# Patient Record
Sex: Female | Born: 1950 | Race: White | Hispanic: No | Marital: Married | Smoking: Former smoker
Health system: Southern US, Community
[De-identification: ages and names within clinical notes are randomized; demographics above are authoritative.]

## PROBLEM LIST (undated history)

## (undated) DIAGNOSIS — K219 Gastro-esophageal reflux disease without esophagitis: Secondary | ICD-10-CM

## (undated) DIAGNOSIS — J069 Acute upper respiratory infection, unspecified: Secondary | ICD-10-CM

## (undated) DIAGNOSIS — I10 Essential (primary) hypertension: Secondary | ICD-10-CM

## (undated) DIAGNOSIS — F1020 Alcohol dependence, uncomplicated: Secondary | ICD-10-CM

## (undated) DIAGNOSIS — E538 Deficiency of other specified B group vitamins: Secondary | ICD-10-CM

## (undated) DIAGNOSIS — IMO0001 Reserved for inherently not codable concepts without codable children: Secondary | ICD-10-CM

## (undated) DIAGNOSIS — F329 Major depressive disorder, single episode, unspecified: Secondary | ICD-10-CM

## (undated) DIAGNOSIS — G4700 Insomnia, unspecified: Secondary | ICD-10-CM

## (undated) DIAGNOSIS — E871 Hypo-osmolality and hyponatremia: Secondary | ICD-10-CM

## (undated) DIAGNOSIS — K5792 Diverticulitis of intestine, part unspecified, without perforation or abscess without bleeding: Secondary | ICD-10-CM

## (undated) DIAGNOSIS — M1990 Unspecified osteoarthritis, unspecified site: Secondary | ICD-10-CM

## (undated) DIAGNOSIS — T39395A Adverse effect of other nonsteroidal anti-inflammatory drugs [NSAID], initial encounter: Secondary | ICD-10-CM

## (undated) DIAGNOSIS — R41841 Cognitive communication deficit: Secondary | ICD-10-CM

## (undated) DIAGNOSIS — K266 Chronic or unspecified duodenal ulcer with both hemorrhage and perforation: Secondary | ICD-10-CM

## (undated) DIAGNOSIS — K269 Duodenal ulcer, unspecified as acute or chronic, without hemorrhage or perforation: Secondary | ICD-10-CM

## (undated) DIAGNOSIS — F32A Depression, unspecified: Secondary | ICD-10-CM

## (undated) DIAGNOSIS — I7300 Raynaud's syndrome without gangrene: Secondary | ICD-10-CM

## (undated) DIAGNOSIS — D509 Iron deficiency anemia, unspecified: Secondary | ICD-10-CM

## (undated) DIAGNOSIS — F411 Generalized anxiety disorder: Secondary | ICD-10-CM

## (undated) DIAGNOSIS — B029 Zoster without complications: Secondary | ICD-10-CM

## (undated) DIAGNOSIS — K5730 Diverticulosis of large intestine without perforation or abscess without bleeding: Secondary | ICD-10-CM

## (undated) DIAGNOSIS — Z5189 Encounter for other specified aftercare: Secondary | ICD-10-CM

## (undated) DIAGNOSIS — M810 Age-related osteoporosis without current pathological fracture: Secondary | ICD-10-CM

## (undated) DIAGNOSIS — R51 Headache: Secondary | ICD-10-CM

## (undated) HISTORY — DX: Age-related osteoporosis without current pathological fracture: M81.0

## (undated) HISTORY — DX: Duodenal ulcer, unspecified as acute or chronic, without hemorrhage or perforation: K26.9

## (undated) HISTORY — PX: UPPER GASTROINTESTINAL ENDOSCOPY: SHX188

## (undated) HISTORY — DX: Adverse effect of other nonsteroidal anti-inflammatory drugs (NSAID), initial encounter: T39.395A

## (undated) HISTORY — DX: Insomnia, unspecified: G47.00

## (undated) HISTORY — DX: Deficiency of other specified B group vitamins: E53.8

## (undated) HISTORY — DX: Iron deficiency anemia, unspecified: D50.9

## (undated) HISTORY — DX: Diverticulitis of intestine, part unspecified, without perforation or abscess without bleeding: K57.92

## (undated) HISTORY — DX: Generalized anxiety disorder: F41.1

## (undated) HISTORY — DX: Gastro-esophageal reflux disease without esophagitis: K21.9

## (undated) HISTORY — DX: Hypo-osmolality and hyponatremia: E87.1

## (undated) HISTORY — DX: Alcohol dependence, uncomplicated: F10.20

## (undated) HISTORY — DX: Essential (primary) hypertension: I10

## (undated) HISTORY — DX: Chronic or unspecified duodenal ulcer with both hemorrhage and perforation: K26.6

## (undated) HISTORY — DX: Raynaud's syndrome without gangrene: I73.00

## (undated) HISTORY — DX: Diverticulosis of large intestine without perforation or abscess without bleeding: K57.30

## (undated) HISTORY — DX: Headache: R51

## (undated) HISTORY — DX: Zoster without complications: B02.9

---
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
## 1975-07-22 HISTORY — PX: TUBAL LIGATION: SHX77

## 1999-03-26 ENCOUNTER — Encounter: Payer: Self-pay | Admitting: Internal Medicine

## 1999-03-26 ENCOUNTER — Ambulatory Visit (HOSPITAL_COMMUNITY): Admission: RE | Admit: 1999-03-26 | Discharge: 1999-03-26 | Payer: Self-pay | Admitting: Internal Medicine

## 2000-12-23 ENCOUNTER — Inpatient Hospital Stay (HOSPITAL_COMMUNITY): Admission: EM | Admit: 2000-12-23 | Discharge: 2000-12-24 | Payer: Self-pay | Admitting: Emergency Medicine

## 2000-12-23 ENCOUNTER — Encounter: Payer: Self-pay | Admitting: Emergency Medicine

## 2001-02-25 ENCOUNTER — Emergency Department (HOSPITAL_COMMUNITY): Admission: EM | Admit: 2001-02-25 | Discharge: 2001-02-26 | Payer: Self-pay | Admitting: Emergency Medicine

## 2001-05-14 ENCOUNTER — Emergency Department (HOSPITAL_COMMUNITY): Admission: EM | Admit: 2001-05-14 | Discharge: 2001-05-15 | Payer: Self-pay | Admitting: Emergency Medicine

## 2004-05-28 ENCOUNTER — Ambulatory Visit: Payer: Self-pay | Admitting: Internal Medicine

## 2004-07-05 ENCOUNTER — Ambulatory Visit: Payer: Self-pay | Admitting: Internal Medicine

## 2004-07-21 DIAGNOSIS — K266 Chronic or unspecified duodenal ulcer with both hemorrhage and perforation: Secondary | ICD-10-CM

## 2004-07-21 HISTORY — DX: Chronic or unspecified duodenal ulcer with both hemorrhage and perforation: K26.6

## 2004-07-30 ENCOUNTER — Ambulatory Visit: Payer: Self-pay | Admitting: Internal Medicine

## 2004-07-30 ENCOUNTER — Inpatient Hospital Stay (HOSPITAL_COMMUNITY): Admission: EM | Admit: 2004-07-30 | Discharge: 2004-08-12 | Payer: Self-pay | Admitting: *Deleted

## 2004-08-03 ENCOUNTER — Encounter (INDEPENDENT_AMBULATORY_CARE_PROVIDER_SITE_OTHER): Payer: Self-pay | Admitting: *Deleted

## 2004-08-03 HISTORY — PX: EXPLORATORY LAPAROTOMY: SUR591

## 2004-08-26 ENCOUNTER — Ambulatory Visit: Payer: Self-pay | Admitting: Internal Medicine

## 2004-10-20 ENCOUNTER — Ambulatory Visit: Payer: Self-pay | Admitting: Internal Medicine

## 2004-10-20 ENCOUNTER — Encounter (INDEPENDENT_AMBULATORY_CARE_PROVIDER_SITE_OTHER): Payer: Self-pay | Admitting: Specialist

## 2004-10-20 ENCOUNTER — Inpatient Hospital Stay (HOSPITAL_COMMUNITY): Admission: EM | Admit: 2004-10-20 | Discharge: 2004-11-03 | Payer: Self-pay | Admitting: Emergency Medicine

## 2004-10-20 HISTORY — PX: EXPLORATORY LAPAROTOMY: SUR591

## 2004-11-23 ENCOUNTER — Emergency Department (HOSPITAL_COMMUNITY): Admission: EM | Admit: 2004-11-23 | Discharge: 2004-11-23 | Payer: Self-pay | Admitting: Emergency Medicine

## 2004-11-27 ENCOUNTER — Ambulatory Visit: Payer: Self-pay | Admitting: Internal Medicine

## 2004-12-18 ENCOUNTER — Ambulatory Visit: Payer: Self-pay | Admitting: Internal Medicine

## 2005-01-31 ENCOUNTER — Ambulatory Visit: Payer: Self-pay | Admitting: Internal Medicine

## 2005-02-28 ENCOUNTER — Ambulatory Visit: Payer: Self-pay | Admitting: Internal Medicine

## 2005-03-31 ENCOUNTER — Ambulatory Visit: Payer: Self-pay | Admitting: Internal Medicine

## 2005-05-12 ENCOUNTER — Ambulatory Visit: Payer: Self-pay | Admitting: Internal Medicine

## 2005-05-21 ENCOUNTER — Ambulatory Visit: Payer: Self-pay | Admitting: Internal Medicine

## 2005-06-30 ENCOUNTER — Ambulatory Visit: Payer: Self-pay | Admitting: Internal Medicine

## 2005-08-11 ENCOUNTER — Ambulatory Visit: Payer: Self-pay | Admitting: Internal Medicine

## 2005-09-25 ENCOUNTER — Ambulatory Visit: Payer: Self-pay | Admitting: Internal Medicine

## 2005-10-20 ENCOUNTER — Ambulatory Visit: Payer: Self-pay | Admitting: Internal Medicine

## 2005-11-10 ENCOUNTER — Ambulatory Visit: Payer: Self-pay | Admitting: Internal Medicine

## 2006-03-20 ENCOUNTER — Ambulatory Visit: Payer: Self-pay | Admitting: Internal Medicine

## 2006-05-11 ENCOUNTER — Ambulatory Visit: Payer: Self-pay | Admitting: Internal Medicine

## 2006-07-09 ENCOUNTER — Ambulatory Visit: Payer: Self-pay | Admitting: Internal Medicine

## 2006-07-27 ENCOUNTER — Ambulatory Visit: Payer: Self-pay | Admitting: Internal Medicine

## 2006-08-20 ENCOUNTER — Ambulatory Visit: Payer: Self-pay | Admitting: Internal Medicine

## 2006-08-20 LAB — CONVERTED CEMR LAB
BUN: 6 mg/dL (ref 6–23)
CO2: 30 meq/L (ref 19–32)
Calcium: 9.6 mg/dL (ref 8.4–10.5)
Chloride: 94 meq/L — ABNORMAL LOW (ref 96–112)
Creatinine, Ser: 0.5 mg/dL (ref 0.4–1.2)
GFR calc Af Amer: 165 mL/min
GFR calc non Af Amer: 137 mL/min
Glucose, Bld: 103 mg/dL — ABNORMAL HIGH (ref 70–99)
Potassium: 5.2 meq/L — ABNORMAL HIGH (ref 3.5–5.1)
Sodium: 132 meq/L — ABNORMAL LOW (ref 135–145)

## 2006-10-19 ENCOUNTER — Ambulatory Visit: Payer: Self-pay | Admitting: Internal Medicine

## 2006-11-23 ENCOUNTER — Ambulatory Visit: Payer: Self-pay | Admitting: Internal Medicine

## 2006-11-27 ENCOUNTER — Encounter: Admission: RE | Admit: 2006-11-27 | Discharge: 2006-11-27 | Payer: Self-pay | Admitting: Internal Medicine

## 2006-11-27 ENCOUNTER — Ambulatory Visit: Payer: Self-pay | Admitting: Internal Medicine

## 2006-11-27 ENCOUNTER — Ambulatory Visit: Payer: Self-pay | Admitting: Cardiology

## 2006-11-27 LAB — CONVERTED CEMR LAB
ALT: 19 units/L (ref 0–40)
AST: 29 units/L (ref 0–37)
Albumin: 4.1 g/dL (ref 3.5–5.2)
Alkaline Phosphatase: 83 units/L (ref 39–117)
BUN: 5 mg/dL — ABNORMAL LOW (ref 6–23)
Basophils Absolute: 0 10*3/uL (ref 0.0–0.1)
Basophils Relative: 0.1 % (ref 0.0–1.0)
Bilirubin, Direct: 0.2 mg/dL (ref 0.0–0.3)
CO2: 29 meq/L (ref 19–32)
Calcium: 8.9 mg/dL (ref 8.4–10.5)
Chloride: 85 meq/L — ABNORMAL LOW (ref 96–112)
Cholesterol: 192 mg/dL (ref 0–200)
Creatinine, Ser: 0.6 mg/dL (ref 0.4–1.2)
Eosinophils Absolute: 0 10*3/uL (ref 0.0–0.6)
Eosinophils Relative: 0 % (ref 0.0–5.0)
GFR calc Af Amer: 134 mL/min
GFR calc non Af Amer: 111 mL/min
Glucose, Bld: 106 mg/dL — ABNORMAL HIGH (ref 70–99)
HCT: 36 % (ref 36.0–46.0)
HDL: 88.8 mg/dL (ref >39.0)
Hemoglobin: 12.4 g/dL (ref 12.0–15.0)
LDL Cholesterol: 93 mg/dL (ref 0–99)
Lymphocytes Relative: 14 % (ref 12.0–46.0)
MCHC: 34.4 g/dL (ref 30.0–36.0)
MCV: 98.6 fL (ref 78.0–100.0)
Monocytes Absolute: 0.4 10*3/uL (ref 0.2–0.7)
Monocytes Relative: 7.1 % (ref 3.0–11.0)
Neutro Abs: 4.2 10*3/uL (ref 1.4–7.7)
Neutrophils Relative %: 78.8 % — ABNORMAL HIGH (ref 43.0–77.0)
Platelets: 346 10*3/uL (ref 150–400)
Potassium: 3.7 meq/L (ref 3.5–5.1)
RBC: 3.65 M/uL — ABNORMAL LOW (ref 3.87–5.11)
RDW: 11.6 % (ref 11.5–14.6)
Sodium: 121 meq/L — ABNORMAL LOW (ref 135–145)
Total Bilirubin: 0.9 mg/dL (ref 0.3–1.2)
Total CHOL/HDL Ratio: 2.2
Total Protein: 6.5 g/dL (ref 6.0–8.3)
Triglycerides: 49 mg/dL (ref 0–149)
VLDL: 10 mg/dL (ref 0–40)
WBC: 5.3 10*3/uL (ref 4.5–10.5)

## 2006-12-04 ENCOUNTER — Ambulatory Visit: Payer: Self-pay | Admitting: Internal Medicine

## 2007-01-18 ENCOUNTER — Ambulatory Visit: Payer: Self-pay

## 2007-01-18 ENCOUNTER — Encounter (INDEPENDENT_AMBULATORY_CARE_PROVIDER_SITE_OTHER): Payer: Self-pay | Admitting: Neurology

## 2007-01-21 DIAGNOSIS — M810 Age-related osteoporosis without current pathological fracture: Secondary | ICD-10-CM

## 2007-01-21 DIAGNOSIS — F411 Generalized anxiety disorder: Secondary | ICD-10-CM

## 2007-01-21 DIAGNOSIS — F1020 Alcohol dependence, uncomplicated: Secondary | ICD-10-CM

## 2007-01-21 HISTORY — DX: Generalized anxiety disorder: F41.1

## 2007-01-21 HISTORY — DX: Age-related osteoporosis without current pathological fracture: M81.0

## 2007-01-25 ENCOUNTER — Encounter: Admission: RE | Admit: 2007-01-25 | Discharge: 2007-01-25 | Payer: Self-pay | Admitting: Neurology

## 2007-04-19 ENCOUNTER — Encounter: Payer: Self-pay | Admitting: Internal Medicine

## 2007-05-07 ENCOUNTER — Encounter: Payer: Self-pay | Admitting: Internal Medicine

## 2007-05-20 ENCOUNTER — Ambulatory Visit: Payer: Self-pay | Admitting: Internal Medicine

## 2007-05-27 ENCOUNTER — Telehealth: Payer: Self-pay | Admitting: Internal Medicine

## 2007-05-31 ENCOUNTER — Ambulatory Visit: Payer: Self-pay | Admitting: Internal Medicine

## 2007-05-31 DIAGNOSIS — R51 Headache: Secondary | ICD-10-CM | POA: Insufficient documentation

## 2007-05-31 DIAGNOSIS — R519 Headache, unspecified: Secondary | ICD-10-CM | POA: Insufficient documentation

## 2007-05-31 DIAGNOSIS — G4700 Insomnia, unspecified: Secondary | ICD-10-CM

## 2007-05-31 HISTORY — DX: Headache: R51

## 2007-05-31 HISTORY — DX: Insomnia, unspecified: G47.00

## 2007-06-01 LAB — CONVERTED CEMR LAB
BUN: 10 mg/dL (ref 6–23)
Basophils Absolute: 0 10*3/uL (ref 0.0–0.1)
Basophils Relative: 0.5 % (ref 0.0–1.0)
CO2: 29 meq/L (ref 19–32)
Calcium: 9.4 mg/dL (ref 8.4–10.5)
Chloride: 90 meq/L — ABNORMAL LOW (ref 96–112)
Creatinine, Ser: 0.6 mg/dL (ref 0.4–1.2)
Eosinophils Absolute: 0.1 10*3/uL (ref 0.0–0.6)
Eosinophils Relative: 0.9 % (ref 0.0–5.0)
GFR calc Af Amer: 133 mL/min
GFR calc non Af Amer: 110 mL/min
Glucose, Bld: 94 mg/dL (ref 70–99)
HCT: 36.6 % (ref 36.0–46.0)
Hemoglobin: 12.8 g/dL (ref 12.0–15.0)
Lymphocytes Relative: 27.1 % (ref 12.0–46.0)
MCHC: 35 g/dL (ref 30.0–36.0)
MCV: 97.4 fL (ref 78.0–100.0)
Monocytes Absolute: 0.5 10*3/uL (ref 0.2–0.7)
Monocytes Relative: 7.8 % (ref 3.0–11.0)
Neutro Abs: 4.1 10*3/uL (ref 1.4–7.7)
Neutrophils Relative %: 63.7 % (ref 43.0–77.0)
Platelets: 330 10*3/uL (ref 150–400)
Potassium: 5 meq/L (ref 3.5–5.1)
RBC: 3.75 M/uL — ABNORMAL LOW (ref 3.87–5.11)
RDW: 12.6 % (ref 11.5–14.6)
Sodium: 127 meq/L — ABNORMAL LOW (ref 135–145)
TSH: 0.77 microintl units/mL (ref 0.35–5.50)
WBC: 6.5 10*3/uL (ref 4.5–10.5)

## 2007-06-02 ENCOUNTER — Telehealth: Payer: Self-pay | Admitting: Internal Medicine

## 2007-06-16 ENCOUNTER — Ambulatory Visit: Payer: Self-pay | Admitting: Endocrinology

## 2007-06-16 ENCOUNTER — Inpatient Hospital Stay (HOSPITAL_COMMUNITY): Admission: EM | Admit: 2007-06-16 | Discharge: 2007-06-19 | Payer: Self-pay | Admitting: Emergency Medicine

## 2007-06-18 ENCOUNTER — Encounter: Payer: Self-pay | Admitting: Internal Medicine

## 2007-06-18 HISTORY — PX: COLONOSCOPY: SHX174

## 2007-06-22 ENCOUNTER — Ambulatory Visit: Payer: Self-pay | Admitting: Internal Medicine

## 2007-06-23 ENCOUNTER — Ambulatory Visit: Payer: Self-pay | Admitting: Internal Medicine

## 2007-06-23 DIAGNOSIS — I10 Essential (primary) hypertension: Secondary | ICD-10-CM | POA: Insufficient documentation

## 2007-06-23 DIAGNOSIS — I7300 Raynaud's syndrome without gangrene: Secondary | ICD-10-CM

## 2007-06-23 HISTORY — DX: Essential (primary) hypertension: I10

## 2007-06-23 HISTORY — DX: Raynaud's syndrome without gangrene: I73.00

## 2007-07-07 ENCOUNTER — Ambulatory Visit: Payer: Self-pay | Admitting: Internal Medicine

## 2007-07-09 ENCOUNTER — Ambulatory Visit: Payer: Self-pay | Admitting: Internal Medicine

## 2007-07-09 ENCOUNTER — Encounter: Payer: Self-pay | Admitting: Internal Medicine

## 2007-07-09 DIAGNOSIS — K5730 Diverticulosis of large intestine without perforation or abscess without bleeding: Secondary | ICD-10-CM | POA: Insufficient documentation

## 2007-07-09 DIAGNOSIS — D509 Iron deficiency anemia, unspecified: Secondary | ICD-10-CM

## 2007-07-09 HISTORY — DX: Diverticulosis of large intestine without perforation or abscess without bleeding: K57.30

## 2007-07-09 LAB — CONVERTED CEMR LAB
ALT: 14 units/L (ref 0–35)
Albumin: 4 g/dL (ref 3.5–5.2)
Alkaline Phosphatase: 79 units/L (ref 39–117)
Basophils Absolute: 0.1 10*3/uL (ref 0.0–0.1)
Basophils Relative: 1 % (ref 0.0–1.0)
HCT: 34.2 % — ABNORMAL LOW (ref 36.0–46.0)
Hemoglobin: 11.7 g/dL — ABNORMAL LOW (ref 12.0–15.0)
MCHC: 34.1 g/dL (ref 30.0–36.0)
Monocytes Absolute: 0.4 10*3/uL (ref 0.2–0.7)
Monocytes Relative: 5.5 % (ref 3.0–11.0)
Neutrophils Relative %: 77.7 % — ABNORMAL HIGH (ref 43.0–77.0)
RBC: 3.51 M/uL — ABNORMAL LOW (ref 3.87–5.11)
RDW: 12.7 % (ref 11.5–14.6)
Saturation Ratios: 16.4 % — ABNORMAL LOW (ref 20.0–50.0)
Total Protein: 6.2 g/dL (ref 6.0–8.3)
Transferrin: 317.4 mg/dL (ref >=212.0)
Vitamin B-12: 341 pg/mL (ref 211–911)

## 2007-09-03 ENCOUNTER — Ambulatory Visit: Payer: Self-pay | Admitting: Internal Medicine

## 2007-09-03 LAB — CONVERTED CEMR LAB
Basophils Absolute: 0 10*3/uL (ref 0.0–0.1)
Basophils Relative: 0.7 % (ref 0.0–1.0)
CO2: 32 meq/L (ref 19–32)
Chloride: 98 meq/L (ref 96–112)
Creatinine, Ser: 0.5 mg/dL (ref 0.4–1.2)
Eosinophils Relative: 1.4 % (ref 0.0–5.0)
Ferritin: 5.2 ng/mL — ABNORMAL LOW (ref 10.0–291.0)
Glucose, Bld: 92 mg/dL (ref 70–99)
HCT: 40.4 % (ref 36.0–46.0)
Hemoglobin: 13.3 g/dL (ref 12.0–15.0)
Monocytes Absolute: 0.3 10*3/uL (ref 0.2–0.7)
Neutrophils Relative %: 61.8 % (ref 43.0–77.0)
Potassium: 4.7 meq/L (ref 3.5–5.1)
RBC: 4.09 M/uL (ref 3.87–5.11)
RDW: 13.2 % (ref 11.5–14.6)
Sodium: 136 meq/L (ref 135–145)
WBC: 7 10*3/uL (ref 4.5–10.5)

## 2007-09-10 ENCOUNTER — Ambulatory Visit: Payer: Self-pay | Admitting: Cardiology

## 2007-09-15 ENCOUNTER — Ambulatory Visit: Payer: Self-pay | Admitting: Internal Medicine

## 2007-09-16 ENCOUNTER — Ambulatory Visit: Payer: Self-pay | Admitting: Internal Medicine

## 2007-09-16 ENCOUNTER — Encounter: Payer: Self-pay | Admitting: Internal Medicine

## 2007-09-21 ENCOUNTER — Encounter (HOSPITAL_COMMUNITY): Admission: RE | Admit: 2007-09-21 | Discharge: 2007-12-06 | Payer: Self-pay | Admitting: Internal Medicine

## 2008-03-13 ENCOUNTER — Encounter: Admission: RE | Admit: 2008-03-13 | Discharge: 2008-03-13 | Payer: Self-pay | Admitting: Neurosurgery

## 2008-03-16 ENCOUNTER — Encounter: Payer: Self-pay | Admitting: Internal Medicine

## 2008-03-23 ENCOUNTER — Ambulatory Visit: Payer: Self-pay | Admitting: Internal Medicine

## 2008-04-20 ENCOUNTER — Ambulatory Visit: Payer: Self-pay | Admitting: Internal Medicine

## 2008-05-21 ENCOUNTER — Emergency Department (HOSPITAL_COMMUNITY): Admission: EM | Admit: 2008-05-21 | Discharge: 2008-05-21 | Payer: Self-pay | Admitting: Emergency Medicine

## 2008-08-21 ENCOUNTER — Ambulatory Visit: Payer: Self-pay | Admitting: Internal Medicine

## 2008-08-29 ENCOUNTER — Ambulatory Visit: Payer: Self-pay | Admitting: Internal Medicine

## 2008-08-30 LAB — CONVERTED CEMR LAB
ALT: 13 units/L (ref 0–35)
Amylase: 83 units/L (ref 27–131)
Eosinophils Absolute: 0 10*3/uL (ref 0.0–0.7)
HCT: 39.9 % (ref 36.0–46.0)
Hemoglobin: 14 g/dL (ref 12.0–15.0)
MCV: 99.1 fL (ref 78.0–100.0)
Monocytes Absolute: 0.5 10*3/uL (ref 0.1–1.0)
Monocytes Relative: 6 % (ref 3.0–12.0)
Neutro Abs: 7.2 10*3/uL (ref 1.4–7.7)
RDW: 13.5 % (ref 11.5–14.6)
Total Bilirubin: 1 mg/dL (ref 0.3–1.2)
Total Protein: 6.2 g/dL (ref 6.0–8.3)

## 2008-08-31 ENCOUNTER — Ambulatory Visit: Payer: Self-pay | Admitting: Internal Medicine

## 2008-09-11 ENCOUNTER — Encounter: Payer: Self-pay | Admitting: Internal Medicine

## 2008-09-11 ENCOUNTER — Telehealth: Payer: Self-pay | Admitting: Internal Medicine

## 2008-09-25 ENCOUNTER — Telehealth: Payer: Self-pay | Admitting: Internal Medicine

## 2008-09-26 ENCOUNTER — Ambulatory Visit: Payer: Self-pay | Admitting: Internal Medicine

## 2008-09-26 ENCOUNTER — Encounter (INDEPENDENT_AMBULATORY_CARE_PROVIDER_SITE_OTHER): Payer: Self-pay | Admitting: *Deleted

## 2008-10-02 ENCOUNTER — Ambulatory Visit: Payer: Self-pay | Admitting: Internal Medicine

## 2008-11-19 ENCOUNTER — Emergency Department (HOSPITAL_COMMUNITY): Admission: EM | Admit: 2008-11-19 | Discharge: 2008-11-19 | Payer: Self-pay | Admitting: Emergency Medicine

## 2008-11-20 ENCOUNTER — Telehealth: Payer: Self-pay | Admitting: Internal Medicine

## 2008-11-21 ENCOUNTER — Ambulatory Visit: Payer: Self-pay | Admitting: Internal Medicine

## 2008-11-21 DIAGNOSIS — R109 Unspecified abdominal pain: Secondary | ICD-10-CM | POA: Insufficient documentation

## 2008-12-06 ENCOUNTER — Telehealth: Payer: Self-pay | Admitting: Internal Medicine

## 2008-12-06 ENCOUNTER — Encounter: Payer: Self-pay | Admitting: Internal Medicine

## 2008-12-15 ENCOUNTER — Telehealth: Payer: Self-pay | Admitting: Internal Medicine

## 2008-12-15 ENCOUNTER — Emergency Department (HOSPITAL_COMMUNITY): Admission: EM | Admit: 2008-12-15 | Discharge: 2008-12-15 | Payer: Self-pay | Admitting: Emergency Medicine

## 2008-12-15 ENCOUNTER — Encounter: Payer: Self-pay | Admitting: Internal Medicine

## 2008-12-19 ENCOUNTER — Ambulatory Visit: Payer: Self-pay | Admitting: Gastroenterology

## 2008-12-19 DIAGNOSIS — R112 Nausea with vomiting, unspecified: Secondary | ICD-10-CM

## 2008-12-19 DIAGNOSIS — K269 Duodenal ulcer, unspecified as acute or chronic, without hemorrhage or perforation: Secondary | ICD-10-CM | POA: Insufficient documentation

## 2008-12-21 ENCOUNTER — Encounter (INDEPENDENT_AMBULATORY_CARE_PROVIDER_SITE_OTHER): Payer: Self-pay | Admitting: *Deleted

## 2008-12-21 ENCOUNTER — Ambulatory Visit: Payer: Self-pay | Admitting: Internal Medicine

## 2008-12-25 ENCOUNTER — Encounter: Payer: Self-pay | Admitting: Internal Medicine

## 2008-12-25 LAB — CONVERTED CEMR LAB
Eosinophils Relative: 1.1 % (ref 0.0–5.0)
Folate: >20 ng/mL
HCT: 29.2 % — ABNORMAL LOW (ref 36.0–46.0)
Iron: 16 ug/dL — ABNORMAL LOW (ref 42–145)
Monocytes Relative: 2 % — ABNORMAL LOW (ref 3.0–12.0)
Neutrophils Relative %: 78.1 % — ABNORMAL HIGH (ref 43.0–77.0)
Platelets: 408 10*3/uL — ABNORMAL HIGH (ref 150.0–400.0)
RBC: 3.37 M/uL — ABNORMAL LOW (ref 3.87–5.11)
Transferrin: 326.8 mg/dL (ref 212.0–360.0)
Vitamin B-12: 725 pg/mL (ref 211–911)
WBC: 6.9 10*3/uL (ref 4.5–10.5)

## 2009-01-03 ENCOUNTER — Encounter (HOSPITAL_COMMUNITY): Admission: RE | Admit: 2009-01-03 | Discharge: 2009-01-17 | Payer: Self-pay | Admitting: Internal Medicine

## 2009-01-03 ENCOUNTER — Telehealth: Payer: Self-pay | Admitting: Internal Medicine

## 2009-01-08 ENCOUNTER — Ambulatory Visit: Payer: Self-pay | Admitting: Internal Medicine

## 2009-01-08 ENCOUNTER — Telehealth: Payer: Self-pay | Admitting: Internal Medicine

## 2009-01-09 ENCOUNTER — Telehealth: Payer: Self-pay | Admitting: Internal Medicine

## 2009-01-10 ENCOUNTER — Encounter: Payer: Self-pay | Admitting: Internal Medicine

## 2009-01-17 ENCOUNTER — Telehealth: Payer: Self-pay | Admitting: Internal Medicine

## 2009-01-21 ENCOUNTER — Inpatient Hospital Stay (HOSPITAL_COMMUNITY): Admission: EM | Admit: 2009-01-21 | Discharge: 2009-01-26 | Payer: Self-pay | Admitting: Emergency Medicine

## 2009-01-30 ENCOUNTER — Telehealth: Payer: Self-pay | Admitting: Internal Medicine

## 2009-01-31 ENCOUNTER — Telehealth: Payer: Self-pay | Admitting: Internal Medicine

## 2009-02-02 ENCOUNTER — Ambulatory Visit: Payer: Self-pay | Admitting: Family Medicine

## 2009-02-02 DIAGNOSIS — F0781 Postconcussional syndrome: Secondary | ICD-10-CM | POA: Insufficient documentation

## 2009-02-02 DIAGNOSIS — E871 Hypo-osmolality and hyponatremia: Secondary | ICD-10-CM

## 2009-02-02 DIAGNOSIS — F1010 Alcohol abuse, uncomplicated: Secondary | ICD-10-CM | POA: Insufficient documentation

## 2009-02-02 DIAGNOSIS — F1020 Alcohol dependence, uncomplicated: Secondary | ICD-10-CM

## 2009-02-02 HISTORY — DX: Hypo-osmolality and hyponatremia: E87.1

## 2009-02-02 HISTORY — DX: Alcohol dependence, uncomplicated: F10.20

## 2009-02-04 ENCOUNTER — Ambulatory Visit: Payer: Self-pay | Admitting: Internal Medicine

## 2009-02-04 ENCOUNTER — Inpatient Hospital Stay (HOSPITAL_COMMUNITY): Admission: EM | Admit: 2009-02-04 | Discharge: 2009-02-08 | Payer: Self-pay | Admitting: Emergency Medicine

## 2009-02-05 ENCOUNTER — Telehealth: Payer: Self-pay | Admitting: Internal Medicine

## 2009-02-05 ENCOUNTER — Telehealth: Payer: Self-pay | Admitting: Family Medicine

## 2009-02-05 LAB — CONVERTED CEMR LAB
BUN: 7 mg/dL (ref 6–23)
Calcium: 8.7 mg/dL (ref 8.4–10.5)
Creatinine, Ser: 0.4 mg/dL (ref 0.4–1.2)
GFR calc non Af Amer: 174.91 mL/min (ref >60)

## 2009-02-12 ENCOUNTER — Telehealth: Payer: Self-pay | Admitting: Internal Medicine

## 2009-02-27 ENCOUNTER — Ambulatory Visit: Payer: Self-pay | Admitting: Internal Medicine

## 2009-02-28 ENCOUNTER — Ambulatory Visit: Payer: Self-pay | Admitting: Internal Medicine

## 2009-03-02 LAB — CONVERTED CEMR LAB
Calcium: 8.8 mg/dL (ref 8.4–10.5)
GFR calc non Af Amer: 174.87 mL/min (ref >60)
Sodium: 139 meq/L (ref 135–145)

## 2009-05-05 ENCOUNTER — Emergency Department (HOSPITAL_COMMUNITY): Admission: EM | Admit: 2009-05-05 | Discharge: 2009-05-05 | Payer: Self-pay | Admitting: Emergency Medicine

## 2009-06-22 ENCOUNTER — Ambulatory Visit: Payer: Self-pay | Admitting: Internal Medicine

## 2009-08-01 ENCOUNTER — Ambulatory Visit: Payer: Self-pay | Admitting: Internal Medicine

## 2009-08-01 DIAGNOSIS — J069 Acute upper respiratory infection, unspecified: Secondary | ICD-10-CM | POA: Insufficient documentation

## 2009-09-25 ENCOUNTER — Telehealth: Payer: Self-pay | Admitting: Internal Medicine

## 2009-09-26 ENCOUNTER — Ambulatory Visit: Payer: Self-pay | Admitting: Internal Medicine

## 2009-09-26 DIAGNOSIS — K912 Postsurgical malabsorption, not elsewhere classified: Secondary | ICD-10-CM

## 2009-09-28 LAB — CONVERTED CEMR LAB
Basophils Absolute: 0.1 10*3/uL (ref 0.0–0.1)
Hemoglobin: 10 g/dL — ABNORMAL LOW (ref 12.0–15.0)
Lymphocytes Relative: 24.6 % (ref 12.0–46.0)
Monocytes Relative: 11.7 % (ref 3.0–12.0)
Neutro Abs: 4.1 10*3/uL (ref 1.4–7.7)
RBC: 3.12 M/uL — ABNORMAL LOW (ref 3.87–5.11)
RDW: 13.9 % (ref 11.5–14.6)

## 2009-10-02 ENCOUNTER — Ambulatory Visit: Payer: Self-pay | Admitting: Internal Medicine

## 2009-10-02 DIAGNOSIS — E538 Deficiency of other specified B group vitamins: Secondary | ICD-10-CM | POA: Insufficient documentation

## 2009-10-02 HISTORY — DX: Deficiency of other specified B group vitamins: E53.8

## 2009-10-08 ENCOUNTER — Encounter (HOSPITAL_COMMUNITY): Admission: RE | Admit: 2009-10-08 | Discharge: 2010-01-06 | Payer: Self-pay | Admitting: Internal Medicine

## 2009-10-08 ENCOUNTER — Encounter: Payer: Self-pay | Admitting: Internal Medicine

## 2010-01-02 ENCOUNTER — Ambulatory Visit: Payer: Self-pay | Admitting: Internal Medicine

## 2010-03-15 ENCOUNTER — Ambulatory Visit: Payer: Self-pay | Admitting: Internal Medicine

## 2010-03-26 ENCOUNTER — Ambulatory Visit: Payer: Self-pay | Admitting: Internal Medicine

## 2010-05-20 ENCOUNTER — Telehealth: Payer: Self-pay | Admitting: Internal Medicine

## 2010-05-21 ENCOUNTER — Telehealth: Payer: Self-pay | Admitting: Internal Medicine

## 2010-05-21 ENCOUNTER — Ambulatory Visit: Payer: Self-pay | Admitting: Internal Medicine

## 2010-05-21 LAB — CONVERTED CEMR LAB
Basophils Relative: 0.8 % (ref 0.0–3.0)
Eosinophils Relative: 3.6 % (ref 0.0–5.0)
Ferritin: 2.6 ng/mL — ABNORMAL LOW (ref 10.0–291.0)
Lymphocytes Relative: 23 % (ref 12.0–46.0)
MCV: 95.9 fL (ref 78.0–100.0)
Monocytes Absolute: 0.5 10*3/uL (ref 0.1–1.0)
Neutrophils Relative %: 64.5 % (ref 43.0–77.0)
RBC: 3.62 M/uL — ABNORMAL LOW (ref 3.87–5.11)
TSH: 0.73 microintl units/mL (ref 0.35–5.50)
WBC: 6.5 10*3/uL (ref 4.5–10.5)

## 2010-05-28 ENCOUNTER — Telehealth (INDEPENDENT_AMBULATORY_CARE_PROVIDER_SITE_OTHER): Payer: Self-pay

## 2010-05-28 ENCOUNTER — Ambulatory Visit: Admission: RE | Admit: 2010-05-28 | Discharge: 2010-05-28 | Payer: Self-pay | Admitting: Internal Medicine

## 2010-06-04 ENCOUNTER — Ambulatory Visit (HOSPITAL_COMMUNITY)
Admission: RE | Admit: 2010-06-04 | Discharge: 2010-06-04 | Payer: Self-pay | Source: Home / Self Care | Admitting: Internal Medicine

## 2010-06-04 ENCOUNTER — Encounter: Payer: Self-pay | Admitting: Internal Medicine

## 2010-06-17 ENCOUNTER — Ambulatory Visit: Payer: Self-pay | Admitting: Internal Medicine

## 2010-07-02 ENCOUNTER — Ambulatory Visit: Payer: Self-pay | Admitting: Internal Medicine

## 2010-07-16 ENCOUNTER — Ambulatory Visit: Payer: Self-pay | Admitting: Internal Medicine

## 2010-07-21 DIAGNOSIS — B029 Zoster without complications: Secondary | ICD-10-CM

## 2010-07-21 HISTORY — DX: Zoster without complications: B02.9

## 2010-07-23 LAB — CONVERTED CEMR LAB
Basophils Relative: 1.5 % (ref 0.0–3.0)
Eosinophils Relative: 1.3 % (ref 0.0–5.0)
Hemoglobin: 13.7 g/dL (ref 12.0–15.0)
Lymphocytes Relative: 22.2 % (ref 12.0–46.0)
Monocytes Relative: 5.7 % (ref 3.0–12.0)
Neutro Abs: 3.9 10*3/uL (ref 1.4–7.7)
RBC: 3.95 M/uL (ref 3.87–5.11)
WBC: 5.7 10*3/uL (ref 4.5–10.5)

## 2010-07-25 ENCOUNTER — Ambulatory Visit
Admission: RE | Admit: 2010-07-25 | Discharge: 2010-07-25 | Payer: Self-pay | Source: Home / Self Care | Attending: Internal Medicine | Admitting: Internal Medicine

## 2010-08-19 ENCOUNTER — Ambulatory Visit
Admission: RE | Admit: 2010-08-19 | Discharge: 2010-08-19 | Payer: Self-pay | Source: Home / Self Care | Attending: Internal Medicine | Admitting: Internal Medicine

## 2010-08-19 DIAGNOSIS — B029 Zoster without complications: Secondary | ICD-10-CM | POA: Insufficient documentation

## 2010-08-20 NOTE — Progress Notes (Signed)
Summary: Triage   Phone Note Call from Patient Call back at [phone]   Caller: Patient Call For: Dr. [name] Reason for Call: Talk to Nurse Summary of Call: Pt feels like her Iron is low. Wants to know if she can get an appt. for bloodwork Initial call taken by: Karna Christmas,  September 25, 2009 8:23 AM  Follow-up for Phone Call        Patient  staets she missed her REV and she feels like she is iron def again.  patient will come in and see Dr [name] tomorrow at 3:30 Follow-up by: Darcey Nora RN, CGRN,  September 25, 2009 9:35 AM

## 2010-08-20 NOTE — Progress Notes (Signed)
Summary: Elevated BP OK for feraheme?   Phone Note From Other Clinic   Caller: Tammy - Short Stay at [facility] Call For: Dr [name] Summary of Call: Patient  is at short stay for her first injection on Feraheme.  Upon arrival patient BP 180/87 they had her sit and checked it again after 15 minutes  was 180/87.  Patient  has a hx of HTN, but has been off of her meds for years due to good BP control.  I checked with [facility] there are no warnings or contraindications to giving Feraheme to HTN patients.  Per Dr Jarold Motto ok for injection.  Tammy at short stay will advise the patient to see her primary care about elevated BP.   Initial call taken by: Darcey Nora RN, CGRN,  May 28, 2010 11:46 AM

## 2010-08-20 NOTE — Assessment & Plan Note (Signed)
Summary: INSOMNIA // RS   Vital Signs:  Patient profile:   60 year old female Weight:      100 pounds Temp:     98.2 degrees F oral BP sitting:   160 / 70  (right arm) Cuff size:   regular  Vitals Entered By: Duard Brady LPN (March 26, 2010 1:45 PM) CC: c/o insomnia Is Patient Diabetic? No  Flu Vaccine Consent Questions     Do you have a history of severe allergic reactions to this vaccine? no    Any prior history of allergic reactions to egg and/or gelatin? no    Do you have a sensitivity to the preservative Thimersol? no    Do you have a past history of Guillan-Barre Syndrome? no    Do you currently have an acute febrile illness? no    Have you ever had a severe reaction to latex? no    Vaccine information given and explained to patient? yes    Are you currently pregnant? no    Lot Number:[id number]   Exp Date:01/18/2011   Site Given  Left Deltoid IM  Primary Care Pearlina Friedly:  Gordy Savers  MD  CC:  c/o insomnia.  History of Present Illness: 60 year old patient who is seen today for follow-up.  she is followed by GI for on deficiency anemia and has had the upper endoscopy and colonoscopy.  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  She has had two iron transfusions.  She is on quarterly B12 injections for borderline low B12 deficiency.  She has a history of migraine headaches, which have been stable.  She has mild anxiety disorder.  She continues to have some insomnia issues, but she gets by with alprazolam, just twice weekly.  She has a history of hypertension, presently controlled without medications  Preventive Screening-Counseling & Management  Alcohol-Tobacco     Smoking Status: never  Allergies: 1)  ! Penicillin V Potassium (Penicillin V Potassium) 2)  ! * Dilaudid 3)  ! Sulfa  Past History:  Past Medical History: Anxiety Osteoporosis PUD 2006 requiring resection Hypertension Diverticulosis, severe in  sigmoid Headaches Prior EtOH abuse Anemia-iron deficiency  Past Surgical History: Reviewed history from 07/09/2007 and no changes required. Tubal ligation, bilateral 1977 gastric ulcer, EGD  2006, perforated, s/p gastrojejunostomy and oversew  Review of Systems  The patient denies anorexia, fever, weight loss, weight gain, vision loss, decreased hearing, hoarseness, chest pain, syncope, dyspnea on exertion, peripheral edema, prolonged cough, headaches, hemoptysis, abdominal pain, melena, hematochezia, severe indigestion/heartburn, hematuria, incontinence, genital sores, muscle weakness, suspicious skin lesions, transient blindness, difficulty walking, depression, unusual weight change, abnormal bleeding, enlarged lymph nodes, angioedema, and breast masses.    Physical Exam  General:  underweight appearing.  140/80underweight appearing.   Head:  Normocephalic and atraumatic without obvious abnormalities. No apparent alopecia or balding. Eyes:  No corneal or conjunctival inflammation noted. EOMI. Perrla. Funduscopic exam benign, without hemorrhages, exudates or papilledema. Vision grossly normal. Mouth:  Oral mucosa and oropharynx without lesions or exudates.  Teeth in good repair. Neck:  No deformities, masses, or tenderness noted. Lungs:  Normal respiratory effort, chest expands symmetrically. Lungs are clear to auscultation, no crackles or wheezes. Heart:  Normal rate and regular rhythm. S1 and S2 normal without gallop, murmur, click, rub or other extra sounds. Abdomen:  Bowel sounds positive,abdomen soft and non-tender without masses, organomegaly or hernias noted. Msk:  No deformity or scoliosis noted of thoracic or lumbar spine.  Impression & Recommendations:  Problem # 1:  ANEMIA-IRON DEFICIENCY (ICD-280.9)  Problem # 2:  VITAMIN B12 DEFICIENCY (ICD-266.2)  Problem # 3:  INSOMNIA (ICD-780.52)  Complete Medication List: 1)  Miralax Powd (Polyethylene glycol 3350) .Marland KitchenMarland Kitchen. 17 gm  in water every third day 2)  Hyoscyamine Sulfate Cr 0.375 Mg Tb12 (Hyoscyamine sulfate) .... Two times a day as needed 3)  Alprazolam 0.25 Mg Tabs (Alprazolam) .... One tablet at bedtime as needed for sleep  Other Orders: Admin 1st Vaccine (16109) Flu Vaccine 65yrs + (60454)  Patient Instructions: 1)  Please schedule a follow-up appointment in 4 months. 2)  Limit your Sodium (Salt) to less than 2 grams a day(slightly less than 1/2 a teaspoon) to prevent fluid retention, swelling, or worsening of symptoms. 3)  It is important that you exercise regularly at least 20 minutes 5 times a week. If you develop chest pain, have severe difficulty breathing, or feel very tired , stop exercising immediately and seek medical attention. 4)  Check your Blood Pressure regularly. If it is above: 150/90 you should make an appointment. Prescriptions: ALPRAZOLAM 0.25 MG TABS (ALPRAZOLAM) one tablet at bedtime as needed for sleep  #50 x 3   Entered and Authorized by:   Gordy Savers  MD   Signed by:   Gordy Savers  MD on 03/26/2010   Method used:   Print then Give to Patient   RxID:   [id number]

## 2010-08-20 NOTE — Progress Notes (Signed)
Summary: needs and iron ifusion and labs 6 weeks after   Phone Note Outgoing Call   Summary of Call: Let her know Hgb ok as is B12 but ferritin (iron) is low). May explain fatigue or part of it. Doubt it explains light-headed ness she needs an iron infusion due to low iron, (fe defic anemia and malabsorptive issues) and needs a CBC and ferritin 6 weeks after (she did not get cbc 1 month after last infuison) Iva Boop MD, [facility]  May 21, 2010 4:04 PM   Follow-up for Phone Call        Left message for patient to call back Darcey Nora RN, [facility]  May 21, 2010 4:14 PM  Discussed with Dr [name] patient to try Feraheme 510 mg IV push and repeat in 1 week.  Patient  is scheduled at [facility] for 05/28/10 10:30 for the first IV push. New labs entered for 07/16/10.   Patient  aware of the above.  Follow-up by: Darcey Nora RN, CGRN,  May 22, 2010 10:39 AM

## 2010-08-20 NOTE — Progress Notes (Signed)
Summary: labs?   Phone Note Call from Patient Call back at Work Phone [phone]   Caller: husband, Leonette Most Call For: Dr. [name] Reason for Call: Talk to Nurse Summary of Call: thinks pt may need labs or earlier B12 injection... feeling like her iron level is low Initial call taken by: Vallarie Mare,  May 20, 2010 8:54 AM  Follow-up for Phone Call        Patient states she feels, weak, "woozy" and nausea at times, and tired.  Feels it is due to  "low iron, I need my B12 injection early".  I explained to the patient that the B12 injections she gets are for a low B12 level not for her low iron levels, but she also did have a low ferritin level and that is why she had to get an iron Infusion.    She is not taking any oral iron and her last Infed infusion was in 09/2009.  Please advise if needs labs now.  She is aware that we will call her back tomorrow when Dr [name] returns to the office.  Follow-up by: Darcey Nora RN, CGRN,  May 20, 2010 9:26 AM  Additional Follow-up for Phone Call Additional follow up Details #1::        CBC, ferritin, B12 level, TSH dx: 280.9, B12 deficiency and weakness Additional Follow-up by: Iva Boop MD, Clementeen Graham,  May 21, 2010 6:11 AM    Additional Follow-up for Phone Call Additional follow up Details #2::    Patient  aware she will come for lab work today or tomorrow. Follow-up by: Darcey Nora RN, CGRN,  May 21, 2010 8:49 AM

## 2010-08-20 NOTE — Assessment & Plan Note (Signed)
Summary: b12 now then again in 3 months/sheri  Nurse Visit   Allergies: 1)  ! Penicillin V Potassium (Penicillin V Potassium) 2)  ! * Dilaudid 3)  ! Sulfa  Medication Administration  Injection # 1:    Medication: Vit B12 1000 mcg    Diagnosis: VITAMIN B12 DEFICIENCY (ICD-266.2)    Route: IM    Site: L deltoid    Exp Date: 05/22/2011    Lot #: [id number]    Mfr: [employer]    Patient tolerated injection without complications    Given by: Christie Nottingham CMA Duncan Dull) (October 02, 2009 3:09 PM)  Orders Added: 1)  Vit B12 1000 mcg [J3420]

## 2010-08-20 NOTE — Assessment & Plan Note (Signed)
Summary: last b-12 inj/all  Nurse Visit   Medication Administration  Injection # 1:    Medication: Vit B12 1000 mcg    Diagnosis: VITAMIN B12 DEFICIENCY (ICD-266.2)    Route: IM    Site: L deltoid    Exp Date: 08/2011    Lot #: [id number]    Mfr: [employer]    Comments: pt will return in 3 months for next injection.    Patient tolerated injection without complications    Given by: Francee Piccolo CMA Duncan Dull) (January 02, 2010 12:09 PM)  Orders Added: 1)  Vit B12 1000 mcg [J3420]

## 2010-08-20 NOTE — Assessment & Plan Note (Signed)
Summary: ROA//LH   Vital Signs:  Patient profile:   60 year old female Weight:      102 pounds Temp:     98 degrees F oral BP sitting:   130 / 66  (left arm) Cuff size:   regular  Vitals Entered By: Raechel Ache, RN (August 01, 2009 9:40 AM) CC: ROV, c/o cough and chest congestion.   Primary Care Provider:  Birdie Sons, MD  CC:  ROV and c/o cough and chest congestion.Marland Kitchen  History of Present Illness: 60 year old patient who is seen today for follow-up.  She has a history of hypertension, osteoporosis, chronic anxiety and insomnia.  She has headaches.  She presents today with a 3-day history of some chest congestion and cough productive of small amount of nonpurulent sputum.  His been no fever, chills, or chest pain.  She has had some occasional wheezing.  She has bronchodilator therapy in the past ( Symbicort) which he states has been quite helpful.  Allergies: 1)  ! Penicillin V Potassium (Penicillin V Potassium) 2)  ! * Dilaudid 3)  ! Sulfa  Past History:  Past Medical History: Reviewed history from 12/19/2008 and no changes required. Anxiety Osteoporosis PUD 2006 requiring resection Hypertension Diverticulosis, severe in sigmoid Headaches Prior EtOH abuse  Family History: Reviewed history from 01/08/2009 and no changes required. No FH of Colon Cancer: No family history of any cancers   Review of Systems       The patient complains of prolonged cough.  The patient denies anorexia, fever, weight loss, weight gain, vision loss, decreased hearing, hoarseness, chest pain, syncope, dyspnea on exertion, peripheral edema, headaches, hemoptysis, abdominal pain, melena, hematochezia, severe indigestion/heartburn, hematuria, incontinence, genital sores, muscle weakness, suspicious skin lesions, transient blindness, difficulty walking, depression, unusual weight change, abnormal bleeding, enlarged lymph nodes, angioedema, and breast masses.    Physical Exam  General:   underweight appearing.  normal blood pressure Head:  Normocephalic and atraumatic without obvious abnormalities. No apparent alopecia or balding. Eyes:  No corneal or conjunctival inflammation noted. EOMI. Perrla. Funduscopic exam benign, without hemorrhages, exudates or papilledema. Vision grossly normal. Ears:  External ear exam shows no significant lesions or deformities.  Otoscopic examination reveals clear canals, tympanic membranes are intact bilaterally without bulging, retraction, inflammation or discharge. Hearing is grossly normal bilaterally. Mouth:  Oral mucosa and oropharynx without lesions or exudates.  Teeth in good repair. Neck:  No deformities, masses, or tenderness noted. Lungs:  a few faint scattered wheezing Heart:  Normal rate and regular rhythm. S1 and S2 normal without gallop, murmur, click, rub or other extra sounds. Abdomen:  Bowel sounds positive,abdomen soft and non-tender without masses, organomegaly or hernias noted.   Impression & Recommendations:  Problem # 1:  URI (ICD-465.9)  Problem # 2:  DIVERTICULOSIS (ICD-562.10)  Complete Medication List: 1)  Miralax Powd (Polyethylene glycol 3350) .Marland KitchenMarland Kitchen. 17 gm in water every other day 2)  Hyoscyamine Sulfate Cr 0.375 Mg Tb12 (Hyoscyamine sulfate) .... Two times a day 3)  Alprazolam 0.25 Mg Tabs (Alprazolam) .... 1/2-1 by mouth at bedtime as needed insomnia. do not use more than two weekly 4)  Doxepin Hcl 10 Mg Caps (Doxepin hcl) .... One at bedtime as needed for sleep 5)  Amitriptyline Hcl 25 Mg Tabs (Amitriptyline hcl) .Marland Kitchen.. 1 at bedtime  Patient Instructions: 1)  Get plenty of rest, drink lots of clear liquids, and use Tylenol or Ibuprofen for fever and comfort. Return in 7-10 days if you're not better:sooner if you're  feeling worse. 2)  Mucinex DM one twice daily Prescriptions: AMITRIPTYLINE HCL 25 MG TABS (AMITRIPTYLINE HCL) 1 at bedtime  #90 x 3   Entered and Authorized by:   Gordy Savers  MD   Signed by:    Gordy Savers  MD on 08/01/2009   Method used:   Print then Give to Patient   RxID:   [id number] ALPRAZOLAM 0.25 MG TABS (ALPRAZOLAM) 1/2-1 by mouth at bedtime as needed insomnia. DO NOT USE MORE THAN TWO WEEKLY  #30 x 3   Entered and Authorized by:   Gordy Savers  MD   Signed by:   Gordy Savers  MD on 08/01/2009   Method used:   Print then Give to Patient   RxID:   [id number] HYOSCYAMINE SULFATE CR 0.375 MG  TB12 (HYOSCYAMINE SULFATE) two times a day  #60 Tablet x 3   Entered and Authorized by:   Gordy Savers  MD   Signed by:   Gordy Savers  MD on 08/01/2009   Method used:   Print then Give to Patient   RxID:   [id number] MIRALAX  POWD (POLYETHYLENE GLYCOL 3350) 17 GM in water every other day  #527 gm x 3   Entered and Authorized by:   Gordy Savers  MD   Signed by:   Gordy Savers  MD on 08/01/2009   Method used:   Print then Give to Patient   RxID:   [id number]

## 2010-08-20 NOTE — Assessment & Plan Note (Signed)
Summary: b12 injection  Nurse Visit   Allergies: 1)  ! Penicillin V Potassium (Penicillin V Potassium) 2)  ! * Dilaudid 3)  ! Sulfa  Medication Administration  Injection # 1:    Medication: Vit B12 1000 mcg    Diagnosis: VITAMIN B12 DEFICIENCY (ICD-266.2)    Route: IM    Site: L deltoid    Exp Date: 10/2011    Lot #: [id number]    Mfr: [employer]    Comments: pt to schedule the next 3 month b12 at fromt desk    Patient tolerated injection without complications    Given by: Chales Abrahams CMA Duncan Dull) (March 15, 2010 3:46 PM)  Orders Added: 1)  Vit B12 1000 mcg [J3420]

## 2010-08-20 NOTE — Letter (Signed)
Summary: [facility]  [facility]   Imported By: Lester [place] 10/16/2009 08:22:31  _____________________________________________________________________  External Attachment:    Type:   Image     Comment:   External Document

## 2010-08-20 NOTE — Op Note (Signed)
Summary: Feraheme/[place]  Feraheme/[facility]   Imported By: Sherian Rein 06/19/2010 09:44:22  _____________________________________________________________________  External Attachment:    Type:   Image     Comment:   External Document

## 2010-08-20 NOTE — Assessment & Plan Note (Signed)
Summary: REV follow up/? iron def/sheri    History of Present Illness Visit Type: Follow-up Visit Primary GI MD: Stan Head MD [facility] Primary Provider: Gordy Savers  MD Requesting Provider: n/a Chief Complaint: Patient here to follow up on her anemia. States that she is having no GI complaints. History of Present Illness:   She is light-headed and weak and ?'s if anemic again Not seen since 12/2008 No bleeding reported. Not on iron. She had an iron infusoion last year and subsequently had July 2010 admissions for a fall and trauma with EtOH level 249. She denies alcohol use other than some Nyquil and ? 1 glass of wine at that time and denise alcohol use at this time. Also had a headache admission in July, headaches controlled on amitriptylline and is not using salicylates/NSAIDS.   GI Review of Systems      Denies abdominal pain, acid reflux, belching, bloating, chest pain, dysphagia with liquids, dysphagia with solids, heartburn, loss of appetite, nausea, vomiting, vomiting blood, weight loss, and  weight gain.        Denies anal fissure, black tarry stools, change in bowel habit, constipation, diarrhea, diverticulosis, fecal incontinence, heme positive stool, hemorrhoids, irritable bowel syndrome, jaundice, light color stool, liver problems, rectal bleeding, and  rectal pain.    Current Medications (verified): 1)  Miralax  Powd (Polyethylene Glycol 3350) .Marland KitchenMarland KitchenMarland Kitchen 17 Gm in Water Every Third Day 2)  Hyoscyamine Sulfate Cr 0.375 Mg  Tb12 (Hyoscyamine Sulfate) .... Two Times A Day As Needed 3)  Alprazolam 0.25 Mg Tabs (Alprazolam) .... 1/2 By Mouth At Bedtime As Needed Insomnia. Do Not Use More Than Two Weekly 4)  Amitriptyline Hcl 25 Mg Tabs (Amitriptyline Hcl) .Marland Kitchen.. 1 At Bedtime  Allergies (verified): 1)  ! Penicillin V Potassium (Penicillin V Potassium) 2)  ! * Dilaudid 3)  ! Sulfa  Past History:  Past Medical History: Last updated: 12/19/2008 Anxiety Osteoporosis PUD  2006 requiring resection Hypertension Diverticulosis, severe in sigmoid Headaches Prior EtOH abuse  Past Surgical History: Last updated: 07/09/2007 Tubal ligation, bilateral 1977 gastric ulcer, EGD  2006, perforated, s/p gastrojejunostomy and oversew  Family History: Reviewed history from 01/08/2009 and no changes required. No FH of Colon Cancer: No family history of any cancers   Social History: Reviewed history from 12/19/2008 and no changes required. Married Alcohol use-no Regular exercise-yes Patient has never smoked.  Illicit Drug Use - no Occupation: unemployed  Review of Systems       per HPI  Vital Signs:  Patient profile:   60 year old female Height:      63 inches Weight:      103.4 pounds BMI:     18.38 Pulse rate:   96 / minute Pulse rhythm:   regular BP sitting:   122 / 66  (left arm) Cuff size:   regular  Vitals Entered By: Harlow Mares CMA Duncan Dull) (September 26, 2009 4:26 PM)  Physical Exam  General:  underweight appearing.  normal blood pressure Lungs:  Clear throughout to auscultation. Heart:  Regular rate and rhythm; no murmurs, rubs,  or bruits. Abdomen:  Bowel sounds positive,abdomen soft and non-tender without masses, organomegaly or hernias noted. Skin:  pale Psych:  Alert and cooperative. Normal mood and affect.   Impression & Recommendations:  Problem # 1:  ANEMIA, IRON DEFICIENCY, RECURRENT (ICD-280.9) Assessment Deteriorated this has been a recurrent problem, status post hemigastrectomy. She has been worked up, having had EGD, push enteroscopy and a colonoscopy since 2008.  The LAD the first 2 studies were performed in 2010. She's a well symptomatic with lightheadedness and she has not had her CBC followup since those hospitalizations in July so we need to do that she may need further iron therapy. Orders: TLB-CBC Platelet - w/Differential ([id number])  Problem # 2:  INTESTINAL MALABSORPTION, POSTSURGICAL (ICD-579.3) Assessment:  Unchanged  Orders: TLB-B12, Serum-Total ONLY ([id number])  Patient Instructions: 1)  Please go to the basement to have your lab tests drawn today.  2)  We will call you with results and further follow up. 3)  The medication list was reviewed and reconciled.  All changed / newly prescribed medications were explained.  A complete medication list was provided to the patient / caregiver.

## 2010-08-20 NOTE — Assessment & Plan Note (Signed)
Summary: 69-MONTH B12 SHOT...LSW.  Nurse Visit   Allergies: 1)  ! Penicillin V Potassium (Penicillin V Potassium) 2)  ! * Dilaudid 3)  ! Sulfa  Medication Administration  Injection # 1:    Medication: Vit B12 1000 mcg    Diagnosis: ANEMIA-IRON DEFICIENCY (ICD-280.9)    Route: IM    Site: L deltoid    Exp Date: 10/13    Lot #: [id number]    Mfr: [employer]    Comments: I have reminded patient that she is due to have labs completed per Dr Marvell Fuller recommendation around 07/16/10.    Patient tolerated injection without complications    Given by: Lamona Curl CMA Duncan Dull) (June 17, 2010 2:56 PM)  Orders Added: 1)  Vit B12 1000 mcg [J3420]

## 2010-08-22 NOTE — Assessment & Plan Note (Signed)
Summary: HTN CONCERNS // RS   Vital Signs:  Patient profile:   60 year old female Weight:      98 pounds Temp:     98.0 degrees F oral BP sitting:   160 / 80  (left arm) Cuff size:   regular CC: blood pressure concerns  Is Patient Diabetic? No   Primary Care Provider:  Gordy Savers  MD  CC:  blood pressure concerns .  History of Present Illness: 60 year old patient who is followed closely by GI.  Due to deficiency anemia.  She was seen recently for an iron infusion and noted to have a elevated systolic blood pressure reading.  She does have a prior history of hypertension, which has been treated a few years ago.  She has been tracking home blood pressure readings were consistently elevated systolic readings.  She has a history of Raynaud's phenomenon. She also complains of insomnia.  This has been a chronic problem.  She states that she has increased her alprazolam to 0.5 mg at bedtime.  This has not been very effective.  She has been on Rozerem  in the past without much benefit. She has a history of EtOH chronic headaches chronic anxiety disorder.  Allergies: 1)  ! Penicillin V Potassium (Penicillin V Potassium) 2)  ! * Dilaudid 3)  ! Sulfa  Past History:  Past Medical History: Anxiety Osteoporosis PUD 2006 requiring resection Hypertension Diverticulosis, severe in sigmoid Headaches Prior EtOH abuse Anemia-iron deficiency chronic insomnia  Past Surgical History: Reviewed history from 07/09/2007 and no changes required. Tubal ligation, bilateral 1977 gastric ulcer, EGD  2006, perforated, s/p gastrojejunostomy and oversew  Review of Systems       The patient complains of headaches.  The patient denies anorexia, fever, weight loss, weight gain, vision loss, decreased hearing, hoarseness, chest pain, syncope, dyspnea on exertion, peripheral edema, prolonged cough, hemoptysis, abdominal pain, melena, hematochezia, severe indigestion/heartburn, hematuria,  incontinence, genital sores, muscle weakness, suspicious skin lesions, transient blindness, difficulty walking, depression, unusual weight change, abnormal bleeding, enlarged lymph nodes, angioedema, and breast masses.    Physical Exam  General:  underweight appearing.  160/80underweight appearing.   Head:  Normocephalic and atraumatic without obvious abnormalities. No apparent alopecia or balding. Eyes:  No corneal or conjunctival inflammation noted. EOMI. Perrla. Funduscopic exam benign, without hemorrhages, exudates or papilledema. Vision grossly normal. Mouth:  Oral mucosa and oropharynx without lesions or exudates.  Teeth in good repair. Neck:  No deformities, masses, or tenderness noted. Lungs:  Normal respiratory effort, chest expands symmetrically. Lungs are clear to auscultation, no crackles or wheezes. Heart:  Normal rate and regular rhythm. S1 and S2 normal without gallop, murmur, click, rub or other extra sounds.   Impression & Recommendations:  Problem # 1:  ANEMIA-IRON DEFICIENCY (ICD-280.9)  Problem # 2:  HYPERTENSION (ICD-401.9)  Her updated medication list for this problem includes:    Amlodipine Besylate 2.5 Mg Tabs (Amlodipine besylate) ..... One daily  Her updated medication list for this problem includes:    Amlodipine Besylate 2.5 Mg Tabs (Amlodipine besylate) ..... One daily  Problem # 3:  INSOMNIA (ICD-780.52)  Complete Medication List: 1)  Miralax Powd (Polyethylene glycol 3350) .Marland KitchenMarland Kitchen. 17 gm in water every third day 2)  Hyoscyamine Sulfate Cr 0.375 Mg Tb12 (Hyoscyamine sulfate) .... Two times a day as needed 3)  Alprazolam 0.5 Mg Tabs (Alprazolam) .... One at bedtime 4)  Trazodone Hcl 50 Mg Tabs (Trazodone hcl) .... One at bedtime 5)  Amlodipine Besylate  2.5 Mg Tabs (Amlodipine besylate) .... One daily  Patient Instructions: 1)  Please schedule a follow-up appointment in 1 month. 2)  Limit your Sodium (Salt). 3)  It is important that you exercise regularly  at least 20 minutes 5 times a week. If you develop chest pain, have severe difficulty breathing, or feel very tired , stop exercising immediately and seek medical attention. Prescriptions: AMLODIPINE BESYLATE 2.5 MG TABS (AMLODIPINE BESYLATE) one daily  #90 x 3   Entered and Authorized by:   Gordy Savers  MD   Signed by:   Gordy Savers  MD on 07/02/2010   Method used:   Print then Give to Patient   RxID:   [id number] TRAZODONE HCL 50 MG TABS (TRAZODONE HCL) one at bedtime  #90 x 3   Entered and Authorized by:   Gordy Savers  MD   Signed by:   Gordy Savers  MD on 07/02/2010   Method used:   Print then Give to Patient   RxID:   [id number] ALPRAZOLAM 0.5 MG TABS (ALPRAZOLAM) one at bedtime  #90 x 3   Entered and Authorized by:   Gordy Savers  MD   Signed by:   Gordy Savers  MD on 07/02/2010   Method used:   Print then Give to Patient   RxID:   [id number]    Orders Added: 1)  Est. Patient Level IV [01027]

## 2010-08-22 NOTE — Assessment & Plan Note (Signed)
Summary: 1 month fup//ccm   Vital Signs:  Patient profile:   60 year old female Weight:      99 pounds Temp:     97.6 degrees F oral BP sitting:   130 / 70  (left arm) Cuff size:   regular  Vitals Entered By: Duard Brady LPN (July 25, 2010 7:57 AM) CC: 1 mos rov - doing well Is Patient Diabetic? No   Primary Care Provider:  Gordy Savers  MD  CC:  1 mos rov - doing well.  History of Present Illness: 76 -year-old female seen today  for follow-up of her hypertension.  She was placed on amlodipine 2.5 mg last month due to consistently high, primarily systolic readings.  She has tolerated this medication well.  There have been no  headaches, peripheral edema.  She has been monitoring blood pressure readings with nice results at the drug store.  She has a history of anemia and recent laboratory studies reveals this has normal.  She has been followed closely by GI.  She has received B12 injections, as well as iron infusions  Allergies: 1)  ! Penicillin V Potassium (Penicillin V Potassium) 2)  ! * Dilaudid 3)  ! Sulfa  Past History:  Past Medical History: Reviewed history from 07/02/2010 and no changes required. Anxiety Osteoporosis PUD 2006 requiring resection Hypertension Diverticulosis, severe in sigmoid Headaches Prior EtOH abuse Anemia-iron deficiency chronic insomnia  Physical Exam  General:  underweight appearing.  130/70, underweight appearing.   Head:  Normocephalic and atraumatic without obvious abnormalities. No apparent alopecia or balding. Mouth:  Oral mucosa and oropharynx without lesions or exudates.  Teeth in good repair. Neck:  No deformities, masses, or tenderness noted. Lungs:  Normal respiratory effort, chest expands symmetrically. Lungs are clear to auscultation, no crackles or wheezes. Heart:  Normal rate and regular rhythm. S1 and S2 normal without gallop, murmur, click, rub or other extra sounds. Abdomen:  Bowel sounds  positive,abdomen soft and non-tender without masses, organomegaly or hernias noted.   Impression & Recommendations:  Problem # 1:  ANEMIA-IRON DEFICIENCY (ICD-280.9)  Her updated medication list for this problem includes:    [facility] Iron 325 (65 Fe) Mg Tabs (Ferrous sulfate) .Marland Kitchen... 1 by mouth once daily  Her updated medication list for this problem includes:    Cvs Iron 325 (65 Fe) Mg Tabs (Ferrous sulfate) .Marland Kitchen... 1 by mouth once daily  Problem # 2:  HYPERTENSION (ICD-401.9)  Her updated medication list for this problem includes:    Amlodipine Besylate 2.5 Mg Tabs (Amlodipine besylate) ..... One daily  Her updated medication list for this problem includes:    Amlodipine Besylate 2.5 Mg Tabs (Amlodipine besylate) ..... One daily  Complete Medication List: 1)  Miralax Powd (Polyethylene glycol 3350) .Marland KitchenMarland Kitchen. 17 gm in water every third day 2)  Hyoscyamine Sulfate Cr 0.375 Mg Tb12 (Hyoscyamine sulfate) .... Two times a day as needed 3)  Alprazolam 0.5 Mg Tabs (Alprazolam) .... One at bedtime 4)  Trazodone Hcl 50 Mg Tabs (Trazodone hcl) .... One at bedtime 5)  Amlodipine Besylate 2.5 Mg Tabs (Amlodipine besylate) .... One daily 6)  Cvs Iron 325 (65 Fe) Mg Tabs (Ferrous sulfate) .Marland Kitchen.. 1 by mouth once daily  Patient Instructions: 1)  Please schedule a follow-up appointment in 4 months. 2)  Limit your Sodium (Salt). 3)  It is important that you exercise regularly at least 20 minutes 5 times a week. If you develop chest pain, have severe difficulty breathing, or  feel very tired , stop exercising immediately and seek medical attention.   Orders Added: 1)  Est. Patient Level III [16109]

## 2010-08-28 ENCOUNTER — Encounter: Payer: Self-pay | Admitting: Internal Medicine

## 2010-08-28 NOTE — Assessment & Plan Note (Signed)
Summary: SHINGLES? //RS   Vital Signs:  Patient profile:   60 year old female Weight:      96 pounds Temp:     98.1 degrees F oral BP sitting:   110 / 70  (right arm) Cuff size:   regular  Vitals Entered By: Duard Brady LPN (August 19, 2010 9:08 AM) CC: c/o shingles in grion and perirectal area - seen in urgentcare this wkend Is Patient Diabetic? No   Primary Care Provider:  Gordy Savers  MD  CC:  c/o shingles in grion and perirectal area - seen in urgentcare this wkend.  History of Present Illness: 60 year old patient who is seen today in follow-up.  She was evaluated and treated at the urgent care two days ago after presenting with a one-day history of a rash in the left perineal area.  She continues to have significant pain.  She is completing acyclovir.  No prior history of shingles.  She has had treated hypertension.  Allergies: 1)  ! Penicillin V Potassium (Penicillin V Potassium) 2)  ! * Dilaudid 3)  ! Sulfa  Past History:  Past Medical History: Reviewed history from 07/02/2010 and no changes required. Anxiety Osteoporosis PUD 2006 requiring resection Hypertension Diverticulosis, severe in sigmoid Headaches Prior EtOH abuse Anemia-iron deficiency chronic insomnia  Past Surgical History: Reviewed history from 07/09/2007 and no changes required. Tubal ligation, bilateral 1977 gastric ulcer, EGD  2006, perforated, s/p gastrojejunostomy and oversew  Review of Systems       The patient complains of suspicious skin lesions.  The patient denies anorexia, fever, weight loss, weight gain, vision loss, decreased hearing, hoarseness, chest pain, syncope, dyspnea on exertion, peripheral edema, prolonged cough, headaches, hemoptysis, abdominal pain, melena, hematochezia, severe indigestion/heartburn, hematuria, incontinence, genital sores, muscle weakness, transient blindness, difficulty walking, depression, unusual weight change, abnormal bleeding, enlarged  lymph nodes, angioedema, and breast masses.    Physical Exam  General:  underweight appearing.  no distress; pressure 140/72 Skin:  vesicular rash in the left perineum  and inner buttock region   Impression & Recommendations:  Problem # 1:  SHINGLES (ICD-053.9)  Problem # 2:  HYPERTENSION (ICD-401.9)  Her updated medication list for this problem includes:    Amlodipine Besylate 2.5 Mg Tabs (Amlodipine besylate) ..... One daily  Complete Medication List: 1)  Miralax Powd (Polyethylene glycol 3350) .Marland KitchenMarland Kitchen. 17 gm in water every third day 2)  Hyoscyamine Sulfate Cr 0.375 Mg Tb12 (Hyoscyamine sulfate) .... Two times a day as needed 3)  Alprazolam 0.5 Mg Tabs (Alprazolam) .... One at bedtime 4)  Trazodone Hcl 50 Mg Tabs (Trazodone hcl) .... One at bedtime 5)  Amlodipine Besylate 2.5 Mg Tabs (Amlodipine besylate) .... One daily 6)  Cvs Iron 325 (65 Fe) Mg Tabs (Ferrous sulfate) .Marland Kitchen.. 1 by mouth once daily 7)  Zovirax 400 Mg Tabs (Acyclovir) .Marland Kitchen.. 1 by mouth 5 times daily for 7 days 8)  Gabapentin 100 Mg Caps (Gabapentin) .... One capsule 3 times daily 9)  Hydrocodone-acetaminophen 5-500 Mg Tabs (Hydrocodone-acetaminophen) .... One every 4 to 6 hours as needed for pain  Patient Instructions: 1)  Limit your Sodium (Salt) to less than 2 grams a day(slightly less than 1/2 a teaspoon) to prevent fluid retention, swelling, or worsening of symptoms. 2)  Check your Blood Pressure regularly. If it is above:  150/90 consistently, you should make an appointment. Prescriptions: HYDROCODONE-ACETAMINOPHEN 5-500 MG TABS (HYDROCODONE-ACETAMINOPHEN) one every 4 to 6 hours as needed for pain  #90 x 0  Entered and Authorized by:   Gordy Savers  MD   Signed by:   Gordy Savers  MD on 08/19/2010   Method used:   Print then Give to Patient   RxID:   [id number] GABAPENTIN 100 MG CAPS (GABAPENTIN) one capsule 3 times daily  #90 x 0   Entered and Authorized by:   Gordy Savers  MD    Signed by:   Gordy Savers  MD on 08/19/2010   Method used:   Print then Give to Patient   RxID:   [id number]    Orders Added: 1)  Est. Patient Level III [32440]

## 2010-08-29 ENCOUNTER — Encounter: Payer: Self-pay | Admitting: Internal Medicine

## 2010-08-29 ENCOUNTER — Ambulatory Visit (INDEPENDENT_AMBULATORY_CARE_PROVIDER_SITE_OTHER): Payer: BC Managed Care – PPO | Admitting: Internal Medicine

## 2010-08-29 VITALS — BP 160/90 | Temp 98.0°F | Ht 63.0 in | Wt 96.0 lb

## 2010-08-29 DIAGNOSIS — I10 Essential (primary) hypertension: Secondary | ICD-10-CM

## 2010-08-29 MED ORDER — LISINOPRIL-HYDROCHLOROTHIAZIDE 20-12.5 MG PO TABS: 1.0000 | ORAL_TABLET | Freq: Every day | ORAL | Status: DC

## 2010-08-29 NOTE — Patient Instructions (Signed)
Limit your sodium (Salt) intake    It is important that you exercise regularly, at least 20 minutes 3 to 4 times per week.  If you develop chest pain or shortness of breath seek  medical attention.  Please check your blood pressure on a regular basis.  If it is consistently greater than 150/90, please make an office appointment.  Return in 3 months for follow-up   

## 2010-08-29 NOTE — Progress Notes (Signed)
  Subjective:    Patient ID: [name], female    DOB: Sep 05, 1950, 60 y.o.   MRN: [id number]  HPI  60 -year-old patient who is seen today for follow-up of hypertension.  She has been on low-dose amlodipine 2.5 mg daily.  More recently she does have elevated systolic readings in the 160 to 180 range.  She has a history of recent shingles, but is largely pain-free at present.  She is quite concerned about her elevated blood pressure and wishes to switch medications.  Review of Systems  Constitutional: Negative.   HENT: Negative for hearing loss, congestion, sore throat, rhinorrhea, dental problem, sinus pressure and tinnitus.   Eyes: Negative for pain, discharge and visual disturbance.  Respiratory: Negative for cough and shortness of breath.   Cardiovascular: Negative for chest pain, palpitations and leg swelling.  Gastrointestinal: Negative for nausea, vomiting, abdominal pain, diarrhea, constipation, blood in stool and abdominal distention.  Genitourinary: Negative for dysuria, urgency, frequency, hematuria, flank pain, vaginal bleeding, vaginal discharge, difficulty urinating, vaginal pain and pelvic pain.  Musculoskeletal: Negative for joint swelling, arthralgias and gait problem.  Skin: Negative for rash.  Neurological: Negative for dizziness, syncope, speech difficulty, weakness, numbness and headaches.  Hematological: Negative for adenopathy. Does not bruise/bleed easily.  Psychiatric/Behavioral: Negative for behavioral problems, dysphoric mood and agitation. The patient is not nervous/anxious.        Objective:   Physical Exam  Constitutional: She appears well-developed. No distress.       Blood pressure 170/80  HENT:  Head: Normocephalic.  Neck: Neck supple. No thyromegaly present.  Cardiovascular: Normal rate, regular rhythm, normal heart sounds and intact distal pulses.   No murmur heard.         Assessment & Plan:  Hypertension-  Will discontinue amlodipine and  placed on combination lisinopril diuretic therapy.  Lifestyle issues discussed.  Will continue home blood pressure readings And reassess in 3 months.

## 2010-09-18 ENCOUNTER — Encounter (INDEPENDENT_AMBULATORY_CARE_PROVIDER_SITE_OTHER): Payer: BC Managed Care – PPO

## 2010-09-18 ENCOUNTER — Encounter: Payer: Self-pay | Admitting: Internal Medicine

## 2010-09-18 DIAGNOSIS — E538 Deficiency of other specified B group vitamins: Secondary | ICD-10-CM

## 2010-09-26 NOTE — Assessment & Plan Note (Signed)
Summary: 3 MONTH B12 SHOTS//JMS  Nurse Visit   Allergies: 1)  ! Penicillin V Potassium (Penicillin V Potassium) 2)  ! * Dilaudid 3)  ! Sulfa  Medication Administration  Injection # 1:    Medication: Vit B12 1000 mcg    Diagnosis: VITAMIN B12 DEFICIENCY (ICD-266.2)    Route: IM    Site: L deltoid    Exp Date: 05/2012    Lot #: [id number]    Mfr: [employer]    Patient tolerated injection without complications    Given by: Merri Ray CMA (AAMA) (September 18, 2010 1:38 PM)  Orders Added: 1)  Vit B12 1000 mcg [J3420]

## 2010-10-22 ENCOUNTER — Other Ambulatory Visit (INDEPENDENT_AMBULATORY_CARE_PROVIDER_SITE_OTHER): Payer: BC Managed Care – PPO

## 2010-10-22 DIAGNOSIS — D509 Iron deficiency anemia, unspecified: Secondary | ICD-10-CM

## 2010-10-22 LAB — CBC WITH DIFFERENTIAL/PLATELET
Basophils Relative: 0.9 % (ref 0.0–3.0)
Eosinophils Absolute: 0 10*3/uL (ref 0.0–0.7)
Eosinophils Relative: 0.4 % (ref 0.0–5.0)
Hemoglobin: 13.4 g/dL (ref 12.0–15.0)
Lymphocytes Relative: 18.4 % (ref 12.0–46.0)
MCHC: 35 g/dL (ref 30.0–36.0)
Neutro Abs: 3.5 10*3/uL (ref 1.4–7.7)
RBC: 3.79 Mil/uL — ABNORMAL LOW (ref 3.87–5.11)
WBC: 4.8 10*3/uL (ref 4.5–10.5)

## 2010-10-23 ENCOUNTER — Telehealth: Payer: Self-pay

## 2010-10-23 DIAGNOSIS — D509 Iron deficiency anemia, unspecified: Secondary | ICD-10-CM

## 2010-10-23 NOTE — Progress Notes (Signed)
Quick Note:  Let her know Hgb ok but iron level down again  1) is she taking oral iron and orange juice? 2) MCV up - is she using alcohol again? ______

## 2010-10-23 NOTE — Telephone Encounter (Signed)
Spoke with pt and she states that she has not been taking the iron regularly, she has skipped several doses. Encouraged pt to take it regularly with orange juice to increase the absorption. Pt states that she has not been using alcohol again. She states that she did take a dose of Nyquil the night before her labs were drawn.

## 2010-10-23 NOTE — Telephone Encounter (Signed)
Have her do CBC and ferritin in 6 weeks for 280.9 iron deficiency anemia Thanks

## 2010-10-23 NOTE — Telephone Encounter (Signed)
Message copied by Chrystie Nose on Wed Oct 23, 2010  3:50 PM ------      Message from: Stan Head      Created: Wed Oct 23, 2010  1:17 PM       Let her know Hgb ok but iron level down again            1) is she taking oral iron and orange juice?      2) MCV up - is she using alcohol again?

## 2010-10-24 LAB — URINALYSIS, ROUTINE W REFLEX MICROSCOPIC
Glucose, UA: NEGATIVE mg/dL
Hgb urine dipstick: NEGATIVE
Protein, ur: NEGATIVE mg/dL
Specific Gravity, Urine: 1.027 (ref 1.005–1.030)
pH: 6 (ref 5.0–8.0)

## 2010-10-24 LAB — URINE CULTURE: Colony Count: NO GROWTH

## 2010-10-24 NOTE — Telephone Encounter (Signed)
Addended by: Selinda Michaels R. on: 10/24/2010 11:44 AM   Modules accepted: Orders

## 2010-10-24 NOTE — Telephone Encounter (Signed)
Pt aware of Dr. Marvell Fuller recommendations.

## 2010-10-27 LAB — CBC
HCT: 28 % — ABNORMAL LOW (ref 36.0–46.0)
HCT: 36.9 % (ref 36.0–46.0)
HCT: 28 % — ABNORMAL LOW (ref 36.0–46.0)
HCT: 28.4 % — ABNORMAL LOW (ref 36.0–46.0)
Hemoglobin: 9.3 g/dL — ABNORMAL LOW (ref 12.0–15.0)
MCHC: 32.1 g/dL (ref 30.0–36.0)
MCHC: 33.1 g/dL (ref 30.0–36.0)
MCHC: 33.1 g/dL (ref 30.0–36.0)
MCHC: 33.5 g/dL (ref 30.0–36.0)
MCHC: 33.6 g/dL (ref 30.0–36.0)
MCV: 89.5 fL (ref 78.0–100.0)
MCV: 89.6 fL (ref 78.0–100.0)
MCV: 90.5 fL (ref 78.0–100.0)
MCV: 88.3 fL (ref 78.0–100.0)
MCV: 89.7 fL (ref 78.0–100.0)
Platelets: 317 10*3/uL (ref 150–400)
Platelets: 357 10*3/uL (ref 150–400)
Platelets: 332 10*3/uL (ref 150–400)
Platelets: 334 10*3/uL (ref 150–400)
RBC: 3.13 MIL/uL — ABNORMAL LOW (ref 3.87–5.11)
RBC: 3.44 MIL/uL — ABNORMAL LOW (ref 3.87–5.11)
RBC: 4.07 MIL/uL (ref 3.87–5.11)
RBC: 3.16 MIL/uL — ABNORMAL LOW (ref 3.87–5.11)
RBC: 3.16 MIL/uL — ABNORMAL LOW (ref 3.87–5.11)
RDW: 24.6 % — ABNORMAL HIGH (ref 11.5–15.5)
WBC: 11.5 10*3/uL — ABNORMAL HIGH (ref 4.0–10.5)
WBC: 11 10*3/uL — ABNORMAL HIGH (ref 4.0–10.5)
WBC: 7.3 10*3/uL (ref 4.0–10.5)

## 2010-10-27 LAB — POCT I-STAT, CHEM 8
BUN: 12 mg/dL (ref 6–23)
Chloride: 89 mEq/L — ABNORMAL LOW (ref 96–112)
Chloride: 88 mEq/L — ABNORMAL LOW (ref 96–112)
Creatinine, Ser: 0.7 mg/dL (ref 0.4–1.2)
Glucose, Bld: 141 mg/dL — ABNORMAL HIGH (ref 70–99)
Glucose, Bld: 114 mg/dL — ABNORMAL HIGH (ref 70–99)
HCT: 29 % — ABNORMAL LOW (ref 36.0–46.0)
Hemoglobin: 9.9 g/dL — ABNORMAL LOW (ref 12.0–15.0)
Potassium: 3.8 mEq/L (ref 3.5–5.1)
Potassium: 3.8 mEq/L (ref 3.5–5.1)

## 2010-10-27 LAB — BASIC METABOLIC PANEL
BUN: 6 mg/dL (ref 6–23)
BUN: 8 mg/dL (ref 6–23)
BUN: 3 mg/dL — ABNORMAL LOW (ref 6–23)
BUN: 4 mg/dL — ABNORMAL LOW (ref 6–23)
CO2: 25 mEq/L (ref 19–32)
CO2: 27 mEq/L (ref 19–32)
CO2: 30 mEq/L (ref 19–32)
CO2: 31 mEq/L (ref 19–32)
Calcium: 8.2 mg/dL — ABNORMAL LOW (ref 8.4–10.5)
Calcium: 8.7 mg/dL (ref 8.4–10.5)
Chloride: 93 mEq/L — ABNORMAL LOW (ref 96–112)
Chloride: 92 mEq/L — ABNORMAL LOW (ref 96–112)
Chloride: 93 mEq/L — ABNORMAL LOW (ref 96–112)
Chloride: 95 mEq/L — ABNORMAL LOW (ref 96–112)
Creatinine, Ser: 0.66 mg/dL (ref 0.4–1.2)
Creatinine, Ser: 0.72 mg/dL (ref 0.4–1.2)
Creatinine, Ser: 0.39 mg/dL — ABNORMAL LOW (ref 0.4–1.2)
Creatinine, Ser: 0.45 mg/dL (ref 0.4–1.2)
GFR calc Af Amer: >60 mL/min (ref >60)
GFR calc Af Amer: >60 mL/min (ref >60)
GFR calc Af Amer: >60 mL/min (ref >60)
GFR calc non Af Amer: >60 mL/min (ref >60)
Glucose, Bld: 85 mg/dL (ref 70–99)
Potassium: 5.9 mEq/L — ABNORMAL HIGH (ref 3.5–5.1)
Potassium: 3.1 mEq/L — ABNORMAL LOW (ref 3.5–5.1)
Potassium: 3.7 mEq/L (ref 3.5–5.1)

## 2010-10-27 LAB — URINE MICROSCOPIC-ADD ON

## 2010-10-27 LAB — COMPREHENSIVE METABOLIC PANEL
AST: 24 U/L (ref 0–37)
BUN: 3 mg/dL — ABNORMAL LOW (ref 6–23)
CO2: 29 mEq/L (ref 19–32)
Calcium: 8 mg/dL — ABNORMAL LOW (ref 8.4–10.5)
Chloride: 98 mEq/L (ref 96–112)
Creatinine, Ser: 0.5 mg/dL (ref 0.4–1.2)
GFR calc non Af Amer: >60 mL/min (ref >60)
Glucose, Bld: 105 mg/dL — ABNORMAL HIGH (ref 70–99)
Total Bilirubin: 0.2 mg/dL — ABNORMAL LOW (ref 0.3–1.2)

## 2010-10-27 LAB — TSH: TSH: 0.826 u[IU]/mL (ref 0.350–4.500)

## 2010-10-27 LAB — URINALYSIS, ROUTINE W REFLEX MICROSCOPIC
Nitrite: NEGATIVE
Specific Gravity, Urine: 1.009 (ref 1.005–1.030)
Urobilinogen, UA: 1 mg/dL (ref 0.0–1.0)

## 2010-10-27 LAB — CORTISOL: Cortisol, Plasma: 39 ug/dL

## 2010-10-27 LAB — URINE CULTURE: Culture: NO GROWTH

## 2010-10-27 LAB — ETHANOL: Alcohol, Ethyl (B): 297 mg/dL — ABNORMAL HIGH (ref 0–10)

## 2010-10-27 LAB — OSMOLALITY: Osmolality: 260 mOsm/kg — ABNORMAL LOW (ref 275–300)

## 2010-10-29 LAB — URINALYSIS, ROUTINE W REFLEX MICROSCOPIC
Bilirubin Urine: NEGATIVE
Glucose, UA: NEGATIVE mg/dL
Hgb urine dipstick: NEGATIVE
Ketones, ur: 15 mg/dL — AB
Protein, ur: NEGATIVE mg/dL
Specific Gravity, Urine: 1.016 (ref 1.005–1.030)
Urobilinogen, UA: 0.2 mg/dL (ref 0.0–1.0)
Urobilinogen, UA: 0.2 mg/dL (ref 0.0–1.0)

## 2010-10-29 LAB — COMPREHENSIVE METABOLIC PANEL
CO2: 26 mEq/L (ref 19–32)
Calcium: 8.7 mg/dL (ref 8.4–10.5)
Creatinine, Ser: 0.48 mg/dL (ref 0.4–1.2)
GFR calc non Af Amer: >60 mL/min (ref >60)
Glucose, Bld: 87 mg/dL (ref 70–99)

## 2010-10-29 LAB — DIFFERENTIAL
Lymphocytes Relative: 10 % — ABNORMAL LOW (ref 12–46)
Lymphs Abs: 1.3 10*3/uL (ref 0.7–4.0)
Neutrophils Relative %: 83 % — ABNORMAL HIGH (ref 43–77)

## 2010-10-29 LAB — CBC
Hemoglobin: 9.9 g/dL — ABNORMAL LOW (ref 12.0–15.0)
MCHC: 32.8 g/dL (ref 30.0–36.0)
MCV: 94.4 fL (ref 78.0–100.0)
RBC: 3.21 MIL/uL — ABNORMAL LOW (ref 3.87–5.11)

## 2010-10-29 LAB — LIPASE, BLOOD: Lipase: 12 U/L (ref 11–59)

## 2010-11-21 ENCOUNTER — Encounter: Payer: Self-pay | Admitting: Internal Medicine

## 2010-11-21 ENCOUNTER — Ambulatory Visit (INDEPENDENT_AMBULATORY_CARE_PROVIDER_SITE_OTHER): Payer: BC Managed Care – PPO | Admitting: Internal Medicine

## 2010-11-21 DIAGNOSIS — I10 Essential (primary) hypertension: Secondary | ICD-10-CM

## 2010-11-21 DIAGNOSIS — G4700 Insomnia, unspecified: Secondary | ICD-10-CM

## 2010-11-21 DIAGNOSIS — F411 Generalized anxiety disorder: Secondary | ICD-10-CM

## 2010-11-21 MED ORDER — ALPRAZOLAM 0.5 MG PO TABS: 0.5000 mg | ORAL_TABLET | Freq: Every evening | ORAL | Status: DC | PRN

## 2010-11-21 MED ORDER — LISINOPRIL-HYDROCHLOROTHIAZIDE 20-12.5 MG PO TABS: 1.0000 | ORAL_TABLET | Freq: Every day | ORAL | Status: DC

## 2010-11-21 NOTE — Progress Notes (Signed)
  Subjective:    Patient ID: [name], female    DOB: [birth year], 60 y.o.   MRN: [id number]  HPI  60 year old patient who is in today for followup of her hypertension. Her chief complaint today is insomnia. She has been on trazodone with little benefit. She does have a prescription for alprazolam that she takes very infrequently. She does have a history of anxiety and prior alcohol abuse. No other concerns or complaints.    Review of Systems  Constitutional: Negative.   HENT: Negative for hearing loss, congestion, sore throat, rhinorrhea, dental problem, sinus pressure and tinnitus.   Eyes: Negative for pain, discharge and visual disturbance.  Respiratory: Negative for cough and shortness of breath.   Cardiovascular: Negative for chest pain, palpitations and leg swelling.  Gastrointestinal: Negative for nausea, vomiting, abdominal pain, diarrhea, constipation, blood in stool and abdominal distention.  Genitourinary: Negative for dysuria, urgency, frequency, hematuria, flank pain, vaginal bleeding, vaginal discharge, difficulty urinating, vaginal pain and pelvic pain.  Musculoskeletal: Negative for joint swelling, arthralgias and gait problem.  Skin: Negative for rash.  Neurological: Negative for dizziness, syncope, speech difficulty, weakness, numbness and headaches.  Hematological: Negative for adenopathy.  Psychiatric/Behavioral: Positive for sleep disturbance. Negative for behavioral problems, dysphoric mood and agitation. The patient is not nervous/anxious.        Objective:   Physical Exam  Constitutional: She is oriented to person, place, and time. She appears well-developed and well-nourished.  HENT:  Head: Normocephalic.  Right Ear: External ear normal.  Left Ear: External ear normal.  Mouth/Throat: Oropharynx is clear and moist.  Eyes: Conjunctivae and EOM are normal. Pupils are equal, round, and reactive to light.  Neck: Normal range of motion. Neck supple. No thyromegaly  present.  Cardiovascular: Normal rate, regular rhythm, normal heart sounds and intact distal pulses.   Pulmonary/Chest: Effort normal and breath sounds normal.  Abdominal: Soft. Bowel sounds are normal. She exhibits no mass. There is no tenderness.  Musculoskeletal: Normal range of motion.  Lymphadenopathy:    She has no cervical adenopathy.  Neurological: She is alert and oriented to person, place, and time.  Skin: Skin is warm and dry. No rash noted.  Psychiatric: She has a normal mood and affect. Her behavior is normal.          Assessment & Plan:   Hypertension. Well controlled Insomnia. Sleep hygiene issues addressed. She does take alprazolam infrequently we'll try this as a sleep aid not to exceed twice per week  We'll recheck in 4 months

## 2010-11-21 NOTE — Patient Instructions (Signed)
Limit your sodium (Salt) intake    It is important that you exercise regularly, at least 20 minutes 3 to 4 times per week.  If you develop chest pain or shortness of breath seek  medical attention.  Return in 4 months for follow-up   

## 2010-12-03 NOTE — Assessment & Plan Note (Signed)
[facility]                         GASTROENTEROLOGY OFFICE NOTE   [name], [name]                         MRN:          [id number]  DATE:09/15/2007                            DOB:          1950-09-05    CHIEF COMPLAINT:  For follow up of bloating, iron deficiency anemia, CT  scanning.   CT angiogram demonstrated no evidence of mesenteric ischemia.  This is  reviewed with the patient.  She does have some mild aortic  calcification.  She continues to have intense postprandial bloating and  distention.  She was prescribed Robinul Forte generic and that helps,  but if she does not take it she continues to have cramps.  She has used  Darvocet-N 100 occasionally for severe pain.  She is moving her bowels  twice a day on MiraLax.  She has been on iron, but her ferritin is only  5 still.  She has not really responded.  She eats all the time saying  she does tend to comply with her post gastrectomy diet.   Her weight is up from 93 pounds in December to 99 pounds.  There are no  fevers reported.  She has not had vomiting.  She distends quite a bit,  and says she feels and looks pregnant almost right after she eats.  She  is not really complaining of excessive gas.   MEDICATIONS:  Her medications are listed and reviewed in the chart.   ALLERGIES:  She is allergic to SULFA and PENICILLIN.   PAST MEDICAL HISTORY:  Is reviewed and unchanged from her December 19  visit as well as her February 13 visit.  Note that the bloating is not  really helped by the medications.   PHYSICAL EXAMINATION:  GENERAL:  Reveals a thin somewhat chronically ill  white woman in no acute distress.  VITAL SIGNS:  Weight 99.4 pounds, pulse 68, blood pressure 138/72.  ABDOMEN:  Soft.  There is a midline scar.  No organomegaly or masses.  Bowel sounds are present.  There are no bruits.  HEENT:  Eyes are anicteric.  EXTREMITIES:  Lower extremity is free of edema.  She is thin, but  there  is no muscle wasting necessarily.  NEUROLOGICAL:  She is alert and oriented x3, appropriate affect.   ASSESSMENT:  1. Abdominal pain mainly infraumbilical but periumbilical in bilateral      lower quadrants.  2. Abdominal distention and bloating.  3. Persistent iron deficiency.  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  I      explained to her that there is probably a significant vagotomy      effect here.  This lady has had perforated pyloric ulcer with      gastrojejunostomy and oversew.  That was after she had a vagotomy      and pyloroplasty.  She had surgery in January 2006 for the duodenal      ulcer surgery, and then April 2006 she had perforation of her      oversewn pyloric ulcer and had the gastrojejunostomy by Dr. Ezzard Standing.   PLAN:  1. Continue current medications.  2. I explained to her that the bloating may be a very difficult      problem.  Consider the use of metoclopramide.  3. Before we make any other changes given the immediate postprandial      problem I do want to perform an upper endoscopy to look at her      gastric remnant and gastrojejunostomy to make sure there are no      mechanical problems there, though I suspect this is likely a      functional problem.  4. Her iron deficiency will be treated with an iron infusion at the      hospital.  She is unable to absorb iron well.  We may need to put      her back on some p.o. iron, but she is going to need periodic CBC's      to monitor her hemoglobin and iron levels as well and may need      repeated iron infusions.     Iva Boop, MD,FACG  Electronically Signed    CEG/MedQ  DD: 09/15/2007  DT: 09/15/2007  Job #: [id number]   cc:   Valetta Mole. Swords, MD

## 2010-12-03 NOTE — Discharge Summary (Signed)
[name], [name]                  ACCOUNT NO.:  [id number]   MEDICAL RECORD NO.:  [id number]          PATIENT TYPE:  INP   LOCATION:  4738                         FACILITY:  [facility]   PHYSICIAN:  Sean A. Everardo All, MD    DATE OF BIRTH:  1950-10-26   DATE OF ADMISSION:  06/16/2007  DATE OF DISCHARGE:  06/19/2007                               DISCHARGE SUMMARY   REASON FOR ADMISSION:  Abdominal pain.   HISTORY OF PRESENT ILLNESS:  60 year old woman admitted by Dr. Rito Ehrlich  on June 16, 2007.  Please refer to his dictated history and physical  for details.   HOSPITAL COURSE:  The patient was admitted and GI was consulted.  Ultrasound showed a slightly dilated common bile duct.  She also had  some dilated large bowel.  She underwent bowel prep  and colonoscopy.  It was normal except for a redundant colon.  She also had some  diverticulosis.  By June 19, 2007, she was eating her usual diet and  was ambulatory, and was thus discharged home in good condition.  Regarding her hypertension, her lisinopril and hydrochlorothiazide was  held, and her blood pressure was normal throughout her hospitalization.  It is presumed that she will need resumption of this medication in the  outpatient setting.   At the time of her discharge, the patient also requested a prescription  for Ambien.  I am concerned about her using amitriptyline, p.r.n. Xanax,  as well as  Ambien, so this request is declined with explanation to the  patient and her husband as to why this is.   IMPRESSION:  1. 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  2. Slightly elevated common bile duct.  3. Other chronic medical problems as noted during this admission.   MEDICATIONS:  1. MiraLax 17 g daily.  2. Otherwise same as Dr. Chancy Milroy history and physical.   FOLLOWUP:  Dr. Cato Mulligan 1-2 weeks, also Dr. [name] December 19th, as  scheduled.   ACTIVITY:  Increase slowly.   DIET:  No restriction.      Sean A.  Everardo All, MD  Electronically Signed     SAE/MEDQ  D:  06/19/2007  T:  06/19/2007  Job:  [id number]

## 2010-12-03 NOTE — H&P (Signed)
[name], [name]                  ACCOUNT NO.:  [id number]   MEDICAL RECORD NO.:  [id number]          PATIENT TYPE:  EMS   LOCATION:  MAJO                         FACILITY:  [facility]   PHYSICIAN:  Hollice Espy, M.D.DATE OF BIRTH:  01-Feb-1951   DATE OF ADMISSION:  06/16/2007  DATE OF DISCHARGE:                              HISTORY & PHYSICAL   CONSULTANTS:  Dora M. Juanda Chance, M.D., [facility].   PRIMARY CARE PHYSICIAN:  Valetta Mole. Swords, M.D.   CHIEF COMPLAINT:  Abdominal pain.   HISTORY OF PRESENT ILLNESS:  The patient is a 60 year old white female  with a past medical history of hypertension and migraines, as well as a  history of perforated pyloric ulcer in 2006 who underwent a duodenal  exclusion and gastrojejunostomy at that time, who presents to the  emergency room complaining of abdominal pain.  The patient, since the  procedure back in 2006, has been doing relatively well.  She tells me  that she is advised as per her surgeons, in regards to her GI, she has  taken Imodium once a day on a daily basis, as she has problems with  diarrhea, which she usually does.  However, she says that her bowel  movements have been regular.  She eats on a regular basis with  essentially no real problems.  However, her last bowel movement was  noted to be on Sunday, today being Wednesday.  She has noticed that for  the last several days she has had problem with increased abdominal  distention, mostly more so in the lower quadrants, but has generalized  crampy abdominal pain which began to hurt all over.  These symptoms  persisted, and she came into the emergency room for further evaluation.  In the emergency room, she initially had a slightly elevated blood  pressure of 148/80, and the rest of her vitals are normal.  She had labs  drawn.  She was found to have a normal white count of 9.9; however, with  an 82% shift.  Her liver function tests and BMET were normal.  Her  lipase was found to be  slightly elevated at 66, but was otherwise found  to be fine.  She underwent a CT scan of the abdomen and pelvis.  She had  marked colonic distention with gas and stool in the abdomen, and the  pelvis noted rectosigmoid dilatation with significant stool per distal  rectum and dilated cecum up to 17.1 cm in diameter.  She was noted to  have some small bowel loops that were decompressed.  Also separately  noted was intrahepatic and extrahepatic biliary dilatation of uncertain  etiology, new since her previous films done 2 years ago when she had her  perforated ulcer.  Because of the significant dilatation of the cecum  and ascending colon, the patient was found to be at increased risk for  perforation.  The patient, herself, is currently feeling slightly  nauseated and complains of abdominal fullness and some mild abdominal  pain.  She denies any headaches, vision changes.  No dysphagia, no chest  pain,  palpitations, no shortness of breath, no wheeze or cough, no  hematuria or dysuria, no diarrhea in the last few days.  No focal  extremity numbness, weakness or pain.   REVIEW OF SYSTEMS:  Otherwise negative.   PAST MEDICAL HISTORY:  1. Distant history of alcohol abuse.  2. History of perforated pyloric ulcer status post gastrojejunostomy      and oversew.  3. History of migraine headaches.  4. Hypertension.  5. Also, according to her, she has a history of a spot on her brain      which is found to be incidental and being followed, but is      currently felt to be benign at this point and stable.  6. History of hyponatremia in the past.  7. Depression and anxiety.  8. Poor nutritional intake.   MEDICATIONS:  1. Lisinopril/HCTZ 20/25; however, she takes half a pill of this      daily.  2. Elavil up to 75 q.h.s.  3. Imodium p.r.n.; she takes no more than 1 a day.  4. Xanax p.r.n.   ALLERGIES:  PENICILLIN.   SOCIAL HISTORY:  She denies any current alcohol abuse.  No tobacco or   drug use.   FAMILY HISTORY:  Negative for any type of cancer or other medical  conditions.   PHYSICAL EXAMINATION:  VITAL SIGNS:  The patient's vitals on admission  reveal a temperature of 97.3, heart rate 66, blood pressure 148/89 (down  to 103/62), respirations 22, satting 100% on room air.  GENERAL:  She is alert and oriented x3, in no apparent distress.  HEENT:  Normocephalic and atraumatic.  Mucous membranes are slightly  dry.  She has no carotid bruits.  HEART:  Regular rate and rhythm.  S1 and S2.  A 2/6 systolic ejection  murmur.  LUNGS:  Clear to auscultation bilaterally.  ABDOMEN:  Soft, distended, worse in the lower quadrants with some mild  tenderness.  Absent for bowel sounds.  EXTREMITIES:  No clubbing, cyanosis, or edema.  She looks slightly  emaciated.   LABORATORY DATA:  White count 9.9, hemoglobin and hematocrit of 11.7 and  34.6, MCV of 98, platelet count of 222, 80% neutrophils.  Sodium 132,  potassium 3.7, chloride 96, bicarbonate 29, BUN 13, creatinine 0.5,  glucose 152.  LFT's are completely unremarkable including a normal  albumin, normal transaminases, normal bilirubin.  IP slightly elevated  at 66.  I have ordered a CEA level which is pending, UA that is only  just 250 of glucose.  The patient is noted to be Hemoccult negative.  CT  scan is as per HPI.   ASSESSMENT AND PLAN:  1. Significant colonic distention with stool impaction.  I have spoken      with Dr. Juanda Chance, who is currently present in the emergency room and      will plan to see the patient for the possibility of a colonoscopy.      Certainly, the concern is that with the colonic distention and the      ductal dilatation mentioned below, this could possibly be a colon      cancer causing some narrowing, as well as some liver involvement,      although there is no liver mass or masses seen.  Again, will check      a CEA level, limit use of narcotics, and hopefully be able to      disimpact and  de-enlarge this woman's colon.  2. Ductal dilatation; a  new finding, and if it ends up being negative      for cancer, the possibility that the patient may      end up needing a further liver work-up as follow up.  3. Hypertension, currently stable.  Making NPO.  4. History of migraines, stable.      Hollice Espy, M.D.  Electronically Signed     SKK/MEDQ  D:  06/16/2007  T:  06/16/2007  Job:  [id number]   cc:   Hedwig Morton. Juanda Chance, MD  Valetta Mole Swords, MD

## 2010-12-03 NOTE — Consult Note (Signed)
NAMESAHARA, FUJIMOTO NO.:  [id number]   MEDICAL RECORD NO.:  [id number]          PATIENT TYPE:  INP   LOCATION:  3104                         FACILITY:  [facility]   PHYSICIAN:  Payton Doughty, M.D.      DATE OF BIRTH:  [birth year]   DATE OF CONSULTATION:  01/21/2009  DATE OF DISCHARGE:                                 CONSULTATION   BODY OF TEXT:  I was called to see this 60 year old right-handed white  female who was intoxicated, fell down steps, had a positive loss of  consciousness, transferred by EMS, was extremely intoxicated in the  emergency room.  CT showed a small right frontal subdural hematoma and  bibasilar skull fractures, no shift, and small amount of subarachnoid  blood.   PHYSICAL EXAMINATION:  On exam, she awakens and slightly follows  commands.  Pupils equal, round, and reactive to light.  Extraocular  movements are intact.  She speaks some.  Her face is equal.  She moves  all fours purposefully.   CT scan is as above.   CLINICAL IMPRESSION:  Closed head injury in extremely intoxicated  female.  We expect her exam to improve as her level of intoxication  diminishes.  She can observe for now and re-scan in about in 6 hours.            ______________________________  Payton Doughty, M.D.     MWR/MEDQ  D:  01/21/2009  T:  01/21/2009  Job:  [id number]

## 2010-12-03 NOTE — Discharge Summary (Signed)
[name], [name]                  ACCOUNT NO.:  [id number]   MEDICAL RECORD NO.:  [id number]          PATIENT TYPE:  INP   LOCATION:  3034                         FACILITY:  [facility]   PHYSICIAN:  Cherylynn Ridges, M.D.    DATE OF BIRTH:  1951/04/24   DATE OF ADMISSION:  01/21/2009  DATE OF DISCHARGE:  01/26/2009                               DISCHARGE SUMMARY   DISCHARGE DIAGNOSES:  1. Status post fell down a flight of steps.  2. Traumatic brain injury with subarachnoid hemorrhage, subdural      hematoma, and occipital skull fracture.  3. EtOH abuse, likely chronic.  4. History of peptic ulcer disease.  5. History of scalp contusion.  6. History of depleted nutritional stores.  7. Hyponatremia.   HISTORY ON ADMISSION:  This is a 60 year old female who was reportedly  intoxicated and fell down a flight of steps.  She presented with mild  confusion, but was hemodynamically stable.  She did have a scalp  hematoma, but no other overt signs of trauma.  She underwent a head CT  scan, which showed some subarachnoid, right subdural, and occipital  basilar skull fractures.   The patient was admitted to the neuro intensive care unit for  observation and mobilization.  She was seen in consultation by Dr. Channing Mutters,  who again agreed with observation in the intensive care unit.  She was  rescanned 6 hours post injury, and this actually showed improvement in  her subarachnoid hemorrhage and subdural.  The patient initially had a  great deal of nausea associated with her head injury, but this has since  resolved following discontinuation of narcotic pain medication.  The  patient did undergo C-spine CT scan, which was negative as well.  Flexion/extension views were negative, although limited, but the patient  had no tenderness and her C-collar was able to be discontinued as well.   At this time, she was mobilizing with physical therapy with a rolling  walker at the supervised level and she was being  discharged home with  the assistance of her husband.   Medically, she remained stable, although she did have persistent nausea  and vomiting initially.  She did experience some mild to moderate  hyponatremia with a sodium as low as 128, but this had improved by the  time of discharge.  I suspect that she may have some chronic  hyponatremia and this needs to be followed up as an outpatient and this  has been discussed with the patient and she will follow up with her  primary care physician Dr. Cato Mulligan.  She is otherwise continued on her  usual regimen at home.   The patient did undergo evaluation for EtOH abuse with an alcohol level  of 297 on arrival.  The social worker met with the patient and the  patient did have a alcohol and substance screening and brief  intervention referral to treatment evaluation.  The patient reports that  at this time she plans to stop alcohol use and does not feel that she  needs any additional services or support beyond  that of her family.   At this time, the patient is prepared for discharge home.   MEDICATIONS AT TIME OF DISCHARGE:  1. Ultram 50 mg 1-2 p.o. q.4 h. p.r.n. pain, #60 no refill.  2. She will continue her usual home medications of Elavil 25 mg 1-3      tablets at bedtime.  3. MiraLax daily.  4. Levsinex 0.375 mg p.o. q.12 h.   DIET AT DISCHARGE:  Regular.   FOLLOWUP APPOINTMENTS:  She does need to see Dr. Channing Mutters in 3-4 weeks.  She  could follow up with trauma service as needed, but can certainly call  for questions or concerns.  She will follow up with Dr. Birdie Sons in  2-3 weeks concerning her low sodium level, i.e., EtOH abuse, and general  medical issues.      Shawn Rayburn, P.A.      Cherylynn Ridges, M.D.  Electronically Signed    SR/MEDQ  D:  01/26/2009  T:  01/26/2009  Job:  [id number]   cc:   Payton Doughty, M.D.  Bruce Rexene Edison Swords, MD  [facility]

## 2010-12-03 NOTE — Assessment & Plan Note (Signed)
[facility]                         GASTROENTEROLOGY OFFICE NOTE   [name], [name]                         MRN:          [id number]  DATE:09/03/2007                            DOB:          08/28/1950    CHIEF COMPLAINT:  Bloating.   Her constipation is improved.  However, she is now having significant  postprandial bloating.  At the end of the day, she says she cannot fit  into her clothes, similar to where she was before, during her  hospitalization.  She is having some lower quadrant abdominal pain, as  well.  She is not describing fever or chills.  Her hands are turning  somewhat red and she wonders if it is the MiraLax, though there is no  other rash reported.  Her weight really is overall stable.  It is up a  few pounds from where it was.  She has always been small.  Two years  ago, she was 94 pounds.  She is 97 today.  She was 93 pounds when she  was here on December 19.  She is taking her iron, but again moving her  bowels every day, doing well.   PAST MEDICAL HISTORY:  Reviewed and unchanged from previous note,  December 19.   REVIEW OF SYSTEMS:  As above.   PHYSICAL:  Thin, no acute distress.  Weight 97 pounds, respirations 18, blood pressure 112/60.  EYES:  Anicteric.  ABDOMEN:  Soft, mildly tender in the left lower quadrant and groin area,  minimally so in the right lower quadrant and groin area.  There is no  organomegaly or mass.  There is a well-healed midline incision.  Bowel  sounds are present.  She is perhaps a little distended.  Slight  increased tympany.  Bowel sounds present.  I hear no bruits.  EXTREMITIES:  Show some erythema, which blanches on the fingers.  There  is no change to the digits.  They are slightly cool.  She denies any  severe pain on exposure to cold or any violaceous changes.   ASSESSMENT:  1. Postprandial bloating.  2. 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  3. Known diverticular disease with significant stenosis of the      diverticulosis.  4. Iron deficiency anemia.  Question cause.  Malabsorption after      gastric ulcer surgery is certainly possible.  Chronic blood loss is      possible, though we have not had an obvious reason for that.  5. Erythema of the hands.  I do not think this is anything significant      at this point.  I do not think she has a drug reaction.   MEDICATIONS:  Listed and reviewed in the chart.   PLAN:  1. CT angio to evaluate the mesenteric vessels.  She is not losing      weight, but she could certainly have mesenteric ischemia problems.  2. Pending that, consider empiric antibiotics, versus probiotic, for      bloating syndrome.  3. Consider surgical  evaluation for diverticular disease.  Question if      she could have occult diverticulitis.  4. Recheck CBC and ferritin today.     Iva Boop, MD,FACG  Electronically Signed    CEG/MedQ  DD: 09/03/2007  DT: 09/03/2007  Job #: [id number]   cc:   Valetta Mole. Swords, MD

## 2010-12-03 NOTE — Discharge Summary (Signed)
NAMEETHAL, GOTAY                  ACCOUNT NO.:  [id number]   MEDICAL RECORD NO.:  [id number]          PATIENT TYPE:  INP   LOCATION:  6710                         FACILITY:  [facility]   PHYSICIAN:  Rosalyn Gess. Norins, MD  DATE OF BIRTH:  [birth year]   DATE OF ADMISSION:  02/04/2009  DATE OF DISCHARGE:  02/08/2009                               DISCHARGE SUMMARY   ADMITTING DIAGNOSES:  1. Severe headache.  2. Hyponatremia.   DISCHARGE DIAGNOSES:  1. Pyelonephritis.  2. Headache resolved.  3. Hyponatremia secondary to high-volume intake, resolved.  4. History of alcohol abuse with no signs of withdrawal.  5. History of depression, stable.  6. Weight loss secondary to above problems.   CONSULTS:  None.   PROCEDURES:  1. CT of the head without contrast on the day of admission which      showed expected evolution left frontal parietal hematoma and      subdural hemorrhage compared with prior studies of 2 weeks ago.      There is residual subcortical edema on the left frontoparietal      white matter.  2. MRI of the brain, February 05, 2009, which showed small left frontal      parietal occipital subdural hematoma without midline shift.  Two      areas of hemorrhagic contusion of left frontal lobe compatible with      counter 2 mechanisms.  Right frontal parietal white matter lesion      is stable since 2008.  3. CT of the chest without contrast with no acute process in the      chest.  Tiny right apical lung nodule is noted.  If the patient is      at high risk, she should have a followup CT in 1 year.  4. CT of the abdomen with right greater than left heterogeneous renal      enhancement consistent with pyelonephritis.  Gallbladder distention      with nonspecific small amount of fluid in the vicinity of the      gallbladder, cholecystitis cannot be excluded.  Mild biliary ductal      dilatation of indeterminate etiology.  Probable constipation.  5. CT of the pelvis with no active pelvic  process.  6. Ultrasound of the abdomen is complete with marked extrahepatic      biliary ductal dilatation without sonographic evidence of      intrahepatic biliary ductal dilatation.  No evidence of retained      common duct stone.  No sonographic Murphy sign.   HISTORY OF PRESENT ILLNESS:  Ms. Louth is a 60 year old woman who  suffered traumatic brain injury after a fall on January 21, 2009.  She has a  subdural hematoma, subarachnoid hemorrhage, as well as occipital skull  fracture.  She did spend 5 days in neurointensive care and was  discharged home pain free.  The patient presented to the emergency  department on the day of admission complaining of nausea, vomiting, and  severe headache.  Please see the H and P for past medical history,  family history, and social history as well as physical exam at  admission.   HOSPITAL COURSE:  1. Severe headache.  The patient had followup CT and MRIs as noted.      No new injuries were noted.  The patient's pain was controlled with      morphine and then with Vicodin.  At this time, she is doing well      with resolution of her headache.  2. ID.  The patient with leukocytosis at admission with a urinary      tract infection.  The patient did have pyelonephritis by      radiographic criteria.  The patient was treated with Cipro.  Urine      culture was negative with no growth at 5 days.  The patient will      continue on Cipro as an outpatient for an additional 10 days for      completion of treatment.  3. Hyponatremia.  The patient at time of admission had a sodium of      123.  The patient was gently hydrated with normal saline with      correction of her hyponatremia to a discharge level of 133.  In      talking with the history, she had had copious free water intake      probably multiple quarts daily.  She is advised to limit her free      water intake to 2.5 L daily unless she has outside exposure with      sweating and heat.  The patient  was evaluated for possible      malignancy with negative CT chest, abdomen, and pelvis.  4. GI evaluation.  The patient with common bile duct dilatation.  No      evidence of cholecystitis or cholelithiasis.  The patient with no      significant discomfort at time of discharge.  She will need to have      followup attention to this problem.   With the patient's headache resolved with her UTI being diagnosed and  under treatment and now being afebrile and stable with the patient's  sodium having been corrected, she is thought to be ready for discharge  to home.   DISCHARGE EXAMINATION:  VITAL SIGNS:  Temperature was 97.4, blood  pressure 154/74, pulse was 85, respirations were 18, O2 sat is 98% on  room air.  GENERAL APPEARANCE:  A very thin Caucasian female, well tanned in no  acute distress.  HEENT:  Grossly unremarkable.  NECK:  Supple.  CHEST:  The patient is moving air well.  No rales, wheezes, or rhonchi.  CARDIOVASCULAR:  Quiet precordium is noted.  She had a regular rate and  rhythm.  ABDOMEN:  Scaphoid.  She had positive bowel sounds.  There is no  guarding or rebound or tenderness.  No further examination is conducted.   DISPOSITION:  The patient is to be discharged home.  We will resume Home  Health PT and OT and Social Work as needed.  The patient will resume all  of her home medications including tramadol 50 mg q.4 p.r.n., Hyoscyamine  ER 0.375 mg b.i.d., Rozerem 8 mg at bedtime, MiraLax every other day.  She will continue new medications Cipro 500 mg p.o. b.i.d. for an  additional 10 days.   The patient has been instructed to limit her free water intake.   The patient will need to follow up with Dr. Birdie Sons in 7-10 days,  at which time, she will need to have followup laboratory including a  basic metabolic panel.  The patient's condition at time of this  discharge dictation is stable and improved.       Rosalyn Gess Norins, MD  Electronically Signed      MEN/MEDQ  D:  02/08/2009  T:  02/09/2009  Job:  [id number]   cc:   Valetta Mole. Swords, MD

## 2010-12-03 NOTE — H&P (Signed)
NAMEDOREATHER, HOXWORTH                  ACCOUNT NO.:  [id number]   MEDICAL RECORD NO.:  [id number]          PATIENT TYPE:  INP   LOCATION:  6710                         FACILITY:  [facility]   PHYSICIAN:  Lonia Blood, M.D.       DATE OF BIRTH:  20-Mar-1951   DATE OF ADMISSION:  02/04/2009  DATE OF DISCHARGE:                              HISTORY & PHYSICAL   PRIMARY CARE PHYSICIAN:  Valetta Mole. Swords, MD   CHIEF COMPLAINT:  Severe headache.   HISTORY OF PRESENT ILLNESS:  Ms. Carranza is a 60 year old woman who has  suffered a traumatic brain injury on January 21, 2009 falling down a flight  of stairs.  She had subdural hematoma, subarachnoid hemorrhage, as well  as occipital skull fracture.  She was observed for 5 days in  neurointensive care unit and apparently was discharged home pain-free  and ready to continue her life.  Apparently, Ms. Rocco was brought in  today by her husband because for the past 4 days she has been nauseated  and vomiting and just feeling a tremendous amount of pain which does not  seem to be helped by the tramadol she was prescribed before she left the  hospital.  Ms. Formoso has a history of migraine headaches for which she  was taking amitriptyline prior to the fall.  She strongly denies any  alcohol usage since returning home.  The patient denies any illicit drug  use as well.   PAST MEDICAL HISTORY:  1. Perforated duodenal ulcer in 2006 status post gastrojejunostomy      followed by a complicated hospital course with feeding tube      placement and residual abdominal wall hernia.  2. Migraine headaches.  3. Depression.  4. 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   HOME MEDICATIONS:  Tramadol and Rozerem.   ALLERGIES:  DILAUDID and PENICILLIN.   SOCIAL HISTORY:  The patient is former heavy alcohol drinker.  She says  she has not used any in a long time.  No cigarettes.  No drugs.  She is  married.   FAMILY HISTORY:  Fairly  unremarkable.   REVIEW OF SYSTEMS:  As per HPI.  Other systems reviewed and negative.   PHYSICAL EXAMINATION UPON ADMISSION:  VITAL SIGNS:  Temperature is 98.4,  blood pressure 103/66, pulse 86, respiratory 18, blood pressure 103/66,  saturation 99% on room air.  GENERAL APPEARANCE:  Thin, frail woman that weighs only 37 pounds, very  uncomfortable on the stretcher due to supposedly having back pain from  getting the CAT scan done.  She also says that she is having a headache  but she mainly mentions the back pain.  She also telling me that she is  very nauseated by asking for a Coke.  HEENT:  Head seems to be normocephalic, atraumatic.  Eyes:  Pupil equal,  round and reactive to light and accommodation.  Extraocular muscles  intact.  Throat clear.  NECK:  Supple.  No JVD.  CHEST:  Clear to auscultation.  No wheezes,  rhonchi or crackles.  HEART:  Regular rate and rhythm without murmurs, rubs or gallops.  ABDOMEN:  Soft, nontender, nondistended.  No rebound or guarding.  EXTREMITIES:  Lower extremity without edema.  NEUROLOGIC:  Cranial nerves II-XII intact, strength 5/5 in all four  extremities, sensation intact.   LABORATORY VALUES:  Upon admission sodium is 133, potassium 3.8,  chloride 89, BUN 12, creatinine 0.7, glucose 141, hemoglobin 11.2.  Head  CT shows subdural hemorrhage, has nearly completely resolved.  The  chronic area of encephalomalacia in the right frontoparietal lobe is  stable and the ventricle size is stable.  No evidence of new brain  edema.   ASSESSMENT AND PLAN:  1. This is a 60 year old woman presenting after a traumatic brain      injury with what seems to be an intractable migraine headache.  Due      to the patient's recent trauma, I will also pursue an MRI of the      brain to assure she does not have any residual injury or new injury      that has not been apparent on the CT scan.  Ms. Lopezmartinez will be      admitted to the telemetry unit.  She will have  symptomatic      treatment for headaches, nausea, vomiting and anxiety.  2. Hyponatremia, suspect most likely due to syndrome of inappropriate      antidiuretic hormone (secretion) from the brain trauma.  I will      check serum osmolality, urine osmolality, cortisol level, restrict      the amount of water the patient is allowed to drink and try some      saline overnight.  3. Deep vein thrombosis prophylaxis will be done using sequential      compression devices.      Lonia Blood, M.D.  Electronically Signed     SL/MEDQ  D:  02/04/2009  T:  02/05/2009  Job:  [id number]   cc:   Valetta Mole. Swords, MD

## 2010-12-03 NOTE — Assessment & Plan Note (Signed)
[facility]                         GASTROENTEROLOGY OFFICE NOTE   CHARISSE, WENDELL                         MRN:          [id number]  DATE:10/11/2007                            DOB:          1951/04/19    CHIEF COMPLAINT:  Follow up of various problems.   Her iron infusion seems to have helped her fatigue.  She was not  tolerating p.o. iron well, and I was concerned about malabsorption after  her partial gastrectomy.  She still has bloating, but less on the  metoclopramide.  She is trying to eat frequent small meals.  She has  been moving her bowels well, in fact had multiple loose stools on  Sunday, a day ago.  I told her the Reglan could potentially do some of  that.  Weight is 99 pounds, which is stable to increased.  Current  medications are listed and reviewed in the chart.  She is on the  Amitriptyline, which provides excellent migraine prophylaxis.  She is  using MiraLax 1-2 times a day, generally twice a day, and she is on  metoclopramide 10 mg 3 times a day before meals.  She is not taking  glycopyrrolate or Darvocet at this time.   PAST MEDICAL HISTORY:  Reviewed and unchanged from previous notes.  See  October 13, 2007 for more recent.   PHYSICAL EXAMINATION:  VITAL SIGNS:  Weight 99 pounds.  Pulse 100.  Blood pressure 102/56.   ASSESSMENT:  1. Post gastrectomy syndrome, improved with metoclopramide.  2. Irritable bowel syndrome, improved.  3. Iron-deficiency anemia treated with iron supplementation.  Her      hemoglobin was normal, though her iron was not yet normal and was      not responding to p.o. iron that well.   PLAN:  1. She will continue the metoclopramide.  I did discuss the      possibility of tardive dyskinesia and neuropathy, Parkinson's, and      dystonia with her.  These are real, possible side effect.  The plan      is for her to try to use it only as needed.  It is the only legal      medication in the [place] that may help with this.      Domperidone is a possible option.  Will hold off on that per our      conversation today.  They know to report any of the changes,      symptoms or signs of things I described above to me.  Will plan to      see her in 6 months for reassessment.  2. Continue to eat frequent small meals.  3. Continue other medications as prescribed and continue primary care      follow up with Dr. Cato Mulligan.     Iva Boop, MD,FACG  Electronically Signed    CEG/MedQ  DD: 10/11/2007  DT: 10/11/2007  Job #: [id number]

## 2010-12-05 ENCOUNTER — Ambulatory Visit (INDEPENDENT_AMBULATORY_CARE_PROVIDER_SITE_OTHER): Payer: BC Managed Care – PPO | Admitting: Internal Medicine

## 2010-12-05 ENCOUNTER — Other Ambulatory Visit (INDEPENDENT_AMBULATORY_CARE_PROVIDER_SITE_OTHER): Payer: BC Managed Care – PPO

## 2010-12-05 DIAGNOSIS — E538 Deficiency of other specified B group vitamins: Secondary | ICD-10-CM

## 2010-12-05 DIAGNOSIS — D509 Iron deficiency anemia, unspecified: Secondary | ICD-10-CM

## 2010-12-05 LAB — CBC WITH DIFFERENTIAL/PLATELET
Eosinophils Relative: 0.7 % (ref 0.0–5.0)
HCT: 38.5 % (ref 36.0–46.0)
Hemoglobin: 13.5 g/dL (ref 12.0–15.0)
Lymphs Abs: 0.9 10*3/uL (ref 0.7–4.0)
Monocytes Relative: 5.7 % (ref 3.0–12.0)
Neutro Abs: 4.3 10*3/uL (ref 1.4–7.7)
Platelets: 339 10*3/uL (ref 150.0–400.0)
WBC: 5.6 10*3/uL (ref 4.5–10.5)

## 2010-12-05 LAB — FERRITIN: Ferritin: 10 ng/mL (ref 10.0–291.0)

## 2010-12-05 MED ORDER — CYANOCOBALAMIN 1000 MCG/ML IJ SOLN
1000.0000 ug | Freq: Once | INTRAMUSCULAR | Status: AC
  Administered 2010-12-05: 1000 ug via INTRAMUSCULAR

## 2010-12-06 NOTE — Consult Note (Signed)
[name], [name]                  ACCOUNT NO.:  [id number]   MEDICAL RECORD NO.:  [id number]          PATIENT TYPE:  INP   LOCATION:  2115                         FACILITY:  [facility]   PHYSICIAN:  Adolph Pollack, M.D.DATE OF BIRTH:  1950/12/04   DATE OF CONSULTATION:  07/31/2004  DATE OF DISCHARGE:                                   CONSULTATION   REFERRING PHYSICIAN:  Dr. Wilhemina Bonito. Marina Goodell.   REASON FOR CONSULTATION:  Upper GI bleeding.   HISTORY OF PRESENT ILLNESS:  Ms. Hendriks is a 60 year old female who began  having some bloody stool.  She also had near-syncopal episodes.  She was  found to have an upper GI bleed and to be orthostatic.  She subsequently was  admitted to the hospital.  She had some bright red hematemesis in the  hospital with some hypotension.  She subsequently was brought to the  endoscopy suite, where Dr. Marina Goodell was performing endoscopy and he paged me  urgently to come observe the endoscopy, just in case he could not stop the  bleeding and she would need an emergency operation.  She has  been known to  take Goody's powders.   PAST MEDICAL HISTORY:  1.  Osteoporosis.  2.  Mood disorder.  3.  Alcoholism.   PREVIOUS OPERATIONS:  Tubal ligation.   ALLERGIES:  PENICILLIN.   SOCIAL HISTORY:  Nonsmoker; she actually quit smoking back in 2000.  She is  married.   REVIEW OF SYSTEMS:  Her review of systems is unobtainable, given that she is  sedated and currently undergoing endoscopy.   PHYSICAL EXAMINATION:  GENERAL:  Undergoing endoscopy.  VITAL SIGNS:  Initially upon my arrival, she was hypotensive with a systolic  blood pressure in the 70s and a pulse in the 120s.  ABDOMEN:  Her abdomen was soft.  No obvious masses.  SKIN:  Skin is not jaundiced.   COMMENT:  During the endoscopy, Dr. Marina Goodell evacuated a clot and there was a  visible vessel at the base of a large duodenal ulcer.  He cauterized the  vessel with adequate control of hemostasis.   IMPRESSION:   Bleeding duodenal ulcer -- this has been adequately treated  endoscopically.  She has had the total of 4 units of blood at this time.  She is now more hemodynamically stable.   PLAN:  We will follow her closely with you and if she does re-bleed, she  probably will need operative management of this.      Todd   TJR/MEDQ  D:  08/01/2004  T:  08/01/2004  Job:  [id number]   cc:   Valetta Mole. Swords, M.D. [facility]   John N. Marina Goodell, M.D. [facility]

## 2010-12-06 NOTE — Op Note (Signed)
[name], [name]                  ACCOUNT NO.:  [id number]   MEDICAL RECORD NO.:  [id number]          PATIENT TYPE:  INP   LOCATION:  2302                         FACILITY:  [facility]   PHYSICIAN:  Sandria Bales. Ezzard Standing, M.D.  DATE OF BIRTH:  September 12, 1950   DATE OF PROCEDURE:  10/20/2004  DATE OF DISCHARGE:                                 OPERATIVE REPORT   PREOPERATIVE DIAGNOSIS:  Pneumoperitoneum with contrast in peritoneal  cavity.   POSTOPERATIVE DIAGNOSIS:  Perforation of large pyloric ulcer with  approximately 2 L of gastric contents in peritoneal cavity.  Ulcer disease  has destroyed approximately half of the wall of the proximal duodenum.   PROCEDURE:  Oversew of duodenal ulcer with an application of Tisseel,  approximately 5 mL, duodenal exclusion with TA-60 stapler, side-to-side  gastrojejunostomy (antecolic), feeding jejunostomy with 18-French red rubber  catheter, and esophagogastroduodenoscopy.   SURGEON:  Sandria Bales. Ezzard Standing, M.D.   First Assistant  Danna Hefty, M.D.   Anesthesia  General Endotracheal   INDICATION FOR PROCEDURE:  [name] is a 60 year old white female, who, I  think, is a patient of Dr. Willow Ora.  I think she is seen by [facility], was hospitalized in January for a bleeding duodenal ulcer.  She  underwent an open vagotomy and pyloroplasty by Dr. Megan Mans and the ulcer  was oversewn.  She was discharged home on Protonix, which she says she has  taken.  She was taking Goody's Headache powders, which she adamantly denies  that she has had any since last admission.  According to her husband she  continues to drink alcohol.  She has seen Dr. Marina Goodell 1 time as an outpatient  since her operation.   Approximately 11 p.m. last night, she developed sudden and severe abdominal  pain and presented to the emergency room this morning where a CT has  revealed a pneumoperitoneum with oral contrast in her peritoneal cavity.  She now comes for exploration to  repair a probable perforation of a duodenal  ulcer.   Operative Note:  The patient was given Cipro and Flagyl as a preop antibiotic, had a Foley  catheter in place, NG tube in place and underwent general endotracheal  anesthetic supervised by Dr. Diamantina Monks.   Her abdomen was prepped with Betadine solution and sterilely draped and had  sharp dissection carried down to the abdominal cavity.  The patient had  about 2 L of bile-stained, almost blackish-stained peritoneal fluid, in her  peritoneal cavity.  The abdomen was explored.  Right and left lobes of the  liver were unremarkable.  Gallbladder actually made a part of the wall of  this ulcer system.  The small bowel was run and was unremarkable.  There was  some omentum trapped in the lower pelvis.  I clamped the small bowel and  then went up and did an upper endoscopy at the initiation of the procedure  to try to see the extent of this ulcer disease.   At endoscopy, I could identify a hole in the wal of the duodenuml where a  finger was protruding and I saw a posterior ulcer which was probably about 2  cm in size.   I saw no gastric tumor, no esophageal tumor, no other mass or lesion noted.   I then removed the endoscopy.  I then rescrubbed in to operate.  I fractured  the gallbladder off the duodenum and found that the whole duodenal  pyloroplasty [name] was performed by Dr. [name] in January 2006] was disrupted  with an ulcer disease.   This left a large hole in the first protion of the duodenum which was  probably 4-cm-plus in length and, through this hole, I could see this  posterior ulcer that I visualized on the endoscope.   I then closed the ulcer in 2 layers with an serosal layer of 3-0 PDS suture  and then I inverted this suture line with horizontal mattress sutures using  2-0 silk.  Knowing how big the ulcer was, I have to imagine that there is a  good chance the repair will end up leaking.   I then excluded the  duodenum by taking a TA-60 stapler and firing this  immediately before the pylorus without dividing stomach or duodenum.  I then  mobilized the transverse colon, found the duodenum and I went up  approximately 45 cm distal to the ligament of Treitz, brought up a loop of  jejunum and did a 2-layer gastrojejunostomy.  The posterior layer with a 2-0  Vicryl suture; I then used the GI stapler to do the anastomosis and closed  the enterotomy with a running 2-0 Vicryl suture and then the anterior layer  was a running 2-0 Vicryl suture.   The anastomosis was at least 2 or 3 fingerbreadths by palpation.  I then  went beyond the gastrojejunostomy about another 30 or 40 cm and I placed a  jejunostomy tube.  The jejunostomy was created using a 18-French red rubber  catheter.  A hole was made into the jejunum and this was threaded down  probably 30 cm.  I cut some extra holes and the catheter drained well.  I  sewed it in place with a Vicryl suture.  I then Witzeled the tubing for  several centimeters with a 2-0 Vicryl suture; I brought it out through a  stab wound in the left side of her abdomen and tacked the jejunum up to the  anterior abdominal wall with interrupted 2-0 Vicryl sutures.   I then brought out 2 Blake drains, the 19-French drains, 1 posterior, which  is the lower drain, 1 Blake drain, which is anterior, which is the higher of  the drains, sewed these in place with 2-0 nylon sutures.  I then used  approximately 10 mL of Tisseel, first on the duodenal wound and then I  covered the anterior surface of the gastrojejunostomy with Tisseel.   I irrigated the abdomen out with approximately 8 L of saline.  She had this  mud-like greenish fluid, particularly down in her pelvis, which I thought I  had evacuated pretty well as I was irrigating pretty clear fluid.   The abdomen was then closed with 2 running #1 PDS sutures.  The skin was closed loosely with staples.  Each drain was sewn in  place with a 2-0 nylon  suture.  I flushed the jejunostomy tube and this seemed to flush well.   The sponge and needle count were correct at the end of the case.  She was  given two units of blood during the  case.  Dr. Katrinka Blazing plans to put a central  line at the end of the case and she will be transferred to the ICU for care.      DHN/MEDQ  D:  10/20/2004  T:  10/21/2004  Job:  [id number]   cc:   Rene Paci, M.D. [facility]  [address] [place], [place] [address]   Wanda Plump, MD [facility]  [phone] [place], [place] [address]   Wilhemina Bonito. Marina Goodell, M.D. [facility]

## 2010-12-06 NOTE — Discharge Summary (Signed)
[name], [name]                  ACCOUNT NO.:  [id number]   MEDICAL RECORD NO.:  [id number]          PATIENT TYPE:  INP   LOCATION:  5706                         FACILITY:  [facility]   PHYSICIAN:  Vikki Ports, MDDATE OF BIRTH:  1950/11/30   DATE OF ADMISSION:  07/30/2004  DATE OF DISCHARGE:  08/12/2004                                 DISCHARGE SUMMARY   DISCHARGE DIAGNOSES:  1.  Latent duodenal ulcer, status post repair.  2.  Anemia, status post transfusion.  3.  __________ disorder.  4.  Former alcohol abuse.   SURGICAL PROCEDURES:  1.  EGD, July 31, 2004.  2.  Exploratory laparotomy, pyloroplasty, vagotomy, repair of duodenal      ulcer.   HOSPITAL COURSE:  Ms. [name] is a 60 year old female, who had noted having  some bloody stool as well as near syncopal episodes.  She was admitted to  [facility] on July 30, 2004, and subsequently underwent EGD.  She was  found to have an acute duodenal ulcer with hemorrhage.  Ultimately, she did  require approximately 4 units of packed red cells due to profound anemia  with a hemoglobin as low as 7.7.  Ultimately, she did require surgical  intervention, as listed above secondary to her bleeding duodenal ulcer.  Gradually over time, she increased her activity and began progressing her  diet.  She had an uneventful postoperative course with the exception of some  postvagotomy diarrhea.  Her C. difficile was negative.  By August 12, 2004,  the patient was ready for discharge to home on the following medications:  1.  Protonix 40 mg daily.  2.  Vicodin 1-2 tabs q.6h. p.r.n. pain.  3.  She is not to lift over 10 pounds.   She is not to lift over 10 pounds.  She may return to __________ on August 19, 2004 (school).  Remain on a low fat diet.  Clean over area with soap and  water.  No scrubbing.  Call for questions or concerns.  She is to follow-up  with Dr. [name] in 2 weeks, [phone].      LB/MEDQ  D:  08/12/2004  T:   08/12/2004  Job:  [id number]   cc:   Cherylynn Ridges III, M.D.  [address]  [place]  [place] [address]

## 2010-12-06 NOTE — Discharge Summary (Signed)
NAMEBREDA, BOND                  ACCOUNT NO.:  [id number]   MEDICAL RECORD NO.:  [id number]          PATIENT TYPE:  INP   LOCATION:  5727                         FACILITY:  [facility]   PHYSICIAN:  Currie Paris, M.D.DATE OF BIRTH:  June 24, 1951   DATE OF ADMISSION:  10/20/2004  DATE OF DISCHARGE:  11/03/2004                                 DISCHARGE SUMMARY   DISCHARGE DIAGNOSES:  1.  Perforated pyloric ulcer, status post oversew of the ulcer, duodenal      exclusion, gastrojejunostomy, as well as feeding jejunostomy by Dr.      [name] on October 20, 2004.  2.  Anemia.  3.  History of alcohol abuse.  4.  Hyponatremia.  5.  Allergy to PENICILLIN.  6.  Depression, anxiety.  7.  Poor nutritional intake.   HOSPITAL COURSE:  Ms. Pelle is a 60 year old female who presented to the  emergency room with near syncope and abdominal pain.  A CT scan of the  abdomen showed free air.  Of note, she was in the hospital in January 2006  with a bleeding ulcer, and Dr. [name] performed a vagotomy and pyloroplasty.   The patient was then taken to the operating room by Dr. Ezzard Standing, and she was  found to have a perforation of a large pyloric ulcer at the old pyloroplasty  site, with approximately 2 liters of gastric contents into the peritoneal  cavity.  He then performed an oversew of the ulcer, duodenal exclusion,  gastrojejunostomy, and feeding jejunostomy, as well as an EGD  intraoperatively.  A Blake drain was placed anteriorly and posteriorly to  the duodenum.  The patient had a long postoperative course, including tube  feedings.  Gradually, she began eating, and her tubes were pulled.  She did  have a slow recovery as far as her ambulation, but this soon began to  improve.   During the patient's hospitalization, we did consult [facility].  Her primary physician is Dr. Birdie Sons.  This team assisted [place]  with nutritional needs, as well as electrolyte abnormalities.  By  postoperative day 14, we felt that she was ready to be discharged to home.  We discharged her to home in stable condition.  No driving for  two weeks, no lifting for four weeks.  She may utilize Vicodin 1-2 tablets  q.4 h. as needed for pain; Ambien 5 mg one p.o. at bedtime as needed.  She  is to follow up with Dr. [name] in two weeks.  She is to call for this  appointment.  We have instructed her not to drink alcohol, and no smoking.       LB/MEDQ  D:  12/30/2004  T:  12/30/2004  Job:  [id number]   cc:   Currie Paris, M.D.  [address]  [place]  [place] [address]   Valetta Mole. Swords, M.D. [facility]   Sandria Bales. Ezzard Standing, M.D.  [address]  [place]  [place] [address]

## 2010-12-06 NOTE — H&P (Signed)
NAMEDORRENE, BENTLY                  ACCOUNT NO.:  [id number]   MEDICAL RECORD NO.:  [id number]          PATIENT TYPE:  INP   LOCATION:                               FACILITY:  [facility]   PHYSICIAN:  Bruce Rexene Edison. Swords, M.D. [facility] OF BIRTH:  29-Jun-1951   DATE OF ADMISSION:  07/30/2004  DATE OF DISCHARGE:                                HISTORY & PHYSICAL   CHIEF COMPLAINT:  GI bleed.   Ms. Sanks is a 60 year old female who was in her usual state of health until  early this morning.  At 1 a.m. this morning, she woke up with some abdominal  cramping followed with diarrhea.  Shortly thereafter she had recurrent  crampy pain, had a bowel movement which was dark blood she thought.  She has  had 4-5 more episodes of blood but this is bright red blood.  She estimates  1/2 cup of blood with each bowel movement.  The crampy discomfort has  resolved.  She does complain of feeling very lightheaded when she stands up.  She has had 2 near syncope spells this morning.   PAST MEDICAL HISTORY:  1.  Alcoholism.  2.  Mood disorder.  3.  Osteoporosis.   MEDICATIONS:  She is not taking any prescription medications at this time.   ALLERGIES:  PENICILLIN.   SOCIAL HISTORY:  She is a nonsmoker, she is married, she is a homemaker (ex-  smoker since 2000).   FAMILY HISTORY:  Mother is healthy, father is deceased with pneumonia, has 2  healthy brothers.   REVIEW OF SYSTEMS:  She denies any chest pain, shortness of breath, PND,  orthopnea or any other complaints on a 15-system review of systems other  than that listed above.   PHYSICAL EXAMINATION:  VITAL SIGNS:  Temperature 100,1, weight 95, blood  pressure lying down 138/70, pulse 70.  While standing blood pressure 100/60,  pulse 105.  GENERAL:  She appears as thin, chronically ill female in no acute distress.  HEENT:  Atraumatic, normocephalic.  Extraocular muscles are intact.  Conjunctivae are pink, oropharynx is pink and moist.  NECK:  Supple  without lymphadenopathy, thyromegaly, jugular venous  distention, or carotid bruits.  CHEST:  Clear to auscultation without any increased work of breathing or  fremitus.  CARDIAC:  S1 & S2 are normal without murmurs or gallops.  ABDOMEN:  Active bowel sounds, soft, nontender.  There is no  hepatosplenomegaly and no masses are palpated.  EXTREMITIES:  There is no clubbing, cyanosis, or edema.  RECTAL:  Heme-positive stool.   ASSESSMENT AND PLAN:  1.  Gastrointestinal bleed.  Hemoglobin in the office is 13.6 but she is      quite orthostatic and I am concerned that she may lose a significant      amount more blood in the near future.  I think she needs aggressive      monitoring.  We will put her in a step-down unit.  We will check routine      laboratories and ask GI to see the patient.  2.  Alcoholism.  She has been abstinent for at least 2 years.  3.  Anxiety disorder.  Well controlled on no medications.  4.  Osteoporosis.  She has self-discontinued medications.  We will re-      address as an outpatient.      BHS/MEDQ  D:  07/30/2004  T:  07/30/2004  Job:  [id number]

## 2010-12-06 NOTE — Op Note (Signed)
NAMEBELMIRA, DALEY                  ACCOUNT NO.:  [id number]   MEDICAL RECORD NO.:  [id number]          PATIENT TYPE:  INP   LOCATION:  2115                         FACILITY:  [facility]   PHYSICIAN:  Cherylynn Ridges III, M.D.DATE OF BIRTH:  04/24/1951   DATE OF PROCEDURE:  DATE OF DISCHARGE:                                 OPERATIVE REPORT   DATE OF OPERATION:  August 03, 2004.   PREOPERATIVE DIAGNOSIS:  Bleeding ulcer from the second or third portion of  the duodenum.   POSTOPERATIVE DIAGNOSES:  1.  Bleeding ulcer from the second or third portion of the duodenum.  2.  Nonbleeding ulcer of the first portion of the duodenum.   PROCEDURE:  1.  Exploratory laparotomy.  2.  Duodenotomy.  3.  Oversew of bleeding duodenal ulcer.  4.  Pyloroplasty, Heineke-Mikulicz type.  5.  Anterior, posterior, and truncal vagotomies.   SURGEON:  Marta Lamas. [name], MD.   ASSISTANT:  Gita Kudo, MD.   ANESTHESIA:  General endotracheal.   ESTIMATED BLOOD LOSS:  Less than 100 mL.   COMPLICATIONS:  None.   CONDITION:  Stable.   SPECIMENS:  Anterior and posterior vagus nerves.   INDICATION FOR OPERATION:  The patient is a 60 year old woman, who had been  watched after bleeding from a duodenal ulcer that had been injected and  cauterized.  She started to bleed again today about 300 mL and became  hypotensive.  Her hemoglobin dropped from 13 to 10, and she was taken to the  operating room for exploration.   FINDINGS:  The patient had a shallow ulcer in the first portion of the  duodenum on the posterior wall.  This was not bleeding and was associated  with a blood vessel.  Deeper near the distal second to third portion of the  duodenum was a much deeper ulcer cavity, three areas of which had potential  for bleeding, although no active bleeding was noted at this time.  Though  two areas where there appeared to be exposed vessels that were partially  cauterized, which were oversewn with 2-0 silk  figure-of-eight stitches.  The  base of a deeper ulcer was also closed with a figure-of-eight stitch of 2-0  silk, and then a third area was oversewn with 3-0 silk.  We closed the area  subsequently.   OPERATION:  The patient was taken to the operating room and placed on the  table in the supine position, and after an adequate endotracheal anesthetic  was administered was prepped and draped in the usual sterile manner exposing  the midline.  A midline incision was made from the xiphoid down to the  umbilicus.  It was taken down through the midline using electrocautery.  We  exposed the abdomen and used a Balfour retractor to attain adequate  exposure.  The stomach could be seen to be distended with old blood as was  the first and second portion of the duodenum.  We placed two stay sutures on  the superior and inferior aspect of the pylorus and then made a longitudinal  incision across the pylorus, and a pyloromyotomy and a duodenotomy made  using electrocautery.  A large amount of clot was removed from the proximal  stomach, and also from the duodenum.  We could see the deep ulceration in  the second or third portion of the duodenum, which is the one that was  oversewn with 2-0 silk oversewing sutures.  Proximally in the first portion  of the duodenum was a 2 cm superficial ulcer, which had no active bleeding  or associated vessel.  In the deeper third portion duodenal ulcer, we  oversewed this area and there continued to be bile drainage into that area  indicating nonobstruction on the ampulla.  We irrigated with saline and then  we closed in two layers.  We did a Heineke-Mikulicz transverse closure using  a running 3-0 Vicryl in the mucosal layer and 2-0 silk Lembert stitches for  overflow of the serosal area.  This was done.  After this, it was closed in  a Heineke-Mikulicz manner.  We did a vagotomy, anterior and posterior.   With the Balfour retractor in place, we mobilized the  esophagus around a  Penrose drain, and we were able, with tension on the esophagus distally, to  isolate the posterior vagus nerve and also the anterior vagus nerve.  These  were resected between clips and sent as specimens.   Once the vagotomy was done, we irrigated with saline.  Once the saline  irrigation was completed, we closed the abdomen using a running #1 PDS  suture.  The skin was closed using stainless steel sutures.  No drains were  left in place.  All counts were correct.      Jame   JOW/MEDQ  D:  08/03/2004  T:  08/03/2004  Job:  [id number]

## 2010-12-06 NOTE — H&P (Signed)
NAMEALMIRA, PHETTEPLACE                  ACCOUNT NO.:  [id number]   MEDICAL RECORD NO.:  [id number]          PATIENT TYPE:  EMS   LOCATION:  MAJO                         FACILITY:  [facility]   PHYSICIAN:  Sandria Bales. Ezzard Standing, M.D.  DATE OF BIRTH:  1950-07-26   DATE OF ADMISSION:  10/20/2004  DATE OF DISCHARGE:                                HISTORY & PHYSICAL   Ms. Walls is a 60 year old white female, a patient of Dr. Willow Ora (?),  was  sitting in her reclining bed last night at about 11 p..m. when she had  sudden onset of abdominal pain.   She presented to the [facility] Emergency Room at approximately 5 a.m. this  morning,  and has been evaluated.  There is a question that she had a near  syncopal episode at home and became diaphoretic though she has been alert  and oriented since she has been here.  She had a CT scan which shows massive  free air with a large amount of contrast stained abdominal fluid in the  peritoneal cavity.   It is felt that she probably has a perforated viscus.  Of interest, she  underwent a laparotomy for a bleeding ulcer by Dr. Megan Mans on August 03, 2004.  This was diagnosed on EGD by Dr. Kizzie Furnish.  At that time he did a  pyloroplasty with a truncal vagotomy and oversewn of the bleeding ulcer.  She has been on Protonix since she has gone home.  She has taken 1 tablet  daily, however, she has continued to drink alcohol on a regular basis.  At her last hospitalization, she was taking a fair number of BC  tablets/powder, she said she has not taken that since her discharge in  January.   PAST MEDICAL HISTORY:  She is allergic to PENICILLIN which makes her swell.   MEDICATIONS:  Her only medication she complains of is Protonix.   REVIEW OF SYSTEMS:  NEUROLOGIC:  She has had no history of seizures or loss  of consciousness, though actually she had a syncopal episode here in the  emergency room with GI bleeding in the bathroom, but was alert and oriented  after that  episode.  CARDIAC:  No evidence of heart disease or chest pain.  PULMONARY:  She quit smoking 7-8 years ago.  No evidence of pneumonia or  tuberculosis.  GASTROINTESTINAL:  No history of liver disease, pancreatic disease, colon  disease.  She has 1 child who is married and lives here in town.  UROLOGIC:  No history of kidney infections.   Of note, from a medical standpoint she is a fairly thin, almost cachectic  looking woman.   PHYSICAL EXAMINATION:  VITAL SIGNS:  On physical exam her pulse is 110,  respirations 16, blood pressure 104/70.  HEENT:  Unremarkable before my exam.  She now has a little ding, abrasion on  her left forehead.  LUNGS:  Her lungs are symmetric to auscultation and clear.  HEART:  Her heart is tachycardic.  ABDOMEN:  Her abdomen shows a well-healed upper midline incision,  but she  actually has some bowel sounds and she is diffusely tender with some  guarding.  EXTREMITIES:  She has good strength in her upper and lower extremities.  NEUROLOGIC:  Grossly intact.   LABS:  Her labs show a sodium of 122, potassium 4.1, chloride 86, BUN 25,  creatinine 0.7.  Her hemoglobin is 10, hematocrit 30, white blood count of  1200, her PT is 12.4 with an INR of 0.9.   I have reviewed the CT scan with Arbie Cookey and the area of the leakage of  the contrast appears to be right at the stomach-duodenal junction.   IMPRESSION:  1.  Perforated viscus probable ulcer disease.  Her alcohol may be      contributing to her continued ulcer disease in light of Protonix.  Plan      to take her to the operating room for abdominal exploration. Either      oversewing of ulcer or possible gastric resection. I have discussed this      with the patient and her husband.  Potential risks include but are not      limited to bleeding, infection or leaking from the bowel, continued      ulceration, and even death.  I think that she is a lot sicker than she      is aware of.  2.  Alcohol use which  seems uncorrected.  3.  She is anemic and probably more anemic than she appears.  I think that      she is dehydrated.  4.  She is hyponatremic.  5.  Underweight, possible malnutrition.  6.  Leukopenia, probably overwhelming infection.      DHN/MEDQ  D:  10/20/2004  T:  10/20/2004  Job:  [id number]   cc:   Wanda Plump, MD [facility]  [phone] [place], [place] [address]   Wilhemina Bonito. Marina Goodell, M.D. [facility]   Rene Paci, M.D. [facility]  [address] [place], [place] [address]

## 2010-12-10 ENCOUNTER — Telehealth: Payer: Self-pay

## 2010-12-10 ENCOUNTER — Encounter: Payer: Self-pay | Admitting: Internal Medicine

## 2010-12-10 DIAGNOSIS — K912 Postsurgical malabsorption, not elsewhere classified: Secondary | ICD-10-CM | POA: Insufficient documentation

## 2010-12-10 DIAGNOSIS — D7589 Other specified diseases of blood and blood-forming organs: Secondary | ICD-10-CM | POA: Insufficient documentation

## 2010-12-10 NOTE — Telephone Encounter (Signed)
Patient advised.  She will come for lab work one day this week or next.

## 2010-12-10 NOTE — Telephone Encounter (Signed)
Message copied by Darcey Nora on Tue Dec 10, 2010  1:51 PM ------      Message from: Stan Head      Created: Tue Dec 10, 2010  5:52 AM       Hgb normal but iron is low      Please ask her what type and how much iron she is taking      She also needs a CMET use intestinal malabsorption dx and B12 and TSH use macrocytosis dx

## 2010-12-27 ENCOUNTER — Other Ambulatory Visit (INDEPENDENT_AMBULATORY_CARE_PROVIDER_SITE_OTHER): Payer: BC Managed Care – PPO

## 2010-12-27 DIAGNOSIS — D7589 Other specified diseases of blood and blood-forming organs: Secondary | ICD-10-CM

## 2010-12-27 DIAGNOSIS — K909 Intestinal malabsorption, unspecified: Secondary | ICD-10-CM

## 2010-12-27 LAB — COMPREHENSIVE METABOLIC PANEL
ALT: 19 U/L (ref 0–35)
Albumin: 4 g/dL (ref 3.5–5.2)
CO2: 31 mEq/L (ref 19–32)
Calcium: 9.6 mg/dL (ref 8.4–10.5)
Chloride: 89 mEq/L — ABNORMAL LOW (ref 96–112)
Creatinine, Ser: 0.6 mg/dL (ref 0.4–1.2)
GFR: 113.16 mL/min (ref >60.00)
Potassium: 4 mEq/L (ref 3.5–5.1)

## 2010-12-30 ENCOUNTER — Telehealth: Payer: Self-pay | Admitting: Internal Medicine

## 2010-12-30 NOTE — Telephone Encounter (Signed)
1) Labs show low sodium and chloride ? Dehydration, I hoe she is not using alcohol 2) B12 is barely normal and she needs a 1000 ug injection this week 3) see PCP about shingles and electrolyte disturbances (this week best)  4) can schedule a non-urgent follow-up with me (few weeks)

## 2010-12-30 NOTE — Telephone Encounter (Signed)
LMTCB

## 2010-12-30 NOTE — Telephone Encounter (Signed)
Spoke with patient and she states she had a Vit B12 injection on 12/05/10. She wants to be sure Dr. [name] knows this. She is in school now and it is difficult for her to get here with her school schedule. Instructed patient to call her PCP re: shingles and electrolyte disturbances. She did not want to schedule a f/u ov at this time, she wants to schedule it in August.

## 2010-12-30 NOTE — Telephone Encounter (Signed)
Patient wants to let us know she has shingles for the second time. Also, wants to know lab results from Friday.

## 2010-12-31 NOTE — Progress Notes (Signed)
Quick Note:  See phone note re: results communication ______

## 2010-12-31 NOTE — Telephone Encounter (Signed)
She needs another 1000 ug B12 injection this month and needs a BMET before the end of this week to follow-up hyponatremia

## 2011-01-01 NOTE — Telephone Encounter (Signed)
We have tried - she knows to call and arrange these.

## 2011-01-01 NOTE — Telephone Encounter (Signed)
I spoke with the patient again this am. She refused to schedule with me at this time again. "I am in school and taking exams".  I advised her how important it was for [place] to check her labs again and give her another B12.  She says she will call me back later and again refused to schedule for lab work or a B12

## 2011-01-06 ENCOUNTER — Encounter: Payer: Self-pay | Admitting: Internal Medicine

## 2011-01-06 ENCOUNTER — Other Ambulatory Visit: Payer: Self-pay

## 2011-01-06 ENCOUNTER — Ambulatory Visit (INDEPENDENT_AMBULATORY_CARE_PROVIDER_SITE_OTHER): Payer: BC Managed Care – PPO | Admitting: Internal Medicine

## 2011-01-06 DIAGNOSIS — I10 Essential (primary) hypertension: Secondary | ICD-10-CM

## 2011-01-06 DIAGNOSIS — E871 Hypo-osmolality and hyponatremia: Secondary | ICD-10-CM

## 2011-01-06 DIAGNOSIS — D7589 Other specified diseases of blood and blood-forming organs: Secondary | ICD-10-CM

## 2011-01-06 DIAGNOSIS — B029 Zoster without complications: Secondary | ICD-10-CM

## 2011-01-06 MED ORDER — TRAMADOL HCL 50 MG PO TABS: 50.0000 mg | ORAL_TABLET | Freq: Four times a day (QID) | ORAL | Status: AC | PRN

## 2011-01-06 MED ORDER — LISINOPRIL 20 MG PO TABS: 20.0000 mg | ORAL_TABLET | Freq: Every day | ORAL | Status: DC

## 2011-01-06 MED ORDER — NYSTATIN-TRIAMCINOLONE 100000-0.1 UNIT/GM-% EX OINT: TOPICAL_OINTMENT | Freq: Two times a day (BID) | CUTANEOUS | Status: DC

## 2011-01-06 NOTE — Patient Instructions (Signed)
Limit your sodium (Salt) intake  Return in 3 months for follow-up  Please check your blood pressure on a regular basis.  If it is consistently greater than 150/90, please make an office appointment.   

## 2011-01-06 NOTE — Progress Notes (Signed)
  Subjective:    Patient ID: [name], female    DOB: [birth year], 60 y.o.   MRN: [id number]  HPI  60 year old patient who is seen today for followup. She has a history of hypertension. This has been controlled on combination treatment that includes diuretic therapy. One week ago she was noted to have a serum sodium of 129. She is scheduled for followup labs tomorrow. She was seen at the urgent care over the weekend due to a recurrent rash in the left groin area. She was treated for shingles in January of this year with apparently a rash in the same location. She is on a second round of acyclovir. She also complains of back pain and is requesting analgesic and she also is requesting a note for work/school to cover her absence today.    Review of Systems  Constitutional: Negative.   HENT: Negative for hearing loss, congestion, sore throat, rhinorrhea, dental problem, sinus pressure and tinnitus.   Eyes: Negative for pain, discharge and visual disturbance.  Respiratory: Negative for cough and shortness of breath.   Cardiovascular: Negative for chest pain, palpitations and leg swelling.  Gastrointestinal: Negative for nausea, vomiting, abdominal pain, diarrhea, constipation, blood in stool and abdominal distention.  Genitourinary: Negative for dysuria, urgency, frequency, hematuria, flank pain, vaginal bleeding, vaginal discharge, difficulty urinating, vaginal pain and pelvic pain.  Musculoskeletal: Positive for back pain. Negative for joint swelling, arthralgias and gait problem.  Skin: Positive for rash.  Neurological: Negative for dizziness, syncope, speech difficulty, weakness, numbness and headaches.  Hematological: Negative for adenopathy.  Psychiatric/Behavioral: Negative for behavioral problems, dysphoric mood and agitation. The patient is not nervous/anxious.        Objective:   Physical Exam  Constitutional: She is oriented to person, place, and time. She appears well-developed and  well-nourished.  HENT:  Head: Normocephalic.  Right Ear: External ear normal.  Left Ear: External ear normal.  Mouth/Throat: Oropharynx is clear and moist.  Eyes: Conjunctivae and EOM are normal. Pupils are equal, round, and reactive to light.  Neck: Normal range of motion. Neck supple. No thyromegaly present.  Cardiovascular: Normal rate, regular rhythm, normal heart sounds and intact distal pulses.   Pulmonary/Chest: Effort normal and breath sounds normal.  Abdominal: Soft. Bowel sounds are normal. She exhibits no mass. There is no tenderness.  Musculoskeletal: Normal range of motion.  Lymphadenopathy:    She has no cervical adenopathy.  Neurological: She is alert and oriented to person, place, and time.  Skin: Skin is warm and dry. Rash noted.       Patient had an erythematous rash in the left groin and labial area a few erythematous papules also noted  Psychiatric: She has a normal mood and affect. Her behavior is normal.          Assessment & Plan:   Hypertension well controlled. In view of her hyponatremia we'll discontinue hydrochlorothiazide and maintain on lisinopril 20 mg daily only Nonspecific dermatitis left groin. We'll treat with Gyne-Lotrimin  Recheck 3 months

## 2011-01-07 ENCOUNTER — Other Ambulatory Visit (INDEPENDENT_AMBULATORY_CARE_PROVIDER_SITE_OTHER): Payer: BC Managed Care – PPO

## 2011-01-07 DIAGNOSIS — K909 Intestinal malabsorption, unspecified: Secondary | ICD-10-CM

## 2011-01-07 DIAGNOSIS — D7589 Other specified diseases of blood and blood-forming organs: Secondary | ICD-10-CM

## 2011-01-07 LAB — COMPREHENSIVE METABOLIC PANEL
Alkaline Phosphatase: 65 U/L (ref 39–117)
BUN: 9 mg/dL (ref 6–23)
Creatinine, Ser: 0.4 mg/dL (ref 0.4–1.2)
GFR: 184.32 mL/min (ref >60.00)
Glucose, Bld: 87 mg/dL (ref 70–99)
Sodium: 132 mEq/L — ABNORMAL LOW (ref 135–145)
Total Bilirubin: 0.3 mg/dL (ref 0.3–1.2)
Total Protein: 5.3 g/dL — ABNORMAL LOW (ref 6.0–8.3)

## 2011-01-07 LAB — TSH: TSH: 0.91 u[IU]/mL (ref 0.35–5.50)

## 2011-01-07 LAB — VITAMIN B12: Vitamin B-12: 192 pg/mL — ABNORMAL LOW (ref 211–911)

## 2011-01-08 ENCOUNTER — Ambulatory Visit (INDEPENDENT_AMBULATORY_CARE_PROVIDER_SITE_OTHER): Payer: BC Managed Care – PPO | Admitting: Internal Medicine

## 2011-01-08 DIAGNOSIS — E538 Deficiency of other specified B group vitamins: Secondary | ICD-10-CM

## 2011-01-08 MED ORDER — CYANOCOBALAMIN 1000 MCG/ML IJ SOLN
1000.0000 ug | Freq: Once | INTRAMUSCULAR | Status: AC
  Administered 2011-01-08: 1000 ug via INTRAMUSCULAR

## 2011-01-08 NOTE — Progress Notes (Signed)
Quick Note:  Spoke with pt - informed of lab - will stop by today for B12 injection - per prefers to come here monthly for injection ______

## 2011-01-19 ENCOUNTER — Emergency Department (HOSPITAL_COMMUNITY): Payer: BC Managed Care – PPO

## 2011-01-19 ENCOUNTER — Encounter (HOSPITAL_COMMUNITY): Payer: Self-pay | Admitting: Radiology

## 2011-01-19 ENCOUNTER — Emergency Department (HOSPITAL_COMMUNITY)
Admission: EM | Admit: 2011-01-19 | Discharge: 2011-01-19 | Disposition: A | Discharge status: Home / Self Care | Payer: BC Managed Care – PPO | Attending: Emergency Medicine | Admitting: Emergency Medicine

## 2011-01-19 DIAGNOSIS — R141 Gas pain: Secondary | ICD-10-CM | POA: Insufficient documentation

## 2011-01-19 DIAGNOSIS — R197 Diarrhea, unspecified: Secondary | ICD-10-CM | POA: Insufficient documentation

## 2011-01-19 DIAGNOSIS — R142 Eructation: Secondary | ICD-10-CM | POA: Insufficient documentation

## 2011-01-19 DIAGNOSIS — Z79899 Other long term (current) drug therapy: Secondary | ICD-10-CM | POA: Insufficient documentation

## 2011-01-19 DIAGNOSIS — R1032 Left lower quadrant pain: Secondary | ICD-10-CM | POA: Insufficient documentation

## 2011-01-19 DIAGNOSIS — F329 Major depressive disorder, single episode, unspecified: Secondary | ICD-10-CM | POA: Insufficient documentation

## 2011-01-19 DIAGNOSIS — R143 Flatulence: Secondary | ICD-10-CM | POA: Insufficient documentation

## 2011-01-19 DIAGNOSIS — Z8711 Personal history of peptic ulcer disease: Secondary | ICD-10-CM | POA: Insufficient documentation

## 2011-01-19 DIAGNOSIS — F3289 Other specified depressive episodes: Secondary | ICD-10-CM | POA: Insufficient documentation

## 2011-01-19 LAB — POCT I-STAT, CHEM 8
Chloride: 97 mEq/L (ref 96–112)
HCT: 42 % (ref 36.0–46.0)
Hemoglobin: 14.3 g/dL (ref 12.0–15.0)
Potassium: 4.2 mEq/L (ref 3.5–5.1)
Sodium: 132 mEq/L — ABNORMAL LOW (ref 135–145)

## 2011-01-19 LAB — URINALYSIS, ROUTINE W REFLEX MICROSCOPIC
Leukocytes, UA: NEGATIVE
Nitrite: NEGATIVE
Specific Gravity, Urine: 1.018 (ref 1.005–1.030)
Urobilinogen, UA: 0.2 mg/dL (ref 0.0–1.0)
pH: 6.5 (ref 5.0–8.0)

## 2011-01-19 LAB — DIFFERENTIAL
Basophils Absolute: 0 10*3/uL (ref 0.0–0.1)
Basophils Relative: 0 % (ref 0–1)
Eosinophils Relative: 0 % (ref 0–5)
Lymphocytes Relative: 10 % — ABNORMAL LOW (ref 12–46)
Neutro Abs: 9.9 10*3/uL — ABNORMAL HIGH (ref 1.7–7.7)

## 2011-01-19 LAB — CBC
HCT: 37.7 % (ref 36.0–46.0)
Hemoglobin: 13.2 g/dL (ref 12.0–15.0)
MCHC: 35 g/dL (ref 30.0–36.0)
RDW: 13.6 % (ref 11.5–15.5)
WBC: 12.2 10*3/uL — ABNORMAL HIGH (ref 4.0–10.5)

## 2011-01-19 MED ORDER — IOHEXOL 300 MG/ML  SOLN
80.0000 mL | Freq: Once | INTRAMUSCULAR | Status: AC | PRN
  Administered 2011-01-19: 80 mL via INTRAVENOUS

## 2011-01-27 ENCOUNTER — Telehealth: Payer: Self-pay | Admitting: Internal Medicine

## 2011-01-27 NOTE — Telephone Encounter (Signed)
Left message for patient to call back  

## 2011-01-27 NOTE — Telephone Encounter (Signed)
Patient is c/o abdominal pain at an old feeding tube site.  She is

## 2011-01-27 NOTE — Telephone Encounter (Signed)
She is asking if the feeding tube site pain could be scar tissue.  I have scheduled her an office visit for 02/03/11 to discuss further.  She also wants to talk to [facility] to see what they think because they put the tube in. She will call back if she wants to cancel the appt next week.

## 2011-02-03 ENCOUNTER — Ambulatory Visit (INDEPENDENT_AMBULATORY_CARE_PROVIDER_SITE_OTHER): Payer: BC Managed Care – PPO | Admitting: Internal Medicine

## 2011-02-03 ENCOUNTER — Other Ambulatory Visit (INDEPENDENT_AMBULATORY_CARE_PROVIDER_SITE_OTHER): Payer: BC Managed Care – PPO

## 2011-02-03 VITALS — BP 120/64 | HR 100 | Ht 63.0 in | Wt 95.6 lb

## 2011-02-03 DIAGNOSIS — R1032 Left lower quadrant pain: Secondary | ICD-10-CM

## 2011-02-03 DIAGNOSIS — D509 Iron deficiency anemia, unspecified: Secondary | ICD-10-CM

## 2011-02-03 DIAGNOSIS — E538 Deficiency of other specified B group vitamins: Secondary | ICD-10-CM

## 2011-02-03 DIAGNOSIS — K269 Duodenal ulcer, unspecified as acute or chronic, without hemorrhage or perforation: Secondary | ICD-10-CM

## 2011-02-03 LAB — FERRITIN: Ferritin: 3.8 ng/mL — ABNORMAL LOW (ref 10.0–291.0)

## 2011-02-03 MED ORDER — CIPROFLOXACIN HCL 250 MG PO TABS: 250.0000 mg | ORAL_TABLET | Freq: Two times a day (BID) | ORAL | Status: AC

## 2011-02-03 MED ORDER — DICYCLOMINE HCL 10 MG PO CAPS: 10.0000 mg | ORAL_CAPSULE | Freq: Four times a day (QID) | ORAL | Status: DC | PRN

## 2011-02-03 MED ORDER — METRONIDAZOLE 250 MG PO TABS: 250.0000 mg | ORAL_TABLET | Freq: Three times a day (TID) | ORAL | Status: AC

## 2011-02-03 NOTE — Assessment & Plan Note (Signed)
Ferritin recheck on oral iron

## 2011-02-03 NOTE — Patient Instructions (Addendum)
Please go to the basement upon leaving today to have your labs done. Your prescription(s) has(have) been sent to your pharmacy for you to pick up. You have been treated for Diverticulitis, please call us in 2 weeks with an update on your condition. We will contact you with assessment and plans when we get the results of your labs.

## 2011-02-03 NOTE — Progress Notes (Signed)
  Subjective:    Patient ID: [name], female    DOB: 05/13/1951, 60 y.o.   MRN: [id number]  HPI This is a 60 year old white woman known to me from prior ulcer disease and hemigastrectomy for same. She has malabsorption problems of B12 and iron. Recently had hyponatremia which resolved with stopping her diuretic, coordinated by primary care physician. She now returns because of a left lower quadrant pain which has been coming on over the past several weeks. Intermittent, somewhat related to movement but not always. She has some chronic constipation on MiraLax. Her bowel habits really have not changed and she does not report any bleeding. They're very sharp and intermittent pain it sounds like it's worsening somewhat. She had a CT scan of the abdomen and pelvis on July 1 which did not show any cause of pain, this is reviewed.  Denies fever. No urinary symptoms reported. Note that she is taking her by mouth iron. She has had her B12 injections changed monthly. She has been busy obtaining her GED at this point.  Remains abstinent from alcohol but tells me she used some NyQuil when she had a test positive for alcohol lately.   Review of Systems As per history of present illness.    Objective:   Physical Exam Thin NAD Eyes anicteric Lungs clear No CVAT Heart S1S2 no gallops or murmurs abd w/LLQ tenderness to deep palpation in region of prior gastrostomy without rebound, guarding Skin no rashes       Assessment & Plan:

## 2011-02-04 ENCOUNTER — Encounter: Payer: Self-pay | Admitting: Internal Medicine

## 2011-02-04 DIAGNOSIS — R1032 Left lower quadrant pain: Secondary | ICD-10-CM | POA: Insufficient documentation

## 2011-02-04 NOTE — Assessment & Plan Note (Signed)
Etiology not clear - could be diverticulitis though CT did not show that on 7/1 I think possible occult disease. Try course of antibiotics (cipro/metronidazole) and ant-spasmodic.

## 2011-02-04 NOTE — Progress Notes (Signed)
Quick Note:  Ferritin still low Oral iron not working due to prior surgery unable to absorb She needs an iron infusion arranged and a ferritin/cbc 6 weeks after that done Please check what she had before - think she receeived ferraheme and should repeat due to iron-deficiency anemia ______

## 2011-02-07 ENCOUNTER — Telehealth: Payer: Self-pay

## 2011-02-07 ENCOUNTER — Ambulatory Visit (INDEPENDENT_AMBULATORY_CARE_PROVIDER_SITE_OTHER): Payer: BC Managed Care – PPO | Admitting: Internal Medicine

## 2011-02-07 DIAGNOSIS — E538 Deficiency of other specified B group vitamins: Secondary | ICD-10-CM

## 2011-02-07 DIAGNOSIS — D649 Anemia, unspecified: Secondary | ICD-10-CM

## 2011-02-07 MED ORDER — CYANOCOBALAMIN 1000 MCG/ML IJ SOLN
1000.0000 ug | Freq: Once | INTRAMUSCULAR | Status: AC
  Administered 2011-02-07: 1000 ug via INTRAMUSCULAR

## 2011-02-07 MED ORDER — FERUMOXYTOL INJECTION 510 MG/17 ML: INTRAVENOUS | Status: DC

## 2011-02-07 NOTE — Telephone Encounter (Signed)
Message copied by Annett Fabian on Fri Feb 07, 2011  3:23 PM ------      Message from: Iva Boop      Created: Tue Feb 04, 2011  6:53 PM       Ferritin still low      Oral iron not working due to prior surgery unable to absorb      She needs an iron infusion arranged and a ferritin/cbc 6 weeks after that done      Please check what she had before - think she receeived ferraheme and should repeat due to iron-deficiency anemia

## 2011-02-07 NOTE — Telephone Encounter (Signed)
Patient advised.  She is scheduled with [facility] short stay for the first injection 02/19/11 9:00, they will arrange the second infusion with her when she goes on 02/19/11.  New lab orders entered for 04/09/11, 6 weeks after the second infusion.

## 2011-02-18 ENCOUNTER — Other Ambulatory Visit: Payer: Self-pay | Admitting: Internal Medicine

## 2011-02-18 DIAGNOSIS — D649 Anemia, unspecified: Secondary | ICD-10-CM

## 2011-02-18 MED ORDER — FERUMOXYTOL INJECTION 510 MG/17 ML: INTRAVENOUS | Status: AC

## 2011-02-19 ENCOUNTER — Encounter (HOSPITAL_COMMUNITY): Payer: BC Managed Care – PPO | Attending: Internal Medicine

## 2011-02-19 DIAGNOSIS — D509 Iron deficiency anemia, unspecified: Secondary | ICD-10-CM | POA: Insufficient documentation

## 2011-02-19 DIAGNOSIS — I10 Essential (primary) hypertension: Secondary | ICD-10-CM | POA: Insufficient documentation

## 2011-02-19 DIAGNOSIS — Z79899 Other long term (current) drug therapy: Secondary | ICD-10-CM | POA: Insufficient documentation

## 2011-02-22 ENCOUNTER — Inpatient Hospital Stay (HOSPITAL_COMMUNITY)
Admission: EM | Admit: 2011-02-22 | Discharge: 2011-02-25 | DRG: 750 | Disposition: A | Discharge status: Home / Self Care | Payer: BC Managed Care – PPO | Attending: Internal Medicine | Admitting: Internal Medicine

## 2011-02-22 ENCOUNTER — Emergency Department (HOSPITAL_COMMUNITY): Payer: BC Managed Care – PPO

## 2011-02-22 DIAGNOSIS — G9349 Other encephalopathy: Secondary | ICD-10-CM | POA: Diagnosis present

## 2011-02-22 DIAGNOSIS — F10239 Alcohol dependence with withdrawal, unspecified: Principal | ICD-10-CM | POA: Diagnosis present

## 2011-02-22 DIAGNOSIS — F1020 Alcohol dependence, uncomplicated: Secondary | ICD-10-CM | POA: Diagnosis present

## 2011-02-22 DIAGNOSIS — I10 Essential (primary) hypertension: Secondary | ICD-10-CM | POA: Diagnosis present

## 2011-02-22 DIAGNOSIS — Z79899 Other long term (current) drug therapy: Secondary | ICD-10-CM

## 2011-02-22 DIAGNOSIS — F10939 Alcohol use, unspecified with withdrawal, unspecified: Principal | ICD-10-CM | POA: Diagnosis present

## 2011-02-22 DIAGNOSIS — E8809 Other disorders of plasma-protein metabolism, not elsewhere classified: Secondary | ICD-10-CM | POA: Diagnosis present

## 2011-02-22 DIAGNOSIS — E871 Hypo-osmolality and hyponatremia: Secondary | ICD-10-CM | POA: Diagnosis present

## 2011-02-22 DIAGNOSIS — E876 Hypokalemia: Secondary | ICD-10-CM | POA: Diagnosis present

## 2011-02-22 LAB — COMPREHENSIVE METABOLIC PANEL
ALT: 6 U/L (ref 0–35)
AST: 30 U/L (ref 0–37)
Alkaline Phosphatase: 84 U/L (ref 39–117)
CO2: 17 mEq/L — ABNORMAL LOW (ref 19–32)
Chloride: 97 mEq/L (ref 96–112)
GFR calc non Af Amer: >60 mL/min (ref >60)
Sodium: 131 mEq/L — ABNORMAL LOW (ref 135–145)
Total Bilirubin: <0.1 mg/dL — ABNORMAL LOW (ref 0.3–1.2)

## 2011-02-22 LAB — DIFFERENTIAL
Basophils Relative: 0 % (ref 0–1)
Eosinophils Absolute: 0 10*3/uL (ref 0.0–0.7)
Lymphs Abs: 1.7 10*3/uL (ref 0.7–4.0)
Neutro Abs: 8.1 10*3/uL — ABNORMAL HIGH (ref 1.7–7.7)
Neutrophils Relative %: 75 % (ref 43–77)

## 2011-02-22 LAB — URINALYSIS, ROUTINE W REFLEX MICROSCOPIC
Bilirubin Urine: NEGATIVE
Glucose, UA: NEGATIVE mg/dL
Ketones, ur: 15 mg/dL — AB
Leukocytes, UA: NEGATIVE
pH: 5 (ref 5.0–8.0)

## 2011-02-22 LAB — RAPID URINE DRUG SCREEN, HOSP PERFORMED
Amphetamines: NOT DETECTED
Benzodiazepines: POSITIVE — AB

## 2011-02-22 LAB — CBC
Platelets: 618 10*3/uL — ABNORMAL HIGH (ref 150–400)
RBC: 3.3 MIL/uL — ABNORMAL LOW (ref 3.87–5.11)
WBC: 10.9 10*3/uL — ABNORMAL HIGH (ref 4.0–10.5)

## 2011-02-22 LAB — MAGNESIUM: Magnesium: 2.5 mg/dL (ref 1.5–2.5)

## 2011-02-23 DIAGNOSIS — F4320 Adjustment disorder, unspecified: Secondary | ICD-10-CM

## 2011-02-23 LAB — BASIC METABOLIC PANEL
BUN: 12 mg/dL (ref 6–23)
Calcium: 7.2 mg/dL — ABNORMAL LOW (ref 8.4–10.5)
Creatinine, Ser: 0.54 mg/dL (ref 0.50–1.10)
GFR calc Af Amer: >60 mL/min (ref >60)
GFR calc non Af Amer: >60 mL/min (ref >60)
Potassium: 3.1 mEq/L — ABNORMAL LOW (ref 3.5–5.1)

## 2011-02-23 LAB — CBC
HCT: 28.5 % — ABNORMAL LOW (ref 36.0–46.0)
MCHC: 35.1 g/dL (ref 30.0–36.0)
RDW: 13.2 % (ref 11.5–15.5)

## 2011-02-24 ENCOUNTER — Telehealth: Payer: Self-pay | Admitting: Internal Medicine

## 2011-02-24 LAB — BASIC METABOLIC PANEL
Calcium: 7.7 mg/dL — ABNORMAL LOW (ref 8.4–10.5)
Chloride: 107 mEq/L (ref 96–112)
Creatinine, Ser: <0.47 mg/dL — ABNORMAL LOW (ref 0.50–1.10)

## 2011-02-24 NOTE — Telephone Encounter (Signed)
Cancelled pts appt for this Wed. For iron at short stay. Rescheduled the iron infusion for 03/05/11@8 :30am at [facility] short stay. Message left for pts husband.

## 2011-02-25 DIAGNOSIS — F4320 Adjustment disorder, unspecified: Secondary | ICD-10-CM

## 2011-02-26 ENCOUNTER — Other Ambulatory Visit: Payer: Self-pay | Admitting: Internal Medicine

## 2011-02-26 MED ORDER — ALPRAZOLAM 0.5 MG PO TABS: 0.5000 mg | ORAL_TABLET | Freq: Every evening | ORAL | Status: DC | PRN

## 2011-02-26 NOTE — Telephone Encounter (Signed)
Called into [facility]. kik

## 2011-02-26 NOTE — Telephone Encounter (Signed)
Pt requesting refill on ALPRAZolam (XANAX) 0.5 MG tablet  ° °

## 2011-02-27 NOTE — Consult Note (Signed)
  NAMETRAMEKA, DOROUGH                  ACCOUNT NO.:  [id number]  MEDICAL RECORD NO.:  [id number]  LOCATION:  4729                         FACILITY:  [facility]  PHYSICIAN:  Eulogio Ditch, MD DATE OF BIRTH:  May 31, 1951  DATE OF CONSULTATION:  02/23/2011 DATE OF DISCHARGE:                                CONSULTATION   REASON FOR CONSULTATION:  Alcohol abuse.  HISTORY OF PRESENT ILLNESS:  This is a 60 year old female with a long history of chronic alcohol abuse who was brought to the hospital by the husband because of worsening, confusion and combativeness over the past 2-3 days.  As per husband, the patient was not making sense and was becoming more hateful.  The husband reported that the patient generally drinks 6-7 beers every other day for a long period of time.  I asked the patient whether she wants to stop abusing alcohol and wants to go to rehab after detox, the patient said, "I don't know why the husband brought me here.  I don't want to stop drinking."  Then later on, she was saying that she came to the hospital because she wants to stop drinking.  The patient was not providing a logical history.  When I asked her questions, she was asking the husband to answer the questions and was saying, "I don't know what to say.  I don't why I'm in the hospital."  The patient's CT scan was done of the head and was negative.  The patient denies being admitted to Psychiatry in the past.  Denied abuse of any other illicit drugs.  The patient is currently not on any psych medications.  PAST MEDICAL HISTORY: 1. Hypertension. 2. Peptic ulcer disease. 3. Subdural hematoma 2 years ago.  ALLERGIES:  THE PATIENT IS ALLERGIC TO PENICILLIN AND DILAUDID.  SOCIAL HISTORY:  The patient is married, lives with her husband, is currently not working, but as per husband the patient was looking for a job.  MENTAL STATUS EXAMINATION:  The patient is still confused, agitated, and irritable on and off.   Denies visual hallucinations.  Still has shakiness in the hand.  The patient is alert, awake, and oriented x3. She was able to tell me the month and the year, and the name of the hospital.  Her memory is poor at this time.  Attention and concentration poor.  Abstraction ability poor.  Insight and judgment poor.  DIAGNOSES:  AXIS I:  Chronic alcohol dependence. AXIS II:  Deferred. AXIS III:  See medical notes. AXIS IV:  Chronic alcohol abuse. AXIS V:  40.  RECOMMENDATIONS: 1. I started the patient on Librium 50 mg 1 in the morning and 1 at     bedtime. 2. I will followup on this patient as needed.     Eulogio Ditch, MD     SA/MEDQ  D:  02/23/2011  T:  02/23/2011  Job:  [id number]  Electronically Signed by Eulogio Ditch  on 02/27/2011 08:55:48 AM

## 2011-02-28 ENCOUNTER — Telehealth: Payer: Self-pay | Admitting: Internal Medicine

## 2011-02-28 NOTE — Telephone Encounter (Signed)
Chart review shows she was just discharged from hospital this week for alcoholism and withdrawal issues. She can schedule a non-urgent visit with PCP or me about this but since she was just in hospital and records do not indicate any problem with this then do not expect any pain meds.  Avoid alcohol.

## 2011-02-28 NOTE — Telephone Encounter (Signed)
Patient calling to report starting last week, she is having cramping, spasm like pain in the area of her former feeding tube site. States the pain started as off and on but is now constant. She is using Tylenol and a heating pad without relief. She is using Miralax and bowel movements are regular. Denies nausea, vomiting or fever. Please, advise.

## 2011-02-28 NOTE — Telephone Encounter (Signed)
Left a message for patient on her cell per her request with Dr. Marvell Fuller recommendations.

## 2011-03-01 ENCOUNTER — Encounter: Payer: Self-pay | Admitting: Family Medicine

## 2011-03-01 ENCOUNTER — Ambulatory Visit (INDEPENDENT_AMBULATORY_CARE_PROVIDER_SITE_OTHER): Payer: BC Managed Care – PPO | Admitting: Family Medicine

## 2011-03-01 ENCOUNTER — Other Ambulatory Visit: Payer: Self-pay | Admitting: Family Medicine

## 2011-03-01 VITALS — BP 150/76 | HR 96 | Temp 97.0°F | Ht 61.0 in | Wt 96.0 lb

## 2011-03-01 DIAGNOSIS — R109 Unspecified abdominal pain: Secondary | ICD-10-CM

## 2011-03-01 LAB — CBC WITH DIFFERENTIAL/PLATELET
Eosinophils Absolute: 0 10*3/uL (ref 0.0–0.7)
Eosinophils Relative: 0 % (ref 0–5)
Lymphs Abs: 1 10*3/uL (ref 0.7–4.0)
MCH: 33.3 pg (ref 26.0–34.0)
MCHC: 33.6 g/dL (ref 30.0–36.0)
MCV: 99.1 fL (ref 78.0–100.0)
Monocytes Relative: 7 % (ref 3–12)
Platelets: 546 10*3/uL — ABNORMAL HIGH (ref 150–400)
RBC: 3.39 MIL/uL — ABNORMAL LOW (ref 3.87–5.11)

## 2011-03-01 MED ORDER — HYDROCODONE-ACETAMINOPHEN 5-325 MG PO TABS: 1.0000 | ORAL_TABLET | Freq: Three times a day (TID) | ORAL | Status: DC | PRN

## 2011-03-01 NOTE — H&P (Signed)
Kirsten, Rivera NO.:  [id number]  MEDICAL RECORD NO.:  [id number]  LOCATION:  4729                         FACILITY:  [facility]  PHYSICIAN:  Della Goo, M.D. DATE OF BIRTH:  [birth year]  DATE OF ADMISSION:  02/22/2011 DATE OF DISCHARGE:                             HISTORY & PHYSICAL   PRIMARY CARE PHYSICIAN:  Gordy Savers, MD, [place].  CHIEF COMPLAINT:  Agitation, confusion.  HISTORY OF PRESENT ILLNESS:  This is a 60 year old female with history of alcoholism, who was brought to the emergency department by her husband secondary to worsening confusion and combativeness over the past 48 hours.  The patient states that she drinks a six-pack of beer every other day and has done so for a while.  She stated that she decided to quit.  [name] states she has been talking out of her head and not making sense and has been very agitated and combative and out of control, which has been worsening.  The patient was seen in the emergency department and was administered IV Ativan x1 dose and given IV fluids and referred for medical admission.  A CT scan of her head was also performed and was negative for any acute findings.  PAST MEDICAL HISTORY:  Significant for: 1. Hypertension. 2. Alcoholism. 3. History of peptic ulcer disease. 4. Status post Roux-en-Y surgery. 5. History of subdural hematoma 2 years ago.  MEDICATIONS:  At this time include lisinopril and alprazolam.  ALLERGIES:  PENICILLIN which causes anaphylaxis and DILAUDID which causes itching.  SOCIAL HISTORY:  The patient is married.  She is a nonsmoker and she drinks 6 beers every other day and has done so for many years.  FAMILY HISTORY:  Noncontributory.  REVIEW OF SYSTEMS:  Pertinent as mentioned above.  PHYSICAL EXAMINATION FINDINGS:  GENERAL:  This is a thin older than stated age appearing 60 year old Caucasian female, who is in no acute distress. VITAL SIGNS:   Temperature 98.1, blood pressure 106/63, heart rate 87, respirations 20, O2 saturations 100%. HEENT:  Normocephalic and atraumatic.  Pupils equally round and reactive to light.  Extraocular movements are intact.  Funduscopic benign.  There is no scleral icterus.  Nares are patent bilaterally.  Oropharynx is clear. NECK:  Supple.  Full range of motion.  No thyromegaly, adenopathy.  No jugular venous distention. CARDIOVASCULAR:  Regular rate and rhythm.  Normal S1 and S2.  No murmurs, gallops, or rubs appreciated. LUNGS:  Clear to auscultation bilaterally.  Chest wall excursion is symmetric.  Breathing is unlabored and chest wall is nontender. ABDOMEN:  Positive bowel sounds, soft, nontender, nondistended.  No hepatosplenomegaly.  There is a large midline surgical scar present, which is old. EXTREMITIES:  Without cyanosis, clubbing, or edema. NEUROLOGIC:  The patient is alert and oriented x3.  She is mildly agitated.  Her speech is clear at this time.  Cranial nerves are intact. Motor and sensory function also intact.  LABORATORY STUDIES:  White blood cell count 10.9, hemoglobin 11.4, hematocrit 32.1, platelets 618, neutrophils 75%, lymphocytes 15%. Sodium 131, potassium 2.8, chloride 97, CO2 of 17, BUN 13, creatinine 0.76, glucose 118, albumin 2.5, calcium 7.5, the corrected  calcium is 9.0, alcohol level less than 11.  Urinalysis negative.  Ammonia level 24.  Urine drug screen positive for benzodiazepines.  CT scan of the head performed and negative for any findings of acute hemorrhage infarction, mass effect, mass lesion.  ASSESSMENT:  A 60 year old female being admitted with: 1. Alcohol withdrawal. 2. Confusion/encephalopathy secondary to alcohol withdrawal. 3. Hypokalemia. 4. Hyponatremia. 5. Alcohol abuse.  PLAN:  The patient will be admitted.  She will be placed on the IV Ativan protocol and CIWA precautions for alcohol withdrawal.  Her potassium will be repleted.  Her  corrected calcium is 9.0, which is within the normal limits for her hypoalbuminemia.  Her magnesium level will also be checked and this will be repleted if needed.  Alcohol counseling has been initiated with this patient and will be requested as well for this patient so that she can consider alcohol rehabilitation therapy.  Her regular medication will be further verified.  She is a full code and DVT prophylaxis has been ordered.     Della Goo, M.D.     HJ/MEDQ  D:  02/22/2011  T:  02/22/2011  Job:  [id number]  cc:   Gordy Savers, MD  Electronically Signed by Della Goo M.D. on 03/01/2011 09:25:08 PM

## 2011-03-01 NOTE — Patient Instructions (Signed)
Go to the ED if your pain suddenly gets worse Go to the lab for complete blood count to see if any evidence of infection.
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
## 2011-03-03 ENCOUNTER — Encounter: Payer: Self-pay | Admitting: Internal Medicine

## 2011-03-03 ENCOUNTER — Ambulatory Visit (INDEPENDENT_AMBULATORY_CARE_PROVIDER_SITE_OTHER): Payer: BC Managed Care – PPO | Admitting: Internal Medicine

## 2011-03-03 DIAGNOSIS — K912 Postsurgical malabsorption, not elsewhere classified: Secondary | ICD-10-CM

## 2011-03-03 DIAGNOSIS — K469 Unspecified abdominal hernia without obstruction or gangrene: Secondary | ICD-10-CM

## 2011-03-03 DIAGNOSIS — R1032 Left lower quadrant pain: Secondary | ICD-10-CM

## 2011-03-03 NOTE — Progress Notes (Signed)
  Subjective:    Patient ID: [name], female    DOB: 12/07/1950, 60 y.o.   MRN: [id number]  HPI  60 year old patient who is seen today with a chief complaint of pain in the left midabdominal area. She was seen in the ED a few weeks ago and an abdominal CT scan on July 1 was nonrevealing. She has had a partial gastrectomy a number of years ago and she required a temporary PEG tube. The pain is about the scarring from her prior PEG site. She is having daily bowel movements and has had no nausea vomiting or fever. She was prescribed an analgesic 2 days ago at the [facility] which has not been very helpful. She is followed closely by Dr. 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   Review of Systems  Gastrointestinal: Positive for abdominal pain. Negative for nausea, vomiting, diarrhea, blood in stool and abdominal distention.       Objective:   Physical Exam  Constitutional: She appears well-developed and well-nourished. No distress.       No distress. Normal blood pressure temperature 98 degrees  Abdominal: Soft. Bowel sounds are normal.       Midline scar; A. PEG site scar was present in the left midabdominal area. There was an approximate 5- 6 cm firm rounded density in this area. It was not easily  Reduced and was tender to palpation          Assessment & Plan:   Probable symptomatic hernia. The patient will be rescheduled  for evaluation by general surgery in the morning. She was instructed to go to the ED if she develops worsening pain nausea vomiting or fever

## 2011-03-03 NOTE — Patient Instructions (Signed)
Report to the emergency department if you develop worsening pain nausea vomiting or fever General surgery evaluation August 14

## 2011-03-04 ENCOUNTER — Ambulatory Visit (INDEPENDENT_AMBULATORY_CARE_PROVIDER_SITE_OTHER): Payer: BC Managed Care – PPO | Admitting: General Surgery

## 2011-03-04 ENCOUNTER — Encounter (INDEPENDENT_AMBULATORY_CARE_PROVIDER_SITE_OTHER): Payer: Self-pay | Admitting: General Surgery

## 2011-03-04 ENCOUNTER — Other Ambulatory Visit (INDEPENDENT_AMBULATORY_CARE_PROVIDER_SITE_OTHER): Payer: Self-pay

## 2011-03-04 VITALS — BP 142/78 | HR 54 | Temp 97.8°F

## 2011-03-04 DIAGNOSIS — R1012 Left upper quadrant pain: Secondary | ICD-10-CM | POA: Insufficient documentation

## 2011-03-04 MED ORDER — HYDROCODONE-ACETAMINOPHEN 5-325 MG PO TABS: 1.0000 | ORAL_TABLET | Freq: Four times a day (QID) | ORAL | Status: DC | PRN

## 2011-03-04 MED ORDER — LEVOFLOXACIN 750 MG PO TABS: 750.0000 mg | ORAL_TABLET | Freq: Every day | ORAL | Status: AC

## 2011-03-04 MED ORDER — METRONIDAZOLE 500 MG PO TABS: 500.0000 mg | ORAL_TABLET | Freq: Three times a day (TID) | ORAL | Status: DC

## 2011-03-04 NOTE — Progress Notes (Signed)
Chief Complaint  Patient presents with  . Other    eval of inguinal hernia    HISTORY: Pt is a 60 yo F with around 6-8 weeks of left sided abdominal pain.  She saw Dr. [name] who treated her with one week for diverticulitis.  She did not take both of her medications because she said that the drug (presumed cipro) that was not the "M drug" (presumably metronidazole) made her sick.  She has occasional nausea, does not have fevers/chills.  She does not note relieving factors.  She has some worsening pain with food.  She has had a CT scan which was normal July 1, but had different pain in location and severity at that time.    Past Medical History  Diagnosis Date  . ALCOHOL ABUSE 02/02/2009  . ANXIETY 01/21/2007  . DIVERTICULOSIS 07/09/2007  . Duodenal ulcer 12/19/2008  . Headache 05/31/2007  . HYPERTENSION 06/23/2007  . HYPONATREMIA 02/02/2009    better off diuretic  . INSOMNIA 05/31/2007  . OSTEOPOROSIS 01/21/2007  . Raynaud's syndrome 06/23/2007  . VITAMIN B12 DEFICIENCY 10/02/2009  . Iron deficiency anemia   . Zoster 2012    twice - left groin/vagina     Current Outpatient Prescriptions  Medication Sig Dispense Refill  . lisinopril (PRINIVIL,ZESTRIL) 20 MG tablet Take 1 tablet (20 mg total) by mouth daily.  90 tablet  6  . ferrous sulfate ([facility] IRON) 325 (65 FE) MG tablet Take 325 mg by mouth daily with breakfast.        . HYDROcodone-acetaminophen (NORCO) 5-325 MG per tablet Take 1 tablet by mouth every 8 (eight) hours as needed for pain.  30 tablet  0     Allergies  Allergen Reactions  . Hydromorphone Hcl   . Penicillins     REACTION: unspecified  . Sulfonamide Derivatives      Family History  Problem Relation Age of Onset  . Stroke Father      History   Social History  . Marital Status: Married    Spouse Name: N/A    Number of Children: 1  . Years of Education: N/A   Occupational History  . FT student    Social History Main Topics  . Smoking status: Never  Smoker   . Smokeless tobacco: Never Used  . Alcohol Use: No  . Drug Use: No  . Sexually Active: None   Other Topics Concern  . None   Social History Narrative  . None     REVIEW OF SYSTEMS - PERTINENT POSITIVES ONLY: None other positive other than the HPI and PMH.     EXAM: Filed Vitals:   03/04/11 1452  BP: 142/78  Pulse: 54  Temp: 97.8 F (36.6 C)    GENERAL:  Pt is very thin.   Head: Normocephalic and atraumatic.  Eyes: Conjunctivae are normal. Pupils are equal, round, and reactive to light. No scleral icterus.  Neck: Normal range of motion. Neck supple. No tracheal deviation present. No thyromegaly present.  Cardiovascular: Normal rate, regular rhythm, normal heart sounds and intact distal pulses.  Exam reveals no gallop and no friction rub.   No murmur heard. Respiratory: Effort normal and breath sounds normal. No respiratory distress. No wheezes, rales or rhonchi, no chest wall tenderness.  GI: Soft. Bowel sounds are normal. The abdomen is soft tender in the LUQ.  There is swelling along upper left abdomen consistent with the transverse colon.  There is no rebound and no guarding. A hernia defect is  not palpable Musculoskeletal: Normal range of motion. Extremities are nontender.  Lymphadenopathy: No cervical, preauricular, postauricular or axillary adenopathy is present Neurological: Alert and oriented to person, place, and time. Coordination normal.  Skin: Skin is warm and dry. No rash noted. No diaphoresis. No erythema. No pallor.  Psychiatric: Pt seems anxious.   Behavior is normal. Judgment and thought content normal.    LABORATORY RESULTS: Labs are reviewed    RADIOLOGY RESULTS: See E-Chart or I-Site for most recent results.  Images and reports are reviewed.    ASSESSMENT AND PLAN:   LUQ abdominal pain near former PEG site Pt does not appear to have a hernia.  She does have swelling in the LUQ, but this feels like the transverse colon going across the  abdomen.  This would be consistent with where the colon is located on the scan.   I am going to order another CT scan of the abdomen during this acute phase.  I suspect that the patient has severe constipation, but certainly could have colitis.        Maudry Diego MD Surgical Oncology, General and Endocrine Surgery [facility]      Visit Diagnoses: 1. LUQ abdominal pain near former PEG site     Primary Care Physician: Rogelia Boga, MD

## 2011-03-04 NOTE — Assessment & Plan Note (Addendum)
Pt does not appear to have a hernia.  She does have swelling in the LUQ, but this feels like the transverse colon going across the abdomen.  This would be consistent with where the colon is located on the scan.   I was going to order another CT scan of the abdomen during this acute phase.  She is unwilling to undergo another CT scan.  I am going to treat her for diverticulitis.

## 2011-03-04 NOTE — Progress Notes (Signed)
Addended by: Almond Lint on: 03/04/2011 04:08 PM   Modules accepted: Orders

## 2011-03-05 ENCOUNTER — Telehealth (INDEPENDENT_AMBULATORY_CARE_PROVIDER_SITE_OTHER): Payer: Self-pay

## 2011-03-05 ENCOUNTER — Encounter (HOSPITAL_COMMUNITY): Payer: BC Managed Care – PPO

## 2011-03-05 ENCOUNTER — Other Ambulatory Visit: Payer: Self-pay | Admitting: Internal Medicine

## 2011-03-05 ENCOUNTER — Other Ambulatory Visit (INDEPENDENT_AMBULATORY_CARE_PROVIDER_SITE_OTHER): Payer: Self-pay | Admitting: General Surgery

## 2011-03-05 LAB — CBC
MCHC: 33.6 g/dL (ref 30.0–36.0)
Platelets: 428 10*3/uL — ABNORMAL HIGH (ref 150–400)
RDW: 13.8 % (ref 11.5–15.5)
WBC: 6.2 10*3/uL (ref 4.0–10.5)

## 2011-03-05 LAB — COMPREHENSIVE METABOLIC PANEL
ALT: 9 U/L (ref 0–35)
AST: 18 U/L (ref 0–37)
Albumin: 2.8 g/dL — ABNORMAL LOW (ref 3.5–5.2)
Chloride: 97 mEq/L (ref 96–112)
Creatinine, Ser: <0.47 mg/dL — ABNORMAL LOW (ref 0.50–1.10)
Potassium: 4.2 mEq/L (ref 3.5–5.1)
Sodium: 131 mEq/L — ABNORMAL LOW (ref 135–145)
Total Bilirubin: 0.1 mg/dL — ABNORMAL LOW (ref 0.3–1.2)

## 2011-03-05 LAB — DIFFERENTIAL
Basophils Absolute: 0 10*3/uL (ref 0.0–0.1)
Basophils Relative: 0 % (ref 0–1)
Lymphocytes Relative: 20 % (ref 12–46)
Monocytes Absolute: 0.5 10*3/uL (ref 0.1–1.0)
Neutro Abs: 4.4 10*3/uL (ref 1.7–7.7)
Neutrophils Relative %: 71 % (ref 43–77)

## 2011-03-05 NOTE — Telephone Encounter (Signed)
Called in Norco 5/325 #30 1-2 po q 4-6 hrs prn pain w/ no refills.  Pt aware.

## 2011-03-07 NOTE — Progress Notes (Signed)
Quick Note:  No major abnormalities - ? If I ordered Na almost normal ______

## 2011-03-10 ENCOUNTER — Ambulatory Visit: Payer: BC Managed Care – PPO

## 2011-03-10 ENCOUNTER — Ambulatory Visit: Payer: BC Managed Care – PPO | Admitting: Internal Medicine

## 2011-03-10 ENCOUNTER — Encounter: Payer: Self-pay | Admitting: Family Medicine

## 2011-03-10 ENCOUNTER — Ambulatory Visit (INDEPENDENT_AMBULATORY_CARE_PROVIDER_SITE_OTHER): Payer: BC Managed Care – PPO | Admitting: Family Medicine

## 2011-03-10 VITALS — BP 110/70 | Temp 97.8°F | Wt 92.0 lb

## 2011-03-10 DIAGNOSIS — D649 Anemia, unspecified: Secondary | ICD-10-CM

## 2011-03-10 MED ORDER — CYANOCOBALAMIN 1000 MCG/ML IJ SOLN
1000.0000 ug | Freq: Once | INTRAMUSCULAR | Status: AC
  Administered 2011-03-10: 1000 ug via INTRAMUSCULAR

## 2011-03-10 NOTE — Patient Instructions (Signed)
Follow up immediately for any fever, vomiting, or worsening abdominal pain I encourage you to follow up with surgeon regarding your persistent abdominal pain.

## 2011-03-10 NOTE — Progress Notes (Signed)
  Subjective:    Patient ID: [name], female    DOB: 1950-11-28, 60 y.o.   MRN: [id number]  HPI Persistent left lower quadrant abdominal pain. Previous notes reviewed. Patient has remote history of peptic ulcer disease. Partial gastrectomy about 6 years ago with temporary PEG. Constant pain around previous PEG site. Recently saw gastroenterologist and treated with antibiotics for question of diverticulitis. Did not improve with antibiotics. Question of abdominal wall hernia. Surgeon did not feel evidence for hernia. CT scan repeat ordered but patient refused to go. She had CT scan early July which did not show any acute diverticulitis changes. Patient denies any nausea, vomiting, fever, chills, or change of bowel habits.  Pain is sharp and constant and rated 8-9/10. Recent CBC white blood count 6.2 thousand. Hemoglobin stable at 11.5. She has some chronic anemia related to poor iron absorption.  Past Medical History  Diagnosis Date  . ALCOHOL ABUSE 02/02/2009  . ANXIETY 01/21/2007  . DIVERTICULOSIS 07/09/2007  . Duodenal ulcer 12/19/2008  . Headache 05/31/2007  . HYPERTENSION 06/23/2007  . HYPONATREMIA 02/02/2009    better off diuretic  . INSOMNIA 05/31/2007  . OSTEOPOROSIS 01/21/2007  . Raynaud's syndrome 06/23/2007  . VITAMIN B12 DEFICIENCY 10/02/2009  . Iron deficiency anemia   . Zoster 2012    twice - left groin/vagina   Past Surgical History  Procedure Date  . Tubal ligation 1977  . Gastrojejunostomy 2006  . Small bowel endoscopy 12/21/2008    gastritis  . Upper gastrointestinal endoscopy 09/16/2007    Normal  . Colonoscopy 06/18/2007    angulated and stenotic sigmoid colon    reports that she has never smoked. She has never used smokeless tobacco. She reports that she does not drink alcohol or use illicit drugs. family history includes Stroke in her father. Allergies  Allergen Reactions  . Hydromorphone Hcl   . Penicillins     REACTION: unspecified  . Sulfonamide Derivatives         Review of Systems  Constitutional: Negative for fever, chills and fatigue.  Respiratory: Negative for shortness of breath.   Cardiovascular: Negative for chest pain.  Gastrointestinal: Positive for abdominal pain. Negative for nausea, vomiting, diarrhea, constipation, blood in stool, abdominal distention and rectal pain.  Genitourinary: Negative for dysuria.  Hematological: Negative for adenopathy. Does not bruise/bleed easily.       Objective:   Physical Exam  Constitutional: She appears well-developed and well-nourished.  HENT:  Mouth/Throat: Oropharynx is clear and moist.  Neck: Neck supple.  Cardiovascular: Normal rate and regular rhythm.   Pulmonary/Chest: Effort normal and breath sounds normal. No respiratory distress. She has no wheezes. She has no rales.  Abdominal: Soft. Bowel sounds are normal. She exhibits no distension and no mass. There is no rebound.       Patient has a scar left lower quadrant from previous PEG tube. She has some guarding to palpation of this region and tenderness localized to approximately 6-8 cm area surrounding the scar. No distinct masses palpated.          Assessment & Plan:  Persistent left lower quadrant abdominal pain. Question related to previous incision. Diverticulitis seems very unlikely with no fever, normal white count, and recent lack of response to antibiotics. She's had recent colonoscopy a couple years ago. We've recommended her following up with surgeon as pain seems to be peri-incisional.  No evidence for acute abdomen.

## 2011-03-11 ENCOUNTER — Other Ambulatory Visit (INDEPENDENT_AMBULATORY_CARE_PROVIDER_SITE_OTHER): Payer: Self-pay | Admitting: General Surgery

## 2011-03-12 ENCOUNTER — Telehealth (INDEPENDENT_AMBULATORY_CARE_PROVIDER_SITE_OTHER): Payer: Self-pay | Admitting: General Surgery

## 2011-03-12 NOTE — Telephone Encounter (Incomplete)
ACE inhibitor therapy was not prescribed due to {MEDS ACE REASONS NOT PRESCRIBED PQRI [id number]}.

## 2011-03-12 NOTE — Telephone Encounter (Signed)
MR. [name] CALLED ON 03-10-11 RE WIFE [name]. STATED PAIN ON LEFT SIDE IS NOT IMPROVED AFTER ANTIBIOTIC WAS CHANGED. I CONVEYED THIS MESSAGE TO DR. BYERLY LATE IN DAY AND SHE  REQ A CT SCAN BE ORDERED. DR. Charlestine Massed PT REFUSED CT WHEN SEEN RECENTLY IN OFFICE. I SPOKE WITH MR [name] ON Tuesday MORNING- 03-11-11 AND TOLD HIM A CT WOULD BE ORDERED FOR MS [name].

## 2011-03-12 NOTE — Telephone Encounter (Signed)
error 

## 2011-03-13 ENCOUNTER — Telehealth (INDEPENDENT_AMBULATORY_CARE_PROVIDER_SITE_OTHER): Payer: Self-pay

## 2011-03-13 NOTE — Telephone Encounter (Signed)
Received a call from Marisue Ivan at [facility] and she is requesting an order for the ct to be only with contrast and not without.  She had a ct on 01-19-11.  It will give her less radiation to do it without contrast and they will see the same information.  I called Dr Donell Beers and she recommends they can do it with contrast only.  I phoned the order to [place]

## 2011-03-14 ENCOUNTER — Encounter: Payer: Self-pay | Admitting: Internal Medicine

## 2011-03-14 ENCOUNTER — Ambulatory Visit (INDEPENDENT_AMBULATORY_CARE_PROVIDER_SITE_OTHER): Payer: BC Managed Care – PPO | Admitting: Internal Medicine

## 2011-03-14 ENCOUNTER — Ambulatory Visit
Admission: RE | Admit: 2011-03-14 | Discharge: 2011-03-14 | Disposition: A | Discharge status: Home / Self Care | Payer: BC Managed Care – PPO | Source: Ambulatory Visit | Attending: General Surgery | Admitting: General Surgery

## 2011-03-14 VITALS — BP 110/60 | HR 82 | Ht 63.0 in | Wt 89.0 lb

## 2011-03-14 DIAGNOSIS — F419 Anxiety disorder, unspecified: Secondary | ICD-10-CM

## 2011-03-14 DIAGNOSIS — F411 Generalized anxiety disorder: Secondary | ICD-10-CM

## 2011-03-14 DIAGNOSIS — G4700 Insomnia, unspecified: Secondary | ICD-10-CM

## 2011-03-14 DIAGNOSIS — R109 Unspecified abdominal pain: Secondary | ICD-10-CM

## 2011-03-14 DIAGNOSIS — R1012 Left upper quadrant pain: Secondary | ICD-10-CM

## 2011-03-14 DIAGNOSIS — F1020 Alcohol dependence, uncomplicated: Secondary | ICD-10-CM

## 2011-03-14 DIAGNOSIS — R933 Abnormal findings on diagnostic imaging of other parts of digestive tract: Secondary | ICD-10-CM

## 2011-03-14 DIAGNOSIS — R1032 Left lower quadrant pain: Secondary | ICD-10-CM

## 2011-03-14 DIAGNOSIS — B029 Zoster without complications: Secondary | ICD-10-CM

## 2011-03-14 MED ORDER — DICLOFENAC SODIUM 1 % TD GEL: 1.0000 "application " | Freq: Four times a day (QID) | TRANSDERMAL | Status: DC

## 2011-03-14 MED ORDER — IOHEXOL 300 MG/ML  SOLN: 100.0000 mL | Freq: Once | INTRAMUSCULAR | Status: AC | PRN

## 2011-03-14 MED ORDER — CLONAZEPAM 2 MG PO TABS: ORAL_TABLET | ORAL | Status: DC

## 2011-03-14 NOTE — Patient Instructions (Signed)
You have been scheduled for an Endoscopy with separate instructions given. Your prescription(s) has(have) been sent to your pharmacy for you to pick up (Volteran gel). You have been given a rx for Klonopin to take to the pharmacy.

## 2011-03-14 NOTE — Progress Notes (Signed)
  Subjective:    Patient ID: [name], female    DOB: 09-03-1950, 60 y.o.   MRN: [id number]  HPI She is complaining of persistent pain in left abdomen, especially at prior jejunostomy feeding tube site. Pain medication is not "touching it". Cannot sleep due to the pain. Has been treated for diverticulitis twice (empirically) by me and Dr. Donell Beers without success. She had unrevealing CT today except for ? Bezoar in intestine. Quite animated in her descriptions and very talkative. No constipation or diarrhea, no nausea or vomiting, no fever. She has lost 6# she says. Review of Systems Recent hospitalization early this month for confusion and dehydraition and cessation of alcohol use. She did not bring this up, found via chart review. " I just stopped, no problem". Says still not drinking. Unable to go to school (GED) due to the pain.    Objective:   Physical Exam Anxious and talkative middle-age white woman, thin and looking underweight. Eyes: are anicteric Abd: Exquisitely tender in left upper mid and lower abdomen and worse with muscle tension - at gastrostomy site  Ext: no edema       Assessment & Plan:

## 2011-03-15 ENCOUNTER — Encounter: Payer: Self-pay | Admitting: Internal Medicine

## 2011-03-15 NOTE — Assessment & Plan Note (Signed)
She had been in remission but in past year or longer has been drinking. She did not deny relapse and admission for cessation but did not bring it up. Husband did not, either. He wrote me a letter asking for help with her abdominal pain and did not bring it up. I think this is a major factor, combined with underlying anxiety issues. ? If she remains abstinent.

## 2011-03-15 NOTE — Assessment & Plan Note (Addendum)
Recurrent and persistent. Has not responded to empiric antiobitics, ? Diverticulitis. Hyperesthetic exam suggesting abdominal wall pain. ? If she could have occult diverticulitis given severe diverticulosis and angulaton, ? Adhesions. CT did not tell us cause. ? If neuropathic Start with EGD, may need flex sig/colonoscopy (propofol may be needed). Using some analgesic not on her list ? Percocet, did not help. Trying voltaren gel.

## 2011-03-15 NOTE — Assessment & Plan Note (Addendum)
Recurrent and persistent. Has not responded to empiric antiobitics, ? Diverticulitis. Hyperesthetic exam suggesting abdominal wall pain. ? If she could have occult diverticulitis given severe diverticulosis and angulaton, ? Adhesions. CT did not tell us cause. ? If neuropathic Start with EGD, may need flex sig/colonoscopy (propofol may be needed). Using some analgesic not on her list ? Percocet, did not help. Trying voltaren gel. 

## 2011-03-15 NOTE — Assessment & Plan Note (Signed)
Clonazepam started Symptom of underlying anxiety and ? Other mental illness

## 2011-03-15 NOTE — Assessment & Plan Note (Signed)
Clearly a problem and is not treated. Start clonazepam for sleep. Is going to need more help than that and will discuss again pending EGD.

## 2011-03-15 NOTE — Assessment & Plan Note (Signed)
She claims to have had in genital area - resolved and abdominal pain predates (goes back to 2010 at least). Do not think post-herpetic neuralgia

## 2011-03-17 ENCOUNTER — Ambulatory Visit (AMBULATORY_SURGERY_CENTER): Payer: BC Managed Care – PPO | Admitting: Internal Medicine

## 2011-03-17 ENCOUNTER — Encounter: Payer: Self-pay | Admitting: Internal Medicine

## 2011-03-17 VITALS — BP 121/69 | HR 62 | Temp 97.5°F | Resp 18 | Ht 62.0 in | Wt 89.0 lb

## 2011-03-17 DIAGNOSIS — K287 Chronic gastrojejunal ulcer without hemorrhage or perforation: Secondary | ICD-10-CM

## 2011-03-17 DIAGNOSIS — R109 Unspecified abdominal pain: Secondary | ICD-10-CM

## 2011-03-17 MED ORDER — SODIUM CHLORIDE 0.9 % IV SOLN: 500.0000 mL | INTRAVENOUS | Status: DC

## 2011-03-17 MED ORDER — OMEPRAZOLE 40 MG PO CPDR: 40.0000 mg | DELAYED_RELEASE_CAPSULE | Freq: Every day | ORAL | Status: DC

## 2011-03-17 NOTE — Patient Instructions (Addendum)
There is an ulcer where the stomach and intestine were sewed together at surgery. If you are taking Goody's or other similar medications it could be coming from them and if you are taking these again you must stop. I think the Voltaren gel is ok to use for the time being - to see if it will help after a week or 2. I have taken biopsies of the ulcer and will let you know the results an plans. I have also prescribed omeprazole to help the ulcer heal - you should pick that up from your pharmacy. MiraLax is an over the counter medication so you should be able to purchase that directly. You need to see your primary care MD or a psychiatrist about your anxiety issues that are part of the reason you do not feel well, in my opinion. I will also discuss your situation with Dr. Donell Beers. You may need an examination of the colon also. I will let you know. Iva Boop, MD, Clementeen Graham  No aspirin or NSAIDS until further notice per Dr [name] due to ulcers, ok to continue voltaren gel  short term Ulcer seen at the sight of your surgery, did biopsies. You should receive a letter from [place] in 1-2 weeks with the results and Dr Marvell Fuller recommendations for future visits/medicines, etc Start omeprazole 40mg  twice a day- sent to your pharmacy-you can pick this up today You will need to follow up with your psychiatrist or primary care doctor about your anxiety issues and alcohol use. Try ensure 3-4 times a day

## 2011-03-18 ENCOUNTER — Telehealth: Payer: Self-pay | Admitting: *Deleted

## 2011-03-18 NOTE — Telephone Encounter (Signed)
Phone line does not work. "Local telephone outage."

## 2011-03-20 ENCOUNTER — Telehealth: Payer: Self-pay

## 2011-03-20 NOTE — Telephone Encounter (Signed)
Message copied by Annett Fabian on Thu Mar 20, 2011 11:19 AM ------      Message from: Stan Head E      Created: Wed Mar 19, 2011  9:49 PM      Regarding: pathology results       She does have a gastric ulcer      Looks like it could be NSAID's - goody's BC, ibuprofen, ASA      Ask her if she is using any? If so must stop      We can do a blood test to find out if we need to - let her know            I am not convinced this is causing all of her pain and problems so set up colonoscopy with propofol 9/4 there is one slot left - dx is iron deficiency anemia 280.9 and LLQ pain            No recall or letter needed from [facility]

## 2011-03-20 NOTE — Telephone Encounter (Signed)
Left message for patient to call back  

## 2011-03-25 NOTE — Telephone Encounter (Signed)
Left message for patient to call back  

## 2011-03-26 NOTE — Telephone Encounter (Signed)
Left message for patient to call back  

## 2011-03-27 NOTE — Telephone Encounter (Signed)
I have left another message for the patient and mailed her a letter asking her to call back to schedule colon and Dr Marvell Fuller recommendations about NSAID use.

## 2011-04-01 ENCOUNTER — Ambulatory Visit: Payer: BC Managed Care – PPO | Admitting: Internal Medicine

## 2011-04-08 ENCOUNTER — Ambulatory Visit (INDEPENDENT_AMBULATORY_CARE_PROVIDER_SITE_OTHER): Payer: BC Managed Care – PPO | Admitting: Internal Medicine

## 2011-04-08 ENCOUNTER — Encounter: Payer: Self-pay | Admitting: Internal Medicine

## 2011-04-08 DIAGNOSIS — F411 Generalized anxiety disorder: Secondary | ICD-10-CM

## 2011-04-08 DIAGNOSIS — F1020 Alcohol dependence, uncomplicated: Secondary | ICD-10-CM

## 2011-04-08 DIAGNOSIS — Z0000 Encounter for general adult medical examination without abnormal findings: Secondary | ICD-10-CM

## 2011-04-08 DIAGNOSIS — Z23 Encounter for immunization: Secondary | ICD-10-CM

## 2011-04-08 DIAGNOSIS — E538 Deficiency of other specified B group vitamins: Secondary | ICD-10-CM

## 2011-04-08 DIAGNOSIS — M810 Age-related osteoporosis without current pathological fracture: Secondary | ICD-10-CM

## 2011-04-08 DIAGNOSIS — I10 Essential (primary) hypertension: Secondary | ICD-10-CM

## 2011-04-08 MED ORDER — CYANOCOBALAMIN 1000 MCG/ML IJ SOLN
1000.0000 ug | Freq: Once | INTRAMUSCULAR | Status: AC
  Administered 2011-04-08: 1000 ug via INTRAMUSCULAR

## 2011-04-08 NOTE — Progress Notes (Signed)
Addended by: Duard Brady I on: 04/08/2011 01:41 PM   Modules accepted: Orders

## 2011-04-08 NOTE — Progress Notes (Signed)
  Subjective:    Patient ID: [name], female    DOB: [birth year], 61 y.o.   MRN: [id number]  HPI  60 year old patient who is in today for followup. She has been followed closely by GI due to abdominal pain. This has not resolved. She states her weight is increased from 80 pounds to 94 pounds today she feels quite well. Her diarrhea has resolved. She is eating well gaining weight. No nausea vomiting. She has a history of alcoholism and chronic anxiety she has done well on Klonopin. She feels well today and really has no concerns or complaints. She has treated hypertension on lisinopril. She remains on omeprazole.  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 She has had a recent upper panendoscopy and GI has continued omeprazole   Review of Systems  Constitutional: Negative.   HENT: Negative for hearing loss, congestion, sore throat, rhinorrhea, dental problem, sinus pressure and tinnitus.   Eyes: Negative for pain, discharge and visual disturbance.  Respiratory: Negative for cough and shortness of breath.   Cardiovascular: Negative for chest pain, palpitations and leg swelling.  Gastrointestinal: Negative for nausea, vomiting, abdominal pain, diarrhea, constipation, blood in stool and abdominal distention.  Genitourinary: Negative for dysuria, urgency, frequency, hematuria, flank pain, vaginal bleeding, vaginal discharge, difficulty urinating, vaginal pain and pelvic pain.  Musculoskeletal: Negative for joint swelling, arthralgias and gait problem.  Skin: Negative for rash.  Neurological: Negative for dizziness, syncope, speech difficulty, weakness, numbness and headaches.  Hematological: Negative for adenopathy.  Psychiatric/Behavioral: Negative for behavioral problems, dysphoric mood and agitation. The patient is nervous/anxious.        Objective:   Physical Exam  Constitutional: She is oriented to person, place,  and time. She appears well-developed.       In no acute distress blood pressure low normal;thin   HENT:  Head: Normocephalic.  Right Ear: External ear normal.  Left Ear: External ear normal.  Mouth/Throat: Oropharynx is clear and moist.  Eyes: Conjunctivae and EOM are normal. Pupils are equal, round, and reactive to light.  Neck: Normal range of motion. Neck supple. No thyromegaly present.  Cardiovascular: Normal rate, regular rhythm, normal heart sounds and intact distal pulses.   Pulmonary/Chest: Effort normal and breath sounds normal.  Abdominal: Soft. Bowel sounds are normal. She exhibits no distension and no mass. There is no tenderness. There is no rebound and no guarding.  Musculoskeletal: Normal range of motion.  Lymphadenopathy:    She has no cervical adenopathy.  Neurological: She is alert and oriented to person, place, and time.  Skin: Skin is warm and dry. No rash noted.  Psychiatric: She has a normal mood and affect. Her behavior is normal.          Assessment & Plan:    Anxiety disorder. Appears fairly stable. We'll continue, as a pound. We'll consider referral to behavioral health if deterioration in the future B12 deficiency. We'll give her monthly beast 12 injection today  Hypertension well controlled will continue a restricted salt diet and lisinopril.  Recheck 3 months Follow GI as scheduled

## 2011-04-08 NOTE — Patient Instructions (Signed)
Limit your sodium (Salt) intake    It is important that you exercise regularly, at least 20 minutes 3 to 4 times per week.  If you develop chest pain or shortness of breath seek  medical attention.  Please check your blood pressure on a regular basis.  If it is consistently greater than 150/90, please make an office appointment.  Return in 3 months for follow-up   

## 2011-04-12 ENCOUNTER — Other Ambulatory Visit: Payer: Self-pay | Admitting: Internal Medicine

## 2011-04-14 NOTE — Telephone Encounter (Signed)
Rx faxed to [facility]. No further refills until seen!

## 2011-04-14 NOTE — Telephone Encounter (Signed)
[name] will need an REV for further refills

## 2011-04-14 NOTE — Telephone Encounter (Signed)
Do you want to refill this for the patient (clonazepam)?

## 2011-04-29 LAB — CBC
HCT: 30.6 — ABNORMAL LOW
Hemoglobin: 10.4 — ABNORMAL LOW
MCHC: 33.9
RBC: 3.14 — ABNORMAL LOW
RBC: 3.55 — ABNORMAL LOW
RDW: 13.3
WBC: 9.9

## 2011-04-29 LAB — URINALYSIS, ROUTINE W REFLEX MICROSCOPIC
Bilirubin Urine: NEGATIVE
Glucose, UA: 250 — AB
Hgb urine dipstick: NEGATIVE
Protein, ur: NEGATIVE
Urobilinogen, UA: 0.2

## 2011-04-29 LAB — COMPREHENSIVE METABOLIC PANEL
ALT: 26
ALT: 51 — ABNORMAL HIGH
AST: 32
Albumin: 4
Alkaline Phosphatase: 80
Alkaline Phosphatase: 89
BUN: 13
BUN: 3 — ABNORMAL LOW
CO2: 29
Calcium: 8.3 — ABNORMAL LOW
Chloride: 96
GFR calc non Af Amer: >60
Glucose, Bld: 122 — ABNORMAL HIGH
Potassium: 3.7
Potassium: 4.2
Sodium: 133 — ABNORMAL LOW
Sodium: 133 — ABNORMAL LOW
Total Bilirubin: 0.6
Total Protein: 5.1 — ABNORMAL LOW
Total Protein: 6.3

## 2011-04-29 LAB — DIFFERENTIAL
Basophils Absolute: 0
Basophils Relative: 0
Basophils Relative: 1
Eosinophils Absolute: 0 — ABNORMAL LOW
Eosinophils Absolute: 0 — ABNORMAL LOW
Eosinophils Relative: 0
Monocytes Absolute: 0.6
Monocytes Relative: 6
Neutro Abs: 4.4
Neutro Abs: 8.1 — ABNORMAL HIGH
Neutrophils Relative %: 77

## 2011-04-29 LAB — LIPASE, BLOOD: Lipase: 14

## 2011-04-29 LAB — OCCULT BLOOD X 1 CARD TO LAB, STOOL: Fecal Occult Bld: NEGATIVE

## 2011-05-08 NOTE — Discharge Summary (Signed)
  NAMEARAINA, BUTRICK NO.:  [id number]  MEDICAL RECORD NO.:  [id number]  LOCATION:                                 FACILITY:  PHYSICIAN:  Zannie Cove, MD     DATE OF BIRTH:  Aug 21, 1950  DATE OF ADMISSION: DATE OF DISCHARGE:                              DISCHARGE SUMMARY   PRIMARY CARE PHYSICIAN:  Gordy Savers, MD  DISCHARGE DIAGNOSES: 1. Mild alcohol withdrawal. 2. Confusion and altered mental status secondary to ethyl alcohol     withdrawal. 3. Hyperkalemia. 4. Hyponatremia, resolved. 5. Alcohol abuse. 6. History of subdural hematoma. 7. History of peptic ulcer disease. 8. History of Roux-en-Y surgery. 9. Hypertension.  DISCHARGE MEDICATIONS: 1. Omeprazole 20 mg b.i.d. 2. Thiamin 100 mg daily. 3. Lisinopril 20 mg daily.  DIAGNOSTIC/INVESTIGATIONS: 1. Chest x-ray showed no active cardiopulmonary disease. 2. CT of the head showed no evidence of acute intracranial     abnormality, left frontal encephalomalacic changes from prior     contusion, atrophy, and small vessel ischemic changes.  HOSPITAL COURSE:  [name] is a 60 year old female with prior history of alcohol abuse who was brought to the emergency room by her husband due to worsening confusion and combativeness over the past 48 hours. Apparently, she drinks heavily and has been drinking a 6-pack of beer every other day and had been doing so over years but more so in the last few weeks and then subsequently had decided to quit and after that was agitated, combative, talking out of her head, not making sense and hence brought to the emergency department for further evaluation and management.  She was found to be in alcohol withdrawal.  Her alcohol level was less than 11.  Rest of her workup including ammonia, CT of head, infectious workup, etc., was negative.  She was treated with CIWA protocol, thiamine, multivitamins, and IV fluids.  Subsequently, her mentation improved in 24-48  hours and discharged home in stable condition.  She was counseled regarding alcohol abuse and was recommended to have Psych see her in the hospital, however, declined this and hence is recommended to see a psychiatrist in the outpatient setting.  DISCHARGE CONDITION:  Stable.  DISCHARGE FOLLOWUP:  Gordy Savers, MD, in 1 week.     Zannie Cove, MD     PJ/MEDQ  D:  05/05/2011  T:  05/05/2011  Job:  [id number]  Electronically Signed by Zannie Cove  on 05/08/2011 09:27:10 AM

## 2011-05-26 ENCOUNTER — Ambulatory Visit (INDEPENDENT_AMBULATORY_CARE_PROVIDER_SITE_OTHER): Payer: BC Managed Care – PPO | Admitting: Family Medicine

## 2011-05-26 ENCOUNTER — Encounter: Payer: Self-pay | Admitting: Family Medicine

## 2011-05-26 VITALS — BP 122/66 | HR 75 | Temp 98.6°F | Wt 94.0 lb

## 2011-05-26 DIAGNOSIS — F411 Generalized anxiety disorder: Secondary | ICD-10-CM

## 2011-05-26 DIAGNOSIS — F419 Anxiety disorder, unspecified: Secondary | ICD-10-CM

## 2011-05-26 DIAGNOSIS — G4700 Insomnia, unspecified: Secondary | ICD-10-CM

## 2011-05-26 MED ORDER — ALPRAZOLAM 0.5 MG PO TABS: 0.5000 mg | ORAL_TABLET | Freq: Three times a day (TID) | ORAL | Status: DC | PRN

## 2011-05-26 NOTE — Progress Notes (Signed)
  Subjective:    Patient ID: [name], female    DOB: 05-21-1951, 60 y.o.   MRN: [id number]  HPI Here asking for refills on xanax to help with sleep. She has run out of Xanax, and she has a very stressful week this week. 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    Review of Systems  Constitutional: Negative.   Neurological: Negative.   Psychiatric/Behavioral: Positive for sleep disturbance. The patient is nervous/anxious.        Objective:   Physical Exam  Constitutional: She is oriented to person, place, and time. She appears well-developed and well-nourished.  Neurological: She is alert and oriented to person, place, and time.  Psychiatric: Her behavior is normal. Thought content normal.       Good eye contact, very anxious           Assessment & Plan:  Given a short term refill. She will follow up with Dr. Amador Cunas

## 2011-05-27 ENCOUNTER — Telehealth: Payer: Self-pay | Admitting: Internal Medicine

## 2011-05-27 NOTE — Telephone Encounter (Signed)
Pt called again checking on status of her note.

## 2011-05-27 NOTE — Telephone Encounter (Signed)
Pt called and is needing a doctors note that says that she was in for ov on 05/26/11 Monday. Pt needs this for college. Pt is req to pick this note up today. Pls call when ready for pick up.

## 2011-05-27 NOTE — Telephone Encounter (Signed)
Note is ready for pick up and I spoke with pt. 

## 2011-05-28 ENCOUNTER — Ambulatory Visit (INDEPENDENT_AMBULATORY_CARE_PROVIDER_SITE_OTHER): Payer: BC Managed Care – PPO | Admitting: Internal Medicine

## 2011-05-28 DIAGNOSIS — E538 Deficiency of other specified B group vitamins: Secondary | ICD-10-CM

## 2011-05-28 MED ORDER — CYANOCOBALAMIN 1000 MCG/ML IJ SOLN
1000.0000 ug | Freq: Once | INTRAMUSCULAR | Status: AC
  Administered 2011-05-28: 1000 ug via INTRAMUSCULAR

## 2011-06-30 ENCOUNTER — Telehealth: Payer: Self-pay | Admitting: Internal Medicine

## 2011-06-30 NOTE — Telephone Encounter (Signed)
Pt called and said that she is having pain in her colon and may have have a ?ulcer. Pt is in a lot of pain.

## 2011-06-30 NOTE — Telephone Encounter (Signed)
Pt states she had a colonoscopy in August 2012 and was told she had a small ulcer.  Pt states the ulcer did not bother her until November.  Pt states she is in school and she is having exams right now.  Pt states she has a gi doctor but would like to speak with Dr. Maggie Schwalbe first.  Pt has an appt on 07/01/11 at 9:15.

## 2011-07-01 ENCOUNTER — Ambulatory Visit (INDEPENDENT_AMBULATORY_CARE_PROVIDER_SITE_OTHER): Payer: BC Managed Care – PPO | Admitting: Internal Medicine

## 2011-07-01 ENCOUNTER — Encounter: Payer: Self-pay | Admitting: Internal Medicine

## 2011-07-01 DIAGNOSIS — R1032 Left lower quadrant pain: Secondary | ICD-10-CM

## 2011-07-01 DIAGNOSIS — I10 Essential (primary) hypertension: Secondary | ICD-10-CM

## 2011-07-01 DIAGNOSIS — G4700 Insomnia, unspecified: Secondary | ICD-10-CM

## 2011-07-01 DIAGNOSIS — F411 Generalized anxiety disorder: Secondary | ICD-10-CM

## 2011-07-01 DIAGNOSIS — R1012 Left upper quadrant pain: Secondary | ICD-10-CM

## 2011-07-01 MED ORDER — CLONAZEPAM 2 MG PO TABS: 2.0000 mg | ORAL_TABLET | Freq: Every evening | ORAL | Status: DC | PRN

## 2011-07-01 MED ORDER — TRAMADOL HCL 50 MG PO TABS: 50.0000 mg | ORAL_TABLET | Freq: Four times a day (QID) | ORAL | Status: DC | PRN

## 2011-07-01 MED ORDER — ALPRAZOLAM 0.5 MG PO TABS: 0.5000 mg | ORAL_TABLET | Freq: Three times a day (TID) | ORAL | Status: DC | PRN

## 2011-07-01 NOTE — Patient Instructions (Signed)
Return in 3 months for follow-up  Call or return to clinic prn if these symptoms worsen or fail to improve as anticipated.  

## 2011-07-01 NOTE — Progress Notes (Signed)
  Subjective:    Patient ID: [name], female    DOB: March 16, 1951, 60 y.o.   MRN: [id number]  HPI a 60 year old patient who has a history of chronic left lower quadrant pain chronic anxiety and insomnia who presents today for followup. Her chief complaint is left lower quadrant pain there's been no nausea vomiting or change in her bowel movements. She states she had a bowel movement last night and again this morning. She is requesting additional medication to assist with sleep. She takes Klonopin during the day for anxiety and has been out of her alprazolam. She did have a colonoscopy earlier this year. She has treated hypertension and a history of osteoporosis and diverticulosis.      Review of Systems  Constitutional: Negative.   HENT: Negative for hearing loss, congestion, sore throat, rhinorrhea, dental problem, sinus pressure and tinnitus.   Eyes: Negative for pain, discharge and visual disturbance.  Respiratory: Negative for cough and shortness of breath.   Cardiovascular: Negative for chest pain, palpitations and leg swelling.  Gastrointestinal: Positive for abdominal pain. Negative for nausea, vomiting, diarrhea, constipation, blood in stool and abdominal distention.  Genitourinary: Negative for dysuria, urgency, frequency, hematuria, flank pain, vaginal bleeding, vaginal discharge, difficulty urinating, vaginal pain and pelvic pain.  Musculoskeletal: Negative for joint swelling, arthralgias and gait problem.  Skin: Negative for rash.  Neurological: Negative for dizziness, syncope, speech difficulty, weakness, numbness and headaches.  Hematological: Negative for adenopathy.  Psychiatric/Behavioral: Positive for sleep disturbance. Negative for behavioral problems, dysphoric mood and agitation. The patient is nervous/anxious.        Objective:   Physical Exam  Constitutional: She is oriented to person, place, and time. She appears well-developed and well-nourished. No distress.    HENT:  Head: Normocephalic.  Right Ear: External ear normal.  Left Ear: External ear normal.  Mouth/Throat: Oropharynx is clear and moist.  Eyes: Conjunctivae and EOM are normal. Pupils are equal, round, and reactive to light.  Neck: Normal range of motion. Neck supple. No thyromegaly present.  Cardiovascular: Normal rate, regular rhythm, normal heart sounds and intact distal pulses.   Pulmonary/Chest: Effort normal and breath sounds normal.  Abdominal: Soft. Bowel sounds are normal. She exhibits no mass. There is no tenderness.       Midline surgical scar Approximate 5 cm mass effect about a jejunostomy scar in the left lower quadrant. This was nontender nor easily reducible  Musculoskeletal: Normal range of motion.  Lymphadenopathy:    She has no cervical adenopathy.  Neurological: She is alert and oriented to person, place, and time.  Skin: Skin is warm and dry. No rash noted.  Psychiatric: She has a normal mood and affect. Her behavior is normal.          Assessment & Plan:  Chronic abdominal pain. Left lower quadrant mass effect. Patient has had a recent colonoscopy. Unclear whether this represents stool or possibly an abdominal wall hernia Hypertension stable Chronic insomnia. Medications refilled  Patient stays quite comfortable and there's been no nausea vomiting or change in her bowel habits we'll simply observe at the present time If pain continues or worsens we'll reassess and consider radiologic evaluation

## 2011-07-04 ENCOUNTER — Telehealth: Payer: Self-pay | Admitting: Internal Medicine

## 2011-07-04 MED ORDER — TRAMADOL HCL 50 MG PO TABS: 50.0000 mg | ORAL_TABLET | Freq: Four times a day (QID) | ORAL | Status: DC | PRN

## 2011-07-04 NOTE — Telephone Encounter (Signed)
Spoke with pt- coming inthis afternoon to see me. KIK

## 2011-07-04 NOTE — Progress Notes (Signed)
Addended by: Eleonore Chiquito F on: 07/04/2011 01:38 PM   Modules accepted: Orders

## 2011-07-04 NOTE — Telephone Encounter (Signed)
Pt is having abdominal pain and is requesting something be call in. Please contact

## 2011-07-04 NOTE — Telephone Encounter (Signed)
Please ask patient to come by for a quick recheck; no office visit ; just place in an examining room for a quick exam

## 2011-07-04 NOTE — Progress Notes (Signed)
  Subjective:    Patient ID: [name], female    DOB: [birth year], 60 y.o.   MRN: [id number]  HPI  patient called earlier today complaining of persistent abdominal pain. Apparently she has not been using tramadol and was unaware this was available at her drugstore. When seen 3 days ago she did have a mass effect about her jejunostomy scar in the left lower quadrant area. The discomfort continues. She continues to have normal bowel movements without nausea or vomiting. She has had a recent colonoscopy as well as a CT abdominal scan. Review of Systems     Objective:   Physical Exam  Constitutional: She appears well-developed and well-nourished. No distress.  Abdominal: Soft. Bowel sounds are normal. She exhibits mass. She exhibits no distension. There is no tenderness. There is no rebound and no guarding.       Patient has fullness in the mass effect about the jejunostomy scar involving the left lower contract. It is unclear whether this represented a hernia or possibly just scarring          Assessment & Plan:   Chronic left-sided abdominal pain. Left-sided abdominal mass effect about the jejunostomy site. Unclear whether this represents a hernia or more likely scar tissue about the jejunostomy site. She certainly has no obstructive symptoms. We'll set up for general surgical followup

## 2011-07-09 ENCOUNTER — Other Ambulatory Visit: Payer: Self-pay | Admitting: Internal Medicine

## 2011-07-09 NOTE — Telephone Encounter (Signed)
Pt can not see Dr Ezzard Standing until jan 11,2013. Pt need refill on tramadol 50mg . Pt was given 20 pills only dec 11. Pharm [facility]

## 2011-07-09 NOTE — Telephone Encounter (Signed)
#  90  RF 3 

## 2011-07-10 MED ORDER — TRAMADOL HCL 50 MG PO TABS: 50.0000 mg | ORAL_TABLET | Freq: Four times a day (QID) | ORAL | Status: DC | PRN

## 2011-07-21 ENCOUNTER — Telehealth: Payer: Self-pay | Admitting: *Deleted

## 2011-07-21 MED ORDER — HYDROCODONE-ACETAMINOPHEN 5-500 MG PO TABS: 1.0000 | ORAL_TABLET | Freq: Three times a day (TID) | ORAL | Status: AC | PRN

## 2011-07-21 NOTE — Telephone Encounter (Signed)
Vicodin #50 one every 6 hours as needed for pain  5/500

## 2011-07-21 NOTE — Telephone Encounter (Signed)
Still having some abd pain and would like something stronger called in to [facility] pt sees Dr Ezzard Standing on 08/01/11

## 2011-07-21 NOTE — Telephone Encounter (Signed)
rx called in, pt aware 

## 2011-08-01 ENCOUNTER — Ambulatory Visit (INDEPENDENT_AMBULATORY_CARE_PROVIDER_SITE_OTHER): Payer: BC Managed Care – PPO | Admitting: Surgery

## 2011-08-01 ENCOUNTER — Encounter (INDEPENDENT_AMBULATORY_CARE_PROVIDER_SITE_OTHER): Payer: Self-pay | Admitting: Surgery

## 2011-08-01 VITALS — BP 114/66 | HR 76 | Temp 97.3°F | Resp 16 | Ht 63.0 in | Wt 90.2 lb

## 2011-08-01 DIAGNOSIS — R1012 Left upper quadrant pain: Secondary | ICD-10-CM

## 2011-08-01 NOTE — Progress Notes (Addendum)
[facility]  Kirsten Easterly, MD,  FACS [address]  [address] [place], [place]    [address] Phone:  [phone] FAX:  [phone]   Re:   [name] DOB:   04/22/1951 MRN:   [id number]  ASSESSMENT AND PLAN: 1.  Left abdominal pain.  Well localized pain, but without evidence of inflammation or hernia.  CT scans from August 2012 are not helpful, but according to her, her acute pain began in December 2012.  The patient is clearly hurting and it is effecting her lifestyle.  I think the next step is to get another CT scan.  Depending on what this shows - further options are to get Dr. Gessner's opinion, treat with antibiotics, or do some type of exploratory laparoscopy.  I gave her Vicodin #40, with 1 refill.  I will talk to her after the CT scan.  [I spoke with Dr. Gessner.  He was concerned about her follow up and possible drug seeking behavior.  But her CT scan on 08/05/2011 shows what looks like a chronic SBO.  I looks like further testing would not change the need for abdominal exploration.  I will try to start laparoscopically.  Discussed with patient by phone.  I went over the indications and portential complications.  One potential complication is the this will not relieve her pain. I spelled out the planned procedure. DN  08/07/2011.] [From Dr. Gessner: "Make sure she gets CBC, ferritin, CMP, coags before you operate - has been anemic and also ? Of recurrent EtOH use."   DN  08/17/2011]  2.  Dr Wyatt had done a pyloroplasty and vagotomy 08/03/2004 for a perforated ulcer.  She represented with a more severe perforated ulcer just a few months later.  This required an exploration with duodenal exclusion, gastrojejunostomy, and feeding jejunostomy 10/20/2004 - Dr. D. Alberto Schoch.  3.  History of anxiety. 4.  History of MRSA - right axilla - 2006. 5.  Weight loss of about 8 pounds.  She only weighs 90 pounds.  She attributes this to her pain. [6.  MRSA nasal swab -  positive. 08/12/2011  DN]  HISTORY OF PRESENT ILLNESS: Chief Complaint  Patient presents with  . Other    est pt- knot from where the feeding tube was and extreme pain    [name] is a 61 y.o. (DOB: 08/31/1950)  white female who is a patient of KWIATKOWSKI,PETER FRANK, MD and comes to me today for left sided abdominal pain.   She saw Dr. Byerly on 03/04/2011.  Dr. 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  [name] told me the severe pain first started July 04, 2011, but clearly she has seen physicians for some non-specific abdominal pain for several months.  She says she has lost weight, from 98 to 90 pounds.  But she has no vomiting or change in bowel habits.  I last saw [name] in 2006.  At that time, I took care of her for a pyloric ulcer perforation which required an exploration with duodenal exclusion, gastrojejunostomy, and feeding jejunostomy (10/20/2004).  She had a CT scan of her abdomen 03/11/2011.  There is nothing on the CT scan in August to suggest what is causing the pain.  She does have a calcified nodule in the duodenum (It is not clear what this is and it appears unrelated to her current symptoms.)  Accompanied with husband.    She is at [employer] trying to get her GED.  But this pain has limited what she can do.  PHYSICAL EXAM: BP 114/66  Pulse 76  Temp(Src) 97.3 F (36.3 C) (Temporal)  Resp 16  Ht 5' 3" (1.6 m)  Wt 90 lb 3.2 oz (40.914 kg)  BMI 15.98 kg/m2  General: Very [name] who is alert and generally healthy appearing.  HEENT: Normal. Pupils equal. Good dentition. Neck: Supple. No mass.  No thyroid mass.  Carotid pulse okay with no bruit. Lymph Nodes:  No supraclavicular or cervical nodes. Lungs: Clear to auscultation and symmetric breath sounds. Heart:  RRR. No murmur or rub.  Abdomen: Soft. Thin.  Midline incision.  Approximate 3 to 4 cm firm area in left mid abdomen at prior jejunostomy  site.  This is the site of her abdominal pain.  But no obvious infection or hernia.  The patient is so thin, it's hard to think of her hiding something intra-abdominally. Extremities:  Good strength and ROM  in upper and lower extremities. Neurologic:  Grossly intact to motor and sensory function. Psychiatric: Has normal mood and affect. Behavior is normal.  DATA REVIEWED: Old abdominal CT scan and Dr. K's notes.   Delaina Fetsch, MD, FACS Office:  [phone]  

## 2011-08-01 NOTE — Patient Instructions (Signed)
Call us 3 days after the CT scan if you have not heard the results of the CT scan.

## 2011-08-04 ENCOUNTER — Other Ambulatory Visit: Payer: Self-pay | Admitting: Internal Medicine

## 2011-08-04 ENCOUNTER — Other Ambulatory Visit (INDEPENDENT_AMBULATORY_CARE_PROVIDER_SITE_OTHER): Payer: Self-pay

## 2011-08-04 DIAGNOSIS — D649 Anemia, unspecified: Secondary | ICD-10-CM

## 2011-08-04 DIAGNOSIS — R1012 Left upper quadrant pain: Secondary | ICD-10-CM

## 2011-08-05 ENCOUNTER — Ambulatory Visit
Admission: RE | Admit: 2011-08-05 | Discharge: 2011-08-05 | Disposition: A | Discharge status: Home / Self Care | Payer: BC Managed Care – PPO | Source: Ambulatory Visit | Attending: Surgery | Admitting: Surgery

## 2011-08-05 ENCOUNTER — Other Ambulatory Visit: Payer: Self-pay | Admitting: Internal Medicine

## 2011-08-05 MED ORDER — IOHEXOL 300 MG/ML  SOLN
100.0000 mL | Freq: Once | INTRAMUSCULAR | Status: AC | PRN
  Administered 2011-08-05: 100 mL via INTRAVENOUS

## 2011-08-07 ENCOUNTER — Other Ambulatory Visit (INDEPENDENT_AMBULATORY_CARE_PROVIDER_SITE_OTHER): Payer: Self-pay | Admitting: Surgery

## 2011-08-08 ENCOUNTER — Other Ambulatory Visit (INDEPENDENT_AMBULATORY_CARE_PROVIDER_SITE_OTHER): Payer: Self-pay | Admitting: Surgery

## 2011-08-11 ENCOUNTER — Other Ambulatory Visit (INDEPENDENT_AMBULATORY_CARE_PROVIDER_SITE_OTHER): Payer: Self-pay | Admitting: Surgery

## 2011-08-11 ENCOUNTER — Ambulatory Visit (INDEPENDENT_AMBULATORY_CARE_PROVIDER_SITE_OTHER): Payer: BC Managed Care – PPO | Admitting: Internal Medicine

## 2011-08-11 ENCOUNTER — Encounter (HOSPITAL_COMMUNITY): Payer: Self-pay | Admitting: Pharmacy Technician

## 2011-08-11 DIAGNOSIS — D519 Vitamin B12 deficiency anemia, unspecified: Secondary | ICD-10-CM

## 2011-08-11 DIAGNOSIS — N63 Unspecified lump in unspecified breast: Secondary | ICD-10-CM

## 2011-08-11 DIAGNOSIS — D518 Other vitamin B12 deficiency anemias: Secondary | ICD-10-CM

## 2011-08-11 MED ORDER — CYANOCOBALAMIN 1000 MCG/ML IJ SOLN
1000.0000 ug | Freq: Once | INTRAMUSCULAR | Status: AC
  Administered 2011-08-11: 1000 ug via INTRAMUSCULAR

## 2011-08-12 ENCOUNTER — Encounter (HOSPITAL_COMMUNITY): Payer: Self-pay

## 2011-08-12 ENCOUNTER — Encounter (HOSPITAL_COMMUNITY)
Admission: RE | Admit: 2011-08-12 | Discharge: 2011-08-12 | Disposition: A | Discharge status: Home / Self Care | Payer: BC Managed Care – PPO | Source: Ambulatory Visit | Attending: Surgery | Admitting: Surgery

## 2011-08-12 HISTORY — DX: Encounter for other specified aftercare: Z51.89

## 2011-08-12 HISTORY — DX: Acute upper respiratory infection, unspecified: J06.9

## 2011-08-12 HISTORY — DX: Reserved for inherently not codable concepts without codable children: IMO0001

## 2011-08-12 LAB — DIFFERENTIAL
Basophils Absolute: 0 10*3/uL (ref 0.0–0.1)
Lymphocytes Relative: 13 % (ref 12–46)
Lymphs Abs: 0.9 10*3/uL (ref 0.7–4.0)
Neutro Abs: 5.8 10*3/uL (ref 1.7–7.7)
Neutrophils Relative %: 79 % — ABNORMAL HIGH (ref 43–77)

## 2011-08-12 LAB — COMPREHENSIVE METABOLIC PANEL
CO2: 28 mEq/L (ref 19–32)
Calcium: 9.4 mg/dL (ref 8.4–10.5)
Creatinine, Ser: 0.52 mg/dL (ref 0.50–1.10)
GFR calc Af Amer: >90 mL/min (ref >90)
GFR calc non Af Amer: >90 mL/min (ref >90)
Glucose, Bld: 106 mg/dL — ABNORMAL HIGH (ref 70–99)
Sodium: 132 mEq/L — ABNORMAL LOW (ref 135–145)
Total Protein: 6.7 g/dL (ref 6.0–8.3)

## 2011-08-12 LAB — CBC
Hemoglobin: 12.5 g/dL (ref 12.0–15.0)
MCH: 33.7 pg (ref 26.0–34.0)
MCHC: 34 g/dL (ref 30.0–36.0)
MCV: 99.2 fL (ref 78.0–100.0)
Platelets: 494 10*3/uL — ABNORMAL HIGH (ref 150–400)
RBC: 3.71 MIL/uL — ABNORMAL LOW (ref 3.87–5.11)

## 2011-08-12 LAB — SURGICAL PCR SCREEN: MRSA, PCR: POSITIVE — AB

## 2011-08-12 NOTE — Patient Instructions (Signed)
[address] LOUELLEN HALDEMAN  08/12/2011   Your procedure is scheduled on:  08/14/11 1214pm-244pm   Report to [facility] at 1000 AM.  Call this number if you have problems the morning of surgery: [phone]   Remember:   Do not eat food:After Midnight.  May have clear liquids:until Midnight .  Clear liquids include soda, tea, black coffee, apple or grape juice, broth.  Take these medicines the morning of surgery with A SIP OF WATER:    Do not wear jewelry, make-up or nail polish.  Do not wear lotions, powders, or perfumes.   Do not shave 48 hours prior to surgery.  Do not bring valuables to the hospital.  Contacts, dentures or bridgework may not be worn into surgery.  Leave suitcase in the car. After surgery it may be brought to your room.  For patients admitted to the hospital, checkout time is 11:00 AM the day of discharge.      Special Instructions: CHG Shower Use Special Wash: 1/2 bottle night before surgery and 1/2 bottle morning of surgery. shower chin to toes with CHG.  Wash face and private parts with regular soap.    Please read over the following fact sheets that you were given: MRSA Information, coughing and deep breathing exercises, leg exercises

## 2011-08-12 NOTE — Progress Notes (Signed)
Quick Note:  These labs are OK for surgery. ______ 

## 2011-08-12 NOTE — Progress Notes (Signed)
Quick Note:  These labs are OK for surgery.  I also ordered a PT/INR/PTT/ferritin. What happened to these labs? Onalee Hua ______

## 2011-08-12 NOTE — Progress Notes (Signed)
Quick Note:  These labs are OK for surgery.  Great! ______

## 2011-08-13 ENCOUNTER — Telehealth (INDEPENDENT_AMBULATORY_CARE_PROVIDER_SITE_OTHER): Payer: Self-pay

## 2011-08-13 ENCOUNTER — Other Ambulatory Visit (INDEPENDENT_AMBULATORY_CARE_PROVIDER_SITE_OTHER): Payer: Self-pay

## 2011-08-13 LAB — PREALBUMIN: Prealbumin: 22.7 mg/dL (ref 17.0–34.0)

## 2011-08-13 NOTE — Pre-Procedure Instructions (Signed)
08/12/11 Huntley Dec at Dr Ezzard Standing office called and stated Dr Ezzard Standing wanted pt,ptt and Ferritin results ordered on preop visit.  I told Huntley Dec under signed and held orders there was no pt,ptt or ferritin ordered that I saw.  I relooked again and no order for pt,ptt or ferritin ordered.  Huntley Dec asked nurse to order PT,PTT, and Ferritin day of surgery.  Orders placed in computer.  I also explained to Huntley Dec that pt had stated yesterday on preop appt that office was supposed to be sending her some instructions ? In the mail that office called and stated they had sent last Thursday 08/07/11.  Pt told nurse at preop visit that she had not received on 08/11/11.  Pt also stated this am that she had still not received.  I instructed pt to call office this am and tell them she had still not received since surgery 08/14/11 Pt stated she had called them on pm of 08/12/11 and told office she still had not received.  Nurse asked Huntley Dec at office if pt needed a bowel preop.  Huntley Dec stated she would check into.  I also told Huntley Dec that pt had positive MRSA and Staph on PCR screening.  Nurse also informed Huntley Dec that pt had allergy to Sulfa and Penicillin and Mefoxin had been ordered for preop antibiotic.  Huntley Dec stated she would talk with Dr Ezzard Standing and call me if needed.

## 2011-08-13 NOTE — Pre-Procedure Instructions (Signed)
08/13/11 Huntley Dec at Dr Raelyn Mora office called back and stated that  Pt did not need bowel prep.  Huntley Dec had talked with pt.  Also, per Dr Raelyn Mora he will decide about antibiotic  On 08/14/11 per Huntley Dec at office.

## 2011-08-13 NOTE — Telephone Encounter (Signed)
I spoke to Dr Ezzard Standing about the pt.  He states she does not need a bowel prep for surgery.  I told him she is allergic to Sulfa and Penicillin but he ordered Cefoxitin.  He will address this at time of surgery to see what her reactions are.  The hospital did not have his orders for PT/INR/PTT and Ferritin so I gave them verbally to [facility] at [facility] presurgical testing.  I informed Albin Felling about the antibiotic.  I notified the pt she does not need a prep.  She states she did not receive a packet from [place] in scheduling and I checked with Eunice Blase and the pt will not need it.

## 2011-08-14 ENCOUNTER — Encounter (HOSPITAL_COMMUNITY)
Admission: RE | Disposition: A | Discharge status: Home / Self Care | Payer: Self-pay | Source: Ambulatory Visit | Attending: Surgery

## 2011-08-14 ENCOUNTER — Encounter (HOSPITAL_COMMUNITY): Payer: Self-pay | Admitting: *Deleted

## 2011-08-14 ENCOUNTER — Inpatient Hospital Stay (HOSPITAL_COMMUNITY)
Admission: RE | Admit: 2011-08-14 | Discharge: 2011-08-24 | DRG: 154 | Disposition: A | Discharge status: Home / Self Care | Payer: BC Managed Care – PPO | Source: Ambulatory Visit | Attending: Surgery | Admitting: Surgery

## 2011-08-14 ENCOUNTER — Encounter (HOSPITAL_COMMUNITY): Payer: Self-pay | Admitting: Anesthesiology

## 2011-08-14 ENCOUNTER — Other Ambulatory Visit (INDEPENDENT_AMBULATORY_CARE_PROVIDER_SITE_OTHER): Payer: Self-pay | Admitting: Surgery

## 2011-08-14 ENCOUNTER — Inpatient Hospital Stay (HOSPITAL_COMMUNITY): Payer: BC Managed Care – PPO | Admitting: Anesthesiology

## 2011-08-14 DIAGNOSIS — K3184 Gastroparesis: Secondary | ICD-10-CM | POA: Diagnosis present

## 2011-08-14 DIAGNOSIS — Z8711 Personal history of peptic ulcer disease: Secondary | ICD-10-CM

## 2011-08-14 DIAGNOSIS — F10939 Alcohol use, unspecified with withdrawal, unspecified: Secondary | ICD-10-CM | POA: Diagnosis present

## 2011-08-14 DIAGNOSIS — R110 Nausea: Secondary | ICD-10-CM | POA: Diagnosis not present

## 2011-08-14 DIAGNOSIS — R636 Underweight: Secondary | ICD-10-CM | POA: Diagnosis present

## 2011-08-14 DIAGNOSIS — T424X5A Adverse effect of benzodiazepines, initial encounter: Secondary | ICD-10-CM | POA: Diagnosis present

## 2011-08-14 DIAGNOSIS — K289 Gastrojejunal ulcer, unspecified as acute or chronic, without hemorrhage or perforation: Secondary | ICD-10-CM

## 2011-08-14 DIAGNOSIS — F19939 Other psychoactive substance use, unspecified with withdrawal, unspecified: Secondary | ICD-10-CM | POA: Diagnosis present

## 2011-08-14 DIAGNOSIS — Z22322 Carrier or suspected carrier of Methicillin resistant Staphylococcus aureus: Secondary | ICD-10-CM

## 2011-08-14 DIAGNOSIS — Z5331 Laparoscopic surgical procedure converted to open procedure: Secondary | ICD-10-CM

## 2011-08-14 DIAGNOSIS — K56609 Unspecified intestinal obstruction, unspecified as to partial versus complete obstruction: Secondary | ICD-10-CM

## 2011-08-14 DIAGNOSIS — I10 Essential (primary) hypertension: Secondary | ICD-10-CM | POA: Diagnosis present

## 2011-08-14 DIAGNOSIS — Z781 Physical restraint status: Secondary | ICD-10-CM | POA: Diagnosis present

## 2011-08-14 DIAGNOSIS — E871 Hypo-osmolality and hyponatremia: Secondary | ICD-10-CM | POA: Diagnosis not present

## 2011-08-14 DIAGNOSIS — K259 Gastric ulcer, unspecified as acute or chronic, without hemorrhage or perforation: Principal | ICD-10-CM | POA: Diagnosis present

## 2011-08-14 DIAGNOSIS — K660 Peritoneal adhesions (postprocedural) (postinfection): Secondary | ICD-10-CM | POA: Diagnosis present

## 2011-08-14 DIAGNOSIS — Z681 Body mass index (BMI) 19 or less, adult: Secondary | ICD-10-CM

## 2011-08-14 DIAGNOSIS — F1920 Other psychoactive substance dependence, uncomplicated: Secondary | ICD-10-CM | POA: Diagnosis present

## 2011-08-14 DIAGNOSIS — F1020 Alcohol dependence, uncomplicated: Secondary | ICD-10-CM | POA: Diagnosis present

## 2011-08-14 DIAGNOSIS — F10239 Alcohol dependence with withdrawal, unspecified: Secondary | ICD-10-CM | POA: Diagnosis present

## 2011-08-14 DIAGNOSIS — F411 Generalized anxiety disorder: Secondary | ICD-10-CM | POA: Diagnosis present

## 2011-08-14 HISTORY — PX: LAPAROSCOPY: SHX197

## 2011-08-14 HISTORY — PX: LAPAROTOMY: SHX154

## 2011-08-14 LAB — PROTIME-INR
INR: 0.95 (ref 0.00–1.49)
Prothrombin Time: 12.9 seconds (ref 11.6–15.2)

## 2011-08-14 LAB — APTT: aPTT: 31 seconds (ref 24–37)

## 2011-08-14 LAB — FERRITIN: Ferritin: 9 ng/mL — ABNORMAL LOW (ref 10–291)

## 2011-08-14 SURGERY — LAPAROTOMY, EXPLORATORY
Anesthesia: General | Site: Abdomen

## 2011-08-14 MED ORDER — PROMETHAZINE HCL 25 MG/ML IJ SOLN
6.2500 mg | INTRAMUSCULAR | Status: DC | PRN
  Administered 2011-08-14: 6.25 mg via INTRAVENOUS

## 2011-08-14 MED ORDER — FENTANYL CITRATE 0.05 MG/ML IJ SOLN
INTRAMUSCULAR | Status: DC | PRN
  Administered 2011-08-14 (×7): 50 ug via INTRAVENOUS

## 2011-08-14 MED ORDER — ACETAMINOPHEN 10 MG/ML IV SOLN
INTRAVENOUS | Status: DC | PRN
  Administered 2011-08-14: 1000 mg via INTRAVENOUS

## 2011-08-14 MED ORDER — BUPIVACAINE LIPOSOME 1.3 % IJ SUSP
20.0000 mL | Freq: Once | INTRAMUSCULAR | Status: DC
  Filled 2011-08-14: qty 20

## 2011-08-14 MED ORDER — ONDANSETRON HCL 4 MG/2ML IJ SOLN
4.0000 mg | Freq: Four times a day (QID) | INTRAMUSCULAR | Status: DC | PRN
  Administered 2011-08-15 – 2011-08-19 (×3): 4 mg via INTRAVENOUS
  Filled 2011-08-14 (×2): qty 2

## 2011-08-14 MED ORDER — CHLORHEXIDINE GLUCONATE 4 % EX LIQD: 1.0000 "application " | Freq: Once | CUTANEOUS | Status: DC

## 2011-08-14 MED ORDER — CEFOXITIN SODIUM-DEXTROSE 1-4 GM-% IV SOLR (PREMIX)
INTRAVENOUS | Status: AC
  Filled 2011-08-14: qty 50

## 2011-08-14 MED ORDER — BIOTENE DRY MOUTH MT LIQD
15.0000 mL | Freq: Two times a day (BID) | OROMUCOSAL | Status: DC
  Administered 2011-08-14 – 2011-08-23 (×12): 15 mL via OROMUCOSAL

## 2011-08-14 MED ORDER — LORAZEPAM 2 MG/ML IJ SOLN
1.0000 mg | Freq: Four times a day (QID) | INTRAMUSCULAR | Status: DC | PRN
  Administered 2011-08-17: 1 mg via INTRAVENOUS
  Filled 2011-08-14 (×2): qty 1

## 2011-08-14 MED ORDER — MIDAZOLAM HCL 5 MG/5ML IJ SOLN
INTRAMUSCULAR | Status: DC | PRN
  Administered 2011-08-14: 1 mg via INTRAVENOUS

## 2011-08-14 MED ORDER — ROCURONIUM BROMIDE 100 MG/10ML IV SOLN
INTRAVENOUS | Status: DC | PRN
  Administered 2011-08-14 (×3): 10 mg via INTRAVENOUS
  Administered 2011-08-14: 40 mg via INTRAVENOUS
  Administered 2011-08-14 (×4): 10 mg via INTRAVENOUS

## 2011-08-14 MED ORDER — ONDANSETRON HCL 4 MG PO TABS: 4.0000 mg | ORAL_TABLET | Freq: Four times a day (QID) | ORAL | Status: DC | PRN

## 2011-08-14 MED ORDER — CHLORHEXIDINE GLUCONATE 0.12 % MT SOLN
15.0000 mL | Freq: Two times a day (BID) | OROMUCOSAL | Status: DC
  Administered 2011-08-14 – 2011-08-24 (×17): 15 mL via OROMUCOSAL
  Filled 2011-08-14 (×21): qty 15

## 2011-08-14 MED ORDER — BUPIVACAINE LIPOSOME 1.3 % IJ SUSP
INTRAMUSCULAR | Status: DC | PRN
  Administered 2011-08-14: 20 mL

## 2011-08-14 MED ORDER — KCL IN DEXTROSE-NACL 20-5-0.45 MEQ/L-%-% IV SOLN
INTRAVENOUS | Status: AC
  Administered 2011-08-14 – 2011-08-15 (×2): via INTRAVENOUS
  Administered 2011-08-15: 125 mL via INTRAVENOUS
  Administered 2011-08-15: 14:00:00 via INTRAVENOUS
  Administered 2011-08-16: 125 mL/h via INTRAVENOUS
  Administered 2011-08-16: 06:00:00 via INTRAVENOUS
  Filled 2011-08-14 (×10): qty 1000

## 2011-08-14 MED ORDER — MEPERIDINE HCL 25 MG/ML IJ SOLN
INTRAMUSCULAR | Status: DC | PRN
  Administered 2011-08-14: 25 mg via INTRAVENOUS

## 2011-08-14 MED ORDER — DEXTROSE 5 % IV SOLN
1.0000 g | INTRAVENOUS | Status: AC
  Administered 2011-08-14: 1 g via INTRAVENOUS

## 2011-08-14 MED ORDER — LACTATED RINGERS IV SOLN
INTRAVENOUS | Status: DC
  Administered 2011-08-14: 15:00:00 via INTRAVENOUS
  Administered 2011-08-14: 1000 mL via INTRAVENOUS

## 2011-08-14 MED ORDER — ONDANSETRON HCL 4 MG/2ML IJ SOLN
4.0000 mg | Freq: Four times a day (QID) | INTRAMUSCULAR | Status: DC | PRN
  Administered 2011-08-16 (×2): 4 mg via INTRAVENOUS
  Filled 2011-08-14 (×3): qty 2

## 2011-08-14 MED ORDER — DIPHENHYDRAMINE HCL 50 MG/ML IJ SOLN: 12.5000 mg | Freq: Four times a day (QID) | INTRAMUSCULAR | Status: DC | PRN

## 2011-08-14 MED ORDER — PROPOFOL 10 MG/ML IV EMUL
INTRAVENOUS | Status: DC | PRN
  Administered 2011-08-14: 130 mg via INTRAVENOUS

## 2011-08-14 MED ORDER — FENTANYL CITRATE 0.05 MG/ML IJ SOLN
25.0000 ug | INTRAMUSCULAR | Status: DC | PRN
  Administered 2011-08-14 (×4): 25 ug via INTRAVENOUS

## 2011-08-14 MED ORDER — PANTOPRAZOLE SODIUM 40 MG IV SOLR
40.0000 mg | Freq: Two times a day (BID) | INTRAVENOUS | Status: DC
Start: 2011-08-14 — End: 2011-08-22
  Administered 2011-08-14 – 2011-08-21 (×16): 40 mg via INTRAVENOUS
  Filled 2011-08-14 (×20): qty 40

## 2011-08-14 MED ORDER — MIDAZOLAM HCL 2 MG/2ML IJ SOLN
INTRAMUSCULAR | Status: AC
  Filled 2011-08-14: qty 2

## 2011-08-14 MED ORDER — MIDAZOLAM HCL 2 MG/2ML IJ SOLN: 0.5000 mg | Freq: Once | INTRAMUSCULAR | Status: AC | PRN

## 2011-08-14 MED ORDER — DIPHENHYDRAMINE HCL 12.5 MG/5ML PO ELIX: 12.5000 mg | ORAL_SOLUTION | Freq: Four times a day (QID) | ORAL | Status: DC | PRN

## 2011-08-14 MED ORDER — LACTATED RINGERS IV SOLN: INTRAVENOUS | Status: DC

## 2011-08-14 MED ORDER — ONDANSETRON HCL 4 MG/2ML IJ SOLN
INTRAMUSCULAR | Status: DC | PRN
  Administered 2011-08-14: 4 mg via INTRAVENOUS

## 2011-08-14 MED ORDER — NALOXONE HCL 0.4 MG/ML IJ SOLN: 0.4000 mg | INTRAMUSCULAR | Status: DC | PRN

## 2011-08-14 MED ORDER — MIDAZOLAM HCL 2 MG/2ML IJ SOLN
1.0000 mg | INTRAMUSCULAR | Status: DC | PRN
  Administered 2011-08-14 (×3): 1 mg via INTRAVENOUS

## 2011-08-14 MED ORDER — FENTANYL CITRATE 0.05 MG/ML IJ SOLN
50.0000 ug | INTRAMUSCULAR | Status: DC | PRN
  Administered 2011-08-14 (×2): 50 ug via INTRAVENOUS

## 2011-08-14 MED ORDER — MORPHINE SULFATE (PF) 1 MG/ML IV SOLN
INTRAVENOUS | Status: DC
  Administered 2011-08-14: 3 mg via INTRAVENOUS
  Administered 2011-08-14 – 2011-08-15 (×2): via INTRAVENOUS
  Administered 2011-08-15: 3 mg via INTRAVENOUS
  Administered 2011-08-15: 17:00:00 via INTRAVENOUS
  Administered 2011-08-15: 4 mg via INTRAVENOUS
  Administered 2011-08-16: 12.92 mg via INTRAVENOUS
  Administered 2011-08-16: 10.5 mg via INTRAVENOUS
  Administered 2011-08-16 (×2): via INTRAVENOUS
  Administered 2011-08-16: 9 mg via INTRAVENOUS
  Administered 2011-08-16: 12.73 mg via INTRAVENOUS
  Administered 2011-08-16: 06:00:00 via INTRAVENOUS
  Administered 2011-08-16: 14.39 mg via INTRAVENOUS
  Administered 2011-08-17: 1.5 mg via INTRAVENOUS
  Administered 2011-08-18: 3 mg via INTRAVENOUS
  Administered 2011-08-18: 10.5 mg via INTRAVENOUS
  Administered 2011-08-18: 6 mg via INTRAVENOUS
  Administered 2011-08-18: 21:00:00 via INTRAVENOUS
  Administered 2011-08-19: 7.5 mg via INTRAVENOUS
  Administered 2011-08-19: 10.4 mg via INTRAVENOUS
  Administered 2011-08-19: 4.5 mg via INTRAVENOUS
  Administered 2011-08-19: 12.68 mg via INTRAVENOUS
  Administered 2011-08-19: 6.89 mg via INTRAVENOUS
  Administered 2011-08-19 – 2011-08-20 (×3): via INTRAVENOUS
  Administered 2011-08-20: 0.3 mg via INTRAVENOUS
  Administered 2011-08-20: 7.5 mg via INTRAVENOUS
  Administered 2011-08-20: 7 mg via INTRAVENOUS
  Administered 2011-08-20: 7.5 mg via INTRAVENOUS
  Administered 2011-08-20: 6.92 mg via INTRAVENOUS
  Administered 2011-08-20: 4.5 mg via INTRAVENOUS
  Administered 2011-08-21: 7.5 mg via INTRAVENOUS
  Administered 2011-08-21: 3 mg via INTRAVENOUS
  Administered 2011-08-21: 7.5 mg via INTRAVENOUS
  Administered 2011-08-21: 6 mg via INTRAVENOUS
  Administered 2011-08-21: 9 mg via INTRAVENOUS
  Administered 2011-08-21: 3 mg via INTRAVENOUS
  Administered 2011-08-22: 11.32 mg via INTRAVENOUS
  Administered 2011-08-22 (×2): 3 mg via INTRAVENOUS
  Filled 2011-08-14 (×15): qty 25

## 2011-08-14 MED ORDER — SODIUM CHLORIDE 0.9 % IR SOLN
Status: DC | PRN
  Administered 2011-08-14: 2000 mL

## 2011-08-14 MED ORDER — DEXTROSE 5 % IV SOLN
1.0000 g | Freq: Four times a day (QID) | INTRAVENOUS | Status: AC
  Administered 2011-08-14 – 2011-08-15 (×2): 1 g via INTRAVENOUS
  Filled 2011-08-14 (×2): qty 1

## 2011-08-14 MED ORDER — ACETAMINOPHEN 10 MG/ML IV SOLN
INTRAVENOUS | Status: AC
  Filled 2011-08-14: qty 100

## 2011-08-14 MED ORDER — NEOSTIGMINE METHYLSULFATE 1 MG/ML IJ SOLN
INTRAMUSCULAR | Status: DC | PRN
  Administered 2011-08-14: 4 mg via INTRAVENOUS

## 2011-08-14 MED ORDER — LIDOCAINE HCL (CARDIAC) 20 MG/ML IV SOLN
INTRAVENOUS | Status: DC | PRN
  Administered 2011-08-14: 50 mg via INTRAVENOUS

## 2011-08-14 MED ORDER — GLYCOPYRROLATE 0.2 MG/ML IJ SOLN
INTRAMUSCULAR | Status: DC | PRN
  Administered 2011-08-14: .5 mg via INTRAVENOUS

## 2011-08-14 MED ORDER — HEPARIN SODIUM (PORCINE) 5000 UNIT/ML IJ SOLN
5000.0000 [IU] | Freq: Three times a day (TID) | INTRAMUSCULAR | Status: DC
  Administered 2011-08-14 – 2011-08-24 (×29): 5000 [IU] via SUBCUTANEOUS
  Filled 2011-08-14 (×33): qty 1

## 2011-08-14 MED ORDER — FENTANYL CITRATE 0.05 MG/ML IJ SOLN
INTRAMUSCULAR | Status: AC
  Filled 2011-08-14: qty 2

## 2011-08-14 MED ORDER — SODIUM CHLORIDE 0.9 % IJ SOLN: 9.0000 mL | INTRAMUSCULAR | Status: DC | PRN

## 2011-08-14 SURGICAL SUPPLY — 56 items
APPLICATOR COTTON TIP 6IN STRL (MISCELLANEOUS) IMPLANT
BENZOIN TINCTURE PRP APPL 2/3 (GAUZE/BANDAGES/DRESSINGS) IMPLANT
BLADE EXTENDED COATED 6.5IN (ELECTRODE) ×3 IMPLANT
BLADE HEX COATED 2.75 (ELECTRODE) ×3 IMPLANT
CANISTER SUCTION 2500CC (MISCELLANEOUS) ×3 IMPLANT
CANNULA ENDOPATH XCEL 11M (ENDOMECHANICALS) IMPLANT
CLOTH BEACON ORANGE TIMEOUT ST (SAFETY) ×3 IMPLANT
COVER MAYO STAND STRL (DRAPES) IMPLANT
DECANTER SPIKE VIAL GLASS SM (MISCELLANEOUS) IMPLANT
DERMABOND ADVANCED (GAUZE/BANDAGES/DRESSINGS)
DERMABOND ADVANCED .7 DNX12 (GAUZE/BANDAGES/DRESSINGS) IMPLANT
DRAIN PENROSE 18X1/2 LTX STRL (DRAIN) ×3 IMPLANT
DRAPE LAPAROSCOPIC ABDOMINAL (DRAPES) ×3 IMPLANT
DRAPE WARM FLUID 44X44 (DRAPE) ×3 IMPLANT
ELECT REM PT RETURN 9FT ADLT (ELECTROSURGICAL) ×3
ELECTRODE REM PT RTRN 9FT ADLT (ELECTROSURGICAL) ×2 IMPLANT
GLOVE BIOGEL PI IND STRL 7.0 (GLOVE) ×2 IMPLANT
GLOVE BIOGEL PI INDICATOR 7.0 (GLOVE) ×1
GLOVE SURG SIGNA 7.5 PF LTX (GLOVE) ×3 IMPLANT
GOWN STRL NON-REIN LRG LVL3 (GOWN DISPOSABLE) ×3 IMPLANT
GOWN STRL REIN XL XLG (GOWN DISPOSABLE) ×6 IMPLANT
HAND ACTIVATED (MISCELLANEOUS) IMPLANT
KIT BASIN OR (CUSTOM PROCEDURE TRAY) ×3 IMPLANT
NS IRRIG 1000ML POUR BTL (IV SOLUTION) ×6 IMPLANT
PACK GENERAL/GYN (CUSTOM PROCEDURE TRAY) ×3 IMPLANT
RELOAD BLUE (STAPLE) ×3 IMPLANT
RELOAD GREEN (STAPLE) ×3 IMPLANT
RELOAD PROXIMATE 75MM BLUE (ENDOMECHANICALS) ×3 IMPLANT
SET IRRIG TUBING LAPAROSCOPIC (IRRIGATION / IRRIGATOR) IMPLANT
SOLUTION ANTI FOG 6CC (MISCELLANEOUS) ×3 IMPLANT
SPONGE GAUZE 4X4 12PLY (GAUZE/BANDAGES/DRESSINGS) ×3 IMPLANT
SPONGE LAP 18X18 X RAY DECT (DISPOSABLE) ×3 IMPLANT
STAPLER ECHELON FLEX (STAPLE) ×3 IMPLANT
STAPLER PROXIMATE 75MM BLUE (STAPLE) ×3 IMPLANT
STAPLER VISISTAT 35W (STAPLE) ×3 IMPLANT
STRIP CLOSURE SKIN 1/2X4 (GAUZE/BANDAGES/DRESSINGS) IMPLANT
SUCTION POOLE TIP (SUCTIONS) ×3 IMPLANT
SUT PDS AB 1 CTX 36 (SUTURE) IMPLANT
SUT SILK 2 0 (SUTURE)
SUT SILK 2 0 SH CR/8 (SUTURE) ×3 IMPLANT
SUT SILK 2-0 18XBRD TIE 12 (SUTURE) IMPLANT
SUT SILK 3 0 (SUTURE)
SUT SILK 3 0 SH CR/8 (SUTURE) IMPLANT
SUT SILK 3-0 18XBRD TIE 12 (SUTURE) IMPLANT
SUT VIC AB 4-0 PS2 27 (SUTURE) IMPLANT
SUT VICRYL 2 0 18  UND BR (SUTURE) ×2
SUT VICRYL 2 0 18 UND BR (SUTURE) ×4 IMPLANT
TOWEL OR 17X26 10 PK STRL BLUE (TOWEL DISPOSABLE) ×6 IMPLANT
TRAY FOLEY CATH 14FRSI W/METER (CATHETERS) IMPLANT
TRAY LAP CHOLE (CUSTOM PROCEDURE TRAY) ×3 IMPLANT
TROCAR BLADELESS OPT 5 75 (ENDOMECHANICALS) ×3 IMPLANT
TROCAR XCEL BLUNT TIP 100MML (ENDOMECHANICALS) ×3 IMPLANT
TROCAR XCEL NON-BLD 11X100MML (ENDOMECHANICALS) IMPLANT
TUBING INSUFFLATION 10FT LAP (TUBING) ×3 IMPLANT
WATER STERILE IRR 1500ML POUR (IV SOLUTION) ×3 IMPLANT
YANKAUER SUCT BULB TIP NO VENT (SUCTIONS) ×3 IMPLANT

## 2011-08-14 NOTE — Brief Op Note (Signed)
08/14/2011  5:07 PM  PATIENT:  [name], 61 y.o., female, MRN: [id number]  PREOP DIAGNOSIS:  Left sided abdominal pain, dilated proximal small bowel on CT scan  POSTOP DIAGNOSIS:   Ulcer at gastrojejunostomy, walled off at anterior abdominal wall.  Retained food in stomach (questionable gastroparesis vs. Obstruction at outflow) Upper abdominal adhesions.  PROCEDURE:   Procedure(s): EXPLORATORY LAPAROTOMY, Enterolysis of adhesions (60 minutes), revision of gastrojejunostomy from an ante-colic location to retrocolic gastrojejunostomy (and converted this to a roux en y jejunojejenostomy), upper endoscopy.  SURGEON:   [name], M.D.  ASSISTANT:   Royston Sinner and Fredonia Highland  ANESTHESIA:   general  Einar Pheasant, MD - Anesthesiologist Elisabeth Cara, CRNA - CRNA Delphia Grates, CRNA - CRNA  General  EBL:  200  ml  BLOOD ADMINISTERED: none  DRAINS: none   LOCAL MEDICATIONS USED:   20 cc experel  SPECIMEN:   gastrojejunostomy  COUNTS CORRECT:  YES  INDICATIONS FOR PROCEDURE:  [name] is a 61 y.o. (DOB: June 15, 1951) white female whose primary care physician is Rogelia Boga, MD, MD and comes for abdominal exploration for left sided abdominal pain.  Pre operative CT scan suggest obstruction at the former feeding jejunostomy.   The indications and risks of the surgery were explained to the patient.  The risks include, but are not limited to, infection, bleeding, nerve injury, bowel resection, and the surgery not controlling her pain.  Note dictated to:   [id number]   DN  08/14/2011

## 2011-08-14 NOTE — Anesthesia Preprocedure Evaluation (Addendum)
Anesthesia Evaluation  Patient identified by MRN, date of birth, ID band Patient awake    Reviewed: Allergy & Precautions, H&P , NPO status , Patient's Chart, lab work & pertinent test results  History of Anesthesia Complications Negative for: history of anesthetic complications  Airway Mallampati: II TM Distance: >3 FB Neck ROM: Full    Dental No notable dental hx. (+) Edentulous Upper and Edentulous Lower   Pulmonary neg pulmonary ROS, Recent URI ,  clear to auscultation  Pulmonary exam normal       Cardiovascular hypertension, Pt. on medications Regular Normal    Neuro/Psych  Headaches, PSYCHIATRIC DISORDERS Anxiety    GI/Hepatic PUD, (+)     substance abuse (Denies ETOH past 6 years)  alcohol use,   Endo/Other  Negative Endocrine ROS  Renal/GU negative Renal ROS  Genitourinary negative   Musculoskeletal negative musculoskeletal ROS (+)   Abdominal   Peds  Hematology negative hematology ROS (+)   Anesthesia Other Findings   Reproductive/Obstetrics negative OB ROS                           Anesthesia Physical Anesthesia Plan  ASA: II  Anesthesia Plan: General   Post-op Pain Management:    Induction: Intravenous  Airway Management Planned: Oral ETT  Additional Equipment:   Intra-op Plan:   Post-operative Plan: Extubation in OR  Informed Consent: I have reviewed the patients History and Physical, chart, labs and discussed the procedure including the risks, benefits and alternatives for the proposed anesthesia with the patient or authorized representative who has indicated his/her understanding and acceptance.   Dental advisory given  Plan Discussed with: CRNA  Anesthesia Plan Comments:         Anesthesia Quick Evaluation

## 2011-08-14 NOTE — Interval H&P Note (Signed)
History and Physical Interval Note:  08/14/2011 12:23 PM  [name]  has presented today for surgery, with the diagnosis of bowel obstruction.  The various methods of treatment have been discussed with the patient and family. The patient is MRSA nasal swab positive.  Her symptoms are pain, not obstructive.  But I think the CT scan shows enough abnormality of the small bowel and the patient's symptoms persistent enough that exploration is warranted.  After consideration of risks, benefits and other options for treatment, the patient has consented to  Procedure(s): EXPLORATORY LAPAROTOMY, LAPAROSCOPY DIAGNOSTIC as a surgical intervention.  The patients' history has been reviewed, patient examined, no change in status, stable for surgery.  I have reviewed the patients' chart and labs.  Questions were answered to the patient's satisfaction.     [name]

## 2011-08-14 NOTE — H&P (View-Only) (Signed)
[facility]  [name], MD,  FACS [address] [place]  [address] [place], [place]    [address] Phone:  [phone] FAX:  [phone]   Re:   [name] DOB:   Nov 28, 1950 MRN:   [id number]  ASSESSMENT AND PLAN: 1.  Left abdominal pain.  Well localized pain, but without evidence of inflammation or hernia.  CT scans from August 2012 are not helpful, but according to her, her acute pain began in December 2012.  The patient is clearly hurting and it is effecting her lifestyle.  I think the next step is to get another CT scan.  Depending on what this shows - further options are to get Dr. Marvell Fuller opinion, treat with antibiotics, or do some type of exploratory laparoscopy.  I gave her Vicodin #40, with 1 refill.  I will talk to her after the CT scan.  [I spoke with Dr. [name].  He was concerned about her follow up and possible drug seeking behavior.  But her CT scan on 08/05/2011 shows what looks like a chronic SBO.  I looks like further testing would not change the need for abdominal exploration.  I will try to start laparoscopically.  Discussed with patient by phone.  I went over the indications and portential complications.  One potential complication is the this will not relieve her pain. I spelled out the planned procedure. DN  08/07/2011.] [From Dr. [name]: "Make sure she gets CBC, ferritin, CMP, coags before you operate - has been anemic and also ? Of recurrent EtOH use."   DN  08/17/2011]  2.  Dr [name] had done a pyloroplasty and vagotomy 08/03/2004 for a perforated ulcer.  She represented with a more severe perforated ulcer just a few months later.  This required an exploration with duodenal exclusion, gastrojejunostomy, and feeding jejunostomy 10/20/2004 - Dr. Algis Downs. [name].  3.  History of anxiety. 4.  History of MRSA - right axilla - 2006. 5.  Weight loss of about 8 pounds.  She only weighs 90 pounds.  She attributes this to her pain. [6.  MRSA nasal swab -  positive. 08/12/2011  DN]  HISTORY OF PRESENT ILLNESS: Chief Complaint  Patient presents with  . Other    est pt- knot from where the feeding tube was and extreme pain    [name] is a 61 y.o. (DOB: [birth year])  white female who is a patient of Rogelia Boga, MD and comes to me today for left sided abdominal pain.   She saw Dr. Donell Beers on 03/04/2011.  Dr. Donell Beers did not feel a hernia and wondered whether this was related her an anatomic variant of her transverse colon.  [name] told me the severe pain first started July 04, 2011, but clearly she has seen physicians for some non-specific abdominal pain for several months.  She says she has lost weight, from 98 to 90 pounds.  But she has no vomiting or change in bowel habits.  I last saw [name] in 2006.  At that time, I took care of her for a pyloric ulcer perforation which required an exploration with duodenal exclusion, gastrojejunostomy, and feeding jejunostomy (10/20/2004).  She had a CT scan of her abdomen 03/11/2011.  There is nothing on the CT scan in August to suggest what is causing the pain.  She does have a calcified nodule in the duodenum (It is not clear what this is and it appears unrelated to her current symptoms.)  Accompanied with husband.  She is at [facility] trying to get her GED.  But this pain has limited what she can do.  PHYSICAL EXAM: BP 114/66  Pulse 76  Temp(Src) 97.3 F (36.3 C) (Temporal)  Resp 16  Ht 5\' 3"  (1.6 m)  Wt 90 lb 3.2 oz (40.914 kg)  BMI 15.98 kg/m2  General: Very [name] who is alert and generally healthy appearing.  HEENT: Normal. Pupils equal. Good dentition. Neck: Supple. No mass.  No thyroid mass.  Carotid pulse okay with no bruit. Lymph Nodes:  No supraclavicular or cervical nodes. Lungs: Clear to auscultation and symmetric breath sounds. Heart:  RRR. No murmur or rub.  Abdomen: Soft. Thin.  Midline incision.  Approximate 3 to 4 cm firm area in left mid abdomen at prior jejunostomy  site.  This is the site of her abdominal pain.  But no obvious infection or hernia.  The patient is so thin, it's hard to think of her hiding something intra-abdominally. Extremities:  Good strength and ROM  in upper and lower extremities. Neurologic:  Grossly intact to motor and sensory function. Psychiatric: Has normal mood and affect. Behavior is normal.  DATA REVIEWED: Old abdominal CT scan and Dr. Charm Rings notes.   [name], MD, FACS Office:  [phone]

## 2011-08-14 NOTE — Anesthesia Postprocedure Evaluation (Signed)
Anesthesia Post Note  Patient: [name]  Procedure(s) Performed:  EXPLORATORY LAPAROTOMY - exploratory laparotomy, lysis of adhesions, revision gastrojejunostomy, upper endoscopy; LAPAROSCOPY DIAGNOSTIC  Anesthesia type: General  Patient location: PACU  Post pain: Pain level controlled  Post assessment: Post-op Vital signs reviewed  Last Vitals:  Filed Vitals:   08/14/11 1900  BP: 124/62  Pulse: 65  Temp:   Resp: 10    Post vital signs: Reviewed  Level of consciousness: sedated  Complications: No apparent anesthesia complications

## 2011-08-14 NOTE — Preoperative (Signed)
Beta Blockers   Reason not to administer Beta Blockers:Not Applicable 

## 2011-08-14 NOTE — Transfer of Care (Signed)
Immediate Anesthesia Transfer of Care Note  Patient: [name]  Procedure(s) Performed:  EXPLORATORY LAPAROTOMY - exploratory laparotomy, lysis of adhesions, revision gastrojejunostomy, upper endoscopy; LAPAROSCOPY DIAGNOSTIC  Patient Location: PACU  Anesthesia Type: General  Level of Consciousness: awake, alert , oriented and patient cooperative  Airway & Oxygen Therapy: Patient Spontanous Breathing and Patient connected to face mask oxygen  Post-op Assessment: Report given to PACU RN and Post -op Vital signs reviewed and stable  Post vital signs: Reviewed and stable  Complications: No apparent anesthesia complications

## 2011-08-15 ENCOUNTER — Inpatient Hospital Stay (HOSPITAL_COMMUNITY): Payer: BC Managed Care – PPO

## 2011-08-15 LAB — BASIC METABOLIC PANEL
BUN: 6 mg/dL (ref 6–23)
CO2: 24 mEq/L (ref 19–32)
Chloride: 97 mEq/L (ref 96–112)
Creatinine, Ser: 0.45 mg/dL — ABNORMAL LOW (ref 0.50–1.10)
GFR calc Af Amer: >90 mL/min (ref >90)
Glucose, Bld: 177 mg/dL — ABNORMAL HIGH (ref 70–99)
Potassium: 4 mEq/L (ref 3.5–5.1)

## 2011-08-15 LAB — CBC
HCT: 34.2 % — ABNORMAL LOW (ref 36.0–46.0)
Hemoglobin: 11.6 g/dL — ABNORMAL LOW (ref 12.0–15.0)
MCHC: 33.9 g/dL (ref 30.0–36.0)
MCV: 100.3 fL — ABNORMAL HIGH (ref 78.0–100.0)

## 2011-08-15 MED ORDER — SODIUM CHLORIDE 0.9 % IJ SOLN
10.0000 mL | INTRAMUSCULAR | Status: DC | PRN
  Administered 2011-08-15 – 2011-08-22 (×7): 10 mL

## 2011-08-15 MED ORDER — PROMETHAZINE HCL 25 MG/ML IJ SOLN
12.5000 mg | Freq: Four times a day (QID) | INTRAMUSCULAR | Status: DC | PRN
  Administered 2011-08-15 – 2011-08-19 (×4): 12.5 mg via INTRAVENOUS
  Filled 2011-08-15 (×3): qty 1

## 2011-08-15 NOTE — Progress Notes (Signed)
General Surgery Note  LOS: 1 day  POD# 1  Assessment/Plan:  1.  EXPLORATORY LAPAROTOMY, revision of gastrojejunostomy for marginal ulcer - 08/14/2011. Patient having trouble with nausea. Plan:  Ambulate, continue NGT, place PICC for anticipated TPN.  2.  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  3.  History of anxiety.  4.  Low body weight.  5.  MRSA nasal swab positive.  6.  Nutrition - plan PICC placement for TPN  7.  DVT prophylaxis - SQ heparin  Subjective:  Nauseated, vomited at least once last night. Objective:   Filed Vitals:   08/15/11 0647  BP: 118/71  Pulse: 71  Temp: 98.2 F (36.8 C)  Resp: 18     Intake/Output from previous day:  01/24 0701 - 01/25 0700 In: 3340 [I.V.:3300; NG/GT:30; IV Piggyback:10] Out: 750 [Urine:650; Blood:100]  Intake/Output this shift:      Physical Exam:   General: [name] who is alert and oriented.    HEENT: Normal. Pupils equal. Good dentition. .   Lungs: Clear   Abdomen: Quiet, but flat.   Wound: Dressing intact.  Stained.   Neurologic:  Grossly intact to motor and sensory function.   Psychiatric: Has normal mood and affect. Behavior is normal   Lab Results:    Basename 08/15/11 0310 08/12/11 1420  WBC 10.5 7.4  HGB 11.6* 12.5  HCT 34.2* 36.8  PLT 359 494*    BMET   Basename 08/15/11 0310 08/12/11 1420  NA 130* 132*  K 4.0 4.3  CL 97 95*  CO2 24 28  GLUCOSE 177* 106*  BUN 6 8  CREATININE 0.45* 0.52  CALCIUM 8.2* 9.4    PT/INR   Basename 08/14/11 1025  LABPROT 12.9  INR 0.95    ABG  No results found for this basename: PHART:2,PCO2:2,PO2:2,HCO3:2 in the last 72 hours   Studies/Results:  No results found.   Anti-infectives:   Anti-infectives     Start     Dose/Rate Route Frequency Ordered Stop   08/14/11 2030   cefOXitin (MEFOXIN) 1 g in dextrose 5 % 50 mL IVPB        1 g 100 mL/hr over 30 Minutes Intravenous Every 6 hours 08/14/11 1949 08/15/11 0305   08/14/11 1000   cefOXitin  (MEFOXIN) 1 g in dextrose 5 % 50 mL IVPB        1 g 100 mL/hr over 30 Minutes Intravenous 60 min pre-op 08/14/11 0954 08/14/11 1354          [name], MD, FACS Pager: [phone],   [facility] Office: [phone] 08/15/2011

## 2011-08-15 NOTE — Op Note (Signed)
NAMEYASLIN, KIRTLEY NO.:  [id number]  MEDICAL RECORD NO.:  [id number]  LOCATION:  1522                         FACILITY:  [facility]  PHYSICIAN:  Sandria Bales. Ezzard Standing, M.D.  DATE OF BIRTH:  Mar 06, 1951  DATE OF PROCEDURE:  08/14/2011                              OPERATIVE REPORT  PREOPERATIVE DIAGNOSES:  Left-sided abdominal pain, dilated proximal small bowel on CT scan.  POSTOPERATIVE DIAGNOSES:  Ulcer at gastrojejunostomy, walled-off anteriorly against the anterior abdominal wall, retained food in stomach with probable gastroparesis vs gastric outlet obstruction, and upper abdominal adhesions.  PROCEDURE:  Exploratory laparotomy with enterolysis of adhesions for 60 minutes, revision of gastrojejunostomy to a retrocolic gastrojejunostomy (Roux-en-Y), and upper endoscopy.  SURGEON:  Sandria Bales. Ezzard Standing, MD  ASSISTANTS:  Royston Sinner, MD, and Mary Sella. Andrey Campanile, MD  ANESTHESIA:  General endotracheal.  ESTIMATED BLOOD LOSS:  200 cc.  DRAINS:  Left in was an NG tube.  COMPLICATIONS:  None.  INDICATION FOR PROCEDURE:  Ms. Olenick is a 61 year old white female who sees Dr. Eleonore Chiquito from a medical standpoint.  She has had worsening left-sided abdominal pain over the last several months.  I did a CT scan evaluation that showed dilated small bowel consistent with a partial bowel obstruction, though the patient really did not have symptoms of bowel obstruction, short of after prior feeding jejunostomy.  The patient had a history in 2006 of having a perforated ulcer, requiring vagotomy and oversew by Dr. Megan Mans.  She then re-presented with another ulcer just a few months later.  I had to do a duodenal exclusion, gastrojejunostomy, and feeding jejunostomy, this was in April 2006.  Although current information is unclear whether she has some inflammatory changes due to her bowel versus a chronic partial bowel obstruction.  I have reviewed things with Dr. [name], we felt that  the best option probably would be to go ahead with surgery to do an exploration to evaluate this area.  As we tried to do laparoscopically, we talked with the patient extensively that because of her prior abdominal surgery, it may be very hard to do laparoscopically.  I discussed the indications and potential complications of surgery.  Potential complications of surgery including bleeding, infection, bowel resection, but this may not cure her pain.  OPERATIVE NOTE:  This patient was placed in a supine position, given general endotracheal anesthetic, supervised by Dr. Lucille Passy, in room #1 at [facility].  She was given cefoxitin to initiate the procedure.  Her abdomen was prepped with ChloraPrep, a Foley catheter was placed.  Abdomen was sterilely draped.  Time-out was held and surgical checklist run.  I tried to make a Hasson cutdown into the abdominal cavity to look around laparoscopically.  I was not able to see much in the peritoneal cavity because of adhesions, so I stopped this and then did an open laparotomy.    I made about 15 cm midline incision.  I could get to the lower half of the belly very well and see her pelvic organs and lower half of her abdomen, and her small bowel was free.  I could see her terminal ileum, but as  I walked the small bowel back, she had an adhesive area of the left anterior abdominal wall at the area of the gastro-jejunostomy.  I could see the colon, which looked okay.  Her upper abdomen was very stuck and was very densely scarred, but in taking this down, when I got into an ulcer at the Gastrojejunostomy. I think what had happened was the gastrojejunostomy had ulcerated and been walled-off by the anterior abdominal wall.  It thought the only reasonable thing to do was to revise her gastrojejunostomy.  I spent an hour lysing these adhesions just to evaluate the stomach, anastomosis, and the prior jejunostomy.  I divided the jejunum  in 2 places using a 75 mm GIA stapler.  I used 2 loads of the 60 mm Ethicon Echelon blue loads for the stomach, and 1 load of the 60 mm Ethicon Echelon green load to resect about a 2 cm rim of stomach and the jejunum, and this was sent to Pathology.  I oversewed the stomach resection with interrupted 2-0 Vicryl sutures.  Before, She had an antecolic anastomosis, but it looked like the new gastrojejunostomy would lay better doing this retrocolic, and I could find a window through the transverse colon mesentery.  So, I brought the small bowel through this window, did a posterior running 2-0 Vicryl suture.  I made an Enterotomy, and using a 45 Endo-GIA stapler posteriorly to create anastomosis Between the stomach and jejunum.   I then closed the enterotomy with interrupted 2-0 Vicryl sutures and closed the revised gastrojejunostomy anteriorly with a running 2-0 Vicryl suture.  I also created a jejunojejunostomy with a 45 Endo Ethicon GIA stapler in a side-to-side anastomosis.  I closed that with interrupted 2-0 Vicryl sutures.  The anastomosis laid well.  I then pulled the transverse colon mesentery to the stomach side of the anastomosis, and closed this with 2-0 Vicryl sutures.  I then broke scrub and endoscoped the patient while Dr. Andrey Campanile clamped off both the Roux limb and the jejunal limb.  On endoscopy, the patient had a very large stomach, bigger than I expected.  She had retained material that looked like a vegetable bezoar, covered in black and vegetative material.  I then examined where our anastomosis was and it looked widely patent.  Dr. Andrey Campanile put this under a water test.  There was no leak, and the stomach looked viable.  I then aspirated as best I could and withdrew the tube.  I did put an NG tube in after the endoscopy.  I then irrigated the abdomen with 2 L of saline.  I covered the gastrojejunostomy with Tisseel and infiltrated this abdominal wall with 20 cc of Exparel mixed  with 10 cc of saline for a total of 30 cc.  I closed the abdominal wall with 2 running #1 PDS with interrupted #1 Novafil sutures.  Irrigated the subcutaneous tissue and closed that with staples.  The patient tolerated the procedure well, was transported to the recovery room in good condition.  Sponge and needle counts were correct at the end of the case.  Estimated blood loss was about 200 cc.  Hopefully this will control her pain and I think one question is if she has some gastroparesis from her prior vagotomy and her large gastric remnant, and the retained food that was found; or was this just due to an ulcer in the anastomosis.   Sandria Bales. Ezzard Standing, M.D., FACS   DHN/MEDQ  D:  08/14/2011  T:  08/15/2011  Job:  [id number]  cc:   Gordy Savers, MD [address] [place] [address]  Iva Boop, MD,FACG [facility] [address] [place], [place] [address]

## 2011-08-16 ENCOUNTER — Inpatient Hospital Stay (HOSPITAL_COMMUNITY): Payer: BC Managed Care – PPO

## 2011-08-16 DIAGNOSIS — E46 Unspecified protein-calorie malnutrition: Secondary | ICD-10-CM

## 2011-08-16 LAB — DIFFERENTIAL
Eosinophils Absolute: 0 10*3/uL (ref 0.0–0.7)
Lymphs Abs: 0.6 10*3/uL — ABNORMAL LOW (ref 0.7–4.0)
Neutrophils Relative %: 84 % — ABNORMAL HIGH (ref 43–77)

## 2011-08-16 LAB — CBC
MCH: 34 pg (ref 26.0–34.0)
Platelets: 267 10*3/uL (ref 150–400)
RBC: 2.82 MIL/uL — ABNORMAL LOW (ref 3.87–5.11)
WBC: 9.5 10*3/uL (ref 4.0–10.5)

## 2011-08-16 LAB — COMPREHENSIVE METABOLIC PANEL
ALT: 9 U/L (ref 0–35)
Albumin: 2.1 g/dL — ABNORMAL LOW (ref 3.5–5.2)
Alkaline Phosphatase: 62 U/L (ref 39–117)
Glucose, Bld: 132 mg/dL — ABNORMAL HIGH (ref 70–99)
Potassium: 4.3 mEq/L (ref 3.5–5.1)
Sodium: 125 mEq/L — ABNORMAL LOW (ref 135–145)
Total Protein: 5 g/dL — ABNORMAL LOW (ref 6.0–8.3)

## 2011-08-16 MED ORDER — KCL IN DEXTROSE-NACL 20-5-0.45 MEQ/L-%-% IV SOLN
INTRAVENOUS | Status: DC
  Administered 2011-08-17: 85 mL/h via INTRAVENOUS
  Administered 2011-08-17 – 2011-08-18 (×2): via INTRAVENOUS
  Filled 2011-08-16 (×5): qty 1000

## 2011-08-16 MED ORDER — CHLORHEXIDINE GLUCONATE CLOTH 2 % EX PADS
6.0000 | MEDICATED_PAD | Freq: Every day | CUTANEOUS | Status: AC
  Administered 2011-08-16 – 2011-08-20 (×4): 6 via TOPICAL

## 2011-08-16 MED ORDER — MUPIROCIN 2 % EX OINT
1.0000 "application " | TOPICAL_OINTMENT | Freq: Two times a day (BID) | CUTANEOUS | Status: AC
  Administered 2011-08-16 – 2011-08-20 (×9): 1 via NASAL

## 2011-08-16 MED ORDER — CLINIMIX E/DEXTROSE (5/15) 5 % IV SOLN
INTRAVENOUS | Status: DC
  Administered 2011-08-16: 17:00:00 via INTRAVENOUS
  Filled 2011-08-16: qty 1000

## 2011-08-16 NOTE — Progress Notes (Signed)
General Surgery Note  LOS: 2 days  POD# 2 GI - Dr. Sharia Reeve, PCP - Dr. Jeannetta Nap.  Assessment/Plan:  1.  EXPLORATORY LAPAROTOMY, revision of gastrojejunostomy for marginal ulcer - 08/14/2011. Patient still having trouble with nausea, though it seems a little better. Plan:  Ambulate, continue NGT  2.  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  3.  History of anxiety.  4.  Low body weight.  5.  MRSA nasal swab positive.  6.  Nutrition - PICC in place, to start TPN this PM  7.  DVT prophylaxis - SQ heparin  Subjective:  Still nauseated.  Otherwise looks okay. Objective:   Filed Vitals:   08/16/11 0754  BP:   Pulse:   Temp:   Resp: 18     Intake/Output from previous day:  01/25 0701 - 01/26 0700 In: 4278.8 [I.V.:4278.8] Out: 2450 [Urine:2250; Emesis/NG output:200]  Intake/Output this shift:      Physical Exam:   General: [name] who is alert and oriented.    HEENT: Normal. Pupils equal. Good dentition. .   Lungs: Clear   Abdomen: Quiet, but flat.   Wound: Clean.   Neurologic:  Grossly intact to motor and sensory function.   Psychiatric: Has normal mood and affect. Behavior is normal   Lab Results:     Basename 08/16/11 0507 08/15/11 0310  WBC 9.5 10.5  HGB 9.9* 11.6*  HCT 28.0* 34.2*  PLT 267 359    BMET    Basename 08/16/11 0507 08/15/11 0310  NA 125* 130*  K 4.3 4.0  CL 92* 97  CO2 29 24  GLUCOSE 132* 177*  BUN 4* 6  CREATININE 0.43* 0.45*  CALCIUM 8.1* 8.2*    PT/INR    Basename 08/14/11 1025  LABPROT 12.9  INR 0.95    ABG  No results found for this basename: PHART:2,PCO2:2,PO2:2,HCO3:2 in the last 72 hours   Studies/Results:  Chest Portable 1 View Post Insertion To Confirm Placement As Interpreted By Radiologist  08/15/2011  *RADIOLOGY REPORT*  Clinical Data: Central line placement  PORTABLE CHEST - 1 VIEW  Comparison: 02/22/2011  Findings: Right PICC line tip projects at the mid SVC level. The NG tube passes into the  stomach although the distal tip position is not included on the film. Interstitial markings are diffusely coarsened with chronic features.  There is some trace atelectasis at the left base. Cardiopericardial silhouette is at upper limits of normal for size. Imaged bony structures of the thorax are intact.  IMPRESSION: Right PICC line tip projects at the mid SVC level.  Original Report Authenticated By: ERIC A. MANSELL, M.D.     Anti-infectives:   Anti-infectives     Start     Dose/Rate Route Frequency Ordered Stop   08/14/11 2030   cefOXitin (MEFOXIN) 1 g in dextrose 5 % 50 mL IVPB        1 g 100 mL/hr over 30 Minutes Intravenous Every 6 hours 08/14/11 1949 08/15/11 0305   08/14/11 1000   cefOXitin (MEFOXIN) 1 g in dextrose 5 % 50 mL IVPB        1 g 100 mL/hr over 30 Minutes Intravenous 60 min pre-op 08/14/11 0954 08/14/11 1354          [name], MD, FACS Pager: [phone],   [facility] Office: [phone] 08/16/2011

## 2011-08-16 NOTE — Progress Notes (Signed)
PARENTERAL NUTRITION CONSULT NOTE - INITIAL  Pharmacy Consult for TNA Indication: bowel obstruction, revision of gastrojejunostomy for marginal ulcer    Allergies  Allergen Reactions  . Sulfonamide Derivatives Anaphylaxis and Rash  . Penicillins     REACTION: unspecified  . Hydromorphone Hcl Rash    Patient Measurements: Height: 5\' 3"  (160 cm) Weight: 91 lb (41.277 kg) IBW/kg (Calculated) : 52.4   Vital Signs: Temp: 97.9 F (36.6 C) (01/26 0535) Temp src: Oral (01/26 0535) BP: 138/71 mmHg (01/26 0535) Pulse Rate: 77  (01/26 0535) Intake/Output from previous day: 01/25 0701 - 01/26 0700 In: 4278.8 [I.V.:4278.8] Out: 2450 [Urine:2250; Emesis/NG output:200] Intake/Output from this shift:    Labs:  [facility] 08/16/11 0507 08/15/11 0310 08/14/11 1025  WBC 9.5 10.5 --  HGB 9.9* 11.6* --  HCT 28.0* 34.2* --  PLT 267 359 --  APTT -- -- 31  INR -- -- 0.95     Basename 08/16/11 0507 08/15/11 0310  NA 125* 130*  K 4.3 4.0  CL 92* 97  CO2 29 24  GLUCOSE 132* 177*  BUN 4* 6  CREATININE 0.43* 0.45*  LABCREA -- --  CREAT24HRUR -- --  CALCIUM 8.1* 8.2*  MG 1.8 --  PHOS 3.0 --  PROT 5.0* --  ALBUMIN 2.1* --  AST 18 --  ALT 9 --  ALKPHOS 62 --  BILITOT 0.4 --  BILIDIR -- --  IBILI -- --  PREALBUMIN -- --  TRIG -- --  CHOLHDL -- --  CHOL -- --   Estimated Creatinine Clearance: 49.4 ml/min (by C-G formula based on Cr of 0.43).   No results found for this basename: GLUCAP:3 in the last 72 hours  Medical History: Past Medical History  Diagnosis Date  . Alcoholism 02/02/2009  . ANXIETY 01/21/2007  . DIVERTICULOSIS 07/09/2007  . Duodenal ulcer hemorrhage perforated 2006  . HYPERTENSION 06/23/2007  . HYPONATREMIA 02/02/2009    better off diuretic  . INSOMNIA 05/31/2007  . OSTEOPOROSIS 01/21/2007  . Raynaud's syndrome 06/23/2007  . VITAMIN B12 DEFICIENCY 10/02/2009  . Zoster 2012    twice - left groin/vagina  . NSAID-induced duodenal ulcer   . Blood transfusion      hx of 2005   . Headache 05/31/2007    pt denies at 08/12/11 preop visit   . Iron deficiency anemia     pt denies at 08/12/11 visit   . Recurrent upper respiratory infection (URI)     pt reports slight cold using Nyquil prn at bedtime     Medications:  Scheduled:    . antiseptic oral rinse  15 mL Mouth Rinse q12n4p  . chlorhexidine  15 mL Mouth Rinse BID  . Chlorhexidine Gluconate Cloth  6 each Topical Q0600  . heparin  5,000 Units Subcutaneous Q8H  . morphine   Intravenous Q4H  . mupirocin ointment  1 application Nasal BID  . pantoprazole (PROTONIX) IV  40 mg Intravenous Q12H   Infusions:    . dextrose 5 % and 0.45 % NaCl with KCl 20 mEq/L 125 mL/hr at 08/16/11 0547    Current Nutrition:  NPO, ice chips D5 1/2 NS with 20KCL 125 ml/hr  Assessment: 61 yo female s/p exploratory laparotomy with revision of gastrojejunostomy for marginal ulcer - 08/14/2011.   Nutritional Goals:  RD recommendations pending;  For now, E5/15 at goal rate 16ml/hr + 44ml/hr lipids MWF = 66g/d protein, 1417 Kcal MWF, 937 Kcal other days, avg/day/week 1143.  Plan:   Begin Clinimix E5/15 53ml/hr  today  Decrease MIVF 49ml/hr to maintain 160ml/hr total  Due to national backorder situation, lipids, multivitamins and trace elements MWF only  CBGs q8h, begin SSI only if >150  TNA labs in am, and qMon and qThurs  Continue Protonix 40mg  IV q12h for GI prophylaxis  Loralee Pacas, PharmD, BCPS Pager: [phone] 08/16/2011,10:10 AM

## 2011-08-17 LAB — CBC
HCT: 27.6 % — ABNORMAL LOW (ref 36.0–46.0)
Hemoglobin: 9.6 g/dL — ABNORMAL LOW (ref 12.0–15.0)
MCH: 34 pg (ref 26.0–34.0)
MCHC: 34.8 g/dL (ref 30.0–36.0)
MCV: 97.9 fL (ref 78.0–100.0)
Platelets: 252 10*3/uL (ref 150–400)
RBC: 2.82 MIL/uL — ABNORMAL LOW (ref 3.87–5.11)
RDW: 12 % (ref 11.5–15.5)
WBC: 9.1 10*3/uL (ref 4.0–10.5)

## 2011-08-17 LAB — PHOSPHORUS: Phosphorus: 2.3 mg/dL (ref 2.3–4.6)

## 2011-08-17 LAB — COMPREHENSIVE METABOLIC PANEL
BUN: 3 mg/dL — ABNORMAL LOW (ref 6–23)
CO2: 31 mEq/L (ref 19–32)
Chloride: 89 mEq/L — ABNORMAL LOW (ref 96–112)
Creatinine, Ser: 0.37 mg/dL — ABNORMAL LOW (ref 0.50–1.10)
GFR calc non Af Amer: >90 mL/min (ref >90)
Total Bilirubin: 0.4 mg/dL (ref 0.3–1.2)

## 2011-08-17 LAB — DIFFERENTIAL
Lymphocytes Relative: 6 % — ABNORMAL LOW (ref 12–46)
Lymphs Abs: 0.6 10*3/uL — ABNORMAL LOW (ref 0.7–4.0)
Monocytes Absolute: 0.9 10*3/uL (ref 0.1–1.0)
Monocytes Relative: 9 % (ref 3–12)
Neutro Abs: 7.7 10*3/uL (ref 1.7–7.7)

## 2011-08-17 LAB — GLUCOSE, CAPILLARY: Glucose-Capillary: 137 mg/dL — ABNORMAL HIGH (ref 70–99)

## 2011-08-17 LAB — PREALBUMIN: Prealbumin: 10.5 mg/dL — ABNORMAL LOW (ref 17.0–34.0)

## 2011-08-17 LAB — TRIGLYCERIDES: Triglycerides: 65 mg/dL (ref <150)

## 2011-08-17 LAB — CHOLESTEROL, TOTAL: Cholesterol: 158 mg/dL (ref 0–200)

## 2011-08-17 LAB — MAGNESIUM: Magnesium: 1.7 mg/dL (ref 1.5–2.5)

## 2011-08-17 MED ORDER — LORAZEPAM 2 MG/ML IJ SOLN
1.0000 mg | Freq: Once | INTRAMUSCULAR | Status: AC
  Administered 2011-08-17: 1 mg via INTRAVENOUS

## 2011-08-17 MED ORDER — LORAZEPAM 2 MG/ML IJ SOLN
1.0000 mg | INTRAMUSCULAR | Status: DC | PRN
  Administered 2011-08-17 – 2011-08-20 (×7): 1 mg via INTRAVENOUS
  Filled 2011-08-17 (×7): qty 1

## 2011-08-17 MED ORDER — HALOPERIDOL LACTATE 5 MG/ML IJ SOLN
4.0000 mg | INTRAMUSCULAR | Status: DC | PRN
Start: 2011-08-17 — End: 2011-08-24
  Administered 2011-08-17 (×3): 4 mg via INTRAMUSCULAR
  Filled 2011-08-17 (×2): qty 1

## 2011-08-17 MED ORDER — CLINIMIX E/DEXTROSE (5/15) 5 % IV SOLN
INTRAVENOUS | Status: AC
  Administered 2011-08-17: 18:00:00 via INTRAVENOUS
  Filled 2011-08-17: qty 1000

## 2011-08-17 MED ORDER — HALOPERIDOL LACTATE 5 MG/ML IJ SOLN
INTRAMUSCULAR | Status: AC
  Filled 2011-08-17: qty 1

## 2011-08-17 NOTE — Progress Notes (Signed)
General Surgery Note  LOS: 3 days  POD# 3 GI - Dr. Sharia Reeve, PCP - Dr. Jeannetta Nap.  Assessment/Plan:  1.  EXPLORATORY LAPAROTOMY, revision of gastrojejunostomy for marginal ulcer - 08/14/2011. Her problem has been withdrawal from xanax and EtOH. She is on ativan and haldol.  2.  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  3.  History of anxiety.  4.  Low body weight.  5.  MRSA nasal swab positive.  6.  Nutrition - PICC in place, on TPN.  Pt. Pulled on PICC, so this needs to be adjusted.  7.  DVT prophylaxis - SQ heparin  8.  Takes xanax and EtOH at home (Unknown amounts) - in withdrawal.  Subjective:  Confused and disoriented.  Husband at bed side. In restraints. Objective:   Filed Vitals:   08/17/11 0833  BP:   Pulse:   Temp:   Resp: 15     Intake/Output from previous day:  01/26 0701 - 01/27 0700 In: 2492.5 [I.V.:1921.8; NG/GT:30; TPN:540.7] Out: 3125 [Urine:2800; Emesis/NG output:325]  Intake/Output this shift:  Total I/O In: -  Out: 550 [Urine:550]   Physical Exam:   General: [name] who is alert and oriented.    HEENT: Normal. Pupils equal. Good dentition. .   Lungs: Clear   Abdomen: Quiet, but flat.   Wound: Clean.   Neurologic:  Grossly intact to motor and sensory function.   Psychiatric: Confused.  Recognizes me, but not location.  Lab Results:     [facility] 08/17/11 0620 08/16/11 0507  WBC 9.1 9.5  HGB 9.6* 9.9*  HCT 27.6* 28.0*  PLT 252 267    BMET    Basename 08/17/11 0620 08/16/11 0507  NA 124* 125*  K 3.7 4.3  CL 89* 92*  CO2 31 29  GLUCOSE 130* 132*  BUN 3* 4*  CREATININE 0.37* 0.43*  CALCIUM 8.3* 8.1*    PT/INR    Basename 08/14/11 1025  LABPROT 12.9  INR 0.95    ABG  No results found for this basename: PHART:2,PCO2:2,PO2:2,HCO3:2 in the last 72 hours   Studies/Results:  Dg Chest Port 1 View  08/16/2011  *RADIOLOGY REPORT*  Clinical Data: Evaluate PICC placement  PORTABLE CHEST - 1 VIEW  Comparison:  08/15/2011  Findings: Since the previous study, the right PICC catheter tip has withdrawn and is now located in the right upper mediastinum, likely at the junction of the subclavian vein and superior vena cava.  No pneumothorax.  Enteric tube tip is not visualized but is below the left hemidiaphragm.  Suggestion of atelectasis or infiltration in the left lung base behind the heart.  Blunting of the left costophrenic angle likely represent small effusion.  Vague nodular opacity over the left lung bases probably a prominent nipple shadow.  Emphysematous changes and scattered fibrosis in the lungs. Normal heart size and pulmonary vascularity.  Calcification of the aorta.  IMPRESSION: Right PICC catheter has withdrawn to the junction of the subclavian vein and SVC region.  Original Report Authenticated By: Marlon Pel, M.D.   Chest Portable 1 View Post Insertion To Confirm Placement As Interpreted By Radiologist  08/15/2011  *RADIOLOGY REPORT*  Clinical Data: Central line placement  PORTABLE CHEST - 1 VIEW  Comparison: 02/22/2011  Findings: Right PICC line tip projects at the mid SVC level. The NG tube passes into the stomach although the distal tip position is not included on the film. Interstitial markings are diffusely coarsened with chronic features.  There is  some trace atelectasis at the left base. Cardiopericardial silhouette is at upper limits of normal for size. Imaged bony structures of the thorax are intact.  IMPRESSION: Right PICC line tip projects at the mid SVC level.  Original Report Authenticated By: ERIC A. MANSELL, M.D.     Anti-infectives:   Anti-infectives     Start     Dose/Rate Route Frequency Ordered Stop   08/14/11 2030   cefOXitin (MEFOXIN) 1 g in dextrose 5 % 50 mL IVPB        1 g 100 mL/hr over 30 Minutes Intravenous Every 6 hours 08/14/11 1949 08/15/11 0305   08/14/11 1000   cefOXitin (MEFOXIN) 1 g in dextrose 5 % 50 mL IVPB        1 g 100 mL/hr over 30 Minutes  Intravenous 60 min pre-op 08/14/11 0954 08/14/11 1354          [name], MD, FACS Pager: [phone],   [facility] Office: [phone] 08/17/2011

## 2011-08-17 NOTE — Progress Notes (Signed)
Pt combative, yelling, hitting and kicking staff and husband.  Husband at bedside.  Patient pulling at tubes and trying to climb out of the bed.  Unable to assure.  Notified Dr Johna Sheriff.  Order given to give Ativan 1mg  x1 now.  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

## 2011-08-17 NOTE — Progress Notes (Signed)
PARENTERAL NUTRITION CONSULT NOTE - Follow Up  Pharmacy Consult for TNA Indication: bowel obstruction, revision of gastrojejunostomy for marginal ulcer    Allergies  Allergen Reactions  . Sulfonamide Derivatives Anaphylaxis and Rash  . Penicillins     REACTION: unspecified  . Hydromorphone Hcl Rash    Patient Measurements: Height: 5\' 3"  (160 cm) Weight: 91 lb (41.277 kg) IBW/kg (Calculated) : 52.4   Vital Signs: Temp: 98 F (36.7 C) (01/27 0530) Temp src: Oral (01/27 0530) BP: 164/94 mmHg (01/27 0530) Pulse Rate: 95  (01/27 0530) Intake/Output from previous day: 01/26 0701 - 01/27 0700 In: 2492.5 [I.V.:1921.8; NG/GT:30; TPN:540.7] Out: 3125 [Urine:2800; Emesis/NG output:325] Intake/Output from this shift: Total I/O In: -  Out: 550 [Urine:550]  Labs:  [facility] 08/17/11 0620 08/16/11 0507 08/15/11 0310 08/14/11 1025  WBC 9.1 9.5 10.5 --  HGB 9.6* 9.9* 11.6* --  HCT 27.6* 28.0* 34.2* --  PLT 252 267 359 --  APTT -- -- -- 31  INR -- -- -- 0.95     Basename 08/17/11 0620 08/16/11 0507 08/15/11 0310  NA 124* 125* 130*  K 3.7 4.3 4.0  CL 89* 92* 97  CO2 31 29 24   GLUCOSE 130* 132* 177*  BUN 3* 4* 6  CREATININE 0.37* 0.43* 0.45*  LABCREA -- -- --  CREAT24HRUR -- -- --  CALCIUM 8.3* 8.1* 8.2*  MG 1.7 1.8 --  PHOS 2.3 3.0 --  PROT 5.6* 5.0* --  ALBUMIN 2.4* 2.1* --  AST 21 18 --  ALT 11 9 --  ALKPHOS 67 62 --  BILITOT 0.4 0.4 --  BILIDIR -- -- --  IBILI -- -- --  PREALBUMIN -- -- --  TRIG -- -- --  CHOLHDL -- -- --  CHOL -- -- --   Estimated Creatinine Clearance: 49.4 ml/min (by C-G formula based on Cr of 0.37).   No results found for this basename: GLUCAP:3 in the last 72 hours  CBG: 177, 132, 130 (no SSI required)  Medications:  Scheduled:     . antiseptic oral rinse  15 mL Mouth Rinse q12n4p  . chlorhexidine  15 mL Mouth Rinse BID  . Chlorhexidine Gluconate Cloth  6 each Topical Q0600  . haloperidol lactate      . heparin  5,000 Units  Subcutaneous Q8H  . LORazepam  1 mg Intravenous Once  . morphine   Intravenous Q4H  . mupirocin ointment  1 application Nasal BID  . pantoprazole (PROTONIX) IV  40 mg Intravenous Q12H   Infusions:     . dextrose 5 % and 0.45 % NaCl with KCl 20 mEq/L 125 mL/hr (08/16/11 1346)  . dextrose 5 % and 0.45 % NaCl with KCl 20 mEq/L 85 mL/hr at 08/17/11 0034  . DISCONTD: TPN (CLINIMIX) +/- additives 40 mL/hr at 08/16/11 1729    Current Nutrition:  NPO, ice chips as of 1/24 D5 1/2 NS with 20KCL 85 ml/hr D10W 40ml/hr per protocol as TNA currently off  Assessment:  61 yo female s/p exploratory laparotomy with revision of gastrojejunostomy for marginal ulcer - 08/14/2011  TNA is currently off because patient has pulled PICC out of position.  IV team is to exchange PICC today and will resume TNA then   Nutritional Goals:  RD recommendations pending;  For now, E5/15 at goal rate 46ml/hr + 58ml/hr lipids MWF = 66g/d protein, 1417 Kcal MWF, 937 Kcal other days, avg/day/week 1143.  Plan:   If TNA can resume today - Clinimix E5/15 29ml/hr  Continue MIVF 17ml/hr to maintain 136ml/hr total  Due to national backorder situation, lipids, multivitamins and trace elements MWF only  CBGs q8h, begin SSI only if >150  TNA labs in am, and qMon and qThurs  Continue Protonix 40mg  IV q12h for GI prophylaxis  Suggest changing MIVF to D5NS 20KCl to assist with hyponatremia as amount in TNA cannot be adjusted due to available premixed formulations  Loralee Pacas, PharmD, BCPS Pager: [phone] 08/17/2011,8:42 AM

## 2011-08-17 NOTE — Progress Notes (Signed)
Patient very combative,yelling out, swinging at staff.  Patient with safety sitter at bedside.  Pt attempting to get out of bed and yelling, "i'm going home."  Paged Dr Johna Sheriff.  Order given for am labs and haldol. Called and spoke with husband concerning patients condition.  Husband verbalized that patient uses alcohol and xanax.  Husband verbalized concern that patient may be withdrawing from alcohol.  Husband will be over in the next few hours to stay with patient. Sitter at bedside. Haldol given without difficulty.  Pt resting in bed with call light within reach.

## 2011-08-17 NOTE — Progress Notes (Signed)
Pt found at 2230 with ng tube with taping undone and tubing halfway out of nose.   Patient assisted to an upright position and tubing advanced without difficulty.  Position checked via ausculation and placement verified.  Irrigated with 30cc normal saline.  Clear, brown drainage noted.  PICC line dressing lose and picc appears to be pulled out.  Bruising noted to upper arm posterior area and slight swelling noted.  Iv team called and dressing changed and PICC secured.  X-ray called to verify placement.  STAT portable x-ray ordered.  X-ray called.  Informed patient of the above.  2355- x-ray results noted and called IV team.  Iv team to call back with recommendation.  0002- Recommended to dc TPN.  Paged Dr Johna Sheriff.  Informed of the above situation.  Order given to stop TPN till tomorrow when exchange for PICC line can be given.  Informed IV team and patient.  Order given to maintain IV fluids and pain medicine.

## 2011-08-18 ENCOUNTER — Encounter (HOSPITAL_COMMUNITY): Payer: Self-pay | Admitting: Surgery

## 2011-08-18 LAB — COMPREHENSIVE METABOLIC PANEL
Alkaline Phosphatase: 70 U/L (ref 39–117)
BUN: 5 mg/dL — ABNORMAL LOW (ref 6–23)
CO2: 29 mEq/L (ref 19–32)
Chloride: 87 mEq/L — ABNORMAL LOW (ref 96–112)
GFR calc Af Amer: >90 mL/min (ref >90)
GFR calc non Af Amer: >90 mL/min (ref >90)
Glucose, Bld: 145 mg/dL — ABNORMAL HIGH (ref 70–99)
Potassium: 3.5 mEq/L (ref 3.5–5.1)
Total Bilirubin: 0.6 mg/dL (ref 0.3–1.2)
Total Protein: 5.6 g/dL — ABNORMAL LOW (ref 6.0–8.3)

## 2011-08-18 LAB — DIFFERENTIAL
Basophils Relative: 0 % (ref 0–1)
Eosinophils Absolute: 0.1 10*3/uL (ref 0.0–0.7)
Eosinophils Relative: 1 % (ref 0–5)
Neutrophils Relative %: 78 % — ABNORMAL HIGH (ref 43–77)

## 2011-08-18 LAB — CBC
MCH: 33.8 pg (ref 26.0–34.0)
MCHC: 35.5 g/dL (ref 30.0–36.0)
Platelets: 252 10*3/uL (ref 150–400)
RDW: 11.8 % (ref 11.5–15.5)

## 2011-08-18 LAB — TRIGLYCERIDES: Triglycerides: 43 mg/dL (ref <150)

## 2011-08-18 LAB — MAGNESIUM: Magnesium: 1.9 mg/dL (ref 1.5–2.5)

## 2011-08-18 LAB — CHOLESTEROL, TOTAL: Cholesterol: 162 mg/dL (ref 0–200)

## 2011-08-18 MED ORDER — TRACE MINERALS CR-CU-MN-SE-ZN 10-1000-500-60 MCG/ML IV SOLN
INTRAVENOUS | Status: AC
  Administered 2011-08-18: 18:00:00 via INTRAVENOUS
  Filled 2011-08-18: qty 1000

## 2011-08-18 MED ORDER — POTASSIUM CHLORIDE 2 MEQ/ML IV SOLN
INTRAVENOUS | Status: AC
  Administered 2011-08-18 (×2): via INTRAVENOUS
  Filled 2011-08-18 (×4): qty 1000

## 2011-08-18 MED ORDER — FAT EMULSION 20 % IV EMUL
240.0000 mL | INTRAVENOUS | Status: AC
  Administered 2011-08-18: 240 mL via INTRAVENOUS
  Filled 2011-08-18: qty 250

## 2011-08-18 MED FILL — Sodium Chloride Inj 0.9%: INTRAMUSCULAR | Qty: 10 | Status: AC

## 2011-08-18 MED FILL — Fibrin Sealant Component Kit 10 ML: CUTANEOUS | Qty: 1 | Status: AC

## 2011-08-18 NOTE — Progress Notes (Signed)
General Surgery Note  LOS: 4 days  POD# 4 GI - Dr. Sharia Reeve, PCP - Dr. Jeannetta Nap.  Assessment/Plan:  1.  EXPLORATORY LAPAROTOMY, revision of gastrojejunostomy for marginal ulcer - 08/14/2011. Her main post op problem has been withdrawal from xanax and EtOH.  She was agitated and confused last night. She is sleeping now. She is on ativan and haldol.  2.  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  3.  History of anxiety.  4.  Low body weight.  5.  MRSA nasal swab positive.  6.  Nutrition - on TPN.   To add Thiamine/Folic acid.  7.  DVT prophylaxis - SQ heparin  8.  Withdrawal symptoms -  takes xanax and EtOH at home (Unknown amounts)  9.  Na low.  Change IVF to normal Na.  Subjective:  Was confused and disoriented during the night.  Her  husband just left to go home. In restraints. Objective:   Filed Vitals:   08/18/11 0528  BP: 154/73  Pulse: 78  Temp: 97.3 F (36.3 C)  Resp: 20     Intake/Output from previous day:  01/27 0701 - 01/28 0700 In: 30 [NG/GT:30] Out: 4660 [Urine:4150; Emesis/NG output:510]  Intake/Output this shift:      Physical Exam:   General: [name] who is sleeping, but has been confused and disoriented.   HEENT: Normal. Has NGT in place .   Lungs: Clear   Abdomen: She does have BS.   Wound: Clean.   Neurologic:  Grossly intact to motor and sensory function.   Psychiatric: Confused.  Sleeping now.  Lab Results:    Basename 08/18/11 0640 08/17/11 0620  WBC 7.0 9.1  HGB 10.3* 9.6*  HCT 29.0* 27.6*  PLT 252 252    BMET    Basename 08/18/11 0640 08/17/11 0620  NA 123* 124*  K 3.5 3.7  CL 87* 89*  CO2 29 31  GLUCOSE 145* 130*  BUN 5* 3*  CREATININE 0.33* 0.37*  CALCIUM 8.6 8.3*    PT/INR   No results found for this basename: LABPROT:2,INR:2 in the last 72 hours  ABG  No results found for this basename: PHART:2,PCO2:2,PO2:2,HCO3:2 in the last 72 hours   Studies/Results:  Dg Chest Port 1 View  08/16/2011   *RADIOLOGY REPORT*  Clinical Data: Evaluate PICC placement  PORTABLE CHEST - 1 VIEW  Comparison: 08/15/2011  Findings: Since the previous study, the right PICC catheter tip has withdrawn and is now located in the right upper mediastinum, likely at the junction of the subclavian vein and superior vena cava.  No pneumothorax.  Enteric tube tip is not visualized but is below the left hemidiaphragm.  Suggestion of atelectasis or infiltration in the left lung base behind the heart.  Blunting of the left costophrenic angle likely represent small effusion.  Vague nodular opacity over the left lung bases probably a prominent nipple shadow.  Emphysematous changes and scattered fibrosis in the lungs. Normal heart size and pulmonary vascularity.  Calcification of the aorta.  IMPRESSION: Right PICC catheter has withdrawn to the junction of the subclavian vein and SVC region.  Original Report Authenticated By: Marlon Pel, M.D.     Anti-infectives:   Anti-infectives     Start     Dose/Rate Route Frequency Ordered Stop   08/14/11 2030   cefOXitin (MEFOXIN) 1 g in dextrose 5 % 50 mL IVPB        1 g 100 mL/hr over 30 Minutes Intravenous  Every 6 hours 08/14/11 1949 08/15/11 0305   08/14/11 1000   cefOXitin (MEFOXIN) 1 g in dextrose 5 % 50 mL IVPB        1 g 100 mL/hr over 30 Minutes Intravenous 60 min pre-op 08/14/11 0954 08/14/11 1354          [name], MD, FACS Pager: [phone],   [facility] [place] [facility] Office: [phone] 08/18/2011

## 2011-08-18 NOTE — Progress Notes (Signed)
INITIAL ADULT NUTRITION ASSESSMENT Date: 08/18/2011   Time: 3:40 PM Reason for Assessment: Consult, Nutrition risk   ASSESSMENT: Female 61 y.o.  Dx: Admitted for exploratory laparotomy with enterolysis of adhesions, revision of gastrojejunostomy to a retrocolic gastrojejunostomy (Roux-en-Y), and upper endoscopy.  Hx:  Past Medical History  Diagnosis Date  . Alcoholism 02/02/2009  . ANXIETY 01/21/2007  . DIVERTICULOSIS 07/09/2007  . Duodenal ulcer hemorrhage perforated 2006  . HYPERTENSION 06/23/2007  . HYPONATREMIA 02/02/2009    better off diuretic  . INSOMNIA 05/31/2007  . OSTEOPOROSIS 01/21/2007  . Raynaud's syndrome 06/23/2007  . VITAMIN B12 DEFICIENCY 10/02/2009  . Zoster 2012    twice - left groin/vagina  . NSAID-induced duodenal ulcer   . Blood transfusion     hx of 2005   . Headache 05/31/2007    pt denies at 08/12/11 preop visit   . Iron deficiency anemia     pt denies at 08/12/11 visit   . Recurrent upper respiratory infection (URI)     pt reports slight cold using Nyquil prn at bedtime    Related Meds: Scheduled Meds:   . antiseptic oral rinse  15 mL Mouth Rinse q12n4p  . chlorhexidine  15 mL Mouth Rinse BID  . Chlorhexidine Gluconate Cloth  6 each Topical Q0600  . haloperidol lactate      . heparin  5,000 Units Subcutaneous Q8H  . morphine   Intravenous Q4H  . mupirocin ointment  1 application Nasal BID  . pantoprazole (PROTONIX) IV  40 mg Intravenous Q12H   Continuous Infusions:   . dextrose 5 %-0.9% nacl with kcl 85 mL/hr at 08/18/11 0952  . TPN (CLINIMIX) +/- additives     And  . fat emulsion    . TPN (CLINIMIX) +/- additives 40 mL/hr at 08/17/11 1744  . DISCONTD: dextrose 5 % and 0.45 % NaCl with KCl 20 mEq/L 85 mL/hr at 08/18/11 0014   PRN Meds:.diphenhydrAMINE, diphenhydrAMINE, haloperidol lactate, LORazepam, naloxone, ondansetron (ZOFRAN) IV, ondansetron (ZOFRAN) IV, ondansetron, promethazine, sodium chloride, sodium chloride  Ht: 5\' 3"  (160 cm)  Wt:  91 lb (41.277 kg)  Ideal Wt: 52.3kg % Ideal Wt: 78  Usual Wt: 44.5kg % Usual Wt: 92  Body mass index is 16.12 kg/(m^2).  Food/Nutrition Related Hx: POD #4 exploratory laparotomy with lysis of adhesions, revision of gastrojejunostomy to a retrocolic gastrojejunostomy (Roux-en-Y), and upper endoscopy. RN requests, per husband, that no one disturb pt while asleep as pt did not have a good night, currently in restraints, in alcohol withdrawal. Per office notes, pt has lost 8 pound unintentionally r/t pain in the past month. Pt currently NPO on TNA of Clinimix 5/15 at 33ml/hr with 20% fat emulsion running at 73ml/hr on MWF which provides an average of 887 calories and 48g of protein which meets 59% calorie needs and 80% protein needs. Noted thiamine and folic acid added to TNA r/t history of alcohol abuse. Noted pt was having trouble with nausea/vomiting the day after surgery and an NGT and PICC were placed.   CT of abdomen and pelvis on 1/15 showed: Proximal small bowel obstruction. The transition point is  suspected in the left upper quadrant but there is no obstructing  mass.   Labs:  CMP     Component Value Date/Time   NA 123* 08/18/2011 0640   K 3.5 08/18/2011 0640   CL 87* 08/18/2011 0640   CO2 29 08/18/2011 0640   GLUCOSE 145* 08/18/2011 0640   BUN 5* 08/18/2011 1610  CREATININE 0.33* 08/18/2011 0640   CALCIUM 8.6 08/18/2011 0640   PROT 5.6* 08/18/2011 0640   ALBUMIN 2.4* 08/18/2011 0640   AST 26 08/18/2011 0640   ALT 13 08/18/2011 0640   ALKPHOS 70 08/18/2011 0640   BILITOT 0.6 08/18/2011 0640   GFRNONAA >90 08/18/2011 0640   GFRAA >90 08/18/2011 0640   - Noted pharmacist increased sodium and potassium in IVF, and ordered CBGs q8h with plans to add SSI only if glucose >150mg /dL.   CBG (last 3)   Basename 08/17/11 2127 08/17/11 1408  GLUCAP 137* 153*    Intake/Output Summary (Last 24 hours) at 08/18/11 1555 Last data filed at 08/18/11 1215  Gross per 24 hour  Intake     30 ml    Output   2760 ml  Net  -2730 ml   Last BM - 08/13/11 NGT - so far this shift   Diet Order: NPO  IVF:    dextrose 5 %-0.9% nacl with kcl Last Rate: 85 mL/hr at 08/18/11 9147  TPN (CLINIMIX) +/- additives   And   fat emulsion   TPN (CLINIMIX) +/- additives Last Rate: 40 mL/hr at 08/17/11 1744  DISCONTD: dextrose 5 % and 0.45 % NaCl with KCl 20 mEq/L Last Rate: 85 mL/hr at 08/18/11 0014    Estimated Nutritional Needs:   Kcal:1500-1650 Protein:60-75g Fluid:1.5-1.6L  NUTRITION DIAGNOSIS: -Inadequate oral intake (NI-2.1).  Status: Ongoing  RELATED TO: bowel obstruction, alcohol withdrawal  AS EVIDENCE BY: MD progress notes, CT of abdomen/pelvis on 08/05/11, NPO, pt on TNA to meet nutritional needs  MONITORING/EVALUATION(Goals): TNA to meet >90% of estimated nutritional needs.  EDUCATION NEEDS: -Education not appropriate at this time  INTERVENTION: TNA per pharmacy. Will monitor for NGT output and removal, in addition to diet and TNA advancement.   Dietitian #: [id number]  DOCUMENTATION CODES Per approved criteria  -Underweight    Marshall Cork 08/18/2011, 3:40 PM

## 2011-08-18 NOTE — Progress Notes (Signed)
PARENTERAL NUTRITION CONSULT NOTE - Follow Up  Pharmacy Consult for TNA Indication: bowel obstruction, revision of gastrojejunostomy for marginal ulcer    Allergies  Allergen Reactions  . Sulfonamide Derivatives Anaphylaxis and Rash  . Penicillins     REACTION: unspecified  . Hydromorphone Hcl Rash    Patient Measurements: Height: 5\' 3"  (160 cm) Weight: 91 lb (41.277 kg) IBW/kg (Calculated) : 52.4   Vital Signs: Temp: 97.3 F (36.3 C) (01/28 0528) Temp src: Oral (01/28 0528) BP: 154/73 mmHg (01/28 0528) Pulse Rate: 78  (01/28 0528) Intake/Output from previous day: 01/27 0701 - 01/28 0700 In: 30 [NG/GT:30] Out: 4660 [Urine:4150; Emesis/NG output:510] Intake/Output from this shift:    Labs:  [facility] 08/18/11 0640 08/17/11 0620 08/16/11 0507  WBC 7.0 9.1 9.5  HGB 10.3* 9.6* 9.9*  HCT 29.0* 27.6* 28.0*  PLT 252 252 267  APTT -- -- --  INR -- -- --     Basename 08/18/11 0640 08/17/11 0620 08/16/11 0507  NA 123* 124* 125*  K 3.5 3.7 4.3  CL 87* 89* 92*  CO2 29 31 29   GLUCOSE 145* 130* 132*  BUN 5* 3* 4*  CREATININE 0.33* 0.37* 0.43*  LABCREA -- -- --  CREAT24HRUR -- -- --  CALCIUM 8.6 8.3* 8.1*  MG 1.9 1.7 1.8  PHOS 3.2 2.3 3.0  PROT 5.6* 5.6* 5.0*  ALBUMIN 2.4* 2.4* 2.1*  AST 26 21 18   ALT 13 11 9   ALKPHOS 70 67 62  BILITOT 0.6 0.4 0.4  BILIDIR -- -- --  IBILI -- -- --  PREALBUMIN -- 10.5* --  TRIG -- 65 --  CHOLHDL -- -- --  CHOL -- 158 --   Estimated Creatinine Clearance: 49.4 ml/min (by C-G formula based on Cr of 0.33).    Basename 08/17/11 2127 08/17/11 1408  GLUCAP 137* 153*    CBG: 145(no SSI)  Medications:  Scheduled:     . antiseptic oral rinse  15 mL Mouth Rinse q12n4p  . chlorhexidine  15 mL Mouth Rinse BID  . Chlorhexidine Gluconate Cloth  6 each Topical Q0600  . haloperidol lactate      . heparin  5,000 Units Subcutaneous Q8H  . morphine   Intravenous Q4H  . mupirocin ointment  1 application Nasal BID  . pantoprazole  (PROTONIX) IV  40 mg Intravenous Q12H   Infusions:     . dextrose 5 % and 0.45 % NaCl with KCl 20 mEq/L 85 mL/hr at 08/18/11 0014  . TPN (CLINIMIX) +/- additives 40 mL/hr at 08/17/11 1744    Current Nutrition:  NPO, ice chips. D5 1/2 NS with 20KCL 85 ml/hr D10W 74ml/hr while TNA interrupted  Assessment:  61 yo female s/p exploratory laparotomy with revision of gastrojejunostomy for marginal ulcer - 08/14/2011.  Sodium low.  Potassium low-normal.  Nutritional Goals:  RD recommendations pending;  For now, E5/15 at goal rate 12ml/hr + 16ml/hr lipids MWF will provide 66g/d protein, 1417 Kcal MWF, 937 Kcal other days, avg/day/week 1143.  Plan:   Continue Clinimix E5/15 at 78ml/hr + 48ml/hr lipids.   MVI and trace elements MWF only due to ongoing shortage.  Add thiamine and folic acid to TNA due to hx EtOH abuse  CBGs q8h. Glucose controlled. Will add SSI only if >150.  Continue MIVF 72ml/hr to maintain 138ml/hr total.  Increase sodium and potassium content in IVF.  Bmet in am  Charolotte Eke, PharmD, pager [phone]. 08/18/2011,8:11 AM.

## 2011-08-18 NOTE — Progress Notes (Signed)
Sitter and husband at bedside.  Patient released from bil wrist restraints with sitter and husband at bedside.  Applied mittens to bil hands.  Pt not attempting to pull at lines and with Ativan and Haldol pt less agitated and restless.  Continue to monitor patient and sitter instructed to call if pt becomes agitated or violent.

## 2011-08-19 LAB — BASIC METABOLIC PANEL
CO2: 28 mEq/L (ref 19–32)
Calcium: 8.4 mg/dL (ref 8.4–10.5)
GFR calc Af Amer: >90 mL/min (ref >90)
GFR calc non Af Amer: >90 mL/min (ref >90)
Sodium: 127 mEq/L — ABNORMAL LOW (ref 135–145)

## 2011-08-19 MED ORDER — THIAMINE HCL 100 MG/ML IJ SOLN
INTRAVENOUS | Status: DC
  Filled 2011-08-19: qty 1000

## 2011-08-19 MED ORDER — POTASSIUM CHLORIDE 2 MEQ/ML IV SOLN
INTRAVENOUS | Status: DC
  Administered 2011-08-19 – 2011-08-22 (×3): via INTRAVENOUS
  Filled 2011-08-19 (×12): qty 1000

## 2011-08-19 MED ORDER — CLINIMIX E/DEXTROSE (5/15) 5 % IV SOLN
INTRAVENOUS | Status: AC
  Administered 2011-08-19: 18:00:00 via INTRAVENOUS
  Filled 2011-08-19: qty 2000

## 2011-08-19 NOTE — Progress Notes (Signed)
PARENTERAL NUTRITION CONSULT NOTE - Follow Up  Pharmacy Consult for TNA Indication: bowel obstruction, revision of gastrojejunostomy for marginal ulcer    Allergies  Allergen Reactions  . Sulfonamide Derivatives Anaphylaxis and Rash  . Penicillins     REACTION: unspecified  . Hydromorphone Hcl Rash    Patient Measurements: Height: 5\' 3"  (160 cm) Weight: 91 lb (41.277 kg) IBW/kg (Calculated) : 52.4  % Ideal Wt: 78  Usual Wt: 44.5kg  % Usual Wt: 92  Vital Signs: Temp: 97.6 F (36.4 C) (01/29 0636) Temp src: Oral (01/29 0636) BP: 121/72 mmHg (01/29 0636) Pulse Rate: 73  (01/29 0636) Intake/Output from previous day: 01/28 0701 - 01/29 0700 In: 1730.1 [I.V.:1120.1; IV Piggyback:10; TPN:600] Out: 2050 [Urine:2050] Intake/Output from this shift:    Labs:  [facility] 08/18/11 0640 08/17/11 0620  WBC 7.0 9.1  HGB 10.3* 9.6*  HCT 29.0* 27.6*  PLT 252 252  APTT -- --  INR -- --     Basename 08/19/11 0542 08/18/11 0640 08/17/11 0620  NA 127* 123* 124*  K 4.0 3.5 3.7  CL 94* 87* 89*  CO2 28 29 31   GLUCOSE 142* 145* 130*  BUN 6 5* 3*  CREATININE 0.34* 0.33* 0.37*  LABCREA -- -- --  CREAT24HRUR -- -- --  CALCIUM 8.4 8.6 8.3*  MG -- 1.9 1.7  PHOS -- 3.2 2.3  PROT -- 5.6* 5.6*  ALBUMIN -- 2.4* 2.4*  AST -- 26 21  ALT -- 13 11  ALKPHOS -- 70 67  BILITOT -- 0.6 0.4  BILIDIR -- -- --  IBILI -- -- --  PREALBUMIN -- 9.1* 10.5*  TRIG -- 43 65  CHOLHDL -- -- --  CHOL -- 162 578   Estimated Creatinine Clearance: 49.4 ml/min (by C-G formula based on Cr of 0.34).    Basename 08/17/11 2127 08/17/11 1408  GLUCAP 137* 153*   No SSI required over 24hrs  Medications:  Scheduled:     . antiseptic oral rinse  15 mL Mouth Rinse q12n4p  . chlorhexidine  15 mL Mouth Rinse BID  . Chlorhexidine Gluconate Cloth  6 each Topical Q0600  . heparin  5,000 Units Subcutaneous Q8H  . morphine   Intravenous Q4H  . mupirocin ointment  1 application Nasal BID  . pantoprazole  (PROTONIX) IV  40 mg Intravenous Q12H    Current Nutrition:  NPO, ice chips. Clinimix E5/15 26ml/hr 20% Lipids 28ml/hr D5 NS with 30KCL 85 ml/hr  Assessment:  61 yo female s/p exploratory laparotomy with revision of gastrojejunostomy for marginal ulcer - 08/14/2011.  Sodium low but improving with increase in IVF  Potassium low-normal, improving with increase in IVF  Nutritional Goals:   RD recommendations: Kcal:1500-1650, Protein:60-75g, Fluid:1.5-1.6L  E5/15 at goal rate 28ml/hr + 68ml/hr lipids MWF will provide 72g/d protein, 1502 Kcal MWF, 1022 Kcal other days, avg/day/week 1227.  Plan:   Advance Clinimix E5/15 to 60ml/hr   Lipids MWF only due to ongoing backorder   MVI and trace elements MWF only due to ongoing shortage.  Thiamine and folic acid to TNA due to hx EtOH abuse  CBGs q8h. Glucose controlled. Will add SSI only if >150.  Decrease MIVF 60ml/hr to maintain 17ml/hr total.  Bmet in am.  Loralee Pacas, PharmD, BCPS Pager: [phone] 08/19/2011, 8:09 AM

## 2011-08-19 NOTE — Progress Notes (Signed)
General Surgery Note  LOS: 5 days  POD# 5 GI - Dr. Sharia Reeve, PCP - Dr. Jeannetta Nap.  Assessment/Plan:  1.  EXPLORATORY LAPAROTOMY, revision of gastrojejunostomy for marginal ulcer - 08/14/2011. Her mental picture is much better.  She said she was having strange dreams. She has passed flatus, but no BM.  Will try NGT out.   2.  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  3.  History of anxiety.  4.  Low body weight.  5.  MRSA nasal swab positive.  6.  Nutrition - on TPN.   Thiamine/Folic acid added.  7.  DVT prophylaxis - SQ heparin  8.  Withdrawal symptoms -  takes xanax and EtOH at home (Unknown amounts) - these have resolved and she looks good today.  9.  Na low, but better  Subjective:  Looks much better.  Her husband is at the bedside.  She thinks the NGT is causing some of her nausea. Objective:   Filed Vitals:   08/19/11 0727  BP:   Pulse:   Temp:   Resp: 16     Intake/Output from previous day:  01/28 0701 - 01/29 0700 In: 1730.1 [I.V.:1120.1; IV Piggyback:10; TPN:600] Out: 2050 [Urine:2050]  Intake/Output this shift:      Physical Exam:   General: [name].  The NGT is her biggest problem now.   HEENT: Normal.   Lungs: Clear   Abdomen: She does have BS.   Wound: Looks good, will leave open.   Neurologic:  Grossly intact to motor and sensory function.   Psychiatric: Confusion has cleared.  Much better oriented.  Lab Results:    Basename 08/18/11 0640 08/17/11 0620  WBC 7.0 9.1  HGB 10.3* 9.6*  HCT 29.0* 27.6*  PLT 252 252    BMET    Basename 08/19/11 0542 08/18/11 0640  NA 127* 123*  K 4.0 3.5  CL 94* 87*  CO2 28 29  GLUCOSE 142* 145*  BUN 6 5*  CREATININE 0.34* 0.33*  CALCIUM 8.4 8.6    PT/INR   No results found for this basename: LABPROT:2,INR:2 in the last 72 hours  ABG  No results found for this basename: PHART:2,PCO2:2,PO2:2,HCO3:2 in the last 72 hours   Studies/Results:  No results  found.   Anti-infectives:   Anti-infectives     Start     Dose/Rate Route Frequency Ordered Stop   08/14/11 2030   cefOXitin (MEFOXIN) 1 g in dextrose 5 % 50 mL IVPB        1 g 100 mL/hr over 30 Minutes Intravenous Every 6 hours 08/14/11 1949 08/15/11 0305   08/14/11 1000   cefOXitin (MEFOXIN) 1 g in dextrose 5 % 50 mL IVPB        1 g 100 mL/hr over 30 Minutes Intravenous 60 min pre-op 08/14/11 0954 08/14/11 1354          [name], MD, FACS Pager: [phone],   [facility] Office: [phone] 08/19/2011

## 2011-08-20 LAB — GLUCOSE, CAPILLARY
Glucose-Capillary: 120 mg/dL — ABNORMAL HIGH (ref 70–99)
Glucose-Capillary: 109 mg/dL — ABNORMAL HIGH (ref 70–99)
Glucose-Capillary: 130 mg/dL — ABNORMAL HIGH (ref 70–99)

## 2011-08-20 LAB — BASIC METABOLIC PANEL
GFR calc Af Amer: >90 mL/min (ref >90)
GFR calc non Af Amer: >90 mL/min (ref >90)
Glucose, Bld: 127 mg/dL — ABNORMAL HIGH (ref 70–99)
Potassium: 4.3 mEq/L (ref 3.5–5.1)
Sodium: 129 mEq/L — ABNORMAL LOW (ref 135–145)

## 2011-08-20 MED ORDER — INSULIN ASPART 100 UNIT/ML ~~LOC~~ SOLN
0.0000 [IU] | Freq: Three times a day (TID) | SUBCUTANEOUS | Status: DC
  Filled 2011-08-20: qty 3

## 2011-08-20 MED ORDER — FAT EMULSION 20 % IV EMUL
250.0000 mL | INTRAVENOUS | Status: AC
  Administered 2011-08-20: 250 mL via INTRAVENOUS
  Filled 2011-08-20: qty 250

## 2011-08-20 MED ORDER — INSULIN ASPART 100 UNIT/ML ~~LOC~~ SOLN
0.0000 [IU] | Freq: Three times a day (TID) | SUBCUTANEOUS | Status: DC
  Administered 2011-08-20 – 2011-08-22 (×2): 1 [IU] via SUBCUTANEOUS
  Filled 2011-08-20: qty 3

## 2011-08-20 MED ORDER — TRACE MINERALS CR-CU-MN-SE-ZN 10-1000-500-60 MCG/ML IV SOLN
INTRAVENOUS | Status: AC
  Administered 2011-08-20: 17:00:00 via INTRAVENOUS
  Filled 2011-08-20: qty 2000

## 2011-08-20 NOTE — Progress Notes (Signed)
Nutrition Follow-up  Diet Order: NPO TPN: Patient is receiving TPN with Clinimix E 5/15 @ 50 ml/hr.  Lipids (20% IVFE @ 10 ml/hr), multivitamins, and trace elements are provided 3 times weekly (MWF) due to national backorder.  Provides 1058 kcal and 60 grams protein daily (based on weekly average).  Meets 70% minimum estimated kcal and 100% minimum estimated protein needs.  Additional IVF with D5NS with potassium chloride 30 mEq infusion @ 75 ml/hr.  - NGT d/c yesterday. Pt reports having nausea most of the day yesterday and some still today - notified RN. Pt reports PTA she ate poorly and lost 8 pounds in the past month r/t pain. Pt states she is eager to have some food.   Meds: Scheduled Meds:   . antiseptic oral rinse  15 mL Mouth Rinse q12n4p  . chlorhexidine  15 mL Mouth Rinse BID  . Chlorhexidine Gluconate Cloth  6 each Topical Q0600  . heparin  5,000 Units Subcutaneous Q8H  . insulin aspart  0-9 Units Subcutaneous Q8H  . morphine   Intravenous Q4H  . mupirocin ointment  1 application Nasal BID  . pantoprazole (PROTONIX) IV  40 mg Intravenous Q12H  . DISCONTD: insulin aspart  0-9 Units Subcutaneous TID WC   Continuous Infusions:   . dextrose 5 %-0.9% nacl with kcl 85 mL/hr at 08/18/11 2248  . dextrose 5 %-0.9% nacl with kcl 75 mL/hr at 08/19/11 1813  . TPN (CLINIMIX) +/- additives 40 mL/hr at 08/18/11 1742   And  . fat emulsion 240 mL (08/18/11 1742)  . fat emulsion    . TPN (CLINIMIX) +/- additives    . TPN (CLINIMIX) +/- additives 50 mL/hr at 08/19/11 1753   PRN Meds:.diphenhydrAMINE, diphenhydrAMINE, haloperidol lactate, LORazepam, naloxone, ondansetron (ZOFRAN) IV, ondansetron (ZOFRAN) IV, ondansetron, promethazine, sodium chloride, sodium chloride  Labs:  CMP     Component Value Date/Time   NA 129* 08/20/2011 0510   K 4.3 08/20/2011 0510   CL 93* 08/20/2011 0510   CO2 28 08/20/2011 0510   GLUCOSE 127* 08/20/2011 0510   BUN 7 08/20/2011 0510   CREATININE 0.36* 08/20/2011  0510   CALCIUM 8.8 08/20/2011 0510   PROT 5.6* 08/18/2011 0640   ALBUMIN 2.4* 08/18/2011 0640   AST 26 08/18/2011 0640   ALT 13 08/18/2011 0640   ALKPHOS 70 08/18/2011 0640   BILITOT 0.6 08/18/2011 0640   GFRNONAA >90 08/20/2011 0510   GFRAA >90 08/20/2011 0510   CBG (last 3)   Basename 08/17/11 2127 08/17/11 1408  GLUCAP 137* 153*   1/28 PALB 9.1, slightly down from 10.5 on 1/27  - Sodium remains low but improving with increase in IVF - Potassium repleted with increase in IVF - SSI added only sensitive scale r/t CBGs >150   Intake/Output Summary (Last 24 hours) at 08/20/11 1247 Last data filed at 08/20/11 1130  Gross per 24 hour  Intake 1519.92 ml  Output   1650 ml  Net -130.08 ml   Last BM - 08/13/11  Weight Status:  No new weights  Nutrition Dx: Inadequate oral intake - ongoing  Goal: TNA to meet >90% of estimated nutritional needs - not met yet but advancing to goal rate with next bag.  Intervention: TNA per pharmacy. Diet advancement per MD.   Monitor: Weights, labs, BM, diet and TNA advancement   Marshall Cork Pager #: [phone]

## 2011-08-20 NOTE — Plan of Care (Signed)
Problem: Inadequate Intake (NI-2.1) Goal: Food and/or nutrient delivery Individualized approach for food/nutrient provision.  Outcome: Not Progressing Pt remains NPO     

## 2011-08-20 NOTE — Progress Notes (Signed)
PARENTERAL NUTRITION CONSULT NOTE - Follow Up  Pharmacy Consult for TNA Indication: bowel obstruction, revision of gastrojejunostomy for marginal ulcer    Allergies  Allergen Reactions  . Sulfonamide Derivatives Anaphylaxis and Rash  . Penicillins     REACTION: unspecified  . Hydromorphone Hcl Rash    Patient Measurements: Height: 5\' 3"  (160 cm) Weight: 91 lb (41.277 kg) IBW/kg (Calculated) : 52.4  % Ideal Wt: 78  Usual Wt: 44.5kg  % Usual Wt: 92  Vital Signs: Temp: 98.1 F (36.7 C) (01/30 0600) Temp src: Oral (01/30 0600) BP: 148/72 mmHg (01/30 0600) Pulse Rate: 66  (01/30 0600) Intake/Output from previous day: 01/29 0701 - 01/30 0700 In: 1519.9 [I.V.:838.8; TPN:681.2] Out: 1300 [Urine:1300] Intake/Output from this shift:    Labs:  [facility] 08/18/11 0640  WBC 7.0  HGB 10.3*  HCT 29.0*  PLT 252  APTT --  INR --     Basename 08/20/11 0510 08/19/11 0542 08/18/11 0640  NA 129* 127* 123*  K 4.3 4.0 3.5  CL 93* 94* 87*  CO2 28 28 29   GLUCOSE 127* 142* 145*  BUN 7 6 5*  CREATININE 0.36* 0.34* 0.33*  LABCREA -- -- --  CREAT24HRUR -- -- --  CALCIUM 8.8 8.4 8.6  MG -- -- 1.9  PHOS -- -- 3.2  PROT -- -- 5.6*  ALBUMIN -- -- 2.4*  AST -- -- 26  ALT -- -- 13  ALKPHOS -- -- 70  BILITOT -- -- 0.6  BILIDIR -- -- --  IBILI -- -- --  PREALBUMIN -- -- 9.1*  TRIG -- -- 43  CHOLHDL -- -- --  CHOL -- -- 162   Estimated Creatinine Clearance: 49.4 ml/min (by C-G formula based on Cr of 0.36).    Basename 08/17/11 2127 08/17/11 1408  GLUCAP 137* 153*   No SSI required over 24hrs  Medications:  Scheduled:     . antiseptic oral rinse  15 mL Mouth Rinse q12n4p  . chlorhexidine  15 mL Mouth Rinse BID  . Chlorhexidine Gluconate Cloth  6 each Topical Q0600  . heparin  5,000 Units Subcutaneous Q8H  . morphine   Intravenous Q4H  . mupirocin ointment  1 application Nasal BID  . pantoprazole (PROTONIX) IV  40 mg Intravenous Q12H    Current Nutrition:  NPO,  ice chips. Clinimix E5/15 64ml/hr D5 NS with 30KCL 75 ml/hr  Assessment:  61 yo female s/p exploratory laparotomy with revision of gastrojejunostomy for marginal ulcer - 08/14/2011.  Sodium low but improving with increase in IVF  Potassium repleted with increase in IVF  NGT removed, +flatus, no BM recorded  Nutritional Goals:   RD recommendations: Kcal:1500-1650, Protein:60-75g, Fluid:1.5-1.6L  E5/15 at goal rate 28ml/hr + 51ml/hr lipids MWF will provide 72g/d protein, 1502 Kcal MWF, 1022 Kcal other days, avg/day/week 1227.  Plan:   Advance Clinimix E5/15 to goal 23ml/hr   Decrease MIVF 44ml/hr to maintain 170ml/hr total  Lipids MWF only due to ongoing backorder   MVI and trace elements MWF only due to ongoing shortage  Thiamine and folic acid to TNA due to hx EtOH abuse  CBGs q8h. Glucose now >150. Will add SSI only sensitive scale  TNA labs in am  Loralee Pacas, PharmD, BCPS Pager: [phone] 08/20/2011, 7:26 AM

## 2011-08-20 NOTE — Progress Notes (Signed)
General Surgery Note  LOS: 6 days  POD# 6 GI - Dr. Sharia Reeve, PCP - Dr. Jeannetta Nap.  Assessment/Plan:  1.  EXPLORATORY LAPAROTOMY, revision of gastrojejunostomy for marginal ulcer - 08/14/2011. Mentally continues to do well. She says she is hungry. Passed flatus.  I am a little hesitant to feed her yet.  Will wait 24 more hours, if doing well start diet.   2.  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  3.  History of anxiety.  4.  Low body weight.  5.  MRSA nasal swab positive.  On isolation.  6.  Nutrition - on TPN.   Thiamine/Folic acid added.  7.  DVT prophylaxis - SQ heparin  8.  Withdrawal symptoms -  takes xanax and EtOH at home (Unknown amounts) - resolved.  9.  Na low. Continues to improve.  Subjective:  Continues to improve.  Has passed flatus. Says she is very hungry. Objective:   Filed Vitals:   08/20/11 0600  BP: 148/72  Pulse: 66  Temp: 98.1 F (36.7 C)  Resp: 18     Intake/Output from previous day:  01/29 0701 - 01/30 0700 In: 1519.9 [I.V.:838.8; TPN:681.2] Out: 1300 [Urine:1300]  Intake/Output this shift:      Physical Exam:   General: [name].     HEENT: Normal.   Lungs: Clear   Abdomen: She does have BS.   Wound: Looks good, will leave open.   Neurologic:  Grossly intact to motor and sensory function.   Psychiatric: Confusion has cleared.   Lab Results:    [facility] 08/18/11 0640  WBC 7.0  HGB 10.3*  HCT 29.0*  PLT 252    BMET    Basename 08/20/11 0510 08/19/11 0542  NA 129* 127*  K 4.3 4.0  CL 93* 94*  CO2 28 28  GLUCOSE 127* 142*  BUN 7 6  CREATININE 0.36* 0.34*  CALCIUM 8.8 8.4    PT/INR   No results found for this basename: LABPROT:2,INR:2 in the last 72 hours  ABG  No results found for this basename: PHART:2,PCO2:2,PO2:2,HCO3:2 in the last 72 hours   Studies/Results:  No results found.   Anti-infectives:   Anti-infectives     Start     Dose/Rate Route Frequency Ordered Stop   08/14/11 2030    cefOXitin (MEFOXIN) 1 g in dextrose 5 % 50 mL IVPB        1 g 100 mL/hr over 30 Minutes Intravenous Every 6 hours 08/14/11 1949 08/15/11 0305   08/14/11 1000   cefOXitin (MEFOXIN) 1 g in dextrose 5 % 50 mL IVPB        1 g 100 mL/hr over 30 Minutes Intravenous 60 min pre-op 08/14/11 0954 08/14/11 1354          [name], MD, FACS Pager: [phone],   [facility] [place] [facility] Office: [phone] 08/20/2011

## 2011-08-21 LAB — COMPREHENSIVE METABOLIC PANEL
ALT: 13 U/L (ref 0–35)
AST: 18 U/L (ref 0–37)
Albumin: 2.3 g/dL — ABNORMAL LOW (ref 3.5–5.2)
Alkaline Phosphatase: 65 U/L (ref 39–117)
Potassium: 4.5 mEq/L (ref 3.5–5.1)
Sodium: 128 mEq/L — ABNORMAL LOW (ref 135–145)
Total Protein: 5.5 g/dL — ABNORMAL LOW (ref 6.0–8.3)

## 2011-08-21 LAB — GLUCOSE, CAPILLARY: Glucose-Capillary: 113 mg/dL — ABNORMAL HIGH (ref 70–99)

## 2011-08-21 MED ORDER — LORAZEPAM 2 MG/ML IJ SOLN: 1.0000 mg | Freq: Four times a day (QID) | INTRAMUSCULAR | Status: DC | PRN

## 2011-08-21 MED ORDER — HYDROCODONE-ACETAMINOPHEN 5-325 MG PO TABS
1.0000 | ORAL_TABLET | ORAL | Status: DC | PRN
  Administered 2011-08-21 – 2011-08-23 (×8): 2 via ORAL
  Administered 2011-08-23: 1 via ORAL
  Administered 2011-08-23 – 2011-08-24 (×5): 2 via ORAL
  Filled 2011-08-21: qty 1
  Filled 2011-08-21 (×10): qty 2
  Filled 2011-08-21: qty 1
  Filled 2011-08-21 (×3): qty 2

## 2011-08-21 MED ORDER — CLINIMIX E/DEXTROSE (5/15) 5 % IV SOLN
INTRAVENOUS | Status: DC
  Administered 2011-08-21: 18:00:00 via INTRAVENOUS
  Filled 2011-08-21: qty 2000

## 2011-08-21 NOTE — Progress Notes (Signed)
 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 She has begun to move her bowels.  She denies that she was using any salicylates and says no EtOH in 3 months.  Hopefully she will be significantly improved after this surgery. i would expect her to have some difficulties with abdominal pain and post-vagotomy issues. We will see.   If she was not taking salicylates or NSAIDS then ? Where ulcer came from.  Though there is not necessarily any good data to support, if she develops another ulcer would use misoprostol if tolerated.  Iva Boop, MD, [facility] [phone] (pager) 08/21/2011 12:45 PM

## 2011-08-21 NOTE — Progress Notes (Signed)
PARENTERAL NUTRITION CONSULT NOTE - Follow Up  Pharmacy Consult for TNA Indication: bowel obstruction, revision of gastrojejunostomy for marginal ulcer    Allergies  Allergen Reactions  . Sulfonamide Derivatives Anaphylaxis and Rash  . Penicillins     REACTION: unspecified  . Hydromorphone Hcl Rash    Patient Measurements: Height: 5\' 3"  (160 cm) Weight: 91 lb (41.277 kg) IBW/kg (Calculated) : 52.4  % Ideal Wt: 78  Usual Wt: 44.5kg  % Usual Wt: 92  Vital Signs: Temp: 98.3 F (36.8 C) (01/31 0600) Temp src: Oral (01/31 0600) BP: 130/65 mmHg (01/31 0600) Pulse Rate: 64  (01/31 0600) Intake/Output from previous day: 01/30 0701 - 01/31 0700 In: 4689.8 [I.V.:2447.8; ZOX:0960] Out: 2375 [Urine:2375] Intake/Output from this shift:    Labs: No results found for this basename: WBC:3,HGB:3,HCT:3,PLT:3,APTT:3,INR:3 in the last 72 hours   Basename 08/21/11 0530 08/20/11 0510 08/19/11 0542  NA 128* 129* 127*  K 4.5 4.3 4.0  CL 92* 93* 94*  CO2 29 28 28   GLUCOSE 115* 127* 142*  BUN 9 7 6   CREATININE 0.33* 0.36* 0.34*  LABCREA -- -- --  CREAT24HRUR -- -- --  CALCIUM 8.6 8.8 8.4  MG 2.0 -- --  PHOS 4.6 -- --  PROT 5.5* -- --  ALBUMIN 2.3* -- --  AST 18 -- --  ALT 13 -- --  ALKPHOS 65 -- --  BILITOT 0.2* -- --  BILIDIR -- -- --  IBILI -- -- --  PREALBUMIN -- -- --  TRIG -- -- --  CHOLHDL -- -- --  CHOL -- -- --   Estimated Creatinine Clearance: 49.4 ml/min (by C-G formula based on Cr of 0.33).    Basename 08/21/11 0539 08/20/11 2218 08/20/11 1544  GLUCAP 115* 130* 109*   1 unit SSI required over 24hrs  Medications:  Scheduled:     . antiseptic oral rinse  15 mL Mouth Rinse q12n4p  . chlorhexidine  15 mL Mouth Rinse BID  . Chlorhexidine Gluconate Cloth  6 each Topical Q0600  . heparin  5,000 Units Subcutaneous Q8H  . insulin aspart  0-9 Units Subcutaneous Q8H  . morphine   Intravenous Q4H  . mupirocin ointment  1 application Nasal BID  . pantoprazole  (PROTONIX) IV  40 mg Intravenous Q12H  . DISCONTD: insulin aspart  0-9 Units Subcutaneous TID WC    Current Nutrition:  NPO, ice chips. Clinimix E5/15 69ml/hr 20% Lipids 49ml/hr D5 NS with 30KCL 65 ml/hr  Assessment:  61 yo female s/p exploratory laparotomy with revision of gastrojejunostomy for marginal ulcer - 08/14/2011.  Sodium low on current IVF  Potassium repleted with increase in IVF  NGT removed, pt states she is hungry, +flatus, 1 BM recorded 1/30  MD notes indicate may start diet today  Nutritional Goals:   RD recommendations: Kcal:1500-1650, Protein:60-75g, Fluid:1.5-1.6L  E5/15 at goal rate 49ml/hr + 81ml/hr lipids MWF will provide 72g/d protein, 1502 Kcal MWF, 1022 Kcal other days, avg/day/week 1227.  Plan:   Continue Clinimix E5/15 to goal 71ml/hr   Continue MIVF 21ml/hr to maintain 177ml/hr total  Lipids MWF only due to ongoing backorder   MVI and trace elements MWF only due to ongoing shortage  Continue thiamine and folic acid due to hx EtOH abuse  Phos at high end, cannot adjust amount in TNA  Na remains low, cannot adjust amount in TNA  Glucose controlled on SSI sensitive scale  BMET in am  F/U plans for diet and ability to wean TNA  Loralee Pacas, PharmD, BCPS Pager: [phone] 08/21/2011, 7:35 AM

## 2011-08-21 NOTE — Progress Notes (Signed)
General Surgery Note  LOS: 7 days  POD# 7 GI - Dr. Sharia Reeve, PCP - Dr. Jeannetta Nap.  Assessment/Plan:  1.  EXPLORATORY LAPAROTOMY, revision of gastrojejunostomy for marginal ulcer - 08/14/2011. Had 2 BM's - to start clear liquids   2.  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  3.  History of anxiety.  4.  Low body weight.  5.  MRSA nasal swab positive.  On isolation.  6.  Nutrition - on TPN.  Tot Prot - 5.5 (08/21/2011).  Will continue at least one more day and see how she tolerates po's. Thiamine/Folic acid added.  7.  DVT prophylaxis - SQ heparin  8.  Withdrawal symptoms -  takes xanax and EtOH at home (Unknown amounts) - resolved.  9.  Na low.  Subjective:  Had 2 BM's.  Hungry.  No nausea.  Objective:   Filed Vitals:   08/21/11 0600  BP: 130/65  Pulse: 64  Temp: 98.3 F (36.8 C)  Resp: 18     Intake/Output from previous day:  01/30 0701 - 01/31 0700 In: 4689.8 [I.V.:2447.8; UJW:1191] Out: 2375 [Urine:2375]  Intake/Output this shift:      Physical Exam:   General: [name].     HEENT: Normal.   Lungs: Clear   Abdomen: She does have BS.   Wound: Looks good, will leave open.   Neurologic:  Grossly intact to motor and sensory function.   Psychiatric: Confusion has cleared.   Lab Results:   No results found for this basename: WBC:2,HGB:2,HCT:2,PLT:2 in the last 72 hours  BMET    Basename 08/21/11 0530 08/20/11 0510  NA 128* 129*  K 4.5 4.3  CL 92* 93*  CO2 29 28  GLUCOSE 115* 127*  BUN 9 7  CREATININE 0.33* 0.36*  CALCIUM 8.6 8.8    PT/INR   No results found for this basename: LABPROT:2,INR:2 in the last 72 hours  ABG  No results found for this basename: PHART:2,PCO2:2,PO2:2,HCO3:2 in the last 72 hours   Studies/Results:  No results found.   Anti-infectives:   Anti-infectives     Start     Dose/Rate Route Frequency Ordered Stop   08/14/11 2030   cefOXitin (MEFOXIN) 1 g in dextrose 5 % 50 mL IVPB        1 g 100 mL/hr over  30 Minutes Intravenous Every 6 hours 08/14/11 1949 08/15/11 0305   08/14/11 1000   cefOXitin (MEFOXIN) 1 g in dextrose 5 % 50 mL IVPB        1 g 100 mL/hr over 30 Minutes Intravenous 60 min pre-op 08/14/11 0954 08/14/11 1354          [name], MD, FACS Pager: [phone],   [facility] Office: [phone] 08/21/2011

## 2011-08-22 LAB — GLUCOSE, CAPILLARY
Glucose-Capillary: 91 mg/dL (ref 70–99)
Glucose-Capillary: 128 mg/dL — ABNORMAL HIGH (ref 70–99)
Glucose-Capillary: 108 mg/dL — ABNORMAL HIGH (ref 70–99)

## 2011-08-22 LAB — BASIC METABOLIC PANEL
BUN: 9 mg/dL (ref 6–23)
Chloride: 90 mEq/L — ABNORMAL LOW (ref 96–112)
Creatinine, Ser: 0.36 mg/dL — ABNORMAL LOW (ref 0.50–1.10)
GFR calc Af Amer: >90 mL/min (ref >90)
GFR calc non Af Amer: >90 mL/min (ref >90)
Glucose, Bld: 112 mg/dL — ABNORMAL HIGH (ref 70–99)

## 2011-08-22 MED ORDER — PANTOPRAZOLE SODIUM 40 MG PO TBEC
40.0000 mg | DELAYED_RELEASE_TABLET | Freq: Two times a day (BID) | ORAL | Status: DC
  Administered 2011-08-22 – 2011-08-24 (×5): 40 mg via ORAL
  Filled 2011-08-22 (×7): qty 1

## 2011-08-22 MED ORDER — VITAMIN B-1 100 MG PO TABS
100.0000 mg | ORAL_TABLET | Freq: Every day | ORAL | Status: DC
  Administered 2011-08-23 – 2011-08-24 (×2): 100 mg via ORAL
  Filled 2011-08-22 (×2): qty 1

## 2011-08-22 MED ORDER — FOLIC ACID 1 MG PO TABS
1.0000 mg | ORAL_TABLET | Freq: Every day | ORAL | Status: DC
  Administered 2011-08-23 – 2011-08-24 (×2): 1 mg via ORAL
  Filled 2011-08-22 (×2): qty 1

## 2011-08-22 MED ORDER — MUPIROCIN 2 % EX OINT
1.0000 "application " | TOPICAL_OINTMENT | Freq: Two times a day (BID) | CUTANEOUS | Status: DC
  Administered 2011-08-22 – 2011-08-24 (×4): 1 via NASAL
  Filled 2011-08-22: qty 22

## 2011-08-22 MED ORDER — CHLORHEXIDINE GLUCONATE CLOTH 2 % EX PADS
6.0000 | MEDICATED_PAD | Freq: Every day | CUTANEOUS | Status: DC
  Administered 2011-08-23 – 2011-08-24 (×2): 6 via TOPICAL

## 2011-08-22 MED ORDER — ENSURE CLINICAL ST REVIGOR PO LIQD
237.0000 mL | Freq: Three times a day (TID) | ORAL | Status: DC
  Administered 2011-08-22 – 2011-08-24 (×6): 237 mL via ORAL
  Filled 2011-08-22: qty 237

## 2011-08-22 NOTE — Progress Notes (Signed)
General Surgery Note  LOS: 8 days  POD# 8 GI - Dr. Sharia Reeve, PCP - Dr. Jeannetta Nap.  Assessment/Plan:  1.  EXPLORATORY LAPAROTOMY, revision of gastrojejunostomy for marginal ulcer - 08/14/2011. Tolerated clear liquids, though she said the greasy liquids made her nauseated.  Will start reg diet.  And D/C PCA.   2.  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  3.  History of anxiety.  4.  Low body weight.  5.  MRSA nasal swab positive.  On isolation.  6.  Nutrition - on TPN.  Tot Prot - 5.5 (08/21/2011).  To stop TPN now that I am advancing her diet.  7.  DVT prophylaxis - SQ heparin  8.  Withdrawal symptoms -  takes xanax and EtOH at home (Unknown amounts) - resolved.  9.  Na low.  Has drifted down after improving.  Subjective:  Tolerated liquids.  Had another BM.  Says she is confused about pain meds.  Objective:   Filed Vitals:   08/22/11 0600  BP: 133/62  Pulse: 64  Temp: 97.8 F (36.6 C)  Resp: 18     Intake/Output from previous day:  01/31 0701 - 02/01 0700 In: 3601.3 [P.O.:700; I.V.:1337; TPN:1564.3] Out: 3050 [Urine:3050]  Intake/Output this shift:  Total I/O In: -  Out: 300 [Urine:300]   Physical Exam:   General: [name].     HEENT: Normal.   Lungs: Clear   Abdomen: She does have BS.   Wound: Looks good.   Neurologic:  Grossly intact to motor and sensory function.   Psychiatric: Confusion has cleared.   Lab Results:   No results found for this basename: WBC:2,HGB:2,HCT:2,PLT:2 in the last 72 hours  BMET    [facility] 08/22/11 0443 08/21/11 0530  NA 125* 128*  K 4.2 4.5  CL 90* 92*  CO2 28 29  GLUCOSE 112* 115*  BUN 9 9  CREATININE 0.36* 0.33*  CALCIUM 8.7 8.6    PT/INR   No results found for this basename: LABPROT:2,INR:2 in the last 72 hours  ABG  No results found for this basename: PHART:2,PCO2:2,PO2:2,HCO3:2 in the last 72 hours   Studies/Results:  No results found.   Anti-infectives:   Anti-infectives     Start      Dose/Rate Route Frequency Ordered Stop   08/14/11 2030   cefOXitin (MEFOXIN) 1 g in dextrose 5 % 50 mL IVPB        1 g 100 mL/hr over 30 Minutes Intravenous Every 6 hours 08/14/11 1949 08/15/11 0305   08/14/11 1000   cefOXitin (MEFOXIN) 1 g in dextrose 5 % 50 mL IVPB        1 g 100 mL/hr over 30 Minutes Intravenous 60 min pre-op 08/14/11 0954 08/14/11 1354          [name], MD, FACS Pager: [phone],   [facility] Office: [phone] 08/22/2011

## 2011-08-22 NOTE — Plan of Care (Signed)
Problem: Inadequate Intake (NI-2.1) Goal: Food and/or nutrient delivery Individualized approach for food/nutrient provision.  Outcome: Progressing Diet advanced, pt eating some of meals

## 2011-08-22 NOTE — Progress Notes (Signed)
Nutrition Follow-up  Diet Order: Regular  - TNA stopped today. Pt reports eating the toast on her breakfast tray but not the eggs or sausage because they were cold. Pt denies any nausea. Pt interested in starting Ensure.   Meds: Scheduled Meds:   . antiseptic oral rinse  15 mL Mouth Rinse q12n4p  . chlorhexidine  15 mL Mouth Rinse BID  . folic acid  1 mg Oral Daily  . heparin  5,000 Units Subcutaneous Q8H  . mupirocin ointment  1 application Nasal BID  . pantoprazole  40 mg Oral BID AC  . thiamine  100 mg Oral Daily  . DISCONTD: insulin aspart  0-9 Units Subcutaneous Q8H  . DISCONTD: morphine   Intravenous Q4H  . DISCONTD: pantoprazole (PROTONIX) IV  40 mg Intravenous Q12H   Continuous Infusions:   . dextrose 5 %-0.9% nacl with kcl 50 mL/hr at 08/21/11 1800  . fat emulsion 250 mL (08/21/11 1404)  . TPN (CLINIMIX) +/- additives 60 mL/hr at 08/21/11 1404  . DISCONTD: TPN (CLINIMIX) +/- additives 60 mL/hr at 08/21/11 1755   PRN Meds:.haloperidol lactate, HYDROcodone-acetaminophen, LORazepam, ondansetron (ZOFRAN) IV, ondansetron, promethazine, sodium chloride, DISCONTD: diphenhydrAMINE, DISCONTD: diphenhydrAMINE, DISCONTD: naloxone, DISCONTD: ondansetron (ZOFRAN) IV, DISCONTD: sodium chloride  Labs:  CMP     Component Value Date/Time   NA 125* 08/22/2011 0443   K 4.2 08/22/2011 0443   CL 90* 08/22/2011 0443   CO2 28 08/22/2011 0443   GLUCOSE 112* 08/22/2011 0443   BUN 9 08/22/2011 0443   CREATININE 0.36* 08/22/2011 0443   CALCIUM 8.7 08/22/2011 0443   PROT 5.5* 08/21/2011 0530   ALBUMIN 2.3* 08/21/2011 0530   AST 18 08/21/2011 0530   ALT 13 08/21/2011 0530   ALKPHOS 65 08/21/2011 0530   BILITOT 0.2* 08/21/2011 0530   GFRNONAA >90 08/22/2011 0443   GFRAA >90 08/22/2011 0443   CBG (last 3)   Basename 08/22/11 0803 08/22/11 0600 08/21/11 2149  GLUCAP 116* 128* 91     Intake/Output Summary (Last 24 hours) at 08/22/11 0953 Last data filed at 08/22/11 0900  Gross per 24 hour  Intake 3301.33 ml    Output   3750 ml  Net -448.67 ml   Last BM - 08/21/11  Weight Status:  No new weights  Nutrition Dx: Inadequate oral intake - ongoing  Goal: TNA to meet >90% of estimated nutritional needs - not met, TNA stopped.  New goal: Pt to consume >90% of meals/supplements.   Intervention: Chocolate Ensure Clinical Strength TID. Encouraged gradual increased intake.   Monitor: Weights, labs, intake   Marshall Cork Pager #: [phone]

## 2011-08-23 LAB — BASIC METABOLIC PANEL
Calcium: 8.8 mg/dL (ref 8.4–10.5)
GFR calc Af Amer: >90 mL/min (ref >90)
GFR calc non Af Amer: >90 mL/min (ref >90)
Glucose, Bld: 118 mg/dL — ABNORMAL HIGH (ref 70–99)
Sodium: 121 mEq/L — ABNORMAL LOW (ref 135–145)

## 2011-08-23 LAB — GLUCOSE, CAPILLARY: Glucose-Capillary: 121 mg/dL — ABNORMAL HIGH (ref 70–99)

## 2011-08-23 MED ORDER — SODIUM CHLORIDE 0.9 % IV SOLN
INTRAVENOUS | Status: DC
  Administered 2011-08-23: 50 mL/h via INTRAVENOUS
  Administered 2011-08-24: 09:00:00 via INTRAVENOUS

## 2011-08-23 MED ORDER — HYDROCODONE-ACETAMINOPHEN 5-325 MG PO TABS: 1.0000 | ORAL_TABLET | ORAL | Status: AC | PRN

## 2011-08-23 MED ORDER — ADULT MULTIVITAMIN W/MINERALS CH
1.0000 | ORAL_TABLET | Freq: Every day | ORAL | Status: DC
  Administered 2011-08-23 – 2011-08-24 (×2): 1 via ORAL
  Filled 2011-08-23 (×2): qty 1

## 2011-08-23 MED ORDER — TAB-A-VITE/IRON PO TABS
1.0000 | ORAL_TABLET | Freq: Every day | ORAL | Status: DC
Start: 2011-08-23 — End: 2011-08-23
  Filled 2011-08-23: qty 1

## 2011-08-23 NOTE — Progress Notes (Signed)
9 Days Post-Op  Subjective: Tolerating diet.  Having loose BMs.  Wants to go home.  Objective: Vital signs in last 24 hours: Temp:  [97.9 F (36.6 C)-98.8 F (37.1 C)] 97.9 F (36.6 C) (02/02 0600) Pulse Rate:  [60-61] 60  (02/02 0600) Resp:  [18-20] 18  (02/02 0600) BP: (120-130)/(65-73) 125/65 mmHg (02/02 0600) SpO2:  [98 %-100 %] 98 % (02/02 0600) Last BM Date: 08/21/11  Intake/Output from previous day: 02/01 0701 - 02/02 0700 In: 2225.8 [P.O.:580; I.V.:1645.8] Out: 1704 [Urine:1700; Stool:4] Intake/Output this shift: Total I/O In: -  Out: 1 [Stool:1]  PE: Abd-soft, incision clean, dry and intact.  Lab Results:  No results found for this basename: WBC:2,HGB:2,HCT:2,PLT:2 in the last 72 hours BMET  Basename 08/22/11 0443 08/21/11 0530  NA 125* 128*  K 4.2 4.5  CL 90* 92*  CO2 28 29  GLUCOSE 112* 115*  BUN 9 9  CREATININE 0.36* 0.33*  CALCIUM 8.7 8.6   PT/INR No results found for this basename: LABPROT:2,INR:2 in the last 72 hours Comprehensive Metabolic Panel:    Component Value Date/Time   NA 125* 08/22/2011 0443   K 4.2 08/22/2011 0443   CL 90* 08/22/2011 0443   CO2 28 08/22/2011 0443   BUN 9 08/22/2011 0443   CREATININE 0.36* 08/22/2011 0443   GLUCOSE 112* 08/22/2011 0443   CALCIUM 8.7 08/22/2011 0443   AST 18 08/21/2011 0530   ALT 13 08/21/2011 0530   ALKPHOS 65 08/21/2011 0530   BILITOT 0.2* 08/21/2011 0530   PROT 5.5* 08/21/2011 0530   ALBUMIN 2.3* 08/21/2011 0530     Studies/Results: No results found.  Anti-infectives: Anti-infectives     Start     Dose/Rate Route Frequency Ordered Stop   08/14/11 2030   cefOXitin (MEFOXIN) 1 g in dextrose 5 % 50 mL IVPB        1 g 100 mL/hr over 30 Minutes Intravenous Every 6 hours 08/14/11 1949 08/15/11 0305   08/14/11 1000   cefOXitin (MEFOXIN) 1 g in dextrose 5 % 50 mL IVPB        1 g 100 mL/hr over 30 Minutes Intravenous 60 min pre-op 08/14/11 0954 08/14/11 1354          Assessment Active Problems:  Post  revision of gastrojejunostomy-doing well.  Hyponatremia    LOS: 9 days   Plan: D/C staples.  Recheck sodium and if improved will discharge.  Instructions given.   Adolph Pollack 08/23/2011

## 2011-08-24 LAB — BASIC METABOLIC PANEL
BUN: 6 mg/dL (ref 6–23)
GFR calc Af Amer: >90 mL/min (ref >90)
GFR calc non Af Amer: >90 mL/min (ref >90)
Potassium: 4.2 mEq/L (ref 3.5–5.1)
Sodium: 122 mEq/L — ABNORMAL LOW (ref 135–145)

## 2011-08-24 NOTE — Plan of Care (Signed)
Problem: Discharge Progression Outcomes Goal: Complications resolved/controlled Outcome: Progressing Na remains low -pt will follow up with primary physician this week

## 2011-08-24 NOTE — Progress Notes (Signed)
10 Days Post-Op  Subjective: Feels good.  No complaints.  Objective: Vital signs in last 24 hours: Temp:  [97.3 F (36.3 C)-98.5 F (36.9 C)] 97.9 F (36.6 C) (02/03 0547) Pulse Rate:  [61-69] 69  (02/03 0547) Resp:  [16-18] 18  (02/03 0547) BP: (139-152)/(70-74) 152/70 mmHg (02/03 0547) SpO2:  [97 %-100 %] 100 % (02/03 0547) Last BM Date: 08/23/11  Intake/Output from previous day: 02/02 0701 - 02/03 0700 In: 1125.8 [P.O.:480; I.V.:645.8] Out: 179 [Urine:177; Stool:2] Intake/Output this shift: Total I/O In: 480 [P.O.:480] Out: -   PE: Abd-soft, incision clean, dry and intact with steri strips.  Lab Results:  No results found for this basename: WBC:2,HGB:2,HCT:2,PLT:2 in the last 72 hours BMET  [facility] 08/24/11 0604 08/23/11 1118  NA 122* 121*  K 4.2 4.6  CL 89* 87*  CO2 27 26  GLUCOSE 81 118*  BUN 6 9  CREATININE 0.45* 0.44*  CALCIUM 8.8 8.8   PT/INR No results found for this basename: LABPROT:2,INR:2 in the last 72 hours Comprehensive Metabolic Panel:    Component Value Date/Time   NA 122* 08/24/2011 0604   K 4.2 08/24/2011 0604   CL 89* 08/24/2011 0604   CO2 27 08/24/2011 0604   BUN 6 08/24/2011 0604   CREATININE 0.45* 08/24/2011 0604   GLUCOSE 81 08/24/2011 0604   CALCIUM 8.8 08/24/2011 0604   AST 18 08/21/2011 0530   ALT 13 08/21/2011 0530   ALKPHOS 65 08/21/2011 0530   BILITOT 0.2* 08/21/2011 0530   PROT 5.5* 08/21/2011 0530   ALBUMIN 2.3* 08/21/2011 0530     Studies/Results: No results found.  Anti-infectives: Anti-infectives     Start     Dose/Rate Route Frequency Ordered Stop   08/14/11 2030   cefOXitin (MEFOXIN) 1 g in dextrose 5 % 50 mL IVPB        1 g 100 mL/hr over 30 Minutes Intravenous Every 6 hours 08/14/11 1949 08/15/11 0305   08/14/11 1000   cefOXitin (MEFOXIN) 1 g in dextrose 5 % 50 mL IVPB        1 g 100 mL/hr over 30 Minutes Intravenous 60 min pre-op 08/14/11 0954 08/14/11 1354          Assessment Active Problems:  Post g-j  revision-doing well  Hyponatremia-acute on chronic:  Slightly improved.    LOS: 10 days   Plan: Discharge.  Instructions given.  Have Na+ checked at PCP's office later this week.     [name] Shela Commons 08/24/2011

## 2011-08-26 ENCOUNTER — Ambulatory Visit (INDEPENDENT_AMBULATORY_CARE_PROVIDER_SITE_OTHER): Payer: BC Managed Care – PPO | Admitting: Internal Medicine

## 2011-08-26 ENCOUNTER — Encounter: Payer: Self-pay | Admitting: Internal Medicine

## 2011-08-26 DIAGNOSIS — R1032 Left lower quadrant pain: Secondary | ICD-10-CM

## 2011-08-26 DIAGNOSIS — D509 Iron deficiency anemia, unspecified: Secondary | ICD-10-CM

## 2011-08-26 DIAGNOSIS — E871 Hypo-osmolality and hyponatremia: Secondary | ICD-10-CM

## 2011-08-26 DIAGNOSIS — K269 Duodenal ulcer, unspecified as acute or chronic, without hemorrhage or perforation: Secondary | ICD-10-CM

## 2011-08-26 DIAGNOSIS — I10 Essential (primary) hypertension: Secondary | ICD-10-CM

## 2011-08-26 DIAGNOSIS — D649 Anemia, unspecified: Secondary | ICD-10-CM

## 2011-08-26 NOTE — Patient Instructions (Signed)
Discontinue lisinopril at this time  Return in 3 weeks for followup

## 2011-08-26 NOTE — Progress Notes (Signed)
  Subjective:    Patient ID: [name], female    DOB: 06-May-1951, 61 y.o.   MRN: [id number]  HPI  Wt Readings from Last 3 Encounters:  08/26/11 86 lb (39.009 kg)  08/15/11 91 lb (41.277 kg)  08/15/11 91 lb (41.18 kg)    61 year old patient who was seen today postoperatively. Hospital records reviewed; she underwent surgery for a partial small bowel obstruction. An ulcer was noted at the prior gastrojejunostomy site. She complains of persistent abdominal pain but in general seems to be doing fairly well. Denies any nausea and vomiting. Her incision is healing nicely She has a history of treated hypertension and remains on lisinopril.Marland Kitchen She was discharged on hydrocodone. Hospital records reviewed    Review of Systems  Constitutional: Positive for activity change and unexpected weight change.  HENT: Negative for hearing loss, congestion, sore throat, rhinorrhea, dental problem, sinus pressure and tinnitus.   Eyes: Negative for pain, discharge and visual disturbance.  Respiratory: Negative for cough and shortness of breath.   Cardiovascular: Negative for chest pain, palpitations and leg swelling.  Gastrointestinal: Positive for abdominal pain. Negative for nausea, vomiting, diarrhea, constipation, blood in stool and abdominal distention.  Genitourinary: Negative for dysuria, urgency, frequency, hematuria, flank pain, vaginal bleeding, vaginal discharge, difficulty urinating, vaginal pain and pelvic pain.  Musculoskeletal: Negative for joint swelling, arthralgias and gait problem.  Skin: Negative for rash.  Neurological: Positive for weakness. Negative for dizziness, syncope, speech difficulty, numbness and headaches.  Hematological: Negative for adenopathy.  Psychiatric/Behavioral: Negative for behavioral problems, dysphoric mood and agitation. The patient is not nervous/anxious.        Objective:   Physical Exam  Constitutional: She is oriented to person, place, and time. She  appears well-developed and well-nourished. No distress.       Blood pressure 90/60 Weight 86 pounds  Malnourished but in no acute distress  HENT:  Head: Normocephalic.  Right Ear: External ear normal.  Left Ear: External ear normal.  Mouth/Throat: Oropharynx is clear and moist.  Eyes: Conjunctivae and EOM are normal. Pupils are equal, round, and reactive to light.  Neck: Normal range of motion. Neck supple. No thyromegaly present.  Cardiovascular: Normal rate, regular rhythm, normal heart sounds and intact distal pulses.   Pulmonary/Chest: Effort normal and breath sounds normal.  Abdominal: Soft. Bowel sounds are normal. She exhibits no mass. There is no tenderness.       Abdominal wall incision healing nicely Steri-Strips were placed  Musculoskeletal: Normal range of motion.  Lymphadenopathy:    She has no cervical adenopathy.  Neurological: She is alert and oriented to person, place, and time.  Skin: Skin is warm and dry. No rash noted.  Psychiatric: She has a normal mood and affect. Her behavior is normal.          Assessment & Plan:   Status post recent surgery for a partial small bowel obstruction as well as gastric ulcer. History of hyponatremia. We'll recheck electrolytes Hypertension. Presently hypotensive we'll discontinue lisinopril and recheck in 3 weeks Abdominal pain. We'll continue hydrocodone

## 2011-08-27 LAB — CBC WITH DIFFERENTIAL/PLATELET
Basophils Absolute: 0.1 10*3/uL (ref 0.0–0.1)
HCT: 30.8 % — ABNORMAL LOW (ref 36.0–46.0)
Lymphs Abs: 1.4 10*3/uL (ref 0.7–4.0)
Monocytes Absolute: 0.6 10*3/uL (ref 0.1–1.0)
Monocytes Relative: 7.3 % (ref 3.0–12.0)
Platelets: 616 10*3/uL — ABNORMAL HIGH (ref 150.0–400.0)
RDW: 12.8 % (ref 11.5–14.6)

## 2011-08-27 LAB — BASIC METABOLIC PANEL
CO2: 24 mEq/L (ref 19–32)
Calcium: 8.6 mg/dL (ref 8.4–10.5)
GFR: 140.46 mL/min (ref >60.00)
Sodium: 129 mEq/L — ABNORMAL LOW (ref 135–145)

## 2011-08-29 NOTE — Discharge Summary (Signed)
NAMEKALIMA, SAYLOR NO.:  [id number]  MEDICAL RECORD NO.:  [id number]  LOCATION:  1522                         FACILITY:  [facility]  PHYSICIAN:  Sandria Bales. Ezzard Standing, M.D.  DATE OF BIRTH:  [birth year]  DATE OF ADMISSION:  08/14/2011 DATE OF DISCHARGE:  08/24/2011                              DISCHARGE SUMMARY  DISCHARGE DIAGNOSES: 1. Marginal ulcer at prior gastrojejunostomy. 2. History of perforated duodenal ulcer in January, 2006 and April,     2006. 3. History of anxiety. 4. Low body weight. 5. MRSA nasal swab positive. 6. Acute withdrawal syndrome secondary to Xanax and possibly alcohol     resolve by the date of discharge. 7. Hyponatremia, which is chronic.  OPERATION PERFORMED:  The patient underwent an exploratory laparotomy, enterolysis of adhesions, revision of gastrojejunostomy, and upper endoscopy by Dr. [name] on August 14, 2011.  HISTORY OF ILLNESS:  Ms. Grefe is a 61 year old white female who is a patient Dr. Eleonore Chiquito.  I took care of her in 2006.  Initially she presented with a perforated ulcer and underwent a pyloroplasty and vagotomy by Dr. Megan Mans on August 03, 2004.  She re-presented in short order in April, 2006 with a more severe perforation, which required a duodenal exclusion, gastrojejunostomy, and a feeding jejunostomy on October 20, 2004 by Dr. [name].  I had not seen her since that time until she presented recently to our office with worsening abdominal pain.  She had undergone an endoscopy by Dr. [name] who indicated she may have an ulcer at her gastrojejunal anastomosis.  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  I discussed her treatment with Dr. [name], but it appeared that really the only option was to re-explore her, to identify the source of abdominal pain, to see the cause for a partial bowel obstruction and to go from there.  We talked  also about some of her substance abuse issues preoperatively. I do not think improving this or correcting this would have changed her acute management.  Therefore, on the day of admission, she was taken to the operating room where she underwent an exploratory laparotomy.  She was found to have an ulcer, which had walled off onto the anterior abdominal wall.  This was the source of her pain.  This ulcer was at the gastrojejunal anastomosis, so I revised her gastrojejunal anastomosis and placed this retrocolic and converted to a Roux-en-Y anastomosis and did an upper endoscopy.  Postoperatively she did well early.  On the 1st postoperative day, her hemoglobin was 11.6, white blood count 10,500.  Sodium was 130.  Her creatinine was 0.45.  Because she came in very thin I went on and placed a PICC line and started her on TPN early after hospitalization.  She was covered with DVT prophylaxis with subcu heparin.  She had also tested MRSA positive preoperatively and she was treated with mupirocin while in the hospital.  By the 3rd postop day, she became very confused, disoriented, combative, and had to be placed in restraints.  It appeared, she was going through withdrawal.  She was  given Haldol, Xanax and Ativan as a Sedative as this controlled her delirium.  At the time, her labs looked good with a white blood count at 9100, hemoglobin 9.6.  Sodium was 124, creatinine 0.37.  For about 48 hours she had trouble with withdrawal symptoms, but these gradually got better.  I removed her NG tube on the 5th postoperative day.  By the 6th postoperative day, she had passed some flatus, but I waited until the 7th postop day to start her on liquids and then slowly advanced her diet.  Her only other issue is her sodium remained below 130 during the entire hospitalization.  It drifted down to 122 by the time of discharge.  Her discharge date was 3 February, 2013.  DISCHARGE INSTRUCTIONS:  To  continue her regular diet as tolerated.   She can shower.  Her incision looks good.   She has pain medicines at home. She will resume her other pain meds.  She is to make an appointment with Dr. Amador Cunas, her primary care doctor, within a week to 10 days for followup of her electrolyte abnormalities.  She is to see me back in the office in 2 weeks.   Her final pathology showed an anastomotic ulcer with chronic inflammation, but no evidence of malignancy.   Her discharge condition was improved.   Sandria Bales. Ezzard Standing, M.D., FACS  DHN/MEDQ  D:  08/29/2011  T:  08/29/2011  Job:  [id number]  cc:   Gordy Savers, MD [address] [place] [address]  Iva Boop, MD,FACG [facility] [address] [place], [place] [address]

## 2011-09-02 ENCOUNTER — Other Ambulatory Visit: Payer: Self-pay | Admitting: Internal Medicine

## 2011-09-03 NOTE — Telephone Encounter (Signed)
ok 

## 2011-09-03 NOTE — Telephone Encounter (Signed)
historical med list only as of 08/11/11 - I dont see where you have every rx'd  Last seen post hospital visit 08/26/11 Please advise  Ok and quantity

## 2011-09-04 NOTE — Telephone Encounter (Signed)
Called in.

## 2011-09-05 ENCOUNTER — Telehealth (INDEPENDENT_AMBULATORY_CARE_PROVIDER_SITE_OTHER): Payer: Self-pay

## 2011-09-05 NOTE — Telephone Encounter (Signed)
Patient called and requested a refill on Hydrocodone.  I called a refill #30 Hydrocodone 5/325 to [facility] [phone] per protocol.

## 2011-09-11 ENCOUNTER — Other Ambulatory Visit (INDEPENDENT_AMBULATORY_CARE_PROVIDER_SITE_OTHER): Payer: Self-pay

## 2011-09-11 DIAGNOSIS — G8918 Other acute postprocedural pain: Secondary | ICD-10-CM

## 2011-09-11 MED ORDER — HYDROCODONE-ACETAMINOPHEN 5-325 MG PO TABS: 1.0000 | ORAL_TABLET | Freq: Four times a day (QID) | ORAL | Status: AC | PRN

## 2011-09-11 NOTE — Telephone Encounter (Signed)
Patient called requesting pain med refill. norco 5/235 #30 no refill called into [facility].

## 2011-09-16 ENCOUNTER — Encounter: Payer: Self-pay | Admitting: Internal Medicine

## 2011-09-16 ENCOUNTER — Ambulatory Visit (INDEPENDENT_AMBULATORY_CARE_PROVIDER_SITE_OTHER): Payer: BC Managed Care – PPO | Admitting: Internal Medicine

## 2011-09-16 DIAGNOSIS — I10 Essential (primary) hypertension: Secondary | ICD-10-CM

## 2011-09-16 DIAGNOSIS — E871 Hypo-osmolality and hyponatremia: Secondary | ICD-10-CM

## 2011-09-16 NOTE — Patient Instructions (Addendum)
Limit your sodium (Salt) intake    It is important that you exercise regularly, at least 20 minutes 3 to 4 times per week.  If you develop chest pain or shortness of breath seek  medical attention.  Please check your blood pressure on a regular basis.  If it is consistently greater than 140/90, please make an office appointment.  Return in 3 months for follow-up

## 2011-09-16 NOTE — Progress Notes (Signed)
  Subjective:    Patient ID: [name], female    DOB: 05-Jun-1951, 61 y.o.   MRN: [id number]  HPI  61 year old patient who is in today for followup of her hypertension. She is now postop and medications were held one month ago due to hypotension. She feels well and is recovering nicely from surgery. There's been some significant weight gain since her last visit here she feels quite well today. She is still having some abdominal pain that seems related to the incision and is clearly improving. She has a history of hyponatremia and followup electrolytes 3 weeks ago were improved    Review of Systems  Constitutional: Negative.   HENT: Negative for hearing loss, congestion, sore throat, rhinorrhea, dental problem, sinus pressure and tinnitus.   Eyes: Negative for pain, discharge and visual disturbance.  Respiratory: Negative for cough and shortness of breath.   Cardiovascular: Negative for chest pain, palpitations and leg swelling.  Gastrointestinal: Positive for abdominal pain. Negative for nausea, vomiting, diarrhea, constipation, blood in stool and abdominal distention.  Genitourinary: Negative for dysuria, urgency, frequency, hematuria, flank pain, vaginal bleeding, vaginal discharge, difficulty urinating, vaginal pain and pelvic pain.  Musculoskeletal: Negative for joint swelling, arthralgias and gait problem.  Skin: Negative for rash.  Neurological: Negative for dizziness, syncope, speech difficulty, weakness, numbness and headaches.  Hematological: Negative for adenopathy.  Psychiatric/Behavioral: Negative for behavioral problems, dysphoric mood and agitation. The patient is not nervous/anxious.        Objective:   Physical Exam  Constitutional: She is oriented to person, place, and time. She appears well-developed and well-nourished.       Blood pressure 110/60  HENT:  Head: Normocephalic.  Right Ear: External ear normal.  Left Ear: External ear normal.  Mouth/Throat: Oropharynx  is clear and moist.  Eyes: Conjunctivae and EOM are normal. Pupils are equal, round, and reactive to light.  Neck: Normal range of motion. Neck supple. No thyromegaly present.  Cardiovascular: Normal rate, regular rhythm, normal heart sounds and intact distal pulses.   Pulmonary/Chest: Effort normal and breath sounds normal.  Abdominal: Soft. Bowel sounds are normal. She exhibits no mass. There is no tenderness.       Incision nicely healed  Musculoskeletal: Normal range of motion.  Lymphadenopathy:    She has no cervical adenopathy.  Neurological: She is alert and oriented to person, place, and time.  Skin: Skin is warm and dry. No rash noted.  Psychiatric: She has a normal mood and affect. Her behavior is normal.          Assessment & Plan:   History of hypertension. We'll continue to hold medication at this time we'll recheck in 3 months we'll continue home blood pressure monitoring Status post surgery doing quite well with modest weight gain Hyponatremia improved

## 2011-09-19 ENCOUNTER — Ambulatory Visit (INDEPENDENT_AMBULATORY_CARE_PROVIDER_SITE_OTHER): Payer: BC Managed Care – PPO | Admitting: Surgery

## 2011-09-19 ENCOUNTER — Encounter (INDEPENDENT_AMBULATORY_CARE_PROVIDER_SITE_OTHER): Payer: Self-pay | Admitting: Surgery

## 2011-09-19 VITALS — BP 124/78 | HR 68 | Temp 97.2°F | Resp 16 | Ht 62.5 in | Wt 90.4 lb

## 2011-09-19 DIAGNOSIS — K289 Gastrojejunal ulcer, unspecified as acute or chronic, without hemorrhage or perforation: Secondary | ICD-10-CM

## 2011-09-19 HISTORY — DX: Gastrojejunal ulcer, unspecified as acute or chronic, without hemorrhage or perforation: K28.9

## 2011-09-19 NOTE — Progress Notes (Signed)
[facility]  [name], MD,  FACS [address]  [address] [place], [place]    [address] Phone:  [phone] FAX:  [phone]   Re:   [name] DOB:   1951/04/09 MRN:   [id number]  ASSESSMENT AND PLAN: 1.  Post op revision of gastrojejunstomy - 08/14/2011.  She says she feels like someone is ripping her abdomen apart, but she looks much better than preop.  I have given her a prescription for Protonix 40 QD to take for 4 to 6 months.  I would consider having Dr. [name] re-endoscope her at that time to make sure the stomach and anastomosis are okay.  Also of note, she had a lot of retained food in her stomach at the time of surgery.  Follow up with me in 4 months.  2. Dr [name] had done a pyloroplasty and vagotomy 08/03/2004 for a perforated ulcer. She represented with a more severe perforated ulcer just a few months later. This required an exploration with duodenal exclusion, gastrojejunostomy, and feeding jejunostomy 10/20/2004 - Dr. Algis Downs. [name].  3. History of anxiety.  4. History of MRSA - right axilla - 2006. 5. Acute withdrawal syndrome secondary to Xanax and possibly alcohol resolve by the date of discharge.  6. Hyponatremia, which is chronic. 7.  Abdominal pain   I gave her Vicodin #40 with one refill.  This should be her last refill of narcotics from [place].  An alternate pain med, such as tramadol, may be the next step.  HISTORY OF PRESENT ILLNESS: Chief Complaint  Patient presents with  . Routine Post Op    Laparotomy 09/11/11    [name] is a 61 y.o. (DOB: Feb 06, 1951)  white female who is a patient of Rogelia Boga, MD, MD and comes to me today for follow up of revision of a gastrojejunostomy.  She is much better than before surgery.  She still has complaints of pain, but I think it her getting over her incisional pain.  She has gained about 5 to 6 pounds.   PHYSICAL EXAM: BP 124/78  Pulse 68  Temp(Src) 97.2 F (36.2 C)  (Temporal)  Resp 16  Ht 5' 2.5" (1.588 m)  Wt 90 lb 6.4 oz (41.005 kg)  BMI 16.27 kg/m2  General: [name] who is alert.  HEENT: Normal. Pupils equal. Neck: Supple. No mass.  No thyroid mass. Lungs: Clear to auscultation and symmetric breath sounds. Heart:  RRR. No murmur or rub.  Abdomen: Soft. No mass. No tenderness. Her incision looks good.  I do not feel a hernia. Rectal: Not done. Extremities:  Good strength and ROM  in upper and lower extremities. Neurologic:  Grossly intact to motor and sensory function. Psychiatric: Has normal mood and affect. Behavior is normal.   DATA REVIEWED: Path report to patient.  [name], MD, FACS Office:  [phone]

## 2011-09-30 ENCOUNTER — Other Ambulatory Visit: Payer: Self-pay | Admitting: Internal Medicine

## 2011-10-29 ENCOUNTER — Other Ambulatory Visit: Payer: Self-pay | Admitting: Internal Medicine

## 2011-11-25 ENCOUNTER — Encounter (INDEPENDENT_AMBULATORY_CARE_PROVIDER_SITE_OTHER): Payer: Self-pay | Admitting: Surgery

## 2011-12-17 ENCOUNTER — Encounter: Payer: Self-pay | Admitting: Internal Medicine

## 2011-12-17 ENCOUNTER — Ambulatory Visit (INDEPENDENT_AMBULATORY_CARE_PROVIDER_SITE_OTHER): Payer: BC Managed Care – PPO | Admitting: Internal Medicine

## 2011-12-17 VITALS — BP 118/68 | Temp 97.8°F | Wt 96.0 lb

## 2011-12-17 DIAGNOSIS — F411 Generalized anxiety disorder: Secondary | ICD-10-CM

## 2011-12-17 DIAGNOSIS — E871 Hypo-osmolality and hyponatremia: Secondary | ICD-10-CM

## 2011-12-17 DIAGNOSIS — R109 Unspecified abdominal pain: Secondary | ICD-10-CM

## 2011-12-17 MED ORDER — ALPRAZOLAM 0.5 MG PO TABS: 0.5000 mg | ORAL_TABLET | Freq: Three times a day (TID) | ORAL | Status: DC | PRN

## 2011-12-17 MED ORDER — LISINOPRIL 20 MG PO TABS: 20.0000 mg | ORAL_TABLET | Freq: Every day | ORAL | Status: DC

## 2011-12-17 NOTE — Patient Instructions (Signed)
Limit your sodium (Salt) intake  Please check your blood pressure on a regular basis.  If it is consistently greater than 150/90, please make an office appointment.  Return in 6 months for follow-up   

## 2011-12-17 NOTE — Progress Notes (Signed)
  Subjective:    Patient ID: [name], female    DOB: 1950-07-28, 61 y.o.   MRN: [id number]  HPI  61 year old patient who is seen today for followup of hypertension. She has a history of chronic abdominal pain which has improved since a gastrojejunostomy revision in late January of this year. Her hypertension has been well-controlled. She has chronic anxiety and does take alprazolam when necessary. She is requesting a refill on her medication. She has a history of a B12 deficiency    Review of Systems  Constitutional: Negative.   HENT: Negative for hearing loss, congestion, sore throat, rhinorrhea, dental problem, sinus pressure and tinnitus.   Eyes: Negative for pain, discharge and visual disturbance.  Respiratory: Negative for cough and shortness of breath.   Cardiovascular: Negative for chest pain, palpitations and leg swelling.  Gastrointestinal: Positive for abdominal pain. Negative for nausea, vomiting, diarrhea, constipation, blood in stool and abdominal distention.  Genitourinary: Negative for dysuria, urgency, frequency, hematuria, flank pain, vaginal bleeding, vaginal discharge, difficulty urinating, vaginal pain and pelvic pain.  Musculoskeletal: Negative for joint swelling, arthralgias and gait problem.  Skin: Negative for rash.  Neurological: Negative for dizziness, syncope, speech difficulty, weakness, numbness and headaches.  Hematological: Negative for adenopathy.  Psychiatric/Behavioral: Negative for behavioral problems, dysphoric mood and agitation. The patient is not nervous/anxious.        Objective:   Physical Exam  Constitutional: She is oriented to person, place, and time. She appears well-developed and well-nourished. No distress.       Thin; Blood pressure well controlled  HENT:  Head: Normocephalic.  Right Ear: External ear normal.  Left Ear: External ear normal.  Mouth/Throat: Oropharynx is clear and moist.  Eyes: Conjunctivae and EOM are normal. Pupils  are equal, round, and reactive to light.  Neck: Normal range of motion. Neck supple. No thyromegaly present.  Cardiovascular: Normal rate, regular rhythm, normal heart sounds and intact distal pulses.   Pulmonary/Chest: Effort normal and breath sounds normal.  Abdominal: Soft. Bowel sounds are normal. She exhibits no distension and no mass. There is no tenderness. There is no rebound and no guarding.       Nicely healing midline scar. No tenderness or distention  Musculoskeletal: Normal range of motion.  Lymphadenopathy:    She has no cervical adenopathy.  Neurological: She is alert and oriented to person, place, and time.  Skin: Skin is warm and dry. No rash noted.  Psychiatric: She has a normal mood and affect. Her behavior is normal.          Assessment & Plan:   Hypertension well controlled Anxiety disorder Status post revision gastrojejunostomy  Recheck 6 months

## 2011-12-17 NOTE — Progress Notes (Signed)
  Subjective:    Patient ID: [name], female    DOB: 1951-05-07, 61 y.o.   MRN: [id number]  HPI  Wt Readings from Last 3 Encounters:  12/17/11 96 lb (43.545 kg)  09/19/11 90 lb 6.4 oz (41.005 kg)  09/16/11 91 lb (41.277 kg)    Review of Systems     Objective:   Physical Exam        Assessment & Plan:

## 2012-04-15 ENCOUNTER — Encounter: Payer: Self-pay | Admitting: Internal Medicine

## 2012-04-15 ENCOUNTER — Ambulatory Visit (INDEPENDENT_AMBULATORY_CARE_PROVIDER_SITE_OTHER): Payer: BC Managed Care – PPO | Admitting: Internal Medicine

## 2012-04-15 VITALS — BP 130/72 | Temp 98.0°F | Wt 101.0 lb

## 2012-04-15 DIAGNOSIS — Z23 Encounter for immunization: Secondary | ICD-10-CM

## 2012-04-15 DIAGNOSIS — L290 Pruritus ani: Secondary | ICD-10-CM

## 2012-04-15 DIAGNOSIS — I10 Essential (primary) hypertension: Secondary | ICD-10-CM

## 2012-04-15 DIAGNOSIS — F411 Generalized anxiety disorder: Secondary | ICD-10-CM

## 2012-04-15 DIAGNOSIS — E538 Deficiency of other specified B group vitamins: Secondary | ICD-10-CM

## 2012-04-15 MED ORDER — CYANOCOBALAMIN 1000 MCG/ML IJ SOLN
1000.0000 ug | Freq: Once | INTRAMUSCULAR | Status: AC
  Administered 2012-04-15: 1000 ug via INTRAMUSCULAR

## 2012-04-15 MED ORDER — ALPRAZOLAM 0.5 MG PO TABS: 0.5000 mg | ORAL_TABLET | Freq: Three times a day (TID) | ORAL | Status: DC | PRN

## 2012-04-15 MED ORDER — HYDROCORTISONE ACETATE 25 MG RE SUPP: 25.0000 mg | Freq: Two times a day (BID) | RECTAL | Status: DC

## 2012-04-15 NOTE — Addendum Note (Signed)
Addended by: Duard Brady I on: 04/15/2012 05:12 PM   Modules accepted: Orders

## 2012-04-15 NOTE — Progress Notes (Signed)
Subjective:    Patient ID: [name], female    DOB: 02-Feb-1951, 61 y.o.   MRN: [id number]  HPI  61 year old patient who is seen today with a chief complaint of a perirectal dermatitis She has a history of hypertension which has been controlled on lisinopril. She has been compliant with her medications She has a history of alcoholism and anxiety disorder and requests a refill on alprazolam. Has not been seen here in about 60 months. She has a history of B12 deficiency and receives parenteral B12 in frequently. She does have oral B12 which he takes sporadically compliance issues were addressed.  Past Medical History  Diagnosis Date  . Alcoholism 02/02/2009  . ANXIETY 01/21/2007  . DIVERTICULOSIS 07/09/2007  . Duodenal ulcer hemorrhage perforated 2006  . HYPERTENSION 06/23/2007  . HYPONATREMIA 02/02/2009    better off diuretic  . INSOMNIA 05/31/2007  . OSTEOPOROSIS 01/21/2007  . Raynaud's syndrome 06/23/2007  . VITAMIN B12 DEFICIENCY 10/02/2009  . Zoster 2012    twice - left groin/vagina  . NSAID-induced duodenal ulcer   . Blood transfusion     hx of 2005   . Headache 05/31/2007    pt denies at 08/12/11 preop visit   . Iron deficiency anemia     pt denies at 08/12/11 visit   . Recurrent upper respiratory infection (URI)     pt reports slight cold using Nyquil prn at bedtime     History   Social History  . Marital Status: Married    Spouse Name: N/A    Number of Children: 1  . Years of Education: N/A   Occupational History  . FT student    Social History Main Topics  . Smoking status: Never Smoker   . Smokeless tobacco: Never Used  . Alcohol Use: No  . Drug Use: No  . Sexually Active: Not on file   Other Topics Concern  . Not on file   Social History Narrative  . No narrative on file    Past Surgical History  Procedure Date  . Tubal ligation 1977  . Upper gastrointestinal endoscopy 09/16/2007; 12/21/2008; 03/17/2011    2009: Normal 2010: Normal with enteroscopy2012:  Gastrojejunal  anastomotic ulcer, gastric retention  . Colonoscopy 06/18/2007    angulated and stenotic sigmoid colon with severe sigmoid diverticulosis  . Exploratory laparotomy 08/03/2004    1) duodenal ulcer oversew, pyloroplasty, anterior, posterior and truncal vagotomies  . Exploratory laparotomy 10/20/2004    1) duodenal ulcer oversew  with exclusion2) side-side gastrojejunostomy 3) feeding jejunostomy  . Laparotomy 08/14/2011    Procedure: EXPLORATORY LAPAROTOMY;  Surgeon: Kandis Cocking, MD;  Location: [facility];  Service: General;  Laterality: N/A;  exploratory laparotomy, lysis of adhesions, revision gastrojejunostomy, upper endoscopy  . Laparoscopy 08/14/2011    Procedure: LAPAROSCOPY DIAGNOSTIC;  Surgeon: Kandis Cocking, MD;  Location: [facility];  Service: General;  Laterality: N/A;    Family History  Problem Relation Age of Onset  . Stroke Father     Allergies  Allergen Reactions  . Sulfonamide Derivatives Anaphylaxis and Rash  . Penicillins     REACTION: unspecified  . Hydromorphone Hcl Rash    Current Outpatient Prescriptions on File Prior to Visit  Medication Sig Dispense Refill  . lisinopril (PRINIVIL,ZESTRIL) 20 MG tablet Take 1 tablet (20 mg total) by mouth daily with breakfast.  90 tablet  4    BP 130/72  Temp 98 F (36.7 C) (Oral)  Wt 101 lb (45.813 kg)  Review of Systems  Constitutional: Negative.   HENT: Negative for hearing loss, congestion, sore throat, rhinorrhea, dental problem, sinus pressure and tinnitus.   Eyes: Negative for pain, discharge and visual disturbance.  Respiratory: Negative for cough and shortness of breath.   Cardiovascular: Negative for chest pain, palpitations and leg swelling.  Gastrointestinal: Negative for nausea, vomiting, abdominal pain, diarrhea, constipation, blood in stool and abdominal distention.  Genitourinary: Negative for dysuria, urgency, frequency, hematuria, flank pain, vaginal bleeding, vaginal discharge,  difficulty urinating, vaginal pain and pelvic pain.  Musculoskeletal: Negative for joint swelling, arthralgias and gait problem.  Skin: Positive for rash.  Neurological: Negative for dizziness, syncope, speech difficulty, weakness, numbness and headaches.  Hematological: Negative for adenopathy.  Psychiatric/Behavioral: Negative for behavioral problems, dysphoric mood and agitation. The patient is nervous/anxious.        Objective:   Physical Exam  Constitutional: She is oriented to person, place, and time. She appears well-developed and well-nourished.  HENT:  Head: Normocephalic.  Right Ear: External ear normal.  Left Ear: External ear normal.  Mouth/Throat: Oropharynx is clear and moist.  Eyes: Conjunctivae normal and EOM are normal. Pupils are equal, round, and reactive to light.  Neck: Normal range of motion. Neck supple. No thyromegaly present.  Cardiovascular: Normal rate, regular rhythm, normal heart sounds and intact distal pulses.   Pulmonary/Chest: Effort normal and breath sounds normal.  Abdominal: Soft. Bowel sounds are normal. She exhibits no mass. There is no tenderness.  Musculoskeletal: Normal range of motion.  Lymphadenopathy:    She has no cervical adenopathy.  Neurological: She is alert and oriented to person, place, and time.  Skin: Skin is warm and dry. Rash noted.       Perianal erythema  Psychiatric: She has a normal mood and affect. Her behavior is normal.          Assessment & Plan:  Pruritus ani-  local skin care discussed information dispensed B12 deficiency. We'll give a B12 injection today appliance with daily oral B12 supplements encouraged Hypertension well controlled Anxiety disorder. Alprazolam refilled  Schedule CPX in 6 months

## 2012-04-15 NOTE — Patient Instructions (Addendum)
Sitz Bath A sitz bath is a warm water bath taken in the sitting position. The water covers only the hips and butt (buttocks). It may be used for either healing or cleaning purposes. Sitz baths are also used to relieve pain, itching, or muscle tightening (spasms). The water may contain medicine. Moist heat will help you heal and relax.   HOME CARE    Fill the bathtub half-full with warm water.   Sit in the water and open the drain a little.   Turn on the warm water to keep the tub half-full. Keep the water running constantly.   Soak in the water for 15 to 20 minutes.   After the sitz bath, pat the affected area dry.   Take 3 to 4 sitz baths a day.  GET HELP RIGHT AWAY IF: You get worse instead of better. Stop the sitz baths if you get worse. MAKE SURE YOU:  Understand these instructions.   Will watch your condition.   Will get help right away if you are not doing well or get worse.  Document Released: 08/14/2004 Document Revised: 06/26/2011 Document Reviewed: 11/04/2010 [facility] Patient Information 2012 [place], [place].Anal Pruritus Anal pruritus is an itching of the anus, which is often due to increased moisture of the skin around the anus. Moisture may be due to sweating or a small amount of remaining stool. The itching and scratching can cause further skin damage.   CAUSES    Poor hygiene.   Excessive moisture from sweating or residual stool in the anal area.   Perfumed soaps and sprays and colored toilet paper.   Chemicals in the foods you eat.   Dietary factors such as caffeine, beer, milk products, chocolate, nuts, citrus fruits, tomatoes, spicy seasonings, jalapeno peppers, and salsa.   Hemorrhoids, infections, and other anal diseases.   Excessive washing.   Overuse of laxatives.   Skin disorders (psoriasis, eczema, or seborrhea).  HOME CARE INSTRUCTIONS    Practice good hygiene.   Clean the anal area gently with wet toilet paper, baby wipes, or a wet washcloth  after every bowel movement and at bedtime. Avoid using soaps on the anal area. Dry the area thoroughly. Pat the area dry with toilet paper or a towel.   Do not scrub the anal area with anything, even toilet paper.   Try not to scratch the itchy area. Scratching produces more damage, which makes the itching worse.   Take sitz baths in warm water for 15 to 20 minutes, 2 to 3 times a day. Pat the area dry with a soft cloth after each bath.   Zinc oxide ointment or a moisture barrier cream can be applied several times daily to protect the skin.   Only take medicines as directed by your caregiver.   Talk to your caregiver about fiber supplements. These are helpful in normalizing the stool if you have frequent loose stools.   Wear cotton underwear and loose clothing.   Do not use irritants such as bubble baths, scented toilet paper, or genital deodorants.  SEEK MEDICAL CARE IF:    Itching does not improve in several days or gets worse.   You have a fever.   There are problems with increased pain, swelling, or redness.  MAKE SURE YOU:    Understand these instructions.   Will watch your condition.   Will get help right away if you are not doing well or get worse.  Document Released: 01/06/2011 Document Revised: 06/26/2011 Document Reviewed: 01/06/2011 [employer]  Patient Information 2012 [employer]. 

## 2012-04-20 ENCOUNTER — Telehealth: Payer: Self-pay | Admitting: Internal Medicine

## 2012-04-20 NOTE — Telephone Encounter (Signed)
Caller: Leonia/Patient; Phone: [phone]; Reason for Call: Patient request to speak with Dr.  Vernon Prey nurse regarding left side pain.  States it started after she had the analpruritis done.  Please call her back.  Thanks

## 2012-04-20 NOTE — Telephone Encounter (Signed)
Spoke with pt- pain she is discussing sound like a possible pulled muscle in (L) flank - no fall no injury that she can remember - no chest pain, no fever or nausea - ok to take aleve bid and use cold packs tid for 24-48hrs -if no improvement - need to see.

## 2012-05-13 ENCOUNTER — Telehealth: Payer: Self-pay | Admitting: Internal Medicine

## 2012-05-13 ENCOUNTER — Ambulatory Visit (INDEPENDENT_AMBULATORY_CARE_PROVIDER_SITE_OTHER): Payer: BC Managed Care – PPO

## 2012-05-13 DIAGNOSIS — E538 Deficiency of other specified B group vitamins: Secondary | ICD-10-CM

## 2012-05-13 MED ORDER — CYANOCOBALAMIN 1000 MCG/ML IJ SOLN
1000.0000 ug | Freq: Once | INTRAMUSCULAR | Status: AC
  Administered 2012-05-13: 1000 ug via INTRAMUSCULAR

## 2012-05-13 MED ORDER — OMEPRAZOLE 20 MG PO CPDR: 20.0000 mg | DELAYED_RELEASE_CAPSULE | Freq: Every day | ORAL | Status: DC

## 2012-05-13 NOTE — Telephone Encounter (Signed)
Caller: [name]/Patient; Patient Name: [name]; PCP: Eleonore Chiquito [facility]); Best Callback Phone Number: [phone].  Call regarding script for Omeprazole. Pharmacist informed pt she may get medication for less with a script vs buy OTC. All emergent symtpoms ruled out per Heartburn protocol with exception to 'Three or more episodes of indigestion per week for two weeks or more and symptoms not relieved with home care'.  See Provider in 72 hrs. Offered appt, pt declined and ended the call politely. Rn didn't have the opportunity to review home care advice.

## 2012-05-13 NOTE — Telephone Encounter (Signed)
Caller: Yuriana/Patient; Patient Name: [name]; PCP: Eleonore Chiquito [facility]); Best Callback Phone Number: [phone]; Reason for call:  Pt.  was given samples of omeprazole, and is asking if it would be cheaper to buy OTC, or to get a prescription.  Pt. was directed to the Pharmacy Help Desk # on the back of her [employer] card, and instructed to call them to see what her copay would be for omeprazole.  Pt. will call back if she needs a prescription called in.  CAN/db

## 2012-05-13 NOTE — Telephone Encounter (Signed)
noted 

## 2012-05-13 NOTE — Telephone Encounter (Signed)
rx sent into pharmacy

## 2012-05-20 ENCOUNTER — Encounter: Payer: Self-pay | Admitting: Internal Medicine

## 2012-06-21 ENCOUNTER — Ambulatory Visit: Payer: BC Managed Care – PPO | Admitting: Internal Medicine

## 2012-09-03 ENCOUNTER — Other Ambulatory Visit: Payer: Self-pay | Admitting: Internal Medicine

## 2012-09-09 ENCOUNTER — Ambulatory Visit (INDEPENDENT_AMBULATORY_CARE_PROVIDER_SITE_OTHER): Payer: BC Managed Care – PPO | Admitting: Internal Medicine

## 2012-09-09 ENCOUNTER — Encounter: Payer: Self-pay | Admitting: Internal Medicine

## 2012-09-09 VITALS — BP 110/60 | HR 83 | Temp 98.6°F | Resp 18 | Wt 104.0 lb

## 2012-09-09 DIAGNOSIS — I10 Essential (primary) hypertension: Secondary | ICD-10-CM

## 2012-09-09 DIAGNOSIS — F411 Generalized anxiety disorder: Secondary | ICD-10-CM

## 2012-09-09 MED ORDER — LISINOPRIL 20 MG PO TABS: 20.0000 mg | ORAL_TABLET | Freq: Every day | ORAL | Status: DC

## 2012-09-09 NOTE — Progress Notes (Signed)
Subjective:    Patient ID: [name], female    DOB: [birth year], 62 y.o.   MRN: [id number]  HPI  62 year old patient who is seen today for followup. She has treated hypertension. She has a history of prior gastrojejunostomy status post resection in January of last year. She has history of B12 deficiency. She is doing quite well and there has been some modest weight gain since her last visit here she generally feels quite well today.  Wt Readings from Last 3 Encounters:  09/09/12 104 lb (47.174 kg)  04/15/12 101 lb (45.813 kg)  12/17/11 96 lb (43.545 kg)     Past Medical History  Diagnosis Date  . Alcoholism 02/02/2009  . ANXIETY 01/21/2007  . DIVERTICULOSIS 07/09/2007  . Duodenal ulcer hemorrhage perforated 2006  . HYPERTENSION 06/23/2007  . HYPONATREMIA 02/02/2009    better off diuretic  . INSOMNIA 05/31/2007  . OSTEOPOROSIS 01/21/2007  . Raynaud's syndrome 06/23/2007  . VITAMIN B12 DEFICIENCY 10/02/2009  . Zoster 2012    twice - left groin/vagina  . NSAID-induced duodenal ulcer   . Blood transfusion     hx of 2005   . Headache 05/31/2007    pt denies at 08/12/11 preop visit   . Iron deficiency anemia     pt denies at 08/12/11 visit   . Recurrent upper respiratory infection (URI)     pt reports slight cold using Nyquil prn at bedtime     History   Social History  . Marital Status: Married    Spouse Name: N/A    Number of Children: 1  . Years of Education: N/A   Occupational History  . FT student    Social History Main Topics  . Smoking status: Never Smoker   . Smokeless tobacco: Never Used  . Alcohol Use: No  . Drug Use: No  . Sexually Active: Not on file   Other Topics Concern  . Not on file   Social History Narrative  . No narrative on file    Past Surgical History  Procedure Laterality Date  . Tubal ligation  1977  . Upper gastrointestinal endoscopy  09/16/2007; 12/21/2008; 03/17/2011    2009: Normal 2010: Normal with enteroscopy2012: Gastrojejunal   anastomotic ulcer, gastric retention  . Colonoscopy  06/18/2007    angulated and stenotic sigmoid colon with severe sigmoid diverticulosis  . Exploratory laparotomy  08/03/2004    1) duodenal ulcer oversew, pyloroplasty, anterior, posterior and truncal vagotomies  . Exploratory laparotomy  10/20/2004    1) duodenal ulcer oversew  with exclusion2) side-side gastrojejunostomy 3) feeding jejunostomy  . Laparotomy  08/14/2011    Procedure: EXPLORATORY LAPAROTOMY;  Surgeon: Kandis Cocking, MD;  Location: [facility];  Service: General;  Laterality: N/A;  exploratory laparotomy, lysis of adhesions, revision gastrojejunostomy, upper endoscopy  . Laparoscopy  08/14/2011    Procedure: LAPAROSCOPY DIAGNOSTIC;  Surgeon: Kandis Cocking, MD;  Location: [facility];  Service: General;  Laterality: N/A;    Family History  Problem Relation Age of Onset  . Stroke Father     Allergies  Allergen Reactions  . Sulfonamide Derivatives Anaphylaxis and Rash  . Penicillins     REACTION: unspecified  . Hydromorphone Hcl Rash    Current Outpatient Prescriptions on File Prior to Visit  Medication Sig Dispense Refill  . ALPRAZolam (XANAX) 0.5 MG tablet take 1 tablet by mouth three times a day if needed for sleep  60 tablet  1  . hydrocortisone (ANUSOL-HC) 25 MG suppository Place  1 suppository (25 mg total) rectally 2 (two) times daily.  12 suppository  0   No current facility-administered medications on file prior to visit.    BP 110/60  Pulse 83  Temp(Src) 98.6 F (37 C) (Oral)  Resp 18  Wt 104 lb (47.174 kg)  BMI 18.71 kg/m2  SpO2 98%    Review of Systems  Constitutional: Negative.   HENT: Negative for hearing loss, congestion, sore throat, rhinorrhea, dental problem, sinus pressure and tinnitus.   Eyes: Negative for pain, discharge and visual disturbance.  Respiratory: Negative for cough and shortness of breath.   Cardiovascular: Negative for chest pain, palpitations and leg swelling.  Gastrointestinal:  Negative for nausea, vomiting, abdominal pain, diarrhea, constipation, blood in stool and abdominal distention.  Genitourinary: Negative for dysuria, urgency, frequency, hematuria, flank pain, vaginal bleeding, vaginal discharge, difficulty urinating, vaginal pain and pelvic pain.  Musculoskeletal: Negative for joint swelling, arthralgias and gait problem.  Skin: Negative for rash.  Neurological: Negative for dizziness, syncope, speech difficulty, weakness, numbness and headaches.  Hematological: Negative for adenopathy.  Psychiatric/Behavioral: Negative for behavioral problems, dysphoric mood and agitation. The patient is not nervous/anxious.        Objective:   Physical Exam  Constitutional: She is oriented to person, place, and time. She appears well-developed and well-nourished.  HENT:  Head: Normocephalic.  Right Ear: External ear normal.  Left Ear: External ear normal.  Mouth/Throat: Oropharynx is clear and moist.  Eyes: Conjunctivae and EOM are normal. Pupils are equal, round, and reactive to light.  Neck: Normal range of motion. Neck supple. No thyromegaly present.  Cardiovascular: Normal rate, regular rhythm, normal heart sounds and intact distal pulses.   Pulmonary/Chest: Effort normal and breath sounds normal.  Abdominal: Soft. Bowel sounds are normal. She exhibits no mass. There is no tenderness.  Musculoskeletal: Normal range of motion.  Lymphadenopathy:    She has no cervical adenopathy.  Neurological: She is alert and oriented to person, place, and time.  Skin: Skin is warm and dry. No rash noted.  Psychiatric: She has a normal mood and affect. Her behavior is normal.          Assessment & Plan:   Hypertension well controlled Anxiety disorder stable  Medications refilled CPX in 6 months

## 2012-09-09 NOTE — Patient Instructions (Addendum)
Limit your sodium (Salt) intake    It is important that you exercise regularly, at least 20 minutes 3 to 4 times per week.  If you develop chest pain or shortness of breath seek  medical attention.  Please check your blood pressure on a regular basis.  If it is consistently greater than 150/90, please make an office appointment.  Return in 6 months for follow-up   

## 2012-10-30 ENCOUNTER — Other Ambulatory Visit: Payer: Self-pay | Admitting: Internal Medicine

## 2012-12-30 ENCOUNTER — Other Ambulatory Visit: Payer: Self-pay | Admitting: Internal Medicine

## 2013-02-28 ENCOUNTER — Encounter: Payer: Self-pay | Admitting: Internal Medicine

## 2013-02-28 ENCOUNTER — Ambulatory Visit (INDEPENDENT_AMBULATORY_CARE_PROVIDER_SITE_OTHER): Payer: BC Managed Care – PPO | Admitting: Internal Medicine

## 2013-02-28 VITALS — BP 140/80 | HR 81 | Temp 98.3°F | Ht 61.0 in | Wt 105.0 lb

## 2013-02-28 DIAGNOSIS — B029 Zoster without complications: Secondary | ICD-10-CM

## 2013-02-28 MED ORDER — ACYCLOVIR 5 % EX CREA: 1.0000 "application " | TOPICAL_CREAM | CUTANEOUS | Status: DC

## 2013-02-28 NOTE — Progress Notes (Signed)
Subjective:    Patient ID: [name], female    DOB: 05-27-1951, 62 y.o.   MRN: [id number]  HPI  Pt presents to the clinic today with c/o a rash on her left labia. This started 2 days ago. It does not burn or itch. She has had a rash like this in the past when she has had shingles. The rash is in the same place. She has not put anything on it. She has not had any new sexual partners. She denies vaginal/urinary complaints.  Review of Systems      Past Medical History  Diagnosis Date  . Alcoholism 02/02/2009  . ANXIETY 01/21/2007  . DIVERTICULOSIS 07/09/2007  . Duodenal ulcer hemorrhage perforated 2006  . HYPERTENSION 06/23/2007  . HYPONATREMIA 02/02/2009    better off diuretic  . INSOMNIA 05/31/2007  . OSTEOPOROSIS 01/21/2007  . Raynaud's syndrome 06/23/2007  . VITAMIN B12 DEFICIENCY 10/02/2009  . Zoster 2012    twice - left groin/vagina  . NSAID-induced duodenal ulcer   . Blood transfusion     hx of 2005   . Headache(784.0) 05/31/2007    pt denies at 08/12/11 preop visit   . Iron deficiency anemia     pt denies at 08/12/11 visit   . Recurrent upper respiratory infection (URI)     pt reports slight cold using Nyquil prn at bedtime     Current Outpatient Prescriptions  Medication Sig Dispense Refill  . ALPRAZolam (XANAX) 0.5 MG tablet take 1 tablet by mouth three times a day if needed  60 tablet  1  . lisinopril (PRINIVIL,ZESTRIL) 20 MG tablet Take 1 tablet (20 mg total) by mouth daily with breakfast.  90 tablet  4  . acyclovir cream (ZOVIRAX) 5 % Apply 1 application topically every 3 (three) hours.  15 g  0  . alum & mag hydroxide-simeth (MAALOX/MYLANTA) 200-200-20 MG/5ML suspension Take 5 mLs by mouth every 6 (six) hours as needed for indigestion.      . hydrocortisone (ANUSOL-HC) 25 MG suppository Place 1 suppository (25 mg total) rectally 2 (two) times daily.  12 suppository  0   No current facility-administered medications for this visit.    Allergies  Allergen Reactions   . Sulfonamide Derivatives Anaphylaxis and Rash  . Penicillins     REACTION: unspecified  . Hydromorphone Hcl Rash    Family History  Problem Relation Age of Onset  . Stroke Father     History   Social History  . Marital Status: Married    Spouse Name: N/A    Number of Children: 1  . Years of Education: N/A   Occupational History  . FT student    Social History Main Topics  . Smoking status: Never Smoker   . Smokeless tobacco: Never Used  . Alcohol Use: No  . Drug Use: No  . Sexually Active: Not on file   Other Topics Concern  . Not on file   Social History Narrative  . No narrative on file     Constitutional: Denies fever, malaise, fatigue, headache or abrupt weight changes.   GU: Denies urgency, frequency, pain with urination, burning sensation, blood in urine, odor or discharge.  Skin: Pt reports rash on left labia.    No other specific complaints in a complete review of systems (except as listed in HPI above).  Objective:   Physical Exam   BP 140/80  Pulse 81  Temp(Src) 98.3 F (36.8 C) (Oral)  Ht 5\' 1"  (1.549 m)  Wt 105 lb (47.628 kg)  BMI 19.85 kg/m2  SpO2 96% Wt Readings from Last 3 Encounters:  02/28/13 105 lb (47.628 kg)  09/09/12 104 lb (47.174 kg)  04/15/12 101 lb (45.813 kg)    General: Appears their stated age, well developed, well nourished in NAD. Skin: Warm, dry and intact. Multiple vesicular lesions on erythematous base noted. Cardiovascular: Normal rate and rhythm. S1,S2 noted.  No murmur, rubs or gallops noted. No JVD or BLE edema. No carotid bruits noted. Pulmonary/Chest: Normal effort and positive vesicular breath sounds. No respiratory distress. No wheezes, rales or ronchi noted.  Abdomen: Soft and nontender. Normal bowel sounds, no bruits noted. No distention or masses noted. Liver, spleen and kidneys non palpable.   BMET    Component Value Date/Time   NA 129* 08/26/2011 1700   K 4.7 08/26/2011 1700   CL 96 08/26/2011 1700    CO2 24 08/26/2011 1700   GLUCOSE 86 08/26/2011 1700   BUN 16 08/26/2011 1700   CREATININE 0.5 08/26/2011 1700   CALCIUM 8.6 08/26/2011 1700   GFRNONAA >90 08/24/2011 0604   GFRAA >90 08/24/2011 0604    Lipid Panel     Component Value Date/Time   CHOL 162 08/18/2011 0640   TRIG 43 08/18/2011 0640   HDL 88.8 11/27/2006 1040   CHOLHDL 2.2 CALC 11/27/2006 1040   VLDL 10 11/27/2006 1040   LDLCALC 93 11/27/2006 1040    CBC    Component Value Date/Time   WBC 8.8 08/26/2011 1700   RBC 3.02* 08/26/2011 1700   HGB 10.5* 08/26/2011 1700   HCT 30.8* 08/26/2011 1700   PLT 616.0* 08/26/2011 1700   MCV 102.1* 08/26/2011 1700   MCH 33.8 08/18/2011 0640   MCHC 34.1 08/26/2011 1700   RDW 12.8 08/26/2011 1700   LYMPHSABS 1.4 08/26/2011 1700   MONOABS 0.6 08/26/2011 1700   EOSABS 0.1 08/26/2011 1700   BASOSABS 0.1 08/26/2011 1700    Hgb A1C No results found for this basename: HGBA1C        Assessment & Plan:   Shingles of left labia, new onset:  Not a bad breakout Will try acyclovir cream five times daily for 10 days If worse, give me a call back by Wednesday  RTC as needed

## 2013-02-28 NOTE — Patient Instructions (Signed)
Shingles Shingles (herpes zoster) is an infection that is caused by the same virus that causes chickenpox (varicella). The infection causes a painful skin rash and fluid-filled blisters, which eventually break open, crust over, and heal. It may occur in any area of the body, but it usually affects only one side of the body or face. The pain of shingles usually lasts about 1 month. However, some people with shingles may develop long-term (chronic) pain in the affected area of the body. Shingles often occurs many years after the person had chickenpox. It is more common:  In people older than 50 years.  In people with weakened immune systems, such as those with HIV, AIDS, or cancer.  In people taking medicines that weaken the immune system, such as transplant medicines.  In people under great stress. CAUSES  Shingles is caused by the varicella zoster virus (VZV), which also causes chickenpox. After a person is infected with the virus, it can remain in the person's body for years in an inactive state (dormant). To cause shingles, the virus reactivates and breaks out as an infection in a nerve root. The virus can be spread from person to person (contagious) through contact with open blisters of the shingles rash. It will only spread to people who have not had chickenpox. When these people are exposed to the virus, they may develop chickenpox. They will not develop shingles. Once the blisters scab over, the person is no longer contagious and cannot spread the virus to others. SYMPTOMS  Shingles shows up in stages. The initial symptoms may be pain, itching, and tingling in an area of the skin. This pain is usually described as burning, stabbing, or throbbing.In a few days or weeks, a painful red rash will appear in the area where the pain, itching, and tingling were felt. The rash is usually on one side of the body in a band or belt-like pattern. Then, the rash usually turns into fluid-filled blisters. They  will scab over and dry up in approximately 2 3 weeks. Flu-like symptoms may also occur with the initial symptoms, the rash, or the blisters. These may include:  Fever.  Chills.  Headache.  Upset stomach. DIAGNOSIS  Your caregiver will perform a skin exam to diagnose shingles. Skin scrapings or fluid samples may also be taken from the blisters. This sample will be examined under a microscope or sent to a lab for further testing. TREATMENT  There is no specific cure for shingles. Your caregiver will likely prescribe medicines to help you manage the pain, recover faster, and avoid long-term problems. This may include antiviral drugs, anti-inflammatory drugs, and pain medicines. HOME CARE INSTRUCTIONS   Take a cool bath or apply cool compresses to the area of the rash or blisters as directed. This may help with the pain and itching.   Only take over-the-counter or prescription medicines as directed by your caregiver.   Rest as directed by your caregiver.  Keep your rash and blisters clean with mild soap and cool water or as directed by your caregiver.  Do not pick your blisters or scratch your rash. Apply an anti-itch cream or numbing creams to the affected area as directed by your caregiver.  Keep your shingles rash covered with a loose bandage (dressing).  Avoid skin contact with:  Babies.   Pregnant women.   Children with eczema.   Elderly people with transplants.   People with chronic illnesses, such as leukemia or AIDS.   Wear loose-fitting clothing to help ease   the pain of material rubbing against the rash.  Keep all follow-up appointments with your caregiver.If the area involved is on your face, you may receive a referral for follow-up to a specialist, such as an eye doctor (ophthalmologist) or an ear, nose, and throat (ENT) doctor. Keeping all follow-up appointments will help you avoid eye complications, chronic pain, or disability.  SEEK IMMEDIATE MEDICAL  CARE IF:   You have facial pain, pain around the eye area, or loss of feeling on one side of your face.  You have ear pain or ringing in your ear.  You have loss of taste.  Your pain is not relieved with prescribed medicines.   Your redness or swelling spreads.   You have more pain and swelling.  Your condition is worsening or has changed.   You have a feveror persistent symptoms for more than 2 3 days.  You have a fever and your symptoms suddenly get worse. MAKE SURE YOU:  Understand these instructions.  Will watch your condition.  Will get help right away if you are not doing well or get worse. Document Released: 07/07/2005 Document Revised: 03/31/2012 Document Reviewed: 02/19/2012 [employer] Patient Information 2014 [employer].  

## 2013-03-02 ENCOUNTER — Other Ambulatory Visit: Payer: Self-pay | Admitting: Internal Medicine

## 2013-03-10 ENCOUNTER — Encounter: Payer: Self-pay | Admitting: Internal Medicine

## 2013-03-10 ENCOUNTER — Ambulatory Visit (INDEPENDENT_AMBULATORY_CARE_PROVIDER_SITE_OTHER): Payer: BC Managed Care – PPO | Admitting: Internal Medicine

## 2013-03-10 VITALS — BP 130/80 | HR 101 | Temp 98.4°F | Wt 107.0 lb

## 2013-03-10 DIAGNOSIS — F411 Generalized anxiety disorder: Secondary | ICD-10-CM

## 2013-03-10 DIAGNOSIS — I10 Essential (primary) hypertension: Secondary | ICD-10-CM

## 2013-03-10 NOTE — Progress Notes (Signed)
  Subjective:    Patient ID: [name], female    DOB: 19-Aug-1950, 62 y.o.   MRN: [id number]  HPI  Wt Readings from Last 3 Encounters:  03/10/13 107 lb (48.535 kg)  02/28/13 105 lb (47.628 kg)  09/09/12 104 lb (47.174 kg)    Review of Systems     Objective:   Physical Exam        Assessment & Plan:

## 2013-03-10 NOTE — Progress Notes (Signed)
Subjective:    Patient ID: [name], female    DOB: [birth year], 62 y.o.   MRN: [id number]  HPI  62 year old patient who is seen today for her biannual followup. She has a history of hypertension which has been well-controlled on lisinopril. No new concerns or complaints. She was seen 10 days ago and treated apparently for what was felt to be recurrent shingles. Do well today without concerns or complaints. There has been some modest needed weight gain over the year  Past Medical History  Diagnosis Date  . Alcoholism 02/02/2009  . ANXIETY 01/21/2007  . DIVERTICULOSIS 07/09/2007  . Duodenal ulcer hemorrhage perforated 2006  . HYPERTENSION 06/23/2007  . HYPONATREMIA 02/02/2009    better off diuretic  . INSOMNIA 05/31/2007  . OSTEOPOROSIS 01/21/2007  . Raynaud's syndrome 06/23/2007  . VITAMIN B12 DEFICIENCY 10/02/2009  . Zoster 2012    twice - left groin/vagina  . NSAID-induced duodenal ulcer   . Blood transfusion     hx of 2005   . Headache(784.0) 05/31/2007    pt denies at 08/12/11 preop visit   . Iron deficiency anemia     pt denies at 08/12/11 visit   . Recurrent upper respiratory infection (URI)     pt reports slight cold using Nyquil prn at bedtime     History   Social History  . Marital Status: Married    Spouse Name: N/A    Number of Children: 1  . Years of Education: N/A   Occupational History  . FT student    Social History Main Topics  . Smoking status: Never Smoker   . Smokeless tobacco: Never Used  . Alcohol Use: No  . Drug Use: No  . Sexual Activity: Not on file   Other Topics Concern  . Not on file   Social History Narrative  . No narrative on file    Past Surgical History  Procedure Laterality Date  . Tubal ligation  1977  . Upper gastrointestinal endoscopy  09/16/2007; 12/21/2008; 03/17/2011    2009: Normal 2010: Normal with enteroscopy2012: Gastrojejunal  anastomotic ulcer, gastric retention  . Colonoscopy  06/18/2007    angulated and stenotic  sigmoid colon with severe sigmoid diverticulosis  . Exploratory laparotomy  08/03/2004    1) duodenal ulcer oversew, pyloroplasty, anterior, posterior and truncal vagotomies  . Exploratory laparotomy  10/20/2004    1) duodenal ulcer oversew  with exclusion2) side-side gastrojejunostomy 3) feeding jejunostomy  . Laparotomy  08/14/2011    Procedure: EXPLORATORY LAPAROTOMY;  Surgeon: Kandis Cocking, MD;  Location: [facility];  Service: General;  Laterality: N/A;  exploratory laparotomy, lysis of adhesions, revision gastrojejunostomy, upper endoscopy  . Laparoscopy  08/14/2011    Procedure: LAPAROSCOPY DIAGNOSTIC;  Surgeon: Kandis Cocking, MD;  Location: [facility];  Service: General;  Laterality: N/A;    Family History  Problem Relation Age of Onset  . Stroke Father     Allergies  Allergen Reactions  . Sulfonamide Derivatives Anaphylaxis and Rash  . Penicillins     REACTION: unspecified  . Hydromorphone Hcl Rash    Current Outpatient Prescriptions on File Prior to Visit  Medication Sig Dispense Refill  . acyclovir cream (ZOVIRAX) 5 % Apply 1 application topically every 3 (three) hours.  15 g  0  . ALPRAZolam (XANAX) 0.5 MG tablet TAKE 1 TABLET 3 TIMES DAILY AS NEEDED.  60 tablet  1  . alum & mag hydroxide-simeth (MAALOX/MYLANTA) 200-200-20 MG/5ML suspension Take 5 mLs by mouth every  6 (six) hours as needed for indigestion.      . hydrocortisone (ANUSOL-HC) 25 MG suppository Place 1 suppository (25 mg total) rectally 2 (two) times daily.  12 suppository  0  . lisinopril (PRINIVIL,ZESTRIL) 20 MG tablet Take 1 tablet (20 mg total) by mouth daily with breakfast.  90 tablet  4   No current facility-administered medications on file prior to visit.    BP 130/80  Pulse 101  Temp(Src) 98.4 F (36.9 C) (Oral)  Wt 107 lb (48.535 kg)  BMI 20.23 kg/m2  SpO2 98%     Review of Systems  Constitutional: Negative.   HENT: Negative for hearing loss, congestion, sore throat, rhinorrhea, dental problem,  sinus pressure and tinnitus.   Eyes: Negative for pain, discharge and visual disturbance.  Respiratory: Negative for cough and shortness of breath.   Cardiovascular: Negative for chest pain, palpitations and leg swelling.  Gastrointestinal: Negative for nausea, vomiting, abdominal pain, diarrhea, constipation, blood in stool and abdominal distention.  Genitourinary: Negative for dysuria, urgency, frequency, hematuria, flank pain, vaginal bleeding, vaginal discharge, difficulty urinating, vaginal pain and pelvic pain.  Musculoskeletal: Negative for joint swelling, arthralgias and gait problem.  Skin: Negative for rash.  Neurological: Negative for dizziness, syncope, speech difficulty, weakness, numbness and headaches.  Hematological: Negative for adenopathy.  Psychiatric/Behavioral: Negative for behavioral problems, dysphoric mood and agitation. The patient is not nervous/anxious.        Objective:   Physical Exam  Constitutional: She is oriented to person, place, and time. She appears well-developed and well-nourished.  HENT:  Head: Normocephalic.  Right Ear: External ear normal.  Left Ear: External ear normal.  Mouth/Throat: Oropharynx is clear and moist.  Eyes: Conjunctivae and EOM are normal. Pupils are equal, round, and reactive to light.  Neck: Normal range of motion. Neck supple. No thyromegaly present.  Cardiovascular: Normal rate, regular rhythm, normal heart sounds and intact distal pulses.   Pulmonary/Chest: Effort normal and breath sounds normal.  Abdominal: Soft. Bowel sounds are normal. She exhibits no mass. There is no tenderness.  Musculoskeletal: Normal range of motion.  Lymphadenopathy:    She has no cervical adenopathy.  Neurological: She is alert and oriented to person, place, and time.  Skin: Skin is warm and dry. No rash noted.  Deeply tanned  Psychiatric: She has a normal mood and affect. Her behavior is normal.          Assessment & Plan:    Hypertension well controlled. We'll continue a low-salt diet and her present blood pressure regimen Moderation of sun exposure encouraged  CPX in 6 months

## 2013-03-10 NOTE — Patient Instructions (Signed)
Limit your sodium (Salt) intake  Please check your blood pressure on a regular basis.  If it is consistently greater than 150/90, please make an office appointment.  Return in 6 months for follow-up   

## 2013-04-28 ENCOUNTER — Other Ambulatory Visit: Payer: Self-pay | Admitting: Internal Medicine

## 2013-04-28 NOTE — Telephone Encounter (Signed)
Rx last filled 03/05/2013 #60 with 1 rf.  Pt last seen 03/10/2013. Pls advise.

## 2013-04-28 NOTE — Telephone Encounter (Signed)
ok 

## 2013-04-29 NOTE — Telephone Encounter (Signed)
Rx called in to pharmacy. 

## 2013-05-23 ENCOUNTER — Other Ambulatory Visit: Payer: Self-pay | Admitting: Internal Medicine

## 2013-08-22 ENCOUNTER — Other Ambulatory Visit: Payer: Self-pay | Admitting: Internal Medicine

## 2013-09-12 ENCOUNTER — Ambulatory Visit (INDEPENDENT_AMBULATORY_CARE_PROVIDER_SITE_OTHER): Payer: BC Managed Care – PPO | Admitting: Internal Medicine

## 2013-09-12 ENCOUNTER — Encounter: Payer: Self-pay | Admitting: Internal Medicine

## 2013-09-12 ENCOUNTER — Ambulatory Visit: Payer: BC Managed Care – PPO | Admitting: Internal Medicine

## 2013-09-12 VITALS — BP 130/60 | HR 94 | Temp 98.4°F | Resp 20 | Ht 61.0 in | Wt 113.0 lb

## 2013-09-12 DIAGNOSIS — I10 Essential (primary) hypertension: Secondary | ICD-10-CM

## 2013-09-12 DIAGNOSIS — F411 Generalized anxiety disorder: Secondary | ICD-10-CM

## 2013-09-12 MED ORDER — LISINOPRIL 20 MG PO TABS: 20.0000 mg | ORAL_TABLET | Freq: Every day | ORAL | Status: DC

## 2013-09-12 NOTE — Progress Notes (Signed)
Subjective:    Patient ID: [name], female    DOB: 28-Aug-1950, 63 y.o.   MRN: [id number]  HPI 63 year old patient who is seen today for followup of hypertension.  This has been well-controlled on lisinopril.  She has done quite well over the past 6 months with some modest weight gain.  She has chronic anxiety and does use the alprazolam when necessary.  No new concerns or complaints.  Past Medical History  Diagnosis Date  . Alcoholism 02/02/2009  . ANXIETY 01/21/2007  . DIVERTICULOSIS 07/09/2007  . Duodenal ulcer hemorrhage perforated 2006  . HYPERTENSION 06/23/2007  . HYPONATREMIA 02/02/2009    better off diuretic  . INSOMNIA 05/31/2007  . OSTEOPOROSIS 01/21/2007  . Raynaud's syndrome 06/23/2007  . VITAMIN B12 DEFICIENCY 10/02/2009  . Zoster 2012    twice - left groin/vagina  . NSAID-induced duodenal ulcer   . Blood transfusion     hx of 2005   . Headache(784.0) 05/31/2007    pt denies at 08/12/11 preop visit   . Iron deficiency anemia     pt denies at 08/12/11 visit   . Recurrent upper respiratory infection (URI)     pt reports slight cold using Nyquil prn at bedtime     History   Social History  . Marital Status: Married    Spouse Name: N/A    Number of Children: 1  . Years of Education: N/A   Occupational History  . FT student    Social History Main Topics  . Smoking status: Never Smoker   . Smokeless tobacco: Never Used  . Alcohol Use: No  . Drug Use: No  . Sexual Activity: Not on file   Other Topics Concern  . Not on file   Social History Narrative  . No narrative on file    Past Surgical History  Procedure Laterality Date  . Tubal ligation  1977  . Upper gastrointestinal endoscopy  09/16/2007; 12/21/2008; 03/17/2011    2009: Normal 2010: Normal with enteroscopy2012: Gastrojejunal  anastomotic ulcer, gastric retention  . Colonoscopy  06/18/2007    angulated and stenotic sigmoid colon with severe sigmoid diverticulosis  . Exploratory laparotomy  08/03/2004      1) duodenal ulcer oversew, pyloroplasty, anterior, posterior and truncal vagotomies  . Exploratory laparotomy  10/20/2004    1) duodenal ulcer oversew  with exclusion2) side-side gastrojejunostomy 3) feeding jejunostomy  . Laparotomy  08/14/2011    Procedure: EXPLORATORY LAPAROTOMY;  Surgeon: Kandis Cocking, MD;  Location: [facility];  Service: General;  Laterality: N/A;  exploratory laparotomy, lysis of adhesions, revision gastrojejunostomy, upper endoscopy  . Laparoscopy  08/14/2011    Procedure: LAPAROSCOPY DIAGNOSTIC;  Surgeon: Kandis Cocking, MD;  Location: [facility];  Service: General;  Laterality: N/A;    Family History  Problem Relation Age of Onset  . Stroke Father     Allergies  Allergen Reactions  . Sulfonamide Derivatives Anaphylaxis and Rash  . Penicillins     REACTION: unspecified  . Hydromorphone Hcl Rash    Current Outpatient Prescriptions on File Prior to Visit  Medication Sig Dispense Refill  . ALPRAZolam (XANAX) 0.5 MG tablet take 1 tablet by mouth three times a day if needed  60 tablet  2  . alum & mag hydroxide-simeth (MAALOX/MYLANTA) 200-200-20 MG/5ML suspension Take 5 mLs by mouth every 6 (six) hours as needed for indigestion.       No current facility-administered medications on file prior to visit.    BP 130/60  Pulse 94  Temp(Src) 98.4 F (36.9 C) (Oral)  Resp 20  Ht 5\' 1"  (1.549 m)  Wt 113 lb (51.256 kg)  BMI 21.36 kg/m2  SpO2 98%         Review of Systems     Objective:   Physical Exam        Assessment & Plan:

## 2013-09-12 NOTE — Progress Notes (Signed)
Pre-visit discussion using our clinic review tool. No additional management support is needed unless otherwise documented below in the visit note.  

## 2013-09-12 NOTE — Patient Instructions (Signed)
Limit your sodium (Salt) intake  Take a calcium supplement, plus 800-1200 units of vitamin D    It is important that you exercise regularly, at least 20 minutes 3 to 4 times per week.  If you develop chest pain or shortness of breath seek  medical attention.  Return in 6 months for follow-up 

## 2013-10-07 ENCOUNTER — Ambulatory Visit: Payer: BC Managed Care – PPO | Admitting: Internal Medicine

## 2013-10-24 ENCOUNTER — Telehealth: Payer: Self-pay | Admitting: Internal Medicine

## 2013-10-24 NOTE — Telephone Encounter (Signed)
Pt is having lower back pain, wants to see dr. Kirtland Bouchardk tomorrow. Ok to use sda 11:00?

## 2013-10-24 NOTE — Telephone Encounter (Signed)
Yes, you can schedule. 

## 2013-10-25 ENCOUNTER — Ambulatory Visit (INDEPENDENT_AMBULATORY_CARE_PROVIDER_SITE_OTHER): Payer: BC Managed Care – PPO | Admitting: Internal Medicine

## 2013-10-25 ENCOUNTER — Encounter: Payer: Self-pay | Admitting: Internal Medicine

## 2013-10-25 VITALS — BP 120/64 | HR 73 | Temp 98.0°F | Resp 20 | Ht 61.0 in | Wt 116.0 lb

## 2013-10-25 DIAGNOSIS — I10 Essential (primary) hypertension: Secondary | ICD-10-CM

## 2013-10-25 DIAGNOSIS — M810 Age-related osteoporosis without current pathological fracture: Secondary | ICD-10-CM

## 2013-10-25 NOTE — Patient Instructions (Signed)
You  may move around, but avoid painful motions and activities.  Apply ice to the sore area for 15 to 20 minutes 3 or 4 times daily for the next two to 3 days.  Diclofenac one twice daily

## 2013-10-25 NOTE — Progress Notes (Signed)
Pre-visit discussion using our clinic review tool. No additional management support is needed unless otherwise documented below in the visit note.  

## 2013-10-25 NOTE — Progress Notes (Signed)
   Subjective:    Patient ID: [name], female    DOB: October 09, 1950, 63 y.o.   MRN: [id number]  HPI  Wt Readings from Last 3 Encounters:  10/25/13 116 lb (52.617 kg)  09/12/13 113 lb (51.256 kg)  03/10/13 107 lb (48.535 kg)     Review of Systems     Objective:   Physical Exam        Assessment & Plan:

## 2013-10-25 NOTE — Telephone Encounter (Signed)
appt scheduled for pt.  

## 2013-10-25 NOTE — Progress Notes (Signed)
Subjective:    Patient ID: [name], female    DOB: [birth year], 63 y.o.   MRN: [id number]  HPI  63 year old patient who has treated hypertension.  She presents with a several day history of left posterior back pain following trauma.  She states that she injured this area due to trauma with her kitchen counter.  She has had persistent discomfort and at times difficulty sleeping.  Past Medical History  Diagnosis Date  . Alcoholism 02/02/2009  . ANXIETY 01/21/2007  . DIVERTICULOSIS 07/09/2007  . Duodenal ulcer hemorrhage perforated 2006  . HYPERTENSION 06/23/2007  . HYPONATREMIA 02/02/2009    better off diuretic  . INSOMNIA 05/31/2007  . OSTEOPOROSIS 01/21/2007  . Raynaud's syndrome 06/23/2007  . VITAMIN B12 DEFICIENCY 10/02/2009  . Zoster 2012    twice - left groin/vagina  . NSAID-induced duodenal ulcer   . Blood transfusion     hx of 2005   . Headache(784.0) 05/31/2007    pt denies at 08/12/11 preop visit   . Iron deficiency anemia     pt denies at 08/12/11 visit   . Recurrent upper respiratory infection (URI)     pt reports slight cold using Nyquil prn at bedtime     History   Social History  . Marital Status: Married    Spouse Name: N/A    Number of Children: 1  . Years of Education: N/A   Occupational History  . FT student    Social History Main Topics  . Smoking status: Never Smoker   . Smokeless tobacco: Never Used  . Alcohol Use: No  . Drug Use: No  . Sexual Activity: Not on file   Other Topics Concern  . Not on file   Social History Narrative  . No narrative on file    Past Surgical History  Procedure Laterality Date  . Tubal ligation  1977  . Upper gastrointestinal endoscopy  09/16/2007; 12/21/2008; 03/17/2011    2009: Normal 2010: Normal with enteroscopy2012: Gastrojejunal  anastomotic ulcer, gastric retention  . Colonoscopy  06/18/2007    angulated and stenotic sigmoid colon with severe sigmoid diverticulosis  . Exploratory laparotomy  08/03/2004    1)  duodenal ulcer oversew, pyloroplasty, anterior, posterior and truncal vagotomies  . Exploratory laparotomy  10/20/2004    1) duodenal ulcer oversew  with exclusion2) side-side gastrojejunostomy 3) feeding jejunostomy  . Laparotomy  08/14/2011    Procedure: EXPLORATORY LAPAROTOMY;  Surgeon: Kandis Cockingavid H Newman, MD;  Location: [facility];  Service: General;  Laterality: N/A;  exploratory laparotomy, lysis of adhesions, revision gastrojejunostomy, upper endoscopy  . Laparoscopy  08/14/2011    Procedure: LAPAROSCOPY DIAGNOSTIC;  Surgeon: Kandis Cockingavid H Newman, MD;  Location: [facility];  Service: General;  Laterality: N/A;    Family History  Problem Relation Age of Onset  . Stroke Father     Allergies  Allergen Reactions  . Sulfonamide Derivatives Anaphylaxis and Rash  . Penicillins     REACTION: unspecified  . Hydromorphone Hcl Rash    Current Outpatient Prescriptions on File Prior to Visit  Medication Sig Dispense Refill  . ALPRAZolam (XANAX) 0.5 MG tablet take 1 tablet by mouth three times a day if needed  60 tablet  2  . alum & mag hydroxide-simeth (MAALOX/MYLANTA) 200-200-20 MG/5ML suspension Take 5 mLs by mouth every 6 (six) hours as needed for indigestion.      Marland Kitchen. lisinopril (PRINIVIL,ZESTRIL) 20 MG tablet Take 1 tablet (20 mg total) by mouth daily with breakfast.  90  tablet  3   No current facility-administered medications on file prior to visit.    BP 120/64  Pulse 73  Temp(Src) 98 F (36.7 C) (Oral)  Resp 20  Ht 5\' 1"  (1.549 m)  Wt 116 lb (52.617 kg)  BMI 21.93 kg/m2  SpO2 98%       Review of Systems  Constitutional: Negative.   HENT: Negative for congestion, dental problem, hearing loss, rhinorrhea, sinus pressure, sore throat and tinnitus.   Eyes: Negative for pain, discharge and visual disturbance.  Respiratory: Negative for cough and shortness of breath.   Cardiovascular: Negative for chest pain, palpitations and leg swelling.  Gastrointestinal: Negative for nausea, vomiting,  abdominal pain, diarrhea, constipation, blood in stool and abdominal distention.  Genitourinary: Negative for dysuria, urgency, frequency, hematuria, flank pain, vaginal bleeding, vaginal discharge, difficulty urinating, vaginal pain and pelvic pain.  Musculoskeletal: Positive for back pain. Negative for arthralgias, gait problem and joint swelling.  Skin: Negative for rash.  Neurological: Negative for dizziness, syncope, speech difficulty, weakness, numbness and headaches.  Hematological: Negative for adenopathy.  Psychiatric/Behavioral: Negative for behavioral problems, dysphoric mood and agitation. The patient is not nervous/anxious.        Objective:   Physical Exam  Constitutional: She is oriented to person, place, and time. She appears well-developed and well-nourished.  HENT:  Head: Normocephalic.  Right Ear: External ear normal.  Left Ear: External ear normal.  Mouth/Throat: Oropharynx is clear and moist.  Eyes: Conjunctivae and EOM are normal. Pupils are equal, round, and reactive to light.  Neck: Normal range of motion. Neck supple. No thyromegaly present.  Cardiovascular: Normal rate, regular rhythm, normal heart sounds and intact distal pulses.   Pulmonary/Chest: Effort normal and breath sounds normal.  Abdominal: Soft. Bowel sounds are normal. She exhibits no mass. There is no tenderness.  Musculoskeletal: Normal range of motion.  Mild tenderness in the left lower thoracic chest wall region.  No ecchymoses  Lymphadenopathy:    She has no cervical adenopathy.  Neurological: She is alert and oriented to person, place, and time.  Skin: Skin is warm and dry. No rash noted.  Psychiatric: She has a normal mood and affect. Her behavior is normal.          Assessment & Plan:   Left posterior back pain secondary to trauma.  We'll continue Tylenol.  We'll give samples of diclofenac to take twice daily.  Hypertension stable

## 2013-10-26 ENCOUNTER — Telehealth: Payer: Self-pay | Admitting: Internal Medicine

## 2013-10-26 NOTE — Telephone Encounter (Signed)
Relevant patient education mailed to patient.  

## 2013-11-18 ENCOUNTER — Other Ambulatory Visit: Payer: Self-pay | Admitting: Internal Medicine

## 2014-02-01 ENCOUNTER — Emergency Department (HOSPITAL_COMMUNITY): Payer: BC Managed Care – PPO

## 2014-02-01 ENCOUNTER — Encounter (HOSPITAL_COMMUNITY): Payer: Self-pay | Admitting: Emergency Medicine

## 2014-02-01 ENCOUNTER — Emergency Department (HOSPITAL_COMMUNITY)
Admission: EM | Admit: 2014-02-01 | Discharge: 2014-02-01 | Disposition: A | Discharge status: Home / Self Care | Payer: BC Managed Care – PPO | Attending: Emergency Medicine | Admitting: Emergency Medicine

## 2014-02-01 DIAGNOSIS — F411 Generalized anxiety disorder: Secondary | ICD-10-CM | POA: Insufficient documentation

## 2014-02-01 DIAGNOSIS — Z8709 Personal history of other diseases of the respiratory system: Secondary | ICD-10-CM | POA: Insufficient documentation

## 2014-02-01 DIAGNOSIS — I10 Essential (primary) hypertension: Secondary | ICD-10-CM | POA: Insufficient documentation

## 2014-02-01 DIAGNOSIS — F10920 Alcohol use, unspecified with intoxication, uncomplicated: Secondary | ICD-10-CM

## 2014-02-01 DIAGNOSIS — Z880 Allergy status to penicillin: Secondary | ICD-10-CM | POA: Insufficient documentation

## 2014-02-01 DIAGNOSIS — Z8619 Personal history of other infectious and parasitic diseases: Secondary | ICD-10-CM | POA: Insufficient documentation

## 2014-02-01 DIAGNOSIS — Z8639 Personal history of other endocrine, nutritional and metabolic disease: Secondary | ICD-10-CM | POA: Insufficient documentation

## 2014-02-01 DIAGNOSIS — S0990XA Unspecified injury of head, initial encounter: Secondary | ICD-10-CM

## 2014-02-01 DIAGNOSIS — Z23 Encounter for immunization: Secondary | ICD-10-CM | POA: Insufficient documentation

## 2014-02-01 DIAGNOSIS — W19XXXA Unspecified fall, initial encounter: Secondary | ICD-10-CM

## 2014-02-01 DIAGNOSIS — R197 Diarrhea, unspecified: Secondary | ICD-10-CM | POA: Insufficient documentation

## 2014-02-01 DIAGNOSIS — S0120XA Unspecified open wound of nose, initial encounter: Secondary | ICD-10-CM | POA: Insufficient documentation

## 2014-02-01 DIAGNOSIS — Z862 Personal history of diseases of the blood and blood-forming organs and certain disorders involving the immune mechanism: Secondary | ICD-10-CM | POA: Insufficient documentation

## 2014-02-01 DIAGNOSIS — Z8739 Personal history of other diseases of the musculoskeletal system and connective tissue: Secondary | ICD-10-CM | POA: Insufficient documentation

## 2014-02-01 DIAGNOSIS — Z79899 Other long term (current) drug therapy: Secondary | ICD-10-CM | POA: Insufficient documentation

## 2014-02-01 DIAGNOSIS — W010XXA Fall on same level from slipping, tripping and stumbling without subsequent striking against object, initial encounter: Secondary | ICD-10-CM | POA: Insufficient documentation

## 2014-02-01 DIAGNOSIS — W1811XA Fall from or off toilet without subsequent striking against object, initial encounter: Secondary | ICD-10-CM | POA: Insufficient documentation

## 2014-02-01 DIAGNOSIS — Y9389 Activity, other specified: Secondary | ICD-10-CM | POA: Insufficient documentation

## 2014-02-01 DIAGNOSIS — F1010 Alcohol abuse, uncomplicated: Secondary | ICD-10-CM | POA: Insufficient documentation

## 2014-02-01 DIAGNOSIS — Y9289 Other specified places as the place of occurrence of the external cause: Secondary | ICD-10-CM | POA: Insufficient documentation

## 2014-02-01 DIAGNOSIS — Z8719 Personal history of other diseases of the digestive system: Secondary | ICD-10-CM | POA: Insufficient documentation

## 2014-02-01 DIAGNOSIS — D649 Anemia, unspecified: Secondary | ICD-10-CM | POA: Insufficient documentation

## 2014-02-01 LAB — PROTIME-INR
INR: 0.98 (ref 0.00–1.49)
PROTHROMBIN TIME: 13 s (ref 11.6–15.2)

## 2014-02-01 LAB — COMPREHENSIVE METABOLIC PANEL
ALBUMIN: 3.9 g/dL (ref 3.5–5.2)
ALT: 23 U/L (ref 0–35)
ANION GAP: 19 — AB (ref 5–15)
AST: 38 U/L — AB (ref 0–37)
Alkaline Phosphatase: 108 U/L (ref 39–117)
BUN: 10 mg/dL (ref 6–23)
CALCIUM: 9.1 mg/dL (ref 8.4–10.5)
CO2: 25 mEq/L (ref 19–32)
CREATININE: 0.44 mg/dL — AB (ref 0.50–1.10)
Chloride: 87 mEq/L — ABNORMAL LOW (ref 96–112)
GFR calc Af Amer: >90 mL/min (ref >90)
GFR calc non Af Amer: >90 mL/min (ref >90)
Glucose, Bld: 91 mg/dL (ref 70–99)
Potassium: 3.7 mEq/L (ref 3.7–5.3)
Sodium: 131 mEq/L — ABNORMAL LOW (ref 137–147)
Total Bilirubin: 0.2 mg/dL — ABNORMAL LOW (ref 0.3–1.2)
Total Protein: 6.7 g/dL (ref 6.0–8.3)

## 2014-02-01 LAB — URINE MICROSCOPIC-ADD ON

## 2014-02-01 LAB — CBC WITH DIFFERENTIAL/PLATELET
BASOS ABS: 0.1 10*3/uL (ref 0.0–0.1)
Basophils Relative: 1 % (ref 0–1)
Eosinophils Absolute: 0.1 10*3/uL (ref 0.0–0.7)
Eosinophils Relative: 1 % (ref 0–5)
HCT: 25 % — ABNORMAL LOW (ref 36.0–46.0)
Hemoglobin: 7.3 g/dL — ABNORMAL LOW (ref 12.0–15.0)
LYMPHS ABS: 1.5 10*3/uL (ref 0.7–4.0)
Lymphocytes Relative: 24 % (ref 12–46)
MCH: 20.1 pg — AB (ref 26.0–34.0)
MCHC: 29.2 g/dL — AB (ref 30.0–36.0)
MCV: 68.9 fL — AB (ref 78.0–100.0)
MONO ABS: 0.6 10*3/uL (ref 0.1–1.0)
MONOS PCT: 10 % (ref 3–12)
Neutro Abs: 4 10*3/uL (ref 1.7–7.7)
Neutrophils Relative %: 64 % (ref 43–77)
Platelets: 321 10*3/uL (ref 150–400)
RBC: 3.63 MIL/uL — ABNORMAL LOW (ref 3.87–5.11)
RDW: 16.8 % — AB (ref 11.5–15.5)
WBC: 6.3 10*3/uL (ref 4.0–10.5)

## 2014-02-01 LAB — ETHANOL: ALCOHOL ETHYL (B): 233 mg/dL — AB (ref 0–11)

## 2014-02-01 LAB — URINALYSIS, ROUTINE W REFLEX MICROSCOPIC
Bilirubin Urine: NEGATIVE
Glucose, UA: NEGATIVE mg/dL
Hgb urine dipstick: NEGATIVE
Ketones, ur: NEGATIVE mg/dL
NITRITE: NEGATIVE
PROTEIN: NEGATIVE mg/dL
SPECIFIC GRAVITY, URINE: 1.005 (ref 1.005–1.030)
UROBILINOGEN UA: 0.2 mg/dL (ref 0.0–1.0)
pH: 7 (ref 5.0–8.0)

## 2014-02-01 LAB — I-STAT TROPONIN, ED: Troponin i, poc: 0.01 ng/mL (ref 0.00–0.08)

## 2014-02-01 LAB — APTT: APTT: 29 s (ref 24–37)

## 2014-02-01 LAB — POC OCCULT BLOOD, ED: Fecal Occult Bld: NEGATIVE

## 2014-02-01 MED ORDER — TETANUS-DIPHTH-ACELL PERTUSSIS 5-2.5-18.5 LF-MCG/0.5 IM SUSP
0.5000 mL | Freq: Once | INTRAMUSCULAR | Status: AC
  Administered 2014-02-01: 0.5 mL via INTRAMUSCULAR
  Filled 2014-02-01: qty 0.5

## 2014-02-01 NOTE — ED Provider Notes (Signed)
CSN: [id number]     Arrival date & time 02/01/14  0113 History   First MD Initiated Contact with Patient 02/01/14 0114     Chief Complaint  Patient presents with  . Diarrhea  . Facial Laceration  . Fall     (Consider location/radiation/quality/duration/timing/severity/associated sxs/prior Treatment) HPI Patient presents after a fall from the toilet in the bathroom. Family member states he heard her fall and went to check on her. She had no loss of consciousness or seizure activity. Patient is a vague historian. Family member states she's been drinking heavily this evening. Patient does not deny this. She states she had a loose stool and slipped and fell off the toilet. She had no lightheadedness. She denies any chest pain or shortness of breath. She sustained a small cut to the bridge of her nose. Unknown last tetanus. Patient denies any neck pain. She is placed in a cervical collar by EMS.  Past Medical History  Diagnosis Date  . Alcoholism 02/02/2009  . ANXIETY 01/21/2007  . DIVERTICULOSIS 07/09/2007  . Duodenal ulcer hemorrhage perforated 2006  . HYPERTENSION 06/23/2007  . HYPONATREMIA 02/02/2009    better off diuretic  . INSOMNIA 05/31/2007  . OSTEOPOROSIS 01/21/2007  . Raynaud's syndrome 06/23/2007  . VITAMIN B12 DEFICIENCY 10/02/2009  . Zoster 2012    twice - left groin/vagina  . NSAID-induced duodenal ulcer   . Blood transfusion     hx of 2005   . Headache(784.0) 05/31/2007    pt denies at 08/12/11 preop visit   . Iron deficiency anemia     pt denies at 08/12/11 visit   . Recurrent upper respiratory infection (URI)     pt reports slight cold using Nyquil prn at bedtime    Past Surgical History  Procedure Laterality Date  . Tubal ligation  1977  . Upper gastrointestinal endoscopy  09/16/2007; 12/21/2008; 03/17/2011    2009: Normal 2010: Normal with enteroscopy2012: Gastrojejunal  anastomotic ulcer, gastric retention  . Colonoscopy  06/18/2007    angulated and stenotic sigmoid  colon with severe sigmoid diverticulosis  . Exploratory laparotomy  08/03/2004    1) duodenal ulcer oversew, pyloroplasty, anterior, posterior and truncal vagotomies  . Exploratory laparotomy  10/20/2004    1) duodenal ulcer oversew  with exclusion2) side-side gastrojejunostomy 3) feeding jejunostomy  . Laparotomy  08/14/2011    Procedure: EXPLORATORY LAPAROTOMY;  Surgeon: Kandis Cocking, MD;  Location: [facility];  Service: General;  Laterality: N/A;  exploratory laparotomy, lysis of adhesions, revision gastrojejunostomy, upper endoscopy  . Laparoscopy  08/14/2011    Procedure: LAPAROSCOPY DIAGNOSTIC;  Surgeon: Kandis Cocking, MD;  Location: [facility];  Service: General;  Laterality: N/A;   Family History  Problem Relation Age of Onset  . Stroke Father    History  Substance Use Topics  . Smoking status: Never Smoker   . Smokeless tobacco: Never Used  . Alcohol Use: No   OB History   Grav Para Term Preterm Abortions TAB SAB Ect Mult Living                 Review of Systems  Constitutional: Negative for fever and chills.  Respiratory: Negative for shortness of breath.   Cardiovascular: Negative for chest pain.  Gastrointestinal: Positive for diarrhea. Negative for nausea, vomiting, abdominal pain and blood in stool.  Musculoskeletal: Negative for neck pain and neck stiffness.  Skin: Positive for wound. Negative for rash.  Neurological: Negative for dizziness, weakness, light-headedness, numbness and headaches.  All other  systems reviewed and are negative.     Allergies  Sulfonamide derivatives; Penicillins; and Hydromorphone hcl  Home Medications   Prior to Admission medications   Medication Sig Start Date End Date Taking? Authorizing Provider  acetaminophen (TYLENOL) 500 MG tablet Take 500 mg by mouth every 6 (six) hours as needed.    Historical Provider, MD  ALPRAZolam Prudy Feeler) 0.5 MG tablet take 1 tablet by mouth three times a day if needed    Gordy Savers, MD  alum & mag  hydroxide-simeth (MAALOX/MYLANTA) 200-200-20 MG/5ML suspension Take 5 mLs by mouth every 6 (six) hours as needed for indigestion.    Historical Provider, MD  lisinopril (PRINIVIL,ZESTRIL) 20 MG tablet Take 1 tablet (20 mg total) by mouth daily with breakfast. 09/12/13 09/12/14  Gordy Savers, MD   BP 152/83  Pulse 70  Temp(Src) 97.5 F (36.4 C) (Oral)  Resp 19  Ht 5\' 3"  (1.6 m)  Wt 115 lb (52.164 kg)  BMI 20.38 kg/m2  SpO2 97% Physical Exam  Nursing note and vitals reviewed. Constitutional: She is oriented to person, place, and time. She appears well-developed and well-nourished. No distress.  HENT:  Head: Normocephalic.  Mouth/Throat: Oropharynx is clear and moist. No oropharyngeal exudate.  Less than 1 cm laceration to the bridge of patient's nose. There is no active bleeding.  Eyes: EOM are normal. Pupils are equal, round, and reactive to light.  Neck:  Cervical collar in place  Cardiovascular: Normal rate and regular rhythm.   Pulmonary/Chest: Effort normal and breath sounds normal. No respiratory distress. She has no wheezes. She has no rales. She exhibits no tenderness.  Abdominal: Soft. Bowel sounds are normal. She exhibits no distension and no mass. There is no tenderness. There is no rebound and no guarding.  Musculoskeletal: Normal range of motion. She exhibits no edema and no tenderness.  No thoracic or lumbar tenderness to palpation. Pelvis is stable. She has full range of motion of all extremities without pain or deformity. Distal pulses intact.  Neurological: She is alert and oriented to person, place, and time.  5/5 motor in all extremities. Sensation is intact.  Skin: Skin is warm and dry. No rash noted. No erythema.  Psychiatric:  Distant and non-forthcoming with information.    ED Course  Procedures (including critical care time) Labs Review Labs Reviewed  CBC WITH DIFFERENTIAL  COMPREHENSIVE METABOLIC PANEL  PROTIME-INR  URINALYSIS, ROUTINE W REFLEX  MICROSCOPIC  APTT  ETHANOL  OCCULT BLOOD X 1 CARD TO LAB, STOOL  I-STAT TROPOININ, ED    Imaging Review No results found.   EKG Interpretation   Date/Time:  Wednesday February 01 2014 01:52:19 EDT Ventricular Rate:  73 PR Interval:  188 QRS Duration: 87 QT Interval:  415 QTC Calculation: 457 R Axis:   64 Text Interpretation:  Pacemaker spikes or artifacts Sinus rhythm Probable  LVH with secondary repol abnrm Abnormal inferior Q waves Confirmed by  Ranae Palms  MD, Norris Brumbach ([id number]) on 02/01/2014 5:31:45 AM      MDM   Final diagnoses:  None      Patient is now much more alert. C-spine cleared. Lacerations abrasions in those do not appear to need repair. Place bandage. Discussed laboratory findings of anemia. Husband states patient has a history of anemia and has required iron infusions in the past. Patient is currently asymptomatic. Stool guaiac negative. I have encouraged the patient to follow with her primary Dr. regarding her anemia and possible need for blood transfusion in the future.  Patient is ambulating without difficulty. No lightheadedness. We'll discharge home with spouse. Return precautions given.  Loren Raceravid Jahlon Baines, MD 02/01/14 [phone]

## 2014-02-01 NOTE — ED Notes (Signed)
Pt presents to ED via [facility], pt called 911 due to Nausea, Vomiting, Diarrhea, possible bloody stool, nose lac due to a fall, and alcohol intoxication. Pt fell tonight but unable to give a complete detail of the event, pt's stories about fall are inconsistent. Pt also had approx 8-10 beers tonight. Pt had a bowel resection 06/2012. Pt states she's not sure if the blood is coming from her nose ;ac or her stool. EMS reports a large amount of stool on the bathroom floor, did not appreciate any blood in the stool. Pt has a hematoma to forehead, and due to poor historian c-collar applied. Spouse also denies any blood in stool. VSS BP 146/86, HR 72, CBG 121, passed SCCA. 20 G SL Right Hand

## 2014-02-01 NOTE — ED Notes (Signed)
Patient transported to CT 

## 2014-02-01 NOTE — Discharge Instructions (Signed)
Alcohol Intoxication Alcohol intoxication occurs when the amount of alcohol that a person has consumed impairs his or her ability to mentally and physically function. Alcohol directly impairs the normal chemical activity of the brain. Drinking large amounts of alcohol can lead to changes in mental function and behavior, and it can cause many physical effects that can be harmful.  Alcohol intoxication can range in severity from mild to very severe. Various factors can affect the level of intoxication that occurs, such as the person's age, gender, weight, frequency of alcohol consumption, and the presence of other medical conditions (such as diabetes, seizures, or heart conditions). Dangerous levels of alcohol intoxication may occur when people drink large amounts of alcohol in a short period (binge drinking). Alcohol can also be especially dangerous when combined with certain prescription medicines or "recreational" drugs. SIGNS AND SYMPTOMS Some common signs and symptoms of mild alcohol intoxication include:  Loss of coordination.  Changes in mood and behavior.  Impaired judgment.  Slurred speech. As alcohol intoxication progresses to more severe levels, other signs and symptoms will appear. These may include:  Vomiting.  Confusion and impaired memory.  Slowed breathing.  Seizures.  Loss of consciousness. DIAGNOSIS  Your health care provider will take a medical history and perform a physical exam. You will be asked about the amount and type of alcohol you have consumed. Blood tests will be done to measure the concentration of alcohol in your blood. In many places, your blood alcohol level must be lower than 80 mg/dL (1.61%0.08%) to legally drive. However, many dangerous effects of alcohol can occur at much lower levels.  TREATMENT  People with alcohol intoxication often do not require treatment. Most of the effects of alcohol intoxication are temporary, and they go away as the alcohol naturally  leaves the body. Your health care provider will monitor your condition until you are stable enough to go home. Fluids are sometimes given through an IV access tube to help prevent dehydration.  HOME CARE INSTRUCTIONS  Do not drive after drinking alcohol.  Stay hydrated. Drink enough water and fluids to keep your urine clear or pale yellow. Avoid caffeine.   Only take over-the-counter or prescription medicines as directed by your health care provider.  SEEK MEDICAL CARE IF:   You have persistent vomiting.   You do not feel better after a few days.  You have frequent alcohol intoxication. Your health care provider can help determine if you should see a substance use treatment counselor. SEEK IMMEDIATE MEDICAL CARE IF:   You become shaky or tremble when you try to stop drinking.   You shake uncontrollably (seizure).   You throw up (vomit) blood. This may be bright red or may look like black coffee grounds.   You have blood in your stool. This may be bright red or may appear as a black, tarry, bad smelling stool.   You become lightheaded or faint.  MAKE SURE YOU:   Understand these instructions.  Will watch your condition.  Will get help right away if you are not doing well or get worse. Document Released: 04/16/2005 Document Revised: 03/09/2013 Document Reviewed: 12/10/2012 [facility] Patient Information 2015 [employer], [place]. This information is not intended to replace advice given to you by your health care provider. Make sure you discuss any questions you have with your health care provider.  Head Injury You have received a head injury. It does not appear serious at this time. Headaches and vomiting are common following head injury. It should  easy to awaken from sleeping. Sometimes it is necessary for you to stay in the emergency department for a while for observation. Sometimes admission to the hospital may be needed. After injuries such as yours, most problems occur  within the first 24 hours, but side effects may occur up to 7-10 days after the injury. It is important for you to carefully monitor your condition and contact your health care provider or seek immediate medical care if there is a change in your condition. °WHAT ARE THE TYPES OF HEAD INJURIES? °Head injuries can be as minor as a bump. Some head injuries can be more severe. More severe head injuries include: °· A jarring injury to the brain (concussion). °· A bruise of the brain (contusion). This mean there is bleeding in the brain that can cause swelling. °· A cracked skull (skull fracture). °· Bleeding in the brain that collects, clots, and forms a bump (hematoma). °WHAT CAUSES A HEAD INJURY? °A serious head injury is most likely to happen to someone who is in a car wreck and is not wearing a seat belt. Other causes of major head injuries include bicycle or motorcycle accidents, sports injuries, and falls. °HOW ARE HEAD INJURIES DIAGNOSED? °A complete history of the event leading to the injury and your current symptoms will be helpful in diagnosing head injuries. Many times, pictures of the brain, such as CT or MRI are needed to see the extent of the injury. Often, an overnight hospital stay is necessary for observation.  °WHEN SHOULD I SEEK IMMEDIATE MEDICAL CARE?  °You should get help right away if: °· You have confusion or drowsiness. °· You feel sick to your stomach (nauseous) or have continued, forceful vomiting. °· You have dizziness or unsteadiness that is getting worse. °· You have severe, continued headaches not relieved by medicine. Only take over-the-counter or prescription medicines for pain, fever, or discomfort as directed by your health care provider. °· You do not have normal function of the arms or legs or are unable to walk. °· You notice changes in the black spots in the center of the colored part of your eye (pupil). °· You have a clear or bloody fluid coming from your nose or ears. °· You have  a loss of vision. °During the next 24 hours after the injury, you must stay with someone who can watch you for the warning signs. This person should contact local emergency services (911 in the [place] if you have seizures, you become unconscious, or you are unable to wake up. °HOW CAN I PREVENT A HEAD INJURY IN THE FUTURE? °The most important factor for preventing major head injuries is avoiding motor vehicle accidents.  To minimize the potential for damage to your head, it is crucial to wear seat belts while riding in motor vehicles. Wearing helmets while bike riding and playing collision sports (like football) is also helpful. Also, avoiding dangerous activities around the house will further help reduce your risk of head injury.  °WHEN CAN I RETURN TO NORMAL ACTIVITIES AND ATHLETICS? °You should be reevaluated by your health care provider before returning to these activities. If you have any of the following symptoms, you should not return to activities or contact sports until 1 week after the symptoms have stopped: °· Persistent headache. °· Dizziness or vertigo. °· Poor attention and concentration. °· Confusion. °· Memory problems. °· Nausea or vomiting. °· Fatigue or tire easily. °· Irritability. °· Intolerant of bright lights or loud noises. °· Anxiety or depression. °·   Disturbed sleep. °MAKE SURE YOU:  °· Understand these instructions. °· Will watch your condition. °· Will get help right away if you are not doing well or get worse. °Document Released: 07/07/2005 Document Revised: 07/12/2013 Document Reviewed: 03/14/2013 °ExitCare® Patient Information ©2015 [employer]. This information is not intended to replace advice given to you by your health care provider. Make sure you discuss any questions you have with your health care provider. ° °

## 2014-02-23 ENCOUNTER — Other Ambulatory Visit: Payer: Self-pay | Admitting: Internal Medicine

## 2014-03-14 ENCOUNTER — Encounter: Payer: BC Managed Care – PPO | Admitting: Internal Medicine

## 2014-04-11 ENCOUNTER — Encounter: Payer: Self-pay | Admitting: Internal Medicine

## 2014-04-11 ENCOUNTER — Encounter: Payer: Self-pay | Admitting: *Deleted

## 2014-04-11 ENCOUNTER — Other Ambulatory Visit (HOSPITAL_COMMUNITY)
Admission: RE | Admit: 2014-04-11 | Discharge: 2014-04-11 | Disposition: A | Discharge status: Home / Self Care | Payer: BC Managed Care – PPO | Source: Ambulatory Visit | Attending: Internal Medicine | Admitting: Internal Medicine

## 2014-04-11 ENCOUNTER — Ambulatory Visit (INDEPENDENT_AMBULATORY_CARE_PROVIDER_SITE_OTHER): Payer: BC Managed Care – PPO | Admitting: Internal Medicine

## 2014-04-11 VITALS — BP 142/80 | HR 71 | Temp 98.0°F | Resp 18 | Ht 63.25 in | Wt 118.0 lb

## 2014-04-11 DIAGNOSIS — Z01419 Encounter for gynecological examination (general) (routine) without abnormal findings: Secondary | ICD-10-CM | POA: Insufficient documentation

## 2014-04-11 DIAGNOSIS — Z23 Encounter for immunization: Secondary | ICD-10-CM

## 2014-04-11 DIAGNOSIS — D509 Iron deficiency anemia, unspecified: Secondary | ICD-10-CM

## 2014-04-11 DIAGNOSIS — Z0000 Encounter for general adult medical examination without abnormal findings: Secondary | ICD-10-CM

## 2014-04-11 DIAGNOSIS — M810 Age-related osteoporosis without current pathological fracture: Secondary | ICD-10-CM

## 2014-04-11 DIAGNOSIS — F411 Generalized anxiety disorder: Secondary | ICD-10-CM

## 2014-04-11 DIAGNOSIS — E538 Deficiency of other specified B group vitamins: Secondary | ICD-10-CM

## 2014-04-11 DIAGNOSIS — D508 Other iron deficiency anemias: Secondary | ICD-10-CM

## 2014-04-11 DIAGNOSIS — I10 Essential (primary) hypertension: Secondary | ICD-10-CM

## 2014-04-11 LAB — COMPREHENSIVE METABOLIC PANEL
ALBUMIN: 4.3 g/dL (ref 3.5–5.2)
ALK PHOS: 83 U/L (ref 39–117)
ALT: 24 U/L (ref 0–35)
AST: 32 U/L (ref 0–37)
BUN: 15 mg/dL (ref 6–23)
CALCIUM: 9.4 mg/dL (ref 8.4–10.5)
CHLORIDE: 102 meq/L (ref 96–112)
CO2: 27 mEq/L (ref 19–32)
Creatinine, Ser: 0.5 mg/dL (ref 0.4–1.2)
GFR: 129.83 mL/min (ref >60.00)
Glucose, Bld: 90 mg/dL (ref 70–99)
Potassium: 4.5 mEq/L (ref 3.5–5.1)
SODIUM: 137 meq/L (ref 135–145)
Total Bilirubin: 0.2 mg/dL (ref 0.2–1.2)
Total Protein: 7.3 g/dL (ref 6.0–8.3)

## 2014-04-11 LAB — CBC WITH DIFFERENTIAL/PLATELET
HCT: 32.2 % — ABNORMAL LOW (ref 36.0–46.0)
Hemoglobin: 9.8 g/dL — ABNORMAL LOW (ref 12.0–15.0)
MCHC: 30.5 g/dL (ref 30.0–36.0)
MCV: 72.9 fl — AB (ref 78.0–100.0)
Platelets: 456 10*3/uL — ABNORMAL HIGH (ref 150.0–400.0)
RBC: 4.42 Mil/uL (ref 3.87–5.11)
RDW: 22.7 % — ABNORMAL HIGH (ref 11.5–15.5)
WBC: 5.7 10*3/uL (ref 4.0–10.5)

## 2014-04-11 LAB — LIPID PANEL
Cholesterol: 222 mg/dL — ABNORMAL HIGH (ref 0–200)
HDL: 80.2 mg/dL (ref >39.00)
LDL Cholesterol: 119 mg/dL — ABNORMAL HIGH (ref 0–99)
NonHDL: 141.8
TRIGLYCERIDES: 112 mg/dL (ref 0.0–149.0)
Total CHOL/HDL Ratio: 3
VLDL: 22.4 mg/dL (ref 0.0–40.0)

## 2014-04-11 LAB — FERRITIN: Ferritin: 3 ng/mL — ABNORMAL LOW (ref 10.0–291.0)

## 2014-04-11 LAB — VITAMIN D 25 HYDROXY (VIT D DEFICIENCY, FRACTURES): VITD: 34.91 ng/mL (ref 30.00–100.00)

## 2014-04-11 LAB — VITAMIN B12: Vitamin B-12: 413 pg/mL (ref 211–911)

## 2014-04-11 LAB — TSH: TSH: 1.29 u[IU]/mL (ref 0.35–4.50)

## 2014-04-11 MED ORDER — CYANOCOBALAMIN 1000 MCG/ML IJ SOLN
1000.0000 ug | Freq: Once | INTRAMUSCULAR | Status: AC
  Administered 2014-04-11: 1000 ug via INTRAMUSCULAR

## 2014-04-11 NOTE — Patient Instructions (Addendum)
Take an iron supplement twice daily  Take a calcium supplement, plus [phone] units of vitamin D  Monthly B12 injections    It is important that you exercise regularly, at least 20 minutes 3 to 4 times per week.  If you develop chest pain or shortness of breath seek  medical attention.  Discontinue all alcohol use;  [facility] Adopting a healthy lifestyle and getting preventive care can go a long way to promote health and wellness. Talk with your health care provider about what schedule of regular examinations is right for you. This is a good chance for you to check in with your provider about disease prevention and staying healthy. In between checkups, there are plenty of things you can do on your own. Experts have done a lot of research about which lifestyle changes and preventive measures are most likely to keep you healthy. Ask your health care provider for more information. WEIGHT AND DIET  Eat a healthy diet  Be sure to include plenty of vegetables, fruits, low-fat dairy products, and lean protein.  Do not eat a lot of foods high in solid fats, added sugars, or salt.  Get regular exercise. This is one of the most important things you can do for your health.  Most adults should exercise for at least 150 minutes each week. The exercise should increase your heart rate and make you sweat (moderate-intensity exercise).  Most adults should also do strengthening exercises at least twice a week. This is in addition to the moderate-intensity exercise.  Maintain a healthy weight  Body mass index (BMI) is a measurement that can be used to identify possible weight problems. It estimates body fat based on height and weight. Your health care provider can help determine your BMI and help you achieve or maintain a healthy weight.  For females 1 years of age and older:   A BMI below 18.5 is considered underweight.  A BMI of 18.5 to 24.9  is normal.  A BMI of 25 to 29.9 is considered overweight.  A BMI of 30 and above is considered obese.  Watch levels of cholesterol and blood lipids  You should start having your blood tested for lipids and cholesterol at 62 years of age, then have this test every 5 years.  You may need to have your cholesterol levels checked more often if:  Your lipid or cholesterol levels are high.  You are older than 62 years of age.  You are at high risk for heart disease.  CANCER SCREENING   Lung Cancer  Lung cancer screening is recommended for adults 44-2 years old who are at high risk for lung cancer because of a history of smoking.  A yearly low-dose CT scan of the lungs is recommended for people who:  Currently smoke.  Have quit within the past 15 years.  Have at least a 30-pack-year history of smoking. A pack year is smoking an average of one pack of cigarettes a day for 1 year.  Yearly screening should continue until it has been 15 years since you quit.  Yearly screening should stop if you develop a health problem that would prevent you from having lung cancer treatment.  Breast Cancer  Practice breast self-awareness. This means understanding how your breasts normally appear and feel.  It also means doing regular breast self-exams. Let your health care provider know about any changes, no matter how small.  If you are in your 20s or 30s, you  should have a clinical breast exam (CBE) by a health care provider every 1-3 years as part of a regular health exam.  If you are 84 or older, have a CBE every year. Also consider having a breast X-ray (mammogram) every year.  If you have a family history of breast cancer, talk to your health care provider about genetic screening.  If you are at high risk for breast cancer, talk to your health care provider about having an MRI and a mammogram every year.  Breast cancer gene (BRCA) assessment is recommended for women who have family  members with BRCA-related cancers. BRCA-related cancers include:  Breast.  Ovarian.  Tubal.  Peritoneal cancers.  Results of the assessment will determine the need for genetic counseling and BRCA1 and BRCA2 testing. Cervical Cancer Routine pelvic examinations to screen for cervical cancer are no longer recommended for nonpregnant women who are considered low risk for cancer of the pelvic organs (ovaries, uterus, and vagina) and who do not have symptoms. A pelvic examination may be necessary if you have symptoms including those associated with pelvic infections. Ask your health care provider if a screening pelvic exam is right for you.   The Pap test is the screening test for cervical cancer for women who are considered at risk.  If you had a hysterectomy for a problem that was not cancer or a condition that could lead to cancer, then you no longer need Pap tests.  If you are older than 65 years, and you have had normal Pap tests for the past 10 years, you no longer need to have Pap tests.  If you have had past treatment for cervical cancer or a condition that could lead to cancer, you need Pap tests and screening for cancer for at least 20 years after your treatment.  If you no longer get a Pap test, assess your risk factors if they change (such as having a new sexual partner). This can affect whether you should start being screened again.  Some women have medical problems that increase their chance of getting cervical cancer. If this is the case for you, your health care provider may recommend more frequent screening and Pap tests.  The human papillomavirus (HPV) test is another test that may be used for cervical cancer screening. The HPV test looks for the virus that can cause cell changes in the cervix. The cells collected during the Pap test can be tested for HPV.  The HPV test can be used to screen women 59 years of age and older. Getting tested for HPV can extend the interval  between normal Pap tests from three to five years.  An HPV test also should be used to screen women of any age who have unclear Pap test results.  After 62 years of age, women should have HPV testing as often as Pap tests.  Colorectal Cancer  This type of cancer can be detected and often prevented.  Routine colorectal cancer screening usually begins at 62 years of age and continues through 62 years of age.  Your health care provider may recommend screening at an earlier age if you have risk factors for colon cancer.  Your health care provider may also recommend using home test kits to check for hidden blood in the stool.  A small camera at the end of a tube can be used to examine your colon directly (sigmoidoscopy or colonoscopy). This is done to check for the earliest forms of colorectal cancer.  Routine screening  usually begins at age 35.  Direct examination of the colon should be repeated every 5-10 years through 62 years of age. However, you may need to be screened more often if early forms of precancerous polyps or small growths are found. Skin Cancer  Check your skin from head to toe regularly.  Tell your health care provider about any new moles or changes in moles, especially if there is a change in a mole's shape or color.  Also tell your health care provider if you have a mole that is larger than the size of a pencil eraser.  Always use sunscreen. Apply sunscreen liberally and repeatedly throughout the day.  Protect yourself by wearing long sleeves, pants, a wide-brimmed hat, and sunglasses whenever you are outside. HEART DISEASE, DIABETES, AND HIGH BLOOD PRESSURE   Have your blood pressure checked at least every 1-2 years. High blood pressure causes heart disease and increases the risk of stroke.  If you are between 54 years and 23 years old, ask your health care provider if you should take aspirin to prevent strokes.  Have regular diabetes screenings. This involves  taking a blood sample to check your fasting blood sugar level.  If you are at a normal weight and have a low risk for diabetes, have this test once every three years after 62 years of age.  If you are overweight and have a high risk for diabetes, consider being tested at a younger age or more often. PREVENTING INFECTION  Hepatitis B  If you have a higher risk for hepatitis B, you should be screened for this virus. You are considered at high risk for hepatitis B if:  You were born in a country where hepatitis B is common. Ask your health care provider which countries are considered high risk.  Your parents were born in a high-risk country, and you have not been immunized against hepatitis B (hepatitis B vaccine).  You have HIV or AIDS.  You use needles to inject street drugs.  You live with someone who has hepatitis B.  You have had sex with someone who has hepatitis B.  You get hemodialysis treatment.  You take certain medicines for conditions, including cancer, organ transplantation, and autoimmune conditions. Hepatitis C  Blood testing is recommended for:  Everyone born from [birth year] through 1964.  Anyone with known risk factors for hepatitis C. Sexually transmitted infections (STIs)  You should be screened for sexually transmitted infections (STIs) including gonorrhea and chlamydia if:  You are sexually active and are younger than 62 years of age.  You are older than 62 years of age and your health care provider tells you that you are at risk for this type of infection.  Your sexual activity has changed since you were last screened and you are at an increased risk for chlamydia or gonorrhea. Ask your health care provider if you are at risk.  If you do not have HIV, but are at risk, it may be recommended that you take a prescription medicine daily to prevent HIV infection. This is called pre-exposure prophylaxis (PrEP). You are considered at risk if:  You are sexually active  and do not regularly use condoms or know the HIV status of your partner(s).  You take drugs by injection.  You are sexually active with a partner who has HIV. Talk with your health care provider about whether you are at high risk of being infected with HIV. If you choose to begin PrEP, you should first be tested  for HIV. You should then be tested every 3 months for as long as you are taking PrEP.  PREGNANCY   If you are premenopausal and you may become pregnant, ask your health care provider about preconception counseling.  If you may become pregnant, take 400 to 800 micrograms (mcg) of folic acid every day.  If you want to prevent pregnancy, talk to your health care provider about birth control (contraception). OSTEOPOROSIS AND MENOPAUSE   Osteoporosis is a disease in which the bones lose minerals and strength with aging. This can result in serious bone fractures. Your risk for osteoporosis can be identified using a bone density scan.  If you are 23 years of age or older, or if you are at risk for osteoporosis and fractures, ask your health care provider if you should be screened.  Ask your health care provider whether you should take a calcium or vitamin D supplement to lower your risk for osteoporosis.  Menopause may have certain physical symptoms and risks.  Hormone replacement therapy may reduce some of these symptoms and risks. Talk to your health care provider about whether hormone replacement therapy is right for you.  HOME CARE INSTRUCTIONS   Schedule regular health, dental, and eye exams.  Stay current with your immunizations.   Do not use any tobacco products including cigarettes, chewing tobacco, or electronic cigarettes.  If you are pregnant, do not drink alcohol.  If you are breastfeeding, limit how much and how often you drink alcohol.  Limit alcohol intake to no more than 1 drink per day for nonpregnant women. One drink equals 12 ounces of beer, 5 ounces of wine,  or 1 ounces of hard liquor.  Do not use street drugs.  Do not share needles.  Ask your health care provider for help if you need support or information about quitting drugs.  Tell your health care provider if you often feel depressed.  Tell your health care provider if you have ever been abused or do not feel safe at home. Document Released: 01/20/2011 Document Revised: 11/21/2013 Document Reviewed: 06/08/2013 [facility] Patient Information 2015 [place], [place]. This information is not intended to replace advice given to you by your health care provider. Make sure you discuss any questions you have with your health care provider.

## 2014-04-11 NOTE — Progress Notes (Signed)
Pre visit review using our clinic review tool, if applicable. No additional management support is needed unless otherwise documented below in the visit note. 

## 2014-04-11 NOTE — Progress Notes (Signed)
Subjective:    Patient ID: [name], female    DOB: 23-Aug-1950, 63 y.o.   MRN: [id number]  HPI 63 year-old patient who is in today for a annual exam.  She was seen in the ED 2 months ago with acute alcoholic intoxication.  She was noted to have significant anemia at that time.  She also has a history of B12 deficiency, but has not had any B12 injections since 2012.  She denies an alcohol problem and states that she just had a small glass of wine prior to the ED visit.  Generally feels well today  Alcohol-Tobacco  Smoking Status: never   Allergies:  1) ! Penicillin V Potassium (Penicillin V Potassium)  2) ! * Dilaudid  3) ! 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  HENT: Negative for congestion, dental problem, ear pain, hearing loss, mouth sores, nosebleeds, sinus pressure, sore throat, tinnitus, trouble swallowing and voice change.   Eyes: Negative for photophobia, pain, redness and visual disturbance.  Respiratory: Negative for cough, chest tightness and shortness of breath.   Cardiovascular: Negative for chest pain, palpitations and leg swelling.  Gastrointestinal: Negative for nausea, vomiting, abdominal pain, diarrhea, constipation, blood in stool, abdominal distention and rectal pain.  Genitourinary: Negative for dysuria, urgency, frequency, hematuria, flank pain, vaginal bleeding, vaginal discharge, difficulty urinating, genital sores, vaginal pain, menstrual problem and pelvic pain.    Musculoskeletal: Negative for arthralgias, back pain and neck stiffness.  Skin: Negative for rash.  Neurological: Negative for dizziness, syncope, speech difficulty, weakness, light-headedness, numbness and headaches.  Hematological: Negative for adenopathy. Does not bruise/bleed easily.  Psychiatric/Behavioral: Negative for suicidal ideas, behavioral problems, self-injury, dysphoric mood and agitation. The patient is not nervous/anxious.        Objective:   Physical Exam  Constitutional: She is oriented to person, place, and time. She appears well-developed and well-nourished.  HENT:  Head: Normocephalic and atraumatic.  Right Ear: External ear normal.  Left Ear: External ear normal.  Mouth/Throat: Oropharynx is clear and moist.  Eyes: Conjunctivae and EOM are normal.  Neck: Normal range of motion. Neck supple. No JVD present. No thyromegaly present.  Right supraclavicular bruit  Cardiovascular: Normal rate, regular rhythm, normal heart sounds and intact distal pulses.   No murmur heard. Pulmonary/Chest: Effort normal and breath sounds normal. She has no wheezes. She has no rales.  Abdominal: Soft. Bowel sounds are normal. She exhibits no distension and no mass. There is no tenderness. There is no rebound and no guarding.  Midline surgical scar  Genitourinary: Vagina normal and uterus normal. Guaiac negative stool.  Musculoskeletal: Normal range of motion. She exhibits no edema and no tenderness.  Neurological: She is alert and oriented to person, place, and time. She has normal reflexes. No cranial nerve deficit. She exhibits normal muscle tone. Coordination normal.  Skin: Skin is warm and dry. No rash noted.  Psychiatric: She has a normal mood and affect. Her behavior is normal.          Assessment & Plan:    annual health examination  Alcoholism  B12 deficiency.  Will resume B12 injections  History of anemia.  We'll check a ferritin level iron supplements encouraged   History of osteoporosis.  Patient is unclear of her history of treatment we'll review.  Calcium and vitamin D supplements encouraged  Hypertension controlled   Recheck 3 months

## 2014-04-12 LAB — CYTOLOGY - PAP

## 2014-04-13 ENCOUNTER — Other Ambulatory Visit: Payer: Self-pay | Admitting: Internal Medicine

## 2014-04-13 DIAGNOSIS — D509 Iron deficiency anemia, unspecified: Secondary | ICD-10-CM

## 2014-04-13 DIAGNOSIS — E538 Deficiency of other specified B group vitamins: Secondary | ICD-10-CM

## 2014-05-10 ENCOUNTER — Ambulatory Visit (INDEPENDENT_AMBULATORY_CARE_PROVIDER_SITE_OTHER): Payer: BC Managed Care – PPO | Admitting: *Deleted

## 2014-05-10 ENCOUNTER — Other Ambulatory Visit (INDEPENDENT_AMBULATORY_CARE_PROVIDER_SITE_OTHER): Payer: BC Managed Care – PPO

## 2014-05-10 DIAGNOSIS — E539 Vitamin B deficiency, unspecified: Secondary | ICD-10-CM

## 2014-05-10 DIAGNOSIS — D509 Iron deficiency anemia, unspecified: Secondary | ICD-10-CM

## 2014-05-10 DIAGNOSIS — E538 Deficiency of other specified B group vitamins: Secondary | ICD-10-CM

## 2014-05-10 LAB — CBC WITH DIFFERENTIAL/PLATELET
HEMATOCRIT: 33.4 % — AB (ref 36.0–46.0)
Hemoglobin: 10.2 g/dL — ABNORMAL LOW (ref 12.0–15.0)
MCHC: 30.7 g/dL (ref 30.0–36.0)
MCV: 75.6 fl — ABNORMAL LOW (ref 78.0–100.0)
PLATELETS: 391 10*3/uL (ref 150.0–400.0)
RBC: 4.42 Mil/uL (ref 3.87–5.11)
RDW: 23.2 % — AB (ref 11.5–15.5)
WBC: 5.4 10*3/uL (ref 4.0–10.5)

## 2014-05-10 MED ORDER — CYANOCOBALAMIN 1000 MCG/ML IJ SOLN
1000.0000 ug | Freq: Once | INTRAMUSCULAR | Status: AC
  Administered 2014-05-10: 1000 ug via INTRAMUSCULAR

## 2014-05-11 ENCOUNTER — Ambulatory Visit: Payer: BC Managed Care – PPO | Admitting: *Deleted

## 2014-06-06 ENCOUNTER — Other Ambulatory Visit: Payer: Self-pay | Admitting: Internal Medicine

## 2014-07-11 ENCOUNTER — Ambulatory Visit (INDEPENDENT_AMBULATORY_CARE_PROVIDER_SITE_OTHER): Payer: BC Managed Care – PPO | Admitting: Internal Medicine

## 2014-07-11 ENCOUNTER — Encounter: Payer: Self-pay | Admitting: Internal Medicine

## 2014-07-11 VITALS — BP 140/68 | HR 78 | Temp 98.2°F | Resp 18 | Ht 63.25 in | Wt 106.0 lb

## 2014-07-11 DIAGNOSIS — F411 Generalized anxiety disorder: Secondary | ICD-10-CM

## 2014-07-11 DIAGNOSIS — F1020 Alcohol dependence, uncomplicated: Secondary | ICD-10-CM

## 2014-07-11 DIAGNOSIS — E538 Deficiency of other specified B group vitamins: Secondary | ICD-10-CM

## 2014-07-11 DIAGNOSIS — D509 Iron deficiency anemia, unspecified: Secondary | ICD-10-CM

## 2014-07-11 DIAGNOSIS — I10 Essential (primary) hypertension: Secondary | ICD-10-CM

## 2014-07-11 MED ORDER — CYANOCOBALAMIN 1000 MCG/ML IJ SOLN
1000.0000 ug | Freq: Once | INTRAMUSCULAR | Status: AC
  Administered 2014-07-11: 1000 ug via INTRAMUSCULAR

## 2014-07-11 NOTE — Progress Notes (Signed)
Pre visit review using our clinic review tool, if applicable. No additional management support is needed unless otherwise documented below in the visit note. 

## 2014-07-11 NOTE — Patient Instructions (Signed)
Take a calcium supplement, plus [phone] units of vitamin D  Iron 325 mg twice daily  Vitamin B12 1 mg daily

## 2014-07-11 NOTE — Progress Notes (Signed)
Subjective:    Patient ID: [name], female    DOB: [birth year], 63 y.o.   MRN: [id number]  HPI  Wt Readings from Last 3 Encounters:  07/11/14 106 lb (48.081 kg)  04/11/14 118 lb (53.524 kg)  02/01/14 115 lb (52.6164 kg)   63 year old patient who is seen today for general follow-up.  She does have history of alcoholism but states that she has been abstinent since her last visit here.  She has a history also of anxiety and hypertension.  No new concerns or complaints.  She feels quite well today.  There has been some weight loss but again generally feels quite well.  Past Medical History  Diagnosis Date  . Alcoholism 02/02/2009  . ANXIETY 01/21/2007  . DIVERTICULOSIS 07/09/2007  . Duodenal ulcer hemorrhage perforated 2006  . HYPERTENSION 06/23/2007  . HYPONATREMIA 02/02/2009    better off diuretic  . INSOMNIA 05/31/2007  . OSTEOPOROSIS 01/21/2007  . Raynaud's syndrome 06/23/2007  . VITAMIN B12 DEFICIENCY 10/02/2009  . Zoster 2012    twice - left groin/vagina  . NSAID-induced duodenal ulcer   . Blood transfusion     hx of 2005   . Headache(784.0) 05/31/2007    pt denies at 08/12/11 preop visit   . Iron deficiency anemia     pt denies at 08/12/11 visit   . Recurrent upper respiratory infection (URI)     pt reports slight cold using Nyquil prn at bedtime     Review of Systems  Constitutional: Positive for unexpected weight change.  HENT: Negative for congestion, dental problem, hearing loss, rhinorrhea, sinus pressure, sore throat and tinnitus.   Eyes: Negative for pain, discharge and visual disturbance.  Respiratory: Negative for cough and shortness of breath.   Cardiovascular: Negative for chest pain, palpitations and leg swelling.  Gastrointestinal: Negative for nausea, vomiting, abdominal pain, diarrhea, constipation, blood in stool and abdominal distention.  Genitourinary: Negative for dysuria, urgency, frequency, hematuria, flank pain, vaginal bleeding, vaginal discharge,  difficulty urinating, vaginal pain and pelvic pain.  Musculoskeletal: Negative for joint swelling, arthralgias and gait problem.  Skin: Negative for rash.  Neurological: Negative for dizziness, syncope, speech difficulty, weakness, numbness and headaches.  Hematological: Negative for adenopathy.  Psychiatric/Behavioral: Negative for behavioral problems, dysphoric mood and agitation. The patient is not nervous/anxious.        Objective:   Physical Exam  Constitutional: She is oriented to person, place, and time. She appears well-developed and well-nourished.  HENT:  Head: Normocephalic.  Right Ear: External ear normal.  Left Ear: External ear normal.  Mouth/Throat: Oropharynx is clear and moist.  Edentulous  Eyes: Conjunctivae and EOM are normal. Pupils are equal, round, and reactive to light.  Neck: Normal range of motion. Neck supple. No thyromegaly present.  Cardiovascular: Normal rate, regular rhythm, normal heart sounds and intact distal pulses.   Pulmonary/Chest: Effort normal and breath sounds normal.  Abdominal: Soft. Bowel sounds are normal. She exhibits no mass. There is no tenderness.  Musculoskeletal: Normal range of motion.  Lymphadenopathy:    She has no cervical adenopathy.  Neurological: She is alert and oriented to person, place, and time.  Skin: Skin is warm and dry. No rash noted.  Psychiatric: She has a normal mood and affect. Her behavior is normal.          Assessment & Plan:   Alcoholism.  Appears to be abstinent Hypertension, well-controlled History of iron deficiency anemia.  Will check a CBC History of B12 deficiency.  We'll give vitamin B12 1 mg IM.  Daily oral supplements encouraged  Recheck 6 months or as needed

## 2014-07-11 NOTE — Progress Notes (Signed)
   Subjective:    Patient ID: [name], female    DOB: 1950/09/15, 63 y.o.   MRN: [id number]  HPI     Review of Systems     Objective:   Physical Exam        Assessment & Plan:

## 2014-07-15 ENCOUNTER — Ambulatory Visit (INDEPENDENT_AMBULATORY_CARE_PROVIDER_SITE_OTHER): Payer: BC Managed Care – PPO | Admitting: Family Medicine

## 2014-07-15 ENCOUNTER — Ambulatory Visit (INDEPENDENT_AMBULATORY_CARE_PROVIDER_SITE_OTHER): Payer: BC Managed Care – PPO

## 2014-07-15 VITALS — BP 170/80 | HR 75 | Temp 98.0°F | Resp 16 | Ht 64.0 in | Wt 107.2 lb

## 2014-07-15 DIAGNOSIS — M546 Pain in thoracic spine: Secondary | ICD-10-CM

## 2014-07-15 DIAGNOSIS — I10 Essential (primary) hypertension: Secondary | ICD-10-CM

## 2014-07-15 DIAGNOSIS — N949 Unspecified condition associated with female genital organs and menstrual cycle: Secondary | ICD-10-CM

## 2014-07-15 LAB — POCT UA - MICROSCOPIC ONLY
BACTERIA, U MICROSCOPIC: NEGATIVE
Casts, Ur, LPF, POC: NEGATIVE
Crystals, Ur, HPF, POC: NEGATIVE
Mucus, UA: NEGATIVE
RBC, urine, microscopic: NEGATIVE
WBC, Ur, HPF, POC: NEGATIVE
Yeast, UA: NEGATIVE

## 2014-07-15 LAB — POCT URINALYSIS DIPSTICK
Bilirubin, UA: NEGATIVE
Blood, UA: NEGATIVE
Glucose, UA: NEGATIVE
Ketones, UA: NEGATIVE
LEUKOCYTES UA: NEGATIVE
Nitrite, UA: NEGATIVE
PROTEIN UA: 30
Spec Grav, UA: 1.015
UROBILINOGEN UA: 0.2
pH, UA: 7

## 2014-07-15 MED ORDER — METHOCARBAMOL 500 MG PO TABS: 500.0000 mg | ORAL_TABLET | Freq: Four times a day (QID) | ORAL | Status: DC | PRN

## 2014-07-15 MED ORDER — MELOXICAM 7.5 MG PO TABS: 7.5000 mg | ORAL_TABLET | Freq: Every day | ORAL | Status: DC

## 2014-07-15 NOTE — Progress Notes (Addendum)
This chart was scribed for Kirsten Chick, MD by Luisa Dago, ED Scribe. This patient was seen in room 10 and the patient's care was started at 3:18 PM.  Subjective:    Patient ID: [name], female    DOB: [birth year], 63 y.o.   MRN: [id number]  07/15/2014  possible shingles and Back Pain   HPI [name] is a 63 y.o. female with PMhx of shingles and back pain.  Pt comes into the office concerned for an area of redness to vaginal area. Pt states that she noticed the affected area a couple of days ago. [name] was diagnosed with shingles not too long ago, she was unable to specify the time. Her past diagnosis of shingles was also noted to be to her vaginal area. Today, she comes in concerned that this may be a recurrent episode of shingles. However, she notes that  the current area of erythema is not similar to the rash she experienced when she was diagnosed with shingles. With the shingles, the rash presented as several blisters, however, with this episode she only has an isolated area of redness. [name] also endorses associated back pain which she states was a symptom she also experienced with her previous diagnosis of shingles. She does express a slight difference with this current back discomfort. She states that the pain is higher up than her previous episode. Pt is happily married. She denies any fever, chills, nausea, emesis, abdominal pain, congestion, SOB, vaginal discharge, vaginal bleeding, swelling, urinary leakage, hematuria, urgency or dysuria. Denies radiation of back pain into arms or legs; denies n/t/w. In extremities. Denies saddle paresthesias or b/b dysfunction.  She did notice several bowel movements one day when she took Pseudoephedrine for sinus congestion; she had multiple bowel movements the day before onset of genital lesion.  Review of Systems  Constitutional: Negative for fever, chills, diaphoresis and fatigue.  HENT: Negative for congestion.     Respiratory: Negative for shortness of breath.   Gastrointestinal: Negative for nausea, vomiting, abdominal pain and diarrhea.  Genitourinary: Positive for genital sores and vaginal pain. Negative for dysuria, urgency, frequency, hematuria, flank pain, decreased urine volume, vaginal bleeding, vaginal discharge, enuresis, difficulty urinating, pelvic pain and dyspareunia.  Musculoskeletal: Positive for myalgias and back pain.  Skin: Positive for rash.  Neurological: Negative for weakness and numbness.    Past Medical History  Diagnosis Date  . Alcoholism 02/02/2009  . ANXIETY 01/21/2007  . DIVERTICULOSIS 07/09/2007  . Duodenal ulcer hemorrhage perforated 2006  . HYPERTENSION 06/23/2007  . HYPONATREMIA 02/02/2009    better off diuretic  . INSOMNIA 05/31/2007  . OSTEOPOROSIS 01/21/2007  . Raynaud's syndrome 06/23/2007  . VITAMIN B12 DEFICIENCY 10/02/2009  . Zoster 2012    twice - left groin/vagina  . NSAID-induced duodenal ulcer   . Blood transfusion     hx of 2005   . Headache(784.0) 05/31/2007    pt denies at 08/12/11 preop visit   . Iron deficiency anemia     pt denies at 08/12/11 visit   . Recurrent upper respiratory infection (URI)     pt reports slight cold using Nyquil prn at bedtime    Past Surgical History  Procedure Laterality Date  . Tubal ligation  1977  . Upper gastrointestinal endoscopy  09/16/2007; 12/21/2008; 03/17/2011    2009: Normal 2010: Normal with enteroscopy2012: Gastrojejunal  anastomotic ulcer, gastric retention  . Colonoscopy  06/18/2007    angulated and stenotic sigmoid colon with severe sigmoid  diverticulosis  . Exploratory laparotomy  08/03/2004    1) duodenal ulcer oversew, pyloroplasty, anterior, posterior and truncal vagotomies  . Exploratory laparotomy  10/20/2004    1) duodenal ulcer oversew  with exclusion2) side-side gastrojejunostomy 3) feeding jejunostomy  . Laparotomy  08/14/2011    Procedure: EXPLORATORY LAPAROTOMY;  Surgeon: Kandis Cocking, MD;   Location: [facility];  Service: General;  Laterality: N/A;  exploratory laparotomy, lysis of adhesions, revision gastrojejunostomy, upper endoscopy  . Laparoscopy  08/14/2011    Procedure: LAPAROSCOPY DIAGNOSTIC;  Surgeon: Kandis Cocking, MD;  Location: [facility];  Service: General;  Laterality: N/A;   Allergies  Allergen Reactions  . Sulfonamide Derivatives Anaphylaxis and Rash  . Penicillins     REACTION: unspecified  . Hydromorphone Hcl Rash   Current Outpatient Prescriptions  Medication Sig Dispense Refill  . acetaminophen (TYLENOL) 500 MG tablet Take 500 mg by mouth every 6 (six) hours as needed for mild pain.     Marland Kitchen ALPRAZolam (XANAX) 0.5 MG tablet take 1 tablet by mouth three times a day 60 tablet 2  . Ca Carbonate-Mag Hydroxide (ROLAIDS PO) Take 2 tablets by mouth daily as needed (indigestion).    Marland Kitchen lisinopril (PRINIVIL,ZESTRIL) 20 MG tablet Take 1 tablet (20 mg total) by mouth daily with breakfast. 90 tablet 3  . meloxicam (MOBIC) 7.5 MG tablet Take 1 tablet (7.5 mg total) by mouth daily. 30 tablet 0  . methocarbamol (ROBAXIN) 500 MG tablet Take 1 tablet (500 mg total) by mouth every 6 (six) hours as needed for muscle spasms. 40 tablet 0   No current facility-administered medications for this visit.       Objective:    Triage Vitals:BP 170/80 mmHg  Pulse 75  Temp(Src) 98 F (36.7 C) (Oral)  Resp 16  Ht 5\' 4"  (1.626 m)  Wt 107 lb 3.2 oz (48.626 kg)  BMI 18.39 kg/m2  SpO2 99%  Physical Exam  Constitutional: She is oriented to person, place, and time. She appears well-developed and well-nourished. No distress.  HENT:  Head: Normocephalic and atraumatic.  Eyes: Conjunctivae and EOM are normal. Pupils are equal, round, and reactive to light.  Neck: Neck supple. No thyromegaly present.  Cardiovascular: Normal rate, regular rhythm and normal heart sounds.   Pulmonary/Chest: Effort normal and breath sounds normal. No respiratory distress. She has no wheezes. She has no rales.    Abdominal: Soft. Bowel sounds are normal. She exhibits no distension and no mass. There is no tenderness. There is no rebound, no guarding and no CVA tenderness.  Genitourinary: Uterus normal.    There is no rash, tenderness or lesion on the right labia. There is no rash, tenderness or lesion on the left labia. Cervix exhibits no motion tenderness, no discharge and no friability. Right adnexum displays no mass, no tenderness and no fullness. Left adnexum displays no mass, no tenderness and no fullness. No vaginal discharge found.  2 mm x 3 mm superficial ulceration lesion versus abrasion present.  Musculoskeletal: Normal range of motion.       Cervical back: Normal. She exhibits normal range of motion, no tenderness, no bony tenderness, no swelling, no edema, no deformity, no laceration, no pain and no spasm.       Thoracic back: She exhibits pain. She exhibits normal range of motion, no tenderness, no bony tenderness, no spasm and normal pulse.       Lumbar back: Normal. She exhibits normal range of motion, no tenderness, no bony tenderness, no swelling,  no edema, no deformity, no laceration, no pain and no spasm.  Lymphadenopathy:    She has no cervical adenopathy.  Neurological: She is alert and oriented to person, place, and time.  Skin: Skin is warm and dry.  Psychiatric: She has a normal mood and affect. Her behavior is normal.  Nursing note and vitals reviewed.   Results for orders placed or performed in visit on 07/15/14  RPR  Result Value Ref Range   RPR NON REAC NON REAC  POCT urinalysis dipstick  Result Value Ref Range   Color, UA yellow    Clarity, UA clear    Glucose, UA neg    Bilirubin, UA neg    Ketones, UA neg    Spec Grav, UA 1.015    Blood, UA neg    pH, UA 7.0    Protein, UA 30    Urobilinogen, UA 0.2    Nitrite, UA neg    Leukocytes, UA Negative   POCT UA - Microscopic Only  Result Value Ref Range   WBC, Ur, HPF, POC neg    RBC, urine, microscopic neg     Bacteria, U Microscopic neg    Mucus, UA neg    Epithelial cells, urine per micros 0-1    Crystals, Ur, HPF, POC neg    Casts, Ur, LPF, POC neg    Yeast, UA neg      [facility] reading (PRIMARY) by  Dr. Katrinka Blazing.  THORACIC SPINE:  NAD      Assessment & Plan:   1. Genital lesion, female   2. Left-sided thoracic back pain   3. Essential hypertension, benign     1.  Genital lesion: New . Ddx includes ulcerative lesion single versus skin breakdown from frequent stools and wiping. HSV culture and RPR obtained.  Treat with topical barrier cream such as vaseline, desitin, or A&D ointment.   2.  Thoracic back pain: New.  Suggestive of musculoskeletal strain.  Treat with Meloxicam, Robaxin. Recommend stretching and frequent ambulation. Avoid heavy lifting for two weeks. 3. HTN: with elevated readings; recommend checking BP daily at home.   Meds ordered this encounter  Medications  . methocarbamol (ROBAXIN) 500 MG tablet    Sig: Take 1 tablet (500 mg total) by mouth every 6 (six) hours as needed for muscle spasms.    Dispense:  40 tablet    Refill:  0  . meloxicam (MOBIC) 7.5 MG tablet    Sig: Take 1 tablet (7.5 mg total) by mouth daily.    Dispense:  30 tablet    Refill:  0    No Follow-up on file.    I personally performed the services described in this documentation, which was scribed in my presence. The recorded information has been reviewed and considered.   Nilda Simmer, M.D. [facility] [address] [place], [place]  [address] [phone] phone [phone] fax

## 2014-07-16 LAB — RPR

## 2014-07-23 LAB — HERPES CULTURE, RAPID

## 2014-09-06 ENCOUNTER — Other Ambulatory Visit: Payer: Self-pay | Admitting: Internal Medicine

## 2014-10-03 ENCOUNTER — Other Ambulatory Visit: Payer: Self-pay | Admitting: Internal Medicine

## 2014-10-03 NOTE — Telephone Encounter (Signed)
Filled once.  Pt has upcoming appt on 10/10/14 to see Dr. Kirtland BouchardK.

## 2014-10-10 ENCOUNTER — Ambulatory Visit (INDEPENDENT_AMBULATORY_CARE_PROVIDER_SITE_OTHER): Payer: 59 | Admitting: Internal Medicine

## 2014-10-10 ENCOUNTER — Encounter: Payer: Self-pay | Admitting: Internal Medicine

## 2014-10-10 VITALS — BP 140/72 | HR 67 | Temp 98.0°F | Resp 18 | Ht 64.0 in | Wt 104.0 lb

## 2014-10-10 DIAGNOSIS — I10 Essential (primary) hypertension: Secondary | ICD-10-CM

## 2014-10-10 DIAGNOSIS — F411 Generalized anxiety disorder: Secondary | ICD-10-CM | POA: Diagnosis not present

## 2014-10-10 DIAGNOSIS — D509 Iron deficiency anemia, unspecified: Secondary | ICD-10-CM | POA: Diagnosis not present

## 2014-10-10 LAB — CBC WITH DIFFERENTIAL/PLATELET
Basophils Relative: 1 % (ref 0.0–3.0)
EOS PCT: 0 % (ref 0.0–5.0)
HCT: 28.3 % — ABNORMAL LOW (ref 36.0–46.0)
HEMOGLOBIN: 9.2 g/dL — AB (ref 12.0–15.0)
LYMPHS PCT: 17 % (ref 12.0–46.0)
MCHC: 32.4 g/dL (ref 30.0–36.0)
MCV: 77.6 fl — ABNORMAL LOW (ref 78.0–100.0)
Monocytes Relative: 10 % (ref 3.0–12.0)
Neutrophils Relative %: 72 % (ref 43.0–77.0)
PLATELETS: 414 10*3/uL — AB (ref 150.0–400.0)
RBC: 3.65 Mil/uL — ABNORMAL LOW (ref 3.87–5.11)
RDW: 16.9 % — ABNORMAL HIGH (ref 11.5–15.5)
WBC: 5.8 10*3/uL (ref 4.0–10.5)

## 2014-10-10 MED ORDER — METHOCARBAMOL 500 MG PO TABS: 500.0000 mg | ORAL_TABLET | Freq: Four times a day (QID) | ORAL | Status: DC | PRN

## 2014-10-10 NOTE — Progress Notes (Signed)
Subjective:    Patient ID: [name], female    DOB: [birth year], 64 y.o.   MRN: [id number]  HPI  Wt Readings from Last 3 Encounters:  10/10/14 104 lb (47.174 kg)  07/15/14 107 lb 3.2 oz (48.626 kg)  07/11/14 106 lb (48.24081 kg)    64 year old patient who has a history of iron deficiency anemia as well as B12 deficiency.  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  She has a history of alcoholism.  She was seen at the urgent care with left-sided back pain that responded well to Robaxin.  She is requesting a refill. She has not been taking her iron or B12 supplements on a regular basis.  She generally feels well No history of nausea or vomiting.  Her weight is down modestly but only complaint today is back pain and spasm.  She has treated hypertension which has been stable.  Past Medical History  Diagnosis Date  . Alcoholism 02/02/2009  . ANXIETY 01/21/2007  . DIVERTICULOSIS 07/09/2007  . Duodenal ulcer hemorrhage perforated 2006  . HYPERTENSION 06/23/2007  . HYPONATREMIA 02/02/2009    better off diuretic  . INSOMNIA 05/31/2007  . OSTEOPOROSIS 01/21/2007  . Raynaud's syndrome 06/23/2007  . VITAMIN B12 DEFICIENCY 10/02/2009  . Zoster 2012    twice - left groin/vagina  . NSAID-induced duodenal ulcer   . Blood transfusion     hx of 2005   . Headache(784.0) 05/31/2007    pt denies at 08/12/11 preop visit   . Iron deficiency anemia     pt denies at 08/12/11 visit   . Recurrent upper respiratory infection (URI)     pt reports slight cold using Nyquil prn at bedtime     History   Social History  . Marital Status: Married    Spouse Name: N/A  . Number of Children: 1  . Years of Education: N/A   Occupational History  . FT student    Social History Main Topics  . Smoking status: Never Smoker   . Smokeless tobacco: Never Used  . Alcohol Use: No  . Drug Use: No  . Sexual Activity: Not on file   Other Topics Concern  . Not on file   Social  History Narrative    Past Surgical History  Procedure Laterality Date  . Tubal ligation  1977  . Upper gastrointestinal endoscopy  09/16/2007; 12/21/2008; 03/17/2011    2009: Normal 2010: Normal with enteroscopy2012: Gastrojejunal  anastomotic ulcer, gastric retention  . Colonoscopy  06/18/2007    angulated and stenotic sigmoid colon with severe sigmoid diverticulosis  . Exploratory laparotomy  08/03/2004    1) duodenal ulcer oversew, pyloroplasty, anterior, posterior and truncal vagotomies  . Exploratory laparotomy  10/20/2004    1) duodenal ulcer oversew  with exclusion2) side-side gastrojejunostomy 3) feeding jejunostomy  . Laparotomy  08/14/2011    Procedure: EXPLORATORY LAPAROTOMY;  Surgeon: Kandis Cockingavid H Newman, MD;  Location: [facility];  Service: General;  Laterality: N/A;  exploratory laparotomy, lysis of adhesions, revision gastrojejunostomy, upper endoscopy  . Laparoscopy  08/14/2011    Procedure: LAPAROSCOPY DIAGNOSTIC;  Surgeon: Kandis Cockingavid H Newman, MD;  Location: [facility];  Service: General;  Laterality: N/A;    Family History  Problem Relation Age of Onset  . Stroke Father     Allergies  Allergen Reactions  . Sulfonamide Derivatives Anaphylaxis and Rash  . Penicillins     REACTION: unspecified  . Hydromorphone Hcl Rash    Current Outpatient  Prescriptions on File Prior to Visit  Medication Sig Dispense Refill  . acetaminophen (TYLENOL) 500 MG tablet Take 500 mg by mouth every 6 (six) hours as needed for mild pain.     Marland Kitchen ALPRAZolam (XANAX) 0.5 MG tablet take 1 tablet by mouth three times a day 60 tablet 2  . Ca Carbonate-Mag Hydroxide (ROLAIDS PO) Take 2 tablets by mouth daily as needed (indigestion).    Marland Kitchen lisinopril (PRINIVIL,ZESTRIL) 20 MG tablet take 1 tablet by mouth once daily WITH BREAKFAST 90 tablet 0  . meloxicam (MOBIC) 7.5 MG tablet Take 1 tablet (7.5 mg total) by mouth daily. (Patient not taking: Reported on 10/10/2014) 30 tablet 0  . methocarbamol (ROBAXIN) 500 MG tablet Take 1  tablet (500 mg total) by mouth every 6 (six) hours as needed for muscle spasms. (Patient not taking: Reported on 10/10/2014) 40 tablet 0   No current facility-administered medications on file prior to visit.    BP 140/72 mmHg  Pulse 67  Temp(Src) 98 F (36.7 C) (Oral)  Resp 18  Ht  (1.626 m)  Wt 104 lb (47.174 kg)  BMI 17.84 kg/m2  SpO2 98%     Review of Systems  Constitutional: Negative.   HENT: Negative for congestion, dental problem, hearing loss, rhinorrhea, sinus pressure, sore throat and tinnitus.   Eyes: Negative for pain, discharge and visual disturbance.  Respiratory: Negative for cough and shortness of breath.   Cardiovascular: Negative for chest pain, palpitations and leg swelling.  Gastrointestinal: Negative for nausea, vomiting, abdominal pain, diarrhea, constipation, blood in stool and abdominal distention.  Genitourinary: Negative for dysuria, urgency, frequency, hematuria, flank pain, vaginal bleeding, vaginal discharge, difficulty urinating, vaginal pain and pelvic pain.  Musculoskeletal: Positive for back pain. Negative for joint swelling, arthralgias and gait problem.  Skin: Negative for rash.  Neurological: Negative for dizziness, syncope, speech difficulty, weakness, numbness and headaches.  Hematological: Negative for adenopathy.  Psychiatric/Behavioral: Negative for behavioral problems, dysphoric mood and agitation. The patient is not nervous/anxious.        Objective:   Physical Exam  Constitutional: She is oriented to person, place, and time. She appears well-developed and well-nourished.  HENT:  Head: Normocephalic.  Right Ear: External ear normal.  Left Ear: External ear normal.  Mouth/Throat: Oropharynx is clear and moist.  Eyes: Conjunctivae and EOM are normal. Pupils are equal, round, and reactive to light.  Neck: Normal range of motion. Neck supple. No thyromegaly present.  Cardiovascular: Normal rate, regular rhythm, normal heart sounds  and intact distal pulses.   Pulmonary/Chest: Effort normal and breath sounds normal.  Abdominal: Soft. Bowel sounds are normal. She exhibits no distension and no mass. There is no tenderness. There is no rebound and no guarding.  Musculoskeletal: Normal range of motion.  Lymphadenopathy:    She has no cervical adenopathy.  Neurological: She is alert and oriented to person, place, and time.  Skin: Skin is warm and dry. No rash noted.  Psychiatric: She has a normal mood and affect. Her behavior is normal.          Assessment & Plan:   Essential hypertension, stable History of B12 deficiency History of iron deficiency anemia.  Will check a CBC.  Compliance with her vitamin B-12 supplements and iron supplements encouraged Back pain.  Robaxin refilled  Recheck 3 months

## 2014-10-10 NOTE — Progress Notes (Signed)
Pre visit review using our clinic review tool, if applicable. No additional management support is needed unless otherwise documented below in the visit note. 

## 2014-10-10 NOTE — Patient Instructions (Signed)
Take your iron supplements and vitamin B12 daily  Take a calcium supplement, plus [phone] units of vitamin D    It is important that you exercise regularly, at least 20 minutes 3 to 4 times per week.  If you develop chest pain or shortness of breath seek  medical attention.  Return in 3 months for follow-up

## 2014-12-11 ENCOUNTER — Telehealth: Payer: Self-pay | Admitting: Family Medicine

## 2014-12-11 NOTE — Telephone Encounter (Signed)
Left a message for the pt to return my call.  Need to see if she has had a recent mammogram. 

## 2014-12-22 ENCOUNTER — Other Ambulatory Visit: Payer: Self-pay | Admitting: Internal Medicine

## 2015-01-10 ENCOUNTER — Ambulatory Visit: Payer: 59 | Admitting: Internal Medicine

## 2015-01-17 ENCOUNTER — Ambulatory Visit (INDEPENDENT_AMBULATORY_CARE_PROVIDER_SITE_OTHER): Payer: 59 | Admitting: Internal Medicine

## 2015-01-17 ENCOUNTER — Encounter: Payer: Self-pay | Admitting: Internal Medicine

## 2015-01-17 ENCOUNTER — Other Ambulatory Visit: Payer: Self-pay | Admitting: Internal Medicine

## 2015-01-17 VITALS — BP 140/74 | HR 93 | Temp 98.4°F | Resp 20 | Ht 64.0 in | Wt 106.0 lb

## 2015-01-17 DIAGNOSIS — I10 Essential (primary) hypertension: Secondary | ICD-10-CM

## 2015-01-17 DIAGNOSIS — L740 Miliaria rubra: Secondary | ICD-10-CM | POA: Diagnosis not present

## 2015-01-17 MED ORDER — METHOCARBAMOL 500 MG PO TABS: 500.0000 mg | ORAL_TABLET | Freq: Four times a day (QID) | ORAL | Status: DC | PRN

## 2015-01-17 MED ORDER — KETOCONAZOLE 2 % EX CREA: 1.0000 "application " | TOPICAL_CREAM | Freq: Two times a day (BID) | CUTANEOUS | Status: DC

## 2015-01-17 NOTE — Progress Notes (Signed)
Subjective:    Patient ID: [name], female    DOB: [birth year], 64 y.o.   MRN: [id number]  HPI 64 year old patient who presents with a one-week history of a very uncomfortable.  Perineal dermatitis.  She has been using Neosporin and zinc oxide without much benefit. She has a history of essential hypertension. Otherwise, doing quite well.  Wt Readings from Last 3 Encounters:  01/17/15 106 lb (48.081 kg)  10/10/14 104 lb (47.174 kg)  07/15/14 107 lb 3.2 oz (48.626 kg)    Past Medical History  Diagnosis Date  . Alcoholism 02/02/2009  . ANXIETY 01/21/2007  . DIVERTICULOSIS 07/09/2007  . Duodenal ulcer hemorrhage perforated 2006  . HYPERTENSION 06/23/2007  . HYPONATREMIA 02/02/2009    better off diuretic  . INSOMNIA 05/31/2007  . OSTEOPOROSIS 01/21/2007  . Raynaud's syndrome 06/23/2007  . VITAMIN B12 DEFICIENCY 10/02/2009  . Zoster 2012    twice - left groin/vagina  . NSAID-induced duodenal ulcer   . Blood transfusion     hx of 2005   . Headache(784.0) 05/31/2007    pt denies at 08/12/11 preop visit   . Iron deficiency anemia     pt denies at 08/12/11 visit   . Recurrent upper respiratory infection (URI)     pt reports slight cold using Nyquil prn at bedtime     History   Social History  . Marital Status: Married    Spouse Name: N/A  . Number of Children: 1  . Years of Education: N/A   Occupational History  . FT student    Social History Main Topics  . Smoking status: Never Smoker   . Smokeless tobacco: Never Used  . Alcohol Use: No  . Drug Use: No  . Sexual Activity: Not on file   Other Topics Concern  . Not on file   Social History Narrative    Past Surgical History  Procedure Laterality Date  . Tubal ligation  1977  . Upper gastrointestinal endoscopy  09/16/2007; 12/21/2008; 03/17/2011    2009: Normal 2010: Normal with enteroscopy2012: Gastrojejunal  anastomotic ulcer, gastric retention  . Colonoscopy  06/18/2007    angulated and stenotic sigmoid colon with  severe sigmoid diverticulosis  . Exploratory laparotomy  08/03/2004    1) duodenal ulcer oversew, pyloroplasty, anterior, posterior and truncal vagotomies  . Exploratory laparotomy  10/20/2004    1) duodenal ulcer oversew  with exclusion2) side-side gastrojejunostomy 3) feeding jejunostomy  . Laparotomy  08/14/2011    Procedure: EXPLORATORY LAPAROTOMY;  Surgeon: Kandis Cocking, MD;  Location: [facility];  Service: General;  Laterality: N/A;  exploratory laparotomy, lysis of adhesions, revision gastrojejunostomy, upper endoscopy  . Laparoscopy  08/14/2011    Procedure: LAPAROSCOPY DIAGNOSTIC;  Surgeon: Kandis Cocking, MD;  Location: [facility];  Service: General;  Laterality: N/A;    Family History  Problem Relation Age of Onset  . Stroke Father     Allergies  Allergen Reactions  . Sulfonamide Derivatives Anaphylaxis and Rash  . Penicillins     REACTION: unspecified  . Hydromorphone Hcl Rash    Current Outpatient Prescriptions on File Prior to Visit  Medication Sig Dispense Refill  . acetaminophen (TYLENOL) 500 MG tablet Take 500 mg by mouth every 6 (six) hours as needed for mild pain.     Marland Kitchen ALPRAZolam (XANAX) 0.5 MG tablet TAKE 1 TABLET 3 TIMES DAILY AS NEEDED. 60 tablet 2  . Ca Carbonate-Mag Hydroxide (ROLAIDS PO) Take 2 tablets by mouth daily as needed (indigestion).    Marland Kitchen  lisinopril (PRINIVIL,ZESTRIL) 20 MG tablet take 1 tablet by mouth once daily WITH BREAKFAST 90 tablet 0  . meloxicam (MOBIC) 7.5 MG tablet Take 1 tablet (7.5 mg total) by mouth daily. 30 tablet 0  . methocarbamol (ROBAXIN) 500 MG tablet Take 1 tablet (500 mg total) by mouth every 6 (six) hours as needed for muscle spasms. 40 tablet 0   No current facility-administered medications on file prior to visit.    BP 140/74 mmHg  Pulse 93  Temp(Src) 98.4 F (36.9 C) (Oral)  Resp 20  Ht 5\' 4"  (1.626 m)  Wt 106 lb (48.081 kg)  BMI 18.19 kg/m2  SpO2 98%      Review of Systems  Constitutional: Negative.   HENT: Negative  for congestion, dental problem, hearing loss, rhinorrhea, sinus pressure, sore throat and tinnitus.   Eyes: Negative for pain, discharge and visual disturbance.  Respiratory: Negative for cough and shortness of breath.   Cardiovascular: Negative for chest pain, palpitations and leg swelling.  Gastrointestinal: Negative for nausea, vomiting, abdominal pain, diarrhea, constipation, blood in stool and abdominal distention.  Genitourinary: Negative for dysuria, urgency, frequency, hematuria, flank pain, vaginal bleeding, vaginal discharge, difficulty urinating, vaginal pain and pelvic pain.  Musculoskeletal: Negative for joint swelling, arthralgias and gait problem.  Skin: Positive for rash.  Neurological: Negative for dizziness, syncope, speech difficulty, weakness, numbness and headaches.  Hematological: Negative for adenopathy.  Psychiatric/Behavioral: Negative for behavioral problems, dysphoric mood and agitation. The patient is not nervous/anxious.        Objective:   Physical Exam  Constitutional: She is oriented to person, place, and time. She appears well-developed and well-nourished.  HENT:  Head: Normocephalic.  Right Ear: External ear normal.  Left Ear: External ear normal.  Mouth/Throat: Oropharynx is clear and moist.  Eyes: Conjunctivae and EOM are normal. Pupils are equal, round, and reactive to light.  Neck: Normal range of motion. Neck supple. No thyromegaly present.  Cardiovascular: Normal rate, regular rhythm, normal heart sounds and intact distal pulses.   Pulmonary/Chest: Effort normal and breath sounds normal.  Abdominal: Soft. Bowel sounds are normal. She exhibits no mass. There is no tenderness.  Genitourinary: No vaginal discharge found.  Erythematous rash in the perineal area, most marked in the perianal area  Musculoskeletal: Normal range of motion.  Lymphadenopathy:    She has no cervical adenopathy.  Neurological: She is alert and oriented to person, place, and  time.  Skin: Skin is warm and dry. No rash noted.  Psychiatric: She has a normal mood and affect. Her behavior is normal.          Assessment & Plan:    Heat rash/fungal dermatitis  Local measures discussed Patient information discussed and dispensed We'll treat with topical antifungal's

## 2015-01-17 NOTE — Progress Notes (Signed)
Pre visit review using our clinic review tool, if applicable. No additional management support is needed unless otherwise documented below in the visit note. 

## 2015-01-17 NOTE — Patient Instructions (Signed)
Heat Rash Heat rash (miliaria) is a skin irritation caused by heavy sweating during hot, humid weather. It results from blockage of the sweat glands on our body. It can occur at any age. It is most common in young children whose sweat ducts are still developing or are not fully developed. Tight clothing may make the condition worse. Heat rash can look like small blisters (vesicles) that break open easily with bathing or minimal pressure. These blisters are found most commonly on the face, upper trunk of children and the trunk of adults. It can also look like a red cluster of red bumps or pimples (pustules). These usually itch and can also sometimes burn. It is more likely to occur on the neck and upper chest, in the groin, under the breasts, and in elbow creases. HOME CARE INSTRUCTIONS   The best treatment for heat rash is to provide a cooler, less humid environment where sweating is much decreased.  Keep the affected area dry. Dusting powder (cornstarch powder, baby powder) may be used to increase comfort. Avoid using ointments or creams. They keep the skin warm and moist and may make the condition worse.  Treating heat rash is simple and usually does not require medical assistance. SEEK MEDICAL CARE IF:   There is any evidence of infection such as fever, redness, swelling.  There is discomfort such as pain.  The skin lesions do no resolve with cooler, dryer environment. MAKE SURE YOU:   Understand these instructions.  Will watch your condition.  Will get help right away if you are not doing well or get worse. Document Released: 06/25/2009 Document Revised: 09/29/2011 Document Reviewed: 06/25/2009 Exit[employer] Patient Information 2015 [employer]. This information is not intended to replace advice given to you by your health care provider. Make sure you discuss any questions you have with your health care provider.  

## 2015-01-24 ENCOUNTER — Telehealth: Payer: Self-pay | Admitting: Internal Medicine

## 2015-01-24 NOTE — Telephone Encounter (Signed)
Pt states the rash she has is not better.  The cream does not help,/ and actually has made it burn. The bumps are not healing, they are staying just the same as when she saw you last on 6/29.  [facility] [address]  Pt aware dr Kirtland Bouchardk is out but would like you to call.

## 2015-01-25 MED ORDER — HYDROCORTISONE 1 % EX CREA: 1.0000 "application " | TOPICAL_CREAM | Freq: Two times a day (BID) | CUTANEOUS | Status: DC

## 2015-01-25 NOTE — Telephone Encounter (Signed)
Please see message and advise 

## 2015-01-25 NOTE — Telephone Encounter (Signed)
Hydrocortisone cream 1% use twice daily  Ask  patient to reread recommendations concerning "heat rash"

## 2015-01-25 NOTE — Telephone Encounter (Signed)
Spoke to pt, told her Rx for Hydrocortisone cream 1% was sent to pharmacy apply twice daily to area. Also reread recommendations on Heat Rash that Dr.K. Gave you. Pt said I did today and that she is wearing a panty liner. Told pt no panty liner, wear loose clothing and no underwear when at home. Pt verbalized understanding.

## 2015-02-02 ENCOUNTER — Ambulatory Visit (INDEPENDENT_AMBULATORY_CARE_PROVIDER_SITE_OTHER): Payer: 59 | Admitting: Internal Medicine

## 2015-02-02 ENCOUNTER — Encounter: Payer: Self-pay | Admitting: Internal Medicine

## 2015-02-02 VITALS — BP 130/60 | HR 98 | Temp 98.5°F | Resp 20 | Ht 64.0 in | Wt 103.0 lb

## 2015-02-02 DIAGNOSIS — I10 Essential (primary) hypertension: Secondary | ICD-10-CM | POA: Diagnosis not present

## 2015-02-02 MED ORDER — METHOCARBAMOL 500 MG PO TABS: 500.0000 mg | ORAL_TABLET | Freq: Four times a day (QID) | ORAL | Status: DC | PRN

## 2015-02-02 MED ORDER — CLOTRIMAZOLE-BETAMETHASONE 1-0.05 % EX CREA: 1.0000 "application " | TOPICAL_CREAM | Freq: Two times a day (BID) | CUTANEOUS | Status: DC

## 2015-02-02 NOTE — Progress Notes (Signed)
Subjective:    Patient ID: [name], female    DOB: 1950/11/27, 64 y.o.   MRN: [id number]  HPI 64 year old patient who has essential hypertension.  She was seen approximately 2 weeks ago and treated for a heat rash involving the perineal area.  She was treated with local measures and Ketoconazole cream.  She took this only a brief period of time and discontinued due to a burning sensation.  She has been using 1% hydrocortisone.  She feels improved but still having some redness and local irritation.  Past Medical History  Diagnosis Date  . Alcoholism 02/02/2009  . ANXIETY 01/21/2007  . DIVERTICULOSIS 07/09/2007  . Duodenal ulcer hemorrhage perforated 2006  . HYPERTENSION 06/23/2007  . HYPONATREMIA 02/02/2009    better off diuretic  . INSOMNIA 05/31/2007  . OSTEOPOROSIS 01/21/2007  . Raynaud's syndrome 06/23/2007  . VITAMIN B12 DEFICIENCY 10/02/2009  . Zoster 2012    twice - left groin/vagina  . NSAID-induced duodenal ulcer   . Blood transfusion     hx of 2005   . Headache(784.0) 05/31/2007    pt denies at 08/12/11 preop visit   . Iron deficiency anemia     pt denies at 08/12/11 visit   . Recurrent upper respiratory infection (URI)     pt reports slight cold using Nyquil prn at bedtime     History   Social History  . Marital Status: Married    Spouse Name: N/A  . Number of Children: 1  . Years of Education: N/A   Occupational History  . FT student    Social History Main Topics  . Smoking status: Never Smoker   . Smokeless tobacco: Never Used  . Alcohol Use: No  . Drug Use: No  . Sexual Activity: Not on file   Other Topics Concern  . Not on file   Social History Narrative    Past Surgical History  Procedure Laterality Date  . Tubal ligation  1977  . Upper gastrointestinal endoscopy  09/16/2007; 12/21/2008; 03/17/2011    2009: Normal 2010: Normal with enteroscopy2012: Gastrojejunal  anastomotic ulcer, gastric retention  . Colonoscopy  06/18/2007    angulated and  stenotic sigmoid colon with severe sigmoid diverticulosis  . Exploratory laparotomy  08/03/2004    1) duodenal ulcer oversew, pyloroplasty, anterior, posterior and truncal vagotomies  . Exploratory laparotomy  10/20/2004    1) duodenal ulcer oversew  with exclusion2) side-side gastrojejunostomy 3) feeding jejunostomy  . Laparotomy  08/14/2011    Procedure: EXPLORATORY LAPAROTOMY;  Surgeon: Kandis Cocking, MD;  Location: [facility];  Service: General;  Laterality: N/A;  exploratory laparotomy, lysis of adhesions, revision gastrojejunostomy, upper endoscopy  . Laparoscopy  08/14/2011    Procedure: LAPAROSCOPY DIAGNOSTIC;  Surgeon: Kandis Cocking, MD;  Location: [facility];  Service: General;  Laterality: N/A;    Family History  Problem Relation Age of Onset  . Stroke Father     Allergies  Allergen Reactions  . Sulfonamide Derivatives Anaphylaxis and Rash  . Penicillins     REACTION: unspecified  . Hydromorphone Hcl Rash    Current Outpatient Prescriptions on File Prior to Visit  Medication Sig Dispense Refill  . acetaminophen (TYLENOL) 500 MG tablet Take 500 mg by mouth every 6 (six) hours as needed for mild pain.     Marland Kitchen ALPRAZolam (XANAX) 0.5 MG tablet TAKE 1 TABLET 3 TIMES DAILY AS NEEDED. 60 tablet 2  . Ca Carbonate-Mag Hydroxide (ROLAIDS PO) Take 2 tablets by mouth daily  as needed (indigestion).    Marland Kitchen. lisinopril (PRINIVIL,ZESTRIL) 20 MG tablet take 1 tablet by mouth once daily WITH BREAKFAST 90 tablet 0   No current facility-administered medications on file prior to visit.    BP 130/60 mmHg  Pulse 98  Temp(Src) 98.5 F (36.9 C) (Oral)  Resp 20  Ht 5\' 4"  (1.626 m)  Wt 103 lb (46.72 kg)  BMI 17.67 kg/m2  SpO2 96%      Review of Systems  Constitutional: Negative.   HENT: Negative for congestion, dental problem, hearing loss, rhinorrhea, sinus pressure, sore throat and tinnitus.   Eyes: Negative for pain, discharge and visual disturbance.  Respiratory: Negative for cough and  shortness of breath.   Cardiovascular: Negative for chest pain, palpitations and leg swelling.  Gastrointestinal: Negative for nausea, vomiting, abdominal pain, diarrhea, constipation, blood in stool and abdominal distention.  Genitourinary: Negative for dysuria, urgency, frequency, hematuria, flank pain, vaginal bleeding, vaginal discharge, difficulty urinating, vaginal pain and pelvic pain.  Musculoskeletal: Negative for joint swelling, arthralgias and gait problem.  Skin: Positive for rash.  Neurological: Negative for dizziness, syncope, speech difficulty, weakness, numbness and headaches.  Hematological: Negative for adenopathy.  Psychiatric/Behavioral: Negative for behavioral problems, dysphoric mood and agitation. The patient is not nervous/anxious.        Objective:   Physical Exam  Constitutional: She appears well-developed and well-nourished. No distress.  Genitourinary:  Mild erythema in the perianal and perineal regions No open lesions          Assessment & Plan:   Heat rash.  Local measures.  Again discussed.  Information provided.  Will try Lotrisone cream twice daily Essential hypertension, stable

## 2015-02-02 NOTE — Patient Instructions (Signed)
Heat Rash Heat rash (miliaria) is a skin irritation caused by heavy sweating during hot, humid weather. It results from blockage of the sweat glands on our body. It can occur at any age. It is most common in young children whose sweat ducts are still developing or are not fully developed. Tight clothing may make the condition worse. Heat rash can look like small blisters (vesicles) that break open easily with bathing or minimal pressure. These blisters are found most commonly on the face, upper trunk of children and the trunk of adults. It can also look like a red cluster of red bumps or pimples (pustules). These usually itch and can also sometimes burn. It is more likely to occur on the neck and upper chest, in the groin, under the breasts, and in elbow creases. HOME CARE INSTRUCTIONS   The best treatment for heat rash is to provide a cooler, less humid environment where sweating is much decreased.  Keep the affected area dry. Dusting powder (cornstarch powder, baby powder) may be used to increase comfort. Avoid using ointments or creams. They keep the skin warm and moist and may make the condition worse.  Treating heat rash is simple and usually does not require medical assistance. SEEK MEDICAL CARE IF:   There is any evidence of infection such as fever, redness, swelling.  There is discomfort such as pain.  The skin lesions do no resolve with cooler, dryer environment. MAKE SURE YOU:   Understand these instructions.  Will watch your condition.  Will get help right away if you are not doing well or get worse. Document Released: 06/25/2009 Document Revised: 09/29/2011 Document Reviewed: 06/25/2009 Exit[employer] Patient Information 2015 [employer]. This information is not intended to replace advice given to you by your health care provider. Make sure you discuss any questions you have with your health care provider.  

## 2015-02-02 NOTE — Progress Notes (Signed)
Pre visit review using our clinic review tool, if applicable. No additional management support is needed unless otherwise documented below in the visit note. 

## 2015-02-19 ENCOUNTER — Ambulatory Visit (INDEPENDENT_AMBULATORY_CARE_PROVIDER_SITE_OTHER): Payer: 59 | Admitting: Internal Medicine

## 2015-02-19 ENCOUNTER — Other Ambulatory Visit: Payer: Self-pay | Admitting: Internal Medicine

## 2015-02-19 ENCOUNTER — Encounter: Payer: Self-pay | Admitting: Internal Medicine

## 2015-02-19 ENCOUNTER — Telehealth: Payer: Self-pay | Admitting: Internal Medicine

## 2015-02-19 VITALS — BP 110/60 | HR 84 | Temp 98.3°F | Resp 20 | Ht 64.0 in | Wt 104.0 lb

## 2015-02-19 DIAGNOSIS — I10 Essential (primary) hypertension: Secondary | ICD-10-CM | POA: Diagnosis not present

## 2015-02-19 DIAGNOSIS — L740 Miliaria rubra: Secondary | ICD-10-CM | POA: Diagnosis not present

## 2015-02-19 MED ORDER — CLOTRIMAZOLE-BETAMETHASONE 1-0.05 % EX CREA: 1.0000 "application " | TOPICAL_CREAM | Freq: Two times a day (BID) | CUTANEOUS | Status: DC

## 2015-02-19 NOTE — Telephone Encounter (Signed)
Okay to refill Methocarbamol?

## 2015-02-19 NOTE — Telephone Encounter (Signed)
Pt request refill of the following: methocarbamol (ROBAXIN) 500 MG tablet   Phamacy:

## 2015-02-19 NOTE — Progress Notes (Signed)
Pre visit review using our clinic review tool, if applicable. No additional management support is needed unless otherwise documented below in the visit note. 

## 2015-02-19 NOTE — Telephone Encounter (Signed)
Pt has this rx filled 7/15 and states she is out. Pt did not realize this was a month rx

## 2015-02-19 NOTE — Patient Instructions (Signed)
Wash once daily only; avoid wipes, perfumes and harsh soaps  Use Desitin ointment  Wear loose fitting cotton underwear only.  No panty liners  Continue antifungal cream  Call for dermatology referral.  If unimproved

## 2015-02-19 NOTE — Progress Notes (Signed)
Subjective:    Patient ID: [name], female    DOB: [birth year], 64 y.o.   MRN: [id number]  HPI  64 year old patient who is seen today with persistent complaints of redness and some discomfort in the perineal area.  She has been treated with topical antifungal's and measures to prevent  heat rash.  She states the redness has improved but has not totally resolved.  She is using at times panny  Liners and is also using wipes that she states are unscented.  Past Medical History  Diagnosis Date  . Alcoholism 02/02/2009  . ANXIETY 01/21/2007  . DIVERTICULOSIS 07/09/2007  . Duodenal ulcer hemorrhage perforated 2006  . HYPERTENSION 06/23/2007  . HYPONATREMIA 02/02/2009    better off diuretic  . INSOMNIA 05/31/2007  . OSTEOPOROSIS 01/21/2007  . Raynaud's syndrome 06/23/2007  . VITAMIN B12 DEFICIENCY 10/02/2009  . Zoster 2012    twice - left groin/vagina  . NSAID-induced duodenal ulcer   . Blood transfusion     hx of 2005   . Headache(784.0) 05/31/2007    pt denies at 08/12/11 preop visit   . Iron deficiency anemia     pt denies at 08/12/11 visit   . Recurrent upper respiratory infection (URI)     pt reports slight cold using Nyquil prn at bedtime     History   Social History  . Marital Status: Married    Spouse Name: N/A  . Number of Children: 1  . Years of Education: N/A   Occupational History  . FT student    Social History Main Topics  . Smoking status: Never Smoker   . Smokeless tobacco: Never Used  . Alcohol Use: No  . Drug Use: No  . Sexual Activity: Not on file   Other Topics Concern  . Not on file   Social History Narrative    Past Surgical History  Procedure Laterality Date  . Tubal ligation  1977  . Upper gastrointestinal endoscopy  09/16/2007; 12/21/2008; 03/17/2011    2009: Normal 2010: Normal with enteroscopy2012: Gastrojejunal  anastomotic ulcer, gastric retention  . Colonoscopy  06/18/2007    angulated and stenotic sigmoid colon with severe sigmoid  diverticulosis  . Exploratory laparotomy  08/03/2004    1) duodenal ulcer oversew, pyloroplasty, anterior, posterior and truncal vagotomies  . Exploratory laparotomy  10/20/2004    1) duodenal ulcer oversew  with exclusion2) side-side gastrojejunostomy 3) feeding jejunostomy  . Laparotomy  08/14/2011    Procedure: EXPLORATORY LAPAROTOMY;  Surgeon: Kandis Cocking, MD;  Location: [facility];  Service: General;  Laterality: N/A;  exploratory laparotomy, lysis of adhesions, revision gastrojejunostomy, upper endoscopy  . Laparoscopy  08/14/2011    Procedure: LAPAROSCOPY DIAGNOSTIC;  Surgeon: Kandis Cocking, MD;  Location: [facility];  Service: General;  Laterality: N/A;    Family History  Problem Relation Age of Onset  . Stroke Father     Allergies  Allergen Reactions  . Sulfonamide Derivatives Anaphylaxis and Rash  . Penicillins     REACTION: unspecified  . Hydromorphone Hcl Rash    Current Outpatient Prescriptions on File Prior to Visit  Medication Sig Dispense Refill  . acetaminophen (TYLENOL) 500 MG tablet Take 500 mg by mouth every 6 (six) hours as needed for mild pain.     Marland Kitchen ALPRAZolam (XANAX) 0.5 MG tablet TAKE 1 TABLET 3 TIMES DAILY AS NEEDED. 60 tablet 2  . Ca Carbonate-Mag Hydroxide (ROLAIDS PO) Take 2 tablets by mouth daily as needed (indigestion).    Marland Kitchen  lisinopril (PRINIVIL,ZESTRIL) 20 MG tablet take 1 tablet by mouth once daily WITH BREAKFAST 90 tablet 0  . methocarbamol (ROBAXIN) 500 MG tablet Take 1 tablet (500 mg total) by mouth every 6 (six) hours as needed for muscle spasms. 40 tablet 0   No current facility-administered medications on file prior to visit.    BP 110/60 mmHg  Pulse 84  Temp(Src) 98.3 F (36.8 C) (Oral)  Resp 20  Ht 5\' 4"  (1.626 m)  Wt 104 lb (47.174 kg)  BMI 17.84 kg/m2  SpO2 95%     Review of Systems  Skin: Positive for rash.       Objective:   Physical Exam  Skin:  Mild erythema in the perianal and perineal area No blistering, ulceration or  discharge           Assessment & Plan:   Heat rash/fungal dermatitis.  Improved but still persistent erythema.  Local measures discussed.  She was told to stop using panty liners and to use loosefitting cotton undergarments only.  The wipes will be discontinued.  She was told to shower daily only with careful attention to avoid overvigorous cleansing in the perineal area

## 2015-02-19 NOTE — Telephone Encounter (Signed)
30

## 2015-02-20 MED ORDER — METHOCARBAMOL 500 MG PO TABS: 500.0000 mg | ORAL_TABLET | Freq: Four times a day (QID) | ORAL | Status: DC | PRN

## 2015-02-20 NOTE — Telephone Encounter (Signed)
Pt notified Rx sent to pharmacy

## 2015-03-01 ENCOUNTER — Telehealth: Payer: Self-pay | Admitting: Internal Medicine

## 2015-03-01 DIAGNOSIS — R21 Rash and other nonspecific skin eruption: Secondary | ICD-10-CM

## 2015-03-01 NOTE — Telephone Encounter (Signed)
Pt call to ask for a referral to see a dermatology. Said she is not getting any better

## 2015-03-01 NOTE — Telephone Encounter (Signed)
Ok to enter referral 

## 2015-03-05 ENCOUNTER — Telehealth: Payer: Self-pay | Admitting: Internal Medicine

## 2015-03-05 NOTE — Telephone Encounter (Signed)
Left detailed message order for referral to Dermatology was done and someone will contact you regarding an appointment. Any questions please call.

## 2015-03-05 NOTE — Telephone Encounter (Signed)
Pt husband called and said pt is now bleeding form the rash and need to have that referral for the dermatology asap

## 2015-03-05 NOTE — Telephone Encounter (Signed)
ok 

## 2015-03-05 NOTE — Telephone Encounter (Signed)
Referral done, Pt aware appointment scheduled for today.

## 2015-04-19 ENCOUNTER — Other Ambulatory Visit: Payer: Self-pay | Admitting: Internal Medicine

## 2015-05-12 ENCOUNTER — Other Ambulatory Visit: Payer: Self-pay | Admitting: Internal Medicine

## 2015-06-25 ENCOUNTER — Ambulatory Visit (INDEPENDENT_AMBULATORY_CARE_PROVIDER_SITE_OTHER): Payer: 59 | Admitting: Internal Medicine

## 2015-06-25 ENCOUNTER — Encounter: Payer: Self-pay | Admitting: Internal Medicine

## 2015-06-25 VITALS — BP 120/62 | HR 71 | Temp 99.1°F | Resp 18 | Ht 64.0 in | Wt 105.0 lb

## 2015-06-25 DIAGNOSIS — J069 Acute upper respiratory infection, unspecified: Secondary | ICD-10-CM

## 2015-06-25 DIAGNOSIS — E538 Deficiency of other specified B group vitamins: Secondary | ICD-10-CM | POA: Diagnosis not present

## 2015-06-25 DIAGNOSIS — B9789 Other viral agents as the cause of diseases classified elsewhere: Secondary | ICD-10-CM

## 2015-06-25 DIAGNOSIS — I10 Essential (primary) hypertension: Secondary | ICD-10-CM | POA: Diagnosis not present

## 2015-06-25 NOTE — Progress Notes (Signed)
Subjective:    Patient ID: [name], female    DOB: 1951-06-22, 64 y.o.   MRN: [id number]  HPI  64 year old patient who has a history of essential hypertension.  She presents with a two-week history of dry [place] nonproductive cough.  She has had chest congestion and refractory cough that has interfered with sleep.  No fever or sputum production, shortness of breath or wheezing. She has remote history tobacco use but not in greater than 10 years.  Past Medical History  Diagnosis Date  . Alcoholism ([facility]) 02/02/2009  . ANXIETY 01/21/2007  . DIVERTICULOSIS 07/09/2007  . Duodenal ulcer hemorrhage perforated 2006  . HYPERTENSION 06/23/2007  . HYPONATREMIA 02/02/2009    better off diuretic  . INSOMNIA 05/31/2007  . OSTEOPOROSIS 01/21/2007  . Raynaud's syndrome 06/23/2007  . VITAMIN B12 DEFICIENCY 10/02/2009  . Zoster 2012    twice - left groin/vagina  . NSAID-induced duodenal ulcer   . Blood transfusion     hx of 2005   . Headache(784.0) 05/31/2007    pt denies at 08/12/11 preop visit   . Iron deficiency anemia     pt denies at 08/12/11 visit   . Recurrent upper respiratory infection (URI)     pt reports slight cold using Nyquil prn at bedtime     Social History   Social History  . Marital Status: Married    Spouse Name: N/A  . Number of Children: 1  . Years of Education: N/A   Occupational History  . FT student    Social History Main Topics  . Smoking status: Never Smoker   . Smokeless tobacco: Never Used  . Alcohol Use: No  . Drug Use: No  . Sexual Activity: Not on file   Other Topics Concern  . Not on file   Social History Narrative    Past Surgical History  Procedure Laterality Date  . Tubal ligation  1977  . Upper gastrointestinal endoscopy  09/16/2007; 12/21/2008; 03/17/2011    2009: Normal 2010: Normal with enteroscopy2012: Gastrojejunal  anastomotic ulcer, gastric retention  . Colonoscopy  06/18/2007    angulated and stenotic sigmoid colon with severe sigmoid  diverticulosis  . Exploratory laparotomy  08/03/2004    1) duodenal ulcer oversew, pyloroplasty, anterior, posterior and truncal vagotomies  . Exploratory laparotomy  10/20/2004    1) duodenal ulcer oversew  with exclusion2) side-side gastrojejunostomy 3) feeding jejunostomy  . Laparotomy  08/14/2011    Procedure: EXPLORATORY LAPAROTOMY;  Surgeon: Kandis Cocking, MD;  Location: [facility];  Service: General;  Laterality: N/A;  exploratory laparotomy, lysis of adhesions, revision gastrojejunostomy, upper endoscopy  . Laparoscopy  08/14/2011    Procedure: LAPAROSCOPY DIAGNOSTIC;  Surgeon: Kandis Cocking, MD;  Location: [facility];  Service: General;  Laterality: N/A;    Family History  Problem Relation Age of Onset  . Stroke Father     Allergies  Allergen Reactions  . Sulfonamide Derivatives Anaphylaxis and Rash  . Penicillins     REACTION: unspecified  . Hydromorphone Hcl Rash    Current Outpatient Prescriptions on File Prior to Visit  Medication Sig Dispense Refill  . acetaminophen (TYLENOL) 500 MG tablet Take 500 mg by mouth every 6 (six) hours as needed for mild pain.     Marland Kitchen ALPRAZolam (XANAX) 0.5 MG tablet TAKE 1 TABLET 3 TIMES DAILY AS NEEDED. 60 tablet 2  . Ca Carbonate-Mag Hydroxide (ROLAIDS PO) Take 2 tablets by mouth daily as needed (indigestion).    . clotrimazole-betamethasone (  LOTRISONE) cream Apply 1 application topically 2 (two) times daily. 45 g 1  . lisinopril (PRINIVIL,ZESTRIL) 20 MG tablet take 1 tablet by mouth once daily WITH BREAKFAST 90 tablet 1  . methocarbamol (ROBAXIN) 500 MG tablet Take 1 tablet (500 mg total) by mouth every 6 (six) hours as needed for muscle spasms. 30 tablet 0   No current facility-administered medications on file prior to visit.    BP 120/62 mmHg  Pulse 71  Temp(Src) 99.1 F (37.3 C) (Oral)  Resp 18  Ht 5\' 4"  (1.626 m)  Wt 105 lb (47.628 kg)  BMI 18.01 kg/m2  SpO2 97%           Review of Systems  Constitutional: Positive for fatigue.    HENT: Negative for congestion, dental problem, hearing loss, rhinorrhea, sinus pressure, sore throat and tinnitus.   Eyes: Negative for pain, discharge and visual disturbance.  Respiratory: Positive for cough. Negative for shortness of breath.   Cardiovascular: Negative for chest pain, palpitations and leg swelling.  Gastrointestinal: Negative for nausea, vomiting, abdominal pain, diarrhea, constipation, blood in stool and abdominal distention.  Genitourinary: Negative for dysuria, urgency, frequency, hematuria, flank pain, vaginal bleeding, vaginal discharge, difficulty urinating, vaginal pain and pelvic pain.  Musculoskeletal: Negative for joint swelling, arthralgias and gait problem.  Skin: Negative for rash.  Neurological: Negative for dizziness, syncope, speech difficulty, weakness, numbness and headaches.  Hematological: Negative for adenopathy.  Psychiatric/Behavioral: Positive for sleep disturbance. Negative for behavioral problems, dysphoric mood and agitation. The patient is nervous/anxious.        Objective:   Physical Exam  Constitutional: She is oriented to person, place, and time. She appears well-developed and well-nourished. No distress.  Temperature 99.1   HENT:  Head: Normocephalic.  Right Ear: External ear normal.  Left Ear: External ear normal.  Mouth/Throat: Oropharynx is clear and moist.  Eyes: Conjunctivae and EOM are normal. Pupils are equal, round, and reactive to light.  Neck: Normal range of motion. Neck supple. No thyromegaly present.  Cardiovascular: Normal rate, regular rhythm, normal heart sounds and intact distal pulses.   Pulmonary/Chest: Effort normal and breath sounds normal. No respiratory distress. She has no wheezes. She has no rales.  Abdominal: Soft. Bowel sounds are normal. She exhibits no mass. There is no tenderness.  Musculoskeletal: Normal range of motion.  Lymphadenopathy:    She has no cervical adenopathy.  Neurological: She is alert  and oriented to person, place, and time.  Skin: Skin is warm and dry. No rash noted.  Psychiatric: She has a normal mood and affect. Her behavior is normal.          Assessment & Plan:   Viral URI with cough.  Will treat symptomatically Essential hypertension   Schedule annual exam

## 2015-06-25 NOTE — Patient Instructions (Signed)
Acute bronchitis symptoms  are generally not helped by antibiotics.  Take over-the-counter expectorants and cough medications such as  Mucinex DM.  Call if there is no improvement in 5 to 7 days or if  you develop worsening cough, fever, or new symptoms, such as shortness of breath or chest pain.  Return in 6 months for follow-up

## 2015-06-25 NOTE — Progress Notes (Signed)
Pre visit review using our clinic review tool, if applicable. No additional management support is needed unless otherwise documented below in the visit note. 

## 2015-07-05 ENCOUNTER — Telehealth: Payer: Self-pay | Admitting: Internal Medicine

## 2015-07-05 ENCOUNTER — Encounter: Payer: Self-pay | Admitting: Adult Health

## 2015-07-05 ENCOUNTER — Ambulatory Visit (INDEPENDENT_AMBULATORY_CARE_PROVIDER_SITE_OTHER): Payer: 59 | Admitting: Adult Health

## 2015-07-05 VITALS — BP 130/80 | Temp 98.5°F | Ht 64.0 in | Wt 106.0 lb

## 2015-07-05 DIAGNOSIS — J209 Acute bronchitis, unspecified: Secondary | ICD-10-CM | POA: Diagnosis not present

## 2015-07-05 MED ORDER — METHYLPREDNISOLONE 4 MG PO TBPK: ORAL_TABLET | ORAL | Status: DC

## 2015-07-05 NOTE — Telephone Encounter (Signed)
Error/njr °

## 2015-07-05 NOTE — Progress Notes (Signed)
Pre visit review using our clinic review tool, if applicable. No additional management support is needed unless otherwise documented below in the visit note. 

## 2015-07-05 NOTE — Progress Notes (Signed)
Subjective:    Patient ID: [name], female    DOB: 1951/01/03, 64 y.o.   MRN: [id number]  HPI 64 year old female who presents to the office today for continued cough. She last saw her PCP 10 days ago for this issue, at that time the cough had been ongoing for two weeks. It was suspected to be a viral URI and symptoms were treated. She denies any fever, sputum production, shortness of breath or wheezing. Her only complaint is that of left back pain, she endorses that this is from coughing so much.    Review of Systems  Constitutional: Negative.   HENT: Positive for congestion. Negative for postnasal drip, rhinorrhea, sinus pressure and sore throat.   Respiratory: Positive for cough. Negative for chest tightness, shortness of breath and wheezing.   Cardiovascular: Negative.   Musculoskeletal: Positive for back pain. Negative for myalgias, arthralgias, neck pain and neck stiffness.  Hematological: Negative.   Psychiatric/Behavioral: Positive for sleep disturbance.  All other systems reviewed and are negative.  Past Medical History  Diagnosis Date  . Alcoholism (HCC) 02/02/2009  . ANXIETY 01/21/2007  . DIVERTICULOSIS 07/09/2007  . Duodenal ulcer hemorrhage perforated 2006  . HYPERTENSION 06/23/2007  . HYPONATREMIA 02/02/2009    better off diuretic  . INSOMNIA 05/31/2007  . OSTEOPOROSIS 01/21/2007  . Raynaud's syndrome 06/23/2007  . VITAMIN B12 DEFICIENCY 10/02/2009  . Zoster 2012    twice - left groin/vagina  . NSAID-induced duodenal ulcer   . Blood transfusion     hx of 2005   . Headache(784.0) 05/31/2007    pt denies at 08/12/11 preop visit   . Iron deficiency anemia     pt denies at 08/12/11 visit   . Recurrent upper respiratory infection (URI)     pt reports slight cold using Nyquil prn at bedtime     Social History   Social History  . Marital Status: Married    Spouse Name: N/A  . Number of Children: 1  . Years of Education: N/A   Occupational History  . FT student      Social History Main Topics  . Smoking status: Never Smoker   . Smokeless tobacco: Never Used  . Alcohol Use: No  . Drug Use: No  . Sexual Activity: Not on file   Other Topics Concern  . Not on file   Social History Narrative    Past Surgical History  Procedure Laterality Date  . Tubal ligation  1977  . Upper gastrointestinal endoscopy  09/16/2007; 12/21/2008; 03/17/2011    2009: Normal 2010: Normal with enteroscopy2012: Gastrojejunal  anastomotic ulcer, gastric retention  . Colonoscopy  06/18/2007    angulated and stenotic sigmoid colon with severe sigmoid diverticulosis  . Exploratory laparotomy  08/03/2004    1) duodenal ulcer oversew, pyloroplasty, anterior, posterior and truncal vagotomies  . Exploratory laparotomy  10/20/2004    1) duodenal ulcer oversew  with exclusion2) side-side gastrojejunostomy 3) feeding jejunostomy  . Laparotomy  08/14/2011    Procedure: EXPLORATORY LAPAROTOMY;  Surgeon: Kandis Cockingavid H Newman, MD;  Location: [facility];  Service: General;  Laterality: N/A;  exploratory laparotomy, lysis of adhesions, revision gastrojejunostomy, upper endoscopy  . Laparoscopy  08/14/2011    Procedure: LAPAROSCOPY DIAGNOSTIC;  Surgeon: Kandis Cockingavid H Newman, MD;  Location: [facility];  Service: General;  Laterality: N/A;    Family History  Problem Relation Age of Onset  . Stroke Father     Allergies  Allergen Reactions  . Sulfonamide Derivatives Anaphylaxis  and Rash  . Penicillins     REACTION: unspecified  . Hydromorphone Hcl Rash    Current Outpatient Prescriptions on File Prior to Visit  Medication Sig Dispense Refill  . acetaminophen (TYLENOL) 500 MG tablet Take 500 mg by mouth every 6 (six) hours as needed for mild pain.     Marland Kitchen ALPRAZolam (XANAX) 0.5 MG tablet TAKE 1 TABLET 3 TIMES DAILY AS NEEDED. 60 tablet 2  . Ca Carbonate-Mag Hydroxide (ROLAIDS PO) Take 2 tablets by mouth daily as needed (indigestion).    . clotrimazole-betamethasone (LOTRISONE) cream Apply 1 application  topically 2 (two) times daily. 45 g 1  . lisinopril (PRINIVIL,ZESTRIL) 20 MG tablet take 1 tablet by mouth once daily WITH BREAKFAST 90 tablet 1   No current facility-administered medications on file prior to visit.    BP 130/80 mmHg  Temp(Src) 98.5 F (36.9 C) (Oral)  Ht  (1.626 m)  Wt 106 lb (48.081 kg)  BMI 18.19 kg/m2       Objective:   Physical Exam  Constitutional: She is oriented to person, place, and time. She appears well-developed and well-nourished. No distress.  HENT:  Head: Normocephalic and atraumatic.  Right Ear: External ear normal.  Left Ear: External ear normal.  Nose: Nose normal.  Mouth/Throat: Oropharynx is clear and moist. No oropharyngeal exudate.  Neck: Normal range of motion. Neck supple.  Cardiovascular: Normal rate, regular rhythm, normal heart sounds and intact distal pulses.  Exam reveals no gallop and no friction rub.   No murmur heard. Pulmonary/Chest: Effort normal and breath sounds normal. No respiratory distress. She has no wheezes. She has no rales. She exhibits no tenderness.  Dry cough  Musculoskeletal: Normal range of motion. She exhibits no edema or tenderness.  Lymphadenopathy:    She has no cervical adenopathy.  Neurological: She is alert and oriented to person, place, and time.  Skin: Skin is warm and dry. No rash noted. She is not diaphoretic. No erythema. No pallor.  Psychiatric: She has a normal mood and affect. Her behavior is normal. Judgment and thought content normal.  Nursing note and vitals reviewed.     Assessment & Plan:  1. Acute bronchitis, unspecified organism - methylPREDNISolone (MEDROL DOSEPAK) 4 MG TBPK tablet; Take as directed on package  Dispense: 21 tablet; Refill: 0 - Unlikely bacterial or pna due to no fevers, sputum production or feeling acutely ill.

## 2015-07-05 NOTE — Patient Instructions (Signed)
It was a pleasure meeting you today!  I have sent in a prescription to the pharmacy for prednisone. Take as directed on the package.   Follow up in 2-3 days if no improvement.

## 2015-08-03 ENCOUNTER — Encounter: Payer: Self-pay | Admitting: Internal Medicine

## 2015-08-03 ENCOUNTER — Ambulatory Visit (INDEPENDENT_AMBULATORY_CARE_PROVIDER_SITE_OTHER): Payer: BLUE CROSS/BLUE SHIELD | Admitting: Internal Medicine

## 2015-08-03 VITALS — BP 140/78 | HR 76 | Temp 98.1°F | Resp 20 | Ht 64.0 in | Wt 106.0 lb

## 2015-08-03 DIAGNOSIS — I10 Essential (primary) hypertension: Secondary | ICD-10-CM

## 2015-08-03 DIAGNOSIS — M545 Low back pain, unspecified: Secondary | ICD-10-CM

## 2015-08-03 MED ORDER — TRAMADOL HCL 50 MG PO TABS: 50.0000 mg | ORAL_TABLET | Freq: Four times a day (QID) | ORAL | Status: DC | PRN

## 2015-08-03 NOTE — Patient Instructions (Signed)
Most patients with low back pain will improve with time over the next two to 6 weeks.  Keep active but avoid any activities that cause pain.  Apply moist heat to the low back area several times daily.  Back Exercises If you have pain in your back, do these exercises 2-3 times each day or as told by your doctor. When the pain goes away, do the exercises once each day, but repeat the steps more times for each exercise (do more repetitions). If you do not have pain in your back, do these exercises once each day or as told by your doctor. EXERCISES Single Knee to Chest Do these steps 3-5 times in a row for each leg:  Lie on your back on a firm bed or the floor with your legs stretched out.  Bring one knee to your chest.  Hold your knee to your chest by grabbing your knee or thigh.  Pull on your knee until you feel a gentle stretch in your lower back.  Keep doing the stretch for 10-30 seconds.  Slowly let go of your leg and straighten it. Pelvic Tilt Do these steps 5-10 times in a row:  Lie on your back on a firm bed or the floor with your legs stretched out.  Bend your knees so they point up to the ceiling. Your feet should be flat on the floor.  Tighten your lower belly (abdomen) muscles to press your lower back against the floor. This will make your tailbone point up to the ceiling instead of pointing down to your feet or the floor.  Stay in this position for 5-10 seconds while you gently tighten your muscles and breathe evenly. Cat-Cow Do these steps until your lower back bends more easily:  Get on your hands and knees on a firm surface. Keep your hands under your shoulders, and keep your knees under your hips. You may put padding under your knees.  Let your head hang down, and make your tailbone point down to the floor so your lower back is round like the back of a cat.  Stay in this position for 5 seconds.  Slowly lift your head and make your tailbone point up to the ceiling so  your back hangs low (sags) like the back of a cow.  Stay in this position for 5 seconds. Press-Ups Do these steps 5-10 times in a row:  Lie on your belly (face-down) on the floor.  Place your hands near your head, about shoulder-width apart.  While you keep your back relaxed and keep your hips on the floor, slowly straighten your arms to raise the top half of your body and lift your shoulders. Do not use your back muscles. To make yourself more comfortable, you may change where you place your hands.  Stay in this position for 5 seconds.  Slowly return to lying flat on the floor. Bridges Do these steps 10 times in a row:  Lie on your back on a firm surface.  Bend your knees so they point up to the ceiling. Your feet should be flat on the floor.  Tighten your butt muscles and lift your butt off of the floor until your waist is almost as high as your knees. If you do not feel the muscles working in your butt and the back of your thighs, slide your feet 1-2 inches farther away from your butt.  Stay in this position for 3-5 seconds.  Slowly lower your butt to the floor, and  let your butt muscles relax. If this exercise is too easy, try doing it with your arms crossed over your chest. Belly Crunches Do these steps 5-10 times in a row:  Lie on your back on a firm bed or the floor with your legs stretched out.  Bend your knees so they point up to the ceiling. Your feet should be flat on the floor.  Cross your arms over your chest.  Tip your chin a little bit toward your chest but do not bend your neck.  Tighten your belly muscles and slowly raise your chest just enough to lift your shoulder blades a tiny bit off of the floor.  Slowly lower your chest and your head to the floor. Back Lifts Do these steps 5-10 times in a row: 1. Lie on your belly (face-down) with your arms at your sides, and rest your forehead on the floor. 2. Tighten the muscles in your legs and your  butt. 3. Slowly lift your chest off of the floor while you keep your hips on the floor. Keep the back of your head in line with the curve in your back. Look at the floor while you do this. 4. Stay in this position for 3-5 seconds. 5. Slowly lower your chest and your face to the floor. GET HELP IF:  Your back pain gets a lot worse when you do an exercise.  Your back pain does not lessen 2 hours after you exercise. If you have any of these problems, stop doing the exercises. Do not do them again unless your doctor says it is okay. GET HELP RIGHT AWAY IF:  You have sudden, very bad back pain. If this happens, stop doing the exercises. Do not do them again unless your doctor says it is okay.   This information is not intended to replace advice given to you by your health care provider. Make sure you discuss any questions you have with your health care provider.   Document Released: 08/09/2010 Document Revised: 03/28/2015 Document Reviewed: 08/31/2014 Elsevier Interactive Patient Education 2016 [place] Injury Prevention Back injuries can be very painful. They can also be difficult to heal. After having one back injury, you are more likely to injure your back again. It is important to learn how to avoid injuring or re-injuring your back. The following tips can help you to prevent a back injury. WHAT SHOULD I KNOW ABOUT PHYSICAL FITNESS?  Exercise for 30 minutes per day on most days of the week or as told by your doctor. Make sure to:  Do aerobic exercises, such as walking, jogging, biking, or swimming.  Do exercises that increase balance and strength, such as tai chi and yoga.  Do stretching exercises. This helps with flexibility.  Try to develop strong belly (abdominal) muscles. Your belly muscles help to support your back.  Stay at a healthy weight. This helps to decrease your risk of a back injury. WHAT SHOULD I KNOW ABOUT MY DIET?  Talk with your doctor about your overall  diet. Take supplements and vitamins only as told by your doctor.  Talk with your doctor about how much calcium and vitamin D you need each day. These nutrients help to prevent weakening of the bones (osteoporosis).  Include good sources of calcium in your diet, such as:  Dairy products.  Green leafy vegetables.  Products that have had calcium added to them (fortified).  Include good sources of vitamin D in your diet, such as:  Milk.  Foods  that have had vitamin D added to them. WHAT SHOULD I KNOW ABOUT MY POSTURE?  Sit up straight and stand up straight. Avoid leaning forward when you sit or hunching over when you stand.  Choose chairs that have good low-back (lumbar) support.  If you work at a desk, sit close to it so you do not need to lean over. Keep your chin tucked in. Keep your neck drawn back. Keep your elbows bent so your arms look like the letter "L" (right angle).  Sit high and close to the steering wheel when you drive. Add a low-back support to your car seat, if needed.  Avoid sitting or standing in one position for very long. Take breaks to get up, stretch, and walk around at least one time every hour. Take breaks every hour if you are driving for long periods of time.  Sleep on your side with your knees slightly bent, or sleep on your back with a pillow under your knees. Do not lie on the front of your body to sleep. WHAT SHOULD I KNOW ABOUT LIFTING, TWISTING, AND REACHING Lifting and Heavy Lifting  Avoid heavy lifting, especially lifting over and over again. If you must do heavy lifting:  Stretch before lifting.  Work slowly.  Rest between lifts.  Use a tool such as a cart or a dolly to move objects if one is available.  Make several small trips instead of carrying one heavy load.  Ask for help when you need it, especially when moving big objects.  Follow these steps when lifting:  Stand with your feet shoulder-width apart.  Get as close to the object  as you can. Do not pick up a heavy object that is far from your body.  Use handles or lifting straps if they are available.  Bend at your knees. Squat down, but keep your heels off the floor.  Keep your shoulders back. Keep your chin tucked in. Keep your back straight.  Lift the object slowly while you tighten the muscles in your legs, belly, and butt. Keep the object as close to the center of your body as possible.  Follow these steps when putting down a heavy load:  Stand with your feet shoulder-width apart.  Lower the object slowly while you tighten the muscles in your legs, belly, and butt. Keep the object as close to the center of your body as possible.  Keep your shoulders back. Keep your chin tucked in. Keep your back straight.  Bend at your knees. Squat down, but keep your heels off the floor.  Use handles or lifting straps if they are available. Twisting and Reaching  Avoid lifting heavy objects above your waist.  Do not twist at your waist while you are lifting or carrying a load. If you need to turn, move your feet.  Do not bend over without bending at your knees.  Avoid reaching over your head, across a table, or for an object on a high surface.  WHAT ARE SOME OTHER TIPS?  Avoid wet floors and icy ground. Keep sidewalks clear of ice to prevent falls.   Do not sleep on a mattress that is too soft or too hard.   Keep items that you use often within easy reach.   Put heavier objects on shelves at waist level, and put lighter objects on lower or higher shelves.  Find ways to lower your stress, such as:  Exercise.  Massage.  Relaxation techniques.  Talk with your doctor if you  feel anxious or depressed. These conditions can make back pain worse.  Wear flat heel shoes with cushioned soles.  Avoid making quick (sudden) movements.  Use both shoulder straps when carrying a backpack.  Do not use any tobacco products, including cigarettes, chewing  tobacco, or electronic cigarettes. If you need help quitting, ask your doctor.   This information is not intended to replace advice given to you by your health care provider. Make sure you discuss any questions you have with your health care provider.   Document Released: 12/24/2007 Document Revised: 11/21/2014 Document Reviewed: 07/11/2014 Elsevier Interactive Patient Education [employer].

## 2015-08-03 NOTE — Progress Notes (Signed)
Pre visit review using our clinic review tool, if applicable. No additional management support is needed unless otherwise documented below in the visit note. 

## 2015-08-03 NOTE — Progress Notes (Signed)
Subjective:    Patient ID: [name], female    DOB: 15-Jul-1951, 65 y.o.   MRN: [id number]  HPI  65 year old patient who has a history of essential hypertension.  She presents with a two-month history of intermittent pain involving the left lumbar and lower thoracic back region.  No history of falls or trauma.  States that she has some pain daily.  No real aggravating or alleviating factors.  No nocturnal pain.  She has been taking Tylenol only for the discomfort  Past Medical History  Diagnosis Date  . Alcoholism (HCC) 02/02/2009  . ANXIETY 01/21/2007  . DIVERTICULOSIS 07/09/2007  . Duodenal ulcer hemorrhage perforated 2006  . HYPERTENSION 06/23/2007  . HYPONATREMIA 02/02/2009    better off diuretic  . INSOMNIA 05/31/2007  . OSTEOPOROSIS 01/21/2007  . Raynaud's syndrome 06/23/2007  . VITAMIN B12 DEFICIENCY 10/02/2009  . Zoster 2012    twice - left groin/vagina  . NSAID-induced duodenal ulcer   . Blood transfusion     hx of 2005   . Headache(784.0) 05/31/2007    pt denies at 08/12/11 preop visit   . Iron deficiency anemia     pt denies at 08/12/11 visit   . Recurrent upper respiratory infection (URI)     pt reports slight cold using Nyquil prn at bedtime     Social History   Social History  . Marital Status: Married    Spouse Name: N/A  . Number of Children: 1  . Years of Education: N/A   Occupational History  . FT student    Social History Main Topics  . Smoking status: Never Smoker   . Smokeless tobacco: Never Used  . Alcohol Use: No  . Drug Use: No  . Sexual Activity: Not on file   Other Topics Concern  . Not on file   Social History Narrative    Past Surgical History  Procedure Laterality Date  . Tubal ligation  1977  . Upper gastrointestinal endoscopy  09/16/2007; 12/21/2008; 03/17/2011    2009: Normal 2010: Normal with enteroscopy2012: Gastrojejunal  anastomotic ulcer, gastric retention  . Colonoscopy  06/18/2007    angulated and stenotic sigmoid colon with  severe sigmoid diverticulosis  . Exploratory laparotomy  08/03/2004    1) duodenal ulcer oversew, pyloroplasty, anterior, posterior and truncal vagotomies  . Exploratory laparotomy  10/20/2004    1) duodenal ulcer oversew  with exclusion2) side-side gastrojejunostomy 3) feeding jejunostomy  . Laparotomy  08/14/2011    Procedure: EXPLORATORY LAPAROTOMY;  Surgeon: Kandis Cocking, MD;  Location: [facility];  Service: General;  Laterality: N/A;  exploratory laparotomy, lysis of adhesions, revision gastrojejunostomy, upper endoscopy  . Laparoscopy  08/14/2011    Procedure: LAPAROSCOPY DIAGNOSTIC;  Surgeon: Kandis Cocking, MD;  Location: [facility];  Service: General;  Laterality: N/A;    Family History  Problem Relation Age of Onset  . Stroke Father     Allergies  Allergen Reactions  . Sulfonamide Derivatives Anaphylaxis and Rash  . Penicillins     REACTION: unspecified  . Hydromorphone Hcl Rash    Current Outpatient Prescriptions on File Prior to Visit  Medication Sig Dispense Refill  . acetaminophen (TYLENOL) 500 MG tablet Take 500 mg by mouth every 6 (six) hours as needed for mild pain.     Marland Kitchen ALPRAZolam (XANAX) 0.5 MG tablet TAKE 1 TABLET 3 TIMES DAILY AS NEEDED. 60 tablet 2  . Ca Carbonate-Mag Hydroxide (ROLAIDS PO) Take 2 tablets by mouth daily as needed (indigestion).    Marland Kitchen  clotrimazole-betamethasone (LOTRISONE) cream Apply 1 application topically 2 (two) times daily. 45 g 1  . lisinopril (PRINIVIL,ZESTRIL) 20 MG tablet take 1 tablet by mouth once daily WITH BREAKFAST 90 tablet 1   No current facility-administered medications on file prior to visit.    BP 140/78 mmHg  Pulse 76  Temp(Src) 98.1 F (36.7 C) (Oral)  Resp 20  Ht 5\' 4"  (1.626 m)  Wt 106 lb (48.081 kg)  BMI 18.19 kg/m2  SpO2 98%      Review of Systems  Constitutional: Negative.   HENT: Negative for congestion, dental problem, hearing loss, rhinorrhea, sinus pressure, sore throat and tinnitus.   Eyes: Negative for  pain, discharge and visual disturbance.  Respiratory: Negative for cough and shortness of breath.   Cardiovascular: Negative for chest pain, palpitations and leg swelling.  Gastrointestinal: Negative for nausea, vomiting, abdominal pain, diarrhea, constipation, blood in stool and abdominal distention.  Genitourinary: Negative for dysuria, urgency, frequency, hematuria, flank pain, vaginal bleeding, vaginal discharge, difficulty urinating, vaginal pain and pelvic pain.  Musculoskeletal: Positive for back pain. Negative for joint swelling, arthralgias and gait problem.  Skin: Negative for rash.  Neurological: Negative for dizziness, syncope, speech difficulty, weakness, numbness and headaches.  Hematological: Negative for adenopathy.  Psychiatric/Behavioral: Negative for behavioral problems, dysphoric mood and agitation. The patient is not nervous/anxious.        Objective:   Physical Exam  Constitutional: She appears well-developed and well-nourished.  Cardiovascular: Normal rate and regular rhythm.   Pulmonary/Chest: Effort normal and breath sounds normal.  Musculoskeletal:  No significant tenderness over the left lumbar or thoracic regions No pain with twisting at the waist Negative straight leg test Full range of motion both hips          Assessment & Plan:   Left thoracic and lumbar pain.  Will treat symptomatically.  Will provide a regimen of stretching and range of motion and heat therapy.  Will call if unimproved

## 2015-08-31 ENCOUNTER — Ambulatory Visit (INDEPENDENT_AMBULATORY_CARE_PROVIDER_SITE_OTHER): Payer: BLUE CROSS/BLUE SHIELD

## 2015-08-31 DIAGNOSIS — Z23 Encounter for immunization: Secondary | ICD-10-CM | POA: Diagnosis not present

## 2015-10-01 ENCOUNTER — Other Ambulatory Visit: Payer: Self-pay | Admitting: Internal Medicine

## 2015-11-05 ENCOUNTER — Encounter (HOSPITAL_COMMUNITY): Payer: Self-pay | Admitting: Emergency Medicine

## 2015-11-05 ENCOUNTER — Telehealth: Payer: Self-pay | Admitting: Internal Medicine

## 2015-11-05 ENCOUNTER — Emergency Department (HOSPITAL_COMMUNITY)
Admission: EM | Admit: 2015-11-05 | Discharge: 2015-11-05 | Disposition: A | Discharge status: Home / Self Care | Payer: BLUE CROSS/BLUE SHIELD | Attending: Emergency Medicine | Admitting: Emergency Medicine

## 2015-11-05 DIAGNOSIS — Z8639 Personal history of other endocrine, nutritional and metabolic disease: Secondary | ICD-10-CM | POA: Insufficient documentation

## 2015-11-05 DIAGNOSIS — Z8669 Personal history of other diseases of the nervous system and sense organs: Secondary | ICD-10-CM | POA: Diagnosis not present

## 2015-11-05 DIAGNOSIS — R0789 Other chest pain: Secondary | ICD-10-CM | POA: Diagnosis not present

## 2015-11-05 DIAGNOSIS — Z862 Personal history of diseases of the blood and blood-forming organs and certain disorders involving the immune mechanism: Secondary | ICD-10-CM | POA: Insufficient documentation

## 2015-11-05 DIAGNOSIS — Z8719 Personal history of other diseases of the digestive system: Secondary | ICD-10-CM | POA: Insufficient documentation

## 2015-11-05 DIAGNOSIS — Z8619 Personal history of other infectious and parasitic diseases: Secondary | ICD-10-CM | POA: Diagnosis not present

## 2015-11-05 DIAGNOSIS — Z8709 Personal history of other diseases of the respiratory system: Secondary | ICD-10-CM | POA: Insufficient documentation

## 2015-11-05 DIAGNOSIS — R079 Chest pain, unspecified: Secondary | ICD-10-CM | POA: Diagnosis present

## 2015-11-05 DIAGNOSIS — I10 Essential (primary) hypertension: Secondary | ICD-10-CM | POA: Diagnosis not present

## 2015-11-05 DIAGNOSIS — F419 Anxiety disorder, unspecified: Secondary | ICD-10-CM | POA: Diagnosis not present

## 2015-11-05 DIAGNOSIS — Z880 Allergy status to penicillin: Secondary | ICD-10-CM | POA: Diagnosis not present

## 2015-11-05 DIAGNOSIS — Z8739 Personal history of other diseases of the musculoskeletal system and connective tissue: Secondary | ICD-10-CM | POA: Insufficient documentation

## 2015-11-05 DIAGNOSIS — Z79899 Other long term (current) drug therapy: Secondary | ICD-10-CM | POA: Insufficient documentation

## 2015-11-05 MED ORDER — TRAMADOL HCL 50 MG PO TABS: 50.0000 mg | ORAL_TABLET | Freq: Four times a day (QID) | ORAL | Status: DC | PRN

## 2015-11-05 MED ORDER — TRAMADOL HCL 50 MG PO TABS
50.0000 mg | ORAL_TABLET | Freq: Once | ORAL | Status: AC
  Administered 2015-11-05: 50 mg via ORAL
  Filled 2015-11-05: qty 1

## 2015-11-05 NOTE — Telephone Encounter (Signed)
Pt went [facility] and needs post er follow up tthis week. Can I create 30 min slot on Wednesday?. Pt husband cell  [phone]

## 2015-11-05 NOTE — Telephone Encounter (Signed)
Pt has been sch for wed at 1045 am. Pt husband is aware

## 2015-11-05 NOTE — Telephone Encounter (Signed)
FYI

## 2015-11-05 NOTE — Discharge Instructions (Signed)
Call Dr. Vernon PreyKwiatkowski's office today to schedule appointment for later this week. Take the pain medicine as prescribed. Ask Dr. Lesia HausenKwiatowski to help you with your alcohol problem. Is it is unsafe to drink alcohol with the medication prescribed and with Xanax. Get your blood pressure rechecked at your next office visit with Dr. Lesia HausenKwiatowski.today's was elevated at 162/62 .

## 2015-11-05 NOTE — ED Provider Notes (Signed)
CSN: [id number]     Arrival date & time 11/05/15  1033 History   First MD Initiated Contact with Patient 11/05/15 1340     Chief Complaint  Patient presents with  . left side pain      (Consider location/radiation/quality/duration/timing/severity/associated sxs/prior Treatment) HPI Complains of underneath left breast onset 4 days ago. Skin hurts to touch it hurts to wear a bra. Also pain is worsened with lying on her left side. She's treated self with Tylenol, without relief. No shortness of breath. Denies cough denies fever denies other associated symptoms. She called her primary care physician today who advised her to go to the emergency department. She has no pain when not touching the involved area Past Medical History  Diagnosis Date  . Alcoholism (HCC) 02/02/2009  . ANXIETY 01/21/2007  . DIVERTICULOSIS 07/09/2007  . Duodenal ulcer hemorrhage perforated 2006  . HYPERTENSION 06/23/2007  . HYPONATREMIA 02/02/2009    better off diuretic  . INSOMNIA 05/31/2007  . OSTEOPOROSIS 01/21/2007  . Raynaud's syndrome 06/23/2007  . VITAMIN B12 DEFICIENCY 10/02/2009  . Zoster 2012    twice - left groin/vagina  . NSAID-induced duodenal ulcer   . Blood transfusion     hx of 2005   . Headache(784.0) 05/31/2007    pt denies at 08/12/11 preop visit   . Iron deficiency anemia     pt denies at 08/12/11 visit   . Recurrent upper respiratory infection (URI)     pt reports slight cold using Nyquil prn at bedtime    Past Surgical History  Procedure Laterality Date  . Tubal ligation  1977  . Upper gastrointestinal endoscopy  09/16/2007; 12/21/2008; 03/17/2011    2009: Normal 2010: Normal with enteroscopy2012: Gastrojejunal  anastomotic ulcer, gastric retention  . Colonoscopy  06/18/2007    angulated and stenotic sigmoid colon with severe sigmoid diverticulosis  . Exploratory laparotomy  08/03/2004    1) duodenal ulcer oversew, pyloroplasty, anterior, posterior and truncal vagotomies  . Exploratory laparotomy   10/20/2004    1) duodenal ulcer oversew  with exclusion2) side-side gastrojejunostomy 3) feeding jejunostomy  . Laparotomy  08/14/2011    Procedure: EXPLORATORY LAPAROTOMY;  Surgeon: Kandis Cockingavid H Newman, MD;  Location: [facility];  Service: General;  Laterality: N/A;  exploratory laparotomy, lysis of adhesions, revision gastrojejunostomy, upper endoscopy  . Laparoscopy  08/14/2011    Procedure: LAPAROSCOPY DIAGNOSTIC;  Surgeon: Kandis Cockingavid H Newman, MD;  Location: [facility];  Service: General;  Laterality: N/A;   Family History  Problem Relation Age of Onset  . Stroke Father    Social History  Substance Use Topics  . Smoking status: Never Smoker   . Smokeless tobacco: Never Used  . Alcohol Use: 6.0 oz/week    10 Cans of beer per week     Comment: 12 oz   OB History    No data available     Review of Systems  Constitutional: Negative.   HENT: Negative.   Respiratory: Negative.   Cardiovascular: Positive for chest pain.  Gastrointestinal: Negative.   Musculoskeletal: Negative.   Skin: Negative.   Neurological: Negative.   Psychiatric/Behavioral: Negative.   All other systems reviewed and are negative.     Allergies  Sulfonamide derivatives; Penicillins; and Hydromorphone hcl  Home Medications   Prior to Admission medications   Medication Sig Start Date End Date Taking? Authorizing Provider  ALPRAZolam (XANAX) 0.5 MG tablet TAKE 1 TABLET 3 TIMES DAILY AS NEEDED. Patient taking differently: TAKE 1 TABLET 3 TIMES DAILY AS NEEDED  for anxiety 10/01/15  Yes Gordy Savers, MD  lisinopril (PRINIVIL,ZESTRIL) 20 MG tablet take 1 tablet by mouth once daily WITH BREAKFAST 05/14/15  Yes Gordy Savers, MD  acetaminophen (TYLENOL) 500 MG tablet Take 500 mg by mouth every 6 (six) hours as needed for mild pain.     Historical Provider, MD  Ca Carbonate-Mag Hydroxide (ROLAIDS PO) Take 2 tablets by mouth daily as needed (indigestion).    Historical Provider, MD  clotrimazole-betamethasone  (LOTRISONE) cream Apply 1 application topically 2 (two) times daily. Patient not taking: Reported on 11/05/2015 02/19/15   Gordy Savers, MD  traMADol (ULTRAM) 50 MG tablet Take 1 tablet (50 mg total) by mouth every 6 (six) hours as needed. Patient not taking: Reported on 11/05/2015 08/03/15   Gordy Savers, MD   BP 162/62 mmHg  Pulse 72  Temp(Src) 97.9 F (36.6 C) (Oral)  Resp 17  Ht  (1.6 m)  Wt 100 lb (45.36 kg)  BMI 17.72 kg/m2  SpO2 100% Physical Exam  Constitutional: She appears well-developed and well-nourished.  HENT:  Head: Normocephalic and atraumatic.  Eyes: Conjunctivae are normal. Pupils are equal, round, and reactive to light.  Neck: Neck supple. No tracheal deviation present. No thyromegaly present.  Cardiovascular: Normal rate and regular rhythm.   No murmur heard. Pulmonary/Chest: Effort normal and breath sounds normal.  Skin is exquisitely tender at area immediately inferior to left breast. There is no obvious mass. No rash.  Abdominal: Soft. Bowel sounds are normal. She exhibits no distension. There is no tenderness.  Musculoskeletal: Normal range of motion. She exhibits no edema or tenderness.  Neurological: She is alert. Coordination normal.  Skin: Skin is warm and dry. No rash noted.  Psychiatric: She has a normal mood and affect.  Nursing note and vitals reviewed.   ED Course  Procedures (including critical care time) Labs Review Labs Reviewed - No data to display  Imaging Review No results found. I have personally reviewed and evaluated these images and lab results as part of my medical decision-making.   EKG Interpretation None      MDM  I suspect this is maybe shingles however no rash yet. She'll be prescribed tramadol as. She's advised to follow-up with Dr. Amador Cunas at his office later this week. Blood pressure recheck. She's been advised to get help with her alcohol problem. Blood pressure recheck it office visit with Dr.  Shawnie Pons  Final diagnoses:  None  Dx #1 chest wall pain #2 elevated blood pressure      Doug Sou, MD 11/05/15 1436

## 2015-11-05 NOTE — ED Notes (Addendum)
Pt c/o severe pain from left back radiating around to under left breast-- tender to touch--- pain is on outside not inside-- pt has had shingles twice, no rash noted. States "even a bra hurts"

## 2015-11-05 NOTE — Telephone Encounter (Signed)
Yes, that is fine if she has to be seen on Wed. Can he see her Thurs?

## 2015-11-05 NOTE — Telephone Encounter (Signed)
Patient Name: Bjorn LoserDIANE Branscome  DOB: 1951-04-08    Initial Comment Caller states, wife has pain in Lt chest , wants appt    Nurse Assessment  Nurse: Scarlette ArStandifer, RN, Heather Date/Time (Eastern Time): 11/05/2015 9:40:19 AM  Confirm and document reason for call. If symptomatic, describe symptoms. You must click the next button to save text entered. ---Caller states that she started with chest tenderness about 3 days ago under her left breast, it hurts all the time and is tender to the touch.  Has the patient traveled out of the country within the last 30 days? ---Not Applicable  Does the patient have any new or worsening symptoms? ---Yes  Will a triage be completed? ---Yes  Related visit to physician within the last 2 weeks? ---No  Does the PT have any chronic conditions? (i.e. diabetes, asthma, etc.) ---Yes  List chronic conditions. ---See MR  Is this a behavioral health or substance abuse call? ---No     Guidelines    Guideline Title Affirmed Question Affirmed Notes  Chest Pain [1] Chest pain lasts > 5 minutes AND [2] age > 6750    Final Disposition User   Call EMS 911 Now Standifer, RN, Insurance underwriterHeather    Comments  Caller states that he is going to take his wife to the ED, she will not go by ambulance.   [facility]   Disagree/Comply: Comply

## 2015-11-07 ENCOUNTER — Ambulatory Visit (INDEPENDENT_AMBULATORY_CARE_PROVIDER_SITE_OTHER): Payer: BLUE CROSS/BLUE SHIELD | Admitting: Internal Medicine

## 2015-11-07 ENCOUNTER — Encounter: Payer: Self-pay | Admitting: Internal Medicine

## 2015-11-07 ENCOUNTER — Ambulatory Visit (INDEPENDENT_AMBULATORY_CARE_PROVIDER_SITE_OTHER)
Admission: RE | Admit: 2015-11-07 | Discharge: 2015-11-07 | Disposition: A | Discharge status: Home / Self Care | Payer: BLUE CROSS/BLUE SHIELD | Source: Ambulatory Visit | Attending: Internal Medicine | Admitting: Internal Medicine

## 2015-11-07 VITALS — BP 150/70 | HR 63 | Temp 98.5°F | Resp 20 | Ht 63.0 in | Wt 106.0 lb

## 2015-11-07 DIAGNOSIS — I10 Essential (primary) hypertension: Secondary | ICD-10-CM | POA: Diagnosis not present

## 2015-11-07 DIAGNOSIS — R0789 Other chest pain: Secondary | ICD-10-CM | POA: Diagnosis not present

## 2015-11-07 NOTE — Patient Instructions (Addendum)
X-rays of left ribs as discussed  Take pain medications as directed  Call or return to clinic prn if these symptoms worsen or fail to improve as anticipated.

## 2015-11-07 NOTE — Progress Notes (Signed)
Subjective:    Patient ID: [name], female    DOB: June 30, 1951, 65 y.o.   MRN: [id number]  HPI  65 year old who presents with a day history of left anterolateral chest wall pain. This began Saturday without a prior history of trauma.  She describes chest wall tenderness to palpation.  Pain is aggravated by laying on the left side.  Pain is aggravated by coughing. Denies any shortness of breath, fever, productive cough and generally feels well except for the discomfort She has treated hypertension. She is referred to the ED by [facility] 2 days ago, but no radiographs were obtained  Past Medical History  Diagnosis Date  . Alcoholism (HCC) 02/02/2009  . ANXIETY 01/21/2007  . DIVERTICULOSIS 07/09/2007  . Duodenal ulcer hemorrhage perforated 2006  . HYPERTENSION 06/23/2007  . HYPONATREMIA 02/02/2009    better off diuretic  . INSOMNIA 05/31/2007  . OSTEOPOROSIS 01/21/2007  . Raynaud's syndrome 06/23/2007  . VITAMIN B12 DEFICIENCY 10/02/2009  . Zoster 2012    twice - left groin/vagina  . NSAID-induced duodenal ulcer   . Blood transfusion     hx of 2005   . Headache(784.0) 05/31/2007    pt denies at 08/12/11 preop visit   . Iron deficiency anemia     pt denies at 08/12/11 visit   . Recurrent upper respiratory infection (URI)     pt reports slight cold using Nyquil prn at bedtime      Social History   Social History  . Marital Status: Married    Spouse Name: N/A  . Number of Children: 1  . Years of Education: N/A   Occupational History  . FT student    Social History Main Topics  . Smoking status: Never Smoker   . Smokeless tobacco: Never Used  . Alcohol Use: 6.0 oz/week    10 Cans of beer per week     Comment: 12 oz  . Drug Use: No  . Sexual Activity: Not on file   Other Topics Concern  . Not on file   Social History Narrative    Past Surgical History  Procedure Laterality Date  . Tubal ligation  1977  . Upper gastrointestinal endoscopy  09/16/2007; 12/21/2008;  03/17/2011    2009: Normal 2010: Normal with enteroscopy2012: Gastrojejunal  anastomotic ulcer, gastric retention  . Colonoscopy  06/18/2007    angulated and stenotic sigmoid colon with severe sigmoid diverticulosis  . Exploratory laparotomy  08/03/2004    1) duodenal ulcer oversew, pyloroplasty, anterior, posterior and truncal vagotomies  . Exploratory laparotomy  10/20/2004    1) duodenal ulcer oversew  with exclusion2) side-side gastrojejunostomy 3) feeding jejunostomy  . Laparotomy  08/14/2011    Procedure: EXPLORATORY LAPAROTOMY;  Surgeon: Kandis Cocking, MD;  Location: [facility];  Service: General;  Laterality: N/A;  exploratory laparotomy, lysis of adhesions, revision gastrojejunostomy, upper endoscopy  . Laparoscopy  08/14/2011    Procedure: LAPAROSCOPY DIAGNOSTIC;  Surgeon: Kandis Cocking, MD;  Location: [facility];  Service: General;  Laterality: N/A;    Family History  Problem Relation Age of Onset  . Stroke Father     Allergies  Allergen Reactions  . Sulfonamide Derivatives Anaphylaxis and Rash  . Penicillins Other (See Comments)    Has patient had a PCN reaction causing immediate rash, facial/tongue/throat swelling, SOB or lightheadedness with hypotension: No Has patient had a PCN reaction causing severe rash involving mucus membranes or skin necrosis: No Has patient had a PCN reaction that required hospitalization  No Has patient had a PCN reaction occurring within the last 10 years: No If all of the above answers are "NO", then may proceed with Cephalosporin use.  Marland Kitchen Hydromorphone Hcl Rash    Current Outpatient Prescriptions on File Prior to Visit  Medication Sig Dispense Refill  . acetaminophen (TYLENOL) 500 MG tablet Take 500 mg by mouth every 6 (six) hours as needed for mild pain.     Marland Kitchen ALPRAZolam (XANAX) 0.5 MG tablet TAKE 1 TABLET 3 TIMES DAILY AS NEEDED. (Patient taking differently: TAKE 1 TABLET 3 TIMES DAILY AS NEEDED for anxiety) 60 tablet 2  . Ca Carbonate-Mag Hydroxide  (ROLAIDS PO) Take 2 tablets by mouth daily as needed (indigestion).    . clotrimazole-betamethasone (LOTRISONE) cream Apply 1 application topically 2 (two) times daily. 45 g 1  . lisinopril (PRINIVIL,ZESTRIL) 20 MG tablet take 1 tablet by mouth once daily WITH BREAKFAST 90 tablet 1  . traMADol (ULTRAM) 50 MG tablet Take 1 tablet (50 mg total) by mouth every 6 (six) hours as needed. 20 tablet 0   No current facility-administered medications on file prior to visit.    BP 150/70 mmHg  Pulse 63  Temp(Src) 98.5 F (36.9 C) (Oral)  Resp 20  Ht  (1.6 m)  Wt 106 lb (48.081 kg)  BMI 18.78 kg/m2  SpO2 98%     Review of Systems  Constitutional: Negative.   HENT: Negative for congestion, dental problem, hearing loss, rhinorrhea, sinus pressure, sore throat and tinnitus.   Eyes: Negative for pain, discharge and visual disturbance.  Respiratory: Negative for cough and shortness of breath.   Cardiovascular: Positive for chest pain. Negative for palpitations and leg swelling.  Gastrointestinal: Negative for nausea, vomiting, abdominal pain, diarrhea, constipation, blood in stool and abdominal distention.  Genitourinary: Negative for dysuria, urgency, frequency, hematuria, flank pain, vaginal bleeding, vaginal discharge, difficulty urinating, vaginal pain and pelvic pain.  Musculoskeletal: Negative for joint swelling, arthralgias and gait problem.  Skin: Negative for rash.  Neurological: Negative for dizziness, syncope, speech difficulty, weakness, numbness and headaches.  Hematological: Negative for adenopathy.  Psychiatric/Behavioral: Negative for behavioral problems, dysphoric mood and agitation. The patient is not nervous/anxious.        Objective:   Physical Exam  Constitutional: She is oriented to person, place, and time. She appears well-developed and well-nourished.  Thin frail no acute distress Afebrile O2 saturation 98% Pulse 63  HENT:  Head: Normocephalic.  Right Ear:  External ear normal.  Left Ear: External ear normal.  Mouth/Throat: Oropharynx is clear and moist.  Eyes: Conjunctivae and EOM are normal. Pupils are equal, round, and reactive to light.  Neck: Normal range of motion. Neck supple. No thyromegaly present.  Cardiovascular: Normal rate, regular rhythm, normal heart sounds and intact distal pulses.   Pulmonary/Chest: Effort normal and breath sounds normal. She exhibits tenderness.  Left breast was normal and nontender Patient had point tenderness over the left fifth and sixth rib area in the anterolateral chest wall area;  overlying skin was normal without bruising or rash  Abdominal: Soft. Bowel sounds are normal. She exhibits no mass. There is no tenderness.  Musculoskeletal: Normal range of motion.  Lymphadenopathy:    She has no cervical adenopathy.  Neurological: She is alert and oriented to person, place, and time.  Skin: Skin is warm and dry. No rash noted.  Psychiatric: She has a normal mood and affect. Her behavior is normal.          Assessment &  Plan:   Left anterolateral chest wall pain.  Will check a rib series.  Continue symptomatic treatment Hypertension, stable

## 2015-11-07 NOTE — Progress Notes (Signed)
Pre visit review using our clinic review tool, if applicable. No additional management support is needed unless otherwise documented below in the visit note. 

## 2015-11-15 ENCOUNTER — Other Ambulatory Visit: Payer: Self-pay

## 2015-11-16 ENCOUNTER — Other Ambulatory Visit: Payer: Self-pay | Admitting: Internal Medicine

## 2015-11-16 MED ORDER — TRAMADOL HCL 50 MG PO TABS: 50.0000 mg | ORAL_TABLET | Freq: Four times a day (QID) | ORAL | Status: DC | PRN

## 2015-11-16 NOTE — Telephone Encounter (Signed)
Rx has been call in. Gave #30 and advise pt to get an office visit for further refills

## 2015-11-16 NOTE — Telephone Encounter (Signed)
Pt would like to know why we did not refill her traMADol (ULTRAM) 50 MG tablet  [facility] / [address]  Pt states Dr Kirtland BouchardK gives to her for muscle spasms and she takes every 6 hours

## 2015-11-19 ENCOUNTER — Telehealth: Payer: Self-pay | Admitting: *Deleted

## 2015-11-19 MED ORDER — TRAMADOL HCL 50 MG PO TABS: 50.0000 mg | ORAL_TABLET | Freq: Four times a day (QID) | ORAL | Status: DC | PRN

## 2015-11-19 NOTE — Telephone Encounter (Signed)
Okay to refill Tramadol for pt?

## 2015-11-19 NOTE — Telephone Encounter (Signed)
ok 

## 2015-11-19 NOTE — Telephone Encounter (Signed)
[facility]  [address], [place] [phone] Refill request traMADol (ULTRAM) 50 MG tablet

## 2015-11-19 NOTE — Telephone Encounter (Signed)
Rx called in to pharmacy. 

## 2016-02-08 ENCOUNTER — Telehealth: Payer: Self-pay | Admitting: Family Medicine

## 2016-02-08 NOTE — Telephone Encounter (Signed)
Pt requesting Alprazolam. Last OF 11/07/15 No upcoming appt. Last Rx 10/01/15 x 2. Please Advise.

## 2016-02-11 MED ORDER — ALPRAZOLAM 0.5 MG PO TABS: ORAL_TABLET | ORAL | 2 refills | Status: DC

## 2016-02-11 NOTE — Telephone Encounter (Signed)
The [facility] has closed down. Pt needs  ALPRAZolam (XANAX) 0.5 MG tablet  sent to [facility] [address]   Looks like it was phoned in today, but [facility] [facility] not have

## 2016-02-15 ENCOUNTER — Telehealth: Payer: Self-pay | Admitting: Internal Medicine

## 2016-02-15 MED ORDER — ALPRAZOLAM 0.5 MG PO TABS: ORAL_TABLET | ORAL | 3 refills | Status: DC

## 2016-02-15 NOTE — Telephone Encounter (Signed)
Rx called in. Pharmacy will notify when Rx is ready.

## 2016-02-15 NOTE — Telephone Encounter (Signed)
Pts husband to check the status of the Rx.

## 2016-02-15 NOTE — Telephone Encounter (Signed)
Pls resend Rx for alprazolam   Pharm:  [facility]

## 2016-02-15 NOTE — Telephone Encounter (Signed)
Okay to refill? 

## 2016-04-03 ENCOUNTER — Telehealth: Payer: Self-pay | Admitting: Internal Medicine

## 2016-04-03 MED ORDER — LISINOPRIL 20 MG PO TABS: ORAL_TABLET | ORAL | 1 refills | Status: DC

## 2016-04-03 NOTE — Telephone Encounter (Signed)
[facility] to [facility] as requested  

## 2016-04-03 NOTE — Telephone Encounter (Signed)
° °  Pt pharmacy [facility] closed down. Pt need a refill on the following med   This will be a new  rx there pharmacy [facility] closed down   lisinopril (PRINIVIL,ZESTRIL) 20 MG tablet   Pharmacy [facility] [address]

## 2016-04-19 ENCOUNTER — Emergency Department (HOSPITAL_COMMUNITY)
Admission: EM | Admit: 2016-04-19 | Discharge: 2016-04-19 | Disposition: A | Discharge status: Home / Self Care | Payer: BLUE CROSS/BLUE SHIELD | Attending: Emergency Medicine | Admitting: Emergency Medicine

## 2016-04-19 ENCOUNTER — Emergency Department (HOSPITAL_COMMUNITY): Payer: BLUE CROSS/BLUE SHIELD

## 2016-04-19 ENCOUNTER — Encounter (HOSPITAL_COMMUNITY): Payer: Self-pay | Admitting: Physical Medicine and Rehabilitation

## 2016-04-19 DIAGNOSIS — Y939 Activity, unspecified: Secondary | ICD-10-CM | POA: Insufficient documentation

## 2016-04-19 DIAGNOSIS — Y999 Unspecified external cause status: Secondary | ICD-10-CM | POA: Diagnosis not present

## 2016-04-19 DIAGNOSIS — S6991XA Unspecified injury of right wrist, hand and finger(s), initial encounter: Secondary | ICD-10-CM | POA: Diagnosis present

## 2016-04-19 DIAGNOSIS — W1830XA Fall on same level, unspecified, initial encounter: Secondary | ICD-10-CM | POA: Diagnosis not present

## 2016-04-19 DIAGNOSIS — I10 Essential (primary) hypertension: Secondary | ICD-10-CM | POA: Insufficient documentation

## 2016-04-19 DIAGNOSIS — Y929 Unspecified place or not applicable: Secondary | ICD-10-CM | POA: Diagnosis not present

## 2016-04-19 DIAGNOSIS — S52611A Displaced fracture of right ulna styloid process, initial encounter for closed fracture: Secondary | ICD-10-CM

## 2016-04-19 DIAGNOSIS — S52501A Unspecified fracture of the lower end of right radius, initial encounter for closed fracture: Secondary | ICD-10-CM | POA: Diagnosis not present

## 2016-04-19 MED ORDER — OXYCODONE-ACETAMINOPHEN 5-325 MG PO TABS
1.0000 | ORAL_TABLET | Freq: Once | ORAL | Status: AC
  Administered 2016-04-19: 1 via ORAL
  Filled 2016-04-19: qty 1

## 2016-04-19 MED ORDER — FENTANYL CITRATE (PF) 100 MCG/2ML IJ SOLN
50.0000 ug | Freq: Once | INTRAMUSCULAR | Status: AC
  Administered 2016-04-19: 50 ug via INTRAMUSCULAR
  Filled 2016-04-19: qty 2

## 2016-04-19 MED ORDER — OXYCODONE-ACETAMINOPHEN 5-325 MG PO TABS: 2.0000 | ORAL_TABLET | ORAL | 0 refills | Status: DC | PRN

## 2016-04-19 NOTE — ED Triage Notes (Signed)
Pt states she fell out of recliner this morning at home. Now reports R wrist/hand pain and swelling.

## 2016-04-19 NOTE — Discharge Instructions (Signed)
Keep arm in splint and elevate as much as possible. Take pain medication as needed. Follow up with hand orthopedic provider for re-evaluation. Return to the ED if you experience severe worsening if your symptoms, numbness or tingling in you hand, fevers.

## 2016-04-19 NOTE — Progress Notes (Signed)
Orthopedic Tech Progress Note Patient Details:  [name] Nov 13, 1950 [id number]  Ortho Devices Type of Ortho Device: Ace wrap, Arm sling, Sugartong splint Ortho Device/Splint Location: rue Ortho Device/Splint Interventions: Application   [name] 04/19/2016, 10:37 AM

## 2016-04-19 NOTE — ED Provider Notes (Signed)
[facility]-EMERGENCY DEPT Provider Note   CSN: [id number] Arrival date & time: 04/19/16  0759     History   Chief Complaint Chief Complaint  Patient presents with  . Fall  . Wrist Pain    HPI [name] is a 65 y.o. female with past mental history of HTN who presents to the ED today complaining of right wrist pain. Patient states that around 11:30 PM last night she was attempting to get out of her recliner when she got tingled up in a blanket and fell onto her right outstretched hand. No other trauma or injury. She had immediate pain to her right wrist but initially did not notice any swelling so she went to sleep. When she woke up this morning she noticed that her right wrist and hand were very swollen and had significant bruising. Patient also reports significant increase in her pain. Patient has not tried taking anything for pain. She last ate or drank around dinner last night. Patient is not on blood thinners.  HPI  Past Medical History:  Diagnosis Date  . Alcoholism (HCC) 02/02/2009  . ANXIETY 01/21/2007  . Blood transfusion    hx of 2005   . DIVERTICULOSIS 07/09/2007  . Duodenal ulcer hemorrhage perforated 2006  . Headache(784.0) 05/31/2007   pt denies at 08/12/11 preop visit   . HYPERTENSION 06/23/2007  . HYPONATREMIA 02/02/2009   better off diuretic  . INSOMNIA 05/31/2007  . Iron deficiency anemia    pt denies at 08/12/11 visit   . NSAID-induced duodenal ulcer   . OSTEOPOROSIS 01/21/2007  . Raynaud's syndrome 06/23/2007  . Recurrent upper respiratory infection (URI)    pt reports slight cold using Nyquil prn at bedtime   . VITAMIN B12 DEFICIENCY 10/02/2009  . Zoster 2012   twice - left groin/vagina    Patient Active Problem List   Diagnosis Date Noted  . Marginal ulcer, at gastrojejunostomy, resected 08/14/2011. 09/19/2011  . LUQ abdominal pain near former jejunostomy site 03/04/2011  . 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  SHINGLES 08/19/2010  . B12 deficiency 10/02/2009  . HYPONATREMIA 02/02/2009  . Iron deficiency anemia 07/09/2007  . DIVERTICULOSIS 07/09/2007  . Essential hypertension 06/23/2007  . 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 INSOMNIA 05/31/2007  . HEADACHE 05/31/2007  . Anxiety state 01/21/2007  . Alcoholism (HCC) 01/21/2007  . OSTEOPOROSIS 01/21/2007    Past Surgical History:  Procedure Laterality Date  . COLONOSCOPY  06/18/2007   angulated and stenotic sigmoid colon with severe sigmoid diverticulosis  . EXPLORATORY LAPAROTOMY  08/03/2004   1) duodenal ulcer oversew, pyloroplasty, anterior, posterior and truncal vagotomies  . EXPLORATORY LAPAROTOMY  10/20/2004   1) duodenal ulcer oversew  with exclusion2) side-side gastrojejunostomy 3) feeding jejunostomy  . LAPAROSCOPY  08/14/2011   Procedure: LAPAROSCOPY DIAGNOSTIC;  Surgeon: Kandis Cockingavid H Newman, MD;  Location: [facility];  Service: General;  Laterality: N/A;  . LAPAROTOMY  08/14/2011   Procedure: EXPLORATORY LAPAROTOMY;  Surgeon: Kandis Cockingavid H Newman, MD;  Location: [facility];  Service: General;  Laterality: N/A;  exploratory laparotomy, lysis of adhesions, revision gastrojejunostomy, upper endoscopy  . TUBAL LIGATION  1977  . UPPER GASTROINTESTINAL ENDOSCOPY  09/16/2007; 12/21/2008; 03/17/2011   2009: Normal 2010: Normal with enteroscopy2012: Gastrojejunal  anastomotic ulcer, gastric retention    OB History    No data available       Home Medications    Prior to Admission medications   Medication Sig Start Date End Date Taking? Authorizing  Provider  acetaminophen (TYLENOL) 500 MG tablet Take 500 mg by mouth every 6 (six) hours as needed for mild pain.     Historical Provider, MD  ALPRAZolam Prudy Feeler) 0.5 MG tablet TAKE 1 TABLET 3 TIMES DAILY AS NEEDED. 02/15/16   Gordy Savers, MD  Ca Carbonate-Mag Hydroxide (ROLAIDS PO) Take 2 tablets by mouth daily as needed (indigestion).    Historical Provider, MD  clotrimazole-betamethasone (LOTRISONE) cream  Apply 1 application topically 2 (two) times daily. 02/19/15   Gordy Savers, MD  lisinopril (PRINIVIL,ZESTRIL) 20 MG tablet take 1 tablet by mouth once daily WITH BREAKFAST 04/03/16   Gordy Savers, MD  traMADol (ULTRAM) 50 MG tablet Take 1 tablet (50 mg total) by mouth every 6 (six) hours as needed. 11/19/15   Gordy Savers, MD    Family History Family History  Problem Relation Age of Onset  . Stroke Father     Social History Social History  Substance Use Topics  . Smoking status: Never Smoker  . Smokeless tobacco: Never Used  . Alcohol use 6.0 oz/week    10 Cans of beer per week     Comment: 12 oz     Allergies   Sulfonamide derivatives; Penicillins; and Hydromorphone hcl   Review of Systems Review of Systems  All other systems reviewed and are negative.    Physical Exam Updated Vital Signs BP 173/83 (BP Location: Left Arm)   Pulse 74   Temp 98.8 F (37.1 C) (Oral)   Resp 18   Ht 5\' 3"  (1.6 m)   Wt 46.7 kg   SpO2 99%   BMI 18.25 kg/m   Physical Exam  Constitutional: She is oriented to person, place, and time. She appears well-developed and well-nourished. No distress.  HENT:  Head: Normocephalic and atraumatic.  Eyes: Conjunctivae are normal. Right eye exhibits no discharge. Left eye exhibits no discharge. No scleral icterus.  Cardiovascular: Normal rate.   Pulmonary/Chest: Effort normal.  Musculoskeletal: She exhibits tenderness and deformity.  Significant swelling and ecchymosis to dorsal aspect of right hand, right wrist and distal forearm. Decreased range of motion of wrist limited by pain. No TTP over elbow. Normal range of motion of elbow. Sensation intact.  Neurological: She is alert and oriented to person, place, and time. Coordination normal.  Skin: Skin is warm and dry. No rash noted. She is not diaphoretic. No erythema. No pallor.  Psychiatric: She has a normal mood and affect. Her behavior is normal.  Nursing note and vitals  reviewed.    ED Treatments / Results  Labs (all labs ordered are listed, but only abnormal results are displayed) Labs Reviewed - No data to display  EKG  EKG Interpretation None       Radiology Dg Elbow Complete Right  Result Date: 04/19/2016 CLINICAL DATA:  Fall onto right out streteched right hand, pain, bruising and swelling to medial side of right hand and wrist. Patient denies any pain in her right elbow. EXAM: RIGHT ELBOW - COMPLETE 3+ VIEW COMPARISON:  None. FINDINGS: No fracture.  No bone lesion. Elbow joint is normally spaced and aligned.  No joint effusion. Soft tissues are unremarkable. IMPRESSION: Negative. Electronically Signed   By: Amie Portland M.D.   On: 04/19/2016 09:24   Dg Wrist Complete Right  Result Date: 04/19/2016 CLINICAL DATA:  Fall onto right out streteched right hand, pain, bruising and swelling to medial side of right hand and wrist. Patient denies any pain in her right elbow. EXAM:  RIGHT WRIST - COMPLETE 3+ VIEW COMPARISON:  None. FINDINGS: There is a transverse fracture of the distal radial metaphysis, with mild dorsal displacement of approximately 4 mm, and dorsal angulation of the distal radial articular surface of approximately 10 degrees. A component of the fracture intersects the dorsal ulnar aspect a of the distal radial articular surface. There is an associated fracture of the ulnar styloid. Joints are normally aligned. Soft tissue swelling is seen diffusely. IMPRESSION: 1. Fractures of the distal radius and ulnar styloid as detailed. No dislocation. Electronically Signed   By: Amie Portland M.D.   On: 04/19/2016 09:21   Dg Hand Complete Right  Result Date: 04/19/2016 CLINICAL DATA:  Fall onto right out streteched right hand, pain, bruising and swelling to medial side of right hand and wrist. Patient denies any pain in her right elbow. EXAM: RIGHT HAND - COMPLETE 3+ VIEW COMPARISON:  None. FINDINGS: Fractures of the distal radius and ulnar styloid are  again noted described under the right wrist radiographs. There are no hand fractures. Hand joints are normally aligned. Bones are demineralized. Soft tissues are unremarkable. IMPRESSION: 1. No right hand fracture and no dislocation. Distal radius and ulnar styloid fractures. Electronically Signed   By: Amie Portland M.D.   On: 04/19/2016 09:23    Procedures Procedures (including critical care time)  Medications Ordered in ED Medications  fentaNYL (SUBLIMAZE) injection 50 mcg (not administered)     Initial Impression / Assessment and Plan / ED Course  I have reviewed the triage vital signs and the nursing notes.  Pertinent labs & imaging results that were available during my care of the patient were reviewed by me and considered in my medical decision making (see chart for details).  Clinical Course    Otherwise healthy 65 year old female presents to the ED today complaining of wrist injury that occurred last night around 11:30. Patient felt onto outstretched hand out of a recliner. No other trauma or injury noted. Presentation to ED there is obvious swelling, ecchymosis and deformity of right wrist. Patient is neurovascularly intact. X-ray reveals fractures of the distal radius and ulnar styloid process, mild dorsal displacement and dorsal angulation of the distal radial articular surface. Do not feel this needs to be emergently reduced. Patient placed in sugar tong splint and given arm sling. Pain adequately managed in the ED. Recommend follow-up with hand orthopedic surgery as soon as possible for reevaluation. Return precautions outlined in patient discharge instructions.  Patient was discussed with and seen by Dr. Particia Nearing who agrees with the treatment plan.    Final Clinical Impressions(s) / ED Diagnoses   Final diagnoses:  None    New Prescriptions New Prescriptions   No medications on file     Dub Mikes, PA-C 04/19/16 1126    Jacalyn Lefevre, MD 04/19/16  1234

## 2016-04-23 ENCOUNTER — Other Ambulatory Visit: Payer: Self-pay | Admitting: Orthopedic Surgery

## 2016-04-23 ENCOUNTER — Encounter (HOSPITAL_BASED_OUTPATIENT_CLINIC_OR_DEPARTMENT_OTHER): Payer: Self-pay | Admitting: *Deleted

## 2016-04-25 ENCOUNTER — Encounter (HOSPITAL_BASED_OUTPATIENT_CLINIC_OR_DEPARTMENT_OTHER)
Admission: RE | Admit: 2016-04-25 | Discharge: 2016-04-25 | Disposition: A | Discharge status: Home / Self Care | Payer: BLUE CROSS/BLUE SHIELD | Source: Ambulatory Visit | Attending: Orthopedic Surgery | Admitting: Orthopedic Surgery

## 2016-04-25 DIAGNOSIS — I10 Essential (primary) hypertension: Secondary | ICD-10-CM | POA: Diagnosis present

## 2016-04-25 LAB — BASIC METABOLIC PANEL
Anion gap: 11 (ref 5–15)
BUN: 8 mg/dL (ref 6–20)
CALCIUM: 9.2 mg/dL (ref 8.9–10.3)
CO2: 26 mmol/L (ref 22–32)
CREATININE: 0.52 mg/dL (ref 0.44–1.00)
Chloride: 96 mmol/L — ABNORMAL LOW (ref 101–111)
GFR calc Af Amer: >60 mL/min (ref >60)
Glucose, Bld: 87 mg/dL (ref 65–99)
POTASSIUM: 4.1 mmol/L (ref 3.5–5.1)
SODIUM: 133 mmol/L — AB (ref 135–145)

## 2016-04-25 NOTE — Progress Notes (Signed)
EKG read by Dr. Okey Dupreose, will proceed with surgery as planned.

## 2016-05-01 ENCOUNTER — Ambulatory Visit (HOSPITAL_BASED_OUTPATIENT_CLINIC_OR_DEPARTMENT_OTHER)
Admission: RE | Admit: 2016-05-01 | Discharge: 2016-05-01 | Disposition: A | Discharge status: Home / Self Care | Payer: BLUE CROSS/BLUE SHIELD | Source: Ambulatory Visit | Attending: Orthopedic Surgery | Admitting: Orthopedic Surgery

## 2016-05-01 ENCOUNTER — Encounter (HOSPITAL_BASED_OUTPATIENT_CLINIC_OR_DEPARTMENT_OTHER): Payer: Self-pay | Admitting: *Deleted

## 2016-05-01 ENCOUNTER — Encounter (HOSPITAL_BASED_OUTPATIENT_CLINIC_OR_DEPARTMENT_OTHER)
Admission: RE | Disposition: A | Discharge status: Home / Self Care | Payer: Self-pay | Source: Ambulatory Visit | Attending: Orthopedic Surgery

## 2016-05-01 ENCOUNTER — Ambulatory Visit (HOSPITAL_BASED_OUTPATIENT_CLINIC_OR_DEPARTMENT_OTHER): Payer: BLUE CROSS/BLUE SHIELD | Admitting: Certified Registered"

## 2016-05-01 DIAGNOSIS — Y998 Other external cause status: Secondary | ICD-10-CM | POA: Insufficient documentation

## 2016-05-01 DIAGNOSIS — E538 Deficiency of other specified B group vitamins: Secondary | ICD-10-CM | POA: Diagnosis not present

## 2016-05-01 DIAGNOSIS — Y92009 Unspecified place in unspecified non-institutional (private) residence as the place of occurrence of the external cause: Secondary | ICD-10-CM | POA: Insufficient documentation

## 2016-05-01 DIAGNOSIS — I10 Essential (primary) hypertension: Secondary | ICD-10-CM | POA: Insufficient documentation

## 2016-05-01 DIAGNOSIS — M810 Age-related osteoporosis without current pathological fracture: Secondary | ICD-10-CM | POA: Diagnosis not present

## 2016-05-01 DIAGNOSIS — Z882 Allergy status to sulfonamides status: Secondary | ICD-10-CM | POA: Insufficient documentation

## 2016-05-01 DIAGNOSIS — I7300 Raynaud's syndrome without gangrene: Secondary | ICD-10-CM | POA: Diagnosis not present

## 2016-05-01 DIAGNOSIS — Z823 Family history of stroke: Secondary | ICD-10-CM | POA: Insufficient documentation

## 2016-05-01 DIAGNOSIS — Z8719 Personal history of other diseases of the digestive system: Secondary | ICD-10-CM | POA: Insufficient documentation

## 2016-05-01 DIAGNOSIS — Z885 Allergy status to narcotic agent status: Secondary | ICD-10-CM | POA: Insufficient documentation

## 2016-05-01 DIAGNOSIS — Z880 Allergy status to penicillin: Secondary | ICD-10-CM | POA: Diagnosis not present

## 2016-05-01 DIAGNOSIS — Y9389 Activity, other specified: Secondary | ICD-10-CM | POA: Insufficient documentation

## 2016-05-01 DIAGNOSIS — S52571A Other intraarticular fracture of lower end of right radius, initial encounter for closed fracture: Secondary | ICD-10-CM | POA: Insufficient documentation

## 2016-05-01 DIAGNOSIS — W07XXXA Fall from chair, initial encounter: Secondary | ICD-10-CM | POA: Insufficient documentation

## 2016-05-01 HISTORY — PX: OPEN REDUCTION INTERNAL FIXATION (ORIF) DISTAL RADIAL FRACTURE: SHX5989

## 2016-05-01 SURGERY — OPEN REDUCTION INTERNAL FIXATION (ORIF) DISTAL RADIUS FRACTURE
Anesthesia: General | Site: Wrist | Laterality: Right

## 2016-05-01 MED ORDER — SCOPOLAMINE 1 MG/3DAYS TD PT72: 1.0000 | MEDICATED_PATCH | Freq: Once | TRANSDERMAL | Status: DC | PRN

## 2016-05-01 MED ORDER — LIDOCAINE 2% (20 MG/ML) 5 ML SYRINGE
INTRAMUSCULAR | Status: DC | PRN
  Administered 2016-05-01: 60 mg via INTRAVENOUS

## 2016-05-01 MED ORDER — BUPIVACAINE-EPINEPHRINE (PF) 0.5% -1:200000 IJ SOLN
INTRAMUSCULAR | Status: DC | PRN
  Administered 2016-05-01: 25 mL via PERINEURAL

## 2016-05-01 MED ORDER — FENTANYL CITRATE (PF) 100 MCG/2ML IJ SOLN
INTRAMUSCULAR | Status: AC
  Filled 2016-05-01: qty 2

## 2016-05-01 MED ORDER — FENTANYL CITRATE (PF) 100 MCG/2ML IJ SOLN
50.0000 ug | INTRAMUSCULAR | Status: DC | PRN
  Administered 2016-05-01: 25 ug via INTRAVENOUS
  Administered 2016-05-01: 50 ug via INTRAVENOUS

## 2016-05-01 MED ORDER — ONDANSETRON HCL 4 MG/2ML IJ SOLN
INTRAMUSCULAR | Status: AC
  Filled 2016-05-01: qty 2

## 2016-05-01 MED ORDER — ONDANSETRON 8 MG PO TBDP
8.0000 mg | ORAL_TABLET | Freq: Once | ORAL | Status: AC
  Administered 2016-05-01: 8 mg via ORAL

## 2016-05-01 MED ORDER — DEXAMETHASONE SODIUM PHOSPHATE 4 MG/ML IJ SOLN
INTRAMUSCULAR | Status: DC | PRN
  Administered 2016-05-01: 10 mg via INTRAVENOUS

## 2016-05-01 MED ORDER — GLYCOPYRROLATE 0.2 MG/ML IJ SOLN: 0.2000 mg | Freq: Once | INTRAMUSCULAR | Status: DC | PRN

## 2016-05-01 MED ORDER — VANCOMYCIN HCL IN DEXTROSE 1-5 GM/200ML-% IV SOLN
1000.0000 mg | INTRAVENOUS | Status: AC
  Administered 2016-05-01: 1000 mg via INTRAVENOUS

## 2016-05-01 MED ORDER — FENTANYL CITRATE (PF) 100 MCG/2ML IJ SOLN
25.0000 ug | INTRAMUSCULAR | Status: DC | PRN
  Administered 2016-05-01: 50 ug via INTRAVENOUS
  Administered 2016-05-01: 25 ug via INTRAVENOUS

## 2016-05-01 MED ORDER — MEPERIDINE HCL 25 MG/ML IJ SOLN: 6.2500 mg | INTRAMUSCULAR | Status: DC | PRN

## 2016-05-01 MED ORDER — PROPOFOL 10 MG/ML IV BOLUS
INTRAVENOUS | Status: AC
  Filled 2016-05-01: qty 20

## 2016-05-01 MED ORDER — OXYCODONE-ACETAMINOPHEN 5-325 MG PO TABS: ORAL_TABLET | ORAL | 0 refills | Status: DC

## 2016-05-01 MED ORDER — LIDOCAINE 2% (20 MG/ML) 5 ML SYRINGE
INTRAMUSCULAR | Status: AC
  Filled 2016-05-01: qty 5

## 2016-05-01 MED ORDER — ONDANSETRON 4 MG PO TBDP
ORAL_TABLET | ORAL | Status: AC
  Filled 2016-05-01: qty 2

## 2016-05-01 MED ORDER — MIDAZOLAM HCL 2 MG/2ML IJ SOLN
INTRAMUSCULAR | Status: AC
  Filled 2016-05-01: qty 2

## 2016-05-01 MED ORDER — PROPOFOL 10 MG/ML IV BOLUS
INTRAVENOUS | Status: DC | PRN
  Administered 2016-05-01: 170 mg via INTRAVENOUS

## 2016-05-01 MED ORDER — VANCOMYCIN HCL IN DEXTROSE 1-5 GM/200ML-% IV SOLN
INTRAVENOUS | Status: AC
  Filled 2016-05-01: qty 200

## 2016-05-01 MED ORDER — DEXAMETHASONE SODIUM PHOSPHATE 10 MG/ML IJ SOLN
INTRAMUSCULAR | Status: AC
  Filled 2016-05-01: qty 1

## 2016-05-01 MED ORDER — CHLORHEXIDINE GLUCONATE 4 % EX LIQD: 60.0000 mL | Freq: Once | CUTANEOUS | Status: DC

## 2016-05-01 MED ORDER — LACTATED RINGERS IV SOLN
INTRAVENOUS | Status: DC
  Administered 2016-05-01: 10 mL/h via INTRAVENOUS

## 2016-05-01 MED ORDER — MIDAZOLAM HCL 2 MG/2ML IJ SOLN
1.0000 mg | INTRAMUSCULAR | Status: DC | PRN
  Administered 2016-05-01: 2 mg via INTRAVENOUS

## 2016-05-01 SURGICAL SUPPLY — 74 items
BANDAGE ACE 3X5.8 VEL STRL LF (GAUZE/BANDAGES/DRESSINGS) ×3 IMPLANT
BIT DRILL 2.0 LNG QUCK RELEASE (BIT) ×1 IMPLANT
BIT DRILL 2.8 QUICK RELEASE (BIT) ×1 IMPLANT
BLADE SURG 15 STRL LF DISP TIS (BLADE) ×2 IMPLANT
BLADE SURG 15 STRL SS (BLADE) ×4
BNDG ESMARK 4X9 LF (GAUZE/BANDAGES/DRESSINGS) ×3 IMPLANT
BNDG GAUZE ELAST 4 BULKY (GAUZE/BANDAGES/DRESSINGS) ×3 IMPLANT
BNDG PLASTER X FAST 3X3 WHT LF (CAST SUPPLIES) ×30 IMPLANT
CHLORAPREP W/TINT 26ML (MISCELLANEOUS) ×3 IMPLANT
CORDS BIPOLAR (ELECTRODE) ×3 IMPLANT
COVER BACK TABLE 60X90IN (DRAPES) ×3 IMPLANT
COVER MAYO STAND STRL (DRAPES) ×3 IMPLANT
CUFF TOURNIQUET SINGLE 18IN (TOURNIQUET CUFF) ×3 IMPLANT
CUFF TOURNIQUET SINGLE 24IN (TOURNIQUET CUFF) IMPLANT
DRAPE EXTREMITY T 121X128X90 (DRAPE) ×3 IMPLANT
DRAPE OEC MINIVIEW 54X84 (DRAPES) ×3 IMPLANT
DRAPE SURG 17X23 STRL (DRAPES) ×3 IMPLANT
DRILL 2.0 LNG QUICK RELEASE (BIT) ×3
DRILL 2.8 QUICK RELEASE (BIT) ×3
GAUZE SPONGE 4X4 12PLY STRL (GAUZE/BANDAGES/DRESSINGS) ×3 IMPLANT
GAUZE XEROFORM 1X8 LF (GAUZE/BANDAGES/DRESSINGS) ×3 IMPLANT
GLOVE BIO SURGEON STRL SZ 6.5 (GLOVE) ×2 IMPLANT
GLOVE BIO SURGEON STRL SZ7.5 (GLOVE) ×3 IMPLANT
GLOVE BIO SURGEONS STRL SZ 6.5 (GLOVE) ×1
GLOVE BIOGEL PI IND STRL 7.0 (GLOVE) ×2 IMPLANT
GLOVE BIOGEL PI IND STRL 8 (GLOVE) ×2 IMPLANT
GLOVE BIOGEL PI IND STRL 8.5 (GLOVE) ×1 IMPLANT
GLOVE BIOGEL PI INDICATOR 7.0 (GLOVE) ×4
GLOVE BIOGEL PI INDICATOR 8 (GLOVE) ×4
GLOVE BIOGEL PI INDICATOR 8.5 (GLOVE) ×2
GLOVE SURG ORTHO 8.0 STRL STRW (GLOVE) ×3 IMPLANT
GLOVE SURG SS PI 8.0 STRL IVOR (GLOVE) ×3 IMPLANT
GOWN STRL REUS W/ TWL LRG LVL3 (GOWN DISPOSABLE) ×1 IMPLANT
GOWN STRL REUS W/ TWL XL LVL3 (GOWN DISPOSABLE) ×1 IMPLANT
GOWN STRL REUS W/TWL LRG LVL3 (GOWN DISPOSABLE) ×2
GOWN STRL REUS W/TWL XL LVL3 (GOWN DISPOSABLE) ×5 IMPLANT
GUIDEWIRE ORTHO 0.054X6 (WIRE) ×9 IMPLANT
NEEDLE HYPO 25X1 1.5 SAFETY (NEEDLE) IMPLANT
NS IRRIG 1000ML POUR BTL (IV SOLUTION) ×3 IMPLANT
PACK BASIN DAY SURGERY FS (CUSTOM PROCEDURE TRAY) ×3 IMPLANT
PAD CAST 3X4 CTTN HI CHSV (CAST SUPPLIES) ×1 IMPLANT
PADDING CAST ABS 4INX4YD NS (CAST SUPPLIES)
PADDING CAST ABS COTTON 4X4 ST (CAST SUPPLIES) IMPLANT
PADDING CAST COTTON 3X4 STRL (CAST SUPPLIES) ×2
PLATE ACULOCK 2 NARROW RT (Plate) ×3 IMPLANT
SCREW ACTK 2 NL HEX 3.5.11 (Screw) ×3 IMPLANT
SCREW CORT FT 18X2.3XLCK HEX (Screw) ×1 IMPLANT
SCREW CORT FT 20X2.3XLCK HEX (Screw) ×1 IMPLANT
SCREW CORTICAL LOCKING 2.3X18M (Screw) ×8 IMPLANT
SCREW CORTICAL LOCKING 2.3X20M (Screw) ×4 IMPLANT
SCREW FX18X2.3XSMTH LCK NS CRT (Screw) ×3 IMPLANT
SCREW FX20X2.3XSMTH LCK NS CRT (Screw) ×1 IMPLANT
SCREW NONLOCK HEX 3.5X12 (Screw) ×6 IMPLANT
SLEEVE SCD COMPRESS KNEE MED (MISCELLANEOUS) ×3 IMPLANT
SLING ARM FOAM STRAP MED (SOFTGOODS) ×3 IMPLANT
SPLINT PLASTER CAST XFAST 3X15 (CAST SUPPLIES) ×8 IMPLANT
SPLINT PLASTER XTRA FASTSET 3X (CAST SUPPLIES) ×16
STOCKINETTE 4X48 STRL (DRAPES) ×3 IMPLANT
SUCTION FRAZIER HANDLE 10FR (MISCELLANEOUS)
SUCTION TUBE FRAZIER 10FR DISP (MISCELLANEOUS) IMPLANT
SUT ETHILON 3 0 PS 1 (SUTURE) ×6 IMPLANT
SUT ETHILON 4 0 PS 2 18 (SUTURE) IMPLANT
SUT VIC AB 2-0 SH 27 (SUTURE)
SUT VIC AB 2-0 SH 27XBRD (SUTURE) IMPLANT
SUT VIC AB 3-0 PS1 18 (SUTURE)
SUT VIC AB 3-0 PS1 18XBRD (SUTURE) IMPLANT
SUT VICRYL 4-0 PS2 18IN ABS (SUTURE) ×3 IMPLANT
SYR BULB 3OZ (MISCELLANEOUS) ×3 IMPLANT
SYR CONTROL 10ML LL (SYRINGE) IMPLANT
TOWEL OR 17X24 6PK STRL BLUE (TOWEL DISPOSABLE) ×6 IMPLANT
TOWEL OR NON WOVEN STRL DISP B (DISPOSABLE) ×3 IMPLANT
TUBE CONNECTING 20'X1/4 (TUBING)
TUBE CONNECTING 20X1/4 (TUBING) IMPLANT
UNDERPAD 30X30 (UNDERPADS AND DIAPERS) IMPLANT

## 2016-05-01 NOTE — Anesthesia Preprocedure Evaluation (Signed)
Anesthesia Evaluation  Patient identified by MRN, date of birth, ID band Patient awake    Reviewed: Allergy & Precautions, NPO status , Patient's Chart, lab work & pertinent test results  Airway Mallampati: I  TM Distance: >3 FB Neck ROM: Full    Dental  (+) Teeth Intact, Dental Advisory Given   Pulmonary    breath sounds clear to auscultation       Cardiovascular hypertension, Pt. on medications  Rhythm:Regular Rate:Normal     Neuro/Psych    GI/Hepatic PUD,   Endo/Other    Renal/GU      Musculoskeletal   Abdominal   Peds  Hematology   Anesthesia Other Findings   Reproductive/Obstetrics                             Anesthesia Physical Anesthesia Plan  ASA: II  Anesthesia Plan: General   Post-op Pain Management: GA combined w/ Regional for post-op pain   Induction: Intravenous  Airway Management Planned: LMA  Additional Equipment:   Intra-op Plan:   Post-operative Plan: Extubation in OR  Informed Consent: I have reviewed the patients History and Physical, chart, labs and discussed the procedure including the risks, benefits and alternatives for the proposed anesthesia with the patient or authorized representative who has indicated his/her understanding and acceptance.   Dental advisory given  Plan Discussed with: CRNA, Anesthesiologist and Surgeon  Anesthesia Plan Comments:         Anesthesia Quick Evaluation

## 2016-05-01 NOTE — Anesthesia Postprocedure Evaluation (Signed)
Anesthesia Post Note  Patient: Annabelle HarmanDiane M Pennock  Procedure(s) Performed: Procedure(s) (LRB): OPEN REDUCTION INTERNAL FIXATION (ORIF) DISTAL RADIAL FRACTURE, right (Right)  Patient location during evaluation: PACU Anesthesia Type: General Level of consciousness: awake and alert Pain management: pain level controlled Vital Signs Assessment: post-procedure vital signs reviewed and stable Respiratory status: spontaneous breathing, nonlabored ventilation and respiratory function stable Cardiovascular status: blood pressure returned to baseline and stable Postop Assessment: no signs of nausea or vomiting Anesthetic complications: no    Last Vitals:  Vitals:   05/01/16 1445 05/01/16 1530  BP: (!) 162/85 (!) 149/62  Pulse: (!) 51 (!) 57  Resp: 17 16  Temp:  36.8 C    Last Pain:  Vitals:   05/01/16 1530  TempSrc:   PainSc: 3                  Rodell Marrs A

## 2016-05-01 NOTE — Anesthesia Procedure Notes (Addendum)
Anesthesia Regional Block:  Infraclavicular brachial plexus block  Pre-Anesthetic Checklist: ,, timeout performed, Correct Patient, Correct Site, Correct Laterality, Correct Procedure, Correct Position, site marked, Risks and benefits discussed,  Surgical consent,  Pre-op evaluation,  At surgeon's request and post-op pain management  Laterality: Right and Upper  Prep: chloraprep       Needles:  Injection technique: Single-shot  Needle Type: Echogenic Stimulator Needle     Needle Length: 5cm 5 cm Needle Gauge: 21 and 21 G    Additional Needles:  Procedures: ultrasound guided (picture in chart) Infraclavicular brachial plexus block Narrative:  Start time: 05/01/2016 12:11 PM End time: 05/01/2016 12:15 PM Injection made incrementally with aspirations every 5 mL.  Performed by: Personally  Anesthesiologist: Alejandrina Raimer      Right infraclavicular block image

## 2016-05-01 NOTE — H&P (Signed)
[name] is an 65 y.o. female.   Chief Complaint: right distal radius fracture HPI: 65 yo female states she fell out of recliner 04/18/16 injuring right wrist.  [facility]R revealed distal radius fracture.  Splinted and followed up in office.  She reports no previous injury to wrist and no other injury at this time.  Allergies:  Allergies  Allergen Reactions  . Sulfonamide Derivatives Anaphylaxis and Rash  . Penicillins Other (See Comments)    Has patient had a PCN reaction causing immediate rash, facial/tongue/throat swelling, SOB or lightheadedness with hypotension: No Has patient had a PCN reaction causing severe rash involving mucus membranes or skin necrosis: No Has patient had a PCN reaction that required hospitalization No Has patient had a PCN reaction occurring within the last 10 years: No If all of the above answers are "NO", then may proceed with Cephalosporin use.  [name]. Hydromorphone Hcl Rash    Past Medical History:  Diagnosis Date  . Alcoholism (HCC) 02/02/2009  . ANXIETY 01/21/2007  . Blood transfusion    hx of 2005   . DIVERTICULOSIS 07/09/2007  . Duodenal ulcer hemorrhage perforated 2006  . Headache(784.0) 05/31/2007   pt denies at 08/12/11 preop visit   . HYPERTENSION 06/23/2007  . HYPONATREMIA 02/02/2009   better off diuretic  . INSOMNIA 05/31/2007  . Iron deficiency anemia    pt denies at 08/12/11 visit   . NSAID-induced duodenal ulcer   . OSTEOPOROSIS 01/21/2007  . Raynaud's syndrome 06/23/2007  . Recurrent upper respiratory infection (URI)    pt reports slight cold using Nyquil prn at bedtime   . VITAMIN B12 DEFICIENCY 10/02/2009  . Zoster 2012   twice - left groin/vagina    Past Surgical History:  Procedure Laterality Date  . COLONOSCOPY  06/18/2007   angulated and stenotic sigmoid colon with severe sigmoid diverticulosis  . EXPLORATORY LAPAROTOMY  08/03/2004   1) duodenal ulcer oversew, pyloroplasty, anterior, posterior and  truncal vagotomies  . EXPLORATORY LAPAROTOMY  10/20/2004   1) duodenal ulcer oversew  with exclusion2) side-side gastrojejunostomy 3) feeding jejunostomy  . LAPAROSCOPY  08/14/2011   Procedure: LAPAROSCOPY DIAGNOSTIC;  Surgeon: Kandis Cockingavid H Newman, MD;  Location: [facility];  Service: General;  Laterality: N/A;  . LAPAROTOMY  08/14/2011   Procedure: EXPLORATORY LAPAROTOMY;  Surgeon: Kandis Cockingavid H Newman, MD;  Location: [facility];  Service: General;  Laterality: N/A;  exploratory laparotomy, lysis of adhesions, revision gastrojejunostomy, upper endoscopy  . TUBAL LIGATION  1977  . UPPER GASTROINTESTINAL ENDOSCOPY  09/16/2007; 12/21/2008; 03/17/2011   2009: Normal 2010: Normal with enteroscopy2012: Gastrojejunal  anastomotic ulcer, gastric retention    Family History: Family History  Problem Relation Age of Onset  . Stroke Father     Social History:   reports that she has never smoked. She has never used smokeless tobacco. She reports that she drinks about 6.0 oz of alcohol per week . She reports that she does not use drugs.  Medications: No prescriptions prior to admission.    No results found for this or any previous visit (from the past 48 hour(s)).  No results found.   A comprehensive review of systems was negative.  Height 5\' 3"  (1.6 m), weight 46.7 kg (103 lb).  General appearance: alert, cooperative and appears stated age Head: Normocephalic, without obvious abnormality, atraumatic Neck: supple, symmetrical, trachea midline Resp: clear to auscultation bilaterally Cardio: regular rate and rhythm GI: non-tender Extremities: Intact sensation and capillary refill all digits.  +  epl/fpl/io.  No wounds.  Pulses: 2+ and symmetric Skin: Skin color, texture, turgor normal. No rashes or lesions Neurologic: Grossly normal Incision/Wound:none  Assessment/Plan Right distal radius fracture.  Recommend ORIF.  Non operative and operative treatment options were discussed with the patient and patient wishes  to proceed with operative treatment. Risks, benefits, and alternatives of surgery were discussed and the patient agrees with the plan of care.   [name] R 05/01/2016, 10:41 AM

## 2016-05-01 NOTE — Progress Notes (Signed)
Assisted Dr. Crews with right, ultrasound guided, infraclavicular block. Side rails up, monitors on throughout procedure. See vital signs in flow sheet. Tolerated Procedure well. 

## 2016-05-01 NOTE — Transfer of Care (Signed)
Immediate Anesthesia Transfer of Care Note  Patient: [name]  Procedure(s) Performed: Procedure(s): OPEN REDUCTION INTERNAL FIXATION (ORIF) DISTAL RADIAL FRACTURE, right (Right)  Patient Location: PACU  Anesthesia Type:General  Level of Consciousness: awake and sedated  Airway & Oxygen Therapy: Patient Spontanous Breathing and Patient connected to face mask oxygen  Post-op Assessment: Report given to RN and Post -op Vital signs reviewed and stable  Post vital signs: Reviewed and stable  Last Vitals:  Vitals:   05/01/16 1220 05/01/16 1225  BP:    Pulse: (!) 59 68  Resp: 14 13  Temp:      Last Pain:  Vitals:   05/01/16 1110  TempSrc: Oral  PainSc: 9          Complications: No apparent anesthesia complications

## 2016-05-01 NOTE — Anesthesia Procedure Notes (Signed)
Procedure Name: LMA Insertion Performed by: York GricePEARSON, Cassiel Fernandez W Pre-anesthesia Checklist: Patient identified, Emergency Drugs available, Suction available and Patient being monitored Patient Re-evaluated:Patient Re-evaluated prior to inductionOxygen Delivery Method: Circle system utilized Preoxygenation: Pre-oxygenation with 100% oxygen Intubation Type: IV induction Ventilation: Mask ventilation without difficulty LMA: LMA inserted LMA Size: 4.0 and 3.0 Number of attempts: 1 Placement Confirmation: positive ETCO2 Tube secured with: Tape Dental Injury: Teeth and Oropharynx as per pre-operative assessment

## 2016-05-01 NOTE — Discharge Instructions (Addendum)

## 2016-05-01 NOTE — Op Note (Signed)
I assisted Surgeon(s) and Role:    * Betha LoaKevin Shayna Eblen, MD - Primary    * Cindee SaltGary Mohid Furuya, MD - Assisting on the Procedure(s): OPEN REDUCTION INTERNAL FIXATION (ORIF) DISTAL RADIAL FRACTURE, right on 05/01/2016.  I provided assistance on this case as follows: approach, debridement and reduction of the fracture, stabilization and fixation of the fracture with plate and screws, closure of the wound, application of the dressings and splint. I was present for the entire case.  Electronically signed by: Nicki ReaperKUZMA,Nancylee Gaines R, MD Date: 05/01/2016 Time: 1:53 PM

## 2016-05-01 NOTE — Brief Op Note (Signed)
05/01/2016  1:53 PM  PATIENT:  [name]  65 y.o. female  PRE-OPERATIVE DIAGNOSIS:  Right distal radius fracture  POST-OPERATIVE DIAGNOSIS:  Right distal radius fracture  PROCEDURE:  Procedure(s): OPEN REDUCTION INTERNAL FIXATION (ORIF) DISTAL RADIAL FRACTURE, right (Right)  SURGEON:  Surgeon(s) and Role:    * Betha LoaKevin Paz Fuentes, MD - Primary    * Cindee SaltGary Takila Kronberg, MD - Assisting  PHYSICIAN ASSISTANT:   ASSISTANTS: Cindee SaltGary Raeanna Soberanes, MD   ANESTHESIA:   regional and general  EBL:  Total I/O In: 1000 [I.V.:1000] Out: 5 [Blood:5]  BLOOD ADMINISTERED:none  DRAINS: none   LOCAL MEDICATIONS USED:  NONE  SPECIMEN:  No Specimen  DISPOSITION OF SPECIMEN:  N/A  COUNTS:  YES  TOURNIQUET:   Total Tourniquet Time Documented: Upper Arm (Right) - 56 minutes Total: Upper Arm (Right) - 56 minutes   DICTATION: .Note written in EPIC  PLAN OF CARE: Discharge to home after PACU  PATIENT DISPOSITION:  PACU - hemodynamically stable.

## 2016-05-01 NOTE — Op Note (Signed)
05/01/2016 [facility]  Operative Note  Pre Op Diagnosis: Right comminuted intraarticular distal radius fracture  Post Op Diagnosis: Right comminuted intraarticular distal radius fracture  Procedure: ORIF Right comminuted intraarticular distal radius fracture 2 intraarticular fragments  Surgeon: Betha Loa, MD  Assistant: Cindee Salt, MD  Anesthesia: General and Regional  Fluids: Per anesthesia flow sheet  EBL: minimal  Complications: None  Specimen: None  Tourniquet Time:  Total Tourniquet Time Documented: Upper Arm (Right) - 56 minutes Total: Upper Arm (Right) - 56 minutes   Disposition: Stable to PACU  INDICATIONS:  [name] is a 65 y.o. female who states she fell from her recliner 1 week ago injuring her right wrist.  She was seen in the ED where XR revealed a distal radius fracture.  She was splinted and followed up in the office.  We discussed nonoperative and operative treatment options.  She wished to proceed with operative fixation.  Risks, benefits, and alternatives of surgery were discussed including the risk of blood loss; infection; damage to nerves, vessels, tendons, ligaments, bone; failure of surgery; need for additional surgery; complications with wound healing; continued pain; nonunion; malunion; stiffness.  We also discussed the possible need for bone graft and the benefits and risks including the possibility of disease transmission.  She voiced understanding of these risks and elected to proceed.   OPERATIVE COURSE:  After being identified preoperatively by myself, the patient and I agreed upon the procedure and site of procedure.  Surgical site was marked.  The risks, benefits and alternatives of the surgery were reviewed and she wished to proceed.  Surgical consent had been signed.  She was given IV vancomycin as preoperative antibiotic prophylaxis.  She was transferred to the operating room and placed on the operating room table in supine  position with the Right upper extremity on an armboard. General and Regional anesthesia was induced by the anesthesiologist.  The Right upper extremity was prepped and draped in normal sterile orthopedic fashion.  A surgical pause was performed between the surgeons, anesthesia and operating room staff, and all were in agreement as to the patient, procedure and site of procedure.  Tourniquet at the proximal aspect of the extremity was inflated to 250 mmHg after exsanguination of the limb with an Esmarch bandage.  Standard volar Sherilyn Cooter approach was used.  The bipolar electrocautery was used to obtain hemostasis.  The superficial and deep portions of the FCR tendon sheath were incised, and the FCR and FPL were swept ulnarly to protect the palmar cutaneous branch of the median nerve.  The brachioradialis was released at the radial side of the radius.  The pronator quadratus was released and elevated with the periosteal elevator.  The fracture site was identified and cleared of soft tissue interposition and hematoma.  It was reduced under direct visualization.  The periosteum was thick and had to be released posteriorly to allow reduction.   An AcuMed volar distal radial locking plate was selected.  It was secured to the bone with the guidepins.  C-arm was used in AP and lateral projections to ensure appropriate reduction and position of the hardware and adjustments made as necessary.  Standard AO drilling and measuring technique was used.  A single screw was placed in the slotted hole in the shaft of the plate.  The distal holes were filled with locking pegs with the exception of the styloid holes, which were filled with locking screws.  The remaining holes in the shaft of the  plate were filled with nonlocking screws.  Good purchase was obtained.  C-arm was used in AP, lateral and oblique projections to ensure appropriate reduction and position of hardware, which was the case.  There was no intra-articular penetration.   The wound was copiously irrigated with sterile saline.  Pronator quadratus was repaired back over top of the plate using 4-0 Vicryl suture.  Vicryl suture was placed in the subcutaneous tissues in an inverted interrupted fashion and the skin was closed with 4-0 nylon in a horizontal mattress fashion.  There was good pronation and supination of the wrist without crepitance.  The wound was then dressed with sterile Xeroform, 4x4s, and wrapped with a Kerlix bandage.  A volar splint was placed and wrapped with Kerlix and Ace bandage.  Tourniquet was deflated at 56 minutes.  Fingertips were pink with brisk capillary refill after deflation of the tourniquet.  Operative drapes were broken down.  The patient was awoken from anesthesia safely.  He was transferred back to the stretcher and taken to the PACU in stable condition.  I will see him back in the office in one week for postoperative followup.  I will give her a prescription for Percocet 5/325 1-2 tabs PO q6 hours prn pain, dispense #30.    Tami RibasKUZMA,Anisten Tomassi R, MD Electronically signed, 05/01/16

## 2016-05-02 ENCOUNTER — Encounter (HOSPITAL_BASED_OUTPATIENT_CLINIC_OR_DEPARTMENT_OTHER): Payer: Self-pay | Admitting: Orthopedic Surgery

## 2016-05-16 ENCOUNTER — Ambulatory Visit (INDEPENDENT_AMBULATORY_CARE_PROVIDER_SITE_OTHER): Payer: BLUE CROSS/BLUE SHIELD

## 2016-05-16 DIAGNOSIS — Z23 Encounter for immunization: Secondary | ICD-10-CM | POA: Diagnosis not present

## 2016-08-09 ENCOUNTER — Other Ambulatory Visit: Payer: Self-pay | Admitting: Internal Medicine

## 2016-10-30 ENCOUNTER — Ambulatory Visit: Payer: Self-pay | Admitting: Nurse Practitioner

## 2016-11-02 ENCOUNTER — Other Ambulatory Visit: Payer: Self-pay | Admitting: Internal Medicine

## 2016-11-03 NOTE — Telephone Encounter (Signed)
Okay to refill? 

## 2016-11-04 NOTE — Telephone Encounter (Signed)
Rx phoned in.   

## 2017-01-21 ENCOUNTER — Other Ambulatory Visit: Payer: Self-pay | Admitting: Internal Medicine

## 2017-01-22 NOTE — Telephone Encounter (Signed)
Last refill - 4.17.18  Last OV - 4.19.17  Okay to refill?

## 2017-01-23 NOTE — Telephone Encounter (Signed)
Okay for refill?  

## 2017-03-09 ENCOUNTER — Other Ambulatory Visit: Payer: Self-pay | Admitting: Internal Medicine

## 2017-04-24 ENCOUNTER — Other Ambulatory Visit: Payer: Self-pay | Admitting: Internal Medicine

## 2017-05-04 ENCOUNTER — Encounter: Payer: Self-pay | Admitting: Internal Medicine

## 2017-05-04 ENCOUNTER — Ambulatory Visit (INDEPENDENT_AMBULATORY_CARE_PROVIDER_SITE_OTHER): Payer: PPO | Admitting: Internal Medicine

## 2017-05-04 VITALS — BP 148/70 | HR 66 | Temp 98.0°F | Ht 63.0 in | Wt 87.4 lb

## 2017-05-04 DIAGNOSIS — I10 Essential (primary) hypertension: Secondary | ICD-10-CM

## 2017-05-04 DIAGNOSIS — F411 Generalized anxiety disorder: Secondary | ICD-10-CM | POA: Diagnosis not present

## 2017-05-04 DIAGNOSIS — E538 Deficiency of other specified B group vitamins: Secondary | ICD-10-CM | POA: Diagnosis not present

## 2017-05-04 DIAGNOSIS — Z79899 Other long term (current) drug therapy: Secondary | ICD-10-CM

## 2017-05-04 MED ORDER — ALPRAZOLAM 0.5 MG PO TABS: 0.5000 mg | ORAL_TABLET | Freq: Three times a day (TID) | ORAL | 0 refills | Status: DC | PRN

## 2017-05-04 NOTE — Patient Instructions (Addendum)
Take 400-600 mg of ibuprofen ( Advil, Motrin) with food every  6 hours as needed for pain relief or control of fever or muscle spasm  You  may move around, but avoid painful motions and activities.  Apply heat to the sore area for 15 to 20 minutes 3 or 4 times daily for the next two to 3 days.  Return in 6 months for an annual exam  Please check your blood pressure on a regular basis.  If it is consistently greater than 150/90, please make an office appointment.

## 2017-05-04 NOTE — Progress Notes (Signed)
Subjective:    Patient ID: Annabelle Harman, female    DOB: 1950-10-30, 66 y.o.   MRN: [id number]  HPI  Wt Readings from Last 3 Encounters:  05/04/17 87 lb 6.4 oz (39.6 kg)  05/01/16 93 lb (42.2 kg)  04/19/16 103 lb (46.28 kg)    66 year old married female, who is seen today after a 1 year absence. She has been on alprazolam and needed a medication refilled. She has a history of anxiety disorder.  Primary hypertension and also history of anemia.  The patient has no focal complaints, but seems very forgetful, tangential and frequently repeats the same questions.  She is unaccompanied and continues to drive.  Past Medical History:  Diagnosis Date  . Alcoholism (HCC) 02/02/2009  . ANXIETY 01/21/2007  . Blood transfusion    hx of 2005   . DIVERTICULOSIS 07/09/2007  . Duodenal ulcer hemorrhage perforated 2006  . Headache(784.0) 05/31/2007   pt denies at 08/12/11 preop visit   . HYPERTENSION 06/23/2007  . HYPONATREMIA 02/02/2009   better off diuretic  . INSOMNIA 05/31/2007  . Iron deficiency anemia    pt denies at 08/12/11 visit   . NSAID-induced duodenal ulcer   . OSTEOPOROSIS 01/21/2007  . Raynaud's syndrome 06/23/2007  . Recurrent upper respiratory infection (URI)    pt reports slight cold using Nyquil prn at bedtime   . VITAMIN B12 DEFICIENCY 10/02/2009  . Zoster 2012   twice - left groin/vagina     Social History   Social History  . Marital status: Married    Spouse name: N/A  . Number of children: 1  . Years of education: N/A   Occupational History  . FT student Unemployed   Social History Main Topics  . Smoking status: Never Smoker  . Smokeless tobacco: Never Used  . Alcohol use 6.0 oz/week    10 Cans of beer per week     Comment: 12 oz  . Drug use: No  . Sexual activity: Not on file   Other Topics Concern  . Not on file   Social History Narrative  . No narrative on file    Past Surgical History:  Procedure Laterality Date  . COLONOSCOPY  06/18/2007   angulated and stenotic sigmoid colon with severe sigmoid diverticulosis  . EXPLORATORY LAPAROTOMY  08/03/2004   1) duodenal ulcer oversew, pyloroplasty, anterior, posterior and truncal vagotomies  . EXPLORATORY LAPAROTOMY  10/20/2004   1) duodenal ulcer oversew  with exclusion2) side-side gastrojejunostomy 3) feeding jejunostomy  . LAPAROSCOPY  08/14/2011   Procedure: LAPAROSCOPY DIAGNOSTIC;  Surgeon: Kandis Cocking, MD;  Location: [facility];  Service: General;  Laterality: N/A;  . LAPAROTOMY  08/14/2011   Procedure: EXPLORATORY LAPAROTOMY;  Surgeon: Kandis Cocking, MD;  Location: [facility];  Service: General;  Laterality: N/A;  exploratory laparotomy, lysis of adhesions, revision gastrojejunostomy, upper endoscopy  . OPEN REDUCTION INTERNAL FIXATION (ORIF) DISTAL RADIAL FRACTURE Right 05/01/2016   Procedure: OPEN REDUCTION INTERNAL FIXATION (ORIF) DISTAL RADIAL FRACTURE, right;  Surgeon: Betha Loa, MD;  Location: [facility];  Service: Orthopedics;  Laterality: Right;  . TUBAL LIGATION  1977  . UPPER GASTROINTESTINAL ENDOSCOPY  09/16/2007; 12/21/2008; 03/17/2011   2009: Normal 2010: Normal with enteroscopy2012: Gastrojejunal  anastomotic ulcer, gastric retention    Family History  Problem Relation Age of Onset  . Stroke Father     Allergies  Allergen Reactions  . Sulfonamide Derivatives Anaphylaxis and Rash  . Penicillins Other (See Comments)  Has patient had a PCN reaction causing immediate rash, facial/tongue/throat swelling, SOB or lightheadedness with hypotension: No Has patient had a PCN reaction causing severe rash involving mucus membranes or skin necrosis: No Has patient had a PCN reaction that required hospitalization No Has patient had a PCN reaction occurring within the last 10 years: No If all of the above answers are "NO", then may proceed with Cephalosporin use.  Marland Kitchen Hydromorphone Hcl Rash    Current Outpatient Prescriptions on File Prior to Visit  Medication Sig  Dispense Refill  . acetaminophen (TYLENOL) 500 MG tablet Take 500 mg by mouth every 6 (six) hours as needed for mild pain.     . Ca Carbonate-Mag Hydroxide (ROLAIDS PO) Take 2 tablets by mouth daily as needed (indigestion).    Marland Kitchen lisinopril (PRINIVIL,ZESTRIL) 20 MG tablet take 1 tablet by mouth once daily WITH BREAKFAST 90 tablet 1   No current facility-administered medications on file prior to visit.     BP (!) 148/70 (BP Location: Left Arm, Patient Position: Sitting, Cuff Size: Normal)   Pulse 66   Temp 98 F (36.7 C) (Oral)   Ht  (1.6 m)   Wt 87 lb 6.4 oz (39.6 kg)   SpO2 97%   BMI 15.48 kg/m     Review of Systems  Constitutional: Positive for unexpected weight change.  HENT: Negative for congestion, dental problem, hearing loss, rhinorrhea, sinus pressure, sore throat and tinnitus.   Eyes: Negative for pain, discharge and visual disturbance.  Respiratory: Negative for cough and shortness of breath.   Cardiovascular: Negative for chest pain, palpitations and leg swelling.  Gastrointestinal: Negative for abdominal distention, abdominal pain, blood in stool, constipation, diarrhea, nausea and vomiting.  Genitourinary: Negative for difficulty urinating, dysuria, flank pain, frequency, hematuria, pelvic pain, urgency, vaginal bleeding, vaginal discharge and vaginal pain.  Musculoskeletal: Negative for arthralgias, gait problem and joint swelling.  Skin: Negative for rash.  Neurological: Negative for dizziness, syncope, speech difficulty, weakness, numbness and headaches.  Hematological: Negative for adenopathy.  Psychiatric/Behavioral: Positive for behavioral problems. Negative for agitation and dysphoric mood. The patient is nervous/anxious.        Objective:   Physical Exam  Constitutional: She is oriented to person, place, and time. She appears well-developed and well-nourished. No distress.  Blood pressure 140/70 Alert but very forgetful, frequently repeats the same  questions; tangential  undernourished  HENT:  Head: Normocephalic.  Right Ear: External ear normal.  Left Ear: External ear normal.  Mouth/Throat: Oropharynx is clear and moist.  Eyes: Pupils are equal, round, and reactive to light. Conjunctivae and EOM are normal.  Neck: Normal range of motion. Neck supple. No thyromegaly present.  Cardiovascular: Normal rate, regular rhythm, normal heart sounds and intact distal pulses.   Pulmonary/Chest: Effort normal and breath sounds normal.  Abdominal: Soft. Bowel sounds are normal. She exhibits no mass. There is no tenderness.  Musculoskeletal: Normal range of motion.  Lymphadenopathy:    She has no cervical adenopathy.  Neurological: She is alert and oriented to person, place, and time.  Skin: Skin is warm and dry. No rash noted.  Psychiatric: She has a normal mood and affect.  Appears to have significant cognitive impairment          Assessment & Plan:   Essential hypertension Anxiety disorder Malnourished Rule out cognitive impairment  The patient is scheduled for a complete examination to include MMSE Medications updated Flu vaccine administered  Rogelia Boga

## 2017-05-05 ENCOUNTER — Ambulatory Visit: Payer: BLUE CROSS/BLUE SHIELD

## 2017-05-05 ENCOUNTER — Telehealth: Payer: Self-pay | Admitting: Internal Medicine

## 2017-05-07 NOTE — Telephone Encounter (Signed)
error 

## 2017-05-08 ENCOUNTER — Telehealth: Payer: Self-pay | Admitting: Internal Medicine

## 2017-05-08 NOTE — Telephone Encounter (Signed)
[facility] [phone] [facility] [address]

## 2017-05-08 NOTE — Telephone Encounter (Signed)
° ° ° ° ° °  Pt call to ask if the below med was sent to the pharmacy. She saw the doctor on  Monday 05/04/17    ALPRAZolam (XANAX) 0.5 MG tablet

## 2017-05-12 MED ORDER — ALPRAZOLAM 0.5 MG PO TABS: 0.5000 mg | ORAL_TABLET | Freq: Three times a day (TID) | ORAL | 0 refills | Status: DC | PRN

## 2017-05-12 NOTE — Telephone Encounter (Signed)
Medication was called in to correct pharmacy.

## 2017-06-06 ENCOUNTER — Other Ambulatory Visit: Payer: Self-pay | Admitting: Internal Medicine

## 2017-06-23 ENCOUNTER — Other Ambulatory Visit: Payer: Self-pay | Admitting: Internal Medicine

## 2017-06-24 ENCOUNTER — Encounter: Payer: Self-pay | Admitting: Internal Medicine

## 2017-07-01 ENCOUNTER — Other Ambulatory Visit: Payer: Self-pay | Admitting: Internal Medicine

## 2017-08-12 ENCOUNTER — Other Ambulatory Visit: Payer: Self-pay | Admitting: Internal Medicine

## 2017-08-19 ENCOUNTER — Encounter: Payer: Self-pay | Admitting: Adult Health

## 2017-08-19 ENCOUNTER — Ambulatory Visit (INDEPENDENT_AMBULATORY_CARE_PROVIDER_SITE_OTHER): Payer: PPO | Admitting: Adult Health

## 2017-08-19 VITALS — BP 90/50 | Temp 97.6°F | Ht 63.0 in | Wt 93.1 lb

## 2017-08-19 DIAGNOSIS — R319 Hematuria, unspecified: Secondary | ICD-10-CM

## 2017-08-19 DIAGNOSIS — R4189 Other symptoms and signs involving cognitive functions and awareness: Secondary | ICD-10-CM | POA: Diagnosis not present

## 2017-08-19 LAB — POCT URINALYSIS DIPSTICK
BILIRUBIN UA: NEGATIVE
GLUCOSE UA: NEGATIVE
NITRITE UA: NEGATIVE
PROTEIN UA: NEGATIVE
Spec Grav, UA: 1.02 (ref 1.010–1.025)
Urobilinogen, UA: 0.2 E.U./dL
pH, UA: 6 (ref 5.0–8.0)

## 2017-08-19 NOTE — Progress Notes (Signed)
Subjective:    Patient ID: [name], female    DOB: [birth year], 67 y.o.   MRN: [id number]  HPI  67 year old female  Who  has a past medical history of Alcoholism (HCC) (02/02/2009), ANXIETY (01/21/2007), Blood transfusion, DIVERTICULOSIS (07/09/2007), Duodenal ulcer hemorrhage perforated (2006), Headache(784.0) (05/31/2007), HYPERTENSION (06/23/2007), HYPONATREMIA (02/02/2009), INSOMNIA (05/31/2007), Iron deficiency anemia, NSAID-induced duodenal ulcer, OSTEOPOROSIS (01/21/2007), Raynaud's syndrome (06/23/2007), Recurrent upper respiratory infection (URI), VITAMIN B12 DEFICIENCY (10/02/2009), and Zoster (2012).   She presents to the office today with her son who helps provide history  She is a patient of Dr. Kirtland Bouchard who I am seeing today for the complaint of confusion. Her son reports that over the last few months that her short term memory has been in a slow decline( has noticing worsening symptoms over the last 2-3 weeks). He reports that she will repeat herself often and ask the same questions over and over.  He also reports that she often misplaces items such as her glasses.  He denies misplacing keys, wallet or purse.  When asking the patient if she feels as though it is a confusion she first denies this but then states that she does feel confused on occasion.   There is no family history of dementia  Denies any UTI-like symptoms, fevers, chest pain, shortness of breath, abdominal pain, or feeling acutely ill.  She has not started any new medication  Per's PCP note from May 04, 2017 "patient has no focal complaints, but seems very forgetful, tangential and frequently repeats the same questions"   Review of Systems See HPI   Past Medical History:  Diagnosis Date  . Alcoholism (HCC) 02/02/2009  . ANXIETY 01/21/2007  . Blood transfusion    hx of 2005   . DIVERTICULOSIS 07/09/2007  . Duodenal ulcer hemorrhage perforated 2006  . Headache(784.0) 05/31/2007   pt denies at 08/12/11 preop visit    . HYPERTENSION 06/23/2007  . HYPONATREMIA 02/02/2009   better off diuretic  . INSOMNIA 05/31/2007  . Iron deficiency anemia    pt denies at 08/12/11 visit   . NSAID-induced duodenal ulcer   . OSTEOPOROSIS 01/21/2007  . Raynaud's syndrome 06/23/2007  . Recurrent upper respiratory infection (URI)    pt reports slight cold using Nyquil prn at bedtime   . VITAMIN B12 DEFICIENCY 10/02/2009  . Zoster 2012   twice - left groin/vagina    Social History   Socioeconomic History  . Marital status: Married    Spouse name: Not on file  . Number of children: 1  . Years of education: Not on file  . Highest education level: Not on file  Social Needs  . Financial resource strain: Not on file  . Food insecurity - worry: Not on file  . Food insecurity - inability: Not on file  . Transportation needs - medical: Not on file  . Transportation needs - non-medical: Not on file  Occupational History  . Occupation: FT Dentist: UNEMPLOYED  Tobacco Use  . Smoking status: Never Smoker  . Smokeless tobacco: Never Used  Substance and Sexual Activity  . Alcohol use: Yes    Alcohol/week: 6.0 oz    Types: 10 Cans of beer per week    Comment: 12 oz  . Drug use: No  . Sexual activity: Not on file  Other Topics Concern  . Not on file  Social History Narrative  . Not on file    Past Surgical History:  Procedure Laterality  Date  . COLONOSCOPY  06/18/2007   angulated and stenotic sigmoid colon with severe sigmoid diverticulosis  . EXPLORATORY LAPAROTOMY  08/03/2004   1) duodenal ulcer oversew, pyloroplasty, anterior, posterior and truncal vagotomies  . EXPLORATORY LAPAROTOMY  10/20/2004   1) duodenal ulcer oversew  with exclusion2) side-side gastrojejunostomy 3) feeding jejunostomy  . LAPAROSCOPY  08/14/2011   Procedure: LAPAROSCOPY DIAGNOSTIC;  Surgeon: Kandis Cocking, MD;  Location: [facility];  Service: General;  Laterality: N/A;  . LAPAROTOMY  08/14/2011   Procedure: EXPLORATORY LAPAROTOMY;   Surgeon: Kandis Cocking, MD;  Location: [facility];  Service: General;  Laterality: N/A;  exploratory laparotomy, lysis of adhesions, revision gastrojejunostomy, upper endoscopy  . OPEN REDUCTION INTERNAL FIXATION (ORIF) DISTAL RADIAL FRACTURE Right 05/01/2016   Procedure: OPEN REDUCTION INTERNAL FIXATION (ORIF) DISTAL RADIAL FRACTURE, right;  Surgeon: Betha Loa, MD;  [facility];  Service: Orthopedics;  Laterality: Right;  . TUBAL LIGATION  1977  . UPPER GASTROINTESTINAL ENDOSCOPY  09/16/2007; 12/21/2008; 03/17/2011   2009: Normal 2010: Normal with enteroscopy2012: Gastrojejunal  anastomotic ulcer, gastric retention    Family History  Problem Relation Age of Onset  . Stroke Father     Allergies  Allergen Reactions  . Sulfa Antibiotics Anaphylaxis and Rash  . Sulfonamide Derivatives Anaphylaxis and Rash  . Penicillins Other (See Comments)    Has patient had a PCN reaction causing immediate rash, facial/tongue/throat swelling, SOB or lightheadedness with hypotension: No Has patient had a PCN reaction causing severe rash involving mucus membranes or skin necrosis: No Has patient had a PCN reaction that required hospitalization No Has patient had a PCN reaction occurring within the last 10 years: No If all of the above answers are "NO", then may proceed with Cephalosporin use.  Marland Kitchen Hydromorphone Hcl Rash    Current Outpatient Medications on File Prior to Visit  Medication Sig Dispense Refill  . acetaminophen (TYLENOL) 500 MG tablet Take 500 mg by mouth every 6 (six) hours as needed for mild pain.     Marland Kitchen ALPRAZolam (XANAX) 0.5 MG tablet TAKE 1 TABLET BY MOUTH 3 TIMES A DAY AS NEEDED FOR ANXIETY 60 tablet 0  . Ca Carbonate-Mag Hydroxide (ROLAIDS PO) Take 2 tablets by mouth daily as needed (indigestion).    Marland Kitchen lisinopril (PRINIVIL,ZESTRIL) 20 MG tablet TAKE 1 TABLET BY MOUTH ONCE DAILY WITH BREAKFAST 90 tablet 1   No current facility-administered medications on file prior to  visit.     BP (!) 90/50 (BP Location: Left Arm, Patient Position: Sitting, Cuff Size: Normal)   Temp 97.6 F (36.4 C) (Oral)   Ht 5\' 3"  (1.6 m)   Wt 93 lb 1.6 oz (42.2 kg)   BMI 16.49 kg/m       Objective:   Physical Exam  Constitutional: She is oriented to person, place, and time. She appears well-developed and well-nourished. No distress.  Cardiovascular: Normal rate, regular rhythm, normal heart sounds and intact distal pulses. Exam reveals no gallop and no friction rub.  No murmur heard. Pulmonary/Chest: Effort normal and breath sounds normal. No respiratory distress. She has no wheezes. She has no rales. She exhibits no tenderness.  Abdominal: Soft. Normal appearance and bowel sounds are normal. She exhibits no distension and no mass. There is no hepatosplenomegaly, splenomegaly or hepatomegaly. There is no tenderness. There is no rebound, no guarding and no CVA tenderness.  Neurological: She is alert and oriented to person, place, and time.  Skin: Skin is warm and dry. No  rash noted. She is not diaphoretic. No erythema. No pallor.  Psychiatric: She has a normal mood and affect. Her speech is normal and behavior is normal. Judgment and thought content normal. Cognition and memory are impaired.  Nursing note and vitals reviewed.     Assessment & Plan:  1. Cognitive impairment  - Mini Mental Status Exam - 19/30.  He had issues with orientation to time and place, delayed verbal recall, writing, and copy.  Exam has been scanned into chart.  Will refer to neuropsych for more thorough testing.  And have her follow-up with PCP as needed - CBC with Differential/Platelet - Basic Metabolic Panel - POC Urinalysis Dipstick - Ferritin - Ambulatory referral to Neurology - TSH  2. Hematuria, unspecified type  - Urine Culture  Shirline Frees, NP

## 2017-08-19 NOTE — Patient Instructions (Signed)
It was great meeting you today   I am going to get some blood work today and then send you for further testing with Neuropsychiatry they will call you to schedule the appointment

## 2017-08-20 ENCOUNTER — Telehealth: Payer: Self-pay | Admitting: Internal Medicine

## 2017-08-20 ENCOUNTER — Other Ambulatory Visit: Payer: Self-pay

## 2017-08-20 ENCOUNTER — Inpatient Hospital Stay (HOSPITAL_COMMUNITY)
Admission: EM | Admit: 2017-08-20 | Discharge: 2017-08-24 | DRG: 640 | Disposition: A | Discharge status: Home Health | Payer: PPO | Attending: Nephrology | Admitting: Nephrology

## 2017-08-20 ENCOUNTER — Telehealth: Payer: Self-pay | Admitting: Family Medicine

## 2017-08-20 ENCOUNTER — Encounter (HOSPITAL_COMMUNITY): Payer: Self-pay | Admitting: Emergency Medicine

## 2017-08-20 DIAGNOSIS — Z880 Allergy status to penicillin: Secondary | ICD-10-CM

## 2017-08-20 DIAGNOSIS — Z881 Allergy status to other antibiotic agents status: Secondary | ICD-10-CM | POA: Diagnosis not present

## 2017-08-20 DIAGNOSIS — Z882 Allergy status to sulfonamides status: Secondary | ICD-10-CM

## 2017-08-20 DIAGNOSIS — F1099 Alcohol use, unspecified with unspecified alcohol-induced disorder: Secondary | ICD-10-CM | POA: Diagnosis not present

## 2017-08-20 DIAGNOSIS — Z903 Acquired absence of stomach [part of]: Secondary | ICD-10-CM

## 2017-08-20 DIAGNOSIS — Z560 Unemployment, unspecified: Secondary | ICD-10-CM | POA: Diagnosis not present

## 2017-08-20 DIAGNOSIS — D509 Iron deficiency anemia, unspecified: Secondary | ICD-10-CM | POA: Diagnosis not present

## 2017-08-20 DIAGNOSIS — F17200 Nicotine dependence, unspecified, uncomplicated: Secondary | ICD-10-CM

## 2017-08-20 DIAGNOSIS — E43 Unspecified severe protein-calorie malnutrition: Secondary | ICD-10-CM | POA: Diagnosis not present

## 2017-08-20 DIAGNOSIS — G4700 Insomnia, unspecified: Secondary | ICD-10-CM | POA: Diagnosis not present

## 2017-08-20 DIAGNOSIS — E440 Moderate protein-calorie malnutrition: Secondary | ICD-10-CM | POA: Diagnosis not present

## 2017-08-20 DIAGNOSIS — W1830XA Fall on same level, unspecified, initial encounter: Secondary | ICD-10-CM | POA: Diagnosis present

## 2017-08-20 DIAGNOSIS — R2681 Unsteadiness on feet: Secondary | ICD-10-CM | POA: Diagnosis present

## 2017-08-20 DIAGNOSIS — G9341 Metabolic encephalopathy: Secondary | ICD-10-CM | POA: Diagnosis present

## 2017-08-20 DIAGNOSIS — R64 Cachexia: Secondary | ICD-10-CM | POA: Diagnosis not present

## 2017-08-20 DIAGNOSIS — Z681 Body mass index (BMI) 19 or less, adult: Secondary | ICD-10-CM | POA: Diagnosis not present

## 2017-08-20 DIAGNOSIS — F411 Generalized anxiety disorder: Secondary | ICD-10-CM | POA: Diagnosis not present

## 2017-08-20 DIAGNOSIS — Z79899 Other long term (current) drug therapy: Secondary | ICD-10-CM

## 2017-08-20 DIAGNOSIS — I7300 Raynaud's syndrome without gangrene: Secondary | ICD-10-CM | POA: Diagnosis not present

## 2017-08-20 DIAGNOSIS — R451 Restlessness and agitation: Secondary | ICD-10-CM | POA: Diagnosis not present

## 2017-08-20 DIAGNOSIS — D649 Anemia, unspecified: Secondary | ICD-10-CM | POA: Diagnosis present

## 2017-08-20 DIAGNOSIS — E876 Hypokalemia: Secondary | ICD-10-CM | POA: Diagnosis not present

## 2017-08-20 DIAGNOSIS — E871 Hypo-osmolality and hyponatremia: Secondary | ICD-10-CM | POA: Diagnosis not present

## 2017-08-20 DIAGNOSIS — R296 Repeated falls: Secondary | ICD-10-CM | POA: Diagnosis present

## 2017-08-20 DIAGNOSIS — Z8711 Personal history of peptic ulcer disease: Secondary | ICD-10-CM | POA: Diagnosis not present

## 2017-08-20 DIAGNOSIS — F1020 Alcohol dependence, uncomplicated: Secondary | ICD-10-CM | POA: Diagnosis not present

## 2017-08-20 DIAGNOSIS — Z885 Allergy status to narcotic agent status: Secondary | ICD-10-CM | POA: Diagnosis not present

## 2017-08-20 DIAGNOSIS — Z008 Encounter for other general examination: Secondary | ICD-10-CM | POA: Diagnosis not present

## 2017-08-20 DIAGNOSIS — I491 Atrial premature depolarization: Secondary | ICD-10-CM | POA: Diagnosis not present

## 2017-08-20 DIAGNOSIS — I10 Essential (primary) hypertension: Secondary | ICD-10-CM | POA: Diagnosis not present

## 2017-08-20 DIAGNOSIS — M810 Age-related osteoporosis without current pathological fracture: Secondary | ICD-10-CM | POA: Diagnosis present

## 2017-08-20 LAB — URINE CULTURE
MICRO NUMBER: 90128035
SPECIMEN QUALITY:: ADEQUATE

## 2017-08-20 LAB — CBC WITH DIFFERENTIAL/PLATELET
BASOS ABS: 0 10*3/uL (ref 0.0–0.1)
Basophils Absolute: 0.1 10*3/uL (ref 0.0–0.1)
Basophils Relative: 0.7 % (ref 0.0–3.0)
Basophils Relative: 0 %
EOS PCT: 0.5 % (ref 0.0–5.0)
Eosinophils Absolute: 0 10*3/uL (ref 0.0–0.7)
Eosinophils Absolute: 0 10*3/uL (ref 0.0–0.7)
Eosinophils Relative: 0 %
HCT: 26.2 % — ABNORMAL LOW (ref 36.0–46.0)
HEMATOCRIT: 24.5 % — AB (ref 36.0–46.0)
HEMOGLOBIN: 7.9 g/dL — AB (ref 12.0–15.0)
LYMPHS ABS: 0.8 10*3/uL (ref 0.7–4.0)
LYMPHS PCT: 8 %
Lymphocytes Relative: 3.3 % — ABNORMAL LOW (ref 12.0–46.0)
Lymphs Abs: 0.3 10*3/uL — ABNORMAL LOW (ref 0.7–4.0)
MCH: 23 pg — AB (ref 26.0–34.0)
MCHC: 32.2 g/dL (ref 30.0–36.0)
MCHC: 32.2 g/dL (ref 30.0–36.0)
MCV: 73.3 fl — AB (ref 78.0–100.0)
MCV: 71.2 fL — AB (ref 78.0–100.0)
Monocytes Absolute: 0.7 10*3/uL (ref 0.1–1.0)
Monocytes Absolute: 1 10*3/uL (ref 0.1–1.0)
Monocytes Relative: 7.5 % (ref 3.0–12.0)
Monocytes Relative: 11 %
NEUTROS ABS: 7.4 10*3/uL (ref 1.7–7.7)
NEUTROS PCT: 81 %
Neutro Abs: 8 10*3/uL — ABNORMAL HIGH (ref 1.4–7.7)
Neutrophils Relative %: 88 % — ABNORMAL HIGH (ref 43.0–77.0)
Platelets: 490 10*3/uL — ABNORMAL HIGH (ref 150.0–400.0)
Platelets: 459 10*3/uL — ABNORMAL HIGH (ref 150–400)
RBC: 3.58 Mil/uL — AB (ref 3.87–5.11)
RBC: 3.44 MIL/uL — AB (ref 3.87–5.11)
RDW: 17 % — ABNORMAL HIGH (ref 11.5–15.5)
RDW: 15.6 % — ABNORMAL HIGH (ref 11.5–15.5)
WBC: 9.1 10*3/uL (ref 4.0–10.5)
WBC: 9.2 10*3/uL (ref 4.0–10.5)

## 2017-08-20 LAB — BASIC METABOLIC PANEL
ANION GAP: 12 (ref 5–15)
BUN: 11 mg/dL (ref 6–23)
BUN: 9 mg/dL (ref 6–20)
CALCIUM: 8.7 mg/dL (ref 8.4–10.5)
CALCIUM: 8.6 mg/dL — AB (ref 8.9–10.3)
CO2: 26 meq/L (ref 19–32)
CO2: 24 mmol/L (ref 22–32)
CREATININE: 0.54 mg/dL (ref 0.44–1.00)
Chloride: 82 mEq/L — ABNORMAL LOW (ref 96–112)
Chloride: 86 mmol/L — ABNORMAL LOW (ref 101–111)
Creatinine, Ser: 0.5 mg/dL (ref 0.40–1.20)
GFR calc Af Amer: >60 mL/min (ref >60)
GFR: 131.42 mL/min (ref >60.00)
GLUCOSE: 91 mg/dL (ref 70–99)
Glucose, Bld: 156 mg/dL — ABNORMAL HIGH (ref 65–99)
Potassium: 4.4 mEq/L (ref 3.5–5.1)
Potassium: 3.5 mmol/L (ref 3.5–5.1)
SODIUM: 118 meq/L — AB (ref 135–145)
Sodium: 122 mmol/L — ABNORMAL LOW (ref 135–145)

## 2017-08-20 LAB — COMPREHENSIVE METABOLIC PANEL
ALT: 22 U/L (ref 14–54)
AST: 35 U/L (ref 15–41)
Albumin: 3.7 g/dL (ref 3.5–5.0)
Alkaline Phosphatase: 104 U/L (ref 38–126)
Anion gap: 12 (ref 5–15)
BUN: 12 mg/dL (ref 6–20)
CHLORIDE: 87 mmol/L — AB (ref 101–111)
CO2: 23 mmol/L (ref 22–32)
Calcium: 8.7 mg/dL — ABNORMAL LOW (ref 8.9–10.3)
Creatinine, Ser: 0.49 mg/dL (ref 0.44–1.00)
Glucose, Bld: 107 mg/dL — ABNORMAL HIGH (ref 65–99)
Potassium: 4 mmol/L (ref 3.5–5.1)
Sodium: 122 mmol/L — ABNORMAL LOW (ref 135–145)
TOTAL PROTEIN: 6.2 g/dL — AB (ref 6.5–8.1)
Total Bilirubin: 0.5 mg/dL (ref 0.3–1.2)

## 2017-08-20 LAB — URINALYSIS, ROUTINE W REFLEX MICROSCOPIC
Bilirubin Urine: NEGATIVE
Glucose, UA: NEGATIVE mg/dL
HGB URINE DIPSTICK: NEGATIVE
Ketones, ur: 5 mg/dL — AB
Leukocytes, UA: NEGATIVE
NITRITE: NEGATIVE
PROTEIN: NEGATIVE mg/dL
SPECIFIC GRAVITY, URINE: 1.015 (ref 1.005–1.030)
pH: 6 (ref 5.0–8.0)

## 2017-08-20 LAB — TSH: TSH: 1.03 u[IU]/mL (ref 0.35–4.50)

## 2017-08-20 LAB — ABO/RH: ABO/RH(D): O POS

## 2017-08-20 LAB — POC OCCULT BLOOD, ED: FECAL OCCULT BLD: NEGATIVE

## 2017-08-20 LAB — TYPE AND SCREEN
ABO/RH(D): O POS
Antibody Screen: NEGATIVE

## 2017-08-20 LAB — NA AND K (SODIUM & POTASSIUM), RAND UR
Potassium Urine: 20 mmol/L
Sodium, Ur: <10 mmol/L

## 2017-08-20 LAB — OSMOLALITY, URINE: Osmolality, Ur: 480 mOsm/kg (ref 300–900)

## 2017-08-20 LAB — FERRITIN
Ferritin: 3.5 ng/mL — ABNORMAL LOW (ref 10.0–291.0)
Ferritin: 3 ng/mL — ABNORMAL LOW (ref 11–307)

## 2017-08-20 LAB — OSMOLALITY: OSMOLALITY: 261 mosm/kg — AB (ref 275–295)

## 2017-08-20 MED ORDER — ONDANSETRON HCL 4 MG/2ML IJ SOLN: 4.0000 mg | Freq: Four times a day (QID) | INTRAMUSCULAR | Status: DC | PRN

## 2017-08-20 MED ORDER — THIAMINE HCL 100 MG/ML IJ SOLN
100.0000 mg | Freq: Every day | INTRAMUSCULAR | Status: DC
  Administered 2017-08-22: 100 mg via INTRAVENOUS
  Filled 2017-08-20: qty 2

## 2017-08-20 MED ORDER — ENOXAPARIN SODIUM 40 MG/0.4ML ~~LOC~~ SOLN
40.0000 mg | SUBCUTANEOUS | Status: DC
  Administered 2017-08-20: 40 mg via SUBCUTANEOUS
  Filled 2017-08-20: qty 0.4

## 2017-08-20 MED ORDER — ADULT MULTIVITAMIN W/MINERALS CH
1.0000 | ORAL_TABLET | Freq: Every day | ORAL | Status: DC
  Administered 2017-08-20 – 2017-08-24 (×5): 1 via ORAL
  Filled 2017-08-20 (×5): qty 1

## 2017-08-20 MED ORDER — LORAZEPAM 2 MG/ML IJ SOLN: 1.0000 mg | Freq: Four times a day (QID) | INTRAMUSCULAR | Status: AC | PRN

## 2017-08-20 MED ORDER — LORAZEPAM 1 MG PO TABS
1.0000 mg | ORAL_TABLET | Freq: Four times a day (QID) | ORAL | Status: AC | PRN
  Administered 2017-08-20 – 2017-08-23 (×9): 1 mg via ORAL
  Filled 2017-08-20 (×9): qty 1

## 2017-08-20 MED ORDER — HYDRALAZINE HCL 20 MG/ML IJ SOLN: 5.0000 mg | INTRAMUSCULAR | Status: DC | PRN

## 2017-08-20 MED ORDER — ONDANSETRON HCL 4 MG PO TABS: 4.0000 mg | ORAL_TABLET | Freq: Four times a day (QID) | ORAL | Status: DC | PRN

## 2017-08-20 MED ORDER — ACETAMINOPHEN 325 MG PO TABS
650.0000 mg | ORAL_TABLET | Freq: Four times a day (QID) | ORAL | Status: DC | PRN
  Administered 2017-08-21 – 2017-08-24 (×4): 650 mg via ORAL
  Filled 2017-08-20 (×4): qty 2

## 2017-08-20 MED ORDER — ACETAMINOPHEN 500 MG PO TABS
500.0000 mg | ORAL_TABLET | Freq: Once | ORAL | Status: AC
Start: 2017-08-20 — End: 2017-08-20
  Administered 2017-08-20: 500 mg via ORAL
  Filled 2017-08-20: qty 1

## 2017-08-20 MED ORDER — FOLIC ACID 1 MG PO TABS
1.0000 mg | ORAL_TABLET | Freq: Every day | ORAL | Status: DC
  Administered 2017-08-20 – 2017-08-24 (×5): 1 mg via ORAL
  Filled 2017-08-20 (×5): qty 1

## 2017-08-20 MED ORDER — VITAMIN B-1 100 MG PO TABS
100.0000 mg | ORAL_TABLET | Freq: Every day | ORAL | Status: DC
  Administered 2017-08-20 – 2017-08-24 (×4): 100 mg via ORAL
  Filled 2017-08-20 (×5): qty 1

## 2017-08-20 MED ORDER — ACETAMINOPHEN 650 MG RE SUPP: 650.0000 mg | Freq: Four times a day (QID) | RECTAL | Status: DC | PRN

## 2017-08-20 MED ORDER — DOCUSATE SODIUM 100 MG PO CAPS
100.0000 mg | ORAL_CAPSULE | Freq: Two times a day (BID) | ORAL | Status: DC
  Administered 2017-08-20 – 2017-08-24 (×8): 100 mg via ORAL
  Filled 2017-08-20 (×8): qty 1

## 2017-08-20 NOTE — H&P (Signed)
History and Physical    [name] ZOX:[id number] DOB: June 23, 1951 DOA: 08/20/2017  PCP: Gordy Savers, MD Consultants:  None Patient coming from:  Home - lives with husband; NOK: Husband  Chief Complaint: sent in due to hyponatremia  HPI: [name] is a 67 y.o. female with medical history significant of hyponatremia, improved after cessation of diuretic; ETOH dependence, currently weaned down to 1 beer/day; and anxiety presenting after she was sent by her doctor due to low sodium.  She fell in the bathroom yesterday and hit her left forehead.  No other recent falls.  She denies confusion.  No one else is present to provide history.  HPI from PCP visit yesterday: Her son reports that over the last few months that her short term memory has been in a slow decline( has noticing worsening symptoms over the last 2-3 weeks). He reports that she will repeat herself often and ask the same questions over and over. He also reports that she often misplaces items such as her glasses. He denies misplacing keys, wallet or purse.  When asking the patient if she feels as though it is a confusion she first denies this but then states that she does feel confused on occasion.  There is no family history of dementia  Denies any UTI-like symptoms, fevers, chest pain, shortness of breath, abdominal pain, or feeling acutely ill.  She has not started any new medication  Per's PCP note from May 04, 2017 "patient has no focal complaints, but seems very forgetful, tangential and frequently repeats the same questions"    ED Course:  Patient with chronic hyponatremia.  3-4 weeks of confusion, unstable gait.  Na++ yesterday 118, now 122.  Hgb slightly lower than baseline (7.9), heme negative.  Appears euvolemic, chronically malnourished.  ETOH dependence but currently 1 beer/day.  Review of Systems: As per HPI; otherwise review of systems reviewed and negative. This is suspect.    Ambulatory Status:   Ambulates without assistance  Past Medical History:  Diagnosis Date  . Alcoholism (HCC) 02/02/2009  . ANXIETY 01/21/2007  . Blood transfusion    hx of 2005   . DIVERTICULOSIS 07/09/2007  . Duodenal ulcer hemorrhage perforated 2006  . Headache(784.0) 05/31/2007   pt denies at 08/12/11 preop visit   . HYPERTENSION 06/23/2007  . HYPONATREMIA 02/02/2009   better off diuretic  . INSOMNIA 05/31/2007  . Iron deficiency anemia    pt denies at 08/12/11 visit   . NSAID-induced duodenal ulcer   . OSTEOPOROSIS 01/21/2007  . Raynaud's syndrome 06/23/2007  . Recurrent upper respiratory infection (URI)    pt reports slight cold using Nyquil prn at bedtime   . VITAMIN B12 DEFICIENCY 10/02/2009  . Zoster 2012   twice - left groin/vagina    Past Surgical History:  Procedure Laterality Date  . COLONOSCOPY  06/18/2007   angulated and stenotic sigmoid colon with severe sigmoid diverticulosis  . EXPLORATORY LAPAROTOMY  08/03/2004   1) duodenal ulcer oversew, pyloroplasty, anterior, posterior and truncal vagotomies  . EXPLORATORY LAPAROTOMY  10/20/2004   1) duodenal ulcer oversew  with exclusion2) side-side gastrojejunostomy 3) feeding jejunostomy  . LAPAROSCOPY  08/14/2011   Procedure: LAPAROSCOPY DIAGNOSTIC;  Surgeon: Kandis Cocking, MD;  Location: [facility];  Service: General;  Laterality: N/A;  . LAPAROTOMY  08/14/2011   Procedure: EXPLORATORY LAPAROTOMY;  Surgeon: Kandis Cocking, MD;  Location: [facility];  Service: General;  Laterality: N/A;  exploratory laparotomy, lysis of adhesions, revision gastrojejunostomy, upper endoscopy  .  OPEN REDUCTION INTERNAL FIXATION (ORIF) DISTAL RADIAL FRACTURE Right 05/01/2016   Procedure: OPEN REDUCTION INTERNAL FIXATION (ORIF) DISTAL RADIAL FRACTURE, right;  Surgeon: Betha LoaKevin Kuzma, MD;  Location: [facility];  Service: Orthopedics;  Laterality: Right;  . TUBAL LIGATION  1977  . UPPER GASTROINTESTINAL ENDOSCOPY  09/16/2007; 12/21/2008; 03/17/2011   2009: Normal 2010:  Normal with enteroscopy2012: Gastrojejunal  anastomotic ulcer, gastric retention    Social History   Socioeconomic History  . Marital status: Married    Spouse name: Not on file  . Number of children: 1  . Years of education: Not on file  . Highest education level: Not on file  Social Needs  . Financial resource strain: Not on file  . Food insecurity - worry: Not on file  . Food insecurity - inability: Not on file  . Transportation needs - medical: Not on file  . Transportation needs - non-medical: Not on file  Occupational History  . Occupation: retired    Associate Professormployer: UNEMPLOYED  Tobacco Use  . Smoking status: Never Smoker  . Smokeless tobacco: Never Used  Substance and Sexual Activity  . Alcohol use: Yes    Alcohol/week: 4.2 oz    Types: 7 Cans of beer per week    Comment: 12 oz  . Drug use: No  . Sexual activity: Not on file  Other Topics Concern  . Not on file  Social History Narrative  . Not on file    Allergies  Allergen Reactions  . Sulfa Antibiotics Anaphylaxis and Rash  . Sulfonamide Derivatives Anaphylaxis and Rash  . Penicillins Other (See Comments)    Has patient had a PCN reaction causing immediate rash, facial/tongue/throat swelling, SOB or lightheadedness with hypotension: No Has patient had a PCN reaction causing severe rash involving mucus membranes or skin necrosis: No Has patient had a PCN reaction that required hospitalization No Has patient had a PCN reaction occurring within the last 10 years: No If all of the above answers are "NO", then may proceed with Cephalosporin use.  Marland Kitchen. Hydromorphone Hcl Rash    Family History  Problem Relation Age of Onset  . Stroke Father     Prior to Admission medications   Medication Sig Start Date End Date Taking? Authorizing Provider  acetaminophen (TYLENOL) 500 MG tablet Take 500 mg by mouth every 6 (six) hours as needed for mild pain.     [provider]  ALPRAZolam Prudy Feeler(XANAX) 0.5 MG tablet TAKE 1  TABLET BY MOUTH 3 TIMES A DAY AS NEEDED FOR ANXIETY 08/13/17   Gordy SaversKwiatkowski, Peter F, MD  Ca Carbonate-Mag Hydroxide (ROLAIDS PO) Take 2 tablets by mouth daily as needed (indigestion).    [provider]  lisinopril (PRINIVIL,ZESTRIL) 20 MG tablet TAKE 1 TABLET BY MOUTH ONCE DAILY WITH BREAKFAST 06/08/17   Gordy SaversKwiatkowski, Peter F, MD    Physical Exam: Vitals:   08/20/17 1415 08/20/17 1430 08/20/17 1445 08/20/17 1500  BP: 128/68 136/68 134/65 140/71  Pulse: 63 63 62 64  Resp: 15 13 12 19   Temp:      TempSrc:      SpO2: 100% 100% 100% 100%  Weight:      Height:         General:  Appears calm and comfortable and is NAD; she appears chronically ill Eyes:  PERRL, EOMI, normal lids, iris ENT:  grossly normal hearing, lips & tongue, mmm; poor dentition Neck:  no LAD, masses or thyromegaly Cardiovascular:  RRR, no m/r/g. No LE edema.  Respiratory:   CTA bilaterally with no wheezes/rales/rhonchi.  Normal respiratory effort. Abdomen:  soft, NT, ND, NABS Skin:  no rash or induration seen on limited exam Musculoskeletal:  grossly normal tone BUE/BLE, good ROM, no bony abnormality Psychiatric:  grossly normal mood and affect, speech perseverative and tangential Neurologic:  CN 2-12 grossly intact, moves all extremities in coordinated fashion, sensation intact    Radiological Exams on Admission: No results found.  EKG: Independently reviewed.  NSR with rate 65; nonspecific ST changes with no evidence of acute ischemia, NSCSLT   Labs on Admission: I have personally reviewed the available labs and imaging studies at the time of the admission.  Pertinent labs:   Na++ 122; 118 yesterday, 133 in 10/17 Chl 87 Glucose 107 Hgb 7.9; 8.4 yesterday; 9.2 in 3/16 WBC 9.2 Plt 459 UA yesterday: trace LE Heme negative TSH 1.03   Assessment/Plan Principal Problem:   Hyponatremia Active Problems:   Anxiety state   Alcoholism (HCC)   Essential hypertension   Anemia     Hyponatremia -Etiology appears to be euvolemic hyponatremia -This likely evolved as a result of excessive oral fluid intake and/or beer potomania (although patient is reportedly being slowly tapered off) -SIADH is also a consideration, as this can arise from ACEI and she previously had hyponatremia associated with diuretic use -Will check urinary and serum Osm as well as urinary sodium and potassium levels -Because the patient is only mildly symptomatic, it is unlikely that this extent of hyponatremia developed overnight; instead this is likely chronic in nature -Will treat with fluid restriction of 1.2L day -This should be adjusted based on UOP with goal fluid intake of 500 cc less per day than his total urinary output -Will stop lisinopril -Will monitor sodium q8h to attempt to avoid overcorrection (>47mEq/L/day) so as to avoid central pontine myelinolysis -If fluid restriction is unsuccessful, would need to consider vaptan therapy  ETOH dependence -Patient with chronic ETOH dependence -Has been undergoing a closely monitored taper, reportedly, and is down to 1 beer/day -She is at high risk for complications of withdrawal including seizures, DTs -Will observe -CIWA protocol  HTN -Hold ACE and consider discontinuation -Diuretics are also a poor option -Consider addition of beta blocker or CCB if BP is uptrending -Will order prn IV hydralazine  Anemia -MCV is <80 and so this is microcytic anemia -MCV is just over 70 indicating probable iron deficiency, elevated RDW -With ongoing question, will check ferritin; if <15, diagnostic of iron deficiency; if >100, very unlikely iron deficiency -If ferritin is non-diagnostic, will check transferrin saturation - the lower it is, the more likely this is iron deficiency anemia -Heme negative  Anxiety -Hold Xanax due to BZD for ETOH dependence  DVT prophylaxis: Lovenox Code Status:  Full Family Communication: None present  Disposition  Plan:  Home once clinically improved Consults called: None  Admission status: Admit - It is my clinical opinion that admission to INPATIENT is reasonable and necessary because of the expectation that this patient will require hospital care that crosses at least 2 midnights to treat this condition based on the medical complexity of the problems presented.  Given the aforementioned information, the predictability of an adverse outcome is felt to be significant.    Jonah Blue MD [facility]  If note is complete, please contact covering daytime or nighttime physician. [email or web address] Password [facility]  08/20/2017, 3:05 PM

## 2017-08-20 NOTE — ED Notes (Signed)
Attempted report 

## 2017-08-20 NOTE — ED Provider Notes (Signed)
[facility] EMERGENCY DEPARTMENT Provider Note   CSN: [id number] Arrival date & time: 08/20/17  1225     History   Chief Complaint Chief Complaint  Patient presents with  . low sodium  . sent from dr's office    HPI [name] is a 67 y.o. female with a past medical history of alcoholism, hypertension, NSAID induced duodenal ulcer in the past, who presents to ED for evaluation of progressively worsening confusion over the past 3-4 weeks, unsteadiness on feet.  She was sent from her doctor's office after lab work was done yesterday and was told that she had a critically low sodium of 118.  Her husband states that she has been having confusion in the sense that she will ask and answer multiple questions repeatedly at a time and suffering from multiple falls.  They have been weaning her off of alcohol for the past several months and states that for the past 3-4 months she has been down to 1-2 beers a day.  Her husband states that she has suffered from withdrawal symptoms about 10 years ago when she tried detoxing from alcohol but denies any tremors at this time.  She denies chest pain, abdominal pain, diarrhea, vomiting, hematemesis, hematochezia or melena. Her last ETOH intake was yesterday.  HPI  Past Medical History:  Diagnosis Date  . Alcoholism (HCC) 02/02/2009  . ANXIETY 01/21/2007  . Blood transfusion    hx of 2005   . DIVERTICULOSIS 07/09/2007  . Duodenal ulcer hemorrhage perforated 2006  . Headache(784.0) 05/31/2007   pt denies at 08/12/11 preop visit   . HYPERTENSION 06/23/2007  . HYPONATREMIA 02/02/2009   better off diuretic  . INSOMNIA 05/31/2007  . Iron deficiency anemia    pt denies at 08/12/11 visit   . NSAID-induced duodenal ulcer   . OSTEOPOROSIS 01/21/2007  . Raynaud's syndrome 06/23/2007  . Recurrent upper respiratory infection (URI)    pt reports slight cold using Nyquil prn at bedtime   . VITAMIN B12 DEFICIENCY 10/02/2009  . Zoster 2012   twice  - left groin/vagina    Patient Active Problem List   Diagnosis Date Noted  . Marginal ulcer, at gastrojejunostomy, resected 08/14/2011. 09/19/2011  . LUQ abdominal pain near former jejunostomy site 03/04/2011  . 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 SHINGLES 08/19/2010  . B12 deficiency 10/02/2009  . HYPONATREMIA 02/02/2009  . Iron deficiency anemia 07/09/2007  . DIVERTICULOSIS 07/09/2007  . Essential hypertension 06/23/2007  . 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 INSOMNIA 05/31/2007  . HEADACHE 05/31/2007  . Anxiety state 01/21/2007  . Alcoholism (HCC) 01/21/2007  . OSTEOPOROSIS 01/21/2007    Past Surgical History:  Procedure Laterality Date  . COLONOSCOPY  06/18/2007   angulated and stenotic sigmoid colon with severe sigmoid diverticulosis  . EXPLORATORY LAPAROTOMY  08/03/2004   1) duodenal ulcer oversew, pyloroplasty, anterior, posterior and truncal vagotomies  . EXPLORATORY LAPAROTOMY  10/20/2004   1) duodenal ulcer oversew  with exclusion2) side-side gastrojejunostomy 3) feeding jejunostomy  . LAPAROSCOPY  08/14/2011   Procedure: LAPAROSCOPY DIAGNOSTIC;  Surgeon: Kandis Cocking, MD;  Location: [facility];  Service: General;  Laterality: N/A;  . LAPAROTOMY  08/14/2011   Procedure: EXPLORATORY LAPAROTOMY;  Surgeon: Kandis Cocking, MD;  Location: [facility];  Service: General;  Laterality: N/A;  exploratory laparotomy, lysis of adhesions, revision gastrojejunostomy, upper endoscopy  . OPEN REDUCTION INTERNAL FIXATION (ORIF) DISTAL RADIAL FRACTURE Right 05/01/2016   Procedure: OPEN REDUCTION  INTERNAL FIXATION (ORIF) DISTAL RADIAL FRACTURE, right;  Surgeon: Betha LoaKevin Kuzma, MD;  Location:  S[facility];  Service: Orthopedics;  Laterality: Right;  . TUBAL LIGATION  1977  . UPPER GASTROINTESTINAL ENDOSCOPY  09/16/2007; 12/21/2008; 03/17/2011   2009: Normal 2010: Normal with enteroscopy2012: Gastrojejunal  anastomotic ulcer, gastric retention     OB History    No data available       Home Medications    Prior to Admission medications   Medication Sig Start Date End Date Taking? Authorizing Provider  acetaminophen (TYLENOL) 500 MG tablet Take 500 mg by mouth every 6 (six) hours as needed for mild pain.     [provider]  ALPRAZolam Prudy Feeler(XANAX) 0.5 MG tablet TAKE 1 TABLET BY MOUTH 3 TIMES A DAY AS NEEDED FOR ANXIETY 08/13/17   Gordy SaversKwiatkowski, Peter F, MD  Ca Carbonate-Mag Hydroxide (ROLAIDS PO) Take 2 tablets by mouth daily as needed (indigestion).    [provider]  lisinopril (PRINIVIL,ZESTRIL) 20 MG tablet TAKE 1 TABLET BY MOUTH ONCE DAILY WITH BREAKFAST 06/08/17   Gordy SaversKwiatkowski, Peter F, MD    Family History Family History  Problem Relation Age of Onset  . Stroke Father     Social History Social History   Tobacco Use  . Smoking status: Never Smoker  . Smokeless tobacco: Never Used  Substance Use Topics  . Alcohol use: Yes    Alcohol/week: 6.0 oz    Types: 10 Cans of beer per week    Comment: 12 oz  . Drug use: No     Allergies   Sulfa antibiotics; Sulfonamide derivatives; Penicillins; and Hydromorphone hcl   Review of Systems Review of Systems  Constitutional: Negative for appetite change, chills and fever.  HENT: Negative for ear pain, rhinorrhea, sneezing and sore throat.   Eyes: Negative for photophobia and visual disturbance.  Respiratory: Negative for cough, chest tightness, shortness of breath and wheezing.   Cardiovascular: Negative for chest pain and palpitations.  Gastrointestinal: Negative for abdominal pain, blood in stool, constipation, diarrhea, nausea and vomiting.  Genitourinary: Negative for dysuria, hematuria and urgency.  Musculoskeletal: Negative for myalgias.  Skin: Negative for rash.  Neurological: Positive for light-headedness. Negative for dizziness and weakness.  Psychiatric/Behavioral: Positive for confusion.     Physical Exam Updated Vital Signs BP 137/62    Pulse 61   Temp (!) 97.2 F (36.2 C) (Oral)   Resp 12   Ht 5\' 3"  (1.6 m)   Wt 42.2 kg (93 lb 1.6 oz)   SpO2 100%   BMI 16.49 kg/m   Physical Exam  Constitutional: She is oriented to person, place, and time. She appears well-developed and well-nourished. No distress.  Nontoxic appearing and in no acute distress. Moist mucus membranes, does not appear fluid overloaded or dehydrated. Appears chronically malnourished.  HENT:  Head: Normocephalic and atraumatic.  Nose: Nose normal.  Eyes: Conjunctivae and EOM are normal. Left eye exhibits no discharge. No scleral icterus.  Neck: Normal range of motion. Neck supple.  Cardiovascular: Normal rate, regular rhythm, normal heart sounds and intact distal pulses. Exam reveals no gallop and no friction rub.  No murmur heard. Pulmonary/Chest: Effort normal and breath sounds normal. No respiratory distress.  Abdominal: Soft. Bowel sounds are normal. She exhibits no distension. There is no tenderness. There is no guarding.  Genitourinary: Rectum normal. Rectal exam shows no external hemorrhoid, no internal hemorrhoid, no fissure and anal tone normal.  Genitourinary Comments: No anal fissues, hemorrhoids noted. Stool is not grossly melanotic.  Musculoskeletal: Normal range of motion. She exhibits no edema.  Neurological: She is alert and oriented to person, place, and time. No cranial nerve deficit or sensory deficit. She exhibits normal muscle tone. Coordination normal.  Alert and oriented to self, place, time and current events.  Tells me that it is currently January 19 (year), able to identify family members in room. Pupils reactive. No facial asymmetry noted. Cranial nerves appear grossly intact. Sensation intact to light touch on face, BUE and BLE. Strength 5/5 in BUE and BLE.  Skin: Skin is warm and dry. No rash noted.  Psychiatric: She has a normal mood and affect.  Nursing note and vitals reviewed.    ED Treatments / Results  Labs (all  labs ordered are listed, but only abnormal results are displayed) Labs Reviewed  COMPREHENSIVE METABOLIC PANEL - Abnormal; Notable for the following components:      Result Value   Sodium 122 (*)    Chloride 87 (*)    Glucose, Bld 107 (*)    Calcium 8.7 (*)    Total Protein 6.2 (*)    All other components within normal limits  CBC WITH DIFFERENTIAL/PLATELET - Abnormal; Notable for the following components:   RBC 3.44 (*)    Hemoglobin 7.9 (*)    HCT 24.5 (*)    MCV 71.2 (*)    MCH 23.0 (*)    RDW 15.6 (*)    Platelets 459 (*)    All other components within normal limits  URINALYSIS, ROUTINE W REFLEX MICROSCOPIC  SODIUM, URINE, RANDOM  POC OCCULT BLOOD, ED  TYPE AND SCREEN  ABO/RH    EKG  EKG Interpretation  Date/Time:  Thursday August 20 2017 12:39:06 EST Ventricular Rate:  65 PR Interval:  176 QRS Duration: 92 QT Interval:  414 QTC Calculation: 430 R Axis:   84 Text Interpretation:  Sinus rhythm with Premature atrial complexes Minimal voltage criteria for LVH, may be normal variant Nonspecific ST abnormality Abnormal ECG no significant change since Oct 2017 Confirmed by Pricilla Loveless [phone]) on 08/20/2017 1:23:11 PM       Radiology No results found.  Procedures Procedures (including critical care time)  CRITICAL CARE Performed by: Dietrich Pates   Total critical care time: 35 minutes  Critical care time was exclusive of separately billable procedures and treating other patients.  Critical care was necessary to treat or prevent imminent or life-threatening deterioration.  Critical care was time spent personally by me on the following activities: development of treatment plan with patient and/or surrogate as well as nursing, discussions with consultants, evaluation of patient's response to treatment, examination of patient, obtaining history from patient or surrogate, ordering and performing treatments and interventions, ordering and review of laboratory  studies, ordering and review of radiographic studies, pulse oximetry and re-evaluation of patient's condition.   Medications Ordered in ED Medications - No data to display   Initial Impression / Assessment and Plan / ED Course  I have reviewed the triage vital signs and the nursing notes.  Pertinent labs & imaging results that were available during my care of the patient were reviewed by me and considered in my medical decision making (see chart for details).     Patient presents to ED for evaluation of progressive confusion over the past 3-4 weeks.  She was seen by PCP yesterday who did lab work and was told to come to the ED when critical result of hyponatremia at 118 worse done.  Today sodium level is 122.  Her hemoglobin is also low at 7.9 which is slightly lower than her baseline which appears to be at 8 through 9.  Hemoccult is negative.  She does not appear overtly dehydrated or fluid overloaded but she does appear chronically malnourished.  No alcohol withdrawal symptoms noted.  Will admit to hospitalist for further workup of her hyponatremia.  Portions of this note were generated with Scientist, clinical (histocompatibility and immunogenetics). Dictation errors may occur despite best attempts at proofreading.   Final Clinical Impressions(s) / ED Diagnoses   Final diagnoses:  Hyponatremia    ED Discharge Orders    None       Dietrich Pates, PA-C 08/20/17 1439    Pricilla Loveless, MD 08/21/17 [phone]

## 2017-08-20 NOTE — Telephone Encounter (Unsigned)
Copied from CRM [phone]#[id number]. Topic: Referral - Question >> Aug 20, 2017  9:15 AM Raquel SarnaHayes, Teresa G wrote: [facility] able to [facility] her until June 2019.  Pt's husband said that is too long of a wait and he didn't make the appt.  Please call husband back to discuss asap.

## 2017-08-20 NOTE — Telephone Encounter (Signed)
Lab called with critical results sodium is 118.3, [facility] for [facility].

## 2017-08-20 NOTE — ED Triage Notes (Addendum)
Sent from dr's office for low sodium level-- pt unsure of why she is here, husband with pt.  Pt is alert/oriented x 4,  Pt admits to drinking ETOH yesterday, none today. Hx of ETOH withdrawal in past-- husband states that she has "cut way back-- only drinks one a day"  Pt being seen at [facility] for cognitive impairment, unsteadiness on her feet-- increasing in past 2 weeks.

## 2017-08-20 NOTE — Telephone Encounter (Signed)
LM for patient husband to return call Calling to make aware that Dr Kirtland BouchardK is not in the office for the next 2 weeks.

## 2017-08-20 NOTE — Progress Notes (Signed)
Pt new admit from ED Dx Hyponatremia, alert but confused, room air, saline lock, need 1x assistance, on bed alarm for safety.

## 2017-08-20 NOTE — Telephone Encounter (Signed)
Pts husband was calling to speak with Shirline Freesory Nafziger regarding this referral. Kandee KeenCory was the one that put the referral in for pt to see neurology. But [facility] does not have any appts until June. Please advise.

## 2017-08-20 NOTE — Telephone Encounter (Signed)
Patient needs to go to the ER

## 2017-08-20 NOTE — ED Notes (Signed)
Report given to accepting nurse on 6N07.

## 2017-08-20 NOTE — Telephone Encounter (Signed)
[facility]17 [facility] --[facility] cannot be seen until June 2019.  Pt husband is wanting to know if Kandee KeenCory has any recommendations or idea of another office she can be referred to > [facility]

## 2017-08-20 NOTE — ED Notes (Signed)
Pt complaining of back pain and headache, requesting pain medication. Provider notified.

## 2017-08-20 NOTE — Telephone Encounter (Signed)
Patient husband was notified to take Patient to the ER.

## 2017-08-21 ENCOUNTER — Inpatient Hospital Stay (HOSPITAL_COMMUNITY): Payer: PPO

## 2017-08-21 DIAGNOSIS — D509 Iron deficiency anemia, unspecified: Secondary | ICD-10-CM

## 2017-08-21 DIAGNOSIS — I10 Essential (primary) hypertension: Secondary | ICD-10-CM

## 2017-08-21 DIAGNOSIS — F1020 Alcohol dependence, uncomplicated: Secondary | ICD-10-CM

## 2017-08-21 LAB — BASIC METABOLIC PANEL
ANION GAP: 9 (ref 5–15)
Anion gap: 10 (ref 5–15)
Anion gap: 13 (ref 5–15)
BUN: 6 mg/dL (ref 6–20)
BUN: 6 mg/dL (ref 6–20)
BUN: 11 mg/dL (ref 6–20)
CHLORIDE: 87 mmol/L — AB (ref 101–111)
CHLORIDE: 92 mmol/L — AB (ref 101–111)
CO2: 24 mmol/L (ref 22–32)
CO2: 24 mmol/L (ref 22–32)
CO2: 21 mmol/L — AB (ref 22–32)
Calcium: 8.7 mg/dL — ABNORMAL LOW (ref 8.9–10.3)
Calcium: 8.2 mg/dL — ABNORMAL LOW (ref 8.9–10.3)
Calcium: 8.1 mg/dL — ABNORMAL LOW (ref 8.9–10.3)
Chloride: 86 mmol/L — ABNORMAL LOW (ref 101–111)
Creatinine, Ser: 0.42 mg/dL — ABNORMAL LOW (ref 0.44–1.00)
Creatinine, Ser: 0.39 mg/dL — ABNORMAL LOW (ref 0.44–1.00)
Creatinine, Ser: 0.53 mg/dL (ref 0.44–1.00)
GFR calc Af Amer: >60 mL/min (ref >60)
GFR calc non Af Amer: >60 mL/min (ref >60)
GFR calc non Af Amer: >60 mL/min (ref >60)
GFR calc non Af Amer: >60 mL/min (ref >60)
GLUCOSE: 122 mg/dL — AB (ref 65–99)
Glucose, Bld: 98 mg/dL (ref 65–99)
Glucose, Bld: 54 mg/dL — ABNORMAL LOW (ref 65–99)
POTASSIUM: 3.5 mmol/L (ref 3.5–5.1)
POTASSIUM: 3.9 mmol/L (ref 3.5–5.1)
Potassium: 4 mmol/L (ref 3.5–5.1)
Sodium: 119 mmol/L — CL (ref 135–145)
Sodium: 121 mmol/L — ABNORMAL LOW (ref 135–145)
Sodium: 126 mmol/L — ABNORMAL LOW (ref 135–145)

## 2017-08-21 LAB — CBC
HEMATOCRIT: 24.9 % — AB (ref 36.0–46.0)
HEMOGLOBIN: 8 g/dL — AB (ref 12.0–15.0)
MCH: 22.9 pg — AB (ref 26.0–34.0)
MCHC: 32.1 g/dL (ref 30.0–36.0)
MCV: 71.3 fL — ABNORMAL LOW (ref 78.0–100.0)
Platelets: 431 10*3/uL — ABNORMAL HIGH (ref 150–400)
RBC: 3.49 MIL/uL — ABNORMAL LOW (ref 3.87–5.11)
RDW: 15.6 % — ABNORMAL HIGH (ref 11.5–15.5)
WBC: 7.5 10*3/uL (ref 4.0–10.5)

## 2017-08-21 LAB — GLUCOSE, CAPILLARY: Glucose-Capillary: 108 mg/dL — ABNORMAL HIGH (ref 65–99)

## 2017-08-21 LAB — HIV ANTIBODY (ROUTINE TESTING W REFLEX): HIV Screen 4th Generation wRfx: NONREACTIVE

## 2017-08-21 MED ORDER — ENOXAPARIN SODIUM 30 MG/0.3ML ~~LOC~~ SOLN
30.0000 mg | SUBCUTANEOUS | Status: DC
  Administered 2017-08-21 – 2017-08-23 (×3): 30 mg via SUBCUTANEOUS
  Filled 2017-08-21 (×3): qty 0.3

## 2017-08-21 MED ORDER — HYDRALAZINE HCL 20 MG/ML IJ SOLN: 5.0000 mg | INTRAMUSCULAR | Status: DC | PRN

## 2017-08-21 MED ORDER — FERROUS SULFATE 325 (65 FE) MG PO TABS
325.0000 mg | ORAL_TABLET | Freq: Two times a day (BID) | ORAL | Status: DC
  Administered 2017-08-21 – 2017-08-24 (×6): 325 mg via ORAL
  Filled 2017-08-21 (×6): qty 1

## 2017-08-21 MED ORDER — SODIUM CHLORIDE 1 G PO TABS
2.0000 g | ORAL_TABLET | Freq: Three times a day (TID) | ORAL | Status: DC
  Administered 2017-08-21 – 2017-08-24 (×10): 2 g via ORAL
  Filled 2017-08-21 (×12): qty 2

## 2017-08-21 NOTE — Progress Notes (Signed)
PROGRESS NOTE   Kirsten HarmanDiane M Rivera  ZOX:[id number]    DOB: Sep 18, 1950    DOA: 08/20/2017  PCP: Gordy SaversKwiatkowski, Peter F, MD   I have briefly reviewed patients [facility].  Brief Narrative:  67 year old female with PMH of hyponatremia, alcohol dependence, currently weaned down to 1 beer per day, anxiety, HTN, NSAID induced duodenal ulcer, anemia was sent by her PCP due to fall at home and hitting her forehead on day prior to admission and low sodium.  Admitted for hyponatremia.  Nephrology consulted 2/1.   Assessment & Plan:   Principal Problem:   Hyponatremia Active Problems:   Anxiety state   Alcoholism (HCC)   Essential hypertension   Anemia   1. Hyponatremia: Likely subacute on chronic.  Mental status likely not due to this.  No recent serum sodium since October 2017 when 133.  Since 1/30 sodium has ranged: 118 > 122 (admission) >122 > 119 > 121.  Clinically euvolemic.  Serum osmolarity 261, urine osmolarity 480, urine sodium <10.  TSH normal.  DD: Beer potomania (although reportedly has cut back on beers), SIADH (multiple possibilities).  Lisinopril discontinued.  Fluid restriction 1.2 L/day.  Nephrology consulted >have started salt tablets, every 12 BMPs, recheck urine sodium and osmolarity 2/2.   2. Alcohol dependence: As per report, beer intake being tapered as outpatient to 1 beer per day.  Remains at risk for withdrawal complications. CIWA protocol. 3. Essential hypertension: Controlled.  Lisinopril discontinued.  Continue as needed IV hydralazine for now. 4. Iron deficiency anemia: Ferritin 3.  FOBT negative.  Patient has had multiple EGDs up to 2012.  Prior history of partial gastrectomy and [name] may be contributing to iron malabsorption.  Hemoglobin stable.  Not on iron supplements as outpatient, started ferrous sulfate.  Consider outpatient GI follow-up for screening colonoscopy and/or repeat EGD.  Follow CBCs and transfuse if hemoglobin less than  7 g per DL. 5. Anxiety: Stable. 6. Confusion: Apparently has been going on for couple of months with decline in the last 2-3 weeks.  No focal deficits.?  Dementia.  TSH normal. 7. Chest x-ray finding: Chest x-ray shows fullness in the right hilum, CT chest recommended-can possibly be pursued non urgently as outpatient.    DVT prophylaxis: Lovenox Code Status: Full Family Communication: None at bedside Disposition: DC home pending medical improvement.   Consultants:  Nephrology  Procedures:  None  Antimicrobials:  None   Subjective: Alert and oriented x3 this morning.  Wants to know why she is in the hospital and if she can go home.  Denies complaints.  No pain.  As per RN, keeps repeating the same things intermittently.  ROS: As above.  Objective:  Vitals:   08/20/17 1824 08/20/17 1909 08/21/17 0515 08/21/17 1346  BP: 136/72 (!) 142/63 (!) 138/53 129/64  Pulse: (!) 50 66 (!) 59 78  Resp: 13 15 16    Temp:  97.8 F (36.6 C) 98.5 F (36.9 C) (!) 97.5 F (36.4 C)  TempSrc:  Oral Oral Oral  SpO2: 100% 100% 99% 100%  Weight:  42.2 kg (93 lb 1.6 oz)    Height:  5\' 3"  (1.6 m)      Examination:  General exam: Pleasant middle-aged female, moderately built, frail and chronically ill looking, sitting up comfortably in chair this morning. Respiratory system: Clear to auscultation. Respiratory effort normal. Cardiovascular system: S1 & S2 heard, RRR. No JVD, murmurs, rubs, gallops or clicks. No pedal edema. Gastrointestinal system: Abdomen  is nondistended, soft and nontender. No organomegaly or masses felt. Normal bowel sounds heard. Central nervous system: Alert and oriented x3. No focal neurological deficits. Extremities: Symmetric 5 x 5 power. Skin: No rashes, lesions or ulcers Psychiatry: Judgement and insight appear normal. Mood & affect appropriate.     Data Reviewed: I have personally reviewed following labs and imaging studies  CBC: Recent Labs  Lab  08/19/17 1552 08/20/17 1243 08/21/17 0318  WBC 9.1 9.2 7.5  NEUTROABS 8.0* 7.4  --   HGB 8.4 Repeated and verified X2.* 7.9* 8.0*  HCT 26.2* 24.5* 24.9*  MCV 73.3* 71.2* 71.3*  PLT 490.0* 459* 431*   Basic Metabolic Panel: Recent Labs  Lab 08/19/17 1552 08/20/17 1243 08/20/17 1933 08/21/17 0318 08/21/17 1153  NA 118* 122* 122* 119* 121*  K 4.4 4.0 3.5 3.5 4.0  CL 82* 87* 86* 86* 87*  CO2 26 23 24 24 24   GLUCOSE 91 107* 156* 122* 98  BUN 11 12 9 6 6   CREATININE 0.50 0.49 0.54 0.42* 0.39*  CALCIUM 8.7 8.7* 8.6* 8.7* 8.2*   Liver Function Tests: Recent Labs  Lab 08/20/17 1243  AST 35  ALT 22  ALKPHOS 104  BILITOT 0.5  PROT 6.2*  ALBUMIN 3.7     Recent Results (from the past 240 hour(s))  Urine Culture     Status: None   Collection Time: 08/19/17  4:12 PM  Result Value Ref Range Status   MICRO NUMBER: [id number]  Final   SPECIMEN QUALITY: ADEQUATE  Final   Sample Source URINE  Final   STATUS: FINAL  Final   ISOLATE 1:   Final    Single organism less than 10,000 CFU/mL isolated. These organisms, commonly found on external and internal genitalia, are considered colonizers. No further testing performed.         Radiology Studies: Dg Chest 2 View  Result Date: 08/21/2017 CLINICAL DATA:  Hyponatremia and history of tobacco use EXAM: CHEST  2 VIEW COMPARISON:  11/07/2015 FINDINGS: Cardiac shadow is mildly accentuated by the frontal technique. The lungs are well aerated bilaterally. Very mild interstitial changes are seen. Some fullness is noted in the right hilum. This is not as well appreciated on the lateral projection. No focal infiltrate or effusion is noted. No bony abnormality is seen. IMPRESSION: Fullness in the right hilum is noted. CT of the chest with contrast is recommended for further evaluation. Electronically Signed   By: Alcide Clever M.D.   On: 08/21/2017 13:14        Scheduled Meds: . docusate sodium  100 mg Oral BID  . enoxaparin (LOVENOX)  injection  30 mg Subcutaneous Q24H  . folic acid  1 mg Oral Daily  . multivitamin with minerals  1 tablet Oral Daily  . sodium chloride  2 g Oral TID WC  . thiamine  100 mg Oral Daily   Or  . thiamine  100 mg Intravenous Daily   Continuous Infusions:   LOS: 1 day     Marcellus Scott, MD, FACP, [facility]. [facility] Pager [phone]  If 7PM-7AM, please contact night-coverage [email or web address] Password [email or web address] 08/21/2017, 4:21 PM

## 2017-08-21 NOTE — Telephone Encounter (Signed)
Husband was called and made aware.

## 2017-08-21 NOTE — Telephone Encounter (Signed)
Patient is currently admitted

## 2017-08-21 NOTE — Progress Notes (Signed)
CRITICAL VALUE ALERT  Critical Value:  Sodium=119  Date & Time Notied:  08/21/2017 0518 & 0620  Provider Notified: B[place]heimer  Orders Received/Actions taken: No response

## 2017-08-21 NOTE — Consult Note (Signed)
I was asked by Dr. Algis Liming to see this 67 year old female for hyponatremia. She has a history of hyponatremia in the past to 120mq/l in 08/2011.  She has known hyponatremia while taking a thiazide in the past with improvement off.  But, also has an extensive history of beer consumption now improved. Husband is concerned about OCD behavior in eating.  He drinks 2 Sodas daily and 2 bottles of Crystal Drinks w/ MVI, a 12 ounce beer, eats meals daily currently on a chicken tender kick and will eat them all day long.  She has been followed by her PCP for worsening hyponatremia.  Sodium 08/19/17 was 118 and she was admitted.  Husband reports she has had some increased irritability and confusion.     Past Medical History:  Diagnosis Date  . Alcoholism ([place] 02/02/2009  . ANXIETY 01/21/2007  . Blood transfusion    hx of 2005   . DIVERTICULOSIS 07/09/2007  . Duodenal ulcer hemorrhage perforated 2006  . Headache(784.0) 05/31/2007   pt denies at 08/12/11 preop visit   . HYPERTENSION 06/23/2007  . HYPONATREMIA 02/02/2009   better off diuretic  . INSOMNIA 05/31/2007  . Iron deficiency anemia    pt denies at 08/12/11 visit   . NSAID-induced duodenal ulcer   . OSTEOPOROSIS 01/21/2007  . Raynaud's syndrome 06/23/2007  . Recurrent upper respiratory infection (URI)    pt reports slight cold using Nyquil prn at bedtime   . VITAMIN B12 DEFICIENCY 10/02/2009  . Zoster 2012   twice - left groin/vagina   Past Surgical History:  Procedure Laterality Date  . COLONOSCOPY  06/18/2007   angulated and stenotic sigmoid colon with severe sigmoid diverticulosis  . EXPLORATORY LAPAROTOMY  08/03/2004   1) duodenal ulcer oversew, pyloroplasty, anterior, posterior and truncal vagotomies  . EXPLORATORY LAPAROTOMY  10/20/2004   1) duodenal ulcer oversew  with exclusion2) side-side gastrojejunostomy 3) feeding jejunostomy  . LAPAROSCOPY  08/14/2011   Procedure: LAPAROSCOPY DIAGNOSTIC;  Surgeon: DShann Medal MD;  Location: [facility]  ORS;  Service: General;  Laterality: N/A;  . LAPAROTOMY  08/14/2011   Procedure: EXPLORATORY LAPAROTOMY;  Surgeon: DShann Medal MD;  Location: [facility] ORS;  Service: General;  Laterality: N/A;  exploratory laparotomy, lysis of adhesions, revision gastrojejunostomy, upper endoscopy  . OPEN REDUCTION INTERNAL FIXATION (ORIF) DISTAL RADIAL FRACTURE Right 05/01/2016   Procedure: OPEN REDUCTION INTERNAL FIXATION (ORIF) DISTAL RADIAL FRACTURE, right;  Surgeon: KLeanora Cover MD;  [facility]  Service: Orthopedics;  Laterality: Right;  . TUBAL LIGATION  1977  . UPPER GASTROINTESTINAL ENDOSCOPY  09/16/2007; 12/21/2008; 03/17/2011   2009: Normal 2010: Normal with enteroscopy2012: Gastrojejunal  anastomotic ulcer, gastric retention   Social History:  reports that  has never smoked. she has never used smokeless tobacco. She reports that she drinks about 4.2 oz of alcohol per week. She reports that she does not use drugs. Allergies:  Allergies  Allergen Reactions  . Sulfa Antibiotics Anaphylaxis and Rash  . Sulfonamide Derivatives Anaphylaxis and Rash  . Penicillins Other (See Comments)    Has patient had a PCN reaction causing immediate rash, facial/tongue/throat swelling, SOB or lightheadedness with hypotension: No Has patient had a PCN reaction causing severe rash involving mucus membranes or skin necrosis: No Has patient had a PCN reaction that required hospitalization No Has patient had a PCN reaction occurring within the last 10 years: No If all of the above answers are "NO", then may proceed with Cephalosporin use.  .Marland KitchenHydromorphone Hcl  Rash   Family History  Problem Relation Age of Onset  . Stroke Father     Medications:  Scheduled: . docusate sodium  100 mg Oral BID  . enoxaparin (LOVENOX) injection  30 mg Subcutaneous Q24H  . ferrous sulfate  325 mg Oral BID WC  . folic acid  1 mg Oral Daily  . multivitamin with minerals  1 tablet Oral Daily  . sodium chloride  2 g  Oral TID WC  . thiamine  100 mg Oral Daily   Or  . thiamine  100 mg Intravenous Daily   ROS:as per HPI otherwise non contrib to renal isues Blood pressure (!) 138/53, pulse (!) 59, temperature 98.5 F (36.9 C), temperature source Oral, resp. rate 16, height _0  (1.6 m), weight 42.2 kg (93 lb 1.6 oz), SpO2 99 %.  General appearance: cooperative Head: Normocephalic, without obvious abnormality, lac 1cm horiz above OS. Resp: clear to auscultation bilaterally Chest wall: no tenderness Cardio: regular rate and rhythm, S1, S2 normal, no murmur, click, rub or gallop GI: soft, non-tender; bowel sounds normal; no masses,  no organomegaly Extremities: extremities normal, atraumatic, no cyanosis or edema Skin: Skin color, texture, turgor normal. No rashes or lesions Neurologic: Grossly normal some bizarre humor with husband Results for orders placed or performed during the hospital encounter of 08/20/17 (from the past 48 hour(s))  Comprehensive metabolic panel     Status: Abnormal   Collection Time: 08/20/17 12:43 PM  Result Value Ref Range   Sodium 122 (L) 135 - 145 mmol/L   Potassium 4.0 3.5 - 5.1 mmol/L   Chloride 87 (L) 101 - 111 mmol/L   CO2 23 22 - 32 mmol/L   Glucose, Bld 107 (H) 65 - 99 mg/dL   BUN 12 6 - 20 mg/dL   Creatinine, Ser 0.49 0.44 - 1.00 mg/dL   Calcium 8.7 (L) 8.9 - 10.3 mg/dL   Total Protein 6.2 (L) 6.5 - 8.1 g/dL   Albumin 3.7 3.5 - 5.0 g/dL   AST 35 15 - 41 U/L   ALT 22 14 - 54 U/L   Alkaline Phosphatase 104 38 - 126 U/L   Total Bilirubin 0.5 0.3 - 1.2 mg/dL   GFR calc non Af Amer >60 >60 mL/min   GFR calc Af Amer >60 >60 mL/min    Comment: (NOTE) The eGFR has been calculated using the CKD EPI equation. This calculation has not been validated in all clinical situations. eGFR's persistently <60 mL/min signify possible Chronic Kidney Disease.    Anion gap 12 5 - 15  CBC with Differential     Status: Abnormal   Collection Time: 08/20/17 12:43 PM  Result Value  Ref Range   WBC 9.2 4.0 - 10.5 K/uL   RBC 3.44 (L) 3.87 - 5.11 MIL/uL   Hemoglobin 7.9 (L) 12.0 - 15.0 g/dL   HCT 24.5 (L) 36.0 - 46.0 %   MCV 71.2 (L) 78.0 - 100.0 fL   MCH 23.0 (L) 26.0 - 34.0 pg   MCHC 32.2 30.0 - 36.0 g/dL   RDW 15.6 (H) 11.5 - 15.5 %   Platelets 459 (H) 150 - 400 K/uL   Neutrophils Relative % 81 %   Neutro Abs 7.4 1.7 - 7.7 K/uL   Lymphocytes Relative 8 %   Lymphs Abs 0.8 0.7 - 4.0 K/uL   Monocytes Relative 11 %   Monocytes Absolute 1.0 0.1 - 1.0 K/uL   Eosinophils Relative 0 %   Eosinophils Absolute 0.0 0.0 -  0.7 K/uL   Basophils Relative 0 %   Basophils Absolute 0.0 0.0 - 0.1 K/uL  Type and screen [place]     Status: None   Collection Time: 08/20/17 12:50 PM  Result Value Ref Range   ABO/RH(D) O POS    Antibody Screen NEG    Sample Expiration 08/23/2017   ABO/Rh     Status: None   Collection Time: 08/20/17 12:50 PM  Result Value Ref Range   ABO/RH(D) O POS   POC occult blood, ED Provider will collect     Status: None   Collection Time: 08/20/17  1:54 PM  Result Value Ref Range   Fecal Occult Bld NEGATIVE NEGATIVE  Osmolality     Status: Abnormal   Collection Time: 08/20/17  3:03 PM  Result Value Ref Range   Osmolality 261 (L) 275 - 295 mOsm/kg  Ferritin     Status: Abnormal   Collection Time: 08/20/17  3:03 PM  Result Value Ref Range   Ferritin 3 (L) 11 - 307 ng/mL  Urinalysis, Routine w reflex microscopic     Status: Abnormal   Collection Time: 08/20/17  3:24 PM  Result Value Ref Range   Color, Urine YELLOW YELLOW   APPearance CLEAR CLEAR   Specific Gravity, Urine 1.015 1.005 - 1.030   pH 6.0 5.0 - 8.0   Glucose, UA NEGATIVE NEGATIVE mg/dL   Hgb urine dipstick NEGATIVE NEGATIVE   Bilirubin Urine NEGATIVE NEGATIVE   Ketones, ur 5 (A) NEGATIVE mg/dL   Protein, ur NEGATIVE NEGATIVE mg/dL   Nitrite NEGATIVE NEGATIVE   Leukocytes, UA NEGATIVE NEGATIVE  Na and K (sodium & potassium), rand urine     Status: None    Collection Time: 08/20/17  3:27 PM  Result Value Ref Range   Sodium, Ur <10 mmol/L   Potassium Urine 20 mmol/L  Osmolality, urine     Status: None   Collection Time: 08/20/17  3:28 PM  Result Value Ref Range   Osmolality, Ur 480 300 - 900 mOsm/kg    Comment: CORRECTED RESULTS CALLED TO: K. Lequire [phone] [place] ON 01/31 AT 1613: PREVIOUSLY REPORTED AS 323   Basic metabolic panel     Status: Abnormal   Collection Time: 08/20/17  7:33 PM  Result Value Ref Range   Sodium 122 (L) 135 - 145 mmol/L   Potassium 3.5 3.5 - 5.1 mmol/L   Chloride 86 (L) 101 - 111 mmol/L   CO2 24 22 - 32 mmol/L   Glucose, Bld 156 (H) 65 - 99 mg/dL   BUN 9 6 - 20 mg/dL   Creatinine, Ser 0.54 0.44 - 1.00 mg/dL   Calcium 8.6 (L) 8.9 - 10.3 mg/dL   GFR calc non Af Amer >60 >60 mL/min   GFR calc Af Amer >60 >60 mL/min    Comment: (NOTE) The eGFR has been calculated using the CKD EPI equation. This calculation has not been validated in all clinical situations. eGFR's persistently <60 mL/min signify possible Chronic Kidney Disease.    Anion gap 12 5 - 15  Basic metabolic panel     Status: Abnormal   Collection Time: 08/21/17  3:18 AM  Result Value Ref Range   Sodium 119 (LL) 135 - 145 mmol/L    Comment: CRITICAL RESULT CALLED TO, READ BACK BY AND VERIFIED WITH: CARSON J,RN 08/21/17 0516 WAYK    Potassium 3.5 3.5 - 5.1 mmol/L   Chloride 86 (L) 101 - 111 mmol/L  CO2 24 22 - 32 mmol/L   Glucose, Bld 122 (H) 65 - 99 mg/dL   BUN 6 6 - 20 mg/dL   Creatinine, Ser 0.42 (L) 0.44 - 1.00 mg/dL   Calcium 8.7 (L) 8.9 - 10.3 mg/dL   GFR calc non Af Amer >60 >60 mL/min   GFR calc Af Amer >60 >60 mL/min    Comment: (NOTE) The eGFR has been calculated using the CKD EPI equation. This calculation has not been validated in all clinical situations. eGFR's persistently <60 mL/min signify possible Chronic Kidney Disease.    Anion gap 9 5 - 15  CBC     Status: Abnormal   Collection Time: 08/21/17  3:18  AM  Result Value Ref Range   WBC 7.5 4.0 - 10.5 K/uL   RBC 3.49 (L) 3.87 - 5.11 MIL/uL   Hemoglobin 8.0 (L) 12.0 - 15.0 g/dL   HCT 24.9 (L) 36.0 - 46.0 %   MCV 71.3 (L) 78.0 - 100.0 fL   MCH 22.9 (L) 26.0 - 34.0 pg   MCHC 32.1 30.0 - 36.0 g/dL   RDW 15.6 (H) 11.5 - 15.5 %   Platelets 431 (H) 150 - 400 K/uL   No results found.  Assessment:  1 Euvolemic Hyponatremia, prob due to hypotonic fluids and low solute diet   Plan: 1 Fluid restrict and education 2 Begin salt tablets 3 Dietician to see  [name] 08/21/2017, 10:37 AM

## 2017-08-21 NOTE — Progress Notes (Signed)
Initial Nutrition Assessment  DOCUMENTATION CODES:   Underweight, Non-severe (moderate) malnutrition in context of chronic illness  INTERVENTION:   -Ensure Enlive po BID, each supplement provides 350 kcal and 20 grams of protein -MVI daily  NUTRITION DIAGNOSIS:   Moderate Malnutrition related to chronic illness(ETOH dependence) as evidenced by mild fat depletion, moderate fat depletion, mild muscle depletion, moderate muscle depletion.  GOAL:   Patient will meet greater than or equal to 90% of their needs  MONITOR:   PO intake, Supplement acceptance, Labs, Weight trends, Skin, I & O's  REASON FOR ASSESSMENT:   Consult Diet education  ASSESSMENT:   67 year old female with PMH of hyponatremia, alcohol dependence, currently weaned down to 1 beer per day, anxiety, HTN, NSAID induced duodenal ulcer, anemia was sent by her PCP due to fall at home and hitting her forehead on day prior to admission and low sodium.  Admitted for hyponatremia.   Pt admitted with hyponatremia. Pt with hx of alcohol dependence.   Spoke with pt, who was very tangential at time of visit. She was difficult to obtain hx from. No family at bedside to confirm hx. Pt reports she typically consumes 2 meals per day- meal consist of foods such as graham crackers, tuna/chicken salad with crackers, or chicken tenders. Pt consumed about 75% of lunch time meal of chicken salad, strawberry shortcake, and graham crackers. She reports being very hungry at time of visit, despite recently finishing lunch, requesting graham crackers or chicken tenders.   Pt endorses wt loss, however, unable to provide much insight into this. She reports UBW is around 105#. Noted over the past year, pt has gained wt, which is favorable given malnutrition.   Although RD was consulted for education, RD did not provide education at this time, as pt was not appropriate and no family members present at time of visit.   Labs reviewed: Na: 121.    NUTRITION - FOCUSED PHYSICAL EXAM:    Most Recent Value  Orbital Region  Moderate depletion  Upper Arm Region  Moderate depletion  Thoracic and Lumbar Region  Moderate depletion  Buccal Region  Mild depletion  Temple Region  Mild depletion  Clavicle Bone Region  Moderate depletion  Clavicle and Acromion Bone Region  Moderate depletion  Scapular Bone Region  Moderate depletion  Dorsal Hand  Mild depletion  Patellar Region  Moderate depletion  Anterior Thigh Region  Moderate depletion  Posterior Calf Region  Moderate depletion  Edema (RD Assessment)  Mild  Hair  Reviewed  Eyes  Reviewed  Mouth  Reviewed  Skin  Reviewed  Nails  Reviewed       Diet Order:  Fall precautions Diet regular Room service appropriate? Yes; Fluid consistency: Thin; Fluid restriction: 1200 mL Fluid  EDUCATION NEEDS:   Education needs have been addressed  Skin:  Skin Assessment: Reviewed RN Assessment  Last BM:  08/20/17  Height:   Ht Readings from Last 1 Encounters:  08/20/17 5\' 3"  (1.6 m)    Weight:   Wt Readings from Last 1 Encounters:  08/20/17 93 lb 1.6 oz (42.2 kg)    Ideal Body Weight:  52.3 kg  BMI:  Body mass index is 16.49 kg/m.  Estimated Nutritional Needs:   Kcal:  1450-1650  Protein:  70-85 grams  Fluid:  1.4-1.6 L    [name] A. [name], RD, LDN, CDE Pager: [phone] After hours Pager: [phone]

## 2017-08-21 NOTE — Consult Note (Addendum)
See separate note by Dr Britt BottomAlvin C. Lowell GuitarPowell on the same day   Nephrology Consult Note: [name] is a [phone] WF with PMH significant for hyponatremia, alcohol abuse, anxiety presenting from PCP's office c/w hyponatremia after fall. In ED, Na 118 at PCP then 122 in ED. Clinically euvolemic and malnourished. Reports 1 beer/day. TRH treated with fluid restriction to 1.2L and d/c ACEi.   Objective: Vital signs in last 24 hours: Temp:  [97.5 F (36.4 C)-98.5 F (36.9 C)] 97.5 F (36.4 C) (02/01 1346) Pulse Rate:  [50-78] 78 (02/01 1346) Resp:  [13-27] 16 (02/01 0515) BP: (129-154)/(53-79) 129/64 (02/01 1346) SpO2:  [99 %-100 %] 100 % (02/01 1346) Weight:  [93 lb 1.6 oz (42.2 kg)] 93 lb 1.6 oz (42.2 kg) (01/31 1909) Weight change:   Intake/Output from previous day: 01/31 0701 - 02/01 0700 In: 480 [P.O.:480] Out: -  Intake/Output this shift: Total I/O In: 580 [P.O.:580] Out: -   General appearance: alert, cooperative, appears older than stated age and cachectic  CV: RRR no murmur Resp: CTAB, easy WOB Abd: SNTND, no pain Ext: no edema  Lab Results: Recent Labs    08/20/17 1243 08/21/17 0318  WBC 9.2 7.5  HGB 7.9* 8.0*  HCT 24.5* 24.9*  PLT 459* 431*   BMET:  Recent Labs    08/21/17 0318 08/21/17 1153  NA 119* 121*  K 3.5 4.0  CL 86* 87*  CO2 24 24  GLUCOSE 122* 98  BUN 6 6  CREATININE 0.42* 0.39*  CALCIUM 8.7* 8.2*   No results for input(s): PTH in the last 72 hours. Iron Studies:  Recent Labs    08/20/17 1503  FERRITIN 3*    Studies/Results: Dg Chest 2 View  Result Date: 08/21/2017 CLINICAL DATA:  Hyponatremia and history of tobacco use EXAM: CHEST  2 VIEW COMPARISON:  11/07/2015 FINDINGS: Cardiac shadow is mildly accentuated by the frontal technique. The lungs are well aerated bilaterally. Very mild interstitial changes are seen. Some fullness is noted in the right hilum. This is not as well appreciated on the lateral projection. No focal infiltrate or  effusion is noted. No bony abnormality is seen. IMPRESSION: Fullness in the right hilum is noted. CT of the chest with contrast is recommended for further evaluation. Electronically Signed   By: Alcide CleverMark  Lukens M.D.   On: 08/21/2017 13:14    Assessment/Plan: Kirsten HarmanDiane M Vonbargen is a 67yo female presenting with hyponatremia.   Hyponatremia: admit Na 122>119>121. Euvolemic on exam. Consider beer potomania/"tea and toast diet" vs glucocorticoid deficiency vs SIADH (TSH already checked and normal) if Na >40 and urine osm >100. Broad differential for SIADH would include HIV infection, pulmonary disease, malignancy (of note patient cachetic and never had colonoscopy).  -free water restriction -begin salt tabs  -q12H BMPs -recheck urine osm and urine sodium 2/2  Anemia: stable. labs c/w iron deficiency anemia, may be worsened by malnutrition. Would recommend PCP ensure cancer screenings are up to date.  -per primary  Alcohol abuse: CIWA protocol per primary.   Anxiety: hx of benzo use, management per primary.    LOS: 1 day   Loni MuseKate Timberlake 08/21/2017,3:03 PM   Renal Attending: See my note dated 2/1 at 10:35AM Lauris PoagAlvin C Quanell Loughney, MD

## 2017-08-21 NOTE — Telephone Encounter (Signed)
I do not at this time

## 2017-08-22 LAB — NA AND K (SODIUM & POTASSIUM), RAND UR
Potassium Urine: 29 mmol/L
Sodium, Ur: <10 mmol/L

## 2017-08-22 LAB — BASIC METABOLIC PANEL
ANION GAP: 10 (ref 5–15)
BUN: 10 mg/dL (ref 6–20)
CHLORIDE: 91 mmol/L — AB (ref 101–111)
CO2: 25 mmol/L (ref 22–32)
Calcium: 8.4 mg/dL — ABNORMAL LOW (ref 8.9–10.3)
Creatinine, Ser: 0.58 mg/dL (ref 0.44–1.00)
GFR calc non Af Amer: >60 mL/min (ref >60)
Glucose, Bld: 112 mg/dL — ABNORMAL HIGH (ref 65–99)
Potassium: 3.7 mmol/L (ref 3.5–5.1)
Sodium: 126 mmol/L — ABNORMAL LOW (ref 135–145)

## 2017-08-22 LAB — CBC
HEMATOCRIT: 23.4 % — AB (ref 36.0–46.0)
HEMOGLOBIN: 7.7 g/dL — AB (ref 12.0–15.0)
MCH: 23.9 pg — AB (ref 26.0–34.0)
MCHC: 32.9 g/dL (ref 30.0–36.0)
MCV: 72.7 fL — AB (ref 78.0–100.0)
Platelets: 429 10*3/uL — ABNORMAL HIGH (ref 150–400)
RBC: 3.22 MIL/uL — AB (ref 3.87–5.11)
RDW: 16.2 % — ABNORMAL HIGH (ref 11.5–15.5)
WBC: 7.5 10*3/uL (ref 4.0–10.5)

## 2017-08-22 LAB — OSMOLALITY, URINE: Osmolality, Ur: 580 mOsm/kg (ref 300–900)

## 2017-08-22 LAB — FERRITIN: FERRITIN: 5 ng/mL — AB (ref 11–307)

## 2017-08-22 LAB — IRON AND TIBC
IRON: 62 ug/dL (ref 28–170)
SATURATION RATIOS: 13 % (ref 10.4–31.8)
TIBC: 480 ug/dL — AB (ref 250–450)
UIBC: 418 ug/dL

## 2017-08-22 MED ORDER — SODIUM CHLORIDE 0.9 % IV SOLN
510.0000 mg | Freq: Once | INTRAVENOUS | Status: AC
  Administered 2017-08-22: 510 mg via INTRAVENOUS
  Filled 2017-08-22: qty 17

## 2017-08-22 NOTE — Progress Notes (Addendum)
Subjective: Interval History: patient standing at doorway without clothes. Redirected back to bed. States she is leaving today. Endorses eating breakfast, didn't like the toast.   Objective: Vital signs in last 24 hours: Temp:  [97.5 F (36.4 C)-98.2 F (36.8 C)] 98.2 F (36.8 C) (02/02 0730) Pulse Rate:  [64-79] 79 (02/02 0730) Resp:  [19-20] 19 (02/02 0730) BP: (127-139)/(55-65) 139/65 (02/02 0730) SpO2:  [100 %] 100 % (02/02 0730) Weight change:   Intake/Output from previous day: 02/01 0701 - 02/02 0700 In: 1110 [P.O.:1110] Out: -  Intake/Output this shift: No intake/output data recorded.  General appearance: alert, cooperative, appears older than stated age and cachectic  CV: RRR no murmur Resp: CTAB, easy WOB Abd: SNTND, no pain Ext: no edema    Lab Results: Recent Labs    08/21/17 0318 08/22/17 0319  WBC 7.5 7.5  HGB 8.0* 7.7*  HCT 24.9* 23.4*  PLT 431* 429*   BMET:  Recent Labs    08/21/17 1941 08/22/17 0319  NA 126* 126*  K 3.9 3.7  CL 92* 91*  CO2 21* 25  GLUCOSE 54* 112*  BUN 11 10  CREATININE 0.53 0.58  CALCIUM 8.1* 8.4*   No results for input(s): PTH in the last 72 hours. Iron Studies:  Recent Labs    08/20/17 1503  FERRITIN 3*    Studies/Results: Dg Chest 2 View  Result Date: 08/21/2017 CLINICAL DATA:  Hyponatremia and history of tobacco use EXAM: CHEST  2 VIEW COMPARISON:  11/07/2015 FINDINGS: Cardiac shadow is mildly accentuated by the frontal technique. The lungs are well aerated bilaterally. Very mild interstitial changes are seen. Some fullness is noted in the right hilum. This is not as well appreciated on the lateral projection. No focal infiltrate or effusion is noted. No bony abnormality is seen. IMPRESSION: Fullness in the right hilum is noted. CT of the chest with contrast is recommended for further evaluation. Electronically Signed   By: Alcide CleverMark  Lukens M.D.   On: 08/21/2017 13:14    Assessment/Plan: [name] is a 67yo  female presenting with hyponatremia.   Hyponatremia: admit Na 122>119>121>126>126. Euvolemic on exam. Consider beer potomania/"tea and toast diet" vs glucocorticoid deficiency vs SIADH (TSH already checked and normal) if Na >40 and urine osm >100. Broad differential for SIADH would include HIV infection, pulmonary disease, malignancy (of note patient cachetic and never had colonoscopy).  -free water restriction -continue salt tabs  -recheck urine osm and urine sodium 2/2, awaiting collection  Anemia: stable. labs c/w iron deficiency anemia, may be worsened by malnutrition. Would recommend PCP ensure cancer screenings are up to date.  -per primary  Alcohol abuse: CIWA protocol per primary.   Anxiety: hx of benzo use, management per primary.      LOS: 2 days   Loni MuseKate Timberlake 08/22/2017,10:52 AM   Renal Attending: Sodium better.  This is not SIADH. She is bizarre in affect.  Husband concerned.  Maybe Psychiatry should evaluate. Iron deficiency consistent with her history of post op intestinal surgical malabsorption. She needs IV iron, so I will order. Lauris PoagAlvin C Vanilla [place]ington

## 2017-08-22 NOTE — Progress Notes (Addendum)
PROGRESS NOTE    [name]  UYQ:[id number] DOB: 10/19/1950 DOA: 08/20/2017 PCP: Gordy Savers, MD   Brief Narrative: 67 year old female with history of hyponatremia, alcohol dependence mostly beer, hypertension, chronic anemia who was sent by PCP due to fall at home and hitting her forehead on the day of admission associated with hyponatremia.  Assessment & Plan:  #Hyponatremia likely subacute/chronic in the setting of beer/low solid intake: Patient is euvolemic -Serum sodium level 126.  Currently on salt tablet.  Monitor BMP.  Repeat urine sodium, osmolality.  TSH level acceptable.  Discussed with nephrology team.  Encourage oral intake.  Minimize free water intake.  #Severe protein calorie malnutrition: Dietary referral, encourage oral intake.  #Alcohol abuse with no sign of withdrawal.  Continue vitamins and supportive care.  Education provided.  #Essential hypertension: Currently off lisinopril.  Monitor blood pressure.  #Probably chronic iron deficiency anemia: Check iron studies.  No sign of active bleed.  Hemoglobin 7.7.  Continue oral iron.  Recommended to follow-up with GI outpatient for endoscopy evaluation.  PT OT evaluation and provide current supportive care.  DVT prophylaxis: Lovenox subcutaneous Code Status: Full code Family Communication: No family at bedside Disposition Plan: Discharge home in 1-2 days    Consultants:   Nephrology  Procedures: None Antimicrobials: None  Subjective: Seen and examined at bedside.  Wanted to go home today.  Denies headache, dizziness, nausea vomiting chest pain shortness of breath.  Objective: Vitals:   08/21/17 1346 08/21/17 2101 08/22/17 0443 08/22/17 0730  BP: 129/64 130/64 (!) 127/55 139/65  Pulse: 78 64 72 79  Resp:  19 20 19   Temp: (!) 97.5 F (36.4 C) 98 F (36.7 C) (!) 97.5 F (36.4 C) 98.2 F (36.8 C)  TempSrc: Oral  Oral Oral  SpO2: 100% 100% 100% 100%  Weight:      Height:         Intake/Output Summary (Last 24 hours) at 08/22/2017 1132 Last data filed at 08/21/2017 2300 Gross per 24 hour  Intake 820 ml  Output -  Net 820 ml   Filed Weights   08/20/17 1236 08/20/17 1909  Weight: 42.2 kg (93 lb 1.6 oz) 42.2 kg (93 lb 1.6 oz)    Examination:  General exam: Thin elderly looking female not in distress Respiratory system: Clear to auscultation. Respiratory effort normal. No wheezing or crackle Cardiovascular system: S1 & S2 heard, RRR.  No pedal edema. Gastrointestinal system: Abdomen is nondistended, soft and nontender. Normal bowel sounds heard. Central nervous system: Alert awake and following commands Skin: No rashes, lesions or ulcers   Data Reviewed: I have personally reviewed following labs and imaging studies  CBC: Recent Labs  Lab 08/19/17 1552 08/20/17 1243 08/21/17 0318 08/22/17 0319  WBC 9.1 9.2 7.5 7.5  NEUTROABS 8.0* 7.4  --   --   HGB 8.4 Repeated and verified X2.* 7.9* 8.0* 7.7*  HCT 26.2* 24.5* 24.9* 23.4*  MCV 73.3* 71.2* 71.3* 72.7*  PLT 490.0* 459* 431* 429*   Basic Metabolic Panel: Recent Labs  Lab 08/20/17 1933 08/21/17 0318 08/21/17 1153 08/21/17 1941 08/22/17 0319  NA 122* 119* 121* 126* 126*  K 3.5 3.5 4.0 3.9 3.7  CL 86* 86* 87* 92* 91*  CO2 24 24 24  21* 25  GLUCOSE 156* 122* 98 54* 112*  BUN 9 6 6 11 10   CREATININE 0.54 0.42* 0.39* 0.53 0.58  CALCIUM 8.6* 8.7* 8.2* 8.1* 8.4*   GFR: Estimated Creatinine Clearance: 46.7 mL/min (by C-G formula  based on SCr of 0.58 mg/dL). Liver Function Tests: Recent Labs  Lab 08/20/17 1243  AST 35  ALT 22  ALKPHOS 104  BILITOT 0.5  PROT 6.2*  ALBUMIN 3.7   No results for input(s): LIPASE, AMYLASE in the last 168 hours. No results for input(s): AMMONIA in the last 168 hours. Coagulation Profile: No results for input(s): INR, PROTIME in the last 168 hours. Cardiac Enzymes: No results for input(s): CKTOTAL, CKMB, CKMBINDEX, TROPONINI in the last 168 hours. BNP (last 3  results) No results for input(s): PROBNP in the last 8760 hours. HbA1C: No results for input(s): HGBA1C in the last 72 hours. CBG: Recent Labs  Lab 08/21/17 2253  GLUCAP 108*   Lipid Profile: No results for input(s): CHOL, HDL, LDLCALC, TRIG, CHOLHDL, LDLDIRECT in the last 72 hours. Thyroid Function Tests: Recent Labs    08/19/17 1552  TSH 1.03   Anemia Panel: Recent Labs    08/19/17 1552 08/20/17 1503  FERRITIN 3.5* 3*   Sepsis Labs: No results for input(s): PROCALCITON, LATICACIDVEN in the last 168 hours.  Recent Results (from the past 240 hour(s))  Urine Culture     Status: None   Collection Time: 08/19/17  4:12 PM  Result Value Ref Range Status   MICRO NUMBER: [id number]  Final   SPECIMEN QUALITY: ADEQUATE  Final   Sample Source URINE  Final   STATUS: FINAL  Final   ISOLATE 1:   Final    Single organism less than 10,000 CFU/mL isolated. These organisms, commonly found on external and internal genitalia, are considered colonizers. No further testing performed.         Radiology Studies: Dg Chest 2 View  Result Date: 08/21/2017 CLINICAL DATA:  Hyponatremia and history of tobacco use EXAM: CHEST  2 VIEW COMPARISON:  11/07/2015 FINDINGS: Cardiac shadow is mildly accentuated by the frontal technique. The lungs are well aerated bilaterally. Very mild interstitial changes are seen. Some fullness is noted in the right hilum. This is not as well appreciated on the lateral projection. No focal infiltrate or effusion is noted. No bony abnormality is seen. IMPRESSION: Fullness in the right hilum is noted. CT of the chest with contrast is recommended for further evaluation. Electronically Signed   By: Alcide Clever M.D.   On: 08/21/2017 13:14        Scheduled Meds: . docusate sodium  100 mg Oral BID  . enoxaparin (LOVENOX) injection  30 mg Subcutaneous Q24H  . ferrous sulfate  325 mg Oral BID WC  . folic acid  1 mg Oral Daily  . multivitamin with minerals  1 tablet Oral  Daily  . sodium chloride  2 g Oral TID WC  . thiamine  100 mg Oral Daily   Or  . thiamine  100 mg Intravenous Daily   Continuous Infusions:   LOS: 2 days    Ajanee Buren Jaynie Collins, MD [facility] Pager [phone]  If 7PM-7AM, please contact night-coverage [email or web address] Password [facility] 08/22/2017, 11:32 AM

## 2017-08-22 NOTE — Progress Notes (Signed)
Iron infusion completed,no signs of reaction noted. Pt will be monitored

## 2017-08-22 NOTE — Progress Notes (Signed)
Feraheme administering as ordered,no signs of allergic reaction noted

## 2017-08-23 DIAGNOSIS — F1099 Alcohol use, unspecified with unspecified alcohol-induced disorder: Secondary | ICD-10-CM

## 2017-08-23 DIAGNOSIS — E440 Moderate protein-calorie malnutrition: Secondary | ICD-10-CM

## 2017-08-23 DIAGNOSIS — R451 Restlessness and agitation: Secondary | ICD-10-CM

## 2017-08-23 DIAGNOSIS — F411 Generalized anxiety disorder: Secondary | ICD-10-CM

## 2017-08-23 DIAGNOSIS — Z560 Unemployment, unspecified: Secondary | ICD-10-CM

## 2017-08-23 DIAGNOSIS — G4700 Insomnia, unspecified: Secondary | ICD-10-CM

## 2017-08-23 DIAGNOSIS — Z008 Encounter for other general examination: Secondary | ICD-10-CM

## 2017-08-23 LAB — BASIC METABOLIC PANEL
Anion gap: 9 (ref 5–15)
BUN: 7 mg/dL (ref 6–20)
CALCIUM: 8.4 mg/dL — AB (ref 8.9–10.3)
CO2: 25 mmol/L (ref 22–32)
CREATININE: 0.35 mg/dL — AB (ref 0.44–1.00)
Chloride: 94 mmol/L — ABNORMAL LOW (ref 101–111)
GFR calc non Af Amer: >60 mL/min (ref >60)
Glucose, Bld: 100 mg/dL — ABNORMAL HIGH (ref 65–99)
Potassium: 3.3 mmol/L — ABNORMAL LOW (ref 3.5–5.1)
Sodium: 128 mmol/L — ABNORMAL LOW (ref 135–145)

## 2017-08-23 LAB — CBC
HCT: 22.2 % — ABNORMAL LOW (ref 36.0–46.0)
Hemoglobin: 7.1 g/dL — ABNORMAL LOW (ref 12.0–15.0)
MCH: 23.4 pg — ABNORMAL LOW (ref 26.0–34.0)
MCHC: 32 g/dL (ref 30.0–36.0)
MCV: 73 fL — ABNORMAL LOW (ref 78.0–100.0)
PLATELETS: 420 10*3/uL — AB (ref 150–400)
RBC: 3.04 MIL/uL — AB (ref 3.87–5.11)
RDW: 16.7 % — AB (ref 11.5–15.5)
WBC: 6.1 10*3/uL (ref 4.0–10.5)

## 2017-08-23 LAB — VITAMIN B12: VITAMIN B 12: 356 pg/mL (ref 180–914)

## 2017-08-23 LAB — AMMONIA: Ammonia: 55 umol/L — ABNORMAL HIGH (ref 9–35)

## 2017-08-23 MED ORDER — POTASSIUM CHLORIDE CRYS ER 20 MEQ PO TBCR
40.0000 meq | EXTENDED_RELEASE_TABLET | Freq: Once | ORAL | Status: AC
  Administered 2017-08-23: 40 meq via ORAL
  Filled 2017-08-23: qty 2

## 2017-08-23 MED ORDER — GABAPENTIN 100 MG PO CAPS
200.0000 mg | ORAL_CAPSULE | Freq: Two times a day (BID) | ORAL | Status: DC
  Administered 2017-08-23 – 2017-08-24 (×3): 200 mg via ORAL
  Filled 2017-08-23 (×3): qty 2

## 2017-08-23 NOTE — Evaluation (Signed)
Physical Therapy Evaluation Patient Details Name: Kirsten HarmanDiane M Rivera MRN: [id number] DOB: 10-Apr-1951 Today's Date: 08/23/2017   History of Present Illness  67 year old female with PMH of hyponatremia, alcohol dependence, currently weaned down to 1 beer per day, anxiety, HTN, NSAID induced duodenal ulcer, anemia was sent by her PCP due to fall at home and hitting her forehead on day prior to admission and low sodium.  Admitted for hyponatremia.    Clinical Impression  Pt admitted with above diagnosis. Pt currently with functional limitations due to the deficits listed below (see PT Problem List). PTA pt independent with all functional mobility. She lives at home with her husband. On eval, pt required min assist transfers and ambulation 150 feet without AD. Pt demonstrates balance deficits and muscular weakness. Pt very sleepy at time of eval due to not sleeping last night.  Pt will benefit from skilled PT to increase their independence and safety with mobility to allow discharge to the venue listed below.       Follow Up Recommendations Home health PT;Supervision/Assistance - 24 hour(I suspect pt will decline HHPT.)    Equipment Recommendations  None recommended by PT    Recommendations for Other Services       Precautions / Restrictions Precautions Precautions: Fall Restrictions Weight Bearing Restrictions: No      Mobility  Bed Mobility Overal bed mobility: Independent                Transfers Overall transfer level: Needs assistance Equipment used: None Transfers: Sit to/from Stand Sit to Stand: Min assist         General transfer comment: min assist to power up and stabilize initial standing balance  Ambulation/Gait Ambulation/Gait assistance: Min assist Ambulation Distance (Feet): 150 Feet Assistive device: None Gait Pattern/deviations: Step-through pattern;Shuffle;Decreased stride length;Narrow base of support Gait velocity: decreased Gait velocity  interpretation: Below normal speed for age/gender General Gait Details: unsteady gait. Pt declining use of cane or RW. Pt insisted on walking in crocs which added to LOB due to shuffle gait.  Stairs            Wheelchair Mobility    Modified Rankin (Stroke Patients Only)       Balance Overall balance assessment: Needs assistance Sitting-balance support: No upper extremity supported;Feet supported Sitting balance-Leahy Scale: Normal     Standing balance support: No upper extremity supported;During functional activity Standing balance-Leahy Scale: Poor Standing balance comment: frequent LOB during ambulation. No DME.                             Pertinent Vitals/Pain Pain Assessment: No/denies pain    Home Living Family/patient expects to be discharged to:: Private residence Living Arrangements: Spouse/significant other Available Help at Discharge: Family;Available 24 hours/day Type of Home: Other(Comment)(townhouse) Home Access: Stairs to enter Entrance Stairs-Rails: None Entrance Stairs-Number of Steps: 1 step, 1 threshold Home Layout: One level Home Equipment: None      Prior Function Level of Independence: Independent         Comments: Pt prefers to sponge bathe     Hand Dominance   Dominant Hand: Right    Extremity/Trunk Assessment   Upper Extremity Assessment Upper Extremity Assessment: Defer to OT evaluation    Lower Extremity Assessment Lower Extremity Assessment: Generalized weakness    Cervical / Trunk Assessment Cervical / Trunk Assessment: Other exceptions Cervical / Trunk Exceptions: forward head and rounded shoulders  Communication   Communication: No difficulties  Cognition Arousal/Alertness: Awake/alert Behavior During Therapy: Flat affect;Agitated Overall Cognitive Status: Impaired/Different from baseline Area of Impairment: Safety/judgement;Following commands                       Following Commands:  Follows one step commands with increased time Safety/Judgement: Decreased awareness of safety;Decreased awareness of deficits     General Comments: Pt unaware of LOB during transfers and during mobility      General Comments General comments (skin integrity, edema, etc.): Pt's husband reported pt up all night last night. She called him at 4am this morning.    Exercises     Assessment/Plan    PT Assessment Patient needs continued PT services  PT Problem List Decreased strength;Decreased mobility;Decreased safety awareness;Decreased knowledge of precautions;Decreased activity tolerance;Decreased balance       PT Treatment Interventions Gait training;Therapeutic activities;Therapeutic exercise;Patient/family education;Stair training;Balance training;Functional mobility training    PT Goals (Current goals can be found in the Care Plan section)  Acute Rehab PT Goals Patient Stated Goal: get back to bed PT Goal Formulation: With patient Time For Goal Achievement: 09/06/17 Potential to Achieve Goals: Fair    Frequency Min 3X/week   Barriers to discharge        Co-evaluation               AM-PAC PT "6 Clicks" Daily Activity  Outcome Measure Difficulty turning over in bed (including adjusting bedclothes, sheets and blankets)?: None Difficulty moving from lying on back to sitting on the side of the bed? : None Difficulty sitting down on and standing up from a chair with arms (e.g., wheelchair, bedside commode, etc,.)?: A Little Help needed moving to and from a bed to chair (including a wheelchair)?: A Little Help needed walking in hospital room?: A Little Help needed climbing 3-5 steps with a railing? : A Little 6 Click Score: 20    End of Session Equipment Utilized During Treatment: Gait belt Activity Tolerance: Patient limited by lethargy Patient left: in bed;with call bell/phone within reach;with family/visitor present;with bed alarm set Nurse Communication: Mobility  status PT Visit Diagnosis: Unsteadiness on feet (R26.81);Muscle weakness (generalized) (M62.81)    Time: 0912-0922 PT Time Calculation (min) (ACUTE ONLY): 10 min   Charges:   PT Evaluation $PT Eval Low Complexity: 1 Low     PT G Codes:        Aida Raider, PT  Office # [phone] Pager [phone]   Ilda Foil 08/23/2017, 10:26 AM

## 2017-08-23 NOTE — Progress Notes (Signed)
PROGRESS NOTE    [name]  NWG:[id number] DOB: April 21, 1951 DOA: 08/20/2017 PCP: Gordy Savers, MD   Brief Narrative: 67 year old female with history of hyponatremia, alcohol dependence mostly beer, hypertension, chronic anemia who was sent by PCP due to fall at home and hitting her forehead on the day of admission associated with hyponatremia.  Assessment & Plan:  #Hyponatremia likely subacute/chronic in the setting of beer/low solid intake: Patient is euvolemic -Serum sodium level 128.  Currently on salt tablet.  Monitor BMP.  Repeat urine sodium, osmolality.  TSH level acceptable.   -Repeat lab in the morning.  #Hypokalemia: Repleted potassium chloride.  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  Patient with history of alcohol abuse.  She was agitated overnight which is likely acute delirium.  TSH level acceptable.  I am checking ammonia level and vitamin B12 level.  Psychiatric consult requested to evaluate her mood change.  Patient is alert awake and oriented x2 this am  She has swelling in her mood  #Severe protein calorie malnutrition: Dietary referral, encourage oral intake.  #Alcohol abuse with no sign of withdrawal.  Continue vitamins and supportive care.  Education provided.  #Essential hypertension: Currently off lisinopril.  Monitor blood pressure.  #chronic iron deficiency anemia: Iron studies consistent with iron deficiency.  He received a dose of IV iron.  Continue oral iron.  Hemoglobin dropped to 7.1.  No sign of active bleed.  Repeat CBC in the morning.  Hold off on transfusion today.  Continue oral iron.  Recommended to follow-up with GI outpatient for endoscopy evaluation.  PT OT evaluation and provide current supportive care.  Social worker consulted for possible discharge to facility, as patient has been worried about taking patient  home because of her confusion.  DVT prophylaxis: Lovenox subcutaneous Code Status: Full code Family Communication: Discussed with the patient's husband at bedside Disposition Plan: Discharge home in 1-2 days    Consultants:   Nephrology  Procedures: None Antimicrobials: None  Subjective: Seen and examined at bedside.  Patient was sleeping.  She was alert awake and oriented to [facility] February 2019 in name.  She was having swelling in her mood.  Her husband at bedside. Objective: Vitals:   08/22/17 2015 08/23/17 0509 08/23/17 0705 08/23/17 1137  BP: (!) 144/61 140/70 129/64 (!) 112/55  Pulse: 78 66 65 68  Resp: 18 18 18    Temp: 98.3 F (36.8 C) 98.7 F (37.1 C) 99.1 F (37.3 C)   TempSrc: Oral Oral Oral   SpO2: 100% 98% 99%   Weight:      Height:        Intake/Output Summary (Last 24 hours) at 08/23/2017 1228 Last data filed at 08/23/2017 0950 Gross per 24 hour  Intake 730 ml  Output 450 ml  Net 280 ml   Filed Weights   08/20/17 1236 08/20/17 1909  Weight: 42.2 kg (93 lb 1.6 oz) 42.2 kg (93 lb 1.6 oz)    Examination:  General exam: Thin elderly looking female lying in bed. Respiratory system: Clear bilateral, respiratory for normal. Cardiovascular system: Regular rate rhythm S1-S2 normal.  No pedal edema. Gastrointestinal system: Abdomen is nondistended, soft and nontender. Normal bowel sounds heard. Central nervous system: Alert awake and following commands Skin: No rashes, lesions or ulcers   Data Reviewed: I have personally reviewed following labs and imaging studies  CBC: Recent Labs  Lab 08/19/17 1552 08/20/17 1243  08/21/17 0318 08/22/17 0319 08/23/17 0812  WBC 9.1 9.2 7.5 7.5 6.1  NEUTROABS 8.0* 7.4  --   --   --   HGB 8.4 Repeated and verified X2.* 7.9* 8.0* 7.7* 7.1*  HCT 26.2* 24.5* 24.9* 23.4* 22.2*  MCV 73.3* 71.2* 71.3* 72.7* 73.0*  PLT 490.0* 459* 431* 429* 420*   Basic Metabolic Panel: Recent Labs  Lab 08/21/17 0318  08/21/17 1153 08/21/17 1941 08/22/17 0319 08/23/17 0812  NA 119* 121* 126* 126* 128*  K 3.5 4.0 3.9 3.7 3.3*  CL 86* 87* 92* 91* 94*  CO2 24 24 21* 25 25  GLUCOSE 122* 98 54* 112* 100*  BUN 6 6 11 10 7   CREATININE 0.42* 0.39* 0.53 0.58 0.35*  CALCIUM 8.7* 8.2* 8.1* 8.4* 8.4*   GFR: Estimated Creatinine Clearance: 46.7 mL/min (A) (by C-G formula based on SCr of 0.35 mg/dL (L)). Liver Function Tests: Recent Labs  Lab 08/20/17 1243  AST 35  ALT 22  ALKPHOS 104  BILITOT 0.5  PROT 6.2*  ALBUMIN 3.7   No results for input(s): LIPASE, AMYLASE in the last 168 hours. No results for input(s): AMMONIA in the last 168 hours. Coagulation Profile: No results for input(s): INR, PROTIME in the last 168 hours. Cardiac Enzymes: No results for input(s): CKTOTAL, CKMB, CKMBINDEX, TROPONINI in the last 168 hours. BNP (last 3 results) No results for input(s): PROBNP in the last 8760 hours. HbA1C: No results for input(s): HGBA1C in the last 72 hours. CBG: Recent Labs  Lab 08/21/17 2253  GLUCAP 108*   Lipid Profile: No results for input(s): CHOL, HDL, LDLCALC, TRIG, CHOLHDL, LDLDIRECT in the last 72 hours. Thyroid Function Tests: No results for input(s): TSH, T4TOTAL, FREET4, T3FREE, THYROIDAB in the last 72 hours. Anemia Panel: Recent Labs    08/20/17 1503 08/22/17 1247  FERRITIN 3* 5*  TIBC  --  480*  IRON  --  62   Sepsis Labs: No results for input(s): PROCALCITON, LATICACIDVEN in the last 168 hours.  Recent Results (from the past 240 hour(s))  Urine Culture     Status: None   Collection Time: 08/19/17  4:12 PM  Result Value Ref Range Status   MICRO NUMBER: [id number]  Final   SPECIMEN QUALITY: ADEQUATE  Final   Sample Source URINE  Final   STATUS: FINAL  Final   ISOLATE 1:   Final    Single organism less than 10,000 CFU/mL isolated. These organisms, commonly found on external and internal genitalia, are considered colonizers. No further testing performed.          Radiology Studies: Dg Chest 2 View  Result Date: 08/21/2017 CLINICAL DATA:  Hyponatremia and history of tobacco use EXAM: CHEST  2 VIEW COMPARISON:  11/07/2015 FINDINGS: Cardiac shadow is mildly accentuated by the frontal technique. The lungs are well aerated bilaterally. Very mild interstitial changes are seen. Some fullness is noted in the right hilum. This is not as well appreciated on the lateral projection. No focal infiltrate or effusion is noted. No bony abnormality is seen. IMPRESSION: Fullness in the right hilum is noted. CT of the chest with contrast is recommended for further evaluation. Electronically Signed   By: Alcide Clever M.D.   On: 08/21/2017 13:14        Scheduled Meds: . docusate sodium  100 mg Oral BID  . enoxaparin (LOVENOX) injection  30 mg Subcutaneous Q24H  . ferrous sulfate  325 mg Oral BID WC  . folic acid  1 mg  Oral Daily  . multivitamin with minerals  1 tablet Oral Daily  . sodium chloride  2 g Oral TID WC  . thiamine  100 mg Oral Daily   Or  . thiamine  100 mg Intravenous Daily   Continuous Infusions:   LOS: 3 days    Paislee Szatkowski Jaynie Collins, MD [facility] Pager [phone]  If 7PM-7AM, please contact night-coverage [email or web address] Password [facility] 08/23/2017, 12:28 PM

## 2017-08-23 NOTE — Progress Notes (Signed)
Prn ativan administered per CIWA scale for agitation. Most of the agitation directed towards her husband

## 2017-08-23 NOTE — Progress Notes (Signed)
Subjective: Interval History: patient standing at doorway without clothes. Redirected back to bed. States she is leaving today. Endorses eating breakfast, didn't like the toast.   Objective: Vital signs in last 24 hours: Temp:  [97.5 F (36.4 C)-99.1 F (37.3 C)] 99.1 F (37.3 C) (02/03 0705) Pulse Rate:  [65-78] 65 (02/03 0705) Resp:  [16-18] 18 (02/03 0705) BP: (129-159)/(61-83) 129/64 (02/03 0705) SpO2:  [98 %-100 %] 99 % (02/03 0705) Weight change:   Intake/Output from previous day: 02/02 0701 - 02/03 0700 In: 690 [P.O.:690] Out: 450 [Urine:450] Intake/Output this shift: No intake/output data recorded.  General appearance: alert, cooperative, appears older than stated age and cachectic  CV: RRR no murmur Resp: CTAB, easy WOB Abd: SNTND, no pain Ext: no edema    Lab Results: Recent Labs    08/21/17 0318 08/22/17 0319  WBC 7.5 7.5  HGB 8.0* 7.7*  HCT 24.9* 23.4*  PLT 431* 429*   BMET:  Recent Labs    08/21/17 1941 08/22/17 0319  NA 126* 126*  K 3.9 3.7  CL 92* 91*  CO2 21* 25  GLUCOSE 54* 112*  BUN 11 10  CREATININE 0.53 0.58  CALCIUM 8.1* 8.4*   No results for input(s): PTH in the last 72 hours. Iron Studies:  Recent Labs    08/22/17 1247  IRON 62  TIBC 480*  FERRITIN 5*    Studies/Results: Dg Chest 2 View  Result Date: 08/21/2017 CLINICAL DATA:  Hyponatremia and history of tobacco use EXAM: CHEST  2 VIEW COMPARISON:  11/07/2015 FINDINGS: Cardiac shadow is mildly accentuated by the frontal technique. The lungs are well aerated bilaterally. Very mild interstitial changes are seen. Some fullness is noted in the right hilum. This is not as well appreciated on the lateral projection. No focal infiltrate or effusion is noted. No bony abnormality is seen. IMPRESSION: Fullness in the right hilum is noted. CT of the chest with contrast is recommended for further evaluation. Electronically Signed   By: Alcide CleverMark  Lukens M.D.   On: 08/21/2017 13:14     Assessment/Plan: Annabelle HarmanDiane M Ticer is a 67yo female presenting with hyponatremia.   Hyponatremia: admit Na 122>119>121>126>126. Euvolemic on exam. Consider beer potomania/"tea and toast diet" and malnutrition. Urine osm 580, urine Na still low.  -free water restriction -continue salt tabs  -await labs  Anemia: stable. S/p iron infusion 2/2.  -per primary  Alcohol abuse: CIWA protocol per primary.   Anxiety: hx of benzo use, management per primary.      LOS: 3 days   Loni MuseKate Aneisha Skyles 08/23/2017,7:33 AM

## 2017-08-23 NOTE — Care Management Note (Signed)
Case Management Note  Patient Details  Name: Annabelle HarmanDiane M Sahm MRN: [id number] Date of Birth: [birth year]  Subjective/Objective:      Pt presented for hyponatremia identified by PCP and increasing AMS during last 2-3 weeks. Pt from home with husband. Pt states she is independent with activities and husband usually drives her to appointments and pharmacy.  Pt oriented but repeatedly diverts conversation to snacks she likes .        Action/Plan: [facility].  Pt states she doesn't believe she needs HH and declines at this time.  CM will continue to follow.  Expected Discharge Date:  08/24/17               Expected Discharge Plan:  Home/Self Care  In-House Referral:  NA  Discharge planning Services  CM Consult  Post Acute Care Choice:    Choice offered to:     DME Arranged:    DME Agency:     HH Arranged:    HH Agency:     Status of Service:  In process, will continue to follow  If discussed at Long Length of Stay Meetings, dates discussed:    Additional Comments:  Verdene LennertGoldean, Smith Mcnicholas K, RN 08/23/2017, 4:23 PM

## 2017-08-23 NOTE — Consult Note (Signed)
[facility] Consult   Reason for Consult:  Confusion and cognitive decline Referring Physician:  Dr. Carolin Sicks Patient Identification: [name] MRN:  [id number] Principal Diagnosis: Hyponatremia Diagnosis:   Patient Active Problem List   Diagnosis Date Noted  . Malnutrition of moderate degree [E44.0] 08/23/2017  . Hyponatremia [E87.1] 08/20/2017  . Anemia [D64.9] 08/20/2017  . Marginal ulcer, at gastrojejunostomy, resected 08/14/2011. [K28.9] 09/19/2011  . LUQ abdominal pain near former jejunostomy site [R10.12] 03/04/2011  . Chronic LLQ pain [R10.32] 02/04/2011  . 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 SHINGLES [B02.9] 08/19/2010  . B12 deficiency [E53.8] 10/02/2009  . HYPONATREMIA [E87.1] 02/02/2009  . Iron deficiency anemia [D50.9] 07/09/2007  . DIVERTICULOSIS [K57.30] 07/09/2007  . Essential hypertension [I10] 06/23/2007  . RAYNAUD'S SYNDROME [I73.00] 06/23/2007  . INSOMNIA [G47.00] 05/31/2007  . HEADACHE [R51] 05/31/2007  . Anxiety state [F41.1] 01/21/2007  . Alcoholism ([place]) [F10.20] 01/21/2007  . OSTEOPOROSIS [M81.0] 01/21/2007    Total Time spent with patient: 45 minutes  Subjective:   [name] is a 67 y.o. female patient admitted due to fall at home and hyponatremia  HPI:  Patient was interviewed in the presence of her husband of 2 year. She is a poor historian, most of the information obtained from her husband but she agreed with the information provided by her husband. Patient and her husband reports that she has history of Anxiety, Alcohol use disorder, possibly OCD, Vit B12 deficiency, Anemia, HTN,  NSAID induced ulcer. Husband reports that patient was sent to the hospital for evaluation after her PCP found her to have very low level of Sodium and husband reported a fall at home prior to that. She presents with confusion, combativeness and agitation. Patient and her husband reports that she used to consume a lot of alcohol  but now  weaned herself  down to  one beer bottle daily. Drank a bottle of beer about 2 weeks ago and does not believe that patient is  in alcohol withdrawal. Today, patient denies psychosis, delusions, but still gets agitated mainly towards her husband.  Past Psychiatric History: as above  Risk to Self: Is patient at risk for suicide?: No Risk to Others:   Prior Inpatient Therapy:   Prior Outpatient Therapy:    Past Medical History:  Past Medical History:  Diagnosis Date  . Alcoholism ([place]) 02/02/2009  . ANXIETY 01/21/2007  . Blood transfusion    hx of 2005   . DIVERTICULOSIS 07/09/2007  . Duodenal ulcer hemorrhage perforated 2006  . Headache(784.0) 05/31/2007   pt denies at 08/12/11 preop visit   . HYPERTENSION 06/23/2007  . HYPONATREMIA 02/02/2009   better off diuretic  . INSOMNIA 05/31/2007  . Iron deficiency anemia    pt denies at 08/12/11 visit   . NSAID-induced duodenal ulcer   . OSTEOPOROSIS 01/21/2007  . Raynaud's syndrome 06/23/2007  . Recurrent upper respiratory infection (URI)    pt reports slight cold using Nyquil prn at bedtime   . VITAMIN B12 DEFICIENCY 10/02/2009  . Zoster 2012   twice - left groin/vagina    Past Surgical History:  Procedure Laterality Date  . COLONOSCOPY  06/18/2007   angulated and stenotic sigmoid colon with severe sigmoid diverticulosis  . EXPLORATORY LAPAROTOMY  08/03/2004   1) duodenal ulcer oversew, pyloroplasty, anterior, posterior and truncal vagotomies  . EXPLORATORY LAPAROTOMY  10/20/2004   1) duodenal ulcer oversew  with exclusion2) side-side gastrojejunostomy 3) feeding jejunostomy  . LAPAROSCOPY  08/14/2011   Procedure: LAPAROSCOPY DIAGNOSTIC;  Surgeon: Shann Medal, MD;  Location: [facility];  Service: General;  Laterality: N/A;  . LAPAROTOMY  08/14/2011   Procedure: EXPLORATORY LAPAROTOMY;  Surgeon: Shann Medal, MD;  Location: [facility];  Service: General;  Laterality: N/A;  exploratory laparotomy, lysis of adhesions, revision  gastrojejunostomy, upper endoscopy  . OPEN REDUCTION INTERNAL FIXATION (ORIF) DISTAL RADIAL FRACTURE Right 05/01/2016   Procedure: OPEN REDUCTION INTERNAL FIXATION (ORIF) DISTAL RADIAL FRACTURE, right;  Surgeon: Leanora Cover, MD;  Location: [facility];  Service: Orthopedics;  Laterality: Right;  . TUBAL LIGATION  1977  . UPPER GASTROINTESTINAL ENDOSCOPY  09/16/2007; 12/21/2008; 03/17/2011   2009: Normal 2010: Normal with enteroscopy2012: Gastrojejunal  anastomotic ulcer, gastric retention   Family History:  Family History  Problem Relation Age of Onset  . Stroke Father    Family Psychiatric  History:  Social History:  Social History   Substance and Sexual Activity  Alcohol Use Yes  . Alcohol/week: 4.2 oz  . Types: 7 Cans of beer per week   Comment: 12 oz     Social History   Substance and Sexual Activity  Drug Use No    Social History   Socioeconomic History  . Marital status: Married    Spouse name: None  . Number of children: 1  . Years of education: None  . Highest education level: None  Social Needs  . Financial resource strain: None  . Food insecurity - worry: None  . Food insecurity - inability: None  . Transportation needs - medical: None  . Transportation needs - non-medical: None  Occupational History  . Occupation: retired    Fish farm manager: UNEMPLOYED  Tobacco Use  . Smoking status: Never Smoker  . Smokeless tobacco: Never Used  Substance and Sexual Activity  . Alcohol use: Yes    Alcohol/week: 4.2 oz    Types: 7 Cans of beer per week    Comment: 12 oz  . Drug use: No  . Sexual activity: None  Other Topics Concern  . None  Social History Narrative  . None   Additional Social History:    Allergies:   Allergies  Allergen Reactions  . Sulfa Antibiotics Anaphylaxis and Rash  . Sulfonamide Derivatives Anaphylaxis and Rash  . Penicillins Other (See Comments)    Has patient had a PCN reaction causing immediate rash, facial/tongue/throat  swelling, SOB or lightheadedness with hypotension: No Has patient had a PCN reaction causing severe rash involving mucus membranes or skin necrosis: No Has patient had a PCN reaction that required hospitalization No Has patient had a PCN reaction occurring within the last 10 years: No If all of the above answers are "NO", then may proceed with Cephalosporin use.  Marland Kitchen Hydromorphone Hcl Rash    Labs:  Results for orders placed or performed during the hospital encounter of 08/20/17 (from the past 48 hour(s))  Basic metabolic panel     Status: Abnormal   Collection Time: 08/21/17  7:41 PM  Result Value Ref Range   Sodium 126 (L) 135 - 145 mmol/L   Potassium 3.9 3.5 - 5.1 mmol/L   Chloride 92 (L) 101 - 111 mmol/L   CO2 21 (L) 22 - 32 mmol/L   Glucose, Bld 54 (L) 65 - 99 mg/dL   BUN 11 6 - 20 mg/dL   Creatinine, Ser 0.53 0.44 - 1.00 mg/dL   Calcium 8.1 (L) 8.9 - 10.3 mg/dL   GFR calc non Af Amer >60 >60 mL/min   GFR calc Af  Amer >60 >60 mL/min    Comment: (NOTE) The eGFR has been calculated using the CKD EPI equation. This calculation has not been validated in all clinical situations. eGFR's persistently <60 mL/min signify possible Chronic Kidney Disease.    Anion gap 13 5 - 15    Comment: Performed at [facility] [address] [place], [place] [address]  Glucose, capillary     Status: Abnormal   Collection Time: 08/21/17 10:53 PM  Result Value Ref Range   Glucose-Capillary 108 (H) 65 - 99 mg/dL  Basic metabolic panel     Status: Abnormal   Collection Time: 08/22/17  3:19 AM  Result Value Ref Range   Sodium 126 (L) 135 - 145 mmol/L   Potassium 3.7 3.5 - 5.1 mmol/L   Chloride 91 (L) 101 - 111 mmol/L   CO2 25 22 - 32 mmol/L   Glucose, Bld 112 (H) 65 - 99 mg/dL   BUN 10 6 - 20 mg/dL   Creatinine, Ser 0.58 0.44 - 1.00 mg/dL   Calcium 8.4 (L) 8.9 - 10.3 mg/dL   GFR calc non Af Amer >60 >60 mL/min   GFR calc Af Amer >60 >60 mL/min    Comment: (NOTE) The eGFR has been  calculated using the CKD EPI equation. This calculation has not been validated in all clinical situations. eGFR's persistently <60 mL/min signify possible Chronic Kidney Disease.    Anion gap 10 5 - 15    Comment: Performed at [facility] [address] [place], [place] [address]  CBC     Status: Abnormal   Collection Time: 08/22/17  3:19 AM  Result Value Ref Range   WBC 7.5 4.0 - 10.5 K/uL   RBC 3.22 (L) 3.87 - 5.11 MIL/uL   Hemoglobin 7.7 (L) 12.0 - 15.0 g/dL   HCT 23.4 (L) 36.0 - 46.0 %   MCV 72.7 (L) 78.0 - 100.0 fL   MCH 23.9 (L) 26.0 - 34.0 pg   MCHC 32.9 30.0 - 36.0 g/dL   RDW 16.2 (H) 11.5 - 15.5 %   Platelets 429 (H) 150 - 400 K/uL    Comment: Performed at [facility], [place] [address] [place], [place] [address]  Osmolality, urine     Status: None   Collection Time: 08/22/17 12:37 PM  Result Value Ref Range   Osmolality, Ur 580 300 - 900 mOsm/kg    Comment: Performed at [facility] [address], [place], [place] [address]  Na and K (sodium & potassium), rand urine     Status: None   Collection Time: 08/22/17 12:37 PM  Result Value Ref Range   Sodium, Ur <10 mmol/L    Comment: REPEATED TO VERIFY   Potassium Urine 29 mmol/L    Comment: Performed at [facility] [address] [place], [place] [address]  Iron and TIBC     Status: Abnormal   Collection Time: 08/22/17 12:47 PM  Result Value Ref Range   Iron 62 28 - 170 ug/dL   TIBC 480 (H) 250 - 450 ug/dL   Saturation Ratios 13 10.4 - 31.8 %   UIBC 418 ug/dL    Comment: Performed at [facility], [place] [address] [place], [place] [address]  Ferritin     Status: Abnormal   Collection Time: 08/22/17 12:47 PM  Result Value Ref Range   Ferritin 5 (L) 11 - 307 ng/mL    Comment: Performed at [facility], [place] [address], [place], [place] [address]  Basic metabolic panel     Status: Abnormal   Collection Time: 08/23/17  8:12 AM  Result Value Ref Range   Sodium 128 (L) 135 - 145  mmol/L   Potassium 3.3 (L) 3.5 - 5.1 mmol/L   Chloride 94 (L) 101 - 111 mmol/L   CO2 25 22 - 32 mmol/L   Glucose, Bld 100 (H) 65 - 99 mg/dL   BUN 7 6 - 20 mg/dL   Creatinine, Ser 0.35 (L) 0.44 - 1.00 mg/dL   Calcium 8.4 (L) 8.9 - 10.3 mg/dL   GFR calc non Af Amer >60 >60 mL/min   GFR calc Af Amer >60 >60 mL/min    Comment: (NOTE) The eGFR has been calculated using the CKD EPI equation. This calculation has not been validated in all clinical situations. eGFR's persistently <60 mL/min signify possible Chronic Kidney Disease.    Anion gap 9 5 - 15    Comment: Performed at [facility] [address] [place], [place] [address]  CBC     Status: Abnormal   Collection Time: 08/23/17  8:12 AM  Result Value Ref Range   WBC 6.1 4.0 - 10.5 K/uL   RBC 3.04 (L) 3.87 - 5.11 MIL/uL   Hemoglobin 7.1 (L) 12.0 - 15.0 g/dL   HCT 22.2 (L) 36.0 - 46.0 %   MCV 73.0 (L) 78.0 - 100.0 fL   MCH 23.4 (L) 26.0 - 34.0 pg   MCHC 32.0 30.0 - 36.0 g/dL   RDW 16.7 (H) 11.5 - 15.5 %   Platelets 420 (H) 150 - 400 K/uL    Comment: Performed at [facility], [place] [address], [place], [place] [address]  Vitamin B12     Status: None   Collection Time: 08/23/17  1:00 PM  Result Value Ref Range   Vitamin B-12 356 180 - 914 pg/mL    Comment: (NOTE) This assay is not validated for testing neonatal or myeloproliferative syndrome specimens for Vitamin B12 levels. Performed at [facility], [place] [address] [place], [place] [address]   Ammonia     Status: Abnormal   Collection Time: 08/23/17  1:00 PM  Result Value Ref Range   Ammonia 55 (H) 9 - 35 umol/L    Comment: Performed at [facility] [address], [place], [place] [address]    Current Facility-Administered Medications  Medication Dose Route Frequency Provider Last Rate Last Dose  . acetaminophen (TYLENOL) tablet 650 mg  650 mg Oral Q6H PRN Karmen Bongo, MD   650 mg at 08/22/17 2250   Or  . acetaminophen (TYLENOL)  suppository 650 mg  650 mg Rectal Q6H PRN Karmen Bongo, MD      . docusate sodium (COLACE) capsule 100 mg  100 mg Oral BID Karmen Bongo, MD   100 mg at 08/23/17 9509  . enoxaparin (LOVENOX) injection 30 mg  30 mg Subcutaneous Q24H Reginia Naas, RPH   30 mg at 08/22/17 2017  . ferrous sulfate tablet 325 mg  325 mg Oral BID WC Modena Jansky, MD   325 mg at 08/23/17 0733  . folic acid (FOLVITE) tablet 1 mg  1 mg Oral Daily Karmen Bongo, MD   1 mg at 08/23/17 3267  . gabapentin (NEURONTIN) capsule 200 mg  200 mg Oral BID , , MD      . hydrALAZINE (APRESOLINE) injection 5 mg  5 mg  Intravenous Q4H PRN Modena Jansky, MD      . LORazepam (ATIVAN) tablet 1 mg  1 mg Oral Q6H PRN Karmen Bongo, MD   1 mg at 08/23/17 1242   Or  . LORazepam (ATIVAN) injection 1 mg  1 mg Intravenous Q6H PRN Karmen Bongo, MD      . multivitamin with minerals tablet 1 tablet  1 tablet Oral Daily Karmen Bongo, MD   1 tablet at 08/23/17 0086  . ondansetron (ZOFRAN) tablet 4 mg  4 mg Oral Q6H PRN Karmen Bongo, MD       Or  . ondansetron [facility]) injection 4 mg  4 mg Intravenous Q6H PRN Karmen Bongo, MD      . sodium chloride tablet 2 g  2 g Oral TID WC Estanislado Emms, MD   2 g at 08/23/17 1132  . thiamine (VITAMIN B-1) tablet 100 mg  100 mg Oral Daily Karmen Bongo, MD   100 mg at 08/23/17 7619   Or  . thiamine (B-1) injection 100 mg  100 mg Intravenous Daily Karmen Bongo, MD   100 mg at 08/22/17 5093    Musculoskeletal: Strength & Muscle Tone: within normal limits Gait & Station: not tested Patient leans: N/A  Psychiatric Specialty Exam: Physical Exam  Psychiatric: Her speech is normal. Judgment and thought content normal. Her affect is angry. She is agitated. Cognition and memory are normal.    Review of Systems  Constitutional: Negative.   HENT: Negative.   Eyes: Negative.   Respiratory: Negative.   Cardiovascular: Negative.   Gastrointestinal: Negative.    Genitourinary: Negative.   Musculoskeletal: Negative.   Skin: Negative.   Neurological: Negative.   Endo/Heme/Allergies: Negative.   Psychiatric/Behavioral: The patient has insomnia.     Blood pressure (!) 166/76, pulse 77, temperature 98.1 F (36.7 C), temperature source Axillary, resp. rate 18, height 5' 3" (1.6 m), weight 42.2 kg (93 lb 1.6 oz), SpO2 100 %.Body mass index is 16.49 kg/m.  General Appearance: Casual  Eye Contact:  Good  Speech:  Clear and Coherent  Volume:  Increased  Mood:  Irritable  Affect:  Labile  Thought Process:  Coherent  Orientation:  Full (Time, Place, and Person)  Thought Content:  Logical  Suicidal Thoughts:  No  Homicidal Thoughts:  No  Memory:  Immediate;   Fair Recent;   Fair Remote;   Fair  Judgement:  Other:  marginal  Insight:  marginal  Psychomotor Activity:  Increased  Concentration:  Concentration: Fair and Attention Span: Fair  Recall:  [employer] of Knowledge:  Fair  Language:  Good  Akathisia:  No  Handed:  Right  AIMS (if indicated):     Assets:  Communication Skills Desire for Improvement Social Support  ADL's:  Intact  Cognition:  WNL  Sleep:   fair     Treatment Plan Summary: Patient who was admitted for evaluation due to Hyponatremia. Confusion, agitation and irritability may be due to Hyponatremia. I was not able to elicit any cognitive decline.  Plan/Recomendations: -Cognitive decline: Patient may benefit from Neurology referral for full work up -Confusion: Correct sodium level -CT scan head to rule out subdural hematoma secondary to fall -Liver function test/ammonia level in a patient with long history of Alcoholism, elevated ammonia level can cause confusion and behavior changes. -Gabapentin 200 mg bid for agitation/alcohol withdrawal symptoms. -I do not see any acute psychiatric problem as patient's current symptoms could be due to Hyponatremia and Neurological problems. -Re-consult  psych service if necessary  in future.  Disposition: No evidence of imminent risk to self or others at present.   Patient does not meet criteria for psychiatric inpatient admission. Supportive therapy provided about ongoing stressors.  Corena Pilgrim, MD 08/23/2017 3:08 PM

## 2017-08-23 NOTE — Evaluation (Signed)
Occupational Therapy Evaluation Patient Details Name: [name] MRN: [id number] DOB: 04/04/1951 Today's Date: 08/23/2017    History of Present Illness 67 year old female with PMH of hyponatremia, alcohol dependence, currently weaned down to 1 beer per day, anxiety, HTN, NSAID induced duodenal ulcer, anemia was sent by her PCP due to fall at home and hitting her forehead on day prior to admission and low sodium.  Admitted for hyponatremia.   Clinical Impression   PTA Pt independent in ADL and mobility (with increased unsteadiness recently). Pt does prefer to sponge bath and does not use their walk in shower. Pt is currently supervision for seated ADL and min guard assist for any ADL in standing. Pt was very sleepy during session and required max encouragement for participation this session. Husband arrived at the end of session and reported that she was up all day and night. OT will continue to follow in the acute setting prior to dc and will focus on balance during ADL and safety awareness.     Follow Up Recommendations  No OT follow up;Supervision/Assistance - 24 hour    Equipment Recommendations  None recommended by OT    Recommendations for Other Services       Precautions / Restrictions Precautions Precautions: Fall Restrictions Weight Bearing Restrictions: No      Mobility Bed Mobility Overal bed mobility: Independent                Transfers Overall transfer level: Needs assistance Equipment used: None Transfers: Sit to/from Stand Sit to Stand: Min assist         General transfer comment: min A for balance and boost    Balance Overall balance assessment: Needs assistance Sitting-balance support: No upper extremity supported;Feet supported Sitting balance-Leahy Scale: Normal     Standing balance support: No upper extremity supported;Single extremity supported Standing balance-Leahy Scale: Poor Standing balance comment: LOB frequent during mobility - no  DME                           ADL either performed or assessed with clinical judgement   ADL Overall ADL's : Needs assistance/impaired Eating/Feeding: Modified independent   Grooming: Wash/dry hands;Min guard;Minimal assistance;Standing Grooming Details (indicate cue type and reason): sink level, close min guard A for balance Upper Body Bathing: Sitting;Supervision/ safety   Lower Body Bathing: Supervison/ safety;Sitting/lateral leans   Upper Body Dressing : Supervision/safety   Lower Body Dressing: Supervision/safety;Sitting/lateral leans Lower Body Dressing Details (indicate cue type and reason): able to don socks without assist Toilet Transfer: Min guard;Minimal assistance;Ambulation;Comfort height toilet Toilet Transfer Details (indicate cue type and reason): min guard assist for balance during ambulation to toilet Toileting- Clothing Manipulation and Hygiene: Min guard;Sit to/from stand Toileting - Clothing Manipulation Details (indicate cue type and reason): Pt able to perform peri care without problem, but required vc to remember to pull up underwear prior to walking to sink for grooming     Functional mobility during ADLs: Min guard;Minimal assistance(for balance) General ADL Comments: Pt supervision level for sitting ADL, min guard assist for balance during any standing activity     Vision Baseline Vision/History: Wears glasses Wears Glasses: At all times Patient Visual Report: No change from baseline       Perception     Praxis      Pertinent Vitals/Pain Pain Assessment: No/denies pain     Hand Dominance Right   Extremity/Trunk Assessment Upper Extremity Assessment Upper Extremity Assessment: Generalized weakness  Lower Extremity Assessment Lower Extremity Assessment: Defer to PT evaluation   Cervical / Trunk Assessment Cervical / Trunk Assessment: Other exceptions Cervical / Trunk Exceptions: forward head and rounded shoulders    Communication Communication Communication: No difficulties   Cognition Arousal/Alertness: Awake/alert Behavior During Therapy: Flat affect Overall Cognitive Status: Impaired/Different from baseline Area of Impairment: Safety/judgement;Following commands                       Following Commands: Follows one step commands with increased time Safety/Judgement: Decreased awareness of safety;Decreased awareness of deficits     General Comments: Pt unaware of LOB during transfers and during mobility   General Comments  Pt's husband came in at the end of session and stated that Pt had been up all day the day before and all night (she even called him at 4am)    Exercises     Shoulder Instructions      Home Living Family/patient expects to be discharged to:: Private residence Living Arrangements: Spouse/significant other Available Help at Discharge: Family Type of Home: Other(Comment)(Townhome) Home Access: Stairs to enter [employer] of Steps: 1 step, 1 threshold Entrance Stairs-Rails: None       Bathroom Shower/Tub: Producer, television/film/video: Standard     Home Equipment: None          Prior Functioning/Environment Level of Independence: Independent        Comments: Pt prefers to sponge bathe        OT Problem List: Decreased activity tolerance;Impaired balance (sitting and/or standing);Decreased safety awareness      OT Treatment/Interventions: Self-care/ADL training;DME and/or AE instruction;Therapeutic activities;Patient/family education;Balance training    OT Goals(Current goals can be found in the care plan section) Acute Rehab OT Goals Patient Stated Goal: get back to bed OT Goal Formulation: With patient Time For Goal Achievement: 09/06/17 Potential to Achieve Goals: Good  OT Frequency: Min 2X/week   Barriers to D/C:            Co-evaluation              AM-PAC PT "6 Clicks" Daily Activity     Outcome Measure  Help from another person eating meals?: None Help from another person taking care of personal grooming?: A Little Help from another person toileting, which includes using toliet, bedpan, or urinal?: A Little Help from another person bathing (including washing, rinsing, drying)?: A Little Help from another person to put on and taking off regular upper body clothing?: None Help from another person to put on and taking off regular lower body clothing?: None 6 Click Score: 21   End of Session Equipment Utilized During Treatment: Gait belt Nurse Communication: Mobility status  Activity Tolerance: Patient tolerated treatment well(Pt agitated at being awoken ) Patient left: in bed;with call bell/phone within reach;with bed alarm set;with nursing/sitter in room  OT Visit Diagnosis: Unsteadiness on feet (R26.81);Other abnormalities of gait and mobility (R26.89);Adult, failure to thrive (R62.7)                Time: 1478-2956 OT Time Calculation (min): 10 min Charges:  OT General Charges $OT Visit: 1 Visit OT Evaluation $OT Eval Low Complexity: 1 Low G-Codes:     Sherryl Manges OTR/L [phone]  Evern Bio Geraldine Tesar 08/23/2017, 10:06 AM

## 2017-08-24 ENCOUNTER — Encounter (HOSPITAL_COMMUNITY): Payer: Self-pay | Admitting: General Practice

## 2017-08-24 ENCOUNTER — Encounter: Payer: PPO | Admitting: Psychology

## 2017-08-24 LAB — CBC
HCT: 22.3 % — ABNORMAL LOW (ref 36.0–46.0)
Hemoglobin: 7.2 g/dL — ABNORMAL LOW (ref 12.0–15.0)
MCH: 23.8 pg — ABNORMAL LOW (ref 26.0–34.0)
MCHC: 32.3 g/dL (ref 30.0–36.0)
MCV: 73.6 fL — AB (ref 78.0–100.0)
Platelets: 416 10*3/uL — ABNORMAL HIGH (ref 150–400)
RBC: 3.03 MIL/uL — AB (ref 3.87–5.11)
RDW: 16.8 % — ABNORMAL HIGH (ref 11.5–15.5)
WBC: 9.1 10*3/uL (ref 4.0–10.5)

## 2017-08-24 LAB — BASIC METABOLIC PANEL
ANION GAP: 9 (ref 5–15)
BUN: 6 mg/dL (ref 6–20)
CO2: 24 mmol/L (ref 22–32)
Calcium: 8.4 mg/dL — ABNORMAL LOW (ref 8.9–10.3)
Chloride: 94 mmol/L — ABNORMAL LOW (ref 101–111)
Creatinine, Ser: 0.41 mg/dL — ABNORMAL LOW (ref 0.44–1.00)
Glucose, Bld: 103 mg/dL — ABNORMAL HIGH (ref 65–99)
POTASSIUM: 4.3 mmol/L (ref 3.5–5.1)
SODIUM: 127 mmol/L — AB (ref 135–145)

## 2017-08-24 LAB — HEPATIC FUNCTION PANEL
ALBUMIN: 3.2 g/dL — AB (ref 3.5–5.0)
ALT: 17 U/L (ref 14–54)
AST: 22 U/L (ref 15–41)
Alkaline Phosphatase: 109 U/L (ref 38–126)
Bilirubin, Direct: <0.1 mg/dL — ABNORMAL LOW (ref 0.1–0.5)
TOTAL PROTEIN: 5.7 g/dL — AB (ref 6.5–8.1)
Total Bilirubin: 0.4 mg/dL (ref 0.3–1.2)

## 2017-08-24 LAB — AMMONIA: Ammonia: 53 umol/L — ABNORMAL HIGH (ref 9–35)

## 2017-08-24 MED ORDER — GABAPENTIN 100 MG PO CAPS: 200.0000 mg | ORAL_CAPSULE | Freq: Two times a day (BID) | ORAL | 0 refills | Status: DC

## 2017-08-24 MED ORDER — FERROUS SULFATE 325 (65 FE) MG PO TABS: 325.0000 mg | ORAL_TABLET | Freq: Two times a day (BID) | ORAL | 0 refills | Status: DC

## 2017-08-24 MED ORDER — FOLIC ACID 1 MG PO TABS: 1.0000 mg | ORAL_TABLET | Freq: Every day | ORAL | 0 refills | Status: DC

## 2017-08-24 MED ORDER — SODIUM CHLORIDE 1 G PO TABS: 2.0000 g | ORAL_TABLET | Freq: Two times a day (BID) | ORAL | 0 refills | Status: DC

## 2017-08-24 MED ORDER — LACTULOSE 10 GM/15ML PO SOLN
20.0000 g | Freq: Two times a day (BID) | ORAL | Status: DC
  Administered 2017-08-24: 20 g via ORAL
  Filled 2017-08-24: qty 30

## 2017-08-24 MED ORDER — LACTULOSE 10 GM/15ML PO SOLN: 20.0000 g | Freq: Two times a day (BID) | ORAL | 0 refills | Status: DC | PRN

## 2017-08-24 MED ORDER — ADULT MULTIVITAMIN W/MINERALS CH: 1.0000 | ORAL_TABLET | Freq: Every day | ORAL | 0 refills | Status: DC

## 2017-08-24 NOTE — Care Management Note (Signed)
Case Management Note  Patient Details  Name: Annabelle HarmanDiane M Brenner MRN: [id number] Date of Birth: [birth year]  Subjective/Objective:                    Action/Plan:  Discussed home health with patient and husband at bedside both agreeable  [facility]  Expected Discharge Date:  08/24/17               Expected Discharge Plan:  Home w Home Health Services  In-House Referral:  NA  Discharge planning Services  CM Consult  Post Acute Care Choice:  Home Health Choice offered to:  Patient, Spouse  DME Arranged:  N/A DME Agency:  NA  HH Arranged:  RN, PT, OT, Nurse's Aide HH Agency:  [facility]  Status of Service:  Completed, signed off  If discussed at Long Length of Stay Meetings, dates discussed:    Additional Comments:  Kingsley PlanWile, Williom Cedar Marie, RN 08/24/2017, 12:06 PM

## 2017-08-24 NOTE — Progress Notes (Signed)
Pt discharged home with husband in stable condition. Home health set up by care management. AVS and discharge scripts given before leaving unit

## 2017-08-24 NOTE — Discharge Summary (Signed)
Physician Discharge Summary  Kirsten HarmanDiane M Rivera ZOX:[id number] DOB: Apr 08, 1951 DOA: 08/20/2017  PCP: Gordy SaversKwiatkowski, Peter F, MD  Admit date: 08/20/2017 Discharge date: 08/24/2017  Admitted From:home Disposition:home  Recommendations for Outpatient Follow-up:  1. Follow up with PCP in 1-2 weeks 2. Please obtain BMP/CBC in one week  Home Health: Ordered Equipment/Devices:none Discharge Condition:stable CODE STATUS:full code Diet recommendation:regular  Brief/Interim Summary: 67 year old female with history of hyponatremia, alcohol dependence mostly beer, hypertension, chronic anemia who was sent by PCP due to fall at home and hitting her forehead on the day of admission associated with hyponatremia.  #Hyponatremia likely subacute/chronic in the setting of beer/low solute intake: Patient is euvolemic -Serum sodium level 127-128.  Clinically improved.  Plan to discharge with oral salt tablet with close monitoring of the labs.  I discussed with the patient and her husband in detail.  Consider lowering or discontinuation of salt tablet once serum sodium level 130 or above.  I have relayed this information to patient aspirin as well.  #Hypokalemia: Improved.  Encourage oral intake.  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  Patient with history of alcohol abuse.  TSH level acceptable.  Mild elevation in ammonia which is probably not contributing her symptoms.  I ordered lactulose as needed.  She was evaluated by psychiatrist, is started on Neurontin twice a day.  I discussed patient and husband to avoid benzos if possible.  Patient was alert awake and oriented [facility] and name.  She seems improved.  I recommended patient to follow-up with PCP and possibly neurologist outpatient. -home care services ordered  #Severe protein calorie malnutrition: Dietary  referral, encourage oral intake.  #Alcohol abuse with no sign of withdrawal.  Continue vitamins and supportive care.  Education provided.  #Essential hypertension: Currently off lisinopril.  Monitor blood pressure.  #chronic iron deficiency anemia: Iron studies consistent with iron deficiency.  She received a dose of IV iron.    Started on oral iron on discharge.  No sign of bleeding.  Recommended to repeat lab in a week and may benefit from outpatient endoscopy evaluation as age appropriate.  Patient is medically stable on discharge.  Discharge Diagnoses:  Principal Problem:   Hyponatremia Active Problems:   Anxiety state   Alcoholism (HCC)   Essential hypertension   Anemia   Malnutrition of moderate degree    Discharge Instructions  Discharge Instructions    Call MD for:  difficulty breathing, headache or visual disturbances   Complete by:  As directed    Call MD for:  extreme fatigue   Complete by:  As directed    Call MD for:  hives   Complete by:  As directed    Call MD for:  persistant dizziness or light-headedness   Complete by:  As directed    Call MD for:  persistant nausea and vomiting   Complete by:  As directed    Call MD for:  severe uncontrolled pain   Complete by:  As directed    Call MD for:  temperature >100.4   Complete by:  As directed    Diet general   Complete by:  As directed    Discharge instructions   Complete by:  As directed    Recheck lab, BMP in a week to monitor serum sodium level.  Consider lowering or discontinuation of salt tablet when serum low sodium level around 130 or above.  Face-to-face encounter (required for Medicare/Medicaid patients)   Complete by:  As directed    I Dron Jaynie Collins certify that this patient is under my care and that I, or a nurse practitioner or physician's assistant working with me, had a face-to-face encounter that meets the physician face-to-face encounter requirements with this patient on 08/24/2017.  The encounter with the patient was in whole, or in part for the following medical condition(s) which is the primary reason for home health care (List medical condition): confusion, weakness   The encounter with the patient was in whole, or in part, for the following medical condition, which is the primary reason for home health care:  confusion, weakness   I certify that, based on my findings, the following services are medically necessary home health services:   Nursing Physical therapy     Reason for Medically Necessary Home Health Services:  Skilled Nursing- Skilled Assessment/Observation   My clinical findings support the need for the above services:  Cognitive impairments, dementia, or mental confusion  that make it unsafe to leave home   Further, I certify that my clinical findings support that this patient is homebound due to:  Unable to leave home safely without assistance   Home Health   Complete by:  As directed    To provide the following care/treatments:   PT OT Home Health Aide RN     Increase activity slowly   Complete by:  As directed      Allergies as of 08/24/2017      Reactions   Sulfa Antibiotics Anaphylaxis, Rash   Sulfonamide Derivatives Anaphylaxis, Rash   Penicillins Other (See Comments)   Has patient had a PCN reaction causing immediate rash, facial/tongue/throat swelling, SOB or lightheadedness with hypotension: No Has patient had a PCN reaction causing severe rash involving mucus membranes or skin necrosis: No Has patient had a PCN reaction that required hospitalization No Has patient had a PCN reaction occurring within the last 10 years: No If all of the above answers are "NO", then may proceed with Cephalosporin use.   Hydromorphone Hcl Rash      Medication List    STOP taking these medications   lisinopril 20 MG tablet Commonly known as:  PRINIVIL,ZESTRIL     TAKE these medications   acetaminophen 500 MG tablet Commonly known as:  TYLENOL Take 500  mg by mouth every 6 (six) hours as needed for mild pain.   ALPRAZolam 0.5 MG tablet Commonly known as:  XANAX TAKE 1 TABLET BY MOUTH 3 TIMES A DAY AS NEEDED FOR ANXIETY   ferrous sulfate 325 (65 FE) MG tablet Take 1 tablet (325 mg total) by mouth 2 (two) times daily with a meal.   folic acid 1 MG tablet Commonly known as:  FOLVITE Take 1 tablet (1 mg total) by mouth daily. Start taking on:  08/25/2017   gabapentin 100 MG capsule Commonly known as:  NEURONTIN Take 2 capsules (200 mg total) by mouth 2 (two) times daily.   lactulose 10 GM/15ML solution Commonly known as:  CHRONULAC Take 30 mLs (20 g total) by mouth 2 (two) times daily as needed for mild constipation.   multivitamin with minerals Tabs tablet Take 1 tablet by mouth daily. Start taking on:  08/25/2017   ROLAIDS PO Take 2 tablets by mouth daily as needed (indigestion).   sodium chloride 1 g tablet Take 2 tablets (2 g total) by mouth 2 (two) times daily with a meal.  Follow-up Information    Gordy Savers, MD. Schedule an appointment as soon as possible for a visit in 1 week(s).   Specialty:  Internal Medicine Contact information: [address] [place] [address] [phone]          Allergies  Allergen Reactions  . Sulfa Antibiotics Anaphylaxis and Rash  . Sulfonamide Derivatives Anaphylaxis and Rash  . Penicillins Other (See Comments)    Has patient had a PCN reaction causing immediate rash, facial/tongue/throat swelling, SOB or lightheadedness with hypotension: No Has patient had a PCN reaction causing severe rash involving mucus membranes or skin necrosis: No Has patient had a PCN reaction that required hospitalization No Has patient had a PCN reaction occurring within the last 10 years: No If all of the above answers are "NO", then may proceed with Cephalosporin use.  Marland Kitchen Hydromorphone Hcl Rash     Consultations: Psychiatrist Nephrologist  Procedures/Studies: None  Subjective: Seen and examined at bedside.  She was alert awake and oriented x3.  Clinically improved.  Denies headache, dizziness, nausea vomiting chest pain or shortness of breath.  Discharge Exam: Vitals:   08/23/17 2008 08/24/17 0501  BP: (!) 145/60 (!) 116/51  Pulse: 67 75  Resp: 17 18  Temp: 99.1 F (37.3 C) 98.6 F (37 C)  SpO2: 100% 99%   Vitals:   08/23/17 1137 08/23/17 1442 08/23/17 2008 08/24/17 0501  BP: (!) 112/55 (!) 166/76 (!) 145/60 (!) 116/51  Pulse: 68 77 67 75  Resp:   17 18  Temp:  98.1 F (36.7 C) 99.1 F (37.3 C) 98.6 F (37 C)  TempSrc:  Axillary Oral Oral  SpO2:  100% 100% 99%  Weight:      Height:        General: Pt is alert, awake, not in acute distress Cardiovascular: RRR, S1/S2 +, no rubs, no gallops Respiratory: CTA bilaterally, no wheezing, no rhonchi Abdominal: Soft, NT, ND, bowel sounds + Extremities: no edema, no cyanosis    The results of significant diagnostics from this hospitalization (including imaging, microbiology, ancillary and laboratory) are listed below for reference.     Microbiology: Recent Results (from the past 240 hour(s))  Urine Culture     Status: None   Collection Time: 08/19/17  4:12 PM  Result Value Ref Range Status   MICRO NUMBER: [id number]  Final   SPECIMEN QUALITY: ADEQUATE  Final   Sample Source URINE  Final   STATUS: FINAL  Final   ISOLATE 1:   Final    Single organism less than 10,000 CFU/mL isolated. These organisms, commonly found on external and internal genitalia, are considered colonizers. No further testing performed.     Labs: BNP (last 3 results) No results for input(s): BNP in the last 8760 hours. Basic Metabolic Panel: Recent Labs  Lab 08/21/17 1153 08/21/17 1941 08/22/17 0319 08/23/17 0812 08/24/17 0523  NA 121* 126* 126* 128* 127*  K 4.0 3.9 3.7 3.3* 4.3  CL 87* 92* 91* 94* 94*  CO2 24 21* 25 25 24    GLUCOSE 98 54* 112* 100* 103*  BUN 6 11 10 7 6   CREATININE 0.39* 0.53 0.58 0.35* 0.41*  CALCIUM 8.2* 8.1* 8.4* 8.4* 8.4*   Liver Function Tests: Recent Labs  Lab 08/20/17 1243 08/24/17 0523  AST 35 22  ALT 22 17  ALKPHOS 104 109  BILITOT 0.5 0.4  PROT 6.2* 5.7*  ALBUMIN 3.7 3.2*   No results for input(s): LIPASE, AMYLASE in the last 168 hours.  Recent Labs  Lab 08/23/17 1300 08/24/17 0523  AMMONIA 55* 53*   CBC: Recent Labs  Lab 08/19/17 1552 08/20/17 1243 08/21/17 0318 08/22/17 0319 08/23/17 0812 08/24/17 0523  WBC 9.1 9.2 7.5 7.5 6.1 9.1  NEUTROABS 8.0* 7.4  --   --   --   --   HGB 8.4 Repeated and verified X2.* 7.9* 8.0* 7.7* 7.1* 7.2*  HCT 26.2* 24.5* 24.9* 23.4* 22.2* 22.3*  MCV 73.3* 71.2* 71.3* 72.7* 73.0* 73.6*  PLT 490.0* 459* 431* 429* 420* 416*   Cardiac Enzymes: No results for input(s): CKTOTAL, CKMB, CKMBINDEX, TROPONINI in the last 168 hours. BNP: Invalid input(s): POCBNP CBG: Recent Labs  Lab 08/21/17 2253  GLUCAP 108*   D-Dimer No results for input(s): DDIMER in the last 72 hours. Hgb A1c No results for input(s): HGBA1C in the last 72 hours. Lipid Profile No results for input(s): CHOL, HDL, LDLCALC, TRIG, CHOLHDL, LDLDIRECT in the last 72 hours. Thyroid function studies No results for input(s): TSH, T4TOTAL, T3FREE, THYROIDAB in the last 72 hours.  Invalid input(s): FREET3 Anemia work up Recent Labs    08/22/17 1247 08/23/17 1300  VITAMINB12  --  356  FERRITIN 5*  --   TIBC 480*  --   IRON 62  --    Urinalysis    Component Value Date/Time   COLORURINE YELLOW 08/20/2017 1524   APPEARANCEUR CLEAR 08/20/2017 1524   LABSPEC 1.015 08/20/2017 1524   PHURINE 6.0 08/20/2017 1524   GLUCOSEU NEGATIVE 08/20/2017 1524   HGBUR NEGATIVE 08/20/2017 1524   BILIRUBINUR NEGATIVE 08/20/2017 1524   BILIRUBINUR N 08/19/2017 1604   KETONESUR 5 (A) 08/20/2017 1524   PROTEINUR NEGATIVE 08/20/2017 1524   UROBILINOGEN 0.2 08/19/2017 1604    UROBILINOGEN 0.2 02/01/2014 0350   NITRITE NEGATIVE 08/20/2017 1524   LEUKOCYTESUR NEGATIVE 08/20/2017 1524   Sepsis Labs Invalid input(s): PROCALCITONIN,  WBC,  LACTICIDVEN Microbiology Recent Results (from the past 240 hour(s))  Urine Culture     Status: None   Collection Time: 08/19/17  4:12 PM  Result Value Ref Range Status   MICRO NUMBER: [id number]  Final   SPECIMEN QUALITY: ADEQUATE  Final   Sample Source URINE  Final   STATUS: FINAL  Final   ISOLATE 1:   Final    Single organism less than 10,000 CFU/mL isolated. These organisms, commonly found on external and internal genitalia, are considered colonizers. No further testing performed.     Time coordinating discharge:  30 minutes  SIGNED:   Maxie Barb, MD  [facility] 08/24/2017, 12:02 PM  If 7PM-7AM, please contact night-coverage [email or web address] Password [email or web address]

## 2017-08-24 NOTE — Care Management Important Message (Signed)
Important Message  Patient Details  Name: [name] MRN: [id number] Date of Birth: 1950-08-18   Medicare Important Message Given:  Yes    Sandhya Denherder P [name] 08/24/2017, 3:56 PM

## 2017-08-24 NOTE — Progress Notes (Signed)
Subjective: Interval History: patient lying in bed, states she needs her xanax.   Objective: Vital signs in last 24 hours: Temp:  [98.1 F (36.7 C)-99.1 F (37.3 C)] 98.6 F (37 C) (02/04 0501) Pulse Rate:  [67-77] 75 (02/04 0501) Resp:  [17-18] 18 (02/04 0501) BP: (112-166)/(51-76) 116/51 (02/04 0501) SpO2:  [99 %-100 %] 99 % (02/04 0501) Weight change:   Intake/Output from previous day: 02/03 0701 - 02/04 0700 In: 1080 [P.O.:1080] Out: 1400 [Urine:1400] Intake/Output this shift: Total I/O In: 240 [P.O.:240] Out: -   General appearance: alert, cooperative, appears older than stated age and cachectic  CV: RRR no murmur Resp: CTAB, easy WOB Abd: SNTND, no pain Ext: no edema  Lab Results: Recent Labs    08/23/17 0812 08/24/17 0523  WBC 6.1 9.1  HGB 7.1* 7.2*  HCT 22.2* 22.3*  PLT 420* 416*   BMET:  Recent Labs    08/23/17 0812 08/24/17 0523  NA 128* 127*  K 3.3* 4.3  CL 94* 94*  CO2 25 24  GLUCOSE 100* 103*  BUN 7 6  CREATININE 0.35* 0.41*  CALCIUM 8.4* 8.4*   No results for input(s): PTH in the last 72 hours. Iron Studies:  Recent Labs    08/22/17 1247  IRON 62  TIBC 480*  FERRITIN 5*    Studies/Results: No results found.  Assessment/Plan: [name] is a 67yo female presenting with hyponatremia.   Hyponatremia: admit Na 122>119>121>126>126>128. Euvolemic on exam. Consider beer potomania/"tea and toast diet" and malnutrition. Urine osm 580, urine Na still low.  -continue free water restriction -continue salt tabs  -PCP to d/c salt tabs when Na >130  Anemia: stable. S/p iron infusion 2/2.  -per primary  Alcohol abuse: CIWA protocol per primary.   Anxiety: hx of benzo use, management per primary.      LOS: 4 days   [name] 08/24/2017,11:25 AM

## 2017-08-25 ENCOUNTER — Telehealth: Payer: Self-pay | Admitting: *Deleted

## 2017-08-25 NOTE — Telephone Encounter (Signed)
Transition Care Management Follow-up Telephone Call  Per Discharge Summary: Discharge date: 08/24/2017  Admitted From:home Disposition:home  Recommendations for Outpatient Follow-up:  1. Follow up with PCP in 1-2 weeks 2. Please obtain BMP/CBC in one week  Home Health: Ordered Equipment/Devices:none Discharge Condition:stable CODE STATUS:full code Diet recommendation:regular  --  Call completed w/ patient's husband, Leon[place]Charles ([place]   How have you been since you were released from the hospital? "Actually, fairly good."   Do you understand why you were in the hospital? yes   Do you understand the discharge instructions? yes   Where were you discharged to? Home   Items Reviewed:  Medications reviewed: yes  Allergies reviewed: yes  Dietary changes reviewed: yes  Referrals reviewed: yes, none made per patient   Functional Questionnaire:   Activities of Daily Living (ADLs):   She states they are independent in the following: ambulation, bathing and hygiene, feeding, continence, grooming, toileting and dressing States they require assistance with the following: none   Any transportation issues/concerns?: no   Any patient concerns? yes, patient's husband would like to discuss if they should continue with scheduled cognitive testing in June.   Confirmed importance and date/time of follow-up visits scheduled yes  Provider Appointment booked with Dr. Eleonore ChiquitoPeter Kwiatkowski 09/03/17 @ 10:15am  Confirmed with patient if condition begins to worsen call PCP or go to the ER.  Patient was given the office number and encouraged to call back with question or concerns.  : yes

## 2017-08-26 ENCOUNTER — Telehealth: Payer: Self-pay | Admitting: Family Medicine

## 2017-08-26 DIAGNOSIS — F1010 Alcohol abuse, uncomplicated: Secondary | ICD-10-CM | POA: Diagnosis not present

## 2017-08-26 DIAGNOSIS — E871 Hypo-osmolality and hyponatremia: Secondary | ICD-10-CM | POA: Diagnosis not present

## 2017-08-26 DIAGNOSIS — Z8719 Personal history of other diseases of the digestive system: Secondary | ICD-10-CM | POA: Diagnosis not present

## 2017-08-26 DIAGNOSIS — M810 Age-related osteoporosis without current pathological fracture: Secondary | ICD-10-CM | POA: Diagnosis not present

## 2017-08-26 DIAGNOSIS — E43 Unspecified severe protein-calorie malnutrition: Secondary | ICD-10-CM | POA: Diagnosis not present

## 2017-08-26 DIAGNOSIS — I10 Essential (primary) hypertension: Secondary | ICD-10-CM | POA: Diagnosis not present

## 2017-08-26 DIAGNOSIS — I7300 Raynaud's syndrome without gangrene: Secondary | ICD-10-CM | POA: Diagnosis not present

## 2017-08-26 DIAGNOSIS — F419 Anxiety disorder, unspecified: Secondary | ICD-10-CM | POA: Diagnosis not present

## 2017-08-26 DIAGNOSIS — D509 Iron deficiency anemia, unspecified: Secondary | ICD-10-CM | POA: Diagnosis not present

## 2017-08-26 NOTE — Telephone Encounter (Signed)
Ok for verbal orders ?

## 2017-08-26 NOTE — Telephone Encounter (Signed)
Copied from CRM [id number]. Topic: Inquiry >> Aug 26, 2017  3:25 PM Jonette EvaBarksdale, Harvey B wrote: [facility] called and asked for orders of Weekly nurse visits of once a week for 3 weeks and as needed, contact [phone]

## 2017-08-28 ENCOUNTER — Telehealth: Payer: Self-pay | Admitting: Internal Medicine

## 2017-08-28 NOTE — Telephone Encounter (Signed)
Copied from CRM [id number]. Topic: General - Other >> Aug 28, 2017  3:42 PM Cecelia ByarsGreen, Tanieka Pownall L, RMA wrote: Reason for CRM: [facility] is requesting verbal orders for nursing visits for home care and med teaching once a week x3-4 week, please return call to WadleyPatty at [phone]

## 2017-08-30 NOTE — Telephone Encounter (Signed)
Will discuss at office follow-up visit post hospital

## 2017-08-31 NOTE — Telephone Encounter (Signed)
Called [facility] verbal orders for nursing visits for home care and med teaching once a week x3-4 week were given via confidential voice message per Dr Kirtland BouchardK.

## 2017-09-01 ENCOUNTER — Ambulatory Visit: Payer: Self-pay

## 2017-09-01 NOTE — Telephone Encounter (Signed)
Rec'd phone call from pt's. HH RN. Reported BP of 178/80 at 12:00 PM, and 174/78 @ 12:15 PM.  Reported the pt. was discharged from hosp. on 08/24/17, after hospitalization for confusion, weakness, and hyponatremia; reported that her Lisinopril had been d/c'd.  Stated she is currently not on any BP medication.  Reported pt. C/o headache on left side of head; rated at 2/10.  Denied pt. c/o blurred vision, chest pain, generalized weakness,  unilateral weakness of extremities, confusion, or difficulty with speech.  HH RN reported BP readings taken last week of 162/68, sitting, and 140/68, standing on 08/26/17.  Pt. has hosp. F/u appt. 09/03/17.  Phone call to flow coordinator at [facility].  Advised of BP, c/o headache, and recent d/c from hospital. Advised for pt. to keep hosp. f/u appt. on 09/03/17, but to call back if BP continues to increase, or increased / worsening symptoms.  Advised HH RN and pt. Of the recommendation per the Flow Coordinator. Verb. understanding, and agrees with plan.           Reason for Disposition . Ran out of BP medications  Answer Assessment - Initial Assessment Questions 1. BLOOD PRESSURE: "What is the blood pressure?" "Did you take at least two measurements 5 minutes apart?"     178/80 @ approx. 12:00 PM and 174/78 at 12:15 PM  2. ONSET: "When did you take your blood pressure?"     Now at pt's home 3. HOW: "How did you obtain the blood pressure?" (e.g., visiting nurse, automatic home BP monitor)     Manual 4. HISTORY: "Do you have a history of high blood pressure?"     No  5. MEDICATIONS: "Are you taking any medications for blood pressure?" "Have you missed any doses recently?"     Not taking any BP medications at this time.  6. OTHER SYMPTOMS: "Do you have any symptoms?" (e.g., headache, chest pain, blurred vision, difficulty breathing, weakness)     Headache left side @ 2/10 ; HH RN denied pt. C/o chest pain, blurred vision, gen. weakness, unilateral weakness of  extremities, confusion, or difficulty with speech.  7. PREGNANCY: "Is there any chance you are pregnant?" "When was your last menstrual period?"    No  Protocols used: HIGH BLOOD PRESSURE-A-AH

## 2017-09-03 ENCOUNTER — Other Ambulatory Visit: Payer: PPO

## 2017-09-03 ENCOUNTER — Ambulatory Visit (INDEPENDENT_AMBULATORY_CARE_PROVIDER_SITE_OTHER): Payer: PPO | Admitting: Internal Medicine

## 2017-09-03 ENCOUNTER — Other Ambulatory Visit: Payer: Self-pay | Admitting: Internal Medicine

## 2017-09-03 ENCOUNTER — Encounter: Payer: Self-pay | Admitting: Internal Medicine

## 2017-09-03 VITALS — BP 140/80 | HR 60 | Temp 97.7°F | Ht 63.0 in | Wt 92.4 lb

## 2017-09-03 DIAGNOSIS — D508 Other iron deficiency anemias: Secondary | ICD-10-CM | POA: Diagnosis not present

## 2017-09-03 DIAGNOSIS — E440 Moderate protein-calorie malnutrition: Secondary | ICD-10-CM | POA: Diagnosis not present

## 2017-09-03 DIAGNOSIS — F1020 Alcohol dependence, uncomplicated: Secondary | ICD-10-CM | POA: Diagnosis not present

## 2017-09-03 DIAGNOSIS — I10 Essential (primary) hypertension: Secondary | ICD-10-CM

## 2017-09-03 DIAGNOSIS — K912 Postsurgical malabsorption, not elsewhere classified: Secondary | ICD-10-CM

## 2017-09-03 DIAGNOSIS — D729 Disorder of white blood cells, unspecified: Secondary | ICD-10-CM

## 2017-09-03 LAB — CBC WITH DIFFERENTIAL/PLATELET
Basophils Absolute: 0 10*3/uL (ref 0.0–0.1)
Basophils Relative: 1.1 % (ref 0.0–3.0)
EOS PCT: 0.7 % (ref 0.0–5.0)
Eosinophils Absolute: 0 10*3/uL (ref 0.0–0.7)
HEMATOCRIT: 31.9 % — AB (ref 36.0–46.0)
Hemoglobin: 9.9 g/dL — ABNORMAL LOW (ref 12.0–15.0)
LYMPHS ABS: 1.5 10*3/uL (ref 0.7–4.0)
Lymphocytes Relative: 35.2 % (ref 12.0–46.0)
MCHC: 31.2 g/dL (ref 30.0–36.0)
MCV: 82.5 fl (ref 78.0–100.0)
MONOS PCT: 8.6 % (ref 3.0–12.0)
Monocytes Absolute: 0.4 10*3/uL (ref 0.1–1.0)
NEUTROS ABS: 2.3 10*3/uL (ref 1.4–7.7)
Neutrophils Relative %: 54.4 % (ref 43.0–77.0)
PLATELETS: 566 10*3/uL — AB (ref 150.0–400.0)
RBC: 3.86 Mil/uL — ABNORMAL LOW (ref 3.87–5.11)
RDW: 29.1 % — ABNORMAL HIGH (ref 11.5–15.5)
WBC: 4.2 10*3/uL (ref 4.0–10.5)

## 2017-09-03 LAB — COMPREHENSIVE METABOLIC PANEL
ALBUMIN: 4.1 g/dL (ref 3.5–5.2)
ALT: 14 U/L (ref 0–35)
AST: 19 U/L (ref 0–37)
Alkaline Phosphatase: 104 U/L (ref 39–117)
BUN: 17 mg/dL (ref 6–23)
CALCIUM: 9 mg/dL (ref 8.4–10.5)
CO2: 29 meq/L (ref 19–32)
Chloride: 97 mEq/L (ref 96–112)
Creatinine, Ser: 0.49 mg/dL (ref 0.40–1.20)
GFR: 134.5 mL/min (ref >60.00)
GLUCOSE: 95 mg/dL (ref 70–99)
Potassium: 4.4 mEq/L (ref 3.5–5.1)
SODIUM: 133 meq/L — AB (ref 135–145)
Total Bilirubin: 0.3 mg/dL (ref 0.2–1.2)
Total Protein: 6.4 g/dL (ref 6.0–8.3)

## 2017-09-03 LAB — FERRITIN: Ferritin: 92.9 ng/mL (ref 10.0–291.0)

## 2017-09-03 LAB — AMMONIA: Ammonia: 46 umol/L — ABNORMAL HIGH (ref 11–35)

## 2017-09-03 MED ORDER — ALPRAZOLAM 0.5 MG PO TABS: 0.5000 mg | ORAL_TABLET | Freq: Every evening | ORAL | 0 refills | Status: DC | PRN

## 2017-09-03 MED ORDER — GABAPENTIN 100 MG PO CAPS: 100.0000 mg | ORAL_CAPSULE | Freq: Two times a day (BID) | ORAL | 0 refills | Status: DC

## 2017-09-03 MED ORDER — VITAMIN B-1 50 MG PO TABS: 50.0000 mg | ORAL_TABLET | Freq: Every day | ORAL | 6 refills | Status: DC

## 2017-09-03 NOTE — Progress Notes (Signed)
Subjective:    Patient ID: [name], female    DOB: [birth year], 67 y.o.   MRN: [id number]  HPI  67 year old patient who is seen today following a hospital discharge 10 days ago She was admitted after multiple falls in the setting of chronic alcohol use chronic anemia and hyponatremia  Sodium level was 127.  She was discharged on oral salt substitute.  Hypokalemia was also treated.  Due to mental status changes she was seen in consultation by psychiatry.  She was placed on Neurontin CT scan showed stable brain atrophy and chronic small vessel disease.  Left frontal lobe encephalomalacia also noted  Due to a minimally elevated ammonia levels she was discharged on lactulose.  She has had some diarrhea issues  She continues to use alprazolam but generally once daily at night.  This is administered by her husband  She has a history of essential hypertension and lisinopril has been on hold  During the hospital.  She received IV iron due to chronic iron deficiency anemia.  She remains on p.o. Supplements  Past Medical History:  Diagnosis Date  . Alcoholism (HCC) 02/02/2009  . ANXIETY 01/21/2007  . Blood transfusion    hx of 2005   . DIVERTICULOSIS 07/09/2007  . Duodenal ulcer hemorrhage perforated 2006  . Headache(784.0) 05/31/2007   pt denies at 08/12/11 preop visit   . HYPERTENSION 06/23/2007  . HYPONATREMIA 02/02/2009   better off diuretic  . INSOMNIA 05/31/2007  . Iron deficiency anemia    pt denies at 08/12/11 visit   . NSAID-induced duodenal ulcer   . OSTEOPOROSIS 01/21/2007  . Raynaud's syndrome 06/23/2007  . Recurrent upper respiratory infection (URI)    pt reports slight cold using Nyquil prn at bedtime   . VITAMIN B12 DEFICIENCY 10/02/2009  . Zoster 2012   twice - left groin/vagina     Social History   Socioeconomic History  . Marital status: Married    Spouse name: Not on file  . Number of children: 1  . Years of education: Not on file  . Highest education level:  Not on file  Social Needs  . Financial resource strain: Not on file  . Food insecurity - worry: Not on file  . Food insecurity - inability: Not on file  . Transportation needs - medical: Not on file  . Transportation needs - non-medical: Not on file  Occupational History  . Occupation: retired    Associate Professor: UNEMPLOYED  Tobacco Use  . Smoking status: Never Smoker  . Smokeless tobacco: Never Used  Substance and Sexual Activity  . Alcohol use: Yes    Alcohol/week: 4.2 oz    Types: 7 Cans of beer per week    Comment: 12 oz  . Drug use: No  . Sexual activity: Not on file  Other Topics Concern  . Not on file  Social History Narrative  . Not on file    Past Surgical History:  Procedure Laterality Date  . COLONOSCOPY  06/18/2007   angulated and stenotic sigmoid colon with severe sigmoid diverticulosis  . EXPLORATORY LAPAROTOMY  08/03/2004   1) duodenal ulcer oversew, pyloroplasty, anterior, posterior and truncal vagotomies  . EXPLORATORY LAPAROTOMY  10/20/2004   1) duodenal ulcer oversew  with exclusion2) side-side gastrojejunostomy 3) feeding jejunostomy  . LAPAROSCOPY  08/14/2011   Procedure: LAPAROSCOPY DIAGNOSTIC;  Surgeon: Kandis Cocking, MD;  Location: [facility];  Service: General;  Laterality: N/A;  . LAPAROTOMY  08/14/2011   Procedure: EXPLORATORY LAPAROTOMY;  Surgeon: Kandis Cocking, MD;  Location: [facility];  Service: General;  Laterality: N/A;  exploratory laparotomy, lysis of adhesions, revision gastrojejunostomy, upper endoscopy  . OPEN REDUCTION INTERNAL FIXATION (ORIF) DISTAL RADIAL FRACTURE Right 05/01/2016   Procedure: OPEN REDUCTION INTERNAL FIXATION (ORIF) DISTAL RADIAL FRACTURE, right;  Surgeon: Betha Loa, MD;  Location: [facility];  Service: Orthopedics;  Laterality: Right;  . TUBAL LIGATION  1977  . UPPER GASTROINTESTINAL ENDOSCOPY  09/16/2007; 12/21/2008; 03/17/2011   2009: Normal 2010: Normal with enteroscopy2012: Gastrojejunal  anastomotic ulcer, gastric  retention    Family History  Problem Relation Age of Onset  . Stroke Father     Allergies  Allergen Reactions  . Sulfa Antibiotics Anaphylaxis and Rash  . Sulfonamide Derivatives Anaphylaxis and Rash  . Penicillins Other (See Comments)    Has patient had a PCN reaction causing immediate rash, facial/tongue/throat swelling, SOB or lightheadedness with hypotension: No Has patient had a PCN reaction causing severe rash involving mucus membranes or skin necrosis: No Has patient had a PCN reaction that required hospitalization No Has patient had a PCN reaction occurring within the last 10 years: No If all of the above answers are "NO", then may proceed with Cephalosporin use.  Marland Kitchen Hydromorphone Hcl Rash    Current Outpatient Medications on File Prior to Visit  Medication Sig Dispense Refill  . acetaminophen (TYLENOL) 500 MG tablet Take 500 mg by mouth every 6 (six) hours as needed for mild pain.     Marland Kitchen ALPRAZolam (XANAX) 0.5 MG tablet TAKE 1 TABLET BY MOUTH 3 TIMES A DAY AS NEEDED FOR ANXIETY 60 tablet 0  . Ca Carbonate-Mag Hydroxide (ROLAIDS PO) Take 2 tablets by mouth daily as needed (indigestion).    . ferrous sulfate 325 (65 FE) MG tablet Take 1 tablet (325 mg total) by mouth 2 (two) times daily with a meal. 60 tablet 0  . folic acid (FOLVITE) 1 MG tablet Take 1 tablet (1 mg total) by mouth daily. 30 tablet 0  . gabapentin (NEURONTIN) 100 MG capsule Take 2 capsules (200 mg total) by mouth 2 (two) times daily. 60 capsule 0  . lactulose (CHRONULAC) 10 GM/15ML solution Take 30 mLs (20 g total) by mouth 2 (two) times daily as needed for mild constipation. 240 mL 0  . Multiple Vitamin (MULTIVITAMIN WITH MINERALS) TABS tablet Take 1 tablet by mouth daily. 30 tablet 0  . sodium chloride 1 g tablet Take 2 tablets (2 g total) by mouth 2 (two) times daily with a meal. 30 tablet 0  . lisinopril (PRINIVIL,ZESTRIL) 20 MG tablet      No current facility-administered medications on file prior to  visit.     BP 140/80 (BP Location: Left Arm, Patient Position: Sitting, Cuff Size: Normal)   Pulse 60   Temp 97.7 F (36.5 C) (Oral)   Ht 5\' 3"  (1.6 m)   Wt 92 lb 6.4 oz (41.9 kg)   BMI 16.37 kg/m     Review of Systems  Constitutional: Positive for fatigue.  HENT: Negative for congestion, dental problem, hearing loss, rhinorrhea, sinus pressure, sore throat and tinnitus.   Eyes: Negative for pain, discharge and visual disturbance.  Respiratory: Negative for cough and shortness of breath.   Cardiovascular: Negative for chest pain, palpitations and leg swelling.  Gastrointestinal: Negative for abdominal distention, abdominal pain, blood in stool, constipation, diarrhea, nausea and vomiting.  Genitourinary: Negative for difficulty urinating, dysuria, flank pain, frequency, hematuria, pelvic pain, urgency, vaginal bleeding, vaginal discharge  and vaginal pain.  Musculoskeletal: Negative for arthralgias, gait problem and joint swelling.  Skin: Negative for rash.  Neurological: Positive for weakness. Negative for dizziness, syncope, speech difficulty, numbness and headaches.  Hematological: Negative for adenopathy.  Psychiatric/Behavioral: Positive for confusion. Negative for agitation, behavioral problems and dysphoric mood. The patient is not nervous/anxious.        Objective:   Physical Exam  Constitutional: She is oriented to person, place, and time. She appears well-developed and well-nourished.  Thin and appears to be chronically malnourished.  Blood pressure 140/64 Pulse 60  Weight 92.4  Wt Readings from Last 3 Encounters: 09/03/17 : 92 lb 6.4 oz (41.9 kg) 08/20/17 : 93 lb 1.6 oz (42.2 kg) 08/19/17 : 93 lb 1.6 oz (42.2 kg)   HENT:  Head: Normocephalic.  Right Ear: External ear normal.  Left Ear: External ear normal.  Mouth/Throat: Oropharynx is clear and moist.  Eyes: Conjunctivae and EOM are normal. Pupils are equal, round, and reactive to light.  Anicteric  Neck:  Normal range of motion. Neck supple. No thyromegaly present.  Cardiovascular: Normal rate, regular rhythm, normal heart sounds and intact distal pulses.  Pulmonary/Chest: Effort normal and breath sounds normal.  Abdominal: Soft. Bowel sounds are normal. She exhibits no mass. There is no tenderness.  Musculoskeletal: Normal range of motion. She exhibits no edema.  Lymphadenopathy:    She has no cervical adenopathy.  Neurological: She is alert and oriented to person, place, and time.  Skin: Skin is warm and dry. No rash noted.  Mild confusion  Psychiatric: She has a normal mood and affect. Her behavior is normal.          Assessment & Plan:  History of hypertension.  Blood pressure normal today we will continue to observe blood pressure and hold antihypertensive therapy at this time. Avoid diuretics in view of history of hyponatremia History of hyponatremia.  Will check electrolytes Alcohol abuse.  Total abstinence discussed Malnutrition.  Continue multivitamins.  Add extra thiamine 50 mg daily History of iron deficiency anemia continue iron supplement  Follow-up 6 weeks Discontinue lactulose Discontinue p.o. sodium supplement Attempt to minimize alprazolam  Rogelia BogaKWIATKOWSKI,PETER FRANK

## 2017-09-03 NOTE — Patient Instructions (Signed)
Discontinue sodium chloride supplements Discontinue lactulose  Attempt to minimize use of alprazolam  Thiamine 50 mg daily  Continue iron and multivitamins  Return in 6 weeks for follow-up

## 2017-09-04 LAB — PATHOLOGIST SMEAR REVIEW

## 2017-09-08 ENCOUNTER — Ambulatory Visit: Payer: PPO | Admitting: Internal Medicine

## 2017-09-08 ENCOUNTER — Telehealth: Payer: Self-pay | Admitting: Internal Medicine

## 2017-09-08 NOTE — Telephone Encounter (Signed)
Copied from CRM [id number]. Topic: General - Other >> Sep 08, 2017 11:54 AM Gerrianne ScalePayne, Raye Wiens L wrote: Reason for CRM: patient husband Leonette MostCharles calling for lab results

## 2017-09-08 NOTE — Telephone Encounter (Signed)
Copied from CRM [phone]. Topic: General - Other >> Sep 08, 2017 12:31 PM Lelon FrohlichGolden, Amoree Newlon, [place] wrote: Reason for CRM: 1 additional nursing visit requested in 1 week please call [facility] from [facility] [phone]

## 2017-09-09 NOTE — Telephone Encounter (Signed)
Spoke with Pattie and verbalized the 1 additional nursing visit.

## 2017-09-09 NOTE — Telephone Encounter (Signed)
Okay for additional nursing

## 2017-09-09 NOTE — Telephone Encounter (Signed)
Left message for Pattie to return phone call.

## 2017-09-09 NOTE — Telephone Encounter (Signed)
Please advise on lab results.

## 2017-09-10 ENCOUNTER — Telehealth: Payer: Self-pay | Admitting: Internal Medicine

## 2017-09-10 NOTE — Telephone Encounter (Signed)
Called patient's spouse and left message to return call.

## 2017-09-10 NOTE — Telephone Encounter (Signed)
Notify patient that laboratory studies are all stable.  Anemia much improved

## 2017-09-10 NOTE — Telephone Encounter (Signed)
Pt's husband Leonette MostCharles given results per notes of Dr. Lesia HausenKwiatowski on 09/08/17, he verbalized understanding. Unable to document in result note due to result [facility] not being [facility]. He says he has 2 questions to ask Dr. Lesia HausenKwiatowski: 1) the hospital doctor prescribed Ferrous Sulfate 325 mg bid for her hemoglobin. The refills are about to run out. Does he want her to continue taking it and if so, she will need a prescriptions sent to [facility];  2) the thiamin 50 mg that he prescribed, the pharmacy said it is not available. The manufacturer quit making it, so they can't get it. I've looked in some stores and don't see it on the shelf. Should I call around to see if any other pharmacy has it or is there an alternative medicine she can take? I advised to call around and this would be sent to the provider. He asks for someone to call him back to let him know about these 2 things.

## 2017-09-11 NOTE — Telephone Encounter (Signed)
See 09/10/2017 phone encounter. Pt notified of results/instructions and verbalized understanding by [facility].

## 2017-09-12 NOTE — Telephone Encounter (Signed)
Okay to take a multivitamin and discontinue thiamine order Continue iron sulfate 1 tablet twice daily.  This medicine is available OTC

## 2017-09-15 NOTE — Telephone Encounter (Signed)
Has patient received earlier message??

## 2017-09-16 NOTE — Telephone Encounter (Signed)
[place] notified of results/instructions and verbalized understanding.

## 2017-10-04 ENCOUNTER — Other Ambulatory Visit: Payer: Self-pay | Admitting: Internal Medicine

## 2017-10-08 ENCOUNTER — Other Ambulatory Visit: Payer: Self-pay

## 2017-10-08 MED ORDER — GABAPENTIN 100 MG PO CAPS: 100.0000 mg | ORAL_CAPSULE | Freq: Two times a day (BID) | ORAL | 0 refills | Status: DC

## 2017-10-08 NOTE — Telephone Encounter (Signed)
Gabapentin accidentally printed. Discarding prescription now.

## 2017-10-12 ENCOUNTER — Telehealth: Payer: Self-pay

## 2017-10-12 NOTE — Telephone Encounter (Signed)
Left message for patient husband Leonette MostCharles regarding medication. Pharmacy stated that they received the fax however it was denied due to the patient receiving the medication from another source.

## 2017-10-13 ENCOUNTER — Telehealth: Payer: Self-pay | Admitting: Family Medicine

## 2017-10-13 NOTE — Telephone Encounter (Signed)
Spoke to [facility] and he stated that the medication was not filled by another source neither did anyone else prescribed it. Leonette MostCharles is calling the pharmacy to see what is going on. Will call us back with information.

## 2017-10-13 NOTE — Telephone Encounter (Signed)
Copied from CRM [phone]. Topic: Quick Communication - Office Called Patient >> Oct 12, 2017  4:46 PM Sherrin DaisyJohnson, Kendra R, CMA wrote: Reason for CRM: Called Patient husband Leonette Mostcharles to inform him that the medication was denied due to the prescription being printed and filled by another source recently. Medication cannot be refilled until 11/03/2017.  >> Oct 13, 2017 12:46 PM Gerrianne ScalePayne, Angela L wrote: Pt husband Leonette MostCharles calling back wanting to know which medicine that want be filled because they didn't leave the name of the medicine and pt husband states that its been over a month on the Gabapentin and the Xanax since they have been filled

## 2017-10-20 ENCOUNTER — Encounter: Payer: Self-pay | Admitting: Internal Medicine

## 2017-10-20 ENCOUNTER — Ambulatory Visit (INDEPENDENT_AMBULATORY_CARE_PROVIDER_SITE_OTHER): Payer: PPO | Admitting: Internal Medicine

## 2017-10-20 VITALS — BP 130/60 | Wt 91.0 lb

## 2017-10-20 DIAGNOSIS — K912 Postsurgical malabsorption, not elsewhere classified: Secondary | ICD-10-CM | POA: Diagnosis not present

## 2017-10-20 DIAGNOSIS — I10 Essential (primary) hypertension: Secondary | ICD-10-CM | POA: Diagnosis not present

## 2017-10-20 DIAGNOSIS — E871 Hypo-osmolality and hyponatremia: Secondary | ICD-10-CM | POA: Diagnosis not present

## 2017-10-20 DIAGNOSIS — K90829 Short bowel syndrome, unspecified: Secondary | ICD-10-CM

## 2017-10-20 DIAGNOSIS — D509 Iron deficiency anemia, unspecified: Secondary | ICD-10-CM | POA: Diagnosis not present

## 2017-10-20 DIAGNOSIS — E538 Deficiency of other specified B group vitamins: Secondary | ICD-10-CM | POA: Diagnosis not present

## 2017-10-20 DIAGNOSIS — F1020 Alcohol dependence, uncomplicated: Secondary | ICD-10-CM | POA: Diagnosis not present

## 2017-10-20 DIAGNOSIS — E440 Moderate protein-calorie malnutrition: Secondary | ICD-10-CM

## 2017-10-20 LAB — CBC WITH DIFFERENTIAL/PLATELET
BASOS PCT: 0.8 % (ref 0.0–3.0)
EOS PCT: 0.2 % (ref 0.0–5.0)
HEMATOCRIT: 36.8 % (ref 36.0–46.0)
HEMOGLOBIN: 12.1 g/dL (ref 12.0–15.0)
Lymphocytes Relative: 29.6 % (ref 12.0–46.0)
MCHC: 33 g/dL (ref 30.0–36.0)
MCV: 90.9 fl (ref 78.0–100.0)
Monocytes Relative: 8.3 % (ref 3.0–12.0)
Neutrophils Relative %: 61.1 % (ref 43.0–77.0)
PLATELETS: 356 10*3/uL (ref 150.0–400.0)
RBC: 4.05 Mil/uL (ref 3.87–5.11)
RDW: 29.7 % — ABNORMAL HIGH (ref 11.5–15.5)
WBC: 4.6 10*3/uL (ref 4.0–10.5)

## 2017-10-20 LAB — BASIC METABOLIC PANEL
BUN: 12 mg/dL (ref 6–23)
CHLORIDE: 91 meq/L — AB (ref 96–112)
CO2: 32 mEq/L (ref 19–32)
Calcium: 9.2 mg/dL (ref 8.4–10.5)
Creatinine, Ser: 0.36 mg/dL — ABNORMAL LOW (ref 0.40–1.20)
GFR: 191.89 mL/min (ref >60.00)
Glucose, Bld: 91 mg/dL (ref 70–99)
POTASSIUM: 4.3 meq/L (ref 3.5–5.1)
Sodium: 130 mEq/L — ABNORMAL LOW (ref 135–145)

## 2017-10-20 LAB — VITAMIN B12: VITAMIN B 12: 536 pg/mL (ref 211–911)

## 2017-10-20 MED ORDER — VITAMIN B-12 1000 MCG PO TABS: 1000.0000 ug | ORAL_TABLET | Freq: Every day | ORAL | Status: DC

## 2017-10-20 NOTE — Progress Notes (Signed)
Subjective:    Patient ID: [name], female    DOB: [birth year], 67 y.o.   MRN: [id number]  HPI  Wt Readings from Last 3 Encounters:  10/20/17 91 lb (41.3 kg)  09/03/17 92 lb 6.4 oz (41.9 kg)  08/20/17 93 lb 1.6 oz (34.26 kg)   67 year old patient who is seen today in follow-up.  She was hospitalized in February with hyponatremia malnutrition and iron deficiency anemia secondary to malabsorption.  She has had a prior gastrojejunostomy.  Her weight has been stable. Doing reasonably well.  She has been on Neurontin prescribed by psychiatry during her inpatient stay.  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 Ferritin level was quite low and ammonia level was elevated.  She presented with hyponatremia progressive weakness and a fall  Past Medical History:  Diagnosis Date  . Alcoholism (HCC) 02/02/2009  . ANXIETY 01/21/2007  . Blood transfusion    hx of 2005   . DIVERTICULOSIS 07/09/2007  . Duodenal ulcer hemorrhage perforated 2006  . Headache(784.0) 05/31/2007   pt denies at 08/12/11 preop visit   . HYPERTENSION 06/23/2007  . HYPONATREMIA 02/02/2009   better off diuretic  . INSOMNIA 05/31/2007  . Iron deficiency anemia    pt denies at 08/12/11 visit   . NSAID-induced duodenal ulcer   . OSTEOPOROSIS 01/21/2007  . Raynaud's syndrome 06/23/2007  . Recurrent upper respiratory infection (URI)    pt reports slight cold using Nyquil prn at bedtime   . VITAMIN B12 DEFICIENCY 10/02/2009  . Zoster 2012   twice - left groin/vagina     Social History   Socioeconomic History  . Marital status: Married    Spouse name: Not on file  . Number of children: 1  . Years of education: Not on file  . Highest education level: Not on file  Occupational History  . Occupation: retired    Associate Professor: UNEMPLOYED  Social Needs  . Financial resource strain: Not on file  . Food insecurity:    Worry: Not on file    Inability: Not on file  . Transportation needs:     Medical: Not on file    Non-medical: Not on file  Tobacco Use  . Smoking status: Never Smoker  . Smokeless tobacco: Never Used  Substance and Sexual Activity  . Alcohol use: Yes    Alcohol/week: 4.2 oz    Types: 7 Cans of beer per week    Comment: 12 oz  . Drug use: No  . Sexual activity: Not on file  Lifestyle  . Physical activity:    Days per week: Not on file    Minutes per session: Not on file  . Stress: Not on file  Relationships  . Social connections:    Talks on phone: Not on file    Gets together: Not on file    Attends religious service: Not on file    Active member of club or organization: Not on file    Attends meetings of clubs or organizations: Not on file    Relationship status: Not on file  . Intimate partner violence:    Fear of current or ex partner: Not on file    Emotionally abused: Not on file    Physically abused: Not on file    Forced sexual activity: Not on file  Other Topics Concern  . Not on file  Social History Narrative  . Not on file    Past Surgical History:  Procedure Laterality Date  . COLONOSCOPY  06/18/2007   angulated and stenotic sigmoid colon with severe sigmoid diverticulosis  . EXPLORATORY LAPAROTOMY  08/03/2004   1) duodenal ulcer oversew, pyloroplasty, anterior, posterior and truncal vagotomies  . EXPLORATORY LAPAROTOMY  10/20/2004   1) duodenal ulcer oversew  with exclusion2) side-side gastrojejunostomy 3) feeding jejunostomy  . LAPAROSCOPY  08/14/2011   Procedure: LAPAROSCOPY DIAGNOSTIC;  Surgeon: Kandis Cocking, MD;  Location: [facility];  Service: General;  Laterality: N/A;  . LAPAROTOMY  08/14/2011   Procedure: EXPLORATORY LAPAROTOMY;  Surgeon: Kandis Cocking, MD;  Location: [facility];  Service: General;  Laterality: N/A;  exploratory laparotomy, lysis of adhesions, revision gastrojejunostomy, upper endoscopy  . OPEN REDUCTION INTERNAL FIXATION (ORIF) DISTAL RADIAL FRACTURE Right 05/01/2016   Procedure: OPEN REDUCTION INTERNAL  FIXATION (ORIF) DISTAL RADIAL FRACTURE, right;  Surgeon: Betha Loa, MD;  Location: [facility];  Service: Orthopedics;  Laterality: Right;  . TUBAL LIGATION  1977  . UPPER GASTROINTESTINAL ENDOSCOPY  09/16/2007; 12/21/2008; 03/17/2011   2009: Normal 2010: Normal with enteroscopy2012: Gastrojejunal  anastomotic ulcer, gastric retention    Family History  Problem Relation Age of Onset  . Stroke Father     Allergies  Allergen Reactions  . Sulfa Antibiotics Anaphylaxis and Rash  . Sulfonamide Derivatives Anaphylaxis and Rash  . Penicillins Other (See Comments)    Has patient had a PCN reaction causing immediate rash, facial/tongue/throat swelling, SOB or lightheadedness with hypotension: No Has patient had a PCN reaction causing severe rash involving mucus membranes or skin necrosis: No Has patient had a PCN reaction that required hospitalization No Has patient had a PCN reaction occurring within the last 10 years: No If all of the above answers are "NO", then may proceed with Cephalosporin use.  Marland Kitchen Hydromorphone Hcl Rash    Current Outpatient Medications on File Prior to Visit  Medication Sig Dispense Refill  . acetaminophen (TYLENOL) 500 MG tablet Take 500 mg by mouth every 6 (six) hours as needed for mild pain.     Marland Kitchen ALPRAZolam (XANAX) 0.5 MG tablet TAKE 1 TABLET BY MOUTH 3 TIMES A DAY AS NEEDED FOR ANXIETY 60 tablet 0  . Ca Carbonate-Mag Hydroxide (ROLAIDS PO) Take 2 tablets by mouth daily as needed (indigestion).    . ferrous sulfate 325 (65 FE) MG tablet Take 1 tablet (325 mg total) by mouth 2 (two) times daily with a meal. 60 tablet 0  . gabapentin (NEURONTIN) 100 MG capsule Take 1 capsule (100 mg total) by mouth 2 (two) times daily. 60 capsule 0  . Multiple Vitamin (MULTIVITAMIN WITH MINERALS) TABS tablet Take 1 tablet by mouth daily. 30 tablet 0  . folic acid (FOLVITE) 1 MG tablet Take 1 tablet (1 mg total) by mouth daily. (Patient not taking: Reported on 10/20/2017) 30  tablet 0  . lisinopril (PRINIVIL,ZESTRIL) 20 MG tablet     . thiamine (VITAMIN B-1) 50 MG tablet Take 1 tablet (50 mg total) by mouth daily. (Patient not taking: Reported on 10/20/2017) 90 tablet 6   No current facility-administered medications on file prior to visit.     BP 130/60 (BP Location: Right Arm, Patient Position: Sitting)   Wt 91 lb (41.3 kg)   BMI 16.12 kg/m     Review of Systems  Constitutional: Positive for activity change and fatigue.  Neurological: Positive for weakness.  Psychiatric/Behavioral: Positive for behavioral problems and confusion. The patient is nervous/anxious.        Objective:  Physical Exam  Constitutional: She is oriented to person, place, and time. She appears well-developed and well-nourished. No distress.  Weight 91 pounds Blood pressure stable  Seems alert and appropriate except for repeatedly asking the same question  Malnourished  HENT:  Head: Normocephalic.  Right Ear: External ear normal.  Left Ear: External ear normal.  Mouth/Throat: Oropharynx is clear and moist.  Eyes: Pupils are equal, round, and reactive to light. Conjunctivae and EOM are normal.  Neck: Normal range of motion. Neck supple. No thyromegaly present.  Cardiovascular: Normal rate, regular rhythm, normal heart sounds and intact distal pulses.  Pulmonary/Chest: Effort normal and breath sounds normal.  Abdominal: Soft. Bowel sounds are normal. She exhibits no mass. There is no tenderness.  Musculoskeletal: Normal range of motion.  Lymphadenopathy:    She has no cervical adenopathy.  Neurological: She is alert and oriented to person, place, and time.  Skin: Skin is warm and dry. No rash noted.  Psychiatric: She has a normal mood and affect. Her behavior is normal.          Assessment & Plan:   Malabsorption syndrome status post gastrojejunostomy.  Will continue iron and vitamin supplements.  Oral B12 added to her regimen History of hyponatremia.  Will check  electrolytes History of iron deficiency anemia improved.  Will check CBC History of alcoholism.  Total abstinence encouraged Anxiety disorder.  Continue Neurontin  Follow-up 3 months  , Homero FellersFRANK

## 2017-10-20 NOTE — Patient Instructions (Signed)
Please check your blood pressure on a regular basis.  If it is consistently greater than 150/90, please make an office appointment.  Return in 3 months for follow-up  Abstain from all alcohol products

## 2017-11-09 ENCOUNTER — Other Ambulatory Visit: Payer: Self-pay | Admitting: Internal Medicine

## 2017-12-09 ENCOUNTER — Other Ambulatory Visit: Payer: Self-pay | Admitting: Internal Medicine

## 2018-01-05 ENCOUNTER — Encounter: Payer: PPO | Admitting: Psychology

## 2018-01-09 ENCOUNTER — Other Ambulatory Visit: Payer: Self-pay | Admitting: Internal Medicine

## 2018-01-11 ENCOUNTER — Encounter

## 2018-01-11 ENCOUNTER — Encounter: Payer: PPO | Admitting: Psychology

## 2018-01-27 ENCOUNTER — Ambulatory Visit (INDEPENDENT_AMBULATORY_CARE_PROVIDER_SITE_OTHER): Payer: PPO | Admitting: Internal Medicine

## 2018-01-27 ENCOUNTER — Encounter: Payer: Self-pay | Admitting: Internal Medicine

## 2018-01-27 VITALS — BP 120/70 | HR 60 | Temp 98.1°F | Wt 94.2 lb

## 2018-01-27 DIAGNOSIS — F411 Generalized anxiety disorder: Secondary | ICD-10-CM

## 2018-01-27 DIAGNOSIS — I10 Essential (primary) hypertension: Secondary | ICD-10-CM

## 2018-01-27 DIAGNOSIS — E871 Hypo-osmolality and hyponatremia: Secondary | ICD-10-CM

## 2018-01-27 DIAGNOSIS — K912 Postsurgical malabsorption, not elsewhere classified: Secondary | ICD-10-CM

## 2018-01-27 DIAGNOSIS — D508 Other iron deficiency anemias: Secondary | ICD-10-CM | POA: Diagnosis not present

## 2018-01-27 MED ORDER — ALPRAZOLAM 0.25 MG PO TABS: 0.2500 mg | ORAL_TABLET | Freq: Two times a day (BID) | ORAL | 0 refills | Status: DC | PRN

## 2018-01-27 NOTE — Patient Instructions (Signed)
It is important that you exercise regularly, at least 20 minutes 3 to 4 times per week.  If you develop chest pain or shortness of breath seek  medical attention.  Take a calcium supplement, plus [id number] units of vitamin D  Return in 4 months for follow-up  GOOD LUCK!!!

## 2018-01-27 NOTE — Progress Notes (Signed)
Subjective:    Patient ID: [name], female    DOB: 1951/02/09, 67 y.o.   MRN: [id number]  HPI  67 year old patient with a history of hypertension and post resectional malabsorption syndrome.  4 months ago, she had iron deficiency anemia.  She has been compliant with iron supplements and 3 months ago CBC normalized.  She has a history of B12 deficiency treated with oral supplements.  B12 level 3 months ago also improved She is doing generally well.  She is accompanied by her husband today.  She is requesting an occasional anxiolytic due to insomnia.   She has a history of malnutrition.  She has enjoyed some modest weight gain.  She generally feels quite well today.  Past Medical History:  Diagnosis Date  . Alcoholism (HCC) 02/02/2009  . ANXIETY 01/21/2007  . Blood transfusion    hx of 2005   . DIVERTICULOSIS 07/09/2007  . Duodenal ulcer hemorrhage perforated 2006  . Headache(784.0) 05/31/2007   pt denies at 08/12/11 preop visit   . HYPERTENSION 06/23/2007  . HYPONATREMIA 02/02/2009   better off diuretic  . INSOMNIA 05/31/2007  . Iron deficiency anemia    pt denies at 08/12/11 visit   . NSAID-induced duodenal ulcer   . OSTEOPOROSIS 01/21/2007  . Raynaud's syndrome 06/23/2007  . Recurrent upper respiratory infection (URI)    pt reports slight cold using Nyquil prn at bedtime   . VITAMIN B12 DEFICIENCY 10/02/2009  . Zoster 2012   twice - left groin/vagina     Social History   Socioeconomic History  . Marital status: Married    Spouse name: Not on file  . Number of children: 1  . Years of education: Not on file  . Highest education level: Not on file  Occupational History  . Occupation: retired    Associate Professormployer: UNEMPLOYED  Social Needs  . Financial resource strain: Not on file  . Food insecurity:    Worry: Not on file    Inability: Not on file  . Transportation needs:    Medical: Not on file    Non-medical: Not on file  Tobacco Use  . Smoking status: Never Smoker  .  Smokeless tobacco: Never Used  Substance and Sexual Activity  . Alcohol use: Yes    Alcohol/week: 4.2 oz    Types: 7 Cans of beer per week    Comment: 12 oz  . Drug use: No  . Sexual activity: Not on file  Lifestyle  . Physical activity:    Days per week: Not on file    Minutes per session: Not on file  . Stress: Not on file  Relationships  . Social connections:    Talks on phone: Not on file    Gets together: Not on file    Attends religious service: Not on file    Active member of club or organization: Not on file    Attends meetings of clubs or organizations: Not on file    Relationship status: Not on file  . Intimate partner violence:    Fear of current or ex partner: Not on file    Emotionally abused: Not on file    Physically abused: Not on file    Forced sexual activity: Not on file  Other Topics Concern  . Not on file  Social History Narrative  . Not on file    Past Surgical History:  Procedure Laterality Date  . COLONOSCOPY  06/18/2007   angulated and stenotic sigmoid colon with  severe sigmoid diverticulosis  . EXPLORATORY LAPAROTOMY  08/03/2004   1) duodenal ulcer oversew, pyloroplasty, anterior, posterior and truncal vagotomies  . EXPLORATORY LAPAROTOMY  10/20/2004   1) duodenal ulcer oversew  with exclusion2) side-side gastrojejunostomy 3) feeding jejunostomy  . LAPAROSCOPY  08/14/2011   Procedure: LAPAROSCOPY DIAGNOSTIC;  Surgeon: Kandis Cocking, MD;  Location: [facility];  Service: General;  Laterality: N/A;  . LAPAROTOMY  08/14/2011   Procedure: EXPLORATORY LAPAROTOMY;  Surgeon: Kandis Cocking, MD;  Location: [facility];  Service: General;  Laterality: N/A;  exploratory laparotomy, lysis of adhesions, revision gastrojejunostomy, upper endoscopy  . OPEN REDUCTION INTERNAL FIXATION (ORIF) DISTAL RADIAL FRACTURE Right 05/01/2016   Procedure: OPEN REDUCTION INTERNAL FIXATION (ORIF) DISTAL RADIAL FRACTURE, right;  Surgeon: Betha Loa, MD;  Location: [facility];  Service: Orthopedics;  Laterality: Right;  . TUBAL LIGATION  1977  . UPPER GASTROINTESTINAL ENDOSCOPY  09/16/2007; 12/21/2008; 03/17/2011   2009: Normal 2010: Normal with enteroscopy2012: Gastrojejunal  anastomotic ulcer, gastric retention    Family History  Problem Relation Age of Onset  . Stroke Father     Allergies  Allergen Reactions  . Sulfa Antibiotics Anaphylaxis and Rash  . Sulfonamide Derivatives Anaphylaxis and Rash  . Penicillins Other (See Comments)    Has patient had a PCN reaction causing immediate rash, facial/tongue/throat swelling, SOB or lightheadedness with hypotension: No Has patient had a PCN reaction causing severe rash involving mucus membranes or skin necrosis: No Has patient had a PCN reaction that required hospitalization No Has patient had a PCN reaction occurring within the last 10 years: No If all of the above answers are "NO", then may proceed with Cephalosporin use.  Marland Kitchen Hydromorphone Hcl Rash    Current Outpatient Medications on File Prior to Visit  Medication Sig Dispense Refill  . acetaminophen (TYLENOL) 500 MG tablet Take 500 mg by mouth every 6 (six) hours as needed for mild pain.     . Ca Carbonate-Mag Hydroxide (ROLAIDS PO) Take 2 tablets by mouth daily as needed (indigestion).    . ferrous sulfate 325 (65 FE) MG tablet Take 1 tablet (325 mg total) by mouth 2 (two) times daily with a meal. 60 tablet 0  . folic acid (FOLVITE) 1 MG tablet Take 1 tablet (1 mg total) by mouth daily. (Patient not taking: Reported on 10/20/2017) 30 tablet 0  . gabapentin (NEURONTIN) 100 MG capsule TAKE ONE CAPSULE BY MOUTH TWICE A DAY 60 capsule 0  . lisinopril (PRINIVIL,ZESTRIL) 20 MG tablet     . Multiple Vitamin (MULTIVITAMIN WITH MINERALS) TABS tablet Take 1 tablet by mouth daily. 30 tablet 0  . thiamine (VITAMIN B-1) 50 MG tablet Take 1 tablet (50 mg total) by mouth daily. (Patient not taking: Reported on 10/20/2017) 90 tablet 6  . vitamin B-12 (CYANOCOBALAMIN)  1000 MCG tablet Take 1 tablet (1,000 mcg total) by mouth daily.     No current facility-administered medications on file prior to visit.     BP 120/70 (BP Location: Right Arm, Patient Position: Sitting, Cuff Size: Small)   Pulse 60   Temp 98.1 F (36.7 C) (Oral)   Wt 94 lb 3.2 oz (42.7 kg)   SpO2 98%   BMI 16.69 kg/m     Review of Systems  Constitutional: Negative.   HENT: Negative for congestion, dental problem, hearing loss, rhinorrhea, sinus pressure, sore throat and tinnitus.   Eyes: Negative for pain, discharge and visual disturbance.  Respiratory: Negative for cough and shortness of  breath.   Cardiovascular: Negative for chest pain, palpitations and leg swelling.  Gastrointestinal: Negative for abdominal distention, abdominal pain, blood in stool, constipation, diarrhea, nausea and vomiting.  Genitourinary: Negative for difficulty urinating, dysuria, flank pain, frequency, hematuria, pelvic pain, urgency, vaginal bleeding, vaginal discharge and vaginal pain.  Musculoskeletal: Negative for arthralgias, gait problem and joint swelling.  Skin: Negative for rash.  Neurological: Negative for dizziness, syncope, speech difficulty, weakness, numbness and headaches.  Hematological: Negative for adenopathy.  Psychiatric/Behavioral: Negative for agitation, behavioral problems and dysphoric mood. The patient is not nervous/anxious.        Objective:   Physical Exam  Constitutional: She is oriented to person, place, and time. She appears well-developed and well-nourished.  Thin;  Appears older than stated age Blood pressure 120/70 Weight 94.2  HENT:  Head: Normocephalic.  Right Ear: External ear normal.  Left Ear: External ear normal.  Mouth/Throat: Oropharynx is clear and moist.  Eyes: Pupils are equal, round, and reactive to light. Conjunctivae and EOM are normal.  Neck: Normal range of motion. Neck supple. No thyromegaly present.  Cardiovascular: Normal rate, regular rhythm,  normal heart sounds and intact distal pulses.  Pulmonary/Chest: Effort normal and breath sounds normal.  Abdominal: Soft. Bowel sounds are normal. She exhibits no mass. There is no tenderness.  Musculoskeletal: Normal range of motion. She exhibits no edema.  Lymphadenopathy:    She has no cervical adenopathy.  Neurological: She is alert and oriented to person, place, and time.  Skin: Skin is warm and dry. No rash noted.  Psychiatric: She has a normal mood and affect. Her behavior is normal.          Assessment & Plan:   Essential hypertension well-controlled Post resectional malabsorption syndrome.  Continue iron and vitamin B12 supplements.  Vitamin D supplements encouraged.  Exercise regimen encouraged Anxiety disorder/  episodic insomnia/history of alcoholism.  Patient and husband request alprazolam.  Risks and benefits of medication discussed.  Have elected to prescribe 0.25 mg and limit a dose to twice weekly  Follow-up 4 months  [employer]

## 2018-02-11 ENCOUNTER — Other Ambulatory Visit: Payer: Self-pay | Admitting: Internal Medicine

## 2018-03-13 ENCOUNTER — Other Ambulatory Visit: Payer: Self-pay | Admitting: Internal Medicine

## 2018-04-10 ENCOUNTER — Other Ambulatory Visit: Payer: Self-pay | Admitting: Internal Medicine

## 2018-05-11 ENCOUNTER — Other Ambulatory Visit: Payer: Self-pay | Admitting: Family Medicine

## 2018-10-20 ENCOUNTER — Telehealth: Payer: Self-pay

## 2018-10-20 NOTE — Telephone Encounter (Signed)
Author phoned pt. to assess interest in scheduling virtual awv and/or virtual TOC visit for now, or TOC visit in office later in the year, as pt. Does not appear to have established with new provider. No answer, author left detailed VM asking for return call if interested.

## 2019-04-04 ENCOUNTER — Encounter (HOSPITAL_COMMUNITY): Payer: Self-pay | Admitting: Emergency Medicine

## 2019-04-04 ENCOUNTER — Inpatient Hospital Stay (HOSPITAL_COMMUNITY)
Admission: EM | Admit: 2019-04-04 | Discharge: 2019-04-14 | DRG: 391 | Disposition: A | Discharge status: Home / Self Care | Payer: PPO | Attending: Internal Medicine | Admitting: Internal Medicine

## 2019-04-04 ENCOUNTER — Other Ambulatory Visit: Payer: Self-pay

## 2019-04-04 DIAGNOSIS — R109 Unspecified abdominal pain: Secondary | ICD-10-CM | POA: Diagnosis not present

## 2019-04-04 DIAGNOSIS — K5720 Diverticulitis of large intestine with perforation and abscess without bleeding: Secondary | ICD-10-CM | POA: Diagnosis present

## 2019-04-04 DIAGNOSIS — E46 Unspecified protein-calorie malnutrition: Secondary | ICD-10-CM | POA: Diagnosis present

## 2019-04-04 DIAGNOSIS — E222 Syndrome of inappropriate secretion of antidiuretic hormone: Secondary | ICD-10-CM | POA: Diagnosis not present

## 2019-04-04 DIAGNOSIS — E876 Hypokalemia: Secondary | ICD-10-CM | POA: Diagnosis present

## 2019-04-04 DIAGNOSIS — R64 Cachexia: Secondary | ICD-10-CM | POA: Diagnosis present

## 2019-04-04 DIAGNOSIS — E039 Hypothyroidism, unspecified: Secondary | ICD-10-CM | POA: Diagnosis present

## 2019-04-04 DIAGNOSIS — E860 Dehydration: Secondary | ICD-10-CM | POA: Diagnosis present

## 2019-04-04 DIAGNOSIS — K5732 Diverticulitis of large intestine without perforation or abscess without bleeding: Secondary | ICD-10-CM

## 2019-04-04 DIAGNOSIS — I700 Atherosclerosis of aorta: Secondary | ICD-10-CM | POA: Diagnosis present

## 2019-04-04 DIAGNOSIS — I6381 Other cerebral infarction due to occlusion or stenosis of small artery: Secondary | ICD-10-CM | POA: Diagnosis present

## 2019-04-04 DIAGNOSIS — D509 Iron deficiency anemia, unspecified: Secondary | ICD-10-CM | POA: Diagnosis present

## 2019-04-04 DIAGNOSIS — Z823 Family history of stroke: Secondary | ICD-10-CM

## 2019-04-04 DIAGNOSIS — Z882 Allergy status to sulfonamides status: Secondary | ICD-10-CM

## 2019-04-04 DIAGNOSIS — Z880 Allergy status to penicillin: Secondary | ICD-10-CM

## 2019-04-04 DIAGNOSIS — D649 Anemia, unspecified: Secondary | ICD-10-CM | POA: Diagnosis present

## 2019-04-04 DIAGNOSIS — Z681 Body mass index (BMI) 19 or less, adult: Secondary | ICD-10-CM

## 2019-04-04 DIAGNOSIS — Z8711 Personal history of peptic ulcer disease: Secondary | ICD-10-CM

## 2019-04-04 DIAGNOSIS — L02211 Cutaneous abscess of abdominal wall: Secondary | ICD-10-CM | POA: Diagnosis not present

## 2019-04-04 DIAGNOSIS — I7300 Raynaud's syndrome without gangrene: Secondary | ICD-10-CM | POA: Diagnosis present

## 2019-04-04 DIAGNOSIS — Z79899 Other long term (current) drug therapy: Secondary | ICD-10-CM

## 2019-04-04 DIAGNOSIS — Z20828 Contact with and (suspected) exposure to other viral communicable diseases: Secondary | ICD-10-CM | POA: Diagnosis present

## 2019-04-04 DIAGNOSIS — K651 Peritoneal abscess: Secondary | ICD-10-CM

## 2019-04-04 DIAGNOSIS — M810 Age-related osteoporosis without current pathological fracture: Secondary | ICD-10-CM | POA: Diagnosis present

## 2019-04-04 DIAGNOSIS — F419 Anxiety disorder, unspecified: Secondary | ICD-10-CM | POA: Diagnosis present

## 2019-04-04 DIAGNOSIS — G9341 Metabolic encephalopathy: Secondary | ICD-10-CM | POA: Diagnosis present

## 2019-04-04 DIAGNOSIS — F411 Generalized anxiety disorder: Secondary | ICD-10-CM | POA: Diagnosis present

## 2019-04-04 DIAGNOSIS — E43 Unspecified severe protein-calorie malnutrition: Secondary | ICD-10-CM | POA: Diagnosis present

## 2019-04-04 DIAGNOSIS — I10 Essential (primary) hypertension: Secondary | ICD-10-CM | POA: Diagnosis present

## 2019-04-04 DIAGNOSIS — R4182 Altered mental status, unspecified: Secondary | ICD-10-CM | POA: Diagnosis not present

## 2019-04-04 DIAGNOSIS — E871 Hypo-osmolality and hyponatremia: Secondary | ICD-10-CM | POA: Diagnosis not present

## 2019-04-04 DIAGNOSIS — Z03818 Encounter for observation for suspected exposure to other biological agents ruled out: Secondary | ICD-10-CM | POA: Diagnosis not present

## 2019-04-04 DIAGNOSIS — K630 Abscess of intestine: Secondary | ICD-10-CM

## 2019-04-04 DIAGNOSIS — F0390 Unspecified dementia without behavioral disturbance: Secondary | ICD-10-CM | POA: Diagnosis present

## 2019-04-04 DIAGNOSIS — K5792 Diverticulitis of intestine, part unspecified, without perforation or abscess without bleeding: Secondary | ICD-10-CM

## 2019-04-04 DIAGNOSIS — R000 Tachycardia, unspecified: Secondary | ICD-10-CM | POA: Diagnosis present

## 2019-04-04 DIAGNOSIS — Z885 Allergy status to narcotic agent status: Secondary | ICD-10-CM

## 2019-04-04 DIAGNOSIS — G9389 Other specified disorders of brain: Secondary | ICD-10-CM | POA: Diagnosis present

## 2019-04-04 DIAGNOSIS — Z8673 Personal history of transient ischemic attack (TIA), and cerebral infarction without residual deficits: Secondary | ICD-10-CM

## 2019-04-04 DIAGNOSIS — Z23 Encounter for immunization: Secondary | ICD-10-CM

## 2019-04-04 DIAGNOSIS — K5730 Diverticulosis of large intestine without perforation or abscess without bleeding: Secondary | ICD-10-CM | POA: Diagnosis not present

## 2019-04-04 DIAGNOSIS — G4700 Insomnia, unspecified: Secondary | ICD-10-CM | POA: Diagnosis present

## 2019-04-04 DIAGNOSIS — K632 Fistula of intestine: Secondary | ICD-10-CM | POA: Diagnosis present

## 2019-04-04 DIAGNOSIS — R4020 Unspecified coma: Secondary | ICD-10-CM | POA: Diagnosis not present

## 2019-04-04 DIAGNOSIS — F1020 Alcohol dependence, uncomplicated: Secondary | ICD-10-CM | POA: Diagnosis present

## 2019-04-04 DIAGNOSIS — G9340 Encephalopathy, unspecified: Secondary | ICD-10-CM | POA: Diagnosis not present

## 2019-04-04 DIAGNOSIS — Z9049 Acquired absence of other specified parts of digestive tract: Secondary | ICD-10-CM

## 2019-04-04 DIAGNOSIS — Z9884 Bariatric surgery status: Secondary | ICD-10-CM

## 2019-04-04 LAB — CBC
HCT: 42.1 % (ref 36.0–46.0)
Hemoglobin: 15.3 g/dL — ABNORMAL HIGH (ref 12.0–15.0)
MCH: 33.9 pg (ref 26.0–34.0)
MCHC: 36.3 g/dL — ABNORMAL HIGH (ref 30.0–36.0)
MCV: 93.3 fL (ref 80.0–100.0)
Platelets: 396 10*3/uL (ref 150–400)
RBC: 4.51 MIL/uL (ref 3.87–5.11)
RDW: 12.2 % (ref 11.5–15.5)
WBC: 17.3 10*3/uL — ABNORMAL HIGH (ref 4.0–10.5)
nRBC: 0 % (ref 0.0–0.2)

## 2019-04-04 LAB — COMPREHENSIVE METABOLIC PANEL
ALT: 16 U/L (ref 0–44)
AST: 19 U/L (ref 15–41)
Albumin: 3.3 g/dL — ABNORMAL LOW (ref 3.5–5.0)
Alkaline Phosphatase: 95 U/L (ref 38–126)
Anion gap: 13 (ref 5–15)
BUN: <5 mg/dL — ABNORMAL LOW (ref 8–23)
CO2: 26 mmol/L (ref 22–32)
Calcium: 9.1 mg/dL (ref 8.9–10.3)
Chloride: 88 mmol/L — ABNORMAL LOW (ref 98–111)
Creatinine, Ser: 0.63 mg/dL (ref 0.44–1.00)
GFR calc Af Amer: >60 mL/min (ref >60)
GFR calc non Af Amer: >60 mL/min (ref >60)
Glucose, Bld: 118 mg/dL — ABNORMAL HIGH (ref 70–99)
Potassium: 3.4 mmol/L — ABNORMAL LOW (ref 3.5–5.1)
Sodium: 127 mmol/L — ABNORMAL LOW (ref 135–145)
Total Bilirubin: 0.5 mg/dL (ref 0.3–1.2)
Total Protein: 6.5 g/dL (ref 6.5–8.1)

## 2019-04-04 LAB — LIPASE, BLOOD: Lipase: 17 U/L (ref 11–51)

## 2019-04-04 MED ORDER — SODIUM CHLORIDE 0.9% FLUSH: 3.0000 mL | Freq: Once | INTRAVENOUS | Status: DC

## 2019-04-04 NOTE — ED Notes (Signed)
Patient's husband Juanda Crumble [phone]) requesting to be contacted once patient is in a room.

## 2019-04-04 NOTE — ED Triage Notes (Signed)
Pt here  From home with c/o abd pain  And loss of appetite for the last couple of days ,

## 2019-04-04 NOTE — ED Notes (Signed)
Patient assisted to contact her husband per her request.

## 2019-04-05 ENCOUNTER — Other Ambulatory Visit: Payer: Self-pay

## 2019-04-05 ENCOUNTER — Emergency Department (HOSPITAL_COMMUNITY): Payer: PPO

## 2019-04-05 ENCOUNTER — Inpatient Hospital Stay (HOSPITAL_COMMUNITY): Payer: PPO

## 2019-04-05 DIAGNOSIS — E222 Syndrome of inappropriate secretion of antidiuretic hormone: Secondary | ICD-10-CM | POA: Diagnosis not present

## 2019-04-05 DIAGNOSIS — F0390 Unspecified dementia without behavioral disturbance: Secondary | ICD-10-CM | POA: Diagnosis present

## 2019-04-05 DIAGNOSIS — I6381 Other cerebral infarction due to occlusion or stenosis of small artery: Secondary | ICD-10-CM | POA: Diagnosis present

## 2019-04-05 DIAGNOSIS — G9341 Metabolic encephalopathy: Secondary | ICD-10-CM | POA: Diagnosis present

## 2019-04-05 DIAGNOSIS — E46 Unspecified protein-calorie malnutrition: Secondary | ICD-10-CM | POA: Diagnosis present

## 2019-04-05 DIAGNOSIS — R4182 Altered mental status, unspecified: Secondary | ICD-10-CM | POA: Diagnosis not present

## 2019-04-05 DIAGNOSIS — E039 Hypothyroidism, unspecified: Secondary | ICD-10-CM | POA: Diagnosis present

## 2019-04-05 DIAGNOSIS — K5720 Diverticulitis of large intestine with perforation and abscess without bleeding: Secondary | ICD-10-CM | POA: Diagnosis present

## 2019-04-05 DIAGNOSIS — K632 Fistula of intestine: Secondary | ICD-10-CM | POA: Diagnosis present

## 2019-04-05 DIAGNOSIS — E860 Dehydration: Secondary | ICD-10-CM | POA: Diagnosis present

## 2019-04-05 DIAGNOSIS — E871 Hypo-osmolality and hyponatremia: Secondary | ICD-10-CM | POA: Diagnosis not present

## 2019-04-05 DIAGNOSIS — K651 Peritoneal abscess: Secondary | ICD-10-CM | POA: Diagnosis not present

## 2019-04-05 DIAGNOSIS — I10 Essential (primary) hypertension: Secondary | ICD-10-CM | POA: Diagnosis present

## 2019-04-05 DIAGNOSIS — I700 Atherosclerosis of aorta: Secondary | ICD-10-CM | POA: Diagnosis present

## 2019-04-05 DIAGNOSIS — E43 Unspecified severe protein-calorie malnutrition: Secondary | ICD-10-CM | POA: Diagnosis present

## 2019-04-05 DIAGNOSIS — E876 Hypokalemia: Secondary | ICD-10-CM | POA: Diagnosis present

## 2019-04-05 DIAGNOSIS — K5792 Diverticulitis of intestine, part unspecified, without perforation or abscess without bleeding: Secondary | ICD-10-CM | POA: Diagnosis present

## 2019-04-05 DIAGNOSIS — F1020 Alcohol dependence, uncomplicated: Secondary | ICD-10-CM | POA: Diagnosis present

## 2019-04-05 DIAGNOSIS — I7300 Raynaud's syndrome without gangrene: Secondary | ICD-10-CM | POA: Diagnosis present

## 2019-04-05 DIAGNOSIS — M810 Age-related osteoporosis without current pathological fracture: Secondary | ICD-10-CM | POA: Diagnosis present

## 2019-04-05 DIAGNOSIS — F419 Anxiety disorder, unspecified: Secondary | ICD-10-CM | POA: Diagnosis present

## 2019-04-05 DIAGNOSIS — G4700 Insomnia, unspecified: Secondary | ICD-10-CM | POA: Diagnosis present

## 2019-04-05 DIAGNOSIS — R000 Tachycardia, unspecified: Secondary | ICD-10-CM | POA: Diagnosis present

## 2019-04-05 DIAGNOSIS — F411 Generalized anxiety disorder: Secondary | ICD-10-CM

## 2019-04-05 DIAGNOSIS — Z23 Encounter for immunization: Secondary | ICD-10-CM | POA: Diagnosis present

## 2019-04-05 DIAGNOSIS — Z20828 Contact with and (suspected) exposure to other viral communicable diseases: Secondary | ICD-10-CM | POA: Diagnosis present

## 2019-04-05 DIAGNOSIS — Z681 Body mass index (BMI) 19 or less, adult: Secondary | ICD-10-CM | POA: Diagnosis not present

## 2019-04-05 DIAGNOSIS — G9389 Other specified disorders of brain: Secondary | ICD-10-CM | POA: Diagnosis present

## 2019-04-05 DIAGNOSIS — R64 Cachexia: Secondary | ICD-10-CM | POA: Diagnosis present

## 2019-04-05 HISTORY — DX: Diverticulitis of large intestine with perforation and abscess without bleeding: K57.20

## 2019-04-05 LAB — URINALYSIS, ROUTINE W REFLEX MICROSCOPIC
Bilirubin Urine: NEGATIVE
Glucose, UA: NEGATIVE mg/dL
Ketones, ur: 5 mg/dL — AB
Nitrite: NEGATIVE
Protein, ur: 30 mg/dL — AB
Specific Gravity, Urine: 1.021 (ref 1.005–1.030)
pH: 5 (ref 5.0–8.0)

## 2019-04-05 LAB — MAGNESIUM: Magnesium: 1.8 mg/dL (ref 1.7–2.4)

## 2019-04-05 LAB — SARS CORONAVIRUS 2 BY RT PCR (HOSPITAL ORDER, PERFORMED IN ~~LOC~~ HOSPITAL LAB): SARS Coronavirus 2: NEGATIVE

## 2019-04-05 MED ORDER — MIDAZOLAM HCL 2 MG/2ML IJ SOLN
INTRAMUSCULAR | Status: AC
  Filled 2019-04-05: qty 2

## 2019-04-05 MED ORDER — HYDRALAZINE HCL 20 MG/ML IJ SOLN: 10.0000 mg | INTRAMUSCULAR | Status: DC | PRN

## 2019-04-05 MED ORDER — ACETAMINOPHEN 325 MG PO TABS
650.0000 mg | ORAL_TABLET | Freq: Once | ORAL | Status: AC
  Administered 2019-04-05: 650 mg via ORAL
  Filled 2019-04-05: qty 2

## 2019-04-05 MED ORDER — ONDANSETRON HCL 4 MG/2ML IJ SOLN
4.0000 mg | Freq: Four times a day (QID) | INTRAMUSCULAR | Status: DC | PRN
  Filled 2019-04-05: qty 2

## 2019-04-05 MED ORDER — ONDANSETRON HCL 4 MG PO TABS: 4.0000 mg | ORAL_TABLET | Freq: Four times a day (QID) | ORAL | Status: DC | PRN

## 2019-04-05 MED ORDER — FENTANYL CITRATE (PF) 100 MCG/2ML IJ SOLN
INTRAMUSCULAR | Status: AC
  Filled 2019-04-05: qty 2

## 2019-04-05 MED ORDER — METRONIDAZOLE IN NACL 5-0.79 MG/ML-% IV SOLN
500.0000 mg | Freq: Three times a day (TID) | INTRAVENOUS | Status: DC
  Administered 2019-04-06 – 2019-04-14 (×26): 500 mg via INTRAVENOUS
  Filled 2019-04-05 (×27): qty 100

## 2019-04-05 MED ORDER — ACETAMINOPHEN 325 MG PO TABS
650.0000 mg | ORAL_TABLET | Freq: Four times a day (QID) | ORAL | Status: DC | PRN
  Administered 2019-04-05 – 2019-04-14 (×26): 650 mg via ORAL
  Filled 2019-04-05 (×29): qty 2

## 2019-04-05 MED ORDER — FENTANYL CITRATE (PF) 100 MCG/2ML IJ SOLN
INTRAMUSCULAR | Status: AC | PRN
  Administered 2019-04-05 (×2): 50 ug via INTRAVENOUS

## 2019-04-05 MED ORDER — SODIUM CHLORIDE 0.9 % IV SOLN
2.0000 g | Freq: Once | INTRAVENOUS | Status: AC
  Administered 2019-04-05: 15:00:00 2 g via INTRAVENOUS
  Filled 2019-04-05: qty 20

## 2019-04-05 MED ORDER — SODIUM CHLORIDE 0.9% FLUSH: 3.0000 mL | Freq: Two times a day (BID) | INTRAVENOUS | Status: DC

## 2019-04-05 MED ORDER — SODIUM CHLORIDE 0.9 % IV SOLN
INTRAVENOUS | Status: DC
  Administered 2019-04-06: 02:00:00 via INTRAVENOUS

## 2019-04-05 MED ORDER — METRONIDAZOLE IN NACL 5-0.79 MG/ML-% IV SOLN
500.0000 mg | Freq: Once | INTRAVENOUS | Status: AC
  Administered 2019-04-05: 16:00:00 500 mg via INTRAVENOUS
  Filled 2019-04-05: qty 100

## 2019-04-05 MED ORDER — SODIUM CHLORIDE 0.9 % IV BOLUS (SEPSIS)
1000.0000 mL | Freq: Once | INTRAVENOUS | Status: AC
  Administered 2019-04-05: 22:00:00 1000 mL via INTRAVENOUS

## 2019-04-05 MED ORDER — ENOXAPARIN SODIUM 40 MG/0.4ML ~~LOC~~ SOLN
40.0000 mg | SUBCUTANEOUS | Status: DC
  Administered 2019-04-05 – 2019-04-14 (×9): 40 mg via SUBCUTANEOUS
  Filled 2019-04-05 (×9): qty 0.4

## 2019-04-05 MED ORDER — IOHEXOL 300 MG/ML  SOLN
100.0000 mL | Freq: Once | INTRAMUSCULAR | Status: AC | PRN
  Administered 2019-04-05: 14:00:00 100 mL via INTRAVENOUS

## 2019-04-05 MED ORDER — ACETAMINOPHEN 325 MG PO TABS
650.0000 mg | ORAL_TABLET | Freq: Four times a day (QID) | ORAL | Status: DC | PRN
  Administered 2019-04-05: 650 mg via ORAL
  Filled 2019-04-05: qty 2

## 2019-04-05 MED ORDER — SODIUM CHLORIDE 0.9 % IV SOLN
2.0000 g | Freq: Two times a day (BID) | INTRAVENOUS | Status: DC
  Filled 2019-04-05: qty 2

## 2019-04-05 MED ORDER — ACETAMINOPHEN 650 MG RE SUPP
650.0000 mg | Freq: Four times a day (QID) | RECTAL | Status: DC | PRN
  Administered 2019-04-11: 650 mg via RECTAL

## 2019-04-05 MED ORDER — CEPHALEXIN 250 MG PO CAPS
500.0000 mg | ORAL_CAPSULE | Freq: Once | ORAL | Status: AC
  Administered 2019-04-05: 10:00:00 500 mg via ORAL
  Filled 2019-04-05: qty 2

## 2019-04-05 MED ORDER — SODIUM CHLORIDE 0.9 % IV SOLN
2.0000 g | INTRAVENOUS | Status: DC
  Administered 2019-04-06 – 2019-04-13 (×8): 2 g via INTRAVENOUS
  Filled 2019-04-05: qty 2
  Filled 2019-04-05: qty 20
  Filled 2019-04-05 (×6): qty 2
  Filled 2019-04-05: qty 20

## 2019-04-05 MED ORDER — MIDAZOLAM HCL 2 MG/2ML IJ SOLN
INTRAMUSCULAR | Status: AC | PRN
  Administered 2019-04-05 (×2): 1 mg via INTRAVENOUS

## 2019-04-05 NOTE — ED Notes (Signed)
Pt transported to IR 

## 2019-04-05 NOTE — H&P (Addendum)
History and Physical    DENIM START PJK:[id number] DOB: [birth year] DOA: 04/04/2019  Referring MD/NP/PA:Kelly Aundra Dubin, PA-C PCP: Marletta Lor, MD  Patient coming from: Home  Chief Complaint: Abdominal pain  I have personally briefly reviewed patient's old medical records in [place]   HPI: [name] is a 68 y.o. female with medical history significant of hypertension, hypothyroidism, alcoholism, anxiety, and duodenal ulcer perforation s/p surgical repair; presents with complaints of abdominal pain over the last 3-4 days.  History is obtained from the patient's husband over the phone as the patient is confused.  He reports at baseline she has been confused for quite some time and he needs to have her formally evaluated.  Pain is located in the left lower quadrant of her abdomen.  She had reportedly gotten sick 2 weeks ago and had thrown up once. Since that time he had put her on a soft diet.  She has been doing okay, but over the last last week had stopped eating altogether.  Associated symptoms include complaints of headache.  She denies having any other complaints at this time.  Husband denies any knowledge of any blood in stool, fever, or chest pain symptoms.  He is not on blood thinners.  She has not drank alcohol in quite some time.  ED Course: Upon admission to the emergency department patient was noted to be Abrol, pulse 75-1 07, blood pressure elevated up to 180/75, O2 saturations 88 -100% on room air.  Labs revealed WBC 17.3, hemoglobin 15.3, sodium 127, potassium 3.4, chloride 88, Lipase 17, and liver enzymes within normal limits.  Urinalysis was positive for moderate leukocytes, rare bacteria, and 11-20 WBCs.  CT scan of the abdomen and pelvis reveals sigmoid diverticulitis with large diverticular abscess.  Patient was initially given 1 L normal saline IV fluids and po    Keflex, but later started on Rocephin and metronidazole IV.  General surgery was consulted and  recommended IR for possible need of drainage.   Review of Systems  Unable to perform ROS: Dementia  Otherwise a 10 point review of systems was performed and negative except for as noted above in HPI.  Past Medical History:  Diagnosis Date   Alcoholism ([place]) 02/02/2009   ANXIETY 01/21/2007   Blood transfusion    hx of 2005    DIVERTICULOSIS 07/09/2007   Duodenal ulcer hemorrhage perforated 2006   Headache(784.0) 05/31/2007   pt denies at 08/12/11 preop visit    HYPERTENSION 06/23/2007   HYPONATREMIA 02/02/2009   better off diuretic   INSOMNIA 05/31/2007   Iron deficiency anemia    pt denies at 08/12/11 visit    NSAID-induced duodenal ulcer    OSTEOPOROSIS 01/21/2007   Raynaud's syndrome 06/23/2007   Recurrent upper respiratory infection (URI)    pt reports slight cold using Nyquil prn at bedtime    VITAMIN B12 DEFICIENCY 10/02/2009   Zoster 2012   twice - left groin/vagina    Past Surgical History:  Procedure Laterality Date   COLONOSCOPY  06/18/2007   angulated and stenotic sigmoid colon with severe sigmoid diverticulosis   EXPLORATORY LAPAROTOMY  08/03/2004   1) duodenal ulcer oversew, pyloroplasty, anterior, posterior and truncal vagotomies   EXPLORATORY LAPAROTOMY  10/20/2004   1) duodenal ulcer oversew  with exclusion2) side-side gastrojejunostomy 3) feeding jejunostomy   LAPAROSCOPY  08/14/2011   Procedure: LAPAROSCOPY DIAGNOSTIC;  Surgeon: Shann Medal, MD;  Location: [facility];  Service: General;  Laterality: N/A;   LAPAROTOMY  08/14/2011   Procedure: EXPLORATORY LAPAROTOMY;  Surgeon: Kandis Cocking, MD;  Location: [facility];  Service: General;  Laterality: N/A;  exploratory laparotomy, lysis of adhesions, revision gastrojejunostomy, upper endoscopy   OPEN REDUCTION INTERNAL FIXATION (ORIF) DISTAL RADIAL FRACTURE Right 05/01/2016   Procedure: OPEN REDUCTION INTERNAL FIXATION (ORIF) DISTAL RADIAL FRACTURE, right;  Surgeon: Betha Loa, MD;  Location: [facility];  Service: Orthopedics;  Laterality: Right;   TUBAL LIGATION  1977   UPPER GASTROINTESTINAL ENDOSCOPY  09/16/2007; 12/21/2008; 03/17/2011   2009: Normal 2010: Normal with enteroscopy2012: Gastrojejunal  anastomotic ulcer, gastric retention     reports that she has never smoked. She has never used smokeless tobacco. She reports current alcohol use of about 7.0 standard drinks of alcohol per week. She reports that she does not use drugs.  Allergies  Allergen Reactions   Sulfa Antibiotics Anaphylaxis and Rash   Sulfonamide Derivatives Anaphylaxis and Rash   Penicillins Other (See Comments)    Has patient had a PCN reaction causing immediate rash, facial/tongue/throat swelling, SOB or lightheadedness with hypotension: No Has patient had a PCN reaction causing severe rash involving mucus membranes or skin necrosis: No Has patient had a PCN reaction that required hospitalization No Has patient had a PCN reaction occurring within the last 10 years: No If all of the above answers are "NO", then may proceed with Cephalosporin use.   Hydromorphone Hcl Rash    Family History  Problem Relation Age of Onset   Stroke Father     Prior to Admission medications   Medication Sig Start Date End Date Taking? Authorizing Provider  acetaminophen (TYLENOL) 500 MG tablet Take 500 mg by mouth every 6 (six) hours as needed for mild pain.     [provider]  ALPRAZolam Prudy Feeler) 0.25 MG tablet Take 1 tablet (0.25 mg total) by mouth 2 (two) times daily as needed for anxiety. 01/27/18   Gordy Savers, MD  Ca Carbonate-Mag Hydroxide (ROLAIDS PO) Take 2 tablets by mouth daily as needed (indigestion).    [provider]  ferrous sulfate 325 (65 FE) MG tablet Take 1 tablet (325 mg total) by mouth 2 (two) times daily with a meal. 08/24/17   Maxie Barb, MD  folic acid (FOLVITE) 1 MG tablet Take 1 tablet (1 mg total) by mouth daily. Patient not taking: Reported on  10/20/2017 08/25/17   Maxie Barb, MD  gabapentin (NEURONTIN) 100 MG capsule TAKE 1 CAPSULE BY MOUTH TWICE A DAY 05/11/18   Roderick Pee, MD  lisinopril (PRINIVIL,ZESTRIL) 20 MG tablet  09/03/17   [provider]  Multiple Vitamin (MULTIVITAMIN WITH MINERALS) TABS tablet Take 1 tablet by mouth daily. 08/25/17   Maxie Barb, MD  thiamine (VITAMIN B-1) 50 MG tablet Take 1 tablet (50 mg total) by mouth daily. Patient not taking: Reported on 10/20/2017 09/03/17   Gordy Savers, MD  vitamin B-12 (CYANOCOBALAMIN) 1000 MCG tablet Take 1 tablet (1,000 mcg total) by mouth daily. 10/20/17   Gordy Savers, MD    Physical Exam:  Constitutional: Elderly female currently in NAD, calm, comfortable Vitals:   04/05/19 0626 04/05/19 0845 04/05/19 0915 04/05/19 0951  BP: 140/90 (!) 174/83 (!) 171/77 (!) 180/75  Pulse: 85 69 70 73  Resp: 16   16  Temp: 98.2 F (36.8 C)     TempSrc: Oral     SpO2: 98% (!) 88% 100% 100%   Eyes: PERRL, lids and conjunctivae normal ENMT: Mucous  membranes are dry.  Posterior pharynx clear of any exudate or lesions.       Neck: normal, supple, no masses, no thyromegaly Respiratory: clear to auscultation bilaterally, no wheezing, no crackles. Normal respiratory effort. No accessory muscle use.  Cardiovascular: Regular rate and rhythm, no murmurs / rubs / gallops. No extremity edema. 2+ pedal pulses. No carotid bruits.  Abdomen: Left lower quadrant abdominal tenderness to palpation.  Bowel sounds present. Musculoskeletal: no clubbing / cyanosis. No joint deformity upper and lower extremities. Good ROM, no contractures. Normal muscle tone.  Skin: no rashes, lesions, ulcers. No induration Neurologic: CN 2-12 grossly intact. Sensation intact, DTR normal. Strength 5/5 in all 4.  Psychiatric: Alert and oriented only to person.  Patient thought year was 2006 and that she was at the motel.   Labs on Admission: I have personally reviewed following  labs and imaging studies  CBC: Recent Labs  Lab 04/04/19 1846  WBC 17.3*  HGB 15.3*  HCT 42.1  MCV 93.3  PLT 396   Basic Metabolic Panel: Recent Labs  Lab 04/04/19 1846  NA 127*  K 3.4*  CL 88*  CO2 26  GLUCOSE 118*  BUN <5*  CREATININE 0.63  CALCIUM 9.1   GFR: CrCl cannot be calculated (Unknown ideal weight.). Liver Function Tests: Recent Labs  Lab 04/04/19 1846  AST 19  ALT 16  ALKPHOS 95  BILITOT 0.5  PROT 6.5  ALBUMIN 3.3*   Recent Labs  Lab 04/04/19 1846  LIPASE 17   No results for input(s): AMMONIA in the last 168 hours. Coagulation Profile: No results for input(s): INR, PROTIME in the last 168 hours. Cardiac Enzymes: No results for input(s): CKTOTAL, CKMB, CKMBINDEX, TROPONINI in the last 168 hours. BNP (last 3 results) No results for input(s): PROBNP in the last 8760 hours. HbA1C: No results for input(s): HGBA1C in the last 72 hours. CBG: No results for input(s): GLUCAP in the last 168 hours. Lipid Profile: No results for input(s): CHOL, HDL, LDLCALC, TRIG, CHOLHDL, LDLDIRECT in the last 72 hours. Thyroid Function Tests: No results for input(s): TSH, T4TOTAL, FREET4, T3FREE, THYROIDAB in the last 72 hours. Anemia Panel: No results for input(s): VITAMINB12, FOLATE, FERRITIN, TIBC, IRON, RETICCTPCT in the last 72 hours. Urine analysis:    Component Value Date/Time   COLORURINE AMBER (A) 04/05/2019 0900   APPEARANCEUR HAZY (A) 04/05/2019 0900   LABSPEC 1.021 04/05/2019 0900   PHURINE 5.0 04/05/2019 0900   GLUCOSEU NEGATIVE 04/05/2019 0900   HGBUR SMALL (A) 04/05/2019 0900   BILIRUBINUR NEGATIVE 04/05/2019 0900   BILIRUBINUR N 08/19/2017 1604   KETONESUR 5 (A) 04/05/2019 0900   PROTEINUR 30 (A) 04/05/2019 0900   UROBILINOGEN 0.2 08/19/2017 1604   UROBILINOGEN 0.2 02/01/2014 0350   NITRITE NEGATIVE 04/05/2019 0900   LEUKOCYTESUR MODERATE (A) 04/05/2019 0900   Sepsis Labs: Recent Results (from the past 240 hour(s))  SARS Coronavirus 2  [facility](Hospital order, Performed in [facility] hospital lab) Nasopharyngeal Nasopharyngeal Swab     Status: None   Collection Time: 04/05/19  2:45 PM   Specimen: Nasopharyngeal Swab  Result Value Ref Range Status   SARS Coronavirus 2 NEGATIVE NEGATIVE Final    Comment: (NOTE) If result is NEGATIVE SARS-CoV-2 target nucleic acids are NOT DETECTED. The SARS-CoV-2 RNA is generally detectable in upper and lower  respiratory specimens during the acute phase of infection. The lowest  concentration of SARS-CoV-2 viral copies this assay can detect is 250  copies / mL. A negative result does  not preclude SARS-CoV-2 infection  and should not be used as the sole basis for treatment or other  patient management decisions.  A negative result may occur with  improper specimen collection / handling, submission of specimen other  than nasopharyngeal swab, presence of viral mutation(s) within the  areas targeted by this assay, and inadequate number of viral copies  (<250 copies / mL). A negative result must be combined with clinical  observations, patient history, and epidemiological information. If result is POSITIVE SARS-CoV-2 target nucleic acids are DETECTED. The SARS-CoV-2 RNA is generally detectable in upper and lower  respiratory specimens dur ing the acute phase of infection.  Positive  results are indicative of active infection with SARS-CoV-2.  Clinical  correlation with patient history and other diagnostic information is  necessary to determine patient infection status.  Positive results do  not rule out bacterial infection or co-infection with other viruses. If result is PRESUMPTIVE POSTIVE SARS-CoV-2 nucleic acids MAY BE PRESENT.   A presumptive positive result was obtained on the submitted specimen  and confirmed on repeat testing.  While 2019 novel coronavirus  (SARS-CoV-2) nucleic acids may be present in the submitted sample  additional confirmatory testing may be necessary for  epidemiological  and / or clinical management purposes  to differentiate between  SARS-CoV-2 and other Sarbecovirus currently known to infect humans.  If clinically indicated additional testing with an alternate test  methodology [phone]([id number]) is advised. The SARS-CoV-2 RNA is generally  detectable in upper and lower respiratory sp ecimens during the acute  phase of infection. The expected result is Negative. Fact Sheet for Patients:  BoilerBrush.com.cyhttps://www.fda.gov/media/136312/download Fact Sheet for Healthcare Providers: https://pope.com/https://www.fda.gov/media/136313/download This test is not yet approved or cleared by the [place] FDA and has been authorized for detection and/or diagnosis of SARS-CoV-2 by FDA under an Emergency Use Authorization (EUA).  This EUA will remain in effect (meaning this test can be used) for the duration of the COVID-19 declaration under Section 564(b)(1) of the Act, 21 U.S.C. section 360bbb-3(b)(1), unless the authorization is terminated or revoked sooner. Performed at [facility], [address] [place], [place] [address]      Radiological Exams on Admission: Ct Abdomen Pelvis W Contrast  Result Date: 04/05/2019 CLINICAL DATA:  68 year old female with abdominal pain. History of prior perforated duodenal ulcer and exclude Terri laparotomy and pyloroplasty. EXAM: CT ABDOMEN AND PELVIS WITH CONTRAST TECHNIQUE: Multidetector CT imaging of the abdomen and pelvis was performed using the standard protocol following bolus administration of intravenous contrast. CONTRAST:  100mL OMNIPAQUE IOHEXOL 300 MG/ML  SOLN COMPARISON:  CT of the abdomen pelvis dated 08/05/2011. FINDINGS: Evaluation of this exam is limited due to respiratory motion artifact. Lower chest: The visualized lung bases are clear. Minimal right middle lobe atelectasis/scarring. There is mild cardiomegaly. Coronary vascular calcifications noted. No definite intra-abdominal free air or free fluid. Hepatobiliary: A 1 cm  hypodense lesion in the dome of the liver is not well characterized but appears similar to prior CT, likely a cyst. The liver is otherwise unremarkable. There is mild intrahepatic biliary ductal dilatation. No calcified gallstone or pericholecystic fluid. Pancreas: Unremarkable. No pancreatic ductal dilatation or surrounding inflammatory changes. Spleen: Normal in size without focal abnormality. Adrenals/Urinary Tract: The adrenal glands are unremarkable. There is no hydronephrosis on either side. There is symmetric enhancement and excretion of contrast by both kidneys. Subcentimeter left renal hypodense focus is too small to characterize but likely represents a cyst. The urinary bladder is unremarkable. Stomach/Bowel: Extensive  sigmoid diverticulosis with active inflammatory changes. There is a 9.7 x 3.7 x 3.8 cm diverticular abscess along the sigmoid colon. Postsurgical changes of gastrojejunostomy. No evidence of bowel obstruction. A fluid containing structure with pockets of air in the upper abdomen anteriorly (series 3, image 27) appears similar to the CT of 2013 and may represent the duodenal bulb. A 15 mm high attenuating structure within this may represent a chronic stone although an enhancing lesion is not entirely excluded. Vascular/Lymphatic: Moderate aortoiliac atherosclerotic disease. The IVC is unremarkable. No portal venous gas. There is no adenopathy. Reproductive: The uterus is grossly unremarkable. There is loss of fat plane between the superior uterus and diverticular abscess of the sigmoid colon. No adnexal masses. Other: None Musculoskeletal: Osteopenia. No acute osseous pathology. IMPRESSION: 1. Sigmoid diverticulitis with a large diverticular abscess. There is loss of fat plane between the superior uterus and diverticular abscess. 2. A 15 mm high attenuating structure in the anterior upper abdomen appears similar to prior CT and may represent the duodenal bulb. Aortic Atherosclerosis  (ICD10-I70.0). Electronically Signed   By: Elgie Collard M.D.   On: 04/05/2019 14:14      Assessment/Plan Sigmoid diverticulitis with large abscess: Acute.  Patient stented with 3 days of abdominal pain.  CBC elevated at 17.3, but no initial lactic acid was obtained although patient appeared to meet SIRS criteria.  CT imaging as seen above.  Patient was started on empiric antibiotics of Rocephin and metronidazole. General surgery evaluated and recommended IR to evaluate for possible need for drainage. -Admit to a medical telemetry bed -NPO  -Continue empiric antibiotics of Rocephin and metronidazole -IV fluids of normal saline at 100 ml/hr -Recheck CBC in a.m. -Appreciate interventional radiology consultative services, we will follow-up further recommendations  Hyponatremia: Acute on chronic. Sodium 127 on admission with low chloride. Suspect hypovolemic hyponatremia given poor po intake. -IV fluids as seen above -serial monitoring of sodium levels  Suspected dementia: Patient alert and oriented only to herself.  Patient's husband makes it seems that this is not new and has been going on for quite some time. -Will need further outpatient management and testing.    Essential hypertension: Initial blood pressures elevated up to 180/75 on admission. -Restart home oral medications when medically appropriate -Hydralazine IV as needed  Anxiety -Continue Xanax as needed  Remote history of alcohol abuse: Patient has not drank alcohol in quite some time as reported by her husband.  GERD prophylaxis: Protonix DVT prophylaxis: Lovenox Code Status: Full Family Communication: discussed plan with husband over the phone Disposition Plan: Possible discharge home in 2 to 3 days Consults called: Surgery/interventional radiology Admission status: Inpatient   Clydie Braun MD [facility] Pager [phone]   If 7PM-7AM, please contact night-coverage [email or web address] Password  [facility]  04/05/2019, 4:38 PM

## 2019-04-05 NOTE — Procedures (Signed)
Interventional Radiology Procedure Note  Procedure: Placement of a 61F drain into the diverticular abscess. Aspiration yields 80 mL frank pus.    Complications: None  Estimated Blood Loss: None  Recommendations: - Drain to JP - Flush each shift  Signed,  Criselda Peaches, MD

## 2019-04-05 NOTE — Sedation Documentation (Signed)
ED Provider at bedside. 

## 2019-04-05 NOTE — H&P (Signed)
Chief Complaint: Patient was seen in consultation today for sigmoid diverticular abscess/intraabdominal drain placement.  Referring Physician(s): Sherrie GeorgeJennings, Willard (CC[facility])  Supervising Physician: Malachy MoanMcCullough, Heath  [facility]  History of Present Illness: Orabelle Malvin JohnsM Rone is a 68 y.o. female with a past medical history of headaches, diverticulosis, duodenal ulcer perforation s/p surgical repair 07/2004, iron deficiency anemia, vitamin B12 deficiency, Raynaud's syndrome, osteoporosis, insomnia, anxiety, and alcoholism. [facility]/2019 with complaint of abdominal pain. In ED, CT abdomen/pelvis revealed a large diverticular abscess. Surgery was consulted who recommended IR consultation for possible percutaneous intraabdominal drain placement.  CT abdomen/pelvis 04/05/2019: 1. Sigmoid diverticulitis with a large diverticular abscess. There is loss of fat plane between the superior uterus and diverticular abscess. 2. A 15 mm high attenuating structure in the anterior upper abdomen appears similar to prior CT and may represent the duodenal bulb. 3. Aortic Atherosclerosis (ICD10-I70.0).  IR consulted by Sherrie GeorgeWillard Jennings, PA-C for possible image-guided intraabdominal drain placement. Patient awake and alert sitting in chair. She is pleasantly confused- able to converse but constantly interrupts and unable to repeat procedure overview after it was described to her. History difficult to obtain due to confusion.   Past Medical History:  Diagnosis Date  . Alcoholism (HCC) 02/02/2009  . ANXIETY 01/21/2007  . Blood transfusion    hx of 2005   . DIVERTICULOSIS 07/09/2007  . Duodenal ulcer hemorrhage perforated 2006  . Headache(784.0) 05/31/2007   pt denies at 08/12/11 preop visit   . HYPERTENSION 06/23/2007  . HYPONATREMIA 02/02/2009   better off diuretic  . INSOMNIA 05/31/2007  . Iron deficiency anemia    pt denies at 08/12/11 visit   . NSAID-induced duodenal ulcer   .  OSTEOPOROSIS 01/21/2007  . Raynaud's syndrome 06/23/2007  . Recurrent upper respiratory infection (URI)    pt reports slight cold using Nyquil prn at bedtime   . VITAMIN B12 DEFICIENCY 10/02/2009  . Zoster 2012   twice - left groin/vagina    Past Surgical History:  Procedure Laterality Date  . COLONOSCOPY  06/18/2007   angulated and stenotic sigmoid colon with severe sigmoid diverticulosis  . EXPLORATORY LAPAROTOMY  08/03/2004   1) duodenal ulcer oversew, pyloroplasty, anterior, posterior and truncal vagotomies  . EXPLORATORY LAPAROTOMY  10/20/2004   1) duodenal ulcer oversew  with exclusion2) side-side gastrojejunostomy 3) feeding jejunostomy  . LAPAROSCOPY  08/14/2011   Procedure: LAPAROSCOPY DIAGNOSTIC;  Surgeon: Kandis Cockingavid H Newman, MD;  Location: [facility];  Service: General;  Laterality: N/A;  . LAPAROTOMY  08/14/2011   Procedure: EXPLORATORY LAPAROTOMY;  Surgeon: Kandis Cockingavid H Newman, MD;  Location: [facility];  Service: General;  Laterality: N/A;  exploratory laparotomy, lysis of adhesions, revision gastrojejunostomy, upper endoscopy  . OPEN REDUCTION INTERNAL FIXATION (ORIF) DISTAL RADIAL FRACTURE Right 05/01/2016   Procedure: OPEN REDUCTION INTERNAL FIXATION (ORIF) DISTAL RADIAL FRACTURE, right;  Surgeon: Betha LoaKevin Kuzma, MD;  Location: [facility];  Service: Orthopedics;  Laterality: Right;  . TUBAL LIGATION  1977  . UPPER GASTROINTESTINAL ENDOSCOPY  09/16/2007; 12/21/2008; 03/17/2011   2009: Normal 2010: Normal with enteroscopy2012: Gastrojejunal  anastomotic ulcer, gastric retention    Allergies: Sulfa antibiotics, Sulfonamide derivatives, Penicillins, and Hydromorphone hcl  Medications: Prior to Admission medications   Medication Sig Start Date End Date Taking? Authorizing Provider  acetaminophen (TYLENOL) 500 MG tablet Take 500 mg by mouth every 6 (six) hours as needed for mild pain.     [provider]  ALPRAZolam Prudy Feeler(XANAX) 0.25 MG tablet  Take 1 tablet (0.25 mg total) by mouth 2  (two) times daily as needed for anxiety. 01/27/18   Marletta Lor, MD  Ca Carbonate-Mag Hydroxide (ROLAIDS PO) Take 2 tablets by mouth daily as needed (indigestion).    [provider]  ferrous sulfate 325 (65 FE) MG tablet Take 1 tablet (325 mg total) by mouth 2 (two) times daily with a meal. 08/24/17   Rosita Fire, MD  folic acid (FOLVITE) 1 MG tablet Take 1 tablet (1 mg total) by mouth daily. Patient not taking: Reported on 10/20/2017 08/25/17   Rosita Fire, MD  gabapentin (NEURONTIN) 100 MG capsule TAKE 1 CAPSULE BY MOUTH TWICE A DAY 05/11/18   Dorena Cookey, MD  lisinopril (PRINIVIL,ZESTRIL) 20 MG tablet  09/03/17   [provider]  Multiple Vitamin (MULTIVITAMIN WITH MINERALS) TABS tablet Take 1 tablet by mouth daily. 08/25/17   Rosita Fire, MD  thiamine (VITAMIN B-1) 50 MG tablet Take 1 tablet (50 mg total) by mouth daily. Patient not taking: Reported on 10/20/2017 09/03/17   Marletta Lor, MD  vitamin B-12 (CYANOCOBALAMIN) 1000 MCG tablet Take 1 tablet (1,000 mcg total) by mouth daily. 10/20/17   Marletta Lor, MD     Family History  Problem Relation Age of Onset  . Stroke Father     Social History   Socioeconomic History  . Marital status: Married    Spouse name: Not on file  . Number of children: 1  . Years of education: Not on file  . Highest education level: Not on file  Occupational History  . Occupation: retired    Fish farm manager: UNEMPLOYED  Social Needs  . Financial resource strain: Not on file  . Food insecurity    Worry: Not on file    Inability: Not on file  . Transportation needs    Medical: Not on file    Non-medical: Not on file  Tobacco Use  . Smoking status: Never Smoker  . Smokeless tobacco: Never Used  Substance and Sexual Activity  . Alcohol use: Yes    Alcohol/week: 7.0 standard drinks    Types: 7 Cans of beer per week    Comment: 12 oz  . Drug use: No  . Sexual activity: Not on file   Lifestyle  . Physical activity    Days per week: Not on file    Minutes per session: Not on file  . Stress: Not on file  Relationships  . Social Herbalist on phone: Not on file    Gets together: Not on file    Attends religious service: Not on file    Active member of club or organization: Not on file    Attends meetings of clubs or organizations: Not on file    Relationship status: Not on file  Other Topics Concern  . Not on file  Social History Narrative  . Not on file     Review of Systems: A 12 point ROS discussed and pertinent positives are indicated in the HPI above.  All other systems are negative.  Review of Systems  Unable to perform ROS: Other (Confusion.)    Vital Signs: BP (!) 180/75 (BP Location: Right Arm)   Pulse 73   Temp 98.2 F (36.8 C) (Oral)   Resp 16   SpO2 100%   Physical Exam Vitals signs and nursing note reviewed.  Constitutional:      General: She is not in acute distress.  Appearance: Normal appearance.  Cardiovascular:     Rate and Rhythm: Normal rate and regular rhythm.     Heart sounds: Normal heart sounds. No murmur.  Pulmonary:     Effort: Pulmonary effort is normal. No respiratory distress.     Breath sounds: Normal breath sounds. No wheezing.  Skin:    General: Skin is warm and dry.  Neurological:     Mental Status: She is alert.      MD Evaluation Airway: WNL Heart: WNL Abdomen: WNL Chest/ Lungs: WNL ASA  Classification: 3 Mallampati/Airway Score: Two   Imaging: Ct Abdomen Pelvis W Contrast  Result Date: 04/05/2019 CLINICAL DATA:  68 year old female with abdominal pain. History of prior perforated duodenal ulcer and exclude Terri laparotomy and pyloroplasty. EXAM: CT ABDOMEN AND PELVIS WITH CONTRAST TECHNIQUE: Multidetector CT imaging of the abdomen and pelvis was performed using the standard protocol following bolus administration of intravenous contrast. CONTRAST:  100mL OMNIPAQUE IOHEXOL 300 MG/ML   SOLN COMPARISON:  CT of the abdomen pelvis dated 08/05/2011. FINDINGS: Evaluation of this exam is limited due to respiratory motion artifact. Lower chest: The visualized lung bases are clear. Minimal right middle lobe atelectasis/scarring. There is mild cardiomegaly. Coronary vascular calcifications noted. No definite intra-abdominal free air or free fluid. Hepatobiliary: A 1 cm hypodense lesion in the dome of the liver is not well characterized but appears similar to prior CT, likely a cyst. The liver is otherwise unremarkable. There is mild intrahepatic biliary ductal dilatation. No calcified gallstone or pericholecystic fluid. Pancreas: Unremarkable. No pancreatic ductal dilatation or surrounding inflammatory changes. Spleen: Normal in size without focal abnormality. Adrenals/Urinary Tract: The adrenal glands are unremarkable. There is no hydronephrosis on either side. There is symmetric enhancement and excretion of contrast by both kidneys. Subcentimeter left renal hypodense focus is too small to characterize but likely represents a cyst. The urinary bladder is unremarkable. Stomach/Bowel: Extensive sigmoid diverticulosis with active inflammatory changes. There is a 9.7 x 3.7 x 3.8 cm diverticular abscess along the sigmoid colon. Postsurgical changes of gastrojejunostomy. No evidence of bowel obstruction. A fluid containing structure with pockets of air in the upper abdomen anteriorly (series 3, image 27) appears similar to the CT of 2013 and may represent the duodenal bulb. A 15 mm high attenuating structure within this may represent a chronic stone although an enhancing lesion is not entirely excluded. Vascular/Lymphatic: Moderate aortoiliac atherosclerotic disease. The IVC is unremarkable. No portal venous gas. There is no adenopathy. Reproductive: The uterus is grossly unremarkable. There is loss of fat plane between the superior uterus and diverticular abscess of the sigmoid colon. No adnexal masses. Other:  None Musculoskeletal: Osteopenia. No acute osseous pathology. IMPRESSION: 1. Sigmoid diverticulitis with a large diverticular abscess. There is loss of fat plane between the superior uterus and diverticular abscess. 2. A 15 mm high attenuating structure in the anterior upper abdomen appears similar to prior CT and may represent the duodenal bulb. Aortic Atherosclerosis (ICD10-I70.0). Electronically Signed   By: Elgie CollardArash  Radparvar M.D.   On: 04/05/2019 14:14    Labs:  CBC: Recent Labs    04/04/19 1846  WBC 17.3*  HGB 15.3*  HCT 42.1  PLT 396    COAGS: No results for input(s): INR, APTT in the last 8760 hours.  BMP: Recent Labs    04/04/19 1846  NA 127*  K 3.4*  CL 88*  CO2 26  GLUCOSE 118*  BUN <5*  CALCIUM 9.1  CREATININE 0.63  GFRNONAA >60  GFRAA >60  LIVER FUNCTION TESTS: Recent Labs    04/04/19 1846  BILITOT 0.5  AST 19  ALT 16  ALKPHOS 95  PROT 6.5  ALBUMIN 3.3*     Assessment and Plan:  Sigmoid diverticular abscess. Plan for image-guided percutaneous intraabdominal drain placement in IR once consent has been obtained. Patient is NPO. Afebrile. She does not take blood thinners. Ok to proceed without INR per Dr. Archer Asa.  Risks and benefits discussed with the patient including bleeding, infection, damage to adjacent structures, bowel perforation/fistula connection, and sepsis. All of the patient's questions were answered, patient is agreeable to proceed. Consent obtained by patient's husband, Mikenzi Shawver, signed and in chart.   Thank you for this interesting consult.  I greatly enjoyed meeting TAHNEE BUCKWALTER and look forward to participating in their care.  A copy of this report was sent to the requesting provider on this date.  Electronically Signed: Elwin Mocha, PA-C 04/05/2019, 5:00 PM   I spent a total of 40 Minutes in face to face in clinical consultation, greater than 50% of which was counseling/coordinating care for sigmoid  diverticular abscess/intraabdominal drain placement.

## 2019-04-05 NOTE — Sedation Documentation (Signed)
Pt resting at this time.

## 2019-04-05 NOTE — Discharge Instructions (Addendum)
Diverticulitis  Diverticulitis is when small pockets in your large intestine (colon) get infected or swollen. This causes stomach pain and watery poop (diarrhea). These pouches are called diverticula. They form in people who have a condition called diverticulosis. Follow these instructions at home: Medicines  Take over-the-counter and prescription medicines only as told by your doctor. These include: ? Antibiotics. ? Pain medicines. ? Fiber pills. ? Probiotics. ? Stool softeners.  Do not drive or use heavy machinery while taking prescription pain medicine.  If you were prescribed an antibiotic, take it as told. Do not stop taking it even if you feel better. General instructions   Follow a diet as told by your doctor.  When you feel better, your doctor may tell you to change your diet. You may need to eat a lot of fiber. Fiber makes it easier to poop (have bowel movements). Healthy foods with fiber include: ? Berries. ? Beans. ? Lentils. ? Green vegetables.  Exercise 3 or more times a week. Aim for 30 minutes each time. Exercise enough to sweat and make your heart beat faster.  Keep all follow-up visits as told. This is important. You may need to have an exam of the large intestine. This is called a colonoscopy. Contact a doctor if:  Your pain does not get better.  You have a hard time eating or drinking.  You are not pooping like normal. Get help right away if:  Your pain gets worse.  Your problems do not get better.  Your problems get worse very fast.  You have a fever.  You throw up (vomit) more than one time.  You have poop that is: ? Bloody. ? Black. ? Tarry. Summary  Diverticulitis is when small pockets in your large intestine (colon) get infected or swollen.  Take medicines only as told by your doctor.  Follow a diet as told by your doctor. This information is not intended to replace advice given to you by your health care provider. Make sure you  discuss any questions you have with your health care provider. Document Released: 12/24/2007 Document Revised: 06/19/2017 Document Reviewed: 07/24/2016 Elsevier Patient Education  2020 [employer]   Low-Fiber Eating Plan Fiber is found in fruits, vegetables, whole grains, and beans. Eating a diet low in fiber helps to reduce how often you have bowel movements and how much you produce during a bowel movement. A low-fiber eating plan may help your digestive system heal if:  You have certain conditions, such as Crohn's disease or diverticulitis.  You recently had radiation therapy on your pelvis or bowel.  You recently had intestinal surgery.  You have a new surgical opening in your abdomen (colostomy or ileostomy).  Your intestine is narrowed (stricture). Your health care provider will determine how long you need to stay on this diet. Your health care provider may recommend that you work with a diet and nutrition specialist (dietitian). What are tips for following this plan? General guidelines  Follow recommendations from your dietitian about how much fiber you should have each day.  Most people on this eating plan should try to eat less than 10 grams (g) of fiber each day. Your daily fiber goal is _________________ g.  Take vitamin and mineral supplements as told by your health care provider or dietitian. Chewable or liquid forms are best when on this eating plan. Reading food labels  Check food labels for the amount of dietary fiber.  Choose foods that have less than 2 grams of  fiber in one serving. °Cooking °· Use white flour and other allowed grains for baking and cooking. °· Cook meat using methods that keep it tender, such as braising or poaching. °· Cook eggs until the yolk is completely solid. °· Cook with healthy oils, such as olive oil or canola oil. °Meal planning ° °· Eat 5-6 small meals throughout the day instead of 3 large meals. °· If you are lactose  intolerant: °? Choose low-lactose dairy foods. °? Do not eat dairy foods, if told by your dietitian. °· Limit fat and oils to less than 8 teaspoons a day. °· Eat small portions of desserts. °What foods are allowed? °The items listed below may not be a complete list. Talk with your dietitian about what dietary choices are best for you. °Grains °All bread and crackers made with white flour. Waffles, pancakes, and French toast. Bagels. Pretzels. Melba toast, zwieback, and matzoh. Cooked and dried cereals that do not contain whole grains, added fiber, seeds, or dried fruit. Cornmeal. [place]. Hot and cold cereals made with refined corn, wheat, rice, or oats. Plain pasta and noodles. White rice. °Vegetables °Well-cooked or canned vegetables without skin, seeds, or stems. Cooked potatoes without skins. Vegetable juice. °Fruits °Soft-cooked or canned fruits without skin and seeds. Peeled ripe banana. Applesauce. Fruit juice without pulp. °Meats and other protein foods °Ground meat. Tender cuts of meat or poultry. Eggs. Fish, seafood, and shellfish. Smooth nut butters. Tofu. °Dairy °All milk products and drinks. Lactose-free milks, including rice, soy, and almond milks. Yogurt without fruit, nuts, chocolate, or granola mix-ins. Sour cream. Cottage cheese. Cheese. °Beverages °Decaf coffee. Fruit and vegetable juices or smoothies (in small amounts, with no pulp or skins, and with fruits from allowed list). Sports drinks. Herbal tea. °Fats and oils °Olive oil, canola oil, sunflower oil, flaxseed oil, and grapeseed oil. Mayonnaise. Cream cheese. Margarine. Butter. °Sweets and desserts °Plain cakes and cookies. Cream pies and pies made with allowed fruits. Pudding. Custard. Fruit gelatin. Sherbet. Popsicles. Ice cream without nuts. Plain hard candy. Honey. Jelly. Molasses. Syrups, including chocolate syrup. Chocolate. Marshmallows. Gumdrops. °Seasoning and other foods °Bouillon. Broth. Cream soups made from allowed foods.  Strained soup. Casseroles made with allowed foods. Ketchup. Mild mustard. Mild salad dressings. Plain gravies. Vinegar. Spices in moderation. Salt. Sugar. °What foods are not allowed? °The items listed below may not be a complete list. Talk with your dietitian about what dietary choices are best for you. °Grains °Whole wheat and whole grain breads and crackers. Multigrain breads and crackers. Rye bread. Whole grain or multigrain cereals. Cereals with nuts, raisins, or coconut. Bran. Coarse wheat cereals. Granola. High-fiber cereals. Cornmeal or corn bread. Whole grain pasta. Wild or brown rice. Quinoa. Popcorn. Buckwheat. Wheat germ. °Vegetables °Potato skins. Raw or undercooked vegetables. All beans and bean sprouts. Cooked greens. Corn. Peas. Cabbage. Beets. Broccoli. Brussels sprouts. Cauliflower. Mushrooms. Onions. Peppers. Parsnips. Okra. Sauerkraut. °Fruit °Raw or dried fruit. Berries. Fruit juice with pulp. Prune juice. °Meats and other protein foods °Tough, fibrous meats with gristle. Fatty meat. Poultry with skin. Fried meat, poultry, or fish. Deli or lunch meats. Sausage, bacon, and hot dogs. Nuts and chunky nut butter. Dried peas, beans, and lentils. °Dairy °Yogurt with fruit, nuts, chocolate, or granola mix-ins. °Beverages °Caffeinated coffee and teas. °Fats and oils °Avocado. Coconut. °Sweets and desserts °Desserts, cookies, or candies that contain nuts or coconut. Dried fruit. Jams and preserves with seeds. Marmalade. Any dessert made with fruits or grains that are not allowed. °Seasoning and other   foods Corn tortilla chips. Soups made with vegetables or grains that are not allowed. Relish. Horseradish. Rosita FirePickles. Olives. Summary  Most people on a low-fiber eating plan should eat less than 10 grams of fiber a day. Follow recommendations from your dietitian about how much fiber you should have each day.  Always check food labels to see the dietary fiber content of packaged foods. In general, a  low-fiber food will have fewer than 2 grams of fiber per serving.  In general, try to avoid whole grains, raw fruits and vegetables, dried fruit, tough cuts of meat, nuts, and seeds.  Take a vitamin and mineral supplement as told by your health care provider or dietitian. This information is not intended to replace advice given to you by your health care provider. Make sure you discuss any questions you have with your health care provider. Document Released: 12/27/2001 Document Revised: 10/29/2018 Document Reviewed: 09/09/2016 Elsevier Patient Education  2020 [employer]    Percutaneous Abscess Drain, Care After This sheet gives you information about how to care for yourself after your procedure. Your health care provider may also give you more specific instructions. If you have problems or questions, contact your health care provider. What can I expect after the procedure? After your procedure, it is common to have:  A small amount of bruising and discomfort in the area where the drainage tube (catheter) was placed.  Sleepiness and fatigue. This should go away after the medicines you were given have worn off. Follow these instructions at home: Incision care  Follow instructions from your health care provider about how to take care of your incision. Make sure you: ? Wash your hands with soap and water before you change your bandage (dressing). If soap and water are not available, use hand sanitizer. ? Change your dressing as told by your health care provider. ? Leave stitches (sutures), skin glue, or adhesive strips in place. These skin closures may need to stay in place for 2 weeks or longer. If adhesive strip edges start to loosen and curl up, you may trim the loose edges. Do not remove adhesive strips completely unless your health care provider tells you to do that.  Check your incision area every day for signs of infection. Check for: ? More redness, swelling, or pain. ? More  fluid or blood. ? Warmth. ? Pus or a bad smell. ? Fluid leaking from around your catheter (instead of fluid draining through your catheter). Catheter care   Follow instructions from your health care provider about emptying and cleaning your catheter and collection bag. You may need to clean the catheter every day so it does not clog.  If directed, write down the following information every time you empty your bag: ? The date and time. ? The amount of drainage. General instructions  Rest at home for 1-2 days after your procedure. Return to your normal activities as told by your health care provider.  Do not take baths, swim, or use a hot tub for 24 hours after your procedure, or until your health care provider says that this is okay.  Take over-the-counter and prescription medicines only as told by your health care provider.  Keep all follow-up visits as told by your health care provider. This is important. Contact a health care provider if:  You have less than 10 mL of drainage a day for 2-3 days in a row, or as directed by your health care provider.  You have more redness, swelling, or pain  around your incision area.  You have more fluid or blood coming from your incision area.  Your incision area feels warm to the touch.  You have pus or a bad smell coming from your incision area.  You have fluid leaking from around your catheter (instead of through your catheter).  You have a fever or chills.  You have pain that does not get better with medicine. Get help right away if:  Your catheter comes out.  You suddenly stop having drainage from your catheter.  You suddenly have blood in the fluid that is draining from your catheter.  You become dizzy or you faint.  You develop a rash.  You have nausea or vomiting.  You have difficulty breathing or you feel short of breath.  You develop chest pain.  You have problems with your speech or vision.  You have trouble  balancing or moving your arms or legs. Summary  It is common to have a small amount of bruising and discomfort in the area where the drainage tube (catheter) was placed.  You may be directed to record the amount of drainage from the bag every time you empty it.  Follow instructions from your health care provider about emptying and cleaning your catheter and collection bag. This information is not intended to replace advice given to you by your health care provider. Make sure you discuss any questions you have with your health care provider. Document Released: 11/21/2013 Document Revised: 06/19/2017 Document Reviewed: 05/29/2016 Elsevier Patient Education  2020 [employer].   Surgical Drain Record Empty your surgical drain as told by your health care provider. Use this form to write down the amount of fluid that has collected in the drainage container. Bring this form with you to your follow-up visits.  drain #1 location: ___________________  Date __________ Time __________ Amount __________ Date __________ Time __________ Amount __________ Date __________ Time __________ Amount __________ Date __________ Time __________ Amount __________ Date __________ Time __________ Amount __________ Date __________ Time __________ Amount __________ Date __________ Time __________ Amount __________ Date __________ Time __________ Amount __________ Date __________ Time __________ Amount __________ Date __________ Time __________ Amount __________ Date __________ Time __________ Amount __________ Date __________ Time __________ Amount __________ Date __________ Time __________ Amount __________ Date __________ Time __________ Amount __________ Date __________ Time __________ Amount __________ Date __________ Time __________ Amount __________ Date __________ Time __________ Amount __________ Date __________ Time __________ Amount __________ Date __________ Time __________ Amount __________ Date  __________ Time __________ Amount __________ Date __________ Time __________ Amount __________ Date __________ Time __________ Amount __________ Date __________ Time __________ Amount __________ Date __________ Time __________ Amount __________ Date __________ Time __________ Amount __________ Date __________ Time __________ Amount __________ Date __________ Time __________ Amount __________ Date __________ Time __________ Amount __________ Date __________ Time __________ Amount __________ Date __________ Time __________ Amount __________ Date __________ Time __________ Amount __________ Date __________ Time __________ Amount __________ Date __________ Time __________ Amount __________ Date __________ Time __________ Amount __________ Date __________ Time __________ Amount __________ Date __________ Time __________ Amount __________ Date __________ Time __________ Amount __________ Date __________ Time __________ Amount __________ Date __________ Time __________ Amount __________ Date __________ Time __________ Amount __________ Date __________ Time __________ Amount __________ Date __________ Time __________ Amount __________ This information is not intended to replace advice given to you by your health care provider. Make sure you discuss any questions you have with your health care provider. Document Released: 04/13/2017 Document Revised: 04/13/2017 Document  Reviewed: 04/13/2017 Elsevier Patient Education  [employer].

## 2019-04-05 NOTE — Sedation Documentation (Signed)
Vital signs stable. 

## 2019-04-05 NOTE — ED Provider Notes (Signed)
[facility] EMERGENCY DEPARTMENT Provider Note   CSN: [id number] Arrival date & time: 04/04/19  1816     History   Chief Complaint Chief Complaint  Patient presents with   Abdominal Pain    HPI [name] is a 68 y.o. female.     Patient is a [age]-year-old female past medical history of diverticulosis, alcoholism, anxiety, status post duodenal ulcer with repair several years ago presenting to the emergency department with a chief complaint listed as abdominal pain.  When I enter the room patient reports that she just came here for a checkup.  Patient has been here for the past 14-1/2 hours and reports that she has not had any abdominal pain.  Denies any nausea, vomiting, diarrhea.  Reports that she is not very hungry and wants to eat and her only complaint is a mild headache.  Reports she would like some Tylenol for headache.  She denies any urinary symptoms.  Last bowel movement was yesterday and normal.  No pelvic symptoms.  No fevers.     Past Medical History:  Diagnosis Date   Alcoholism (HCC) 02/02/2009   ANXIETY 01/21/2007   Blood transfusion    hx of 2005    DIVERTICULOSIS 07/09/2007   Duodenal ulcer hemorrhage perforated 2006   Headache(784.0) 05/31/2007   pt denies at 08/12/11 preop visit    HYPERTENSION 06/23/2007   HYPONATREMIA 02/02/2009   better off diuretic   INSOMNIA 05/31/2007   Iron deficiency anemia    pt denies at 08/12/11 visit    NSAID-induced duodenal ulcer    OSTEOPOROSIS 01/21/2007   Raynaud's syndrome 06/23/2007   Recurrent upper respiratory infection (URI)    pt reports slight cold using Nyquil prn at bedtime    VITAMIN B12 DEFICIENCY 10/02/2009   Zoster 2012   twice - left groin/vagina    Patient Active Problem List   Diagnosis Date Noted   Malnutrition of moderate degree 08/23/2017   Hyponatremia 08/20/2017   Anemia 08/20/2017   Marginal ulcer, at gastrojejunostomy, resected 08/14/2011. 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   SHINGLES 08/19/2010   B12 deficiency 10/02/2009   Iron deficiency anemia 07/09/2007   DIVERTICULOSIS 07/09/2007   Essential hypertension 06/23/2007   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   INSOMNIA 05/31/2007   HEADACHE 05/31/2007   Anxiety state 01/21/2007   Alcoholism (HCC) 01/21/2007   OSTEOPOROSIS 01/21/2007    Past Surgical History:  Procedure Laterality Date   COLONOSCOPY  06/18/2007   angulated and stenotic sigmoid colon with severe sigmoid diverticulosis   EXPLORATORY LAPAROTOMY  08/03/2004   1) duodenal ulcer oversew, pyloroplasty, anterior, posterior and truncal vagotomies   EXPLORATORY LAPAROTOMY  10/20/2004   1) duodenal ulcer oversew  with exclusion2) side-side gastrojejunostomy 3) feeding jejunostomy   LAPAROSCOPY  08/14/2011   Procedure: LAPAROSCOPY DIAGNOSTIC;  Surgeon: Kandis Cocking, MD;  Location: [facility];  Service: General;  Laterality: N/A;   LAPAROTOMY  08/14/2011   Procedure: EXPLORATORY LAPAROTOMY;  Surgeon: Kandis Cocking, MD;  Location: [facility];  Service: General;  Laterality: N/A;  exploratory laparotomy, lysis of adhesions, revision gastrojejunostomy, upper endoscopy   OPEN REDUCTION INTERNAL FIXATION (ORIF) DISTAL RADIAL FRACTURE Right 05/01/2016   Procedure: OPEN REDUCTION INTERNAL FIXATION (ORIF) DISTAL RADIAL FRACTURE, right;  Surgeon: Betha Loa, MD;  Location: [facility];  Service: Orthopedics;  Laterality: Right;   TUBAL LIGATION  1977  UPPER GASTROINTESTINAL ENDOSCOPY  09/16/2007; 12/21/2008; 03/17/2011   2009: Normal 2010: Normal with enteroscopy2012: Gastrojejunal  anastomotic ulcer, gastric retention     OB History   No obstetric history on file.      Home Medications    Prior to Admission medications   Medication Sig Start Date End Date Taking? Authorizing Provider    acetaminophen (TYLENOL) 500 MG tablet Take 500 mg by mouth every 6 (six) hours as needed for mild pain.     [provider]  ALPRAZolam Prudy Feeler) 0.25 MG tablet Take 1 tablet (0.25 mg total) by mouth 2 (two) times daily as needed for anxiety. 01/27/18   Gordy Savers, MD  Ca Carbonate-Mag Hydroxide (ROLAIDS PO) Take 2 tablets by mouth daily as needed (indigestion).    [provider]  ferrous sulfate 325 (65 FE) MG tablet Take 1 tablet (325 mg total) by mouth 2 (two) times daily with a meal. 08/24/17   Maxie Barb, MD  folic acid (FOLVITE) 1 MG tablet Take 1 tablet (1 mg total) by mouth daily. Patient not taking: Reported on 10/20/2017 08/25/17   Maxie Barb, MD  gabapentin (NEURONTIN) 100 MG capsule TAKE 1 CAPSULE BY MOUTH TWICE A DAY 05/11/18   Roderick Pee, MD  lisinopril (PRINIVIL,ZESTRIL) 20 MG tablet  09/03/17   [provider]  Multiple Vitamin (MULTIVITAMIN WITH MINERALS) TABS tablet Take 1 tablet by mouth daily. 08/25/17   Maxie Barb, MD  thiamine (VITAMIN B-1) 50 MG tablet Take 1 tablet (50 mg total) by mouth daily. Patient not taking: Reported on 10/20/2017 09/03/17   Gordy Savers, MD  vitamin B-12 (CYANOCOBALAMIN) 1000 MCG tablet Take 1 tablet (1,000 mcg total) by mouth daily. 10/20/17   Gordy Savers, MD    Family History Family History  Problem Relation Age of Onset   Stroke Father     Social History Social History   Tobacco Use   Smoking status: Never Smoker   Smokeless tobacco: Never Used  Substance Use Topics   Alcohol use: Yes    Alcohol/week: 7.0 standard drinks    Types: 7 Cans of beer per week    Comment: 12 oz   Drug use: No     Allergies   Sulfa antibiotics, Sulfonamide derivatives, Penicillins, and Hydromorphone hcl   Review of Systems Review of Systems  Constitutional: Negative for chills and fever.  HENT: Negative for ear pain and sore throat.   Eyes: Negative for pain and  visual disturbance.  Respiratory: Negative for cough and shortness of breath.   Cardiovascular: Negative for chest pain and palpitations.  Gastrointestinal: Negative for abdominal pain, constipation, diarrhea, nausea and vomiting.  Genitourinary: Negative for dysuria, hematuria, pelvic pain and urgency.  Musculoskeletal: Negative for arthralgias and back pain.  Skin: Negative for color change and rash.  Neurological: Positive for headaches. Negative for dizziness and syncope.  All other systems reviewed and are negative.    Physical Exam Updated Vital Signs BP (!) 180/75 (BP Location: Right Arm)    Pulse 73    Temp 98.2 F (36.8 C) (Oral)    Resp 16    SpO2 100%   Physical Exam Vitals signs and nursing note reviewed.  Constitutional:      Appearance: Normal appearance. She is well-developed.  HENT:     Head: Normocephalic.  Eyes:     General: No scleral icterus.    Conjunctiva/sclera: Conjunctivae normal.  Cardiovascular:     Rate and Rhythm: Normal  Ge7<MEA385 Sheliah HCecille Nort27Bristol-MyersJes Sq ibbrdnd mJMclar81en humb RegiDKaiser [phone]-Sheliah Hatch3BBlue Ridge Surgery Gerome Apleyte yrnNorton76.04m BB146 HudsoElberta Fortis.ers SquibbderomEwing Residen59tial CNebraska Surgery CenSurgce734-044Sheliah Hatch71ADTECecille AverGMyrNorton52m BBristol-Myers Squibbd1686.0Jess c14a P<BADTEXTTAGSheliah HatchstewatMe2mDBristol-Myers Squibb Sou219 225Sheliah Hatch15ced Cecille AvereMyrNorton29m BBristol-Myers SquibbdaSt. JJess ca PrKate Dishman Rehabilitation Hospit534-474Sheliah Hatch62[facility] Medical CentGerome ApleyMy orton70m [phone] Hatchtic StCecille AveryrnaNorton76m BBristol-Myers SquibbdMJessiJess ca Priestle GenBoTexas Health Harris Methodist [facility] Gen67eral HospitaOverlook MedicaGeromCentral Wyoming Outpatient Surgery Center LLCENT>[phone] Cgh MGerome Apleyca CenterchBA6.04TEXC146 [place] StreeteryElberta Fortisrto601-484Sheliah Hatch75TAG>Cecille AverzMyrNorton15m BBristol-Myers Squibbdital OJess<[phone]-Sheliah Hatch8G>ca Cecille AverMyrnNorton25m BBristol-Myers SquibbdurgicaJess ca Priestuth SuBoyEagle Physicians And Associates P30asea ch HosEncompass Health Rehabilitation Hospital OConnecticut Orthopaedic Surgery Center Bl11azer CLCGerH. C. Watkins Memorial HospitalGerome Apleyrna Blazeryoue.  the duodenal bulb. A 15 mm high attenuating structure within this may represent a chronic stone although an enhancing lesion is not entirely excluded. Vascular/Lymphatic: Moderate aortoiliac atherosclerotic disease. The IVC is unremarkable. No portal venous gas. There is no adenopathy. Reproductive: The uterus is grossly unremarkable. There is loss of fat plane between the superior uterus and diverticular abscess of the sigmoid colon. No adnexal masses. Other: None Musculoskeletal: Osteopenia. No acute osseous pathology. IMPRESSION: 1. Sigmoid diverticulitis with a large diverticular abscess. There is loss of fat plane between the superior uterus and diverticular abscess. 2. A 15 mm high attenuating structure in the anterior upper abdomen appears similar to prior CT and may represent the duodenal bulb. Aortic Atherosclerosis (ICD10-I70.0). Electronically Signed   By: Elgie CollardArash  Radparvar M.D.   On: 04/05/2019 14:14    Procedures Procedures (including critical care time)  Medications Ordered in ED Medications  sodium chloride flush (NS) 0.9 % injection 3 mL (has no administration in time range)  metroNIDAZOLE (FLAGYL) IVPB 500 mg (500 mg Intravenous New Bag/Given 04/05/19 1549)  sodium chloride 0.9 % bolus 1,000 mL (has no administration in time range)  acetaminophen (TYLENOL) tablet 650 mg (650 mg Oral Given 04/05/19 0950)  cephALEXin (KEFLEX) capsule 500 mg (500 mg Oral Given 04/05/19  0950)  iohexol (OMNIPAQUE) 300 MG/ML solution 100 mL (100 mLs Intravenous Contrast Given 04/05/19 1331)  cefTRIAXone (ROCEPHIN) 2 g in sodium chloride 0.9 % 100 mL IVPB (0 g Intravenous Stopped 04/05/19 1547)     Initial Impression / Assessment and Plan / ED Course  I have reviewed the triage vital signs and the nursing notes.  Pertinent labs & imaging results that were available during my care of the patient were reviewed by me and considered in my medical decision making (see chart for details).  Clinical Course as of Apr 04 1640  Tue Apr 05, 2019  [id number] Patient presents today telling me that she wants just a "checkup" with her blood pressure and temperature.  Her chief complaint was listed as abdominal pain.  She is denying any of these complaints or associated GI symptoms.  Her vitals have been unremarkable throughout her 14-hour stay here.  Patient's metabolic panel is significant for a sodium of 127 but this is chronic and not new, likely related to poor oral intake.  Potassium 3.4.  Her CBC is remarkable for a white count of 17.3.  Unknown etiology of leukocytosis.  She does not have any urinary symptoms but her urine sample does reveal moderate leukocytes, white blood cells and rare bacteria without any epithelial cells.  Given this in the context of the increased white blood cell count and wearing a treated for urinary tract infection. Dr. Denton LankSteinl aware and agrees with plan   [KM]  [phone] I spoke with the patient's husband at this time.  He states that the patient is not being truthful with her symptoms and that she has been screaming out in pain at home.  Reports that she has taken several bottles of Pepto-Bismol as well as Tums to help with her abdominal pain.  Reports that she is not eating at all and he is very concerned.  Reports that this was similar to when she had a perforation in her abdomen before.  At this time I am going to order a CT abdomen with contrast of her belly.  Patient still  remains stable and denies any pain.  Patient cooperative with plan.   [KM]  1433 Ct scan showing large intra  abdominal abscess and diverticulitis.  GI surgeries in the past include Dr Hulen Skains had done a pyloroplasty and vagotomy 08/03/2004 for a perforated ulcer. She represented with a more severe perforated ulcer just a few months later. This required an exploration with duodenal exclusion, gastrojejunostomy, and feeding jejunostomy 10/20/2004 - Dr. Keturah Barre. Newman.    [KM]  [phone] General surgery was paged x3 for this patient. Still waiting on phone call. She is stable without complaints. Abx on board.    [KM]  1611 Spoke with General surgery Dr. Earnstine Regal who stated Dr. Lucia Gaskins looked at the patients CT and patient will be admitted to hospitalist and IR will drain the abscess.    [KM]  1640 Patient to be admitted to hospitalist by Dr. Tamala Julian   [KM]    Clinical Course User Index [KM] Alveria Apley, PA-C         Final Clinical Impressions(s) / ED Diagnoses   Final diagnoses:  Intra-abdominal abscess [facility])  Diverticulitis    ED Discharge Orders    None       Kristine Royal 04/05/19 1642    Lajean Saver, MD 04/06/19 [phone]

## 2019-04-05 NOTE — Consult Note (Addendum)
[facility] Surgery Consult  Note  [name] 01-15-1951  [id number].    Requesting MD: Ronnie Doss  Chief Complaint: abdominal pain Reason for Consult:  Sigmoid diverticulitis with abscess  HPI: Patient is a 68 year old female who presented to the ED today with complaints of abdominal pain and a loss of appetite for the last couple days.  When she was seen by the ED provider she says she was just there for checkup she said she has been there for 14.5 hours; abdominal pain was better she denied any nausea, vomiting, or diarrhea.  She was not hungry and wanted to eat she also had a mild headache.  Work-up shows she is afebrile blood pressure was mildly elevated.  She was tachycardic up to 121 at 1 point.  Labs shows a sodium of 127, potassium of 3.4, chloride of 88, glucose of 118, creatinine 0.63 LFTs were normal.  WBC is 17.3, hemoglobin 15.3, hematocrit 42.1.  Platelets are 396,000.  COVID study is negative.  Analysis is nitrate negative with 11-20 white cells per high-powered field.  CT scan shows sigmoid diverticulitis with 9.7 x 3.7 x 3.8 cm diverticular abscess along the colon.  There is postsurgical changes from prior gastrojejunostomy.  No evidence of bowel obstruction.  A 15 mm high attenuating structure in the anterior upper abdomen appears similar to prior CT and may represent the duodenal bulb.  We are asked to see.   ROS: Review of Systems  Constitutional:       Cachectic disheveled white female in no distress.  Ambulating comfortably back from the bathroom, only complaint currently is of a headache.  HENT: Negative.   Eyes: Negative.   Respiratory: Negative.   Cardiovascular: Negative.   Gastrointestinal: Positive for abdominal pain. Negative for blood in stool, constipation, diarrhea, heartburn, nausea and vomiting.  Genitourinary: Negative.   Musculoskeletal: Negative.   Skin: Negative for itching and rash.  Neurological: Positive for headaches.   Endo/Heme/Allergies: Negative.   Psychiatric/Behavioral:       Patient is confused she does not know what year it is.  She made several gases all of which were incorrect. She thinks her husband is upstairs sleeping. She is not sure where she is ultimately get she was in the hospital.  Still suggesting her husband is upstairs. She could not remember how she got here.  Then she said her husband brought her, and then again said he was upstairs sleeping.  She did get the president right.    Family History  Problem Relation Age of Onset  . Stroke Father     Past Medical History:  Diagnosis Date  . Alcoholism (HCC) 02/02/2009  . ANXIETY 01/21/2007  . Blood transfusion    hx of 2005   . DIVERTICULOSIS 07/09/2007  . Duodenal ulcer hemorrhage perforated 2006  . Headache(784.0) 05/31/2007   pt denies at 08/12/11 preop visit   . HYPERTENSION 06/23/2007  . HYPONATREMIA 02/02/2009   better off diuretic  . INSOMNIA 05/31/2007  . Iron deficiency anemia    pt denies at 08/12/11 visit   . NSAID-induced duodenal ulcer   . OSTEOPOROSIS 01/21/2007  . Raynaud's syndrome 06/23/2007  . Recurrent upper respiratory infection (URI)    pt reports slight cold using Nyquil prn at bedtime   . VITAMIN B12 DEFICIENCY 10/02/2009  . Zoster 2012   twice - left groin/vagina    Past Surgical History:  Procedure Laterality Date  . COLONOSCOPY  06/18/2007   angulated and stenotic sigmoid colon with  severe sigmoid diverticulosis  . EXPLORATORY LAPAROTOMY  08/03/2004   1) duodenal ulcer oversew, pyloroplasty, anterior, posterior and truncal vagotomies  . EXPLORATORY LAPAROTOMY  10/20/2004   1) duodenal ulcer oversew  with exclusion2) side-side gastrojejunostomy 3) feeding jejunostomy  . LAPAROSCOPY  08/14/2011   Procedure: LAPAROSCOPY DIAGNOSTIC;  Surgeon: Kandis Cocking, MD;  Location: [facility];  Service: General;  Laterality: N/A;  . LAPAROTOMY  08/14/2011   Procedure: EXPLORATORY LAPAROTOMY;  Surgeon: Kandis Cocking,  MD;  Location: [facility];  Service: General;  Laterality: N/A;  exploratory laparotomy, lysis of adhesions, revision gastrojejunostomy, upper endoscopy  . OPEN REDUCTION INTERNAL FIXATION (ORIF) DISTAL RADIAL FRACTURE Right 05/01/2016   Procedure: OPEN REDUCTION INTERNAL FIXATION (ORIF) DISTAL RADIAL FRACTURE, right;  Surgeon: Betha Loa, MD;  Location: [facility];  Service: Orthopedics;  Laterality: Right;  . TUBAL LIGATION  1977  . UPPER GASTROINTESTINAL ENDOSCOPY  09/16/2007; 12/21/2008; 03/17/2011   2009: Normal 2010: Normal with enteroscopy2012: Gastrojejunal  anastomotic ulcer, gastric retention    Social History:  reports that she has never smoked. She has never used smokeless tobacco. She reports current alcohol use of about 7.0 standard drinks of alcohol per week. She reports that she does not use drugs.  Allergies:  Allergies  Allergen Reactions  . Sulfa Antibiotics Anaphylaxis and Rash  . Sulfonamide Derivatives Anaphylaxis and Rash  . Penicillins Other (See Comments)    Has patient had a PCN reaction causing immediate rash, facial/tongue/throat swelling, SOB or lightheadedness with hypotension: No Has patient had a PCN reaction causing severe rash involving mucus membranes or skin necrosis: No Has patient had a PCN reaction that required hospitalization No Has patient had a PCN reaction occurring within the last 10 years: No If all of the above answers are "NO", then may proceed with Cephalosporin use.  Marland Kitchen Hydromorphone Hcl Rash    (Not in a hospital admission)   Blood pressure (!) 180/75, pulse 73, temperature 98.2 F (36.8 C), temperature source Oral, resp. rate 16, SpO2 100 %. Physical Exam: Physical Exam Constitutional:      General: She is not in acute distress.    Appearance: She is ill-appearing. She is not toxic-appearing or diaphoretic.     Comments: Thin cachectic female, ambulating comfortably.  Currently denying any abdominal pain her only complaint  is a headache.  She is extremely confused.  HENT:     Head: Normocephalic and atraumatic.  Cardiovascular:     Rate and Rhythm: Regular rhythm. Tachycardia present.     Heart sounds: No murmur.  Pulmonary:     Effort: Pulmonary effort is normal. No respiratory distress.     Breath sounds: Normal breath sounds. No stridor. No wheezing, rhonchi or rales.  Chest:     Chest wall: No tenderness.  Abdominal:     General: Abdomen is flat. Bowel sounds are decreased.     Palpations: Abdomen is soft.     Tenderness: There is abdominal tenderness in the left lower quadrant.     Hernia: No hernia is present.     Comments: Abdomen is flat and soft.  She has midline abdominal surgical scars.  She is tender to palpation left lower quadrant.  Skin:    General: Skin is warm and dry.  Neurological:     General: No focal deficit present.     Mental Status: She is alert. She is disoriented.  Psychiatric:     Comments: Patient is extremely confused.  She is not sure where  she is.  She does not know how she got here.  She does not complain of any abdominal pain currently.  She does not know the year.  She was aware of the president.     Results for orders placed or performed during the hospital encounter of 04/04/19 (from the past 48 hour(s))  Lipase, blood     Status: None   Collection Time: 04/04/19  6:46 PM  Result Value Ref Range   Lipase 17 11 - 51 U/L    Comment: Performed at [facility], [address], [place], [place] [address]  Comprehensive metabolic panel     Status: Abnormal   Collection Time: 04/04/19  6:46 PM  Result Value Ref Range   Sodium 127 (L) 135 - 145 mmol/L   Potassium 3.4 (L) 3.5 - 5.1 mmol/L   Chloride 88 (L) 98 - 111 mmol/L   CO2 26 22 - 32 mmol/L   Glucose, Bld 118 (H) 70 - 99 mg/dL   BUN <5 (L) 8 - 23 mg/dL   Creatinine, Ser 1.91 0.44 - 1.00 mg/dL   Calcium 9.1 8.9 - 47.8 mg/dL   Total Protein 6.5 6.5 - 8.1 g/dL   Albumin 3.3 (L) 3.5 - 5.0 g/dL   AST 19 15  - 41 U/L   ALT 16 0 - 44 U/L   Alkaline Phosphatase 95 38 - 126 U/L   Total Bilirubin 0.5 0.3 - 1.2 mg/dL   GFR calc non Af Amer >60 >60 mL/min   GFR calc Af Amer >60 >60 mL/min   Anion gap 13 5 - 15    Comment: Performed at [facility], [address], [place], [place] [address]  CBC     Status: Abnormal   Collection Time: 04/04/19  6:46 PM  Result Value Ref Range   WBC 17.3 (H) 4.0 - 10.5 K/uL   RBC 4.51 3.87 - 5.11 MIL/uL   Hemoglobin 15.3 (H) 12.0 - 15.0 g/dL   HCT 13.0 86.5 - 78.4 %   MCV 93.3 80.0 - 100.0 fL   MCH 33.9 26.0 - 34.0 pg   MCHC 36.3 (H) 30.0 - 36.0 g/dL   RDW 69.6 29.5 - 28.4 %   Platelets 396 150 - 400 K/uL   nRBC 0.0 0.0 - 0.2 %    Comment: Performed at [facility], [address], [place], [place] [address]  Urinalysis, Routine w reflex microscopic     Status: Abnormal   Collection Time: 04/05/19  9:00 AM  Result Value Ref Range   Color, Urine AMBER (A) YELLOW    Comment: BIOCHEMICALS MAY BE AFFECTED BY COLOR   APPearance HAZY (A) CLEAR   Specific Gravity, Urine 1.021 1.005 - 1.030   pH 5.0 5.0 - 8.0   Glucose, UA NEGATIVE NEGATIVE mg/dL   Hgb urine dipstick SMALL (A) NEGATIVE   Bilirubin Urine NEGATIVE NEGATIVE   Ketones, ur 5 (A) NEGATIVE mg/dL   Protein, ur 30 (A) NEGATIVE mg/dL   Nitrite NEGATIVE NEGATIVE   Leukocytes,Ua MODERATE (A) NEGATIVE   RBC / HPF 6-10 0 - 5 RBC/hpf   WBC, UA 11-20 0 - 5 WBC/hpf   Bacteria, UA RARE (A) NONE SEEN   Squamous Epithelial / LPF 0-5 0 - 5   Mucus PRESENT    Hyaline Casts, UA PRESENT     Comment: Performed at [facility], [address] [place], [place] [address]   Ct Abdomen Pelvis W Contrast  Result Date: 04/05/2019 CLINICAL DATA:  68 year old female with abdominal pain. History of prior perforated duodenal ulcer and exclude Terri laparotomy and pyloroplasty. EXAM: CT ABDOMEN AND PELVIS WITH CONTRAST TECHNIQUE: Multidetector CT imaging of the abdomen and pelvis was performed using the  standard protocol following bolus administration of intravenous contrast. CONTRAST:  OMNIPAQUE IOHEXOL 300 MG/ML  SOLN COMPARISON:  CT of the abdomen pelvis dated 08/05/2011. FINDINGS: Evaluation of this exam is limited due to respiratory motion artifact. Lower chest: The visualized lung bases are clear. Minimal right middle lobe atelectasis/scarring. There is mild cardiomegaly. Coronary vascular calcifications noted. No definite intra-abdominal free air or free fluid. Hepatobiliary: A 1 cm hypodense lesion in the dome of the liver is not well characterized but appears similar to prior CT, likely a cyst. The liver is otherwise unremarkable. There is mild intrahepatic biliary ductal dilatation. No calcified gallstone or pericholecystic fluid. Pancreas: Unremarkable. No pancreatic ductal dilatation or surrounding inflammatory changes. Spleen: Normal in size without focal abnormality. Adrenals/Urinary Tract: The adrenal glands are unremarkable. There is no hydronephrosis on either side. There is symmetric enhancement and excretion of contrast by both kidneys. Subcentimeter left renal hypodense focus is too small to characterize but likely represents a cyst. The urinary bladder is unremarkable. Stomach/Bowel: Extensive sigmoid diverticulosis with active inflammatory changes. There is a 9.7 x 3.7 x 3.8 cm diverticular abscess along the sigmoid colon. Postsurgical changes of gastrojejunostomy. No evidence of bowel obstruction. A fluid containing structure with pockets of air in the upper abdomen anteriorly (series 3, image 27) appears similar to the CT of 2013 and may represent the duodenal bulb. A 15 mm high attenuating structure within this may represent a chronic stone although an enhancing lesion is not entirely excluded. Vascular/Lymphatic: Moderate aortoiliac atherosclerotic disease. The IVC is unremarkable. No portal venous gas. There is no adenopathy. Reproductive: The uterus is grossly unremarkable. There  is loss of fat plane between the superior uterus and diverticular abscess of the sigmoid colon. No adnexal masses. Other: None Musculoskeletal: Osteopenia. No acute osseous pathology. IMPRESSION: 1. Sigmoid diverticulitis with a large diverticular abscess. There is loss of fat plane between the superior uterus and diverticular abscess. 2. A 15 mm high attenuating structure in the anterior upper abdomen appears similar to prior CT and may represent the duodenal bulb. Aortic Atherosclerosis (ICD10-I70.0). Electronically Signed   By: Elgie Collard M.D.   On: 04/05/2019 14:14      Assessment/Plan Hyponatremia Confusion Hx hypertension Hx malabsorption syndrome Hx malnutrition Hx anemia History of alcoholism S/p duodenal ulcer repair Anxiety/insomnia Hx anemia   Severe sigmoid diverticulitis with 9.7 x 3.7 x 3.8 cm diverticular abscess Hx of prior Roux-en-Y -s/p exploratory laparotomy enteral lysis of adhesions, revision of gastrojejunostomy to retrocolic gastrojejunostomy and upper endoscopy 08/14/2011 Dr. [name] Hx perforated ulcer with exploratory laparotomy, duodenal exclusion, gastrojejunostomy and feeding jejunostomy, Dr. [name], 08/03/2004.   FEN: N.p.o./IV fluids ID: Rocephin/Flagyl -penicillin allergy DVT: Can start after IR procedure   Plan: Agree with continuing Flagyl and Rocephin right now.  Medicine will admit.   I requested IR evaluation.  Patient is very confused and malnourished.  I will order labs for the a.m., including coagulation studies, and a pre-albumin.  She may need a PICC line in TNA.  Right now the only pain she has is on palpation of the left lower quadrant and a headache.  I have made her n.p.o. with sips and chips tonight and n.p.o. after midnight for possible IR intervention in the a.m.  We will follow with you.  Sherrie George [facility] Surgery 04/05/2019, 4:10 PM Pager: [phone]  Agree with above. Fairly sizeable  abscess.  [name], MD, [facility] Surgery Pager: [phone] Office phone:  [phone]

## 2019-04-05 NOTE — Sedation Documentation (Signed)
Pt tolerated procedure well. Transported back to ed and gave RN report

## 2019-04-05 NOTE — Sedation Documentation (Signed)
Pt resting, procedure started. Vitals stable.

## 2019-04-06 ENCOUNTER — Inpatient Hospital Stay (HOSPITAL_COMMUNITY): Payer: PPO

## 2019-04-06 DIAGNOSIS — K651 Peritoneal abscess: Secondary | ICD-10-CM

## 2019-04-06 DIAGNOSIS — K5792 Diverticulitis of intestine, part unspecified, without perforation or abscess without bleeding: Secondary | ICD-10-CM

## 2019-04-06 LAB — HEPATIC FUNCTION PANEL
ALT: 14 U/L (ref 0–44)
AST: 19 U/L (ref 15–41)
Albumin: 3 g/dL — ABNORMAL LOW (ref 3.5–5.0)
Alkaline Phosphatase: 86 U/L (ref 38–126)
Bilirubin, Direct: 0.2 mg/dL (ref 0.0–0.2)
Indirect Bilirubin: 0.6 mg/dL (ref 0.3–0.9)
Total Bilirubin: 0.8 mg/dL (ref 0.3–1.2)
Total Protein: 6.5 g/dL (ref 6.5–8.1)

## 2019-04-06 LAB — LIPID PANEL
Cholesterol: 163 mg/dL (ref 0–200)
HDL: 41 mg/dL (ref >40)
LDL Cholesterol: 108 mg/dL — ABNORMAL HIGH (ref 0–99)
Total CHOL/HDL Ratio: 4 RATIO
Triglycerides: 69 mg/dL (ref <150)
VLDL: 14 mg/dL (ref 0–40)

## 2019-04-06 LAB — CBC
HCT: 38.3 % (ref 36.0–46.0)
Hemoglobin: 14 g/dL (ref 12.0–15.0)
MCH: 33.9 pg (ref 26.0–34.0)
MCHC: 36.6 g/dL — ABNORMAL HIGH (ref 30.0–36.0)
MCV: 92.7 fL (ref 80.0–100.0)
Platelets: 381 10*3/uL (ref 150–400)
RBC: 4.13 MIL/uL (ref 3.87–5.11)
RDW: 12.3 % (ref 11.5–15.5)
WBC: 14.5 10*3/uL — ABNORMAL HIGH (ref 4.0–10.5)
nRBC: 0 % (ref 0.0–0.2)

## 2019-04-06 LAB — IRON AND TIBC
Iron: 49 ug/dL (ref 28–170)
Saturation Ratios: 23 % (ref 10.4–31.8)
TIBC: 217 ug/dL — ABNORMAL LOW (ref 250–450)
UIBC: 168 ug/dL

## 2019-04-06 LAB — BASIC METABOLIC PANEL
Anion gap: 14 (ref 5–15)
BUN: <5 mg/dL — ABNORMAL LOW (ref 8–23)
CO2: 21 mmol/L — ABNORMAL LOW (ref 22–32)
Calcium: 8 mg/dL — ABNORMAL LOW (ref 8.9–10.3)
Chloride: 93 mmol/L — ABNORMAL LOW (ref 98–111)
Creatinine, Ser: 0.5 mg/dL (ref 0.44–1.00)
GFR calc Af Amer: >60 mL/min (ref >60)
GFR calc non Af Amer: >60 mL/min (ref >60)
Glucose, Bld: 81 mg/dL (ref 70–99)
Potassium: 3.4 mmol/L — ABNORMAL LOW (ref 3.5–5.1)
Sodium: 128 mmol/L — ABNORMAL LOW (ref 135–145)

## 2019-04-06 LAB — PROTIME-INR
INR: 1.2 (ref 0.8–1.2)
Prothrombin Time: 15 seconds (ref 11.4–15.2)

## 2019-04-06 LAB — AMMONIA: Ammonia: 11 umol/L (ref 9–35)

## 2019-04-06 LAB — RETICULOCYTES
Immature Retic Fract: 7.2 % (ref 2.3–15.9)
RBC.: 4.33 MIL/uL (ref 3.87–5.11)
Retic Count, Absolute: 44.6 10*3/uL (ref 19.0–186.0)
Retic Ct Pct: 1 % (ref 0.4–3.1)

## 2019-04-06 LAB — URINE CULTURE: Culture: <10000 — AB

## 2019-04-06 LAB — VITAMIN B12: Vitamin B-12: 1840 pg/mL — ABNORMAL HIGH (ref 180–914)

## 2019-04-06 LAB — PREALBUMIN: Prealbumin: <5 mg/dL — ABNORMAL LOW (ref 18–38)

## 2019-04-06 LAB — APTT: aPTT: 40 seconds — ABNORMAL HIGH (ref 24–36)

## 2019-04-06 LAB — PHOSPHORUS: Phosphorus: 3.7 mg/dL (ref 2.5–4.6)

## 2019-04-06 LAB — MAGNESIUM: Magnesium: 1.8 mg/dL (ref 1.7–2.4)

## 2019-04-06 LAB — TSH: TSH: 2.446 u[IU]/mL (ref 0.350–4.500)

## 2019-04-06 LAB — HIV ANTIBODY (ROUTINE TESTING W REFLEX): HIV Screen 4th Generation wRfx: NONREACTIVE

## 2019-04-06 MED ORDER — BOOST / RESOURCE BREEZE PO LIQD CUSTOM
1.0000 | Freq: Three times a day (TID) | ORAL | Status: DC
  Administered 2019-04-06 – 2019-04-12 (×14): 1 via ORAL

## 2019-04-06 MED ORDER — THIAMINE HCL 100 MG/ML IJ SOLN: 100.0000 mg | Freq: Every day | INTRAMUSCULAR | Status: DC

## 2019-04-06 MED ORDER — LORAZEPAM 2 MG/ML IJ SOLN
0.0000 mg | Freq: Four times a day (QID) | INTRAMUSCULAR | Status: AC
  Administered 2019-04-07: 05:00:00 1 mg via INTRAVENOUS
  Filled 2019-04-06: qty 1

## 2019-04-06 MED ORDER — DOCUSATE SODIUM 100 MG PO CAPS
200.0000 mg | ORAL_CAPSULE | Freq: Two times a day (BID) | ORAL | Status: DC
  Administered 2019-04-06 – 2019-04-14 (×6): 200 mg via ORAL
  Filled 2019-04-06 (×12): qty 2

## 2019-04-06 MED ORDER — PANTOPRAZOLE SODIUM 40 MG IV SOLR
40.0000 mg | Freq: Two times a day (BID) | INTRAVENOUS | Status: DC
  Administered 2019-04-06 – 2019-04-11 (×12): 40 mg via INTRAVENOUS
  Filled 2019-04-06 (×12): qty 40

## 2019-04-06 MED ORDER — POTASSIUM CHLORIDE 2 MEQ/ML IV SOLN
INTRAVENOUS | Status: AC
  Administered 2019-04-06 (×2): via INTRAVENOUS
  Filled 2019-04-06 (×3): qty 1000

## 2019-04-06 MED ORDER — LORAZEPAM 1 MG PO TABS: 1.0000 mg | ORAL_TABLET | ORAL | Status: AC | PRN

## 2019-04-06 MED ORDER — ADULT MULTIVITAMIN W/MINERALS CH
1.0000 | ORAL_TABLET | Freq: Every day | ORAL | Status: DC
  Administered 2019-04-06 – 2019-04-14 (×8): 1 via ORAL
  Filled 2019-04-06 (×8): qty 1

## 2019-04-06 MED ORDER — ALPRAZOLAM 0.25 MG PO TABS
0.2500 mg | ORAL_TABLET | Freq: Two times a day (BID) | ORAL | Status: DC | PRN
  Administered 2019-04-06: 0.25 mg via ORAL
  Filled 2019-04-06: qty 1

## 2019-04-06 MED ORDER — LORAZEPAM 2 MG/ML IJ SOLN
1.0000 mg | INTRAMUSCULAR | Status: AC | PRN
  Administered 2019-04-07: 2 mg via INTRAVENOUS
  Filled 2019-04-06 (×2): qty 1

## 2019-04-06 MED ORDER — VITAMIN B-1 100 MG PO TABS
100.0000 mg | ORAL_TABLET | Freq: Every day | ORAL | Status: DC
  Administered 2019-04-06 – 2019-04-07 (×2): 100 mg via ORAL
  Filled 2019-04-06 (×2): qty 1

## 2019-04-06 MED ORDER — SODIUM CHLORIDE 0.9% FLUSH
5.0000 mL | Freq: Three times a day (TID) | INTRAVENOUS | Status: DC
  Administered 2019-04-06 – 2019-04-13 (×18): 5 mL

## 2019-04-06 MED ORDER — ASPIRIN 81 MG PO CHEW
81.0000 mg | CHEWABLE_TABLET | Freq: Every day | ORAL | Status: DC
  Administered 2019-04-06 – 2019-04-14 (×9): 81 mg via ORAL
  Filled 2019-04-06 (×9): qty 1

## 2019-04-06 MED ORDER — PRO-STAT SUGAR FREE PO LIQD: 30.0000 mL | Freq: Two times a day (BID) | ORAL | Status: DC

## 2019-04-06 MED ORDER — LORAZEPAM 2 MG/ML IJ SOLN: 0.0000 mg | Freq: Two times a day (BID) | INTRAMUSCULAR | Status: AC

## 2019-04-06 MED ORDER — ALPRAZOLAM 0.5 MG PO TABS: 0.5000 mg | ORAL_TABLET | Freq: Three times a day (TID) | ORAL | Status: DC | PRN

## 2019-04-06 MED ORDER — FOLIC ACID 1 MG PO TABS
1.0000 mg | ORAL_TABLET | Freq: Every day | ORAL | Status: DC
  Administered 2019-04-06 – 2019-04-14 (×9): 1 mg via ORAL
  Filled 2019-04-06 (×9): qty 1

## 2019-04-06 MED ORDER — HALOPERIDOL LACTATE 5 MG/ML IJ SOLN
2.0000 mg | Freq: Four times a day (QID) | INTRAMUSCULAR | Status: DC | PRN
  Administered 2019-04-06 – 2019-04-13 (×7): 2 mg via INTRAVENOUS
  Filled 2019-04-06 (×7): qty 1

## 2019-04-06 NOTE — Progress Notes (Signed)
Referring Physician(s): Dr. Ezzard StandingNewman  Supervising Physician: Oley BalmHassell, Daniel  Patient Status[facility]  [facility]  Chief Complaint: Diverticular abscess  Subjective: Confused, restless.  Denies abdominal pain but is requesting Tylenol.  Allergies: Sulfa antibiotics, Sulfonamide derivatives, Penicillins, and Hydromorphone hcl  Medications: Prior to Admission medications   Medication Sig Start Date End Date Taking? Authorizing Provider  acetaminophen (TYLENOL) 500 MG tablet Take 500 mg by mouth every 6 (six) hours as needed for mild pain.    Yes [provider]     Vital Signs: BP (!) 148/76 (BP Location: Right Arm)    Pulse 64    Temp 97.8 F (36.6 C) (Oral)    Resp 18    Ht 5\' 3"  (1.6 m)    Wt 94 lb 2.2 oz (42.7 kg)    SpO2 98%    BMI 16.68 kg/m   Physical Exam  NAD, alert, agitated Abdomen: soft, non-tender.  Drain in place. Insertion site c/d/i. Cloudy, dark, bloody fluid in bulb.   Imaging: Ct Head Wo Contrast  Result Date: 04/06/2019 CLINICAL DATA:  Altered level of consciousness today. EXAM: CT HEAD WITHOUT CONTRAST TECHNIQUE: Contiguous axial images were obtained from the base of the skull through the vertex without intravenous contrast. COMPARISON:  Head CT scan 06/04/2014. FINDINGS: Brain: No evidence of acute infarction, hemorrhage, hydrocephalus, extra-axial collection or mass lesion/mass effect. Chronic microvascular ischemic change has markedly worsened since the prior head CT. Left frontal infarct is remote but new since the prior CT. Vascular: No hyperdense vessel or unexpected calcification. Skull: Intact.  No focal lesion. Sinuses/Orbits: Negative. Other: None. IMPRESSION: No acute abnormality. Extensive chronic microvascular ischemic change. Remote left frontal infarct noted. Electronically Signed   By: Drusilla Kannerhomas  Dalessio M.D.   On: 04/06/2019 13:34   Ct Abdomen Pelvis W Contrast  Result Date: 04/05/2019 CLINICAL DATA:  68 year old female with abdominal  pain. History of prior perforated duodenal ulcer and exclude Terri laparotomy and pyloroplasty. EXAM: CT ABDOMEN AND PELVIS WITH CONTRAST TECHNIQUE: Multidetector CT imaging of the abdomen and pelvis was performed using the standard protocol following bolus administration of intravenous contrast. CONTRAST:  100mL OMNIPAQUE IOHEXOL 300 MG/ML  SOLN COMPARISON:  CT of the abdomen pelvis dated 08/05/2011. FINDINGS: Evaluation of this exam is limited due to respiratory motion artifact. Lower chest: The visualized lung bases are clear. Minimal right middle lobe atelectasis/scarring. There is mild cardiomegaly. Coronary vascular calcifications noted. No definite intra-abdominal free air or free fluid. Hepatobiliary: A 1 cm hypodense lesion in the dome of the liver is not well characterized but appears similar to prior CT, likely a cyst. The liver is otherwise unremarkable. There is mild intrahepatic biliary ductal dilatation. No calcified gallstone or pericholecystic fluid. Pancreas: Unremarkable. No pancreatic ductal dilatation or surrounding inflammatory changes. Spleen: Normal in size without focal abnormality. Adrenals/Urinary Tract: The adrenal glands are unremarkable. There is no hydronephrosis on either side. There is symmetric enhancement and excretion of contrast by both kidneys. Subcentimeter left renal hypodense focus is too small to characterize but likely represents a cyst. The urinary bladder is unremarkable. Stomach/Bowel: Extensive sigmoid diverticulosis with active inflammatory changes. There is a 9.7 x 3.7 x 3.8 cm diverticular abscess along the sigmoid colon. Postsurgical changes of gastrojejunostomy. No evidence of bowel obstruction. A fluid containing structure with pockets of air in the upper abdomen anteriorly (series 3, image 27) appears similar to the CT of 2013 and may represent the duodenal bulb. A 15 mm high attenuating structure within this may  represent a chronic stone although an enhancing  lesion is not entirely excluded. Vascular/Lymphatic: Moderate aortoiliac atherosclerotic disease. The IVC is unremarkable. No portal venous gas. There is no adenopathy. Reproductive: The uterus is grossly unremarkable. There is loss of fat plane between the superior uterus and diverticular abscess of the sigmoid colon. No adnexal masses. Other: None Musculoskeletal: Osteopenia. No acute osseous pathology. IMPRESSION: 1. Sigmoid diverticulitis with a large diverticular abscess. There is loss of fat plane between the superior uterus and diverticular abscess. 2. A 15 mm high attenuating structure in the anterior upper abdomen appears similar to prior CT and may represent the duodenal bulb. Aortic Atherosclerosis (ICD10-I70.0). Electronically Signed   By: Elgie Collard M.D.   On: 04/05/2019 14:14   Ct Image Guided Drainage By Percutaneous Catheter  Result Date: 04/06/2019 INDICATION: 68 year old female with sigmoid colonic diverticulitis and large intramural abscess. She presents for CT-guided drain placement. EXAM: CT-guided drain placement MEDICATIONS: The patient is currently admitted to the hospital and receiving intravenous antibiotics. The antibiotics were administered within an appropriate time frame prior to the initiation of the procedure. ANESTHESIA/SEDATION: Fentanyl 100 mcg IV; Versed 2 mg IV Moderate Sedation Time:  17 minutes The patient was continuously monitored during the procedure by the interventional radiology nurse under my direct supervision. COMPLICATIONS: None immediate. PROCEDURE: Informed written consent was obtained from the patient after a thorough discussion of the procedural risks, benefits and alternatives. All questions were addressed. Maximal Sterile Barrier Technique was utilized including caps, mask, sterile gowns, sterile gloves, sterile drape, hand hygiene and skin antiseptic. A timeout was performed prior to the initiation of the procedure. A planning axial CT scan was  performed. The fluid and gas collection in the left lower quadrant of the anatomic pelvis position between the sigmoid colon, uterus and bladder was successfully identified. A suitable skin entry site was selected and marked. Care was taken to ensure that the entry site was below the redundant portion of the sigmoid colon. After sterile prep and drape in the standard fashion with chlorhexidine, local anesthesia was attained by infiltration with 1% lidocaine. A small dermatotomy was made. Under intermittent CT guidance, an 18 gauge trocar needle was advanced into the fluid and gas collection. A 0.035 wire was then advanced into the fluid and gas collection. The needle was exchanged for a fascial dilator and the soft tissue tract was dilated to 12 [place]. A Cook 12 [place] all-purpose drainage catheter modified with additional sideholes was then advanced over the wire and formed. Aspiration yields approximately 80 cc of thick purulent material. A sample was sent for Gram stain and culture. The abscess cavity was lavaged with sterile saline and the drain connected to JP bulb suction. The drain was then secured to the skin with 0 Prolene suture and an adhesive fixation device. The patient tolerated the procedure well. IMPRESSION: Successful placement of a 12 French drainage catheter modified with additional sideholes. Aspiration yields 80 mL of frankly purulent and foul-smelling fluid. Samples were sent for Gram stain and culture. PLAN: Maintain drain to JP bulb suction. Flush drainage catheter at least once per shift. When drain output has decreased significantly, recommend follow-up with both contrast-enhanced CT scan of the pelvis to assess for resolution of the abscess as well as drain injection to assess for fistula with the sigmoid colon given the intramural location of the abscess. Signed, Sterling Big, MD, RPVI [facility] Electronically Signed    By: Isac Caddy.D.  On: 04/06/2019 12:42    Labs:  CBC: Recent Labs    04/04/19 1846 04/06/19 0404  WBC 17.3* 14.5*  HGB 15.3* 14.0  HCT 42.1 38.3  PLT 396 381    COAGS: Recent Labs    04/06/19 0404  INR 1.2  APTT 40*    BMP: Recent Labs    04/04/19 1846 04/06/19 0404  NA 127* 128*  K 3.4* 3.4*  CL 88* 93*  CO2 26 21*  GLUCOSE 118* 81  BUN <5* <5*  CALCIUM 9.1 8.0*  CREATININE 0.63 0.50  GFRNONAA >60 >60  GFRAA >60 >60    LIVER FUNCTION TESTS: Recent Labs    04/04/19 1846 04/06/19 1217  BILITOT 0.5 0.8  AST 19 19  ALT 16 14  ALKPHOS 95 86  PROT 6.5 6.5  ALBUMIN 3.3* 3.0*    Assessment and Plan: Diverticular abscess s/p drain placement 04/05/19 Drain in place today.  95 mL output recorded, with more in bulb during visit.  Cultures pending; growing gram positive cocci WBC improved from 17.3  14.5 Afebrile. Remains confused.  Husband at bedside.  Spoke with RN.   IR following.    Electronically Signed: Docia Barrier, PA 04/06/2019, 3:39 PM   I spent a total of 15 Minutes at the the patient's bedside AND on the patient's hospital floor or unit, greater than 50% of which was counseling/coordinating care for diverticular abscess.

## 2019-04-06 NOTE — Progress Notes (Signed)
Received pt from ED, alert but very confused. Pt with JP drain LLQ with minimal serosanguineous drainage. VSS. Oriented to room and  bed controls. Left in bed lying comfortably with bed alarm on and call bell at reach. Will continue to monitor.

## 2019-04-06 NOTE — Progress Notes (Signed)
PROGRESS NOTE                                                                                                                                                                                                             Patient Demographics:    [name], is a 68 y.o. female, DOB - February 12, 1951, ZOX:[id number]  Admit date - 04/04/2019   Admitting Physician Clydie Braun, MD  Outpatient Primary MD for the patient is Gordy Savers, MD  LOS - 1  Chief Complaint  Patient presents with  . Abdominal Pain       Brief Narrative  [name] is a 68 y.o. female with medical history significant of hypertension, hypothyroidism, alcoholism, anxiety, and duodenal ulcer perforation s/p surgical repair; presents with complaints of abdominal pain over the last 3-4 days.  Apparently patient has been confused for several months which had gotten worse in the last 3 to 4 weeks per husband, he did not seek medical help and could not give me a good reason for why he did not seek help.  I discussed with patient's husband her medical situation on 04/06/2019 according to the husband patient has been complaining of abdominal pain for the last few days prior to hospital admission, she was heavily drinking up till about 1 month ago and still drinking a few drinks till 1 week ago.  In the ER she was diagnosed with diverticular abscess, IR and general surgery were consulted and she was admitted.   Subjective:    [name] today in bed quite confused, unable to provide good history but denies any headache chest pain or shortness of breath.  Does have some abdominal discomfort.   Assessment  & Plan :     1.  Diverticular abscess.  IR consulted along with general surgery, does have a left lower quadrant JP drain which was placed on 04/05/2019.  Empiric IV antibiotics.  Follow cultures.  Soft diet, IV fluids and monitor.  2.  Alcohol abuse.  Ongoing.  No signs of DTs, CIWA protocol.  Counseled to  quit.  3.  Acute metabolic encephalopathy on top of chronic encephalopathy.  Seems to have this problem for several months, no help was sought by husband.  Will check B12, TSH, RPR, folic acid level and head CT.  Currently no focal deficits.  Will monitor.  4.  Hyponatremia and hypokalemia.  Appears dehydrated, replace potassium, gentle hydration and monitor.  5.  History of malabsorption.  Anemia panel  along with B12, folic acid levels will be ordered.    Family Communication  :  Husband 9/16  Code Status :  Full  Disposition Plan  :  TBD  Consults  :  CCS, IR  Procedures  :    CT -  1. Sigmoid diverticulitis with a large diverticular abscess. There is loss of fat plane between the superior uterus and diverticular abscess. 2. A 15 mm high attenuating structure in the anterior upper abdomen appears similar to prior CT and may represent the duodenal bulb. Aortic Atherosclerosis.   DVT Prophylaxis  :  Lovenox   Lab Results  Component Value Date   PLT 381 04/06/2019    Diet :  Diet Order            DIET SOFT Room service appropriate? Yes; Fluid consistency: Thin  Diet effective now               Inpatient Medications Scheduled Meds: . docusate sodium  200 mg Oral BID  . enoxaparin (LOVENOX) injection  40 mg Subcutaneous Q24H  . pantoprazole (PROTONIX) IV  40 mg Intravenous Q12H  . sodium chloride flush  5 mL Intracatheter Q8H   Continuous Infusions: . cefTRIAXone (ROCEPHIN)  IV    . lactated ringers with kcl 75 mL/hr at 04/06/19 0845  . metronidazole 500 mg (04/06/19 0733)   PRN Meds:.acetaminophen **OR** acetaminophen, ALPRAZolam, haloperidol lactate, hydrALAZINE, ondansetron **OR** ondansetron (ZOFRAN) IV  Antibiotics  :   Anti-infectives (From admission, onward)   Start     Dose/Rate Route Frequency Ordered Stop   04/06/19 1400  cefTRIAXone (ROCEPHIN) 2 g in sodium chloride 0.9 % 100 mL IVPB     2 g 200 mL/hr over 30 Minutes Intravenous Every 24 hours  04/05/19 1729     04/06/19 0000  metroNIDAZOLE (FLAGYL) IVPB 500 mg     500 mg 100 mL/hr over 60 Minutes Intravenous Every 8 hours 04/05/19 1729     04/05/19 1500  cefTRIAXone (ROCEPHIN) 2 g in sodium chloride 0.9 % 100 mL IVPB     2 g 200 mL/hr over 30 Minutes Intravenous  Once 04/05/19 1450 04/05/19 1547   04/05/19 1445  ceFEPIme (MAXIPIME) 2 g in sodium chloride 0.9 % 100 mL IVPB  Status:  Discontinued     2 g 200 mL/hr over 30 Minutes Intravenous Every 12 hours 04/05/19 1444 04/05/19 1450   04/05/19 1445  metroNIDAZOLE (FLAGYL) IVPB 500 mg     500 mg 100 mL/hr over 60 Minutes Intravenous  Once 04/05/19 1444 04/05/19 1659   04/05/19 0945  cephALEXin (KEFLEX) capsule 500 mg     500 mg Oral  Once 04/05/19 0937 04/05/19 0950          Objective:   Vitals:   04/06/19 0306 04/06/19 0548 04/06/19 0558 04/06/19 0634  BP: (!) 174/64 (!) 164/67 (!) 148/76   Pulse: 60 66 64   Resp: 18 17 18    Temp: 98.6 F (37 C) 97.7 F (36.5 C) 97.8 F (36.6 C)   TempSrc: Oral Oral Oral   SpO2: 97% 97% 98%   Weight:    42.7 kg  Height:    5\' 3"  (1.6 m)    Wt Readings from Last 3 Encounters:  04/06/19 42.7 kg  01/27/18 42.7 kg  10/20/17 41.3 kg     Intake/Output Summary (Last 24 hours) at 04/06/2019 1054 Last data filed at 04/06/2019 0631 Gross per 24 hour  Intake 1521.67 ml  Output 295 ml  Net 1226.67 ml     Physical Exam  Awake but confused, No new F.N deficits,   [place].AT,PERRAL Supple Neck,No JVD, No cervical lymphadenopathy appriciated.  Symmetrical Chest wall movement, Good air movement bilaterally, CTAB RRR,No Gallops,Rubs or new Murmurs, No Parasternal Heave +ve B.Sounds, Abd Soft mild abd tenderness, No organomegaly appriciated, No rebound - guarding or rigidity. No Cyanosis, Clubbing or edema, No new Rash or bruise       Data Review:    CBC Recent Labs  Lab 04/04/19 1846 04/06/19 0404  WBC 17.3* 14.5*  HGB 15.3* 14.0  HCT 42.1 38.3  PLT 396 381  MCV 93.3 92.7   MCH 33.9 33.9  MCHC 36.3* 36.6*  RDW 12.2 12.3    Chemistries  Recent Labs  Lab 04/04/19 1846 04/05/19 2000 04/06/19 0404  NA 127*  --  128*  K 3.4*  --  3.4*  CL 88*  --  93*  CO2 26  --  21*  GLUCOSE 118*  --  81  BUN <5*  --  <5*  CREATININE 0.63  --  0.50  CALCIUM 9.1  --  8.0*  MG  --  1.8  --   AST 19  --   --   ALT 16  --   --   ALKPHOS 95  --   --   BILITOT 0.5  --   --    ------------------------------------------------------------------------------------------------------------------ No results for input(s): CHOL, HDL, LDLCALC, TRIG, CHOLHDL, LDLDIRECT in the last 72 hours.  No results found for: HGBA1C ------------------------------------------------------------------------------------------------------------------ No results for input(s): TSH, T4TOTAL, T3FREE, THYROIDAB in the last 72 hours.  Invalid input(s): FREET3 ------------------------------------------------------------------------------------------------------------------ No results for input(s): VITAMINB12, FOLATE, FERRITIN, TIBC, IRON, RETICCTPCT in the last 72 hours.  Coagulation profile Recent Labs  Lab 04/06/19 0404  INR 1.2    No results for input(s): DDIMER in the last 72 hours.  Cardiac Enzymes No results for input(s): CKMB, TROPONINI, MYOGLOBIN in the last 168 hours.  Invalid input(s): CK ------------------------------------------------------------------------------------------------------------------ No results found for: BNP  Micro Results Recent Results (from the past 240 hour(s))  Urine culture     Status: Abnormal   Collection Time: 04/05/19  8:45 AM   Specimen: Urine, Random  Result Value Ref Range Status   Specimen Description URINE, RANDOM  Final   Special Requests NONE  Final   Culture (A)  Final    <10,000 COLONIES/mL INSIGNIFICANT GROWTH Performed at [facility], [address], [place], [place] [address]    Report Status 04/06/2019 FINAL  Final  SARS  Coronavirus [facility] order, Performed in [facility] hospital lab) Nasopharyngeal Nasopharyngeal Swab     Status: None   Collection Time: 04/05/19  2:45 PM   Specimen: Nasopharyngeal Swab  Result Value Ref Range Status   SARS Coronavirus 2 NEGATIVE NEGATIVE Final    Comment: (NOTE) If result is NEGATIVE SARS-CoV-2 target nucleic acids are NOT DETECTED. The SARS-CoV-2 RNA is generally detectable in upper and lower  respiratory specimens during the acute phase of infection. The lowest  concentration of SARS-CoV-2 viral copies this assay can detect is 250  copies / mL. A negative result does not preclude SARS-CoV-2 infection  and should not be used as the sole basis for treatment or other  patient management decisions.  A negative result may occur with  improper specimen collection / handling, submission of specimen other  than nasopharyngeal swab, presence of viral mutation(s) within the  areas targeted by this assay, and inadequate number  of viral copies  (<250 copies / mL). A negative result must be combined with clinical  observations, patient history, and epidemiological information. If result is POSITIVE SARS-CoV-2 target nucleic acids are DETECTED. The SARS-CoV-2 RNA is generally detectable in upper and lower  respiratory specimens dur ing the acute phase of infection.  Positive  results are indicative of active infection with SARS-CoV-2.  Clinical  correlation with patient history and other diagnostic information is  necessary to determine patient infection status.  Positive results do  not rule out bacterial infection or co-infection with other viruses. If result is PRESUMPTIVE POSTIVE SARS-CoV-2 nucleic acids MAY BE PRESENT.   A presumptive positive result was obtained on the submitted specimen  and confirmed on repeat testing.  While 2019 novel coronavirus  (SARS-CoV-2) nucleic acids may be present in the submitted sample  additional confirmatory testing may be necessary  for epidemiological  and / or clinical management purposes  to differentiate between  SARS-CoV-2 and other Sarbecovirus currently known to infect humans.  If clinically indicated additional testing with an alternate test  methodology [phone]) is advised. The SARS-CoV-2 RNA is generally  detectable in upper and lower respiratory sp ecimens during the acute  phase of infection. The expected result is Negative. Fact Sheet for Patients:  BoilerBrush.com.cy Fact Sheet for Healthcare Providers: https://pope.com/ This test is not yet approved or cleared by the [place] FDA and has been authorized for detection and/or diagnosis of SARS-CoV-2 by FDA under an Emergency Use Authorization (EUA).  This EUA will remain in effect (meaning this test can be used) for the duration of the COVID-19 declaration under Section 564(b)(1) of the Act, 21 U.S.C. section 360bbb-3(b)(1), unless the authorization is terminated or revoked sooner. Performed at [facility] Lab, [address] [place], [place] [address]   Aerobic/Anaerobic Culture (surgical/deep wound)     Status: None (Preliminary result)   Collection Time: 04/05/19  6:49 PM   Specimen: Abscess  Result Value Ref Range Status   Specimen Description ABSCESS  Final   Special Requests Normal  Final   Gram Stain   Final    FEW WBC PRESENT, PREDOMINANTLY PMN MODERATE GRAM POSITIVE COCCI Performed at [facility], [address], [place], [place] [address]    Culture PENDING  Incomplete   Report Status PENDING  Incomplete    Radiology Reports Ct Abdomen Pelvis W Contrast  Result Date: 04/05/2019 CLINICAL DATA:  68 year old female with abdominal pain. History of prior perforated duodenal ulcer and exclude Terri laparotomy and pyloroplasty. EXAM: CT ABDOMEN AND PELVIS WITH CONTRAST TECHNIQUE: Multidetector CT imaging of the abdomen and pelvis was performed using the standard protocol  following bolus administration of intravenous contrast. CONTRAST:  OMNIPAQUE IOHEXOL 300 MG/ML  SOLN COMPARISON:  CT of the abdomen pelvis dated 08/05/2011. FINDINGS: Evaluation of this exam is limited due to respiratory motion artifact. Lower chest: The visualized lung bases are clear. Minimal right middle lobe atelectasis/scarring. There is mild cardiomegaly. Coronary vascular calcifications noted. No definite intra-abdominal free air or free fluid. Hepatobiliary: A 1 cm hypodense lesion in the dome of the liver is not well characterized but appears similar to prior CT, likely a cyst. The liver is otherwise unremarkable. There is mild intrahepatic biliary ductal dilatation. No calcified gallstone or pericholecystic fluid. Pancreas: Unremarkable. No pancreatic ductal dilatation or surrounding inflammatory changes. Spleen: Normal in size without focal abnormality. Adrenals/Urinary Tract: The adrenal glands are unremarkable. There is no hydronephrosis on either side. There is symmetric  enhancement and excretion of contrast by both kidneys. Subcentimeter left renal hypodense focus is too small to characterize but likely represents a cyst. The urinary bladder is unremarkable. Stomach/Bowel: Extensive sigmoid diverticulosis with active inflammatory changes. There is a 9.7 x 3.7 x 3.8 cm diverticular abscess along the sigmoid colon. Postsurgical changes of gastrojejunostomy. No evidence of bowel obstruction. A fluid containing structure with pockets of air in the upper abdomen anteriorly (series 3, image 27) appears similar to the CT of 2013 and may represent the duodenal bulb. A 15 mm high attenuating structure within this may represent a chronic stone although an enhancing lesion is not entirely excluded. Vascular/Lymphatic: Moderate aortoiliac atherosclerotic disease. The IVC is unremarkable. No portal venous gas. There is no adenopathy. Reproductive: The uterus is grossly unremarkable. There is loss of fat  plane between the superior uterus and diverticular abscess of the sigmoid colon. No adnexal masses. Other: None Musculoskeletal: Osteopenia. No acute osseous pathology. IMPRESSION: 1. Sigmoid diverticulitis with a large diverticular abscess. There is loss of fat plane between the superior uterus and diverticular abscess. 2. A 15 mm high attenuating structure in the anterior upper abdomen appears similar to prior CT and may represent the duodenal bulb. Aortic Atherosclerosis (ICD10-I70.0). Electronically Signed   By: Elgie Collard M.D.   On: 04/05/2019 14:14    Time Spent in minutes  30   Susa Raring M.D on 04/06/2019 at 10:54 AM  To page go to [email or web address] - password [facility]

## 2019-04-06 NOTE — Progress Notes (Signed)
Chaplain was standing at the [place] beside the location where [name]'s bed was placed in the hall. With [name] being older and confused, the sudden presence of a number of police officers and the rapid response team startled her. Chaplain kept track of the truama while also reassuring [name] that everything was okay and it would be best to remain in her bed. Chaplain offered conversation and extra blankets. Chaplain answered [name]'s questions while avoiding actual trauma pt information. [name] remained pleasant and in bed. Chaplain wished her a good night. Chaplain remains available per request.  Chaplain Resident, Evelene Croon, M Div Pager # [phone]

## 2019-04-06 NOTE — Progress Notes (Addendum)
CC: Abdominal pain  Subjective:  Is really confused this morning.  Staff had her husband come in.  He says she has been this way over the last 2 weeks.  She has become progressively more confused, she did stop drinking, she is not eating at all at home.  I think right now is the worst he has seen her.  She is hitting both me and the lab tech who is here to draw a bunch of blood.  I have reordered most of the labs can be done off the tubes drawn earlier, the rest will have to be done tomorrow.    The best I can tell her abdominal exam remains benign.  There is some serosanguineous drainage in the JP.  It is empty so I cannot really tell if that is old or new.  They drained 80cc  with the initial drain placement and there is 95cc listed under the I&O.  I do not note that a total or additional 95 cc.  Objective: Vital signs in last 24 hours: Temp:  [97.7 F (36.5 C)-98.6 F (37 C)] 97.8 F (36.6 C) (09/16 0558) Pulse Rate:  [60-72] 64 (09/16 0558) Resp:  [10-20] 18 (09/16 0558) BP: (131-174)/(61-76) 148/76 (09/16 0558) SpO2:  [96 %-100 %] 98 % (09/16 0558) Weight:  [42.7 kg] 42.7 kg (09/16 0634) Last BM Date: (PTA) 1521 IV 200 urine recorded 95 drain Afebrile, BP stable Na 128, K+ 3.4 WBC 14.5 Prealbumin <5   Intake/Output from previous day: 09/15 0701 - 09/16 0700 In: 1521.7 [I.V.:321.7; IV Piggyback:1200] Out: 295 [Urine:200] Intake/Output this shift: No intake/output data recorded.  General appearance: alert, combative and She is significantly confused.  Worse than yesterday evening.  She is telling her husband is she is going to divorce him Resp: clear to auscultation bilaterally GI: Soft it is hard to examine her she is kind of in a fetal position she does not seem to be tender.  Minimal fluid in the JP drain.  Lab Results:  Recent Labs    04/04/19 1846 04/06/19 0404  WBC 17.3* 14.5*  HGB 15.3* 14.0  HCT 42.1 38.3  PLT 396 381    BMET Recent Labs     04/04/19 1846 04/06/19 0404  NA 127* 128*  K 3.4* 3.4*  CL 88* 93*  CO2 26 21*  GLUCOSE 118* 81  BUN <5* <5*  CREATININE 0.63 0.50  CALCIUM 9.1 8.0*   PT/INR Recent Labs    04/06/19 0404  LABPROT 15.0  INR 1.2    Recent Labs  Lab 04/04/19 1846  AST 19  ALT 16  ALKPHOS 95  BILITOT 0.5  PROT 6.5  ALBUMIN 3.3*     Lipase     Component Value Date/Time   LIPASE 17 04/04/2019 1846     Medications: . aspirin  81 mg Oral Daily  . docusate sodium  200 mg Oral BID  . enoxaparin (LOVENOX) injection  40 mg Subcutaneous Q24H  . folic acid  1 mg Oral Daily  . LORazepam  0-4 mg Intravenous Q6H   Followed by  . [START ON 04/08/2019] LORazepam  0-4 mg Intravenous Q12H  . multivitamin with minerals  1 tablet Oral Daily  . pantoprazole (PROTONIX) IV  40 mg Intravenous Q12H  . sodium chloride flush  5 mL Intracatheter Q8H  . thiamine  100 mg Oral Daily   Or  . thiamine  100 mg Intravenous Daily   . cefTRIAXone (ROCEPHIN)  IV    .  lactated ringers with kcl 75 mL/hr at 04/06/19 0845  . 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 Ovidio Kinavid Lamaj Metoyer Hx perforated ulcer with exploratory laparotomy, duodenal exclusion, gastrojejunostomy and feeding jejunostomy, Dr. Lindie SpruceWyatt, 08/03/2004. IR drain placement 9/15 80 ml purulent fluid drained  FEN: N.p.o./IV fluids ID: Rocephin/Flagyl -penicillin allergy DVT: Can start after IR procedure  Plan: I am going to have them bring her some clear liquid supplements from the floor, along with  ice chips and see how she does.  LOS: 1 day    JENNINGS,WILLARD 04/06/2019 [phone]  Agree with above. I revised a gastro-jejunostomy on her in Jan 2013. Her husband, Cephus ShellingCharles Lenzen, is at the bedside.  He said that she has gotten worse over the last 7 to 10 days.   She is confused and arguing with her husband.  But her abdomen is soft and has few BS. Drain in the LLQ - 95 cc recorded over last 24 hours.  Ovidio Kinavid Maurissa Ambrose, MD, [facility] Pager: [phone] Office phone:  [phone]

## 2019-04-06 NOTE — Evaluation (Signed)
Physical Therapy Evaluation Patient Details Name: [name] MRN: [id number] DOB: Mar 22, 1951 Today's Date: 04/06/2019   History of Present Illness  Pt is a 68 y.o. female admitted 04/04/19 with abdominal pain. CT showed large diverticular abscess. Pt s/p drain placement 9/15. Worsening confusion since admission and over past few weeks per husband. PMH includes diverticulosis, duodenal ulcer performation (s/p repair 2006), Raynaud's, osteoporosis, anxiety, alcoholism.    Clinical Impression  Patient evaluated by Physical Therapy with no further acute PT needs identified. Prior to admission patient performing all mobility independently. Pt without deficits in mobility at this time although cognitive impairments present compared to baseline per spouse report. Spouse available for 24/7 supervision/assistance at home. Pt and spouse encouraged to ambulate around the unit multiple times per day with supervision of 1 person to assist. All education has been completed and the patient has no further questions. No PT or DME needs at discharge. PT is signing off. Thank you for this referral.     Follow Up Recommendations No PT follow up    Equipment Recommendations  None recommended by PT    Recommendations for Other Services       Precautions / Restrictions Precautions Precautions: Fall Precaution Comments: Supervision for hallway ambulation Restrictions Weight Bearing Restrictions: No      Mobility  Bed Mobility Overal bed mobility: Independent                Transfers Overall transfer level: Independent Equipment used: None                Ambulation/Gait Ambulation/Gait assistance: Supervision Gait Distance (Feet): 200 Feet Assistive device: None Gait Pattern/deviations: WFL(Within Functional Limits) Gait velocity: functional for household/limited community ambulation   General Gait Details: unremarkable gait with no significant gait or balance  deviations  Stairs Stairs: Yes Stairs assistance: Supervision Stair Management: No rails Number of Stairs: 2    Wheelchair Mobility    Modified Rankin (Stroke Patients Only)       Balance Overall balance assessment: Needs assistance Sitting-balance support: No upper extremity supported;Feet supported Sitting balance-Leahy Scale: Good     Standing balance support: No upper extremity supported Standing balance-Leahy Scale: Good                               Pertinent Vitals/Pain Pain Assessment: No/denies pain    Home Living Family/patient expects to be discharged to:: Private residence Living Arrangements: Spouse/significant other Available Help at Discharge: Family Type of Home: House Home Access: Stairs to enter Entrance Stairs-Rails: None Entrance Stairs-Number of Steps: 1 Home Layout: Able to live on main level with bedroom/bathroom Home Equipment: None      Prior Function Level of Independence: Independent               Hand Dominance        Extremity/Trunk Assessment   Upper Extremity Assessment Upper Extremity Assessment: Overall WFL for tasks assessed    Lower Extremity Assessment Lower Extremity Assessment: Overall WFL for tasks assessed    Cervical / Trunk Assessment Cervical / Trunk Assessment: Normal  Communication   Communication: No difficulties  Cognition Arousal/Alertness: Awake/alert Behavior During Therapy: WFL for tasks assessed/performed Overall Cognitive Status: Impaired/Different from baseline Area of Impairment: Orientation;Memory                 Orientation Level: Disoriented to;Time  General Comments      Exercises     Assessment/Plan    PT Assessment Patent does not need any further PT services  PT Problem List         PT Treatment Interventions      PT Goals (Current goals can be found in the Care Plan section)  Acute Rehab PT Goals PT Goal Formulation:  All assessment and education complete, DC therapy    Frequency     Barriers to discharge        Co-evaluation               AM-PAC PT "6 Clicks" Mobility  Outcome Measure Help needed turning from your back to your side while in a flat bed without using bedrails?: None Help needed moving from lying on your back to sitting on the side of a flat bed without using bedrails?: None Help needed moving to and from a bed to a chair (including a wheelchair)?: None Help needed standing up from a chair using your arms (e.g., wheelchair or bedside chair)?: None Help needed to walk in hospital room?: None Help needed climbing 3-5 steps with a railing? : None 6 Click Score: 24    End of Session Equipment Utilized During Treatment: Gait belt Activity Tolerance: Patient tolerated treatment well Patient left: in bed Nurse Communication: Mobility status PT Visit Diagnosis: Other abnormalities of gait and mobility (R26.89)    Time: 9147-8295 PT Time Calculation (min) (ACUTE ONLY): 25 min   Charges:   PT Evaluation $PT Eval Low Complexity: 1 Low PT Treatments $Gait Training: 8-22 mins        Ina Homes, PT, DPT Acute Rehabilitation Services  Pager [phone] Office [phone]    Malachy Chamber 04/06/2019, 2:51 PM

## 2019-04-06 NOTE — Progress Notes (Signed)
Initial Nutrition Assessment  DOCUMENTATION CODES:   Underweight  INTERVENTION:   -Boost Breeze po TID, each supplement provides 250 kcal and 9 grams of protein -MVI with minerals daily -RD will follow for diet advancement and adjust supplement regimen as appropriate  NUTRITION DIAGNOSIS:   Inadequate oral intake related to altered GI function as evidenced by meal completion < 25%.  GOAL:   Patient will meet greater than or equal to 90% of their needs  MONITOR:   PO intake, Supplement acceptance, Diet advancement, Labs, Weight trends, Skin, I & O's  REASON FOR ASSESSMENT:   Malnutrition Screening Tool, Consult Assessment of nutrition requirement/status  ASSESSMENT:   [name] is a 68 y.o. female with medical history significant of hypertension, hypothyroidism, alcoholism, anxiety, and duodenal ulcer perforation s/p surgical repair; presents with complaints of abdominal pain over the last 3-4 days.  History is obtained from the patient's husband over the phone as the patient is confused.  He reports at baseline she has been confused for quite some time and he needs to have her formally evaluated.  Pain is located in the left lower quadrant of her abdomen.  She had reportedly gotten sick 2 weeks ago and had thrown up once. Since that time he had put her on a soft diet.  She has been doing okay, but over the last last week had stopped eating altogether.  Associated symptoms include complaints of headache.  She denies having any other complaints at this time.  Husband denies any knowledge of any blood in stool, fever, or chest pain symptoms.  He is not on blood thinners.  She has not drank alcohol in quite some time.  Pt admitted with sigmoid diverticulitis with large abscess.   9/15- s/p diverticular abscess drain placement (apiration yielded 80 ml frank pus)  Reviewed I/O's: +1.2 L x 24 hours   UOP: 200 ml x 24 hours  Drain output: 95 ml x 24 hours  RD attempted to visit  pt x 2, however pt either working with therapy or using bathroom at time of visit.   Case discussed with RN and physical therapy, who reports pt has been confused, however, behavior has improved now that husband has come to visit her.   Pt now NPO with sips of clear on the floor and general surgery requesting RD to add supplements. Per chart review, pt has had decreased oral intake over the past 1-2 weeks. General surgery notes that pt may require TPN if oral intake does not improve.   Reviewed wt hx; noted wt has been stable over the past 3 years. Pt has history of malnutrition from prior hospitalizations and RD visits. Suspect malnutrition is ongoing, however, RD unable to identify at this time.   Medications reviewed and include lactated ringers with KCL infuson @ 75 ml/hr.   Labs reviewed: Na: 128, K: 3.4.  Diet Order:   Diet Order            Diet NPO time specified Except for: Ice Chips, Sips with Meds, Other (See Comments)  Diet effective now              EDUCATION NEEDS:   No education needs have been identified at this time  Skin:  Skin Assessment: Reviewed RN Assessment  Last BM:  PTA  Height:   Ht Readings from Last 1 Encounters:  04/06/19 5\' 3"  (1.6 m)    Weight:   Wt Readings from Last 1 Encounters:  04/06/19 42.7 kg  Ideal Body Weight:  52.3 kg  BMI:  Body mass index is 16.68 kg/m.  Estimated Nutritional Needs:   Kcal:  1700-1900  Protein:  85-100 grams  Fluid:  > 1.7 L    [name], RD, LDN, [place] Registered Dietitian II Certified Diabetes Care and Education Specialist Pager: [phone] After hours Pager: [phone]

## 2019-04-06 NOTE — Progress Notes (Signed)
Patient very confused,agitateddisconnected JP bulb, trying to get OOB unassisted despite re-education.

## 2019-04-07 ENCOUNTER — Inpatient Hospital Stay (HOSPITAL_COMMUNITY): Payer: PPO

## 2019-04-07 DIAGNOSIS — R4182 Altered mental status, unspecified: Secondary | ICD-10-CM

## 2019-04-07 LAB — FOLATE: Folate: 11.5 ng/mL (ref >5.9)

## 2019-04-07 LAB — COMPREHENSIVE METABOLIC PANEL
ALT: 13 U/L (ref 0–44)
AST: 18 U/L (ref 15–41)
Albumin: 2.9 g/dL — ABNORMAL LOW (ref 3.5–5.0)
Alkaline Phosphatase: 84 U/L (ref 38–126)
Anion gap: 10 (ref 5–15)
BUN: 5 mg/dL — ABNORMAL LOW (ref 8–23)
CO2: 23 mmol/L (ref 22–32)
Calcium: 7.8 mg/dL — ABNORMAL LOW (ref 8.9–10.3)
Chloride: 96 mmol/L — ABNORMAL LOW (ref 98–111)
Creatinine, Ser: 0.53 mg/dL (ref 0.44–1.00)
GFR calc Af Amer: >60 mL/min (ref >60)
GFR calc non Af Amer: >60 mL/min (ref >60)
Glucose, Bld: 139 mg/dL — ABNORMAL HIGH (ref 70–99)
Potassium: 3.5 mmol/L (ref 3.5–5.1)
Sodium: 129 mmol/L — ABNORMAL LOW (ref 135–145)
Total Bilirubin: 0.4 mg/dL (ref 0.3–1.2)
Total Protein: 5.8 g/dL — ABNORMAL LOW (ref 6.5–8.1)

## 2019-04-07 LAB — CBC WITH DIFFERENTIAL/PLATELET
Abs Immature Granulocytes: 0.09 10*3/uL — ABNORMAL HIGH (ref 0.00–0.07)
Basophils Absolute: 0.1 10*3/uL (ref 0.0–0.1)
Basophils Relative: 1 %
Eosinophils Absolute: 0 10*3/uL (ref 0.0–0.5)
Eosinophils Relative: 0 %
HCT: 37.6 % (ref 36.0–46.0)
Hemoglobin: 13.3 g/dL (ref 12.0–15.0)
Immature Granulocytes: 1 %
Lymphocytes Relative: 13 %
Lymphs Abs: 1.2 10*3/uL (ref 0.7–4.0)
MCH: 33 pg (ref 26.0–34.0)
MCHC: 35.4 g/dL (ref 30.0–36.0)
MCV: 93.3 fL (ref 80.0–100.0)
Monocytes Absolute: 1.1 10*3/uL — ABNORMAL HIGH (ref 0.1–1.0)
Monocytes Relative: 11 %
Neutro Abs: 7.1 10*3/uL (ref 1.7–7.7)
Neutrophils Relative %: 74 %
Platelets: 406 10*3/uL — ABNORMAL HIGH (ref 150–400)
RBC: 4.03 MIL/uL (ref 3.87–5.11)
RDW: 12.1 % (ref 11.5–15.5)
WBC: 9.6 10*3/uL (ref 4.0–10.5)
nRBC: 0 % (ref 0.0–0.2)

## 2019-04-07 LAB — MAGNESIUM: Magnesium: 1.8 mg/dL (ref 1.7–2.4)

## 2019-04-07 LAB — HEMOGLOBIN A1C
Hgb A1c MFr Bld: 5.8 % — ABNORMAL HIGH (ref 4.8–5.6)
Mean Plasma Glucose: 120 mg/dL

## 2019-04-07 LAB — FERRITIN: Ferritin: 139 ng/mL (ref 11–307)

## 2019-04-07 LAB — C-REACTIVE PROTEIN: CRP: 11.7 mg/dL — ABNORMAL HIGH (ref <1.0)

## 2019-04-07 LAB — RPR: RPR Ser Ql: NONREACTIVE

## 2019-04-07 MED ORDER — THIAMINE HCL 100 MG/ML IJ SOLN
500.0000 mg | Freq: Every day | INTRAVENOUS | Status: AC
  Administered 2019-04-07 – 2019-04-11 (×5): 500 mg via INTRAVENOUS
  Filled 2019-04-07 (×6): qty 5

## 2019-04-07 MED ORDER — VITAMIN B-1 100 MG PO TABS
100.0000 mg | ORAL_TABLET | Freq: Every day | ORAL | Status: DC
  Administered 2019-04-13 – 2019-04-14 (×2): 100 mg via ORAL
  Filled 2019-04-07 (×2): qty 1

## 2019-04-07 NOTE — Progress Notes (Signed)
SLP Cancellation Note  Patient Details Name: [name] MRN: [id number] DOB: [birth year]   Cancelled treatment:        Attempted to see pt in am and she was sleeping. Pt's husband asked me not to wake because she had been awake all night. Second attempt at 14:45. Pt would wake up , had been given ativan for MRI prior. Will attempt again tomorrow.   Charlynne Cousins [name], MA, CCC-SLP 04/07/2019 3:06 PM

## 2019-04-07 NOTE — Progress Notes (Signed)
PROGRESS NOTE                                                                                                                                                                                                             Patient Demographics:    Kirsten LoserDiane Rivera, is a 68 y.o. female, DOB - [birth year], FAO:[id number]  Admit date - 04/04/2019   Admitting Physician Clydie Braunondell A Smith, MD  Outpatient Primary MD for the patient is Gordy SaversKwiatkowski, Peter F, MD  LOS - 2  Chief Complaint  Patient presents with   Abdominal Pain       Brief Narrative  Kirsten HarmanDiane M Rivera is a 68 y.o. female with medical history significant of hypertension, hypothyroidism, alcoholism, anxiety, and duodenal ulcer perforation s/p surgical repair; presents with complaints of abdominal pain over the last 3-4 days.  Apparently patient has been confused for several months which had gotten worse in the last 3 to 4 weeks per husband, he did not seek medical help and could not give me a good reason for why he did not seek help.  I discussed with patient's husband her medical situation on 04/06/2019 according to the husband patient has been complaining of abdominal pain for the last few days prior to hospital admission, she was heavily drinking up till about 1 month ago and still drinking a few drinks till 1 week ago.  In the ER she was diagnosed with diverticular abscess, IR and general surgery were consulted and she was admitted.   Subjective:   Patient walking in the room with nurses assistance in no apparent discomfort but confused, denies any chest or abdominal pain.  No shortness of breath.   Assessment  & Plan :     1.  Diverticular abscess.  IR consulted along with general surgery, does have a left lower quadrant JP drain which was placed on 04/05/2019.  Empiric IV antibiotics.  Follow cultures.  Soft diet, IV fluids and monitor.  2.  Alcohol abuse.  Ongoing.  No signs of DTs, CIWA protocol.  Counseled to quit.  3.  Acute  metabolic encephalopathy on top of chronic encephalopathy with previous history of CVA.  Seems to have this problem for several months, no help was sought by husband.  Stable B12, TSH, nonacute CT head showing old left frontal CVA.  Will check MRI brain along with EEG along with pending folic acid levels and RPR.  For now IV thiamine as this could be Wernicke's encephalopathy with her longstanding history  of alcohol abuse.  4.  Hyponatremia and hypokalemia.  Has been hydrated, potassium replaced continue gentle IV fluids.  5.  History of malabsorption. Stable anemia panel along with B12, folic acid levels pending.    Family Communication  :  Husband 9/16  Code Status :  Full  Disposition Plan  :  TBD  Consults  :  CCS, IR  Procedures  :    MRI  EEG  CT -  1. Sigmoid diverticulitis with a large diverticular abscess. There is loss of fat plane between the superior uterus and diverticular abscess. 2. A 15 mm high attenuating structure in the anterior upper abdomen appears similar to prior CT and may represent the duodenal bulb. Aortic Atherosclerosis.   DVT Prophylaxis  :  Lovenox   Lab Results  Component Value Date   PLT 406 (H) 04/07/2019    Diet :  Diet Order            Diet NPO time specified Except for: Ice Chips, Sips with Meds, Other (See Comments)  Diet effective now               Inpatient Medications Scheduled Meds:  aspirin  81 mg Oral Daily   docusate sodium  200 mg Oral BID   enoxaparin (LOVENOX) injection  40 mg Subcutaneous Q24H   feeding supplement  1 Container Oral TID BM   folic acid  1 mg Oral Daily   LORazepam  0-4 mg Intravenous Q6H   Followed by   Melene Muller ON 04/08/2019] LORazepam  0-4 mg Intravenous Q12H   multivitamin with minerals  1 tablet Oral Daily   pantoprazole (PROTONIX) IV  40 mg Intravenous Q12H   sodium chloride flush  5 mL Intracatheter Q8H   [START ON 04/13/2019] thiamine  100 mg Oral Daily   Continuous Infusions:   cefTRIAXone (ROCEPHIN)  IV 2 g (04/06/19 1313)   lactated ringers with kcl 75 mL/hr at 04/07/19 0300   metronidazole Stopped (04/07/19 0020)   thiamine injection     PRN Meds:.acetaminophen **OR** acetaminophen, haloperidol lactate, hydrALAZINE, LORazepam **OR** LORazepam, ondansetron **OR** ondansetron (ZOFRAN) IV  Antibiotics  :   Anti-infectives (From admission, onward)   Start     Dose/Rate Route Frequency Ordered Stop   04/06/19 1400  cefTRIAXone (ROCEPHIN) 2 g in sodium chloride 0.9 % 100 mL IVPB     2 g 200 mL/hr over 30 Minutes Intravenous Every 24 hours 04/05/19 1729     04/06/19 0000  metroNIDAZOLE (FLAGYL) IVPB 500 mg     500 mg 100 mL/hr over 60 Minutes Intravenous Every 8 hours 04/05/19 1729     04/05/19 1500  cefTRIAXone (ROCEPHIN) 2 g in sodium chloride 0.9 % 100 mL IVPB     2 g 200 mL/hr over 30 Minutes Intravenous  Once 04/05/19 1450 04/05/19 1547   04/05/19 1445  ceFEPIme (MAXIPIME) 2 g in sodium chloride 0.9 % 100 mL IVPB  Status:  Discontinued     2 g 200 mL/hr over 30 Minutes Intravenous Every 12 hours 04/05/19 1444 04/05/19 1450   04/05/19 1445  metroNIDAZOLE (FLAGYL) IVPB 500 mg     500 mg 100 mL/hr over 60 Minutes Intravenous  Once 04/05/19 1444 04/05/19 1659   04/05/19 0945  cephALEXin (KEFLEX) capsule 500 mg     500 mg Oral  Once 04/05/19 0937 04/05/19 0950          Objective:   Vitals:   04/06/19 1602 04/06/19 2228 04/07/19 0456 04/07/19  0503  BP: (!) 165/78 (!) 146/57 (!) 172/84 (!) 172/84  Pulse: 70 79 61 61  Resp: 16 17  18   Temp: 97.8 F (36.6 C) 98.1 F (36.7 C)  97.6 F (36.4 C)  TempSrc: Oral Oral  Oral  SpO2: 100% 99%  100%  Weight:      Height:        Wt Readings from Last 3 Encounters:  04/06/19 42.7 kg  01/27/18 42.7 kg  10/20/17 41.3 kg     Intake/Output Summary (Last 24 hours) at 04/07/2019 1124 Last data filed at 04/07/2019 0943 Gross per 24 hour  Intake 1506.31 ml  Output 1735 ml  Net -228.69 ml     Physical  Exam  Awake but confused, No new F.N deficits,   Leith.AT,PERRAL Supple Neck,No JVD, No cervical lymphadenopathy appriciated.  Symmetrical Chest wall movement, Good air movement bilaterally, CTAB RRR,No Gallops, Rubs or new Murmurs, No Parasternal Heave +ve B.Sounds, Abd Soft, No tenderness, left lower quadrant JP drain in place with serosanguineous discharge. No Cyanosis, Clubbing or edema, No new Rash or bruise     Data Review:    CBC Recent Labs  Lab 04/04/19 1846 04/06/19 0404 04/07/19 0212  WBC 17.3* 14.5* 9.6  HGB 15.3* 14.0 13.3  HCT 42.1 38.3 37.6  PLT 396 381 406*  MCV 93.3 92.7 93.3  MCH 33.9 33.9 33.0  MCHC 36.3* 36.6* 35.4  RDW 12.2 12.3 12.1  LYMPHSABS  --   --  1.2  MONOABS  --   --  1.1*  EOSABS  --   --  0.0  BASOSABS  --   --  0.1    Chemistries  Recent Labs  Lab 04/04/19 1846 04/05/19 2000 04/06/19 0404 04/06/19 1217 04/07/19 0212  NA 127*  --  128*  --  129*  K 3.4*  --  3.4*  --  3.5  CL 88*  --  93*  --  96*  CO2 26  --  21*  --  23  GLUCOSE 118*  --  81  --  139*  BUN <5*  --  <5*  --  5*  CREATININE 0.63  --  0.50  --  0.53  CALCIUM 9.1  --  8.0*  --  7.8*  MG  --  1.8  --  1.8 1.8  AST 19  --   --  19 18  ALT 16  --   --  14 13  ALKPHOS 95  --   --  86 84  BILITOT 0.5  --   --  0.8 0.4   ------------------------------------------------------------------------------------------------------------------ Recent Labs    04/06/19 1230  CHOL 163  HDL 41  LDLCALC 108*  TRIG 69  CHOLHDL 4.0    Lab Results  Component Value Date   HGBA1C 5.8 (H) 04/06/2019   ------------------------------------------------------------------------------------------------------------------ Recent Labs    04/06/19 1230  TSH 2.446   ------------------------------------------------------------------------------------------------------------------ Recent Labs    04/06/19 1217  VITAMINB12 1,840*  TIBC 217*  IRON 49  RETICCTPCT 1.0     Coagulation profile Recent Labs  Lab 04/06/19 0404  INR 1.2    No results for input(s): DDIMER in the last 72 hours.  Cardiac Enzymes No results for input(s): CKMB, TROPONINI, MYOGLOBIN in the last 168 hours.  Invalid input(s): CK ------------------------------------------------------------------------------------------------------------------ No results found for: BNP  Micro Results Recent Results (from the past 240 hour(s))  Urine culture     Status: Abnormal   Collection Time: 04/05/19  8:45 AM  Specimen: Urine, Random  Result Value Ref Range Status   Specimen Description URINE, RANDOM  Final   Special Requests NONE  Final   Culture (A)  Final    <10,000 COLONIES/mL INSIGNIFICANT GROWTH Performed at [facility], [place] [address] [place], [place] [address]    Report Status 04/06/2019 FINAL  Final  SARS Coronavirus 2 [facility] order, Performed in [facility] hospital lab) Nasopharyngeal Nasopharyngeal Swab     Status: None   Collection Time: 04/05/19  2:45 PM   Specimen: Nasopharyngeal Swab  Result Value Ref Range Status   SARS Coronavirus 2 NEGATIVE NEGATIVE Final    Comment: (NOTE) If result is NEGATIVE SARS-CoV-2 target nucleic acids are NOT DETECTED. The SARS-CoV-2 RNA is generally detectable in upper and lower  respiratory specimens during the acute phase of infection. The lowest  concentration of SARS-CoV-2 viral copies this assay can detect is 250  copies / mL. A negative result does not preclude SARS-CoV-2 infection  and should not be used as the sole basis for treatment or other  patient management decisions.  A negative result may occur with  improper specimen collection / handling, submission of specimen other  than nasopharyngeal swab, presence of viral mutation(s) within the  areas targeted by this assay, and inadequate number of viral copies  (<250 copies / mL). A negative result must be combined with clinical  observations, patient history,  and epidemiological information. If result is POSITIVE SARS-CoV-2 target nucleic acids are DETECTED. The SARS-CoV-2 RNA is generally detectable in upper and lower  respiratory specimens dur ing the acute phase of infection.  Positive  results are indicative of active infection with SARS-CoV-2.  Clinical  correlation with patient history and other diagnostic information is  necessary to determine patient infection status.  Positive results do  not rule out bacterial infection or co-infection with other viruses. If result is PRESUMPTIVE POSTIVE SARS-CoV-2 nucleic acids MAY BE PRESENT.   A presumptive positive result was obtained on the submitted specimen  and confirmed on repeat testing.  While 2019 novel coronavirus  (SARS-CoV-2) nucleic acids may be present in the submitted sample  additional confirmatory testing may be necessary for epidemiological  and / or clinical management purposes  to differentiate between  SARS-CoV-2 and other Sarbecovirus currently known to infect humans.  If clinically indicated additional testing with an alternate test  methodology [phone]) is advised. The SARS-CoV-2 RNA is generally  detectable in upper and lower respiratory sp ecimens during the acute  phase of infection. The expected result is Negative. Fact Sheet for Patients:  StrictlyIdeas.no Fact Sheet for Healthcare Providers: BankingDealers.co.za This test is not yet approved or cleared by the [place] FDA and has been authorized for detection and/or diagnosis of SARS-CoV-2 by FDA under an Emergency Use Authorization (EUA).  This EUA will remain in effect (meaning this test can be used) for the duration of the COVID-19 declaration under Section 564(b)(1) of the Act, 21 U.S.C. section 360bbb-3(b)(1), unless the authorization is terminated or revoked sooner. Performed at [facility], [place] [address], [place], [place] [address]    Aerobic/Anaerobic Culture (surgical/deep wound)     Status: None (Preliminary result)   Collection Time: 04/05/19  6:49 PM   Specimen: Abscess  Result Value Ref Range Status   Specimen Description ABSCESS  Final   Special Requests Normal  Final   Gram Stain   Final    FEW WBC PRESENT, PREDOMINANTLY PMN MODERATE GRAM POSITIVE COCCI  Culture   Final    NO GROWTH < 24 HOURS Performed at [facility], [address] [place], [place] [address]    Report Status PENDING  Incomplete    Radiology Reports Ct Head Wo Contrast  Result Date: 04/06/2019 CLINICAL DATA:  Altered level of consciousness today. EXAM: CT HEAD WITHOUT CONTRAST TECHNIQUE: Contiguous axial images were obtained from the base of the skull through the vertex without intravenous contrast. COMPARISON:  Head CT scan 06/04/2014. FINDINGS: Brain: No evidence of acute infarction, hemorrhage, hydrocephalus, extra-axial collection or mass lesion/mass effect. Chronic microvascular ischemic change has markedly worsened since the prior head CT. Left frontal infarct is remote but new since the prior CT. Vascular: No hyperdense vessel or unexpected calcification. Skull: Intact.  No focal lesion. Sinuses/Orbits: Negative. Other: None. IMPRESSION: No acute abnormality. Extensive chronic microvascular ischemic change. Remote left frontal infarct noted. Electronically Signed   By: Drusilla Kannerhomas  Dalessio M.D.   On: 04/06/2019 13:34   Ct Abdomen Pelvis W Contrast  Result Date: 04/05/2019 CLINICAL DATA:  68 year old female with abdominal pain. History of prior perforated duodenal ulcer and exclude Terri laparotomy and pyloroplasty. EXAM: CT ABDOMEN AND PELVIS WITH CONTRAST TECHNIQUE: Multidetector CT imaging of the abdomen and pelvis was performed using the standard protocol following bolus administration of intravenous contrast. CONTRAST:  100mL OMNIPAQUE IOHEXOL 300 MG/ML  SOLN COMPARISON:  CT of the abdomen pelvis dated 08/05/2011. FINDINGS:  Evaluation of this exam is limited due to respiratory motion artifact. Lower chest: The visualized lung bases are clear. Minimal right middle lobe atelectasis/scarring. There is mild cardiomegaly. Coronary vascular calcifications noted. No definite intra-abdominal free air or free fluid. Hepatobiliary: A 1 cm hypodense lesion in the dome of the liver is not well characterized but appears similar to prior CT, likely a cyst. The liver is otherwise unremarkable. There is mild intrahepatic biliary ductal dilatation. No calcified gallstone or pericholecystic fluid. Pancreas: Unremarkable. No pancreatic ductal dilatation or surrounding inflammatory changes. Spleen: Normal in size without focal abnormality. Adrenals/Urinary Tract: The adrenal glands are unremarkable. There is no hydronephrosis on either side. There is symmetric enhancement and excretion of contrast by both kidneys. Subcentimeter left renal hypodense focus is too small to characterize but likely represents a cyst. The urinary bladder is unremarkable. Stomach/Bowel: Extensive sigmoid diverticulosis with active inflammatory changes. There is a 9.7 x 3.7 x 3.8 cm diverticular abscess along the sigmoid colon. Postsurgical changes of gastrojejunostomy. No evidence of bowel obstruction. A fluid containing structure with pockets of air in the upper abdomen anteriorly (series 3, image 27) appears similar to the CT of 2013 and may represent the duodenal bulb. A 15 mm high attenuating structure within this may represent a chronic stone although an enhancing lesion is not entirely excluded. Vascular/Lymphatic: Moderate aortoiliac atherosclerotic disease. The IVC is unremarkable. No portal venous gas. There is no adenopathy. Reproductive: The uterus is grossly unremarkable. There is loss of fat plane between the superior uterus and diverticular abscess of the sigmoid colon. No adnexal masses. Other: None Musculoskeletal: Osteopenia. No acute osseous pathology.  IMPRESSION: 1. Sigmoid diverticulitis with a large diverticular abscess. There is loss of fat plane between the superior uterus and diverticular abscess. 2. A 15 mm high attenuating structure in the anterior upper abdomen appears similar to prior CT and may represent the duodenal bulb. Aortic Atherosclerosis (ICD10-I70.0). Electronically Signed   By: Elgie CollardArash  Radparvar M.D.   On: 04/05/2019 14:14   Ct Image Guided Drainage By Percutaneous Catheter  Result Date: 04/06/2019  INDICATION: 68 year old female with sigmoid colonic diverticulitis and large intramural abscess. She presents for CT-guided drain placement. EXAM: CT-guided drain placement MEDICATIONS: The patient is currently admitted to the hospital and receiving intravenous antibiotics. The antibiotics were administered within an appropriate time frame prior to the initiation of the procedure. ANESTHESIA/SEDATION: Fentanyl 100 mcg IV; Versed 2 mg IV Moderate Sedation Time:  17 minutes The patient was continuously monitored during the procedure by the interventional radiology nurse under my direct supervision. COMPLICATIONS: None immediate. PROCEDURE: Informed written consent was obtained from the patient after a thorough discussion of the procedural risks, benefits and alternatives. All questions were addressed. Maximal Sterile Barrier Technique was utilized including caps, mask, sterile gowns, sterile gloves, sterile drape, hand hygiene and skin antiseptic. A timeout was performed prior to the initiation of the procedure. A planning axial CT scan was performed. The fluid and gas collection in the left lower quadrant of the anatomic pelvis position between the sigmoid colon, uterus and bladder was successfully identified. A suitable skin entry site was selected and marked. Care was taken to ensure that the entry site was below the redundant portion of the sigmoid colon. After sterile prep and drape in the standard fashion with chlorhexidine, local anesthesia  was attained by infiltration with 1% lidocaine. A small dermatotomy was made. Under intermittent CT guidance, an 18 gauge trocar needle was advanced into the fluid and gas collection. A 0.035 wire was then advanced into the fluid and gas collection. The needle was exchanged for a fascial dilator and the soft tissue tract was dilated to 12 [place]. A Cook 12 [place] all-purpose drainage catheter modified with additional sideholes was then advanced over the wire and formed. Aspiration yields approximately 80 cc of thick purulent material. A sample was sent for Gram stain and culture. The abscess cavity was lavaged with sterile saline and the drain connected to JP bulb suction. The drain was then secured to the skin with 0 Prolene suture and an adhesive fixation device. The patient tolerated the procedure well. IMPRESSION: Successful placement of a 12 French drainage catheter modified with additional sideholes. Aspiration yields 80 mL of frankly purulent and foul-smelling fluid. Samples were sent for Gram stain and culture. PLAN: Maintain drain to JP bulb suction. Flush drainage catheter at least once per shift. When drain output has decreased significantly, recommend follow-up with both contrast-enhanced CT scan of the pelvis to assess for resolution of the abscess as well as drain injection to assess for fistula with the sigmoid colon given the intramural location of the abscess. Signed, Sterling Big, MD, RPVI [facility] Electronically Signed   By: Malachy Moan M.D.   On: 04/06/2019 12:42    Time Spent in minutes  30   Susa Raring M.D on 04/07/2019 at 11:24 AM  To page go to [email or web address] - password [email or web address]

## 2019-04-07 NOTE — Progress Notes (Signed)
Referring Physician(s): Newman,D  Supervising Physician: Arne Cleveland  Patient Status:  [facility] - In-pt  Chief Complaint:  Abdominal pain/abscess  Subjective: Pt alert to name/DOB/year and location; denies worsening abd pain; ambulating   Allergies: Sulfa antibiotics, Sulfonamide derivatives, Penicillins, and Hydromorphone hcl  Medications: Prior to Admission medications   Medication Sig Start Date End Date Taking? Authorizing Provider  acetaminophen (TYLENOL) 500 MG tablet Take 500 mg by mouth every 6 (six) hours as needed for mild pain.    Yes [provider]     Vital Signs: BP (!) 172/84 (BP Location: Left Arm)    Pulse 61    Temp 97.6 F (36.4 C) (Oral)    Resp 18    Ht 5\' 3"  (1.6 m)    Wt 94 lb 2.2 oz (42.7 kg)    SpO2 100%    BMI 16.68 kg/m   Physical Exam LLQ drain intact, dressing dry, mildly tender, output 35 cc serosang fluid; drain flushed without difficulty  Imaging: Ct Head Wo Contrast  Result Date: 04/06/2019 CLINICAL DATA:  Altered level of consciousness today. EXAM: CT HEAD WITHOUT CONTRAST TECHNIQUE: Contiguous axial images were obtained from the base of the skull through the vertex without intravenous contrast. COMPARISON:  Head CT scan 06/04/2014. FINDINGS: Brain: No evidence of acute infarction, hemorrhage, hydrocephalus, extra-axial collection or mass lesion/mass effect. Chronic microvascular ischemic change has markedly worsened since the prior head CT. Left frontal infarct is remote but new since the prior CT. Vascular: No hyperdense vessel or unexpected calcification. Skull: Intact.  No focal lesion. Sinuses/Orbits: Negative. Other: None. IMPRESSION: No acute abnormality. Extensive chronic microvascular ischemic change. Remote left frontal infarct noted. Electronically Signed   By: Inge Rise M.D.   On: 04/06/2019 13:34   Ct Abdomen Pelvis W Contrast  Result Date: 04/05/2019 CLINICAL DATA:  68 year old female with abdominal pain.  History of prior perforated duodenal ulcer and exclude Terri laparotomy and pyloroplasty. EXAM: CT ABDOMEN AND PELVIS WITH CONTRAST TECHNIQUE: Multidetector CT imaging of the abdomen and pelvis was performed using the standard protocol following bolus administration of intravenous contrast. CONTRAST:  113mL OMNIPAQUE IOHEXOL 300 MG/ML  SOLN COMPARISON:  CT of the abdomen pelvis dated 08/05/2011. FINDINGS: Evaluation of this exam is limited due to respiratory motion artifact. Lower chest: The visualized lung bases are clear. Minimal right middle lobe atelectasis/scarring. There is mild cardiomegaly. Coronary vascular calcifications noted. No definite intra-abdominal free air or free fluid. Hepatobiliary: A 1 cm hypodense lesion in the dome of the liver is not well characterized but appears similar to prior CT, likely a cyst. The liver is otherwise unremarkable. There is mild intrahepatic biliary ductal dilatation. No calcified gallstone or pericholecystic fluid. Pancreas: Unremarkable. No pancreatic ductal dilatation or surrounding inflammatory changes. Spleen: Normal in size without focal abnormality. Adrenals/Urinary Tract: The adrenal glands are unremarkable. There is no hydronephrosis on either side. There is symmetric enhancement and excretion of contrast by both kidneys. Subcentimeter left renal hypodense focus is too small to characterize but likely represents a cyst. The urinary bladder is unremarkable. Stomach/Bowel: Extensive sigmoid diverticulosis with active inflammatory changes. There is a 9.7 x 3.7 x 3.8 cm diverticular abscess along the sigmoid colon. Postsurgical changes of gastrojejunostomy. No evidence of bowel obstruction. A fluid containing structure with pockets of air in the upper abdomen anteriorly (series 3, image 27) appears similar to the CT of 2013 and may represent the duodenal bulb. A 15 mm high attenuating structure within this may represent a chronic  stone although an enhancing lesion  is not entirely excluded. Vascular/Lymphatic: Moderate aortoiliac atherosclerotic disease. The IVC is unremarkable. No portal venous gas. There is no adenopathy. Reproductive: The uterus is grossly unremarkable. There is loss of fat plane between the superior uterus and diverticular abscess of the sigmoid colon. No adnexal masses. Other: None Musculoskeletal: Osteopenia. No acute osseous pathology. IMPRESSION: 1. Sigmoid diverticulitis with a large diverticular abscess. There is loss of fat plane between the superior uterus and diverticular abscess. 2. A 15 mm high attenuating structure in the anterior upper abdomen appears similar to prior CT and may represent the duodenal bulb. Aortic Atherosclerosis (ICD10-I70.0). Electronically Signed   By: Elgie CollardArash  Radparvar M.D.   On: 04/05/2019 14:14   Ct Image Guided Drainage By Percutaneous Catheter  Result Date: 04/06/2019 INDICATION: 68 year old female with sigmoid colonic diverticulitis and large intramural abscess. She presents for CT-guided drain placement. EXAM: CT-guided drain placement MEDICATIONS: The patient is currently admitted to the hospital and receiving intravenous antibiotics. The antibiotics were administered within an appropriate time frame prior to the initiation of the procedure. ANESTHESIA/SEDATION: Fentanyl 100 mcg IV; Versed 2 mg IV Moderate Sedation Time:  17 minutes The patient was continuously monitored during the procedure by the interventional radiology nurse under my direct supervision. COMPLICATIONS: None immediate. PROCEDURE: Informed written consent was obtained from the patient after a thorough discussion of the procedural risks, benefits and alternatives. All questions were addressed. Maximal Sterile Barrier Technique was utilized including caps, mask, sterile gowns, sterile gloves, sterile drape, hand hygiene and skin antiseptic. A timeout was performed prior to the initiation of the procedure. A planning axial CT scan was performed.  The fluid and gas collection in the left lower quadrant of the anatomic pelvis position between the sigmoid colon, uterus and bladder was successfully identified. A suitable skin entry site was selected and marked. Care was taken to ensure that the entry site was below the redundant portion of the sigmoid colon. After sterile prep and drape in the standard fashion with chlorhexidine, local anesthesia was attained by infiltration with 1% lidocaine. A small dermatotomy was made. Under intermittent CT guidance, an 18 gauge trocar needle was advanced into the fluid and gas collection. A 0.035 wire was then advanced into the fluid and gas collection. The needle was exchanged for a fascial dilator and the soft tissue tract was dilated to 12 [place]. A Cook 12 [place] all-purpose drainage catheter modified with additional sideholes was then advanced over the wire and formed. Aspiration yields approximately 80 cc of thick purulent material. A sample was sent for Gram stain and culture. The abscess cavity was lavaged with sterile saline and the drain connected to JP bulb suction. The drain was then secured to the skin with 0 Prolene suture and an adhesive fixation device. The patient tolerated the procedure well. IMPRESSION: Successful placement of a 12 French drainage catheter modified with additional sideholes. Aspiration yields 80 mL of frankly purulent and foul-smelling fluid. Samples were sent for Gram stain and culture. PLAN: Maintain drain to JP bulb suction. Flush drainage catheter at least once per shift. When drain output has decreased significantly, recommend follow-up with both contrast-enhanced CT scan of the pelvis to assess for resolution of the abscess as well as drain injection to assess for fistula with the sigmoid colon given the intramural location of the abscess. Signed, Sterling BigHeath K. McCullough, MD, RPVI [facility] Electronically Signed   By: Malachy MoanHeath   McCullough M.D.   On: 04/06/2019  12:42    Labs:  CBC: Recent Labs    04/04/19 1846 04/06/19 0404 04/07/19 0212  WBC 17.3* 14.5* 9.6  HGB 15.3* 14.0 13.3  HCT 42.1 38.3 37.6  PLT 396 381 406*    COAGS: Recent Labs    04/06/19 0404  INR 1.2  APTT 40*    BMP: Recent Labs    04/04/19 1846 04/06/19 0404 04/07/19 0212  NA 127* 128* 129*  K 3.4* 3.4* 3.5  CL 88* 93* 96*  CO2 26 21* 23  GLUCOSE 118* 81 139*  BUN <5* <5* 5*  CALCIUM 9.1 8.0* 7.8*  CREATININE 0.63 0.50 0.53  GFRNONAA >60 >60 >60  GFRAA >60 >60 >60    LIVER FUNCTION TESTS: Recent Labs    04/04/19 1846 04/06/19 1217 04/07/19 0212  BILITOT 0.5 0.8 0.4  AST 19 19 18   ALT 16 14 13   ALKPHOS 95 86 84  PROT 6.5 6.5 5.8*  ALBUMIN 3.3* 3.0* 2.9*    Assessment and Plan: Pt with hx diverticular abscess, s/p drainage 9/15; afebrile; WBC nl; hgb ok, creat nl; drain fluid cx pend; cont current tx/drain irrigation/output monitoring; once output declines obtain f/u CT ; will also need drain injection before removal   Electronically Signed: D. Jeananne Rama, PA-C 04/07/2019, 12:04 PM   I spent a total of 15 minutes at the the patient's bedside AND on the patient's hospital floor or unit, greater than 50% of which was counseling/coordinating care for left lower abdominal abscess drain    Patient ID: [name], female   DOB: [birth year], 68 y.o.   MRN: [id number]

## 2019-04-07 NOTE — Progress Notes (Signed)
0210 Pt has been very agitated and restless the entire night. She keeps on messing with her IV and her drain and tries to get up all the time. Doesn't listen to this RN and tele monitoring, needs frequent reorientation. Given PRN haldol 2mg .  0512 Changed dressing on L JP drain, clean, dry and intact. Pt still very confuse, agitated and restless. Given ativan 1 mg, will continue to monitor.

## 2019-04-07 NOTE — Care Management Important Message (Signed)
Important Message  Patient Details  Name: [name] MRN: [id number] Date of Birth: September 01, 1950   Medicare Important Message Given:  Yes     Shelda Altes 04/07/2019, 4:02 PM

## 2019-04-07 NOTE — Progress Notes (Signed)
Attempted to visit with the patient but she was sleeping. Spoke with the patient's husband and prayed for the patient by his request. Will continue to provide spiritual care as needed.  Rev. [place].

## 2019-04-07 NOTE — Progress Notes (Addendum)
CC: Abdominal pain/confusion/not eating  Subjective:  Still very confused last evening, and this morning.  Later called her husband back in again.  She is in bed now laying with her back to me.  She does want to turn over.    She does say she is a little tender on the left side on exam.  Drainage is serosanguineous.  If given her some bruits but I cannot tell how much she is actually had to drink.  Objective: Vital signs in last 24 hours: Temp:  [97.6 F (36.4 C)-98.1 F (36.7 C)] 97.6 F (36.4 C) (09/17 0503) Pulse Rate:  [61-79] 61 (09/17 0503) Resp:  [16-18] 18 (09/17 0503) BP: (146-172)/(57-84) 172/84 (09/17 0503) SpO2:  [99 %-100 %] 100 % (09/17 0503) Last BM Date: (PTA) Nothing p.o. recorded 1500 IV recorded 1700 urine 35 cc from the drain No BM She is afebrile, blood pressure sometimes elevated but she is also been fairly agitated. Sodium is 129, potassium 3.5, glucose 139 calcium 7.8. WBC improving down to 9.6 Intake/Output from previous day: 09/16 0701 - 09/17 0700 In: 1506.3 [I.V.:1106.3; IV Piggyback:400] Out: 1735 [Urine:1700; Drains:35] Intake/Output this shift: No intake/output data recorded.  General appearance: alert, cooperative and Still very confused. Resp: clear to auscultation bilaterally GI: Soft, some tenderness on the left side.  Drainage is serosanguineous.  No BM.  Lab Results:  Recent Labs    04/06/19 0404 04/07/19 0212  WBC 14.5* 9.6  HGB 14.0 13.3  HCT 38.3 37.6  PLT 381 406*    BMET Recent Labs    04/06/19 0404 04/07/19 0212  NA 128* 129*  K 3.4* 3.5  CL 93* 96*  CO2 21* 23  GLUCOSE 81 139*  BUN <5* 5*  CREATININE 0.50 0.53  CALCIUM 8.0* 7.8*   PT/INR Recent Labs    04/06/19 0404  LABPROT 15.0  INR 1.2    Recent Labs  Lab 04/04/19 1846 04/06/19 1217 04/07/19 0212  AST 19 19 18   ALT 16 14 13   ALKPHOS 95 86 84  BILITOT 0.5 0.8 0.4  PROT 6.5 6.5 5.8*  ALBUMIN 3.3* 3.0* 2.9*     Lipase     Component  Value Date/Time   LIPASE 17 04/04/2019 1846     Medications: . aspirin  81 mg Oral Daily  . docusate sodium  200 mg Oral BID  . enoxaparin (LOVENOX) injection  40 mg Subcutaneous Q24H  . feeding supplement  1 Container Oral TID BM  . folic acid  1 mg Oral Daily  . LORazepam  0-4 mg Intravenous Q6H   Followed by  . [START ON 04/08/2019] LORazepam  0-4 mg Intravenous Q12H  . multivitamin with minerals  1 tablet Oral Daily  . pantoprazole (PROTONIX) IV  40 mg Intravenous Q12H  . sodium chloride flush  5 mL Intracatheter Q8H  . thiamine  100 mg Oral Daily   Or  . thiamine  100 mg Intravenous Daily    Assessment/Plan Hyponatremia Confusion Hx hypertension Hx malabsorption syndrome Severe malnutrition  - Prealbumin <5  Hx anemia History of alcoholism S/p duodenal ulcer repair Anxiety/insomnia Hx anemia   Severe sigmoid diverticulitis with 9.7 x 3.7 x 3.8 cm diverticular abscess  IR drain placement 9/15 80 ml purulent fluid drained >>35(9/16)  Hx of prior Roux-en-Y-s/p exploratory laparotomy enteral lysis of adhesions, revision of gastrojejunostomy to retrocolic gastrojejunostomy and upper endoscopy 08/14/2011 Dr. Ovidio Kinavid Pranish Akhavan Hx perforated ulcer with exploratory laparotomy, duodenal exclusion, gastrojejunostomy and feeding jejunostomy, Dr.  Wyatt, 08/03/2004.   FEN: N.p.o./IV fluids ice chips and sips from the floor ID: Rocephin/Flagyl-penicillin allergy DVT: Lovenox  Plan: Continue IV antibiotics.  I was going up her to a clear diet but will discuss with Dr. Lucia Gaskins first.        LOS: 2 days    Earnstine Regal 04/07/2019 [phone]  Agree with above.  Her husband is at the bedside.  She had brain MRI today - no acute injury, but chronic changes - encephalomalacia in left frontal lobe and chronic right thalamic lacunar infarct.  She was sedated for the MRI and she still has not woken up.    Abdominal pain minimal - drain 35 cc last 24 hours. Will start  clear liquid diet.  Alphonsa Overall, MD, [facility] Pager: [phone] Office phone:  [phone].

## 2019-04-07 NOTE — Procedures (Signed)
Patient Name: [name]  MRN: [id number]  Epilepsy Attending: Lora Havens  Referring Physician/Provider: Dr Lala Lund Date: 04/07/2019 Duration: 24.52 mins  Patient history: 68yo M with ams. EEG to evaluate for seizure  Level of alertness: awake, asleep  AEDs during EEG study: ativan  Technical aspects: This EEG study was done with scalp electrodes positioned according to the 10-20 International system of electrode placement. Electrical activity was acquired at a sampling rate of 500Hz  and reviewed with a high frequency filter of 70Hz  and a low frequency filter of 1Hz . EEG data were recorded continuously and digitally stored.   DESCRIPTION:  The posterior dominant rhythm consists of 8-9 Hz activity of moderate voltage (25-35 uV) seen predominantly in posterior head regions, symmetric and reactive to eye opening and eye closing.   Sleep was characterized by vertex waves,  Sleep spindles (12-14hz ), maximal frontocentral.  There is an excessive amount of 15 to 18 Hz, 2-3 uV beta activity with irregular morphology distributed symmetrically and diffusely. Hyperventilation and photic stimulation were not performed.  ABNORMALITY: - Excessive fast, generalized  IMPRESSION: This study is within normal limits. No seizures or epileptiform discharges were seen throughout the recording.  The excessive beta activity seen in the background is most likely due to the effect of benzodiazepine and is a benign EEG pattern  Doyne Micke Barbra Sarks

## 2019-04-08 ENCOUNTER — Inpatient Hospital Stay: Payer: Self-pay

## 2019-04-08 LAB — CBC WITH DIFFERENTIAL/PLATELET
Abs Immature Granulocytes: 0.09 10*3/uL — ABNORMAL HIGH (ref 0.00–0.07)
Basophils Absolute: 0.1 10*3/uL (ref 0.0–0.1)
Basophils Relative: 1 %
Eosinophils Absolute: 0 10*3/uL (ref 0.0–0.5)
Eosinophils Relative: 0 %
HCT: 37.8 % (ref 36.0–46.0)
Hemoglobin: 14 g/dL (ref 12.0–15.0)
Immature Granulocytes: 1 %
Lymphocytes Relative: 19 %
Lymphs Abs: 1.5 10*3/uL (ref 0.7–4.0)
MCH: 34.1 pg — ABNORMAL HIGH (ref 26.0–34.0)
MCHC: 37 g/dL — ABNORMAL HIGH (ref 30.0–36.0)
MCV: 92.2 fL (ref 80.0–100.0)
Monocytes Absolute: 0.9 10*3/uL (ref 0.1–1.0)
Monocytes Relative: 11 %
Neutro Abs: 5.5 10*3/uL (ref 1.7–7.7)
Neutrophils Relative %: 68 %
Platelets: 379 10*3/uL (ref 150–400)
RBC: 4.1 MIL/uL (ref 3.87–5.11)
RDW: 12.3 % (ref 11.5–15.5)
WBC: 8.1 10*3/uL (ref 4.0–10.5)
nRBC: 0 % (ref 0.0–0.2)

## 2019-04-08 LAB — COMPREHENSIVE METABOLIC PANEL
ALT: 14 U/L (ref 0–44)
AST: 16 U/L (ref 15–41)
Albumin: 2.9 g/dL — ABNORMAL LOW (ref 3.5–5.0)
Alkaline Phosphatase: 75 U/L (ref 38–126)
Anion gap: 12 (ref 5–15)
BUN: <5 mg/dL — ABNORMAL LOW (ref 8–23)
CO2: 23 mmol/L (ref 22–32)
Calcium: 8.1 mg/dL — ABNORMAL LOW (ref 8.9–10.3)
Chloride: 92 mmol/L — ABNORMAL LOW (ref 98–111)
Creatinine, Ser: 0.48 mg/dL (ref 0.44–1.00)
GFR calc Af Amer: >60 mL/min (ref >60)
GFR calc non Af Amer: >60 mL/min (ref >60)
Glucose, Bld: 102 mg/dL — ABNORMAL HIGH (ref 70–99)
Potassium: 3.4 mmol/L — ABNORMAL LOW (ref 3.5–5.1)
Sodium: 127 mmol/L — ABNORMAL LOW (ref 135–145)
Total Bilirubin: 0.5 mg/dL (ref 0.3–1.2)
Total Protein: 5.8 g/dL — ABNORMAL LOW (ref 6.5–8.1)

## 2019-04-08 LAB — OSMOLALITY, URINE: Osmolality, Ur: 326 mOsm/kg (ref 300–900)

## 2019-04-08 LAB — SODIUM, URINE, RANDOM: Sodium, Ur: 106 mmol/L

## 2019-04-08 LAB — C-REACTIVE PROTEIN: CRP: 7.8 mg/dL — ABNORMAL HIGH (ref <1.0)

## 2019-04-08 LAB — HEMATOLOGY COMMENTS:

## 2019-04-08 LAB — CREATININE, URINE, RANDOM: Creatinine, Urine: 52.09 mg/dL

## 2019-04-08 LAB — URIC ACID: Uric Acid, Serum: 2 mg/dL — ABNORMAL LOW (ref 2.5–7.1)

## 2019-04-08 LAB — FOLATE RBC
Folate, Hemolysate: 271 ng/mL
Folate, RBC: 708 ng/mL (ref >498)
Hematocrit: 38.3 % (ref 34.0–46.6)

## 2019-04-08 LAB — MAGNESIUM: Magnesium: 1.8 mg/dL (ref 1.7–2.4)

## 2019-04-08 LAB — OSMOLALITY: Osmolality: 255 mOsm/kg — ABNORMAL LOW (ref 275–295)

## 2019-04-08 MED ORDER — CHLORHEXIDINE GLUCONATE CLOTH 2 % EX PADS
6.0000 | MEDICATED_PAD | Freq: Every day | CUTANEOUS | Status: DC
  Administered 2019-04-09 – 2019-04-14 (×5): 6 via TOPICAL

## 2019-04-08 MED ORDER — POTASSIUM CHLORIDE 2 MEQ/ML IV SOLN
INTRAVENOUS | Status: AC
  Administered 2019-04-08: 14:00:00 via INTRAVENOUS
  Filled 2019-04-08 (×2): qty 1000

## 2019-04-08 MED ORDER — SODIUM CHLORIDE 0.9% FLUSH
10.0000 mL | INTRAVENOUS | Status: DC | PRN
  Administered 2019-04-12 – 2019-04-14 (×2): 10 mL
  Filled 2019-04-08 (×2): qty 40

## 2019-04-08 MED ORDER — TRAVASOL 10 % IV SOLN
INTRAVENOUS | Status: AC
  Administered 2019-04-08: 18:00:00 via INTRAVENOUS
  Filled 2019-04-08: qty 480

## 2019-04-08 MED ORDER — KCL-LACTATED RINGERS 20 MEQ/L IV SOLN
INTRAVENOUS | Status: DC
  Filled 2019-04-08: qty 1000

## 2019-04-08 NOTE — Evaluation (Addendum)
Clinical/Bedside Swallow Evaluation Patient Details  Name: [name] MRN: [id number] Date of Birth: [birth year]  Today's Date: 04/08/2019 Time: SLP Start Time (ACUTE ONLY): 1042 SLP Stop Time (ACUTE ONLY): 1100 SLP Time Calculation (min) (ACUTE ONLY): 18 min  Past Medical History:  Past Medical History:  Diagnosis Date  . Alcoholism (HCC) 02/02/2009  . ANXIETY 01/21/2007  . Blood transfusion    hx of 2005   . DIVERTICULOSIS 07/09/2007  . Duodenal ulcer hemorrhage perforated 2006  . Headache(784.0) 05/31/2007   pt denies at 08/12/11 preop visit   . HYPERTENSION 06/23/2007  . HYPONATREMIA 02/02/2009   better off diuretic  . INSOMNIA 05/31/2007  . Iron deficiency anemia    pt denies at 08/12/11 visit   . NSAID-induced duodenal ulcer   . OSTEOPOROSIS 01/21/2007  . Raynaud's syndrome 06/23/2007  . Recurrent upper respiratory infection (URI)    pt reports slight cold using Nyquil prn at bedtime   . VITAMIN B12 DEFICIENCY 10/02/2009  . Zoster 2012   twice - left groin/vagina   Past Surgical History:  Past Surgical History:  Procedure Laterality Date  . COLONOSCOPY  06/18/2007   angulated and stenotic sigmoid colon with severe sigmoid diverticulosis  . EXPLORATORY LAPAROTOMY  08/03/2004   1) duodenal ulcer oversew, pyloroplasty, anterior, posterior and truncal vagotomies  . EXPLORATORY LAPAROTOMY  10/20/2004   1) duodenal ulcer oversew  with exclusion2) side-side gastrojejunostomy 3) feeding jejunostomy  . LAPAROSCOPY  08/14/2011   Procedure: LAPAROSCOPY DIAGNOSTIC;  Surgeon: Kandis Cocking, MD;  Location: [facility];  Service: General;  Laterality: N/A;  . LAPAROTOMY  08/14/2011   Procedure: EXPLORATORY LAPAROTOMY;  Surgeon: Kandis Cocking, MD;  Location: [facility];  Service: General;  Laterality: N/A;  exploratory laparotomy, lysis of adhesions, revision gastrojejunostomy, upper endoscopy  . OPEN REDUCTION INTERNAL FIXATION (ORIF) DISTAL RADIAL FRACTURE Right 05/01/2016   Procedure: OPEN  REDUCTION INTERNAL FIXATION (ORIF) DISTAL RADIAL FRACTURE, right;  Surgeon: Betha Loa, MD;  Location: [facility];  Service: Orthopedics;  Laterality: Right;  . TUBAL LIGATION  1977  . UPPER GASTROINTESTINAL ENDOSCOPY  09/16/2007; 12/21/2008; 03/17/2011   2009: Normal 2010: Normal with enteroscopy2012: Gastrojejunal  anastomotic ulcer, gastric retention   HPI:   diverticulosis, duodenal ulcer performation (s/p repair 2006), Raynaud's, osteoporosis, anxiety, alcoholism.    Assessment / Plan / Recommendation Clinical Impression   Patient was seen for a clinical swallow evaluation. At this time she is cleared for clear liquid diet only. Trials of ice chips and sips of water were observed. Patient had no s/sx of aspiration. Her husband reports she has been tolerating several Boost drinks. Solids were not trialed due to patient's limited diet at this time. No further ST needs. Speech therapy to s/o.     Aspiration Risk  No limitations    Diet Recommendation Thin liquid   Liquid Administration via: Cup;Straw Medication Administration: Whole meds with liquid      Swallow Study   General Date of Onset: 04/04/19 HPI:  diverticulosis, duodenal ulcer performation (s/p repair 2006), Raynaud's, osteoporosis, anxiety, alcoholism.  Type of Study: Bedside Swallow Evaluation Previous Swallow Assessment: none Diet Prior to this Study: Thin liquids Temperature Spikes Noted: No Respiratory Status: Room air History of Recent Intubation: No Behavior/Cognition: Alert;Cooperative;Requires cueing Oral Cavity Assessment: Within Functional Limits Oral Care Completed by SLP: Recent completion by staff Oral Cavity - Dentition: Adequate natural dentition Vision: Functional for self-feeding Self-Feeding Abilities: Needs assist Patient Positioning: Upright in bed Baseline Vocal Quality: Normal Volitional  Cough: Weak Volitional Swallow: Able to elicit    Oral/Motor/Sensory Function Overall Oral  Motor/Sensory Function: Within functional limits   Ice Chips Ice chips: Within functional limits Presentation: Spoon   Thin Liquid Thin Liquid: Within functional limits Presentation: Cup;Straw    Nectar Thick Nectar Thick Liquid: Not tested   Honey Thick Honey Thick Liquid: Not tested   Puree Puree: Not tested   Solid     Solid: Not tested      Lindalou Hose Collier Bohnet, MA, CCC-SLP 04/08/2019 11:04 AM

## 2019-04-08 NOTE — Progress Notes (Signed)
[place] NOTE   Pharmacy Consult for TPN Indication: Diverticulitis with fistula  Patient Measurements: Height: 5\' 3"  (160 cm) Weight: 94 lb 2.2 oz (42.7 kg) IBW/kg (Calculated) : 52.4 TPN AdjBW (KG): 42.7 Body mass index is 16.68 kg/m.   Assessment:  [name] is a 68 y.o. female with medical history significant of hypertension, hypothyroidism, alcoholism, anxiety, and duodenal ulcer perforation s/p surgical repair; presents with complaints of abdominal pain over the last 3-4 days.  History is obtained from the patient's husband over the phone as the patient is confused.  Pain is located in the left lower quadrant of her abdomen.  She had reportedly gotten sick 2 weeks ago and had thrown up once. Since that time he had put her on a soft diet.  She has been doing okay, but over the last last week had stopped eating altogether. Husband denies any knowledge of any blood in stool, fever, or chest pain symptoms. She has not drank alcohol in quite some time.  Pt admitted with sigmoid diverticulitis with large abscess.   9/15- s/p diverticular abscess drain placement (apiration yielded 80 ml frank pus)  9/18 Feculent material coming out of drain  GI: Drain output 20 mls, prealbumin < 5, LBM 9/18, On CLD Endo: CBG 102 Insulin requirements in the past 24 hours: 0 units Lytes: Na 127, K 3.4, Mag 1.8 Renal: Scr 0.48, UOP 1 ml/kg/hr Pulm: RA Cards: no issues Hepatobil: no labs Neuro: confused ID: On ceftriaxone for intraabdominal infection, afebrile, WBC 8.1  TPN Access:PICC to be placed 9/18 TPN start date: 9/18 Nutritional Goals (per RD recommendation on 9/16): Kcal:  1700-1900 Protein:  85-100 grams Fluid:  > 1.7 L  Goal TPN rate is 75 ml/hr (provides 90 g of protein, 247 g of dextrose, and 62 g of lipids which provides 1760 kCals per day)  Current Nutrition:   Plan:  Initiate TPN at 40 mL/hr. This TPN provides 48 g of protein,  131 g of dextrose, and 34 g of lipids which provides 943 kCals per day, meeting 55% of patient needs Electrolytes in TPN: Incr Na, otherwise standard, ME:QAST 1:1 Add MVI, trace elements to TPN Will not add SSI and adjust as needed Decrease IVMF LR with 20 meq kcl/lr to 35 ml/hr at 1800 Monitor TPN labs F/U CBGs, lytes - high risk for refeeding  Alanda Slim, PharmD, [facility] Clinical Pharmacist Please see AMION for all Pharmacists' Contact Phone Numbers 04/08/2019, 11:58 AM

## 2019-04-08 NOTE — Plan of Care (Signed)
  Problem: Health Behavior/Discharge Planning: Goal: Ability to manage health-related needs will improve Outcome: Progressing   

## 2019-04-08 NOTE — Progress Notes (Signed)
Referring Physician(s): Dr. Lucia Gaskins  Supervising Physician: Jacqulynn Cadet  Patient Status:  [facility] - In-pt  Chief Complaint: Diverticular abscess  Subjective: Very sleepy this morning.  Follows commands and assists with exam all with eyes closed.  No complaints.   Husband at bedside with several questions.   Allergies: Sulfa antibiotics, Sulfonamide derivatives, Penicillins, and Hydromorphone hcl  Medications: Prior to Admission medications   Medication Sig Start Date End Date Taking? Authorizing Provider  acetaminophen (TYLENOL) 500 MG tablet Take 500 mg by mouth every 6 (six) hours as needed for mild pain.    Yes [provider]     Vital Signs: BP (!) 163/84 (BP Location: Right Arm)    Pulse 69    Temp 98.7 F (37.1 C) (Oral)    Resp 20    Ht 5\' 3"  (1.6 m)    Wt 94 lb 2.2 oz (42.7 kg)    SpO2 100%    BMI 16.68 kg/m   Physical Exam NAD, alert Abdomen: soft, non-tender, non-distended.  Insertion site intact.  Green, feculent drainage.  Bulb with significant amount of output this AM. 20 mL recorded overnight.   Imaging: Ct Head Wo Contrast  Result Date: 04/06/2019 CLINICAL DATA:  Altered level of consciousness today. EXAM: CT HEAD WITHOUT CONTRAST TECHNIQUE: Contiguous axial images were obtained from the base of the skull through the vertex without intravenous contrast. COMPARISON:  Head CT scan 06/04/2014. FINDINGS: Brain: No evidence of acute infarction, hemorrhage, hydrocephalus, extra-axial collection or mass lesion/mass effect. Chronic microvascular ischemic change has markedly worsened since the prior head CT. Left frontal infarct is remote but new since the prior CT. Vascular: No hyperdense vessel or unexpected calcification. Skull: Intact.  No focal lesion. Sinuses/Orbits: Negative. Other: None. IMPRESSION: No acute abnormality. Extensive chronic microvascular ischemic change. Remote left frontal infarct noted. Electronically Signed   By: Inge Rise  M.D.   On: 04/06/2019 13:34   Mr Brain Wo Contrast  Result Date: 04/07/2019 CLINICAL DATA:  Encephalopathy. EXAM: MRI HEAD WITHOUT CONTRAST TECHNIQUE: Multiplanar, multiecho pulse sequences of the brain and surrounding structures were obtained without intravenous contrast. COMPARISON:  Head CT 04/06/2019, brain MRI 02/05/2009 FINDINGS: Brain: Several sequences are motion degraded. There is no evidence of acute infarct. No evidence of intracranial mass. No midline shift or extra-axial fluid collection. Foci of encephalomalacia and chronic hemosiderin deposition within the left frontal lobe at sites of previous hemorrhagic contusions. Advanced scattered T2/FLAIR hyperintensity within the cerebral white matter has progressed since prior MRI 02/05/2009. However, the previously described large right frontal parietal white matter lesion has not significantly changed. A chronic lacunar infarct within the right thalamus is also new from this prior MRI. Mild generalized parenchymal atrophy. Vascular: Flow voids maintained within the proximal large arterial vessels. Skull and upper cervical spine: Normal marrow signal. Incompletely assessed cervical spondylosis with C4-C5 posterior disc osteophyte complex. Sinuses/Orbits: Imaged globes and orbits demonstrate no acute abnormality. Mild ethmoid sinus mucosal thickening. Trace fluid within right mastoid air cells IMPRESSION: Motion degraded examination. No evidence of acute intracranial abnormality, including acute infarct. Foci of encephalomalacia within the left frontal lobe, sequela of remote hemorrhagic contusions. Advanced signal changes within the white matter have significantly progressed since remote MRI 02/05/2009. Findings are nonspecific but may reflect advanced chronic small vessel ischemic disease. Alternative etiologies such as a demyelinating process cannot be excluded. Chronic right thalamic lacunar infarct, new from prior exam. Electronically Signed   By:  Kellie Simmering   On: 04/07/2019 14:30  Ct Abdomen Pelvis W Contrast  Result Date: 04/05/2019 CLINICAL DATA:  68 year old female with abdominal pain. History of prior perforated duodenal ulcer and exclude Terri laparotomy and pyloroplasty. EXAM: CT ABDOMEN AND PELVIS WITH CONTRAST TECHNIQUE: Multidetector CT imaging of the abdomen and pelvis was performed using the standard protocol following bolus administration of intravenous contrast. CONTRAST:  100mL OMNIPAQUE IOHEXOL 300 MG/ML  SOLN COMPARISON:  CT of the abdomen pelvis dated 08/05/2011. FINDINGS: Evaluation of this exam is limited due to respiratory motion artifact. Lower chest: The visualized lung bases are clear. Minimal right middle lobe atelectasis/scarring. There is mild cardiomegaly. Coronary vascular calcifications noted. No definite intra-abdominal free air or free fluid. Hepatobiliary: A 1 cm hypodense lesion in the dome of the liver is not well characterized but appears similar to prior CT, likely a cyst. The liver is otherwise unremarkable. There is mild intrahepatic biliary ductal dilatation. No calcified gallstone or pericholecystic fluid. Pancreas: Unremarkable. No pancreatic ductal dilatation or surrounding inflammatory changes. Spleen: Normal in size without focal abnormality. Adrenals/Urinary Tract: The adrenal glands are unremarkable. There is no hydronephrosis on either side. There is symmetric enhancement and excretion of contrast by both kidneys. Subcentimeter left renal hypodense focus is too small to characterize but likely represents a cyst. The urinary bladder is unremarkable. Stomach/Bowel: Extensive sigmoid diverticulosis with active inflammatory changes. There is a 9.7 x 3.7 x 3.8 cm diverticular abscess along the sigmoid colon. Postsurgical changes of gastrojejunostomy. No evidence of bowel obstruction. A fluid containing structure with pockets of air in the upper abdomen anteriorly (series 3, image 27) appears similar to the CT  of 2013 and may represent the duodenal bulb. A 15 mm high attenuating structure within this may represent a chronic stone although an enhancing lesion is not entirely excluded. Vascular/Lymphatic: Moderate aortoiliac atherosclerotic disease. The IVC is unremarkable. No portal venous gas. There is no adenopathy. Reproductive: The uterus is grossly unremarkable. There is loss of fat plane between the superior uterus and diverticular abscess of the sigmoid colon. No adnexal masses. Other: None Musculoskeletal: Osteopenia. No acute osseous pathology. IMPRESSION: 1. Sigmoid diverticulitis with a large diverticular abscess. There is loss of fat plane between the superior uterus and diverticular abscess. 2. A 15 mm high attenuating structure in the anterior upper abdomen appears similar to prior CT and may represent the duodenal bulb. Aortic Atherosclerosis (ICD10-I70.0). Electronically Signed   By: Elgie CollardArash  Radparvar M.D.   On: 04/05/2019 14:14   Ct Image Guided Drainage By Percutaneous Catheter  Result Date: 04/06/2019 INDICATION: 68 year old female with sigmoid colonic diverticulitis and large intramural abscess. She presents for CT-guided drain placement. EXAM: CT-guided drain placement MEDICATIONS: The patient is currently admitted to the hospital and receiving intravenous antibiotics. The antibiotics were administered within an appropriate time frame prior to the initiation of the procedure. ANESTHESIA/SEDATION: Fentanyl 100 mcg IV; Versed 2 mg IV Moderate Sedation Time:  17 minutes The patient was continuously monitored during the procedure by the interventional radiology nurse under my direct supervision. COMPLICATIONS: None immediate. PROCEDURE: Informed written consent was obtained from the patient after a thorough discussion of the procedural risks, benefits and alternatives. All questions were addressed. Maximal Sterile Barrier Technique was utilized including caps, mask, sterile gowns, sterile gloves,  sterile drape, hand hygiene and skin antiseptic. A timeout was performed prior to the initiation of the procedure. A planning axial CT scan was performed. The fluid and gas collection in the left lower quadrant of the anatomic pelvis position between the sigmoid colon, uterus and  bladder was successfully identified. A suitable skin entry site was selected and marked. Care was taken to ensure that the entry site was below the redundant portion of the sigmoid colon. After sterile prep and drape in the standard fashion with chlorhexidine, local anesthesia was attained by infiltration with 1% lidocaine. A small dermatotomy was made. Under intermittent CT guidance, an 18 gauge trocar needle was advanced into the fluid and gas collection. A 0.035 wire was then advanced into the fluid and gas collection. The needle was exchanged for a fascial dilator and the soft tissue tract was dilated to 12 [place]. A Cook 12 [place] all-purpose drainage catheter modified with additional sideholes was then advanced over the wire and formed. Aspiration yields approximately 80 cc of thick purulent material. A sample was sent for Gram stain and culture. The abscess cavity was lavaged with sterile saline and the drain connected to JP bulb suction. The drain was then secured to the skin with 0 Prolene suture and an adhesive fixation device. The patient tolerated the procedure well. IMPRESSION: Successful placement of a 12 French drainage catheter modified with additional sideholes. Aspiration yields 80 mL of frankly purulent and foul-smelling fluid. Samples were sent for Gram stain and culture. PLAN: Maintain drain to JP bulb suction. Flush drainage catheter at least once per shift. When drain output has decreased significantly, recommend follow-up with both contrast-enhanced CT scan of the pelvis to assess for resolution of the abscess as well as drain injection to assess for fistula with the sigmoid colon given the intramural location of the  abscess. Signed, Sterling Big, MD, RPVI [facility] Radiology Electronically Signed   By: Malachy Moan M.D.   On: 04/06/2019 12:42    Labs:  CBC: Recent Labs    04/04/19 1846 04/06/19 0404 04/07/19 0212 04/08/19 0219  WBC 17.3* 14.5* 9.6 8.1  HGB 15.3* 14.0 13.3 14.0  HCT 42.1 38.3 37.6 37.8  PLT 396 381 406* 379    COAGS: Recent Labs    04/06/19 0404  INR 1.2  APTT 40*    BMP: Recent Labs    04/04/19 1846 04/06/19 0404 04/07/19 0212 04/08/19 0219  NA 127* 128* 129* 127*  K 3.4* 3.4* 3.5 3.4*  CL 88* 93* 96* 92*  CO2 26 21* 23 23  GLUCOSE 118* 81 139* 102*  BUN <5* <5* 5* <5*  CALCIUM 9.1 8.0* 7.8* 8.1*  CREATININE 0.63 0.50 0.53 0.48  GFRNONAA >60 >60 >60 >60  GFRAA >60 >60 >60 >60    LIVER FUNCTION TESTS: Recent Labs    04/04/19 1846 04/06/19 1217 04/07/19 0212 04/08/19 0219  BILITOT 0.5 0.8 0.4 0.5  AST 19 19 18 16   ALT 16 14 13 14   ALKPHOS 95 86 84 75  PROT 6.5 6.5 5.8* 5.8*  ALBUMIN 3.3* 3.0* 2.9* 2.9*    Assessment and Plan: Diverticular abscess s/p drain placement 9/15 by Dr. Archer Asa Patient now with feculent-appearing output from her drain. Insertion site intact.  Culture growing gram positive cocci. Afebrile.  WBC 8.1.  Husband at bedside with several questions. Plans per [facility].   IR following.  Electronically Signed: Hoyt Koch, PA 04/08/2019, 11:05 AM   I spent a total of 15 Minutes at the the patient's bedside AND on the patient's hospital floor or unit, greater than 50% of which was counseling/coordinating care for intra-abdominal fluid collection.

## 2019-04-08 NOTE — Progress Notes (Addendum)
CC: Abdominal pain/confusion  Subjective:  She was up walking with her husband when I first came on the floor.  I talked to her briefly in the hallway and she seemed a bit less confused.  When I went to the room she was on bedside commode.  When I came back she was in bed but had a loose stool that was green in color all over the bed.    Her IR drain has drainage that is also green in color.  Her abdomen is not distended or any more tender than it has been since admission.  Objective: Vital signs in last 24 hours: Temp:  [98.4 F (36.9 C)-98.7 F (37.1 C)] 98.7 F (37.1 C) (09/18 0400) Pulse Rate:  [65-80] 69 (09/18 0400) Resp:  [16-20] 20 (09/18 0400) BP: (163-178)/(83-86) 163/84 (09/18 0400) SpO2:  [86 %-100 %] 100 % (09/18 0400) Last BM Date: 04/06/19 To 40 p.o. recorded 100 IV recorded  1050 urine recorded  20 recorded from the drain  BM x1 Afebrile blood pressure slightly elevated but otherwise normal. No labs this a.m. MRI of brain:No evidence of acute intracranial abnormality, including acute infarct. Foci of encephalomalacia within the left frontal lobe, sequela of remote hemorrhagic contusions. Advanced signal changes within the white matter have significantly progressed since remote MRI 02/05/2009. Findings are nonspecific but may reflect advanced chronic small vessel ischemic disease. Alternative etiologies such as a demyelinating process cannot be excluded. Chronic right thalamic lacunar infarct, new from prior exam. Intake/Output from previous day: 09/17 0701 - 09/18 0700 In: 390 [P.O.:240; I.V.:100; IV Piggyback:50] Out: 1070 [Urine:1050; Drains:20] Intake/Output this shift: No intake/output data recorded.  General appearance: alert, cooperative and Appears a little less confused, but currently anxious and sitting in loose stool. Resp: clear to auscultation bilaterally GI: Her abdomen soft is not tender is not distended.  Drainage from the JP is  green.  She is obviously having multiple stools that are loose.  Lab Results:  Recent Labs    04/07/19 0212 04/08/19 0219  WBC 9.6 8.1  HGB 13.3 14.0  HCT 37.6 37.8  PLT 406* 379    BMET Recent Labs    04/07/19 0212 04/08/19 0219  NA 129* 127*  K 3.5 3.4*  CL 96* 92*  CO2 23 23  GLUCOSE 139* 102*  BUN 5* <5*  CREATININE 0.53 0.48  CALCIUM 7.8* 8.1*   PT/INR Recent Labs    04/06/19 0404  LABPROT 15.0  INR 1.2    Recent Labs  Lab 04/04/19 1846 04/06/19 1217 04/07/19 0212 04/08/19 0219  AST 19 19 18 16   ALT 16 14 13 14   ALKPHOS 95 86 84 75  BILITOT 0.5 0.8 0.4 0.5  PROT 6.5 6.5 5.8* 5.8*  ALBUMIN 3.3* 3.0* 2.9* 2.9*     Lipase     Component Value Date/Time   LIPASE 17 04/04/2019 1846     Medications: . aspirin  81 mg Oral Daily  . docusate sodium  200 mg Oral BID  . enoxaparin (LOVENOX) injection  40 mg Subcutaneous Q24H  . feeding supplement  1 Container Oral TID BM  . folic acid  1 mg Oral Daily  . LORazepam  0-4 mg Intravenous Q6H   Followed by  . LORazepam  0-4 mg Intravenous Q12H  . multivitamin with minerals  1 tablet Oral Daily  . pantoprazole (PROTONIX) IV  40 mg Intravenous Q12H  . sodium chloride flush  5 mL Intracatheter Q8H  . 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 Alphonsa Overall Hx  perforated ulcer with exploratory laparotomy, duodenal exclusion, gastrojejunostomy and feeding jejunostomy, Dr. Hulen Skains, 08/03/2004.   FEN: N.p.o./IV fluids ice chips and sips from the floor ID: Rocephin/Flagyl-penicillin allergy DVT: Lovenox   Plan: Ongoing to keep her on clear liquids and antibiotics for now.   We discussed TPN.  We were hoping to advance her diet, but I will wait until I discuss it with Dr. Lucia Gaskins.   LOS: 3 days   JENNINGS,WILLARD 04/08/2019 [phone]  Agree with above.  The drainage color is now greenish, which probably represents a fistula - which is not surprising noting the size of the original abscess. Husband very anxious. I drew a diagram of the findings for him. Her pre albumin is <5.  Plan PICC/TPN.  Alphonsa Overall, MD, [facility] Surgery Pager: [phone] Office phone:  [phone]

## 2019-04-08 NOTE — Progress Notes (Signed)
PROGRESS NOTE                                                                                                                                                                                                             Patient Demographics:    [name], is a 68 y.o. female, DOB - 01/06/1951, ZOX:[id number]  Admit date - 04/04/2019   Admitting Physician Clydie Braun, MD  Outpatient Primary MD for the patient is Gordy Savers, MD  LOS - 3  Chief Complaint  Patient presents with   Abdominal Pain       Brief Narrative  [name] is a 68 y.o. female with medical history significant of hypertension, hypothyroidism, alcoholism, anxiety, and duodenal ulcer perforation s/p surgical repair; presents with complaints of abdominal pain over the last 3-4 days.  Apparently patient has been confused for several months which had gotten worse in the last 3 to 4 weeks per husband, he did not seek medical help and could not give me a good reason for why he did not seek help.  I discussed with patient's husband her medical situation on 04/06/2019 according to the husband patient has been complaining of abdominal pain for the last few days prior to hospital admission, she was heavily drinking up till about 1 month ago and still drinking a few drinks till 1 week ago.  In the ER she was diagnosed with diverticular abscess, IR and general surgery were consulted and she was admitted.   Subjective:   Patient in bed, appears comfortable, denies any headache, no fever, no chest pain or pressure, no shortness of breath , no abdominal pain. No focal weakness.   Assessment  & Plan :     1.  Diverticular abscess.  IR consulted along with general surgery, does have a left lower quadrant JP drain which was placed on 04/05/2019.  Empiric IV antibiotics.  Follow cultures which are so far growing gram-positive cocci.  Clear liquid diet with gentle hydration and monitor  2.  Alcohol abuse.  Ongoing.   No signs of DTs, CIWA protocol.  Counseled to quit.  3.  Acute metabolic encephalopathy on top of chronic encephalopathy with previous history of CVA.  Seems to have this problem for several months, no help was sought by husband.  Stable B12, TSH, RPR and folic acid levels, nonacute CT head and MRI brain showing old left frontal CVA.  EEG unremarkable.  For now IV thiamine as this could be Wernicke's  encephalopathy with her longstanding history of alcohol abuse.  4.  Hyponatremia and hypokalemia.  Was initially hydrated, again hyponatremic, will check urine and serum electrolytes.  Could be SIADH.  For now gentle IV fluids and continue to monitor closely.  5.  History of malabsorption. Stable anemia panel along with B12, folic acid levels pending.    Family Communication  :  Husband 9/16, again bedside on 9/18  Code Status :  Full  Disposition Plan  :  TBD  Consults  :  CCS, IR  Procedures  :    MRI  EEG  CT -  1. Sigmoid diverticulitis with a large diverticular abscess. There is loss of fat plane between the superior uterus and diverticular abscess. 2. A 15 mm high attenuating structure in the anterior upper abdomen appears similar to prior CT and may represent the duodenal bulb. Aortic Atherosclerosis.   DVT Prophylaxis  :  Lovenox   Lab Results  Component Value Date   PLT 379 04/08/2019    Diet :  Diet Order            Diet clear liquid Room service appropriate? Yes; Fluid consistency: Thin  Diet effective now               Inpatient Medications Scheduled Meds:  aspirin  81 mg Oral Daily   docusate sodium  200 mg Oral BID   enoxaparin (LOVENOX) injection  40 mg Subcutaneous Q24H   feeding supplement  1 Container Oral TID BM   folic acid  1 mg Oral Daily   LORazepam  0-4 mg Intravenous Q12H   multivitamin with minerals  1 tablet Oral Daily   pantoprazole (PROTONIX) IV  40 mg Intravenous Q12H   sodium chloride flush  5 mL Intracatheter Q8H   [START  ON 04/13/2019] thiamine  100 mg Oral Daily   Continuous Infusions:  cefTRIAXone (ROCEPHIN)  IV 2 g (04/07/19 1529)   metronidazole 500 mg (04/08/19 0757)   thiamine injection 500 mg (04/08/19 0941)   PRN Meds:.acetaminophen **OR** acetaminophen, haloperidol lactate, hydrALAZINE, LORazepam **OR** LORazepam, ondansetron **OR** ondansetron (ZOFRAN) IV  Antibiotics  :   Anti-infectives (From admission, onward)   Start     Dose/Rate Route Frequency Ordered Stop   04/06/19 1400  cefTRIAXone (ROCEPHIN) 2 g in sodium chloride 0.9 % 100 mL IVPB     2 g 200 mL/hr over 30 Minutes Intravenous Every 24 hours 04/05/19 1729     04/06/19 0000  metroNIDAZOLE (FLAGYL) IVPB 500 mg     500 mg 100 mL/hr over 60 Minutes Intravenous Every 8 hours 04/05/19 1729     04/05/19 1500  cefTRIAXone (ROCEPHIN) 2 g in sodium chloride 0.9 % 100 mL IVPB     2 g 200 mL/hr over 30 Minutes Intravenous  Once 04/05/19 1450 04/05/19 1547   04/05/19 1445  ceFEPIme (MAXIPIME) 2 g in sodium chloride 0.9 % 100 mL IVPB  Status:  Discontinued     2 g 200 mL/hr over 30 Minutes Intravenous Every 12 hours 04/05/19 1444 04/05/19 1450   04/05/19 1445  metroNIDAZOLE (FLAGYL) IVPB 500 mg     500 mg 100 mL/hr over 60 Minutes Intravenous  Once 04/05/19 1444 04/05/19 1659   04/05/19 0945  cephALEXin (KEFLEX) capsule 500 mg     500 mg Oral  Once 04/05/19 0937 04/05/19 0950          Objective:   Vitals:   04/07/19 0503 04/07/19 1300 04/07/19 2023 04/08/19 0400  BP: (!) 172/84 (!) 178/86 (!) 164/83 (!) 163/84  Pulse: 61 65 80 69  Resp: 18 16 18 20   Temp: 97.6 F (36.4 C)  98.4 F (36.9 C) 98.7 F (37.1 C)  TempSrc: Oral  Oral Oral  SpO2: 100% (!) 86% 100% 100%  Weight:      Height:        Wt Readings from Last 3 Encounters:  04/06/19 42.7 kg  01/27/18 42.7 kg  10/20/17 41.3 kg     Intake/Output Summary (Last 24 hours) at 04/08/2019 1145 Last data filed at 04/08/2019 0902 Gross per 24 hour  Intake 510 ml  Output  1070 ml  Net -560 ml     Physical Exam  Awake but confused, No new F.N deficits, Normal affect [place].AT,PERRAL Supple Neck,No JVD, No cervical lymphadenopathy appriciated.  Symmetrical Chest wall movement, Good air movement bilaterally, CTAB RRR,No Gallops, Rubs or new Murmurs, No Parasternal Heave +ve B.Sounds, Abd Soft, No tenderness, No organomegaly appriciated, No rebound - guarding or rigidity.  Left lower quadrant JP drain in place with some feculent discharge. No Cyanosis, Clubbing or edema, No new Rash or bruise     Data Review:    CBC Recent Labs  Lab 04/04/19 1846 04/06/19 0404 04/07/19 0212 04/08/19 0219  WBC 17.3* 14.5* 9.6 8.1  HGB 15.3* 14.0 13.3 14.0  HCT 42.1 38.3 37.6 37.8  PLT 396 381 406* 379  MCV 93.3 92.7 93.3 92.2  MCH 33.9 33.9 33.0 34.1*  MCHC 36.3* 36.6* 35.4 37.0*  RDW 12.2 12.3 12.1 12.3  LYMPHSABS  --   --  1.2 1.5  MONOABS  --   --  1.1* 0.9  EOSABS  --   --  0.0 0.0  BASOSABS  --   --  0.1 0.1    Chemistries  Recent Labs  Lab 04/04/19 1846 04/05/19 2000 04/06/19 0404 04/06/19 1217 04/07/19 0212 04/08/19 0219  NA 127*  --  128*  --  129* 127*  K 3.4*  --  3.4*  --  3.5 3.4*  CL 88*  --  93*  --  96* 92*  CO2 26  --  21*  --  23 23  GLUCOSE 118*  --  81  --  139* 102*  BUN <5*  --  <5*  --  5* <5*  CREATININE 0.63  --  0.50  --  0.53 0.48  CALCIUM 9.1  --  8.0*  --  7.8* 8.1*  MG  --  1.8  --  1.8 1.8 1.8  AST 19  --   --  19 18 16   ALT 16  --   --  14 13 14   ALKPHOS 95  --   --  86 84 75  BILITOT 0.5  --   --  0.8 0.4 0.5   ------------------------------------------------------------------------------------------------------------------ Recent Labs    04/06/19 1230  CHOL 163  HDL 41  LDLCALC 108*  TRIG 69  CHOLHDL 4.0    Lab Results  Component Value Date   HGBA1C 5.8 (H) 04/06/2019   ------------------------------------------------------------------------------------------------------------------ Recent Labs     04/06/19 1230  TSH 2.446   ------------------------------------------------------------------------------------------------------------------ Recent Labs    04/06/19 1217 04/07/19 1139  VITAMINB12 1,840*  --   FOLATE  --  11.5  FERRITIN  --  139  TIBC 217*  --   IRON 49  --   RETICCTPCT 1.0  --     Coagulation profile Recent Labs  Lab 04/06/19 0404  INR 1.2  No results for input(s): DDIMER in the last 72 hours.  Cardiac Enzymes No results for input(s): CKMB, TROPONINI, MYOGLOBIN in the last 168 hours.  Invalid input(s): CK ------------------------------------------------------------------------------------------------------------------ No results found for: BNP  Micro Results Recent Results (from the past 240 hour(s))  Urine culture     Status: Abnormal   Collection Time: 04/05/19  8:45 AM   Specimen: Urine, Random  Result Value Ref Range Status   Specimen Description URINE, RANDOM  Final   Special Requests NONE  Final   Culture (A)  Final    <10,000 COLONIES/mL INSIGNIFICANT GROWTH Performed at [facility], [address], [place], [place] [address]    Report Status 04/06/2019 FINAL  Final  SARS Coronavirus 2 [facility] order, Performed in [facility] hospital lab) Nasopharyngeal Nasopharyngeal Swab     Status: None   Collection Time: 04/05/19  2:45 PM   Specimen: Nasopharyngeal Swab  Result Value Ref Range Status   SARS Coronavirus 2 NEGATIVE NEGATIVE Final    Comment: (NOTE) If result is NEGATIVE SARS-CoV-2 target nucleic acids are NOT DETECTED. The SARS-CoV-2 RNA is generally detectable in upper and lower  respiratory specimens during the acute phase of infection. The lowest  concentration of SARS-CoV-2 viral copies this assay can detect is 250  copies / mL. A negative result does not preclude SARS-CoV-2 infection  and should not be used as the sole basis for treatment or other  patient management decisions.  A negative result may occur with    improper specimen collection / handling, submission of specimen other  than nasopharyngeal swab, presence of viral mutation(s) within the  areas targeted by this assay, and inadequate number of viral copies  (<250 copies / mL). A negative result must be combined with clinical  observations, patient history, and epidemiological information. If result is POSITIVE SARS-CoV-2 target nucleic acids are DETECTED. The SARS-CoV-2 RNA is generally detectable in upper and lower  respiratory specimens dur ing the acute phase of infection.  Positive  results are indicative of active infection with SARS-CoV-2.  Clinical  correlation with patient history and other diagnostic information is  necessary to determine patient infection status.  Positive results do  not rule out bacterial infection or co-infection with other viruses. If result is PRESUMPTIVE POSTIVE SARS-CoV-2 nucleic acids MAY BE PRESENT.   A presumptive positive result was obtained on the submitted specimen  and confirmed on repeat testing.  While 2019 novel coronavirus  (SARS-CoV-2) nucleic acids may be present in the submitted sample  additional confirmatory testing may be necessary for epidemiological  and / or clinical management purposes  to differentiate between  SARS-CoV-2 and other Sarbecovirus currently known to infect humans.  If clinically indicated additional testing with an alternate test  methodology [phone]) is advised. The SARS-CoV-2 RNA is generally  detectable in upper and lower respiratory sp ecimens during the acute  phase of infection. The expected result is Negative. Fact Sheet for Patients:  BoilerBrush.com.cy Fact Sheet for Healthcare Providers: https://pope.com/ This test is not yet approved or cleared by the [place] FDA and has been authorized for detection and/or diagnosis of SARS-CoV-2 by FDA under an Emergency Use Authorization (EUA).  This EUA will  remain in effect (meaning this test can be used) for the duration of the COVID-19 declaration under Section 564(b)(1) of the Act, 21 U.S.C. section 360bbb-3(b)(1), unless the authorization is terminated or revoked sooner. Performed at [facility], [address], [place], [place] [address]   Aerobic/Anaerobic  Culture (surgical/deep wound)     Status: None (Preliminary result)   Collection Time: 04/05/19  6:49 PM   Specimen: Abscess  Result Value Ref Range Status   Specimen Description ABSCESS  Final   Special Requests Normal  Final   Gram Stain   Final    FEW WBC PRESENT, PREDOMINANTLY PMN MODERATE GRAM POSITIVE COCCI    Culture   Final    CULTURE REINCUBATED FOR BETTER GROWTH Performed at [facility], [address] [place], [place] [id number]    Report Status PENDING  Incomplete    Radiology Reports Ct Head Wo Contrast  Result Date: 04/06/2019 CLINICAL DATA:  Altered level of consciousness today. EXAM: CT HEAD WITHOUT CONTRAST TECHNIQUE: Contiguous axial images were obtained from the base of the skull through the vertex without intravenous contrast. COMPARISON:  Head CT scan 06/04/2014. FINDINGS: Brain: No evidence of acute infarction, hemorrhage, hydrocephalus, extra-axial collection or mass lesion/mass effect. Chronic microvascular ischemic change has markedly worsened since the prior head CT. Left frontal infarct is remote but new since the prior CT. Vascular: No hyperdense vessel or unexpected calcification. Skull: Intact.  No focal lesion. Sinuses/Orbits: Negative. Other: None. IMPRESSION: No acute abnormality. Extensive chronic microvascular ischemic change. Remote left frontal infarct noted. Electronically Signed   By: Drusilla Kannerhomas  Dalessio M.D.   On: 04/06/2019 13:34   Mr Brain Wo Contrast  Result Date: 04/07/2019 CLINICAL DATA:  Encephalopathy. EXAM: MRI HEAD WITHOUT CONTRAST TECHNIQUE: Multiplanar, multiecho pulse sequences of the brain and surrounding structures  were obtained without intravenous contrast. COMPARISON:  Head CT 04/06/2019, brain MRI 02/05/2009 FINDINGS: Brain: Several sequences are motion degraded. There is no evidence of acute infarct. No evidence of intracranial mass. No midline shift or extra-axial fluid collection. Foci of encephalomalacia and chronic hemosiderin deposition within the left frontal lobe at sites of previous hemorrhagic contusions. Advanced scattered T2/FLAIR hyperintensity within the cerebral white matter has progressed since prior MRI 02/05/2009. However, the previously described large right frontal parietal white matter lesion has not significantly changed. A chronic lacunar infarct within the right thalamus is also new from this prior MRI. Mild generalized parenchymal atrophy. Vascular: Flow voids maintained within the proximal large arterial vessels. Skull and upper cervical spine: Normal marrow signal. Incompletely assessed cervical spondylosis with C4-C5 posterior disc osteophyte complex. Sinuses/Orbits: Imaged globes and orbits demonstrate no acute abnormality. Mild ethmoid sinus mucosal thickening. Trace fluid within right mastoid air cells IMPRESSION: Motion degraded examination. No evidence of acute intracranial abnormality, including acute infarct. Foci of encephalomalacia within the left frontal lobe, sequela of remote hemorrhagic contusions. Advanced signal changes within the white matter have significantly progressed since remote MRI 02/05/2009. Findings are nonspecific but may reflect advanced chronic small vessel ischemic disease. Alternative etiologies such as a demyelinating process cannot be excluded. Chronic right thalamic lacunar infarct, new from prior exam. Electronically Signed   By: Jackey LogeKyle  Golden   On: 04/07/2019 14:30   Ct Abdomen Pelvis W Contrast  Result Date: 04/05/2019 CLINICAL DATA:  68 year old female with abdominal pain. History of prior perforated duodenal ulcer and exclude Terri laparotomy and  pyloroplasty. EXAM: CT ABDOMEN AND PELVIS WITH CONTRAST TECHNIQUE: Multidetector CT imaging of the abdomen and pelvis was performed using the standard protocol following bolus administration of intravenous contrast. CONTRAST:  100mL OMNIPAQUE IOHEXOL 300 MG/ML  SOLN COMPARISON:  CT of the abdomen pelvis dated 08/05/2011. FINDINGS: Evaluation of this exam is limited due to respiratory motion artifact. Lower chest: The visualized lung bases are clear. Minimal  right middle lobe atelectasis/scarring. There is mild cardiomegaly. Coronary vascular calcifications noted. No definite intra-abdominal free air or free fluid. Hepatobiliary: A 1 cm hypodense lesion in the dome of the liver is not well characterized but appears similar to prior CT, likely a cyst. The liver is otherwise unremarkable. There is mild intrahepatic biliary ductal dilatation. No calcified gallstone or pericholecystic fluid. Pancreas: Unremarkable. No pancreatic ductal dilatation or surrounding inflammatory changes. Spleen: Normal in size without focal abnormality. Adrenals/Urinary Tract: The adrenal glands are unremarkable. There is no hydronephrosis on either side. There is symmetric enhancement and excretion of contrast by both kidneys. Subcentimeter left renal hypodense focus is too small to characterize but likely represents a cyst. The urinary bladder is unremarkable. Stomach/Bowel: Extensive sigmoid diverticulosis with active inflammatory changes. There is a 9.7 x 3.7 x 3.8 cm diverticular abscess along the sigmoid colon. Postsurgical changes of gastrojejunostomy. No evidence of bowel obstruction. A fluid containing structure with pockets of air in the upper abdomen anteriorly (series 3, image 27) appears similar to the CT of 2013 and may represent the duodenal bulb. A 15 mm high attenuating structure within this may represent a chronic stone although an enhancing lesion is not entirely excluded. Vascular/Lymphatic: Moderate aortoiliac  atherosclerotic disease. The IVC is unremarkable. No portal venous gas. There is no adenopathy. Reproductive: The uterus is grossly unremarkable. There is loss of fat plane between the superior uterus and diverticular abscess of the sigmoid colon. No adnexal masses. Other: None Musculoskeletal: Osteopenia. No acute osseous pathology. IMPRESSION: 1. Sigmoid diverticulitis with a large diverticular abscess. There is loss of fat plane between the superior uterus and diverticular abscess. 2. A 15 mm high attenuating structure in the anterior upper abdomen appears similar to prior CT and may represent the duodenal bulb. Aortic Atherosclerosis (ICD10-I70.0). Electronically Signed   By: Anner Crete M.D.   On: 04/05/2019 14:14   Ct Image Guided Drainage By Percutaneous Catheter  Result Date: 04/06/2019 INDICATION: 68 year old female with sigmoid colonic diverticulitis and large intramural abscess. She presents for CT-guided drain placement. EXAM: CT-guided drain placement MEDICATIONS: The patient is currently admitted to the hospital and receiving intravenous antibiotics. The antibiotics were administered within an appropriate time frame prior to the initiation of the procedure. ANESTHESIA/SEDATION: Fentanyl 100 mcg IV; Versed 2 mg IV Moderate Sedation Time:  17 minutes The patient was continuously monitored during the procedure by the interventional radiology nurse under my direct supervision. COMPLICATIONS: None immediate. PROCEDURE: Informed written consent was obtained from the patient after a thorough discussion of the procedural risks, benefits and alternatives. All questions were addressed. Maximal Sterile Barrier Technique was utilized including caps, mask, sterile gowns, sterile gloves, sterile drape, hand hygiene and skin antiseptic. A timeout was performed prior to the initiation of the procedure. A planning axial CT scan was performed. The fluid and gas collection in the left lower quadrant of the  anatomic pelvis position between the sigmoid colon, uterus and bladder was successfully identified. A suitable skin entry site was selected and marked. Care was taken to ensure that the entry site was below the redundant portion of the sigmoid colon. After sterile prep and drape in the standard fashion with chlorhexidine, local anesthesia was attained by infiltration with 1% lidocaine. A small dermatotomy was made. Under intermittent CT guidance, an 18 gauge trocar needle was advanced into the fluid and gas collection. A 0.035 wire was then advanced into the fluid and gas collection. The needle was exchanged for a fascial dilator and the soft  tissue tract was dilated to 12 [place]. A Cook 12 [place] all-purpose drainage catheter modified with additional sideholes was then advanced over the wire and formed. Aspiration yields approximately 80 cc of thick purulent material. A sample was sent for Gram stain and culture. The abscess cavity was lavaged with sterile saline and the drain connected to JP bulb suction. The drain was then secured to the skin with 0 Prolene suture and an adhesive fixation device. The patient tolerated the procedure well. IMPRESSION: Successful placement of a 12 French drainage catheter modified with additional sideholes. Aspiration yields 80 mL of frankly purulent and foul-smelling fluid. Samples were sent for Gram stain and culture. PLAN: Maintain drain to JP bulb suction. Flush drainage catheter at least once per shift. When drain output has decreased significantly, recommend follow-up with both contrast-enhanced CT scan of the pelvis to assess for resolution of the abscess as well as drain injection to assess for fistula with the sigmoid colon given the intramural location of the abscess. Signed, Sterling Big, MD, RPVI [facility] Radiology Electronically Signed   By: Malachy Moan M.D.   On: 04/06/2019 12:42   [place] Ekg Site  Rite  Result Date: 04/08/2019 If Site Rite image not attached, placement could not be confirmed due to current cardiac rhythm.   Time Spent in minutes  30   Susa Raring M.D on 04/08/2019 at 11:45 AM  To page go to [email or web address] - password [email or web address]

## 2019-04-08 NOTE — Progress Notes (Signed)
Peripherally Inserted Central Catheter/Midline Placement  The IV Nurse has discussed with the patient and/or persons authorized to consent for the patient, the purpose of this procedure and the potential benefits and risks involved with this procedure.  The benefits include less needle sticks, lab draws from the catheter, and the patient may be discharged home with the catheter. Risks include, but not limited to, infection, bleeding, blood clot (thrombus formation), and puncture of an artery; nerve damage and irregular heartbeat and possibility to perform a PICC exchange if needed/ordered by physician.  Alternatives to this procedure were also discussed.  Bard Power PICC patient education guide, fact sheet on infection prevention and patient information card has been provided to patient /or left at bedside.    PICC/Midline Placement Documentation  PICC Double Lumen 48/27/07 PICC Right Basilic 34 cm 0 cm (Active)  Indication for Insertion or Continuance of Line Administration of hyperosmolar/irritating solutions (i.e. TPN, Vancomycin, etc.) 04/08/19 1233  Exposed Catheter (cm) 0 cm 04/08/19 1233  Site Assessment Clean;Dry;Intact 04/08/19 1233  Lumen #1 Status Flushed;Saline locked;Blood return noted 04/08/19 1233  Lumen #2 Status Flushed;Saline locked;Blood return noted 04/08/19 1233  Dressing Type Transparent;Securing device 04/08/19 1233  Dressing Status Clean;Dry;Intact;Antimicrobial disc in place 04/08/19 1233  Dressing Intervention New dressing 04/08/19 1233  Dressing Change Due 04/15/19 04/08/19 1233    Husband @ bedside signed the consent for PICC placement.   Enos Fling 04/08/2019, 12:34 PM

## 2019-04-08 NOTE — Progress Notes (Signed)
Nutrition Follow-up  DOCUMENTATION CODES:   Underweight  INTERVENTION:   -TPN management per pharmacy -Continue Boost Breeze po TID, each supplement provides 250 kcal and 9 grams of protein -RD will follow for diet advancement and adjust supplement regimen as appropriate  NUTRITION DIAGNOSIS:   Inadequate oral intake related to altered GI function as evidenced by meal completion < 25%.  Ongoing  GOAL:   Patient will meet greater than or equal to 90% of their needs  Progressing   MONITOR:   PO intake, Supplement acceptance, Diet advancement, Labs, Weight trends, Skin, I & O's  REASON FOR ASSESSMENT:   Malnutrition Screening Tool, Consult Assessment of nutrition requirement/status  ASSESSMENT:   [name] is a 68 y.o. female with medical history significant of hypertension, hypothyroidism, alcoholism, anxiety, and duodenal ulcer perforation s/p surgical repair; presents with complaints of abdominal pain over the last 3-4 days.  History is obtained from the patient's husband over the phone as the patient is confused.  He reports at baseline she has been confused for quite some time and he needs to have her formally evaluated.  Pain is located in the left lower quadrant of her abdomen.  She had reportedly gotten sick 2 weeks ago and had thrown up once. Since that time he had put her on a soft diet.  She has been doing okay, but over the last last week had stopped eating altogether.  Associated symptoms include complaints of headache.  She denies having any other complaints at this time.  Husband denies any knowledge of any blood in stool, fever, or chest pain symptoms.  He is not on blood thinners.  She has not drank alcohol in quite some time.  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   Pt on clear liquid diet, however, with poor oral intake (meal completion 0-25%). Boost Breeze supplements being given, but unsure if pt is taking.   Per pharmacy note, plan to start TPN @ 40 ml/hr, which provides 943 kcals and 48 grams protein, meeting 55% of estimated kcal needs and 56% of estimated protein needs.   Medications reviewed and include folvite, colace, and thiamine.   Labs reviewed: Na: 127, K: 3.4,   Diet Order:   Diet Order            Diet clear liquid Room service appropriate? Yes; Fluid consistency: Thin  Diet effective now              EDUCATION NEEDS:   No education needs have been identified at this time  Skin:  Skin Assessment: Reviewed RN Assessment  Last BM:  04/07/19  Height:   Ht Readings from Last 1 Encounters:  04/06/19 5\' 3"  (1.6 m)    Weight:   Wt Readings from Last 1 Encounters:  04/06/19 42.7 kg    Ideal Body Weight:  52.3 kg  BMI:  Body mass index is 16.68 kg/m.  Estimated Nutritional Needs:   Kcal:  1700-1900  Protein:  85-100 grams  Fluid:  > 1.7 L    Wendee Hata A. Jimmye Norman, RD, LDN, [place] Registered Dietitian II Certified Diabetes Care and Education Specialist Pager: [phone] After hours Pager: [phone]

## 2019-04-09 LAB — GLUCOSE, CAPILLARY
Glucose-Capillary: 78 mg/dL (ref 70–99)
Glucose-Capillary: 149 mg/dL — ABNORMAL HIGH (ref 70–99)
Glucose-Capillary: 137 mg/dL — ABNORMAL HIGH (ref 70–99)
Glucose-Capillary: 141 mg/dL — ABNORMAL HIGH (ref 70–99)

## 2019-04-09 LAB — COMPREHENSIVE METABOLIC PANEL
ALT: 15 U/L (ref 0–44)
AST: 21 U/L (ref 15–41)
Albumin: 2.6 g/dL — ABNORMAL LOW (ref 3.5–5.0)
Alkaline Phosphatase: 68 U/L (ref 38–126)
Anion gap: 10 (ref 5–15)
BUN: <5 mg/dL — ABNORMAL LOW (ref 8–23)
CO2: 25 mmol/L (ref 22–32)
Calcium: 8.1 mg/dL — ABNORMAL LOW (ref 8.9–10.3)
Chloride: 90 mmol/L — ABNORMAL LOW (ref 98–111)
Creatinine, Ser: 0.41 mg/dL — ABNORMAL LOW (ref 0.44–1.00)
GFR calc Af Amer: >60 mL/min (ref >60)
GFR calc non Af Amer: >60 mL/min (ref >60)
Glucose, Bld: 137 mg/dL — ABNORMAL HIGH (ref 70–99)
Potassium: 3.2 mmol/L — ABNORMAL LOW (ref 3.5–5.1)
Sodium: 125 mmol/L — ABNORMAL LOW (ref 135–145)
Total Bilirubin: 0.3 mg/dL (ref 0.3–1.2)
Total Protein: 5.5 g/dL — ABNORMAL LOW (ref 6.5–8.1)

## 2019-04-09 LAB — CBC WITH DIFFERENTIAL/PLATELET
Abs Immature Granulocytes: 0.07 10*3/uL (ref 0.00–0.07)
Basophils Absolute: 0 10*3/uL (ref 0.0–0.1)
Basophils Relative: 0 %
Eosinophils Absolute: 0 10*3/uL (ref 0.0–0.5)
Eosinophils Relative: 0 %
HCT: 36.1 % (ref 36.0–46.0)
Hemoglobin: 13.1 g/dL (ref 12.0–15.0)
Immature Granulocytes: 1 %
Lymphocytes Relative: 9 %
Lymphs Abs: 1 10*3/uL (ref 0.7–4.0)
MCH: 33.7 pg (ref 26.0–34.0)
MCHC: 36.3 g/dL — ABNORMAL HIGH (ref 30.0–36.0)
MCV: 92.8 fL (ref 80.0–100.0)
Monocytes Absolute: 0.9 10*3/uL (ref 0.1–1.0)
Monocytes Relative: 8 %
Neutro Abs: 9.9 10*3/uL — ABNORMAL HIGH (ref 1.7–7.7)
Neutrophils Relative %: 82 %
Platelets: 375 10*3/uL (ref 150–400)
RBC: 3.89 MIL/uL (ref 3.87–5.11)
RDW: 12.3 % (ref 11.5–15.5)
WBC: 11.9 10*3/uL — ABNORMAL HIGH (ref 4.0–10.5)
nRBC: 0 % (ref 0.0–0.2)

## 2019-04-09 LAB — AEROBIC/ANAEROBIC CULTURE W GRAM STAIN (SURGICAL/DEEP WOUND): Special Requests: NORMAL

## 2019-04-09 LAB — MAGNESIUM: Magnesium: 1.8 mg/dL (ref 1.7–2.4)

## 2019-04-09 LAB — TRIGLYCERIDES: Triglycerides: 45 mg/dL (ref <150)

## 2019-04-09 LAB — PHOSPHORUS: Phosphorus: 2.9 mg/dL (ref 2.5–4.6)

## 2019-04-09 LAB — C-REACTIVE PROTEIN: CRP: 3.6 mg/dL — ABNORMAL HIGH (ref <1.0)

## 2019-04-09 MED ORDER — POTASSIUM PHOSPHATES 15 MMOLE/5ML IV SOLN
10.0000 mmol | Freq: Once | INTRAVENOUS | Status: AC
  Administered 2019-04-09: 10 mmol via INTRAVENOUS
  Filled 2019-04-09: qty 3.33

## 2019-04-09 MED ORDER — INSULIN ASPART 100 UNIT/ML ~~LOC~~ SOLN
0.0000 [IU] | SUBCUTANEOUS | Status: DC
  Administered 2019-04-09 – 2019-04-10 (×5): 1 [IU] via SUBCUTANEOUS

## 2019-04-09 MED ORDER — MAGNESIUM SULFATE 2 GM/50ML IV SOLN
2.0000 g | Freq: Once | INTRAVENOUS | Status: AC
  Administered 2019-04-09: 2 g via INTRAVENOUS
  Filled 2019-04-09: qty 50

## 2019-04-09 MED ORDER — POTASSIUM CHLORIDE 10 MEQ/50ML IV SOLN
10.0000 meq | INTRAVENOUS | Status: AC
  Administered 2019-04-09 (×4): 10 meq via INTRAVENOUS
  Filled 2019-04-09 (×4): qty 50

## 2019-04-09 MED ORDER — FUROSEMIDE 10 MG/ML IJ SOLN
40.0000 mg | Freq: Once | INTRAMUSCULAR | Status: AC
  Administered 2019-04-09: 08:00:00 40 mg via INTRAVENOUS
  Filled 2019-04-09: qty 4

## 2019-04-09 MED ORDER — INFLUENZA VAC A&B SA ADJ QUAD 0.5 ML IM PRSY
0.5000 mL | PREFILLED_SYRINGE | INTRAMUSCULAR | Status: AC
  Administered 2019-04-11: 0.5 mL via INTRAMUSCULAR
  Filled 2019-04-09: qty 0.5

## 2019-04-09 MED ORDER — TRAVASOL 10 % IV SOLN
INTRAVENOUS | Status: AC
  Administered 2019-04-09: 19:00:00 via INTRAVENOUS
  Filled 2019-04-09: qty 720

## 2019-04-09 NOTE — Progress Notes (Signed)
PROGRESS NOTE                                                                                                                                                                                                             Patient Demographics:    [name], is a 68 y.o. female, DOB - 09-08-1950, ZOX:[id number]  Admit date - 04/04/2019   Admitting Physician Clydie Braun, MD  Outpatient Primary MD for the patient is Gordy Savers, MD  LOS - 4  Chief Complaint  Patient presents with   Abdominal Pain       Brief Narrative  [name] is a 68 y.o. female with medical history significant of hypertension, hypothyroidism, alcoholism, anxiety, and duodenal ulcer perforation s/p surgical repair; presents with complaints of abdominal pain over the last 3-4 days.  Apparently patient has been confused for several months which had gotten worse in the last 3 to 4 weeks per husband, he did not seek medical help and could not give me a good reason for why he did not seek help.  I discussed with patient's husband her medical situation on 04/06/2019 according to the husband patient has been complaining of abdominal pain for the last few days prior to hospital admission, she was heavily drinking up till about 1 month ago and still drinking a few drinks till 1 week ago.  In the ER she was diagnosed with diverticular abscess, IR and general surgery were consulted and she was admitted.   Subjective:   Patient in bed, appears comfortable, denies any headache, no fever, no chest pain or pressure, no shortness of breath , no abdominal pain. No focal weakness.   Assessment  & Plan :     1.  Diverticular abscess.  IR consulted along with general surgery, does have a left lower quadrant JP drain which was placed on 04/05/2019.  She is continued on empiric IV Rocephin, general surgery on board, currently on clear liquids along with IV TNA via PICC line.  Prelim abscess cultures growing  gram-positive cocci we will continue to monitor.  2.  Alcohol abuse.  Ongoing.  No signs of DTs, CIWA protocol.  Counseled to quit.  3.  Acute metabolic encephalopathy on top of chronic encephalopathy with previous history of CVA.  Seems to have this problem for several months, no help was sought by husband.  Stable B12, TSH, RPR and folic acid levels, nonacute CT head and MRI brain showing old left frontal CVA.  EEG unremarkable.  For now IV Thiamine as this could be Wernicke's encephalopathy with her longstanding history of alcohol abuse.  4.  Hyponatremia - her urine sodium, electrolytes along with serum osmolality electrolytes and uric acid level suggest SIADH, will try to limit fluid intake, dose of IV Lasix.  Repeat BMP in the morning.  If all fails will use Samsca.  5.  History of malabsorption. Stable anemia panel along with B12, folic acid levels pending.    Family Communication  :  Husband 9/16, again bedside on 9/18, 9/19  Code Status :  Full  Disposition Plan  :  TBD  Consults  :  CCS, IR  Procedures  :    MRI  EEG  CT -  1. Sigmoid diverticulitis with a large diverticular abscess. There is loss of fat plane between the superior uterus and diverticular abscess. 2. A 15 mm high attenuating structure in the anterior upper abdomen appears similar to prior CT and may represent the duodenal bulb. Aortic Atherosclerosis.   DVT Prophylaxis  :  Lovenox   Lab Results  Component Value Date   PLT 375 04/09/2019    Diet :  Diet Order            Diet clear liquid Room service appropriate? Yes; Fluid consistency: Thin  Diet effective now               Inpatient Medications Scheduled Meds:  aspirin  81 mg Oral Daily   Chlorhexidine Gluconate Cloth  6 each Topical Daily   docusate sodium  200 mg Oral BID   enoxaparin (LOVENOX) injection  40 mg Subcutaneous Q24H   feeding supplement  1 Container Oral TID BM   folic acid  1 mg Oral Daily   [START ON 04/10/2019]  influenza vaccine adjuvanted  0.5 mL Intramuscular Tomorrow-1000   insulin aspart  0-9 Units Subcutaneous Q4H   LORazepam  0-4 mg Intravenous Q12H   multivitamin with minerals  1 tablet Oral Daily   pantoprazole (PROTONIX) IV  40 mg Intravenous Q12H   sodium chloride flush  5 mL Intracatheter Q8H   [START ON 04/13/2019] thiamine  100 mg Oral Daily   Continuous Infusions:  cefTRIAXone (ROCEPHIN)  IV 2 g (04/08/19 1408)   metronidazole 500 mg (04/09/19 0733)   potassium chloride 10 mEq (04/09/19 1033)   potassium PHOSPHATE IVPB (in mmol)     thiamine injection 500 mg (04/09/19 1031)   TPN ADULT (ION) 40 mL/hr at 04/09/19 0600   TPN ADULT (ION)     PRN Meds:.acetaminophen **OR** acetaminophen, haloperidol lactate, hydrALAZINE, ondansetron **OR** ondansetron (ZOFRAN) IV, sodium chloride flush  Antibiotics  :   Anti-infectives (From admission, onward)   Start     Dose/Rate Route Frequency Ordered Stop   04/06/19 1400  cefTRIAXone (ROCEPHIN) 2 g in sodium chloride 0.9 % 100 mL IVPB     2 g 200 mL/hr over 30 Minutes Intravenous Every 24 hours 04/05/19 1729     04/06/19 0000  metroNIDAZOLE (FLAGYL) IVPB 500 mg     500 mg 100 mL/hr over 60 Minutes Intravenous Every 8 hours 04/05/19 1729     04/05/19 1500  cefTRIAXone (ROCEPHIN) 2 g in sodium chloride 0.9 % 100 mL IVPB     2 g 200 mL/hr over 30 Minutes Intravenous  Once 04/05/19 1450 04/05/19 1547   04/05/19 1445  ceFEPIme (MAXIPIME) 2 g in sodium chloride 0.9 % 100 mL IVPB  Status:  Discontinued     2  g 200 mL/hr over 30 Minutes Intravenous Every 12 hours 04/05/19 1444 04/05/19 1450   04/05/19 1445  metroNIDAZOLE (FLAGYL) IVPB 500 mg     500 mg 100 mL/hr over 60 Minutes Intravenous  Once 04/05/19 1444 04/05/19 1659   04/05/19 0945  cephALEXin (KEFLEX) capsule 500 mg     500 mg Oral  Once 04/05/19 0937 04/05/19 0950          Objective:   Vitals:   04/08/19 0400 04/08/19 1842 04/08/19 2011 04/09/19 0404  BP: (!)  163/84 (!) 150/79 (!) 144/91 (!) 142/87  Pulse: 69 73 72 78  Resp: 20 15 17 18   Temp: 98.7 F (37.1 C) 97.6 F (36.4 C) 97.8 F (36.6 C) 98 F (36.7 C)  TempSrc: Oral Oral Oral Oral  SpO2: 100% 98% 98% 97%  Weight:      Height:        Wt Readings from Last 3 Encounters:  04/06/19 42.7 kg  01/27/18 42.7 kg  10/20/17 41.3 kg     Intake/Output Summary (Last 24 hours) at 04/09/2019 1102 Last data filed at 04/09/2019 0900 Gross per 24 hour  Intake 1437.06 ml  Output 60 ml  Net 1377.06 ml     Physical Exam  Awake , remains confused,  No new F.N deficits, Normal affect [place].AT,PERRAL Supple Neck,No JVD, No cervical lymphadenopathy appriciated.  Symmetrical Chest wall movement, Good air movement bilaterally, CTAB RRR,No Gallops, Rubs or new Murmurs, No Parasternal Heave +ve B.Sounds, Abd Soft, No tenderness, No organomegaly appriciated, No rebound - guarding or rigidity. Left lower quadrant JP drain in place with some feculent discharge. No Cyanosis, Clubbing or edema, No new Rash or bruise    Data Review:    CBC Recent Labs  Lab 04/04/19 1846 04/06/19 0404 04/07/19 0212 04/07/19 1139 04/08/19 0219 04/09/19 0452  WBC 17.3* 14.5* 9.6  --  8.1 11.9*  HGB 15.3* 14.0 13.3  --  14.0 13.1  HCT 42.1 38.3 37.6 38.3 37.8 36.1  PLT 396 381 406*  --  379 375  MCV 93.3 92.7 93.3  --  92.2 92.8  MCH 33.9 33.9 33.0  --  34.1* 33.7  MCHC 36.3* 36.6* 35.4  --  37.0* 36.3*  RDW 12.2 12.3 12.1  --  12.3 12.3  LYMPHSABS  --   --  1.2  --  1.5 1.0  MONOABS  --   --  1.1*  --  0.9 0.9  EOSABS  --   --  0.0  --  0.0 0.0  BASOSABS  --   --  0.1  --  0.1 0.0    Chemistries  Recent Labs  Lab 04/04/19 1846 04/05/19 2000 04/06/19 0404 04/06/19 1217 04/07/19 0212 04/08/19 0219 04/09/19 0452  NA 127*  --  128*  --  129* 127* 125*  K 3.4*  --  3.4*  --  3.5 3.4* 3.2*  CL 88*  --  93*  --  96* 92* 90*  CO2 26  --  21*  --  23 23 25   GLUCOSE 118*  --  81  --  139* 102* 137*  BUN  <5*  --  <5*  --  5* <5* <5*  CREATININE 0.63  --  0.50  --  0.53 0.48 0.41*  CALCIUM 9.1  --  8.0*  --  7.8* 8.1* 8.1*  MG  --  1.8  --  1.8 1.8 1.8 1.8  AST 19  --   --  19 18  16 21  ALT 16  --   --  14 13 14 15   ALKPHOS 95  --   --  86 84 75 68  BILITOT 0.5  --   --  0.8 0.4 0.5 0.3   ------------------------------------------------------------------------------------------------------------------ Recent Labs    04/06/19 1230 04/09/19 0452  CHOL 163  --   HDL 41  --   LDLCALC 108*  --   TRIG 69 45  CHOLHDL 4.0  --     Lab Results  Component Value Date   HGBA1C 5.8 (H) 04/06/2019   ------------------------------------------------------------------------------------------------------------------ Recent Labs    04/06/19 1230  TSH 2.446   ------------------------------------------------------------------------------------------------------------------ Recent Labs    04/06/19 1217 04/07/19 1139  VITAMINB12 1,840*  --   FOLATE  --  11.5  FERRITIN  --  139  TIBC 217*  --   IRON 49  --   RETICCTPCT 1.0  --     Coagulation profile Recent Labs  Lab 04/06/19 0404  INR 1.2    No results for input(s): DDIMER in the last 72 hours.  Cardiac Enzymes No results for input(s): CKMB, TROPONINI, MYOGLOBIN in the last 168 hours.  Invalid input(s): CK ------------------------------------------------------------------------------------------------------------------ No results found for: BNP  Micro Results Recent Results (from the past 240 hour(s))  Urine culture     Status: Abnormal   Collection Time: 04/05/19  8:45 AM   Specimen: Urine, Random  Result Value Ref Range Status   Specimen Description URINE, RANDOM  Final   Special Requests NONE  Final   Culture (A)  Final    <10,000 COLONIES/mL INSIGNIFICANT GROWTH Performed at [facility], [address], [place], [place] [address]    Report Status 04/06/2019 FINAL  Final  SARS Coronavirus 2 [facility] order,  Performed in [facility] hospital lab) Nasopharyngeal Nasopharyngeal Swab     Status: None   Collection Time: 04/05/19  2:45 PM   Specimen: Nasopharyngeal Swab  Result Value Ref Range Status   SARS Coronavirus 2 NEGATIVE NEGATIVE Final    Comment: (NOTE) If result is NEGATIVE SARS-CoV-2 target nucleic acids are NOT DETECTED. The SARS-CoV-2 RNA is generally detectable in upper and lower  respiratory specimens during the acute phase of infection. The lowest  concentration of SARS-CoV-2 viral copies this assay can detect is 250  copies / mL. A negative result does not preclude SARS-CoV-2 infection  and should not be used as the sole basis for treatment or other  patient management decisions.  A negative result may occur with  improper specimen collection / handling, submission of specimen other  than nasopharyngeal swab, presence of viral mutation(s) within the  areas targeted by this assay, and inadequate number of viral copies  (<250 copies / mL). A negative result must be combined with clinical  observations, patient history, and epidemiological information. If result is POSITIVE SARS-CoV-2 target nucleic acids are DETECTED. The SARS-CoV-2 RNA is generally detectable in upper and lower  respiratory specimens dur ing the acute phase of infection.  Positive  results are indicative of active infection with SARS-CoV-2.  Clinical  correlation with patient history and other diagnostic information is  necessary to determine patient infection status.  Positive results do  not rule out bacterial infection or co-infection with other viruses. If result is PRESUMPTIVE POSTIVE SARS-CoV-2 nucleic acids MAY BE PRESENT.   A presumptive positive result was obtained on the submitted specimen  and confirmed on repeat testing.  While 2019 novel coronavirus  (SARS-CoV-2) nucleic acids may be present  in the submitted sample  additional confirmatory testing may be necessary for epidemiological  and / or  clinical management purposes  to differentiate between  SARS-CoV-2 and other Sarbecovirus currently known to infect humans.  If clinically indicated additional testing with an alternate test  methodology [phone] is advised. The SARS-CoV-2 RNA is generally  detectable in upper and lower respiratory sp ecimens during the acute  phase of infection. The expected result is Negative. Fact Sheet for Patients:  BoilerBrush.com.cyhttps://www.fda.gov/media/136312/download Fact Sheet for Healthcare Providers: https://pope.com/https://www.fda.gov/media/136313/download This test is not yet approved or cleared by the [place] FDA and has been authorized for detection and/or diagnosis of SARS-CoV-2 by FDA under an Emergency Use Authorization (EUA).  This EUA will remain in effect (meaning this test can be used) for the duration of the COVID-19 declaration under Section 564(b)(1) of the Act, 21 U.S.C. section 360bbb-3(b)(1), unless the authorization is terminated or revoked sooner. Performed at [facility], [address] [place], [place] [address]   Aerobic/Anaerobic Culture (surgical/deep wound)     Status: None (Preliminary result)   Collection Time: 04/05/19  6:49 PM   Specimen: Abscess  Result Value Ref Range Status   Specimen Description ABSCESS  Final   Special Requests Normal  Final   Gram Stain   Final    FEW WBC PRESENT, PREDOMINANTLY PMN MODERATE GRAM POSITIVE COCCI    Culture   Final    CULTURE REINCUBATED FOR BETTER GROWTH HOLDING FOR POSSIBLE ANAEROBE Performed at [facility], [address] [place], [place] [address]    Report Status PENDING  Incomplete    Radiology Reports Ct Head Wo Contrast  Result Date: 04/06/2019 CLINICAL DATA:  Altered level of consciousness today. EXAM: CT HEAD WITHOUT CONTRAST TECHNIQUE: Contiguous axial images were obtained from the base of the skull through the vertex without intravenous contrast. COMPARISON:  Head CT scan 06/04/2014. FINDINGS: Brain: No  evidence of acute infarction, hemorrhage, hydrocephalus, extra-axial collection or mass lesion/mass effect. Chronic microvascular ischemic change has markedly worsened since the prior head CT. Left frontal infarct is remote but new since the prior CT. Vascular: No hyperdense vessel or unexpected calcification. Skull: Intact.  No focal lesion. Sinuses/Orbits: Negative. Other: None. IMPRESSION: No acute abnormality. Extensive chronic microvascular ischemic change. Remote left frontal infarct noted. Electronically Signed   By: Drusilla Kannerhomas  Dalessio M.D.   On: 04/06/2019 13:34   Mr Brain Wo Contrast  Result Date: 04/07/2019 CLINICAL DATA:  Encephalopathy. EXAM: MRI HEAD WITHOUT CONTRAST TECHNIQUE: Multiplanar, multiecho pulse sequences of the brain and surrounding structures were obtained without intravenous contrast. COMPARISON:  Head CT 04/06/2019, brain MRI 02/05/2009 FINDINGS: Brain: Several sequences are motion degraded. There is no evidence of acute infarct. No evidence of intracranial mass. No midline shift or extra-axial fluid collection. Foci of encephalomalacia and chronic hemosiderin deposition within the left frontal lobe at sites of previous hemorrhagic contusions. Advanced scattered T2/FLAIR hyperintensity within the cerebral white matter has progressed since prior MRI 02/05/2009. However, the previously described large right frontal parietal white matter lesion has not significantly changed. A chronic lacunar infarct within the right thalamus is also new from this prior MRI. Mild generalized parenchymal atrophy. Vascular: Flow voids maintained within the proximal large arterial vessels. Skull and upper cervical spine: Normal marrow signal. Incompletely assessed cervical spondylosis with C4-C5 posterior disc osteophyte complex. Sinuses/Orbits: Imaged globes and orbits demonstrate no acute abnormality. Mild ethmoid sinus mucosal thickening. Trace fluid within right mastoid air cells IMPRESSION: Motion  degraded examination. No  evidence of acute intracranial abnormality, including acute infarct. Foci of encephalomalacia within the left frontal lobe, sequela of remote hemorrhagic contusions. Advanced signal changes within the white matter have significantly progressed since remote MRI 02/05/2009. Findings are nonspecific but may reflect advanced chronic small vessel ischemic disease. Alternative etiologies such as a demyelinating process cannot be excluded. Chronic right thalamic lacunar infarct, new from prior exam. Electronically Signed   By: Jackey Loge   On: 04/07/2019 14:30   Ct Abdomen Pelvis W Contrast  Result Date: 04/05/2019 CLINICAL DATA:  68 year old female with abdominal pain. History of prior perforated duodenal ulcer and exclude Terri laparotomy and pyloroplasty. EXAM: CT ABDOMEN AND PELVIS WITH CONTRAST TECHNIQUE: Multidetector CT imaging of the abdomen and pelvis was performed using the standard protocol following bolus administration of intravenous contrast. CONTRAST:  OMNIPAQUE IOHEXOL 300 MG/ML  SOLN COMPARISON:  CT of the abdomen pelvis dated 08/05/2011. FINDINGS: Evaluation of this exam is limited due to respiratory motion artifact. Lower chest: The visualized lung bases are clear. Minimal right middle lobe atelectasis/scarring. There is mild cardiomegaly. Coronary vascular calcifications noted. No definite intra-abdominal free air or free fluid. Hepatobiliary: A 1 cm hypodense lesion in the dome of the liver is not well characterized but appears similar to prior CT, likely a cyst. The liver is otherwise unremarkable. There is mild intrahepatic biliary ductal dilatation. No calcified gallstone or pericholecystic fluid. Pancreas: Unremarkable. No pancreatic ductal dilatation or surrounding inflammatory changes. Spleen: Normal in size without focal abnormality. Adrenals/Urinary Tract: The adrenal glands are unremarkable. There is no hydronephrosis on either side. There is symmetric  enhancement and excretion of contrast by both kidneys. Subcentimeter left renal hypodense focus is too small to characterize but likely represents a cyst. The urinary bladder is unremarkable. Stomach/Bowel: Extensive sigmoid diverticulosis with active inflammatory changes. There is a 9.7 x 3.7 x 3.8 cm diverticular abscess along the sigmoid colon. Postsurgical changes of gastrojejunostomy. No evidence of bowel obstruction. A fluid containing structure with pockets of air in the upper abdomen anteriorly (series 3, image 27) appears similar to the CT of 2013 and may represent the duodenal bulb. A 15 mm high attenuating structure within this may represent a chronic stone although an enhancing lesion is not entirely excluded. Vascular/Lymphatic: Moderate aortoiliac atherosclerotic disease. The IVC is unremarkable. No portal venous gas. There is no adenopathy. Reproductive: The uterus is grossly unremarkable. There is loss of fat plane between the superior uterus and diverticular abscess of the sigmoid colon. No adnexal masses. Other: None Musculoskeletal: Osteopenia. No acute osseous pathology. IMPRESSION: 1. Sigmoid diverticulitis with a large diverticular abscess. There is loss of fat plane between the superior uterus and diverticular abscess. 2. A 15 mm high attenuating structure in the anterior upper abdomen appears similar to prior CT and may represent the duodenal bulb. Aortic Atherosclerosis (ICD10-I70.0). Electronically Signed   By: Elgie Collard M.D.   On: 04/05/2019 14:14   Ct Image Guided Drainage By Percutaneous Catheter  Result Date: 04/06/2019 INDICATION: 68 year old female with sigmoid colonic diverticulitis and large intramural abscess. She presents for CT-guided drain placement. EXAM: CT-guided drain placement MEDICATIONS: The patient is currently admitted to the hospital and receiving intravenous antibiotics. The antibiotics were administered within an appropriate time frame prior to the  initiation of the procedure. ANESTHESIA/SEDATION: Fentanyl 100 mcg IV; Versed 2 mg IV Moderate Sedation Time:  17 minutes The patient was continuously monitored during the procedure by the interventional radiology nurse under my direct supervision. COMPLICATIONS: None immediate. PROCEDURE: Informed  written consent was obtained from the patient after a thorough discussion of the procedural risks, benefits and alternatives. All questions were addressed. Maximal Sterile Barrier Technique was utilized including caps, mask, sterile gowns, sterile gloves, sterile drape, hand hygiene and skin antiseptic. A timeout was performed prior to the initiation of the procedure. A planning axial CT scan was performed. The fluid and gas collection in the left lower quadrant of the anatomic pelvis position between the sigmoid colon, uterus and bladder was successfully identified. A suitable skin entry site was selected and marked. Care was taken to ensure that the entry site was below the redundant portion of the sigmoid colon. After sterile prep and drape in the standard fashion with chlorhexidine, local anesthesia was attained by infiltration with 1% lidocaine. A small dermatotomy was made. Under intermittent CT guidance, an 18 gauge trocar needle was advanced into the fluid and gas collection. A 0.035 wire was then advanced into the fluid and gas collection. The needle was exchanged for a fascial dilator and the soft tissue tract was dilated to 12 [place]. A Cook 12 [place] all-purpose drainage catheter modified with additional sideholes was then advanced over the wire and formed. Aspiration yields approximately 80 cc of thick purulent material. A sample was sent for Gram stain and culture. The abscess cavity was lavaged with sterile saline and the drain connected to JP bulb suction. The drain was then secured to the skin with 0 Prolene suture and an adhesive fixation device. The patient tolerated the procedure well. IMPRESSION:  Successful placement of a 12 French drainage catheter modified with additional sideholes. Aspiration yields 80 mL of frankly purulent and foul-smelling fluid. Samples were sent for Gram stain and culture. PLAN: Maintain drain to JP bulb suction. Flush drainage catheter at least once per shift. When drain output has decreased significantly, recommend follow-up with both contrast-enhanced CT scan of the pelvis to assess for resolution of the abscess as well as drain injection to assess for fistula with the sigmoid colon given the intramural location of the abscess. Signed, Criselda Peaches, MD, [facility] Radiology Electronically Signed   By: Jacqulynn Cadet M.D.   On: 04/06/2019 12:42   [place] Ekg Site Rite  Result Date: 04/08/2019 If Site Rite image not attached, placement could not be confirmed due to current cardiac rhythm.   Time Spent in minutes  30   Lala Lund M.D on 04/09/2019 at 11:02 AM  To page go to [email or web address] - password [email or web address]

## 2019-04-09 NOTE — Progress Notes (Signed)
[facility]:  [phone] General Surgery Progress Note   LOS: 4 days  POD -     Chief Complaint: Abdominal pain  Assessment and Plan: 1.  Severe sigmoid diverticulitis with 9.7 x 3.7 x 3.8 cm diverticular abscess IR drain placement 9/15   WBC - 11,900 - 04/09/2019  On clear liquids  2.  Confusion  EtOH related ???  Required Haldol last PM.  She did not go to sleep until 3 AM.  She has her nights and days backwards. 3.  Severemalnutrition-   Prealbumin <5  On TPN - started 9/18 4. Hx hypertension 5.  Hx malabsorption syndrome 6.  History of alcoholism 7.  S/p duodenal ulcer repair  Perforated ulcer - 2006 - [facility], Revision of GJ - 2013 - Meli Faley 8.  Anxiety/insomnia 9.  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  DVT prophylaxis - Lovenox 11.  Hypokalemia  K+ - 3.2 - 04/09/2019  Getting replacement   Principal Problem:   Diverticulitis of large intestine with abscess Active Problems:   Anxiety state   Essential hypertension   Hyponatremia   Dementia (HCC)  Subjective:  Sleepy.  Does not want to wake up.  No specific complaint.  Husband at bedside.  Objective:   Vitals:   04/08/19 2011 04/09/19 0404  BP: (!) 144/91 (!) 142/87  Pulse: 72 78  Resp: 17 18  Temp: 97.8 F (36.6 C) 98 F (36.7 C)  SpO2: 98% 97%     Intake/Output from previous day:  09/18 0701 - 09/19 0700 In: 1257.1 [P.O.:120; I.V.:637.1; IV Piggyback:500] Out: 60 [Drains:60]  Intake/Output this shift:  No intake/output data recorded.   Physical Exam:   General: [name] who is sleepy   HEENT: Normal. Pupils equal. .   Lungs: Clear   Abdomen: Tender lower abdomen.  Drain in LLQ - 60 cc over the last 24 hours   Lab Results:    Recent Labs    04/08/19 0219 04/09/19 0452  WBC 8.1 11.9*  HGB 14.0 13.1  HCT 37.8 36.1  PLT 379 375    BMET   Recent Labs    04/08/19 0219  04/09/19 0452  NA 127* 125*  K 3.4* 3.2*  CL 92* 90*  CO2 23 25  GLUCOSE 102* 137*  BUN <5* <5*  CREATININE 0.48 0.41*  CALCIUM 8.1* 8.1*    PT/INR  No results for input(s): LABPROT, INR in the last 72 hours.  ABG  No results for input(s): PHART, HCO3 in the last 72 hours.  Invalid input(s): PCO2, PO2   Studies/Results:  Mr Brain Wo Contrast  Result Date: 04/07/2019 CLINICAL DATA:  Encephalopathy. EXAM: MRI HEAD WITHOUT CONTRAST TECHNIQUE: Multiplanar, multiecho pulse sequences of the brain and surrounding structures were obtained without intravenous contrast. COMPARISON:  Head CT 04/06/2019, brain MRI 02/05/2009 FINDINGS: Brain: Several sequences are motion degraded. There is no evidence of acute infarct. No evidence of intracranial mass. No midline shift or extra-axial fluid collection. Foci of encephalomalacia and chronic hemosiderin deposition within the left frontal lobe at sites of previous hemorrhagic contusions. Advanced scattered T2/FLAIR hyperintensity within the cerebral white matter has progressed since prior MRI 02/05/2009. However, the previously described large right frontal parietal white matter lesion has not significantly changed. A chronic lacunar infarct within the right thalamus is also new from this prior MRI. Mild generalized parenchymal atrophy. Vascular: Flow voids maintained within the proximal large arterial vessels. Skull and upper  cervical spine: Normal marrow signal. Incompletely assessed cervical spondylosis with C4-C5 posterior disc osteophyte complex. Sinuses/Orbits: Imaged globes and orbits demonstrate no acute abnormality. Mild ethmoid sinus mucosal thickening. Trace fluid within right mastoid air cells IMPRESSION: Motion degraded examination. No evidence of acute intracranial abnormality, including acute infarct. Foci of encephalomalacia within the left frontal lobe, sequela of remote hemorrhagic contusions. Advanced signal changes within the white matter have  significantly progressed since remote MRI 02/05/2009. Findings are nonspecific but may reflect advanced chronic small vessel ischemic disease. Alternative etiologies such as a demyelinating process cannot be excluded. Chronic right thalamic lacunar infarct, new from prior exam. Electronically Signed   By: Kellie Simmering   On: 04/07/2019 14:30   [place] Ekg Site Rite  Result Date: 04/08/2019 If Site Rite image not attached, placement could not be confirmed due to current cardiac rhythm.    Anti-infectives:   Anti-infectives (From admission, onward)   Start     Dose/Rate Route Frequency Ordered Stop   04/06/19 1400  cefTRIAXone (ROCEPHIN) 2 g in sodium chloride 0.9 % 100 mL IVPB     2 g 200 mL/hr over 30 Minutes Intravenous Every 24 hours 04/05/19 1729     04/06/19 0000  metroNIDAZOLE (FLAGYL) IVPB 500 mg     500 mg 100 mL/hr over 60 Minutes Intravenous Every 8 hours 04/05/19 1729     04/05/19 1500  cefTRIAXone (ROCEPHIN) 2 g in sodium chloride 0.9 % 100 mL IVPB     2 g 200 mL/hr over 30 Minutes Intravenous  Once 04/05/19 1450 04/05/19 1547   04/05/19 1445  ceFEPIme (MAXIPIME) 2 g in sodium chloride 0.9 % 100 mL IVPB  Status:  Discontinued     2 g 200 mL/hr over 30 Minutes Intravenous Every 12 hours 04/05/19 1444 04/05/19 1450   04/05/19 1445  metroNIDAZOLE (FLAGYL) IVPB 500 mg     500 mg 100 mL/hr over 60 Minutes Intravenous  Once 04/05/19 1444 04/05/19 1659   04/05/19 0945  cephALEXin (KEFLEX) capsule 500 mg     500 mg Oral  Once 04/05/19 0937 04/05/19 0950      Alphonsa Overall, MD, FACS Pager: [facility] Office: [phone] 04/09/2019

## 2019-04-09 NOTE — Progress Notes (Signed)
[facility] NOTE   Pharmacy Consult for TPN Indication: Diverticulitis with fistula  Patient Measurements: Height: 5\' 3"  (160 cm) Weight: 94 lb 2.2 oz (42.7 kg) IBW/kg (Calculated) : 52.4 TPN AdjBW (KG): 42.7 Body mass index is 16.68 kg/m.   Assessment:  [name] is a 68 y.o. female with medical history significant of hypertension, hypothyroidism, alcoholism, anxiety, and duodenal ulcer perforation s/p surgical repair; presents with complaints of abdominal pain over the last 3-4 days.  History is obtained from the patient's husband over the phone as the patient is confused.  Pain is located in the left lower quadrant of her abdomen.  She had reportedly gotten sick 2 weeks ago and had thrown up once. Since that time he had put her on a soft diet.  She has been doing okay, but over the last last week had stopped eating altogether. Husband denies any knowledge of any blood in stool, fever, or chest pain symptoms. She has not drank alcohol in quite some time.  Pt admitted with sigmoid diverticulitis with large abscess.   9/15- s/p diverticular abscess drain placement (apiration yielded 80 ml frank pus)  9/18 Feculent material coming out of drain  GI: Drain output 60 ml, prealbumin < 5, LBM 9/18, On CLD Endo: CBG 102 > 137 Insulin requirements in the past 24 hours: 0 units Lytes: Na 127 > 125, K 3.2, Mag 1.8, Phos 2.9 Renal: Scr 0.41, urine occurrence x6, net +1.5L Pulm: RA Cards: no issues Hepatobil: LFTs WNL, TG 45 Neuro: confused ID: On D#5 ceftriaxone + Flagyl for intraabdominal infection, GPC in abscess Cx, afebrile, WBC up 11.9  TPN Access: double lumen PICC placed 9/18 TPN start date: 9/18 Nutritional Goals (per RD recommendation on 9/18): Kcal:  1700-1900 Protein:  85-100 grams Fluid:  > 1.7 L  Goal TPN rate is 75 ml/hr (provides 90 g of protein, 247 g of dextrose, and 62 g of lipids which provides 1760 kCals per  day)  Current Nutrition:  CLD: 0-25% Boost Breeze tid (each provides 250 kcal, 9 g protein) - 2 of 3 consumed 9/18 TPN  Plan:  Increase TPN to 60 mL/hr. This TPN provides 72 g of protein, 197 g of dextrose, and 50 g of lipids which provides 1462 kCals per day, meeting 86% of patient needs Electrolytes in TPN: Increase Na, otherwise standard, Max CL Add MVI, trace elements to TPN Add sensitive SSI and adjust as needed Stop IVMF per Dr Candiss Norse Monitor TPN labs F/U CBGs, lytes - high risk for refeeding  KPhos 10 mmol IV x1 KCl 10 meq IV x4 Mag 2 g IV x1   Thank you for involving pharmacy in this patient's care.  Renold Genta, PharmD, BCPS Clinical Pharmacist Clinical phone for 04/09/2019 until 3p is [phone] 04/09/2019 6:59 AM  **Pharmacist phone directory can be found on [email or web address] listed under [place]**

## 2019-04-10 LAB — COMPREHENSIVE METABOLIC PANEL
ALT: 16 U/L (ref 0–44)
AST: 26 U/L (ref 15–41)
Albumin: 2.6 g/dL — ABNORMAL LOW (ref 3.5–5.0)
Alkaline Phosphatase: 69 U/L (ref 38–126)
Anion gap: 6 (ref 5–15)
BUN: 6 mg/dL — ABNORMAL LOW (ref 8–23)
CO2: 25 mmol/L (ref 22–32)
Calcium: 7.9 mg/dL — ABNORMAL LOW (ref 8.9–10.3)
Chloride: 99 mmol/L (ref 98–111)
Creatinine, Ser: 0.38 mg/dL — ABNORMAL LOW (ref 0.44–1.00)
GFR calc Af Amer: >60 mL/min (ref >60)
GFR calc non Af Amer: >60 mL/min (ref >60)
Glucose, Bld: 130 mg/dL — ABNORMAL HIGH (ref 70–99)
Potassium: 3.9 mmol/L (ref 3.5–5.1)
Sodium: 130 mmol/L — ABNORMAL LOW (ref 135–145)
Total Bilirubin: 0.2 mg/dL — ABNORMAL LOW (ref 0.3–1.2)
Total Protein: 5.9 g/dL — ABNORMAL LOW (ref 6.5–8.1)

## 2019-04-10 LAB — CBC WITH DIFFERENTIAL/PLATELET
Abs Immature Granulocytes: 0.07 10*3/uL (ref 0.00–0.07)
Basophils Absolute: 0 10*3/uL (ref 0.0–0.1)
Basophils Relative: 0 %
Eosinophils Absolute: 0 10*3/uL (ref 0.0–0.5)
Eosinophils Relative: 0 %
HCT: 36.9 % (ref 36.0–46.0)
Hemoglobin: 13 g/dL (ref 12.0–15.0)
Immature Granulocytes: 1 %
Lymphocytes Relative: 11 %
Lymphs Abs: 1.1 10*3/uL (ref 0.7–4.0)
MCH: 33.3 pg (ref 26.0–34.0)
MCHC: 35.2 g/dL (ref 30.0–36.0)
MCV: 94.6 fL (ref 80.0–100.0)
Monocytes Absolute: 0.9 10*3/uL (ref 0.1–1.0)
Monocytes Relative: 9 %
Neutro Abs: 7.7 10*3/uL (ref 1.7–7.7)
Neutrophils Relative %: 79 %
Platelets: 391 10*3/uL (ref 150–400)
RBC: 3.9 MIL/uL (ref 3.87–5.11)
RDW: 12.9 % (ref 11.5–15.5)
WBC: 9.9 10*3/uL (ref 4.0–10.5)
nRBC: 0 % (ref 0.0–0.2)

## 2019-04-10 LAB — MAGNESIUM: Magnesium: 2.3 mg/dL (ref 1.7–2.4)

## 2019-04-10 LAB — GLUCOSE, CAPILLARY
Glucose-Capillary: 132 mg/dL — ABNORMAL HIGH (ref 70–99)
Glucose-Capillary: 122 mg/dL — ABNORMAL HIGH (ref 70–99)
Glucose-Capillary: 108 mg/dL — ABNORMAL HIGH (ref 70–99)
Glucose-Capillary: 78 mg/dL (ref 70–99)
Glucose-Capillary: 119 mg/dL — ABNORMAL HIGH (ref 70–99)

## 2019-04-10 LAB — C-REACTIVE PROTEIN: CRP: 4.1 mg/dL — ABNORMAL HIGH (ref <1.0)

## 2019-04-10 LAB — PHOSPHORUS: Phosphorus: 3.1 mg/dL (ref 2.5–4.6)

## 2019-04-10 MED ORDER — FUROSEMIDE 10 MG/ML IJ SOLN
20.0000 mg | Freq: Once | INTRAMUSCULAR | Status: AC
  Administered 2019-04-10: 20 mg via INTRAVENOUS
  Filled 2019-04-10: qty 2

## 2019-04-10 MED ORDER — TRACE MINERALS CR-CU-MN-SE-ZN 10-1000-500-60 MCG/ML IV SOLN
INTRAVENOUS | Status: AC
  Administered 2019-04-10: 18:00:00 via INTRAVENOUS
  Filled 2019-04-10: qty 598.4

## 2019-04-10 NOTE — Progress Notes (Signed)
[facility] NOTE   Pharmacy Consult for TPN Indication: Diverticulitis with fistula  Patient Measurements: Height: 5\' 3"  (160 cm) Weight: 94 lb 2.2 oz (42.7 kg) IBW/kg (Calculated) : 52.4 TPN AdjBW (KG): 42.7 Body mass index is 16.68 kg/m.   Assessment:  [name] is a 68 y.o. female with medical history significant of hypertension, hypothyroidism, alcoholism, anxiety, and duodenal ulcer perforation s/p surgical repair; presents with complaints of abdominal pain over the last 3-4 days.  History is obtained from the patient's husband over the phone as the patient is confused.  Pain is located in the left lower quadrant of her abdomen.  She had reportedly gotten sick 2 weeks ago and had thrown up once. Since that time he had put her on a soft diet.  She has been doing okay, but over the last last week had stopped eating altogether. Husband denies any knowledge of any blood in stool, fever, or chest pain symptoms. She has not drank alcohol in quite some time.  Pt admitted with sigmoid diverticulitis with large abscess.   9/15- s/p diverticular abscess drain placement (apiration yielded 80 ml frank pus)  9/18 Feculent material coming out of drain  GI: JP drain output 310 ml, prealbumin < 5, LBM 9/18, On CLD Endo: CBGs 78-149 Insulin requirements in the past 24 hours: 4 units Lytes: Na 125 > 130, K 3.9 s/p replacement, Mag 2.3 s/p replacment, Phos 3.1 s/p replacement Renal: Scr 0.38, urine occurrence x4, net +1.6L; SIADH suspected Pulm: RA Cards: no issues Hepatobil: LFTs WNL, TG 45 Neuro: confused ID: On D#6 ceftriaxone + Flagyl for intraabdominal infection, abscess Cx - multiple organisms, none predominant, afebrile, WBC down 9.9  TPN Access: double lumen PICC placed 9/18 TPN start date: 9/18 Nutritional Goals (per RD recommendation on 9/18): Kcal:  1700-1900 Protein:  85-100 grams Fluid:  > 1.7 L  Goal concentrated TPN rate is 55  ml/hr (provides 90 g of protein, 265 g of dextrose, and 60 g of lipids which provides 1876 kCals per day)  Current Nutrition:  CLD: 0-25% Boost Breeze tid (each provides 250 kcal, 9 g protein) - 2 of 3 consumed 9/19 TPN  Plan:  Change to concentrated TPN at goal of 55 mL/hr. This TPN provides 90 g of protein, 265 g of dextrose, and 60 g of lipids which provides 1876 kCals per day, meeting 100% of patient needs Electrolytes in TPN: standard lytes, GB:TDVVOHY 1:1 Add MVI, trace elements to TPN Continue sensitive SSI and adjust as needed Monitor TPN labs F/U CBGs, lytes - high risk for refeeding   Thank you for involving pharmacy in this patient's care.  Renold Genta, PharmD, BCPS Clinical Pharmacist Clinical phone for 04/10/2019 until 3p is [phone] 04/10/2019 7:02 AM  **Pharmacist phone directory can be found on [email or web address] listed under [email or web address]**

## 2019-04-10 NOTE — Progress Notes (Signed)
PROGRESS NOTE                                                                                                                                                                                                             Patient Demographics:    [name], is a 68 y.o. female, DOB - 01/16/1951, ZOX:[id number]  Admit date - 04/04/2019   Admitting Physician Clydie Braunondell A Smith, MD  Outpatient Primary MD for the patient is Gordy SaversKwiatkowski, Peter F, MD  LOS - 5  Chief Complaint  Patient presents with   Abdominal Pain       Brief Narrative  [name] is a 68 y.o. female with medical history significant of hypertension, hypothyroidism, alcoholism, anxiety, and duodenal ulcer perforation s/p surgical repair; presents with complaints of abdominal pain over the last 3-4 days.  Apparently patient has been confused for several months which had gotten worse in the last 3 to 4 weeks per husband, he did not seek medical help and could not give me a good reason for why he did not seek help.  I discussed with patient's husband her medical situation on 04/06/2019 according to the husband patient has been complaining of abdominal pain for the last few days prior to hospital admission, she was heavily drinking up till about 1 month ago and still drinking a few drinks till 1 week ago.  In the ER she was diagnosed with diverticular abscess, IR and general surgery were consulted and she was admitted.   Subjective:   Patient in bed, appears comfortable, denies any headache, no fever, no chest pain or pressure, no shortness of breath , no abdominal pain. No focal weakness.   Assessment  & Plan :     1.  Diverticular abscess.  IR consulted along with general surgery, does have a left lower quadrant JP drain which was placed on 04/05/2019.  She is continued on empiric IV Rocephin, general surgery on board, currently on clear liquids along with IV TNA via PICC line. Abscess cultures inconclusive with multiple  organisms but clinical response is good to IV Rocephin which will be continued.  2.  Alcohol abuse.  Ongoing.  No signs of DTs, CIWA protocol.  Counseled to quit.  3.  Acute metabolic encephalopathy on top of chronic encephalopathy with previous history of CVA.  Seems to have this problem for several months, no help was sought by husband.  Stable B12, TSH, RPR and folic acid levels, nonacute CT head and MRI brain  showing old left frontal CVA.  EEG unremarkable.  For now IV Thiamine x 5 days as this could be Wernicke's encephalopathy with her longstanding history of alcohol abuse.  4.  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  May require Samsca.  5.  History of malabsorption. Stable anemia panel along with B12, folic acid levels pending.    Family Communication  :  Husband 9/16, again bedside on 9/18, 9/19, 9/20  Code Status :  Full  Disposition Plan  :  TBD  Consults  :  CCS, IR  Procedures  :    MRI  EEG  CT -  1. Sigmoid diverticulitis with a large diverticular abscess. There is loss of fat plane between the superior uterus and diverticular abscess. 2. A 15 mm high attenuating structure in the anterior upper abdomen appears similar to prior CT and may represent the duodenal bulb. Aortic Atherosclerosis.   DVT Prophylaxis  :  Lovenox   Lab Results  Component Value Date   PLT 391 04/10/2019    Diet :  Diet Order            Diet clear liquid Room service appropriate? Yes; Fluid consistency: Thin  Diet effective now               Inpatient Medications Scheduled Meds:  aspirin  81 mg Oral Daily   Chlorhexidine Gluconate Cloth  6 each Topical Daily   docusate sodium  200 mg Oral BID   enoxaparin (LOVENOX) injection  40 mg Subcutaneous Q24H   feeding supplement  1 Container Oral TID BM   folic acid  1 mg Oral Daily   influenza vaccine adjuvanted  0.5 mL Intramuscular Tomorrow-1000   insulin aspart  0-9 Units Subcutaneous Q4H    LORazepam  0-4 mg Intravenous Q12H   multivitamin with minerals  1 tablet Oral Daily   pantoprazole (PROTONIX) IV  40 mg Intravenous Q12H   sodium chloride flush  5 mL Intracatheter Q8H   [START ON 04/13/2019] thiamine  100 mg Oral Daily   Continuous Infusions:  cefTRIAXone (ROCEPHIN)  IV 2 g (04/09/19 1403)   metronidazole 500 mg (04/10/19 0823)   thiamine injection 500 mg (04/09/19 1031)   TPN ADULT (ION) 60 mL/hr at 04/09/19 1830   TPN ADULT (ION)     PRN Meds:.acetaminophen **OR** acetaminophen, haloperidol lactate, hydrALAZINE, ondansetron **OR** ondansetron (ZOFRAN) IV, sodium chloride flush  Antibiotics  :   Anti-infectives (From admission, onward)   Start     Dose/Rate Route Frequency Ordered Stop   04/06/19 1400  cefTRIAXone (ROCEPHIN) 2 g in sodium chloride 0.9 % 100 mL IVPB     2 g 200 mL/hr over 30 Minutes Intravenous Every 24 hours 04/05/19 1729     04/06/19 0000  metroNIDAZOLE (FLAGYL) IVPB 500 mg     500 mg 100 mL/hr over 60 Minutes Intravenous Every 8 hours 04/05/19 1729     04/05/19 1500  cefTRIAXone (ROCEPHIN) 2 g in sodium chloride 0.9 % 100 mL IVPB     2 g 200 mL/hr over 30 Minutes Intravenous  Once 04/05/19 1450 04/05/19 1547   04/05/19 1445  ceFEPIme (MAXIPIME) 2 g in sodium chloride 0.9 % 100 mL IVPB  Status:  Discontinued     2 g 200 mL/hr over 30 Minutes Intravenous Every 12 hours 04/05/19 1444 04/05/19 1450   04/05/19 1445  metroNIDAZOLE (FLAGYL) IVPB 500 mg     500 mg 100 mL/hr over 60 Minutes Intravenous  Once  04/05/19 1444 04/05/19 1659   04/05/19 0945  cephALEXin (KEFLEX) capsule 500 mg     500 mg Oral  Once 04/05/19 0937 04/05/19 0950          Objective:   Vitals:   04/08/19 2011 04/09/19 0404 04/09/19 1547 04/09/19 2001  BP: (!) 144/91 (!) 142/87 126/70 129/84  Pulse: 72 78 74 72  Resp: 17 18 16 15   Temp: 97.8 F (36.6 C) 98 F (36.7 C) 99.4 F (37.4 C) 98.8 F (37.1 C)  TempSrc: Oral Oral Oral Oral  SpO2: 98% 97% 98% 98%    Weight:      Height:        Wt Readings from Last 3 Encounters:  04/06/19 42.7 kg  01/27/18 42.7 kg  10/20/17 41.3 kg     Intake/Output Summary (Last 24 hours) at 04/10/2019 1003 Last data filed at 04/10/2019 0859 Gross per 24 hour  Intake 1550.49 ml  Output 310 ml  Net 1240.49 ml     Physical Exam  Awake less confused today, no focal deficits [place].AT,PERRAL Supple Neck,No JVD, No cervical lymphadenopathy appriciated.  Symmetrical Chest wall movement, Good air movement bilaterally, CTAB RRR,No Gallops, Rubs or new Murmurs, No Parasternal Heave +ve B.Sounds, Abd Soft, No tenderness, No organomegaly appriciated, No rebound - guarding or rigidity.  JP drain in place with some feculent discharge. No Cyanosis, Clubbing or edema, No new Rash or bruise         Data Review:    CBC Recent Labs  Lab 04/06/19 0404 04/07/19 0212 04/07/19 1139 04/08/19 0219 04/09/19 0452 04/10/19 0423  WBC 14.5* 9.6  --  8.1 11.9* 9.9  HGB 14.0 13.3  --  14.0 13.1 13.0  HCT 38.3 37.6 38.3 37.8 36.1 36.9  PLT 381 406*  --  379 375 391  MCV 92.7 93.3  --  92.2 92.8 94.6  MCH 33.9 33.0  --  34.1* 33.7 33.3  MCHC 36.6* 35.4  --  37.0* 36.3* 35.2  RDW 12.3 12.1  --  12.3 12.3 12.9  LYMPHSABS  --  1.2  --  1.5 1.0 1.1  MONOABS  --  1.1*  --  0.9 0.9 0.9  EOSABS  --  0.0  --  0.0 0.0 0.0  BASOSABS  --  0.1  --  0.1 0.0 0.0    Chemistries  Recent Labs  Lab 04/06/19 0404 04/06/19 1217 04/07/19 0212 04/08/19 0219 04/09/19 0452 04/10/19 0423  NA 128*  --  129* 127* 125* 130*  K 3.4*  --  3.5 3.4* 3.2* 3.9  CL 93*  --  96* 92* 90* 99  CO2 21*  --  23 23 25 25   GLUCOSE 81  --  139* 102* 137* 130*  BUN <5*  --  5* <5* <5* 6*  CREATININE 0.50  --  0.53 0.48 0.41* 0.38*  CALCIUM 8.0*  --  7.8* 8.1* 8.1* 7.9*  MG  --  1.8 1.8 1.8 1.8 2.3  AST  --  19 18 16 21 26   ALT  --  14 13 14 15 16   ALKPHOS  --  86 84 75 68 69  BILITOT  --  0.8 0.4 0.5 0.3 0.2*    ------------------------------------------------------------------------------------------------------------------ Recent Labs    04/09/19 0452  TRIG 45    Lab Results  Component Value Date   HGBA1C 5.8 (H) 04/06/2019   ------------------------------------------------------------------------------------------------------------------ No results for input(s): TSH, T4TOTAL, T3FREE, THYROIDAB in the last 72 hours.  Invalid input(s): FREET3 ------------------------------------------------------------------------------------------------------------------  Recent Labs    04/07/19 1139  FOLATE 11.5  FERRITIN 139    Coagulation profile Recent Labs  Lab 04/06/19 0404  INR 1.2    No results for input(s): DDIMER in the last 72 hours.  Cardiac Enzymes No results for input(s): CKMB, TROPONINI, MYOGLOBIN in the last 168 hours.  Invalid input(s): CK ------------------------------------------------------------------------------------------------------------------ No results found for: BNP  Micro Results Recent Results (from the past 240 hour(s))  Urine culture     Status: Abnormal   Collection Time: 04/05/19  8:45 AM   Specimen: Urine, Random  Result Value Ref Range Status   Specimen Description URINE, RANDOM  Final   Special Requests NONE  Final   Culture (A)  Final    <10,000 COLONIES/mL INSIGNIFICANT GROWTH Performed at [facility], [address], [place], [place] [address]    Report Status 04/06/2019 FINAL  Final  SARS Coronavirus 2 [facility] order, Performed in [facility] hospital lab) Nasopharyngeal Nasopharyngeal Swab     Status: None   Collection Time: 04/05/19  2:45 PM   Specimen: Nasopharyngeal Swab  Result Value Ref Range Status   SARS Coronavirus 2 NEGATIVE NEGATIVE Final    Comment: (NOTE) If result is NEGATIVE SARS-CoV-2 target nucleic acids are NOT DETECTED. The SARS-CoV-2 RNA is generally detectable in upper and lower  respiratory specimens  during the acute phase of infection. The lowest  concentration of SARS-CoV-2 viral copies this assay can detect is 250  copies / mL. A negative result does not preclude SARS-CoV-2 infection  and should not be used as the sole basis for treatment or other  patient management decisions.  A negative result may occur with  improper specimen collection / handling, submission of specimen other  than nasopharyngeal swab, presence of viral mutation(s) within the  areas targeted by this assay, and inadequate number of viral copies  (<250 copies / mL). A negative result must be combined with clinical  observations, patient history, and epidemiological information. If result is POSITIVE SARS-CoV-2 target nucleic acids are DETECTED. The SARS-CoV-2 RNA is generally detectable in upper and lower  respiratory specimens dur ing the acute phase of infection.  Positive  results are indicative of active infection with SARS-CoV-2.  Clinical  correlation with patient history and other diagnostic information is  necessary to determine patient infection status.  Positive results do  not rule out bacterial infection or co-infection with other viruses. If result is PRESUMPTIVE POSTIVE SARS-CoV-2 nucleic acids MAY BE PRESENT.   A presumptive positive result was obtained on the submitted specimen  and confirmed on repeat testing.  While 2019 novel coronavirus  (SARS-CoV-2) nucleic acids may be present in the submitted sample  additional confirmatory testing may be necessary for epidemiological  and / or clinical management purposes  to differentiate between  SARS-CoV-2 and other Sarbecovirus currently known to infect humans.  If clinically indicated additional testing with an alternate test  methodology [id number]) is advised. The SARS-CoV-2 RNA is generally  detectable in upper and lower respiratory sp ecimens during the acute  phase of infection. The expected result is Negative. Fact Sheet for Patients:   StrictlyIdeas.no Fact Sheet for Healthcare Providers: BankingDealers.co.za This test is not yet approved or cleared by the [place] FDA and has been authorized for detection and/or diagnosis of SARS-CoV-2 by FDA under an Emergency Use Authorization (EUA).  This EUA will remain in effect (meaning this test can be used) for the duration of the COVID-19 declaration under Section 564(b)(1) of the  Act, 21 U.S.C. section 360bbb-3(b)(1), unless the authorization is terminated or revoked sooner. Performed at [facility], [address], [place], [place] [address]   Aerobic/Anaerobic Culture (surgical/deep wound)     Status: Abnormal   Collection Time: 04/05/19  6:49 PM   Specimen: Abscess  Result Value Ref Range Status   Specimen Description ABSCESS  Final   Special Requests Normal  Final   Gram Stain   Final    FEW WBC PRESENT, PREDOMINANTLY PMN MODERATE GRAM POSITIVE COCCI Performed at [facility], [address], [place], [place] [address]    Culture (A)  Final    MULTIPLE ORGANISMS PRESENT, NONE PREDOMINANT MIXED ANAEROBIC FLORA PRESENT.  CALL LAB IF FURTHER IID REQUIRED.    Report Status 04/09/2019 FINAL  Final    Radiology Reports Ct Head Wo Contrast  Result Date: 04/06/2019 CLINICAL DATA:  Altered level of consciousness today. EXAM: CT HEAD WITHOUT CONTRAST TECHNIQUE: Contiguous axial images were obtained from the base of the skull through the vertex without intravenous contrast. COMPARISON:  Head CT scan 06/04/2014. FINDINGS: Brain: No evidence of acute infarction, hemorrhage, hydrocephalus, extra-axial collection or mass lesion/mass effect. Chronic microvascular ischemic change has markedly worsened since the prior head CT. Left frontal infarct is remote but new since the prior CT. Vascular: No hyperdense vessel or unexpected calcification. Skull: Intact.  No focal lesion. Sinuses/Orbits: Negative. Other: None.  IMPRESSION: No acute abnormality. Extensive chronic microvascular ischemic change. Remote left frontal infarct noted. Electronically Signed   By: Drusilla Kanner M.D.   On: 04/06/2019 13:34   Mr Brain Wo Contrast  Result Date: 04/07/2019 CLINICAL DATA:  Encephalopathy. EXAM: MRI HEAD WITHOUT CONTRAST TECHNIQUE: Multiplanar, multiecho pulse sequences of the brain and surrounding structures were obtained without intravenous contrast. COMPARISON:  Head CT 04/06/2019, brain MRI 02/05/2009 FINDINGS: Brain: Several sequences are motion degraded. There is no evidence of acute infarct. No evidence of intracranial mass. No midline shift or extra-axial fluid collection. Foci of encephalomalacia and chronic hemosiderin deposition within the left frontal lobe at sites of previous hemorrhagic contusions. Advanced scattered T2/FLAIR hyperintensity within the cerebral white matter has progressed since prior MRI 02/05/2009. However, the previously described large right frontal parietal white matter lesion has not significantly changed. A chronic lacunar infarct within the right thalamus is also new from this prior MRI. Mild generalized parenchymal atrophy. Vascular: Flow voids maintained within the proximal large arterial vessels. Skull and upper cervical spine: Normal marrow signal. Incompletely assessed cervical spondylosis with C4-C5 posterior disc osteophyte complex. Sinuses/Orbits: Imaged globes and orbits demonstrate no acute abnormality. Mild ethmoid sinus mucosal thickening. Trace fluid within right mastoid air cells IMPRESSION: Motion degraded examination. No evidence of acute intracranial abnormality, including acute infarct. Foci of encephalomalacia within the left frontal lobe, sequela of remote hemorrhagic contusions. Advanced signal changes within the white matter have significantly progressed since remote MRI 02/05/2009. Findings are nonspecific but may reflect advanced chronic small vessel ischemic disease.  Alternative etiologies such as a demyelinating process cannot be excluded. Chronic right thalamic lacunar infarct, new from prior exam. Electronically Signed   By: Jackey Loge   On: 04/07/2019 14:30   Ct Abdomen Pelvis W Contrast  Result Date: 04/05/2019 CLINICAL DATA:  68 year old female with abdominal pain. History of prior perforated duodenal ulcer and exclude Terri laparotomy and pyloroplasty. EXAM: CT ABDOMEN AND PELVIS WITH CONTRAST TECHNIQUE: Multidetector CT imaging of the abdomen and pelvis was performed using the standard protocol following bolus administration of intravenous contrast.  CONTRAST:  OMNIPAQUE IOHEXOL 300 MG/ML  SOLN COMPARISON:  CT of the abdomen pelvis dated 08/05/2011. FINDINGS: Evaluation of this exam is limited due to respiratory motion artifact. Lower chest: The visualized lung bases are clear. Minimal right middle lobe atelectasis/scarring. There is mild cardiomegaly. Coronary vascular calcifications noted. No definite intra-abdominal free air or free fluid. Hepatobiliary: A 1 cm hypodense lesion in the dome of the liver is not well characterized but appears similar to prior CT, likely a cyst. The liver is otherwise unremarkable. There is mild intrahepatic biliary ductal dilatation. No calcified gallstone or pericholecystic fluid. Pancreas: Unremarkable. No pancreatic ductal dilatation or surrounding inflammatory changes. Spleen: Normal in size without focal abnormality. Adrenals/Urinary Tract: The adrenal glands are unremarkable. There is no hydronephrosis on either side. There is symmetric enhancement and excretion of contrast by both kidneys. Subcentimeter left renal hypodense focus is too small to characterize but likely represents a cyst. The urinary bladder is unremarkable. Stomach/Bowel: Extensive sigmoid diverticulosis with active inflammatory changes. There is a 9.7 x 3.7 x 3.8 cm diverticular abscess along the sigmoid colon. Postsurgical changes of  gastrojejunostomy. No evidence of bowel obstruction. A fluid containing structure with pockets of air in the upper abdomen anteriorly (series 3, image 27) appears similar to the CT of 2013 and may represent the duodenal bulb. A 15 mm high attenuating structure within this may represent a chronic stone although an enhancing lesion is not entirely excluded. Vascular/Lymphatic: Moderate aortoiliac atherosclerotic disease. The IVC is unremarkable. No portal venous gas. There is no adenopathy. Reproductive: The uterus is grossly unremarkable. There is loss of fat plane between the superior uterus and diverticular abscess of the sigmoid colon. No adnexal masses. Other: None Musculoskeletal: Osteopenia. No acute osseous pathology. IMPRESSION: 1. Sigmoid diverticulitis with a large diverticular abscess. There is loss of fat plane between the superior uterus and diverticular abscess. 2. A 15 mm high attenuating structure in the anterior upper abdomen appears similar to prior CT and may represent the duodenal bulb. Aortic Atherosclerosis (ICD10-I70.0). Electronically Signed   By: Elgie Collard M.D.   On: 04/05/2019 14:14   Ct Image Guided Drainage By Percutaneous Catheter  Result Date: 04/06/2019 INDICATION: 68 year old female with sigmoid colonic diverticulitis and large intramural abscess. She presents for CT-guided drain placement. EXAM: CT-guided drain placement MEDICATIONS: The patient is currently admitted to the hospital and receiving intravenous antibiotics. The antibiotics were administered within an appropriate time frame prior to the initiation of the procedure. ANESTHESIA/SEDATION: Fentanyl 100 mcg IV; Versed 2 mg IV Moderate Sedation Time:  17 minutes The patient was continuously monitored during the procedure by the interventional radiology nurse under my direct supervision. COMPLICATIONS: None immediate. PROCEDURE: Informed written consent was obtained from the patient after a thorough discussion of the  procedural risks, benefits and alternatives. All questions were addressed. Maximal Sterile Barrier Technique was utilized including caps, mask, sterile gowns, sterile gloves, sterile drape, hand hygiene and skin antiseptic. A timeout was performed prior to the initiation of the procedure. A planning axial CT scan was performed. The fluid and gas collection in the left lower quadrant of the anatomic pelvis position between the sigmoid colon, uterus and bladder was successfully identified. A suitable skin entry site was selected and marked. Care was taken to ensure that the entry site was below the redundant portion of the sigmoid colon. After sterile prep and drape in the standard fashion with chlorhexidine, local anesthesia was attained by infiltration with 1% lidocaine. A small dermatotomy was made. Under  intermittent CT guidance, an 18 gauge trocar needle was advanced into the fluid and gas collection. A 0.035 wire was then advanced into the fluid and gas collection. The needle was exchanged for a fascial dilator and the soft tissue tract was dilated to 12 [place]. A Cook 12 [place] all-purpose drainage catheter modified with additional sideholes was then advanced over the wire and formed. Aspiration yields approximately 80 cc of thick purulent material. A sample was sent for Gram stain and culture. The abscess cavity was lavaged with sterile saline and the drain connected to JP bulb suction. The drain was then secured to the skin with 0 Prolene suture and an adhesive fixation device. The patient tolerated the procedure well. IMPRESSION: Successful placement of a 12 French drainage catheter modified with additional sideholes. Aspiration yields 80 mL of frankly purulent and foul-smelling fluid. Samples were sent for Gram stain and culture. PLAN: Maintain drain to JP bulb suction. Flush drainage catheter at least once per shift. When drain output has decreased significantly, recommend follow-up with both  contrast-enhanced CT scan of the pelvis to assess for resolution of the abscess as well as drain injection to assess for fistula with the sigmoid colon given the intramural location of the abscess. Signed, Sterling Big, MD, RPVI [facility] Electronically Signed   By: Malachy Moan M.D.   On: 04/06/2019 12:42   [place] Ekg Site Rite  Result Date: 04/08/2019 If Site Rite image not attached, placement could not be confirmed due to current cardiac rhythm.   Time Spent in minutes  30   Susa Raring M.D on 04/10/2019 at 10:03 AM  To page go to [email or web address] - password [email or web address]

## 2019-04-10 NOTE — Progress Notes (Addendum)
[facility]:  [phone] General Surgery Progress Note   LOS: 5 days  POD -     Chief Complaint: Abdominal pain  Assessment and Plan: 1.  Severe sigmoid diverticulitis with 9.7 x 3.7 x 3.8 cm diverticular abscess IR drain placement 9/15   Rocephin/Flagyl - 9/15 >>>  WBC - 9,900 - 04/10/2019  On clear liquids  Looks a lot better today - slept most of night, up walking  2.  Confusion  EtOH related ???  Required Haldol last PM.  She did not go to sleep until 3 AM.  She has her nights and days backwards. 3.  Severemalnutrition-   Prealbumin <5  On TPN - started 9/18 4.  Hx hypertension 5.  Hx malabsorption syndrome 6.  History of alcoholism 7.  S/p duodenal ulcer repair  Perforated ulcer - 2006 - [facility], Revision of GJ - 2013 - Zackary Mckeone 8.  Anxiety/insomnia 9.  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  DVT prophylaxis - Lovenox   Principal Problem:   Diverticulitis of large intestine with abscess Active Problems:   Anxiety state   Essential hypertension   Hyponatremia   Dementia (HCC)  Subjective:  Clearly better over the last 24 hours.  Up out of bed and more "with it"  Husband at bedside.  Objective:   Vitals:   04/09/19 1547 04/09/19 2001  BP: 126/70 129/84  Pulse: 74 72  Resp: 16 15  Temp: 99.4 F (37.4 C) 98.8 F (37.1 C)  SpO2: 98% 98%     Intake/Output from previous day:  09/19 0701 - 09/20 0700 In: 477 [P.O.:477] Out: 310 [Drains:310]  Intake/Output this shift:  Total I/O In: 1373.5 [I.V.:864.5; IV Piggyback:509] Out: -    Physical Exam:   General: [name] who is sleepy   HEENT: Normal. Pupils equal. .   Lungs: Clear   Abdomen: Mildly tender lower abdomen.  Drain in LLQ - 310 cc over the last 24 hours   Lab Results:    Recent Labs    04/09/19 0452 04/10/19 0423  WBC 11.9* 9.9  HGB 13.1 13.0  HCT 36.1 36.9  PLT 375 391     BMET   Recent Labs    04/09/19 0452 04/10/19 0423  NA 125* 130*  K 3.2* 3.9  CL 90* 99  CO2 25 25  GLUCOSE 137* 130*  BUN <5* 6*  CREATININE 0.41* 0.38*  CALCIUM 8.1* 7.9*    PT/INR  No results for input(s): LABPROT, INR in the last 72 hours.  ABG  No results for input(s): PHART, HCO3 in the last 72 hours.  Invalid input(s): PCO2, PO2   Studies/Results:  [place] Ekg Site Rite  Result Date: 04/08/2019 If Site Rite image not attached, placement could not be confirmed due to current cardiac rhythm.    Anti-infectives:   Anti-infectives (From admission, onward)   Start     Dose/Rate Route Frequency Ordered Stop   04/06/19 1400  cefTRIAXone (ROCEPHIN) 2 g in sodium chloride 0.9 % 100 mL IVPB     2 g 200 mL/hr over 30 Minutes Intravenous Every 24 hours 04/05/19 1729     04/06/19 0000  metroNIDAZOLE (FLAGYL) IVPB 500 mg     500 mg 100 mL/hr over 60 Minutes Intravenous Every 8 hours 04/05/19 1729     04/05/19 1500  cefTRIAXone (ROCEPHIN) 2 g in sodium chloride 0.9 % 100 mL IVPB     2 g  200 mL/hr over 30 Minutes Intravenous  Once 04/05/19 1450 04/05/19 1547   04/05/19 1445  ceFEPIme (MAXIPIME) 2 g in sodium chloride 0.9 % 100 mL IVPB  Status:  Discontinued     2 g 200 mL/hr over 30 Minutes Intravenous Every 12 hours 04/05/19 1444 04/05/19 1450   04/05/19 1445  metroNIDAZOLE (FLAGYL) IVPB 500 mg     500 mg 100 mL/hr over 60 Minutes Intravenous  Once 04/05/19 1444 04/05/19 1659   04/05/19 0945  cephALEXin (KEFLEX) capsule 500 mg     500 mg Oral  Once 04/05/19 5859 04/05/19 0950      Alphonsa Overall, MD, FACS Pager: [facility] Office: [phone] 04/10/2019

## 2019-04-11 LAB — COMPREHENSIVE METABOLIC PANEL
ALT: 22 U/L (ref 0–44)
AST: 37 U/L (ref 15–41)
Albumin: 2.6 g/dL — ABNORMAL LOW (ref 3.5–5.0)
Alkaline Phosphatase: 64 U/L (ref 38–126)
Anion gap: 8 (ref 5–15)
BUN: 9 mg/dL (ref 8–23)
CO2: 26 mmol/L (ref 22–32)
Calcium: 8 mg/dL — ABNORMAL LOW (ref 8.9–10.3)
Chloride: 96 mmol/L — ABNORMAL LOW (ref 98–111)
Creatinine, Ser: 0.35 mg/dL — ABNORMAL LOW (ref 0.44–1.00)
GFR calc Af Amer: >60 mL/min (ref >60)
GFR calc non Af Amer: >60 mL/min (ref >60)
Glucose, Bld: 122 mg/dL — ABNORMAL HIGH (ref 70–99)
Potassium: 3.7 mmol/L (ref 3.5–5.1)
Sodium: 130 mmol/L — ABNORMAL LOW (ref 135–145)
Total Bilirubin: <0.1 mg/dL — ABNORMAL LOW (ref 0.3–1.2)
Total Protein: 5.2 g/dL — ABNORMAL LOW (ref 6.5–8.1)

## 2019-04-11 LAB — DIFFERENTIAL
Abs Immature Granulocytes: 0.06 10*3/uL (ref 0.00–0.07)
Basophils Absolute: 0 10*3/uL (ref 0.0–0.1)
Basophils Relative: 1 %
Eosinophils Absolute: 0.1 10*3/uL (ref 0.0–0.5)
Eosinophils Relative: 1 %
Immature Granulocytes: 1 %
Lymphocytes Relative: 15 %
Lymphs Abs: 1.2 10*3/uL (ref 0.7–4.0)
Monocytes Absolute: 0.9 10*3/uL (ref 0.1–1.0)
Monocytes Relative: 11 %
Neutro Abs: 5.9 10*3/uL (ref 1.7–7.7)
Neutrophils Relative %: 71 %

## 2019-04-11 LAB — TRIGLYCERIDES: Triglycerides: 55 mg/dL (ref <150)

## 2019-04-11 LAB — CBC
HCT: 34.2 % — ABNORMAL LOW (ref 36.0–46.0)
Hemoglobin: 12 g/dL (ref 12.0–15.0)
MCH: 33.7 pg (ref 26.0–34.0)
MCHC: 35.1 g/dL (ref 30.0–36.0)
MCV: 96.1 fL (ref 80.0–100.0)
Platelets: 387 10*3/uL (ref 150–400)
RBC: 3.56 MIL/uL — ABNORMAL LOW (ref 3.87–5.11)
RDW: 13 % (ref 11.5–15.5)
WBC: 8.2 10*3/uL (ref 4.0–10.5)
nRBC: 0 % (ref 0.0–0.2)

## 2019-04-11 LAB — PHOSPHORUS: Phosphorus: 2.8 mg/dL (ref 2.5–4.6)

## 2019-04-11 LAB — GLUCOSE, CAPILLARY
Glucose-Capillary: 120 mg/dL — ABNORMAL HIGH (ref 70–99)
Glucose-Capillary: 84 mg/dL (ref 70–99)
Glucose-Capillary: 101 mg/dL — ABNORMAL HIGH (ref 70–99)

## 2019-04-11 LAB — PREALBUMIN: Prealbumin: 11.6 mg/dL — ABNORMAL LOW (ref 18–38)

## 2019-04-11 LAB — MAGNESIUM: Magnesium: 2.1 mg/dL (ref 1.7–2.4)

## 2019-04-11 MED ORDER — TRACE MINERALS CR-CU-MN-SE-ZN 10-1000-500-60 MCG/ML IV SOLN
INTRAVENOUS | Status: AC
  Administered 2019-04-11: 18:00:00 via INTRAVENOUS
  Filled 2019-04-11: qty 326.4

## 2019-04-11 MED ORDER — AMLODIPINE BESYLATE 10 MG PO TABS
10.0000 mg | ORAL_TABLET | Freq: Every day | ORAL | Status: DC
  Administered 2019-04-11 – 2019-04-14 (×4): 10 mg via ORAL
  Filled 2019-04-11 (×4): qty 1

## 2019-04-11 MED ORDER — PANTOPRAZOLE SODIUM 40 MG PO TBEC
40.0000 mg | DELAYED_RELEASE_TABLET | Freq: Two times a day (BID) | ORAL | Status: DC
  Administered 2019-04-11 – 2019-04-14 (×6): 40 mg via ORAL
  Filled 2019-04-11 (×6): qty 1

## 2019-04-11 MED ORDER — TOLVAPTAN 15 MG PO TABS
15.0000 mg | ORAL_TABLET | ORAL | Status: DC
  Administered 2019-04-11: 09:00:00 15 mg via ORAL
  Filled 2019-04-11 (×2): qty 1

## 2019-04-11 NOTE — Progress Notes (Addendum)
[facility] Progress Note     Subjective: CC-  Husband at bedside.  Feeling well today. No abdominal complaints. Tolerating clear liquids. Drank Boost/Ensure x3 yesterday. She had multiple loose BMs. Ambulated multiple times yesterday. WBC 8.2, afebrile.   Objective: Vital signs in last 24 hours: Temp:  [98.3 F (36.8 C)-98.4 F (36.9 C)] 98.3 F (36.8 C) (09/20 2246) Pulse Rate:  [71-87] 87 (09/20 2246) Resp:  [16-18] 18 (09/20 2246) BP: (127-152)/(85-88) 152/88 (09/20 2246) SpO2:  [97 %-98 %] 98 % (09/20 2246) Last BM Date: 04/08/19  Intake/Output from previous day: 09/20 0701 - 09/21 0700 In: 3380.7 [P.O.:540; I.V.:1997.1; IV Piggyback:843.6] Out: 80 [Drains:80] Intake/Output this shift: No intake/output data recorded.  PE: Gen:  Alert, NAD, cooperative HEENT: EOM's intact, pupils equal and round Pulm:  Rate and effort normal Abd: Soft, NT/ND, +BS, drain with tan/brown output in bulb (80cc last 24 hours) Ext:  Calves soft and nontender Skin: no rashes noted, warm and dry  Lab Results:  Recent Labs    04/10/19 0423 04/11/19 0428  WBC 9.9 8.2  HGB 13.0 12.0  HCT 36.9 34.2*  PLT 391 387   BMET Recent Labs    04/10/19 0423 04/11/19 0428  NA 130* 130*  K 3.9 3.7  CL 99 96*  CO2 25 26  GLUCOSE 130* 122*  BUN 6* 9  CREATININE 0.38* 0.35*  CALCIUM 7.9* 8.0*   PT/INR No results for input(s): LABPROT, INR in the last 72 hours. CMP     Component Value Date/Time   NA 130 (L) 04/11/2019 0428   K 3.7 04/11/2019 0428   CL 96 (L) 04/11/2019 0428   CO2 26 04/11/2019 0428   GLUCOSE 122 (H) 04/11/2019 0428   BUN 9 04/11/2019 0428   CREATININE 0.35 (L) 04/11/2019 0428   CALCIUM 8.0 (L) 04/11/2019 0428   PROT 5.2 (L) 04/11/2019 0428   ALBUMIN 2.6 (L) 04/11/2019 0428   AST 37 04/11/2019 0428   ALT 22 04/11/2019 0428   ALKPHOS 64 04/11/2019 0428   BILITOT <0.1 (L) 04/11/2019 0428   GFRNONAA >60 04/11/2019 0428   GFRAA >60 04/11/2019 0428    Lipase     Component Value Date/Time   LIPASE 17 04/04/2019 1846       Studies/Results: No results found.  Anti-infectives: Anti-infectives (From admission, onward)   Start     Dose/Rate Route Frequency Ordered Stop   04/06/19 1400  cefTRIAXone (ROCEPHIN) 2 g in sodium chloride 0.9 % 100 mL IVPB     2 g 200 mL/hr over 30 Minutes Intravenous Every 24 hours 04/05/19 1729     04/06/19 0000  metroNIDAZOLE (FLAGYL) IVPB 500 mg     500 mg 100 mL/hr over 60 Minutes Intravenous Every 8 hours 04/05/19 1729     04/05/19 1500  cefTRIAXone (ROCEPHIN) 2 g in sodium chloride 0.9 % 100 mL IVPB     2 g 200 mL/hr over 30 Minutes Intravenous  Once 04/05/19 1450 04/05/19 1547   04/05/19 1445  ceFEPIme (MAXIPIME) 2 g in sodium chloride 0.9 % 100 mL IVPB  Status:  Discontinued     2 g 200 mL/hr over 30 Minutes Intravenous Every 12 hours 04/05/19 1444 04/05/19 1450   04/05/19 1445  metroNIDAZOLE (FLAGYL) IVPB 500 mg     500 mg 100 mL/hr over 60 Minutes Intravenous  Once 04/05/19 1444 04/05/19 1659   04/05/19 0945  cephALEXin (KEFLEX) capsule 500 mg     500 mg Oral  Once  04/05/19 0937 04/05/19 0950       Assessment/Plan HTN H/o alcoholism Anxiety/insomnia 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 Continue TPN H/o Perforated ulcer 2006 s/p duodenal repair [facility], Revision of [place] 2013 [place] Confusion - alcohol related?  Severe sigmoid diverticulitis with 9.7 x 3.7 x 3.8 cm diverticular abscess - IR drain placement 9/15. Culture with multiple organisms present none predominant. - WBC 8.2, afebrile - tolerating clear liquids - drain output tan/brown, fistula  ID - rocephin/flagyl 9/16>> FEN - 1/2 TPN, FLD, Boost/Ensure VTE - SCDs, lovenox Foley - none Follow up - IR, colorectal surgeon  Plan - Advance to full liquids then as tolerated to soft  diet. 1/2 TPN today and may be able to stop this tomorrow if tolerating enough PO. Continue IV antibiotics and drain.    LOS: 6 days    Wellington Hampshire , [facility] 04/11/2019, 9:12 AM Pager: [phone] Mon-Thurs 7:00 am-4:30 pm Fri 7:00 am -11:30 AM Sat-Sun 7:00 am-11:30 am

## 2019-04-11 NOTE — Progress Notes (Signed)
PHARMACY - ADULT TOTAL PARENTERAL NUTRITION CONSULT NOTE   Pharmacy Consult for TPN Indication: Diverticulitis with fistula  Patient Measurements: Height: 5\' 3"  (160 cm) Weight: 94 lb 2.2 oz (42.7 kg) IBW/kg (Calculated) : 52.4 TPN AdjBW (KG): 42.7 Body mass index is 16.68 kg/m.   Assessment:  [name] is a 68 y.o. female with medical history significant of hypertension, hypothyroidism, alcoholism, anxiety, and duodenal ulcer perforation s/p surgical repair; presents with complaints of abdominal pain over the last 3-4 days.  History is obtained from the patient's husband over the phone as the patient is confused.  Pain is located in the left lower quadrant of her abdomen.  She had reportedly gotten sick 2 weeks ago and had thrown up once. Since that time he had put her on a soft diet.  She has been doing okay, but over the last last week had stopped eating altogether. Husband denies any knowledge of any blood in stool, fever, or chest pain symptoms. She has not drank alcohol in quite some time.  Pt admitted with sigmoid diverticulitis with large abscess.   9/15- s/p diverticular abscess drain placement (apiration yielded 80 ml frank pus)  9/18 Feculent material coming out of drain  GI: JP drain output 80 ml, prealbumin up 11.6, LBM 9/20 Endo: CBGs <150 Insulin requirements in the past 24 hours: 1 unit Lytes: Na 125 > 130, K 3.7, Mag 2.1, Phos 2.8 Renal: Scr 0.38, urine occurrence x7, net +1.6L; SIADH suspected Pulm: RA Cards: no issues Hepatobil: LFTs WNL, TG 55 Neuro: confused ID: On D#6 ceftriaxone + Flagyl for intraabdominal infection, abscess Cx - multiple organisms, none predominant, afebrile, WBC down 9.9  TPN Access: double lumen PICC placed 9/18 TPN start date: 9/18 Nutritional Goals (per RD recommendation on 9/18): Kcal:  1700-1900 Protein:  85-100 grams Fluid:  > 1.7 L  Goal concentrated TPN rate is 55 ml/hr (provides 90 g of protein, 265 g of dextrose, and 60  g of lipids which provides 1876 kCals per day)  Current Nutrition:  CLD -> FLD -> soft diet Boost Breeze tid (each provides 250 kcal, 9 g protein) - 3 charted as given 9/20 TPN - 1/2 rate  Plan:  Decrease concentrated TPN to 30 mL/hr. This TPN provides 49 g of protein, 146 g of dextrose, and 33 g of lipids which provides 1022 kCals per day, meeting 60% of patient needs Electrolytes in TPN: standard lytes, QQ:[id number] 1:1 Add MVI, trace elements to TPN D/C SSI Monitor TPN labs F/U diet toleration and ability to wean off TPN tomorrow   Thank you for involving pharmacy in this patient's care.  Renold Genta, PharmD, BCPS Clinical Pharmacist Clinical phone for 04/11/2019 until 3p is [phone] 04/11/2019 7:06 AM  **Pharmacist phone directory can be found on [email or web address] listed under [place]**

## 2019-04-11 NOTE — Progress Notes (Signed)
PROGRESS NOTE                                                                                                                                                                                                             Patient Demographics:    Kirsten LoserDiane Rivera, is a 68 y.o. female, DOB - 24-Aug-1950, ZOX:[id number]  Admit date - 04/04/2019   Admitting Physician Clydie Braunondell A Smith, MD  Outpatient Primary MD for the patient is Gordy SaversKwiatkowski, Peter F, MD  LOS - 6  Chief Complaint  Patient presents with   Abdominal Pain       Brief Narrative  [name] is a 68 y.o. female with medical history significant of hypertension, hypothyroidism, alcoholism, anxiety, and duodenal ulcer perforation s/p surgical repair; presents with complaints of abdominal pain over the last 3-4 days.  Apparently patient has been confused for several months which had gotten worse in the last 3 to 4 weeks per husband, he did not seek medical help and could not give me a good reason for why he did not seek help.  I discussed with patient's husband her medical situation on 04/06/2019 according to the husband patient has been complaining of abdominal pain for the last few days prior to hospital admission, she was heavily drinking up till about 1 month ago and still drinking a few drinks till 1 week ago.  In the ER she was diagnosed with diverticular abscess, IR and general surgery were consulted and she was admitted.   Subjective:   Patient in bed denies any chest or abdominal pain, no nausea vomiting or shortness of breath.   Assessment  & Plan :     1.  Diverticular abscess.  IR consulted along with general surgery, does have a left lower quadrant JP drain which was placed on 04/05/2019.  She is continued on empiric IV Rocephin, general surgery on board, currently on clear liquids along with IV TNA via PICC line. Abscess cultures inconclusive with multiple organisms but clinical response is good to IV Rocephin which  will be continued.  Per general surgery on 04/11/2019 diet can be advanced to soft, okay to switch to oral antibiotics on 04/12/2019.  We will also await IR input.  2.  Alcohol abuse.  Ongoing.  No signs of DTs, CIWA protocol.  Counseled to quit.  3.  Acute metabolic encephalopathy on top of chronic encephalopathy with previous history of CVA.  Seems to have this problem for several months, no help was sought  by husband.  Stable B12, TSH, RPR and folic acid levels, nonacute CT head and MRI brain showing old left frontal CVA.  EEG unremarkable.  For now IV Thiamine x 5 days as this could be Wernicke's encephalopathy with her longstanding history of alcohol abuse.  4.  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  5.  History of malabsorption. Stable anemia panel along with O24, folic acid levels pending.    Family Communication  :  Husband 9/16, again bedside on 9/18, 9/19, 9/20  Code Status :  Full  Disposition Plan  :  TBD  Consults  :  CCS, IR  Procedures  :    MRI  EEG  CT -  1. Sigmoid diverticulitis with a large diverticular abscess. There is loss of fat plane between the superior uterus and diverticular abscess. 2. A 15 mm high attenuating structure in the anterior upper abdomen appears similar to prior CT and may represent the duodenal bulb. Aortic Atherosclerosis.   DVT Prophylaxis  :  Lovenox   Lab Results  Component Value Date   PLT 387 04/11/2019    Diet :  Diet Order            DIET SOFT Room service appropriate? Yes; Fluid consistency: Thin  Diet effective now               Inpatient Medications Scheduled Meds:  amLODipine  10 mg Oral Daily   aspirin  81 mg Oral Daily   Chlorhexidine Gluconate Cloth  6 each Topical Daily   docusate sodium  200 mg Oral BID   enoxaparin (LOVENOX) injection  40 mg Subcutaneous Q24H   feeding supplement  1 Container Oral TID BM   folic acid  1 mg  Oral Daily   multivitamin with minerals  1 tablet Oral Daily   pantoprazole  40 mg Oral BID   sodium chloride flush  5 mL Intracatheter Q8H   [START ON 04/13/2019] thiamine  100 mg Oral Daily   tolvaptan  15 mg Oral Q24H   Continuous Infusions:  cefTRIAXone (ROCEPHIN)  IV 2 g (04/10/19 1424)   metronidazole 500 mg (04/11/19 1045)   TPN ADULT (ION) 55 mL/hr at 04/11/19 0700   TPN ADULT (ION)     PRN Meds:.acetaminophen **OR** acetaminophen, haloperidol lactate, hydrALAZINE, ondansetron **OR** ondansetron (ZOFRAN) IV, sodium chloride flush  Antibiotics  :   Anti-infectives (From admission, onward)   Start     Dose/Rate Route Frequency Ordered Stop   04/06/19 1400  cefTRIAXone (ROCEPHIN) 2 g in sodium chloride 0.9 % 100 mL IVPB     2 g 200 mL/hr over 30 Minutes Intravenous Every 24 hours 04/05/19 1729     04/06/19 0000  metroNIDAZOLE (FLAGYL) IVPB 500 mg     500 mg 100 mL/hr over 60 Minutes Intravenous Every 8 hours 04/05/19 1729     04/05/19 1500  cefTRIAXone (ROCEPHIN) 2 g in sodium chloride 0.9 % 100 mL IVPB     2 g 200 mL/hr over 30 Minutes Intravenous  Once 04/05/19 1450 04/05/19 1547   04/05/19 1445  ceFEPIme (MAXIPIME) 2 g in sodium chloride 0.9 % 100 mL IVPB  Status:  Discontinued     2 g 200 mL/hr over 30 Minutes Intravenous Every 12 hours 04/05/19 1444 04/05/19 1450   04/05/19 1445  metroNIDAZOLE (FLAGYL) IVPB 500 mg     500 mg 100 mL/hr over 60 Minutes Intravenous  Once  04/05/19 1444 04/05/19 1659   04/05/19 0945  cephALEXin (KEFLEX) capsule 500 mg     500 mg Oral  Once 04/05/19 0937 04/05/19 0950          Objective:   Vitals:   04/10/19 1501 04/10/19 2246 04/11/19 0917 04/11/19 1100  BP: 127/85 (!) 152/88 (!) 153/69   Pulse: 71 87    Resp: 16 18    Temp: 98.4 F (36.9 C) 98.3 F (36.8 C)  98.1 F (36.7 C)  TempSrc: Oral Oral    SpO2: 97% 98%    Weight:      Height:        Wt Readings from Last 3 Encounters:  04/06/19 42.7 kg  01/27/18 42.7  kg  10/20/17 41.3 kg     Intake/Output Summary (Last 24 hours) at 04/11/2019 1144 Last data filed at 04/11/2019 0815 Gross per 24 hour  Intake 2367.25 ml  Output 80 ml  Net 2287.25 ml     Physical Exam  In bed, comfortable still remains slightly confused but no focal deficits, JP drain in place in the left lower quadrant [place].AT,PERRAL Supple Neck,No JVD, No cervical lymphadenopathy appriciated.  Symmetrical Chest wall movement, Good air movement bilaterally, CTAB RRR,No Gallops, Rubs or new Murmurs, No Parasternal Heave +ve B.Sounds, Abd Soft, No tenderness, No organomegaly appriciated, No rebound - guarding or rigidity. No Cyanosis, Clubbing or edema, No new Rash or bruise    Data Review:    CBC Recent Labs  Lab 04/07/19 0212 04/07/19 1139 04/08/19 0219 04/09/19 0452 04/10/19 0423 04/11/19 0428  WBC 9.6  --  8.1 11.9* 9.9 8.2  HGB 13.3  --  14.0 13.1 13.0 12.0  HCT 37.6 38.3 37.8 36.1 36.9 34.2*  PLT 406*  --  379 375 391 387  MCV 93.3  --  92.2 92.8 94.6 96.1  MCH 33.0  --  34.1* 33.7 33.3 33.7  MCHC 35.4  --  37.0* 36.3* 35.2 35.1  RDW 12.1  --  12.3 12.3 12.9 13.0  LYMPHSABS 1.2  --  1.5 1.0 1.1 1.2  MONOABS 1.1*  --  0.9 0.9 0.9 0.9  EOSABS 0.0  --  0.0 0.0 0.0 0.1  BASOSABS 0.1  --  0.1 0.0 0.0 0.0    Chemistries  Recent Labs  Lab 04/07/19 0212 04/08/19 0219 04/09/19 0452 04/10/19 0423 04/11/19 0428  NA 129* 127* 125* 130* 130*  K 3.5 3.4* 3.2* 3.9 3.7  CL 96* 92* 90* 99 96*  CO2 23 23 25 25 26   GLUCOSE 139* 102* 137* 130* 122*  BUN 5* <5* <5* 6* 9  CREATININE 0.53 0.48 0.41* 0.38* 0.35*  CALCIUM 7.8* 8.1* 8.1* 7.9* 8.0*  MG 1.8 1.8 1.8 2.3 2.1  AST 18 16 21 26  37  ALT 13 14 15 16 22   ALKPHOS 84 75 68 69 64  BILITOT 0.4 0.5 0.3 0.2* <0.1*   ------------------------------------------------------------------------------------------------------------------ Recent Labs    04/09/19 0452 04/11/19 0428  TRIG 45 55    Lab Results  Component  Value Date   HGBA1C 5.8 (H) 04/06/2019   ------------------------------------------------------------------------------------------------------------------ No results for input(s): TSH, T4TOTAL, T3FREE, THYROIDAB in the last 72 hours.  Invalid input(s): FREET3 ------------------------------------------------------------------------------------------------------------------ No results for input(s): VITAMINB12, FOLATE, FERRITIN, TIBC, IRON, RETICCTPCT in the last 72 hours.  Coagulation profile Recent Labs  Lab 04/06/19 0404  INR 1.2    No results for input(s): DDIMER in the last 72 hours.  Cardiac Enzymes No results for input(s): CKMB, TROPONINI, MYOGLOBIN  in the last 168 hours.  Invalid input(s): CK ------------------------------------------------------------------------------------------------------------------ No results found for: BNP  Micro Results Recent Results (from the past 240 hour(s))  Urine culture     Status: Abnormal   Collection Time: 04/05/19  8:45 AM   Specimen: Urine, Random  Result Value Ref Range Status   Specimen Description URINE, RANDOM  Final   Special Requests NONE  Final   Culture (A)  Final    <10,000 COLONIES/mL INSIGNIFICANT GROWTH Performed at [facility], [address] [place], [place] [address]    Report Status 04/06/2019 FINAL  Final  SARS Coronavirus 2 [facility] order, Performed in [facility] hospital lab) Nasopharyngeal Nasopharyngeal Swab     Status: None   Collection Time: 04/05/19  2:45 PM   Specimen: Nasopharyngeal Swab  Result Value Ref Range Status   SARS Coronavirus 2 NEGATIVE NEGATIVE Final    Comment: (NOTE) If result is NEGATIVE SARS-CoV-2 target nucleic acids are NOT DETECTED. The SARS-CoV-2 RNA is generally detectable in upper and lower  respiratory specimens during the acute phase of infection. The lowest  concentration of SARS-CoV-2 viral copies this assay can detect is 250  copies / mL. A negative result does  not preclude SARS-CoV-2 infection  and should not be used as the sole basis for treatment or other  patient management decisions.  A negative result may occur with  improper specimen collection / handling, submission of specimen other  than nasopharyngeal swab, presence of viral mutation(s) within the  areas targeted by this assay, and inadequate number of viral copies  (<250 copies / mL). A negative result must be combined with clinical  observations, patient history, and epidemiological information. If result is POSITIVE SARS-CoV-2 target nucleic acids are DETECTED. The SARS-CoV-2 RNA is generally detectable in upper and lower  respiratory specimens dur ing the acute phase of infection.  Positive  results are indicative of active infection with SARS-CoV-2.  Clinical  correlation with patient history and other diagnostic information is  necessary to determine patient infection status.  Positive results do  not rule out bacterial infection or co-infection with other viruses. If result is PRESUMPTIVE POSTIVE SARS-CoV-2 nucleic acids MAY BE PRESENT.   A presumptive positive result was obtained on the submitted specimen  and confirmed on repeat testing.  While 2019 novel coronavirus  (SARS-CoV-2) nucleic acids may be present in the submitted sample  additional confirmatory testing may be necessary for epidemiological  and / or clinical management purposes  to differentiate between  SARS-CoV-2 and other Sarbecovirus currently known to infect humans.  If clinically indicated additional testing with an alternate test  methodology [phone]) is advised. The SARS-CoV-2 RNA is generally  detectable in upper and lower respiratory sp ecimens during the acute  phase of infection. The expected result is Negative. Fact Sheet for Patients:  BoilerBrush.com.cy Fact Sheet for Healthcare Providers: https://pope.com/ This test is not yet approved or  cleared by the [place] FDA and has been authorized for detection and/or diagnosis of SARS-CoV-2 by FDA under an Emergency Use Authorization (EUA).  This EUA will remain in effect (meaning this test can be used) for the duration of the COVID-19 declaration under Section 564(b)(1) of the Act, 21 U.S.C. section 360bbb-3(b)(1), unless the authorization is terminated or revoked sooner. Performed at [facility], [address] [place], [place] [address]   Aerobic/Anaerobic Culture (surgical/deep wound)     Status: Abnormal   Collection Time: 04/05/19  6:49 PM   Specimen: Abscess  Result Value Ref Range Status   Specimen Description ABSCESS  Final   Special Requests Normal  Final   Gram Stain   Final    FEW WBC PRESENT, PREDOMINANTLY PMN MODERATE GRAM POSITIVE COCCI Performed at [facility], [address], [place], [place] [address]    Culture (A)  Final    MULTIPLE ORGANISMS PRESENT, NONE PREDOMINANT MIXED ANAEROBIC FLORA PRESENT.  CALL LAB IF FURTHER IID REQUIRED.    Report Status 04/09/2019 FINAL  Final    Radiology Reports Ct Head Wo Contrast  Result Date: 04/06/2019 CLINICAL DATA:  Altered level of consciousness today. EXAM: CT HEAD WITHOUT CONTRAST TECHNIQUE: Contiguous axial images were obtained from the base of the skull through the vertex without intravenous contrast. COMPARISON:  Head CT scan 06/04/2014. FINDINGS: Brain: No evidence of acute infarction, hemorrhage, hydrocephalus, extra-axial collection or mass lesion/mass effect. Chronic microvascular ischemic change has markedly worsened since the prior head CT. Left frontal infarct is remote but new since the prior CT. Vascular: No hyperdense vessel or unexpected calcification. Skull: Intact.  No focal lesion. Sinuses/Orbits: Negative. Other: None. IMPRESSION: No acute abnormality. Extensive chronic microvascular ischemic change. Remote left frontal infarct noted. Electronically Signed   By: Drusilla Kanner  M.D.   On: 04/06/2019 13:34   Mr Brain Wo Contrast  Result Date: 04/07/2019 CLINICAL DATA:  Encephalopathy. EXAM: MRI HEAD WITHOUT CONTRAST TECHNIQUE: Multiplanar, multiecho pulse sequences of the brain and surrounding structures were obtained without intravenous contrast. COMPARISON:  Head CT 04/06/2019, brain MRI 02/05/2009 FINDINGS: Brain: Several sequences are motion degraded. There is no evidence of acute infarct. No evidence of intracranial mass. No midline shift or extra-axial fluid collection. Foci of encephalomalacia and chronic hemosiderin deposition within the left frontal lobe at sites of previous hemorrhagic contusions. Advanced scattered T2/FLAIR hyperintensity within the cerebral white matter has progressed since prior MRI 02/05/2009. However, the previously described large right frontal parietal white matter lesion has not significantly changed. A chronic lacunar infarct within the right thalamus is also new from this prior MRI. Mild generalized parenchymal atrophy. Vascular: Flow voids maintained within the proximal large arterial vessels. Skull and upper cervical spine: Normal marrow signal. Incompletely assessed cervical spondylosis with C4-C5 posterior disc osteophyte complex. Sinuses/Orbits: Imaged globes and orbits demonstrate no acute abnormality. Mild ethmoid sinus mucosal thickening. Trace fluid within right mastoid air cells IMPRESSION: Motion degraded examination. No evidence of acute intracranial abnormality, including acute infarct. Foci of encephalomalacia within the left frontal lobe, sequela of remote hemorrhagic contusions. Advanced signal changes within the white matter have significantly progressed since remote MRI 02/05/2009. Findings are nonspecific but may reflect advanced chronic small vessel ischemic disease. Alternative etiologies such as a demyelinating process cannot be excluded. Chronic right thalamic lacunar infarct, new from prior exam. Electronically Signed   By:  Jackey Loge   On: 04/07/2019 14:30   Ct Abdomen Pelvis W Contrast  Result Date: 04/05/2019 CLINICAL DATA:  68 year old female with abdominal pain. History of prior perforated duodenal ulcer and exclude Terri laparotomy and pyloroplasty. EXAM: CT ABDOMEN AND PELVIS WITH CONTRAST TECHNIQUE: Multidetector CT imaging of the abdomen and pelvis was performed using the standard protocol following bolus administration of intravenous contrast. CONTRAST:  OMNIPAQUE IOHEXOL 300 MG/ML  SOLN COMPARISON:  CT of the abdomen pelvis dated 08/05/2011. FINDINGS: Evaluation of this exam is limited due to respiratory motion artifact. Lower chest: The visualized lung bases are clear. Minimal right middle lobe atelectasis/scarring. There is mild cardiomegaly. Coronary vascular calcifications noted.  No definite intra-abdominal free air or free fluid. Hepatobiliary: A 1 cm hypodense lesion in the dome of the liver is not well characterized but appears similar to prior CT, likely a cyst. The liver is otherwise unremarkable. There is mild intrahepatic biliary ductal dilatation. No calcified gallstone or pericholecystic fluid. Pancreas: Unremarkable. No pancreatic ductal dilatation or surrounding inflammatory changes. Spleen: Normal in size without focal abnormality. Adrenals/Urinary Tract: The adrenal glands are unremarkable. There is no hydronephrosis on either side. There is symmetric enhancement and excretion of contrast by both kidneys. Subcentimeter left renal hypodense focus is too small to characterize but likely represents a cyst. The urinary bladder is unremarkable. Stomach/Bowel: Extensive sigmoid diverticulosis with active inflammatory changes. There is a 9.7 x 3.7 x 3.8 cm diverticular abscess along the sigmoid colon. Postsurgical changes of gastrojejunostomy. No evidence of bowel obstruction. A fluid containing structure with pockets of air in the upper abdomen anteriorly (series 3, image 27) appears similar to the CT  of 2013 and may represent the duodenal bulb. A 15 mm high attenuating structure within this may represent a chronic stone although an enhancing lesion is not entirely excluded. Vascular/Lymphatic: Moderate aortoiliac atherosclerotic disease. The IVC is unremarkable. No portal venous gas. There is no adenopathy. Reproductive: The uterus is grossly unremarkable. There is loss of fat plane between the superior uterus and diverticular abscess of the sigmoid colon. No adnexal masses. Other: None Musculoskeletal: Osteopenia. No acute osseous pathology. IMPRESSION: 1. Sigmoid diverticulitis with a large diverticular abscess. There is loss of fat plane between the superior uterus and diverticular abscess. 2. A 15 mm high attenuating structure in the anterior upper abdomen appears similar to prior CT and may represent the duodenal bulb. Aortic Atherosclerosis (ICD10-I70.0). Electronically Signed   By: Elgie Collard M.D.   On: 04/05/2019 14:14   Ct Image Guided Drainage By Percutaneous Catheter  Result Date: 04/06/2019 INDICATION: 68 year old female with sigmoid colonic diverticulitis and large intramural abscess. She presents for CT-guided drain placement. EXAM: CT-guided drain placement MEDICATIONS: The patient is currently admitted to the hospital and receiving intravenous antibiotics. The antibiotics were administered within an appropriate time frame prior to the initiation of the procedure. ANESTHESIA/SEDATION: Fentanyl 100 mcg IV; Versed 2 mg IV Moderate Sedation Time:  17 minutes The patient was continuously monitored during the procedure by the interventional radiology nurse under my direct supervision. COMPLICATIONS: None immediate. PROCEDURE: Informed written consent was obtained from the patient after a thorough discussion of the procedural risks, benefits and alternatives. All questions were addressed. Maximal Sterile Barrier Technique was utilized including caps, mask, sterile gowns, sterile gloves,  sterile drape, hand hygiene and skin antiseptic. A timeout was performed prior to the initiation of the procedure. A planning axial CT scan was performed. The fluid and gas collection in the left lower quadrant of the anatomic pelvis position between the sigmoid colon, uterus and bladder was successfully identified. A suitable skin entry site was selected and marked. Care was taken to ensure that the entry site was below the redundant portion of the sigmoid colon. After sterile prep and drape in the standard fashion with chlorhexidine, local anesthesia was attained by infiltration with 1% lidocaine. A small dermatotomy was made. Under intermittent CT guidance, an 18 gauge trocar needle was advanced into the fluid and gas collection. A 0.035 wire was then advanced into the fluid and gas collection. The needle was exchanged for a fascial dilator and the soft tissue tract was dilated to 12 [place]. A Cook 12 [place] all-purpose  drainage catheter modified with additional sideholes was then advanced over the wire and formed. Aspiration yields approximately 80 cc of thick purulent material. A sample was sent for Gram stain and culture. The abscess cavity was lavaged with sterile saline and the drain connected to JP bulb suction. The drain was then secured to the skin with 0 Prolene suture and an adhesive fixation device. The patient tolerated the procedure well. IMPRESSION: Successful placement of a 12 French drainage catheter modified with additional sideholes. Aspiration yields 80 mL of frankly purulent and foul-smelling fluid. Samples were sent for Gram stain and culture. PLAN: Maintain drain to JP bulb suction. Flush drainage catheter at least once per shift. When drain output has decreased significantly, recommend follow-up with both contrast-enhanced CT scan of the pelvis to assess for resolution of the abscess as well as drain injection to assess for fistula with the sigmoid colon given the intramural location of the  abscess. Signed, Sterling Big, MD, RPVI [facility] Electronically Signed   By: Malachy Moan M.D.   On: 04/06/2019 12:42   [place] Ekg Site Rite  Result Date: 04/08/2019 If Site Rite image not attached, placement could not be confirmed due to current cardiac rhythm.   Time Spent in minutes  30   Susa Raring M.D on 04/11/2019 at 11:44 AM  To page go to [email or web address] - password [email or web address]

## 2019-04-11 NOTE — Progress Notes (Signed)
    Referring Physician(s): Dr. Lucia Gaskins  Supervising Physician: Markus Daft  Patient Status:  [facility] - In-pt  Chief Complaint: Diverticular abscess  Subjective: Stable.  Resting in bed. Denies complaints.    Allergies: Sulfa antibiotics, Sulfonamide derivatives, Penicillins, and Hydromorphone hcl  Medications: Prior to Admission medications   Medication Sig Start Date End Date Taking? Authorizing Provider  acetaminophen (TYLENOL) 500 MG tablet Take 500 mg by mouth every 6 (six) hours as needed for mild pain.    Yes [provider]     Vital Signs: BP 112/63 (BP Location: Left Arm)   Pulse 69   Temp 98.1 F (36.7 C) (Oral)   Resp 14   Ht 5\' 3"  (1.6 m)   Wt 94 lb 2.2 oz (42.7 kg)   SpO2 100%   BMI 16.68 kg/m   Physical Exam NAD, alert Abdomen: soft, non-tender, non-distended.  Insertion site with some erythema but no drainage.  Bulb with significant amount of feculent-appearing output. 20 mL recorded overnight.   Imaging: [place] Ekg Site Rite  Result Date: 04/08/2019 If Site Rite image not attached, placement could not be confirmed due to current cardiac rhythm.   Labs:  CBC: Recent Labs    04/08/19 0219 04/09/19 0452 04/10/19 0423 04/11/19 0428  WBC 8.1 11.9* 9.9 8.2  HGB 14.0 13.1 13.0 12.0  HCT 37.8 36.1 36.9 34.2*  PLT 379 375 391 387    COAGS: Recent Labs    04/06/19 0404  INR 1.2  APTT 40*    BMP: Recent Labs    04/08/19 0219 04/09/19 0452 04/10/19 0423 04/11/19 0428  NA 127* 125* 130* 130*  K 3.4* 3.2* 3.9 3.7  CL 92* 90* 99 96*  CO2 23 25 25 26   GLUCOSE 102* 137* 130* 122*  BUN <5* <5* 6* 9  CALCIUM 8.1* 8.1* 7.9* 8.0*  CREATININE 0.48 0.41* 0.38* 0.35*  GFRNONAA >60 >60 >60 >60  GFRAA >60 >60 >60 >60    LIVER FUNCTION TESTS: Recent Labs    04/08/19 0219 04/09/19 0452 04/10/19 0423 04/11/19 0428  BILITOT 0.5 0.3 0.2* <0.1*  AST 16 21 26  37  ALT 14 15 16 22   ALKPHOS 75 68 69 64  PROT 5.8* 5.5* 5.9* 5.2*  ALBUMIN  2.9* 2.6* 2.6* 2.6*    Assessment and Plan: Diverticular abscess s/p drain placement 9/15 by Dr. Laurence Ferrari Patient now with feculent-appearing output from her drain. Insertion site intact but with erythema. No drainage.  Monitor.  Making slow improvement clinically. Mental status stable compared to the end of last week.  Discharge per primary team(s).   Discussed with Dr. Anselm Pancoast who recommends repeat CT Abdomen Pelvis prior to discharge. This has been ordered. Discharge orders to be placed once imaging reviewed.  Electronically Signed: Docia Barrier, PA 04/11/2019, 4:48 PM   I spent a total of 15 Minutes at the the patient's bedside AND on the patient's hospital floor or unit, greater than 50% of which was counseling/coordinating care for intra-abdominal fluid collection.

## 2019-04-11 NOTE — Care Management Important Message (Signed)
Important Message  Patient Details  Name: [name] MRN: [id number] Date of Birth: 18-Jun-1951   Medicare Important Message Given:  Yes     Shelda Altes 04/11/2019, 2:49 PM

## 2019-04-12 ENCOUNTER — Inpatient Hospital Stay (HOSPITAL_COMMUNITY): Payer: PPO

## 2019-04-12 ENCOUNTER — Encounter (HOSPITAL_COMMUNITY): Payer: Self-pay | Admitting: Interventional Radiology

## 2019-04-12 HISTORY — PX: IR SINUS/FIST TUBE CHK-NON GI: IMG673

## 2019-04-12 LAB — BASIC METABOLIC PANEL
Anion gap: 7 (ref 5–15)
BUN: 10 mg/dL (ref 8–23)
CO2: 26 mmol/L (ref 22–32)
Calcium: 8.4 mg/dL — ABNORMAL LOW (ref 8.9–10.3)
Chloride: 101 mmol/L (ref 98–111)
Creatinine, Ser: 0.51 mg/dL (ref 0.44–1.00)
GFR calc Af Amer: >60 mL/min (ref >60)
GFR calc non Af Amer: >60 mL/min (ref >60)
Glucose, Bld: 99 mg/dL (ref 70–99)
Potassium: 4.1 mmol/L (ref 3.5–5.1)
Sodium: 134 mmol/L — ABNORMAL LOW (ref 135–145)

## 2019-04-12 MED ORDER — SACCHAROMYCES BOULARDII 250 MG PO CAPS
250.0000 mg | ORAL_CAPSULE | Freq: Two times a day (BID) | ORAL | Status: DC
  Administered 2019-04-12 – 2019-04-14 (×5): 250 mg via ORAL
  Filled 2019-04-12 (×5): qty 1

## 2019-04-12 MED ORDER — ENSURE ENLIVE PO LIQD
237.0000 mL | Freq: Three times a day (TID) | ORAL | Status: DC
  Administered 2019-04-12 – 2019-04-14 (×7): 237 mL via ORAL

## 2019-04-12 MED ORDER — IOHEXOL 300 MG/ML  SOLN
80.0000 mL | Freq: Once | INTRAMUSCULAR | Status: AC | PRN
  Administered 2019-04-12: 09:00:00 80 mL via INTRAVENOUS

## 2019-04-12 MED ORDER — IOHEXOL 300 MG/ML  SOLN
50.0000 mL | Freq: Once | INTRAMUSCULAR | Status: AC | PRN
  Administered 2019-04-12: 10 mL

## 2019-04-12 NOTE — Progress Notes (Signed)
Referring Physician(s): Newman,D  Supervising Physician: Simonne Come  Patient Status:  [facility] - In-pt  Chief Complaint:  Abdominal pain/abscess  Subjective: Pt doing ok today; denies worsening abd pain,N/V; husband in room; just returned from CT   Allergies: Sulfa antibiotics, Sulfonamide derivatives, Penicillins, and Hydromorphone hcl  Medications: Prior to Admission medications   Medication Sig Start Date End Date Taking? Authorizing Provider  acetaminophen (TYLENOL) 500 MG tablet Take 500 mg by mouth every 6 (six) hours as needed for mild pain.    Yes [provider]     Vital Signs: BP (!) 144/76 (BP Location: Left Arm)    Pulse 65    Temp (!) 97.5 F (36.4 C) (Oral)    Resp 18    Ht 5\' 3"  (1.6 m)    Wt 94 lb 2.2 oz (42.7 kg)    SpO2 98%    BMI 16.68 kg/m   Physical Exam; awake, conversant; LLQ drain intact, small amount brown feculent fluid in JP bulb; insertion site ok, minimal tenderness  Imaging: Ct Abdomen Pelvis W Contrast  Result Date: 04/12/2019 CLINICAL DATA:  Status post percutaneous catheter drainage of large sigmoid diverticular abscess on 04/05/2019. EXAM: CT ABDOMEN AND PELVIS WITH CONTRAST TECHNIQUE: Multidetector CT imaging of the abdomen and pelvis was performed using the standard protocol following bolus administration of intravenous contrast. CONTRAST:  62mL OMNIPAQUE IOHEXOL 300 MG/ML  SOLN COMPARISON:  04/05/2019 FINDINGS: Lower chest: No acute abnormality. Hepatobiliary: No focal liver abnormality is seen. Status post cholecystectomy. No biliary dilatation. Pancreas: Unremarkable. No pancreatic ductal dilatation or surrounding inflammatory changes. Spleen: Normal in size without focal abnormality. Adrenals/Urinary Tract: Adrenal glands are unremarkable. Kidneys are normal, without renal calculi, focal lesion, or hydronephrosis. Bladder is unremarkable. Stomach/Bowel: Status post prior bowel resection. Probable component of mild colonic ileus.  No small bowel dilatation. No free intraperitoneal air is identified. Vascular/Lymphatic: No significant vascular findings are present. No enlarged abdominal or pelvic lymph nodes. Reproductive: Uterus and bilateral adnexa are unremarkable. Other: At the level of the left lower quadrant percutaneous drainage catheter, there is no further liquefied abscess identified by CT. The abscess cavity is completely decompressed. The drainage catheter lies along the inferior aspect of the distal sigmoid colon with numerous diverticula again visualized. Musculoskeletal: No acute or significant osseous findings. IMPRESSION: 1. Complete evacuation of sigmoid diverticular abscess after percutaneous catheter drainage. The left lower quadrant drainage catheter lies along the inferior aspect of the distal sigmoid colon. 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 2. Probable component of mild colonic ileus. No small bowel obstruction or evidence of bowel perforation. Electronically Signed   By: 04/07/2019 M.D.   On: 04/12/2019 09:59    Labs:  CBC: Recent Labs    04/08/19 0219 04/09/19 0452 04/10/19 0423 04/11/19 0428  WBC 8.1 11.9* 9.9 8.2  HGB 14.0 13.1 13.0 12.0  HCT 37.8 36.1 36.9 34.2*  PLT 379 375 391 387    COAGS: Recent Labs    04/06/19 0404  INR 1.2  APTT 40*    BMP: Recent Labs    04/09/19 0452 04/10/19 0423 04/11/19 0428 04/12/19 0359  NA 125* 130* 130* 134*  K 3.2* 3.9 3.7 4.1  CL 90* 99 96* 101  CO2 25 25 26 26   GLUCOSE 137* 130* 122* 99  BUN <5* 6* 9 10  CALCIUM 8.1* 7.9* 8.0* 8.4*  CREATININE 0.41* 0.38*  0.35* 0.51  GFRNONAA >60 >60 >60 >60  GFRAA >60 >60 >60 >60    LIVER FUNCTION TESTS: Recent Labs    04/08/19 0219 04/09/19 0452 04/10/19 0423 04/11/19 0428  BILITOT 0.5 0.3 0.2* <0.1*  AST 16 21 26  37  ALT 14 15 16 22   ALKPHOS 75 68 69 64    PROT 5.8* 5.5* 5.9* 5.2*  ALBUMIN 2.9* 2.6* 2.6* 2.6*    Assessment and Plan: Pt with hx diverticular abscess, s/p drainage 9/15; afebrile; creat nl; drain fluid cx- mult org; f/u CT today revealed: 1. Complete evacuation of sigmoid diverticular abscess after percutaneous catheter drainage. The left lower quadrant drainage catheter lies along the inferior aspect of the distal sigmoid colon. 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 2. Probable component of mild colonic ileus. No small bowel obstruction or evidence of bowel perforation  Plan is for drain injection in IR later today to r/o bowel fistula   Electronically Signed: D. Rowe Robert, PA-C 04/12/2019, 11:27 AM   I spent a total of 15 minutes at the the patient's bedside AND on the patient's hospital floor or unit, greater than 50% of which was counseling/coordinating care for left abdominal abscess drain    Patient ID: [name], female   DOB: 1951/04/22, 68 y.o.   MRN: [id number]

## 2019-04-12 NOTE — Procedures (Signed)
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  EBL: None Complications: None immediate  PLAN: - Drain converted from JP bulb to gravity bag. - Do NOT flush drain, but maintain records regarding daily drain out put. - Once d/c'd, appt at IR drain clinic in 2 week for E&M and drain injection (No CT, unless recurrent pain or fever).  Ronny Bacon, MD Pager #: [phone]

## 2019-04-12 NOTE — Progress Notes (Signed)
PROGRESS NOTE                                                                                                                                                                                                             Patient Demographics:    [name], is a 68 y.o. female, DOB - February 20, 1951, ZOX:[id number]  Admit date - 04/04/2019   Admitting Physician Clydie Braun, MD  Outpatient Primary MD for the patient is Gordy Savers, MD  LOS - 7  Chief Complaint  Patient presents with   Abdominal Pain       Brief Narrative  [name] is a 68 y.o. female with medical history significant of hypertension, hypothyroidism, alcoholism, anxiety, and duodenal ulcer perforation s/p surgical repair; presents with complaints of abdominal pain over the last 3-4 days.  Apparently patient has been confused for several months which had gotten worse in the last 3 to 4 weeks per husband, he did not seek medical help and could not give me a good reason for why he did not seek help.  I discussed with patient's husband her medical situation on 04/06/2019 according to the husband patient has been complaining of abdominal pain for the last few days prior to hospital admission, she was heavily drinking up till about 1 month ago and still drinking a few drinks till 1 week ago.  In the ER she was diagnosed with diverticular abscess, IR and general surgery were consulted and she was admitted.   Subjective:   Patient in bed denies any chest or abdominal pain, no nausea vomiting or shortness of breath.   Assessment  & Plan :     1.  Diverticular abscess.  IR consulted along with general surgery, does have a left lower quadrant JP drain which was placed on 04/05/2019.  She was given bowel rest, IV antibiotics being continued, currently nutrition through TNA.  General surgery has advance diet to soft on 04/11/2019 patient tolerating it well.  If tolerates for another 24 hours stop TNA and discontinue  PICC line.  Repeat CT scan done on 04/12/2019 shows complete resolution of abscess, radiology has recommended catheter injection with contrast under fluoroscopy is recommended to evaluate for persistent fistula to the sigmoid colonic lumen.  Will wait for this procedure to be done.  If procedure is done and she tolerates it well transition to oral antibiotics another 7 to 10 days, advance diet, and prepare for discharge home.  Likely in the next 1  to 2 days   2.  Alcohol abuse.  Ongoing.  No signs of DTs, CIWA protocol.  Counseled to quit.  3.  Acute metabolic encephalopathy on top of chronic encephalopathy with previous history of CVA.  Seems to have this problem for several months, no help was sought by husband.  Stable B12, TSH, RPR and folic acid levels, nonacute CT head and MRI brain showing old left frontal CVA.  EEG unremarkable.  She was treated with high-dose IV thiamine for 5 days and now has been switched to oral thiamine, limited response to IV thiamine suggest this possibly was not Wernicke's encephalopathy and more like longstanding brain damage from severe alcohol abuse.  4.  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  5.  History of malabsorption. Stable anemia panel along with B12 and folic acid levels    Family Communication  :  Husband 9/16, again bedside on 9/18, 9/19, 9/20, 9/21, 9/22  Code Status :  Full  Disposition Plan  : Home in 1 to 2 days once radiology has finished there fluoroscopy guided catheter injection of the colon.  Consults  :  CCS, IR  Procedures  :    MRI - Non acute  EEG - no seizures   CT -  1. Sigmoid diverticulitis with a large diverticular abscess. There is loss of fat plane between the superior uterus and diverticular abscess. 2. A 15 mm high attenuating structure in the anterior upper abdomen appears similar to prior CT and may represent the duodenal bulb. Aortic  Atherosclerosis.  Repeat CT scan abdomen pelvis 04/12/2019.  Complete evacuation of abscess.   DVT Prophylaxis  :  Lovenox   Lab Results  Component Value Date   PLT 387 04/11/2019    Diet :  Diet Order            DIET SOFT Room service appropriate? Yes; Fluid consistency: Thin  Diet effective now               Inpatient Medications Scheduled Meds:  amLODipine  10 mg Oral Daily   aspirin  81 mg Oral Daily   Chlorhexidine Gluconate Cloth  6 each Topical Daily   docusate sodium  200 mg Oral BID   enoxaparin (LOVENOX) injection  40 mg Subcutaneous Q24H   feeding supplement  1 Container Oral TID BM   folic acid  1 mg Oral Daily   multivitamin with minerals  1 tablet Oral Daily   pantoprazole  40 mg Oral BID   saccharomyces boulardii  250 mg Oral BID   sodium chloride flush  5 mL Intracatheter Q8H   [START ON 04/13/2019] thiamine  100 mg Oral Daily   Continuous Infusions:  cefTRIAXone (ROCEPHIN)  IV 2 g (04/11/19 1426)   metronidazole 500 mg (04/12/19 0829)   TPN ADULT (ION) 30 mL/hr at 04/11/19 1737   PRN Meds:.acetaminophen **OR** acetaminophen, haloperidol lactate, hydrALAZINE, ondansetron **OR** ondansetron (ZOFRAN) IV, sodium chloride flush  Antibiotics  :   Anti-infectives (From admission, onward)   Start     Dose/Rate Route Frequency Ordered Stop   04/06/19 1400  cefTRIAXone (ROCEPHIN) 2 g in sodium chloride 0.9 % 100 mL IVPB     2 g 200 mL/hr over 30 Minutes Intravenous Every 24 hours 04/05/19 1729     04/06/19 0000  metroNIDAZOLE (FLAGYL) IVPB 500 mg     500 mg 100 mL/hr over 60 Minutes Intravenous Every 8 hours 04/05/19  1729     04/05/19 1500  cefTRIAXone (ROCEPHIN) 2 g in sodium chloride 0.9 % 100 mL IVPB     2 g 200 mL/hr over 30 Minutes Intravenous  Once 04/05/19 1450 04/05/19 1547   04/05/19 1445  ceFEPIme (MAXIPIME) 2 g in sodium chloride 0.9 % 100 mL IVPB  Status:  Discontinued     2 g 200 mL/hr over 30 Minutes Intravenous Every 12  hours 04/05/19 1444 04/05/19 1450   04/05/19 1445  metroNIDAZOLE (FLAGYL) IVPB 500 mg     500 mg 100 mL/hr over 60 Minutes Intravenous  Once 04/05/19 1444 04/05/19 1659   04/05/19 0945  cephALEXin (KEFLEX) capsule 500 mg     500 mg Oral  Once 04/05/19 0937 04/05/19 0950          Objective:   Vitals:   04/11/19 1402 04/11/19 1735 04/11/19 2126 04/12/19 0406  BP: 112/63 (!) 116/58 (!) 158/76 (!) 144/76  Pulse: 69  79 65  Resp: 14 16 16 18   Temp: 98.1 F (36.7 C) 98.5 F (36.9 C) 98.3 F (36.8 C) (!) 97.5 F (36.4 C)  TempSrc: Oral Oral Oral Oral  SpO2: 100% 100% 99% 98%  Weight:      Height:        Wt Readings from Last 3 Encounters:  04/06/19 42.7 kg  01/27/18 42.7 kg  10/20/17 41.3 kg     Intake/Output Summary (Last 24 hours) at 04/12/2019 1046 Last data filed at 04/12/2019 0926 Gross per 24 hour  Intake 1161.12 ml  Output 530 ml  Net 631.12 ml     Physical Exam  In chair, comfortable still remains slightly confused but no focal deficits, JP drain in place in the left lower quadrant [place].AT,PERRAL Supple Neck,No JVD, No cervical lymphadenopathy appriciated.  Symmetrical Chest wall movement, Good air movement bilaterally, CTAB RRR,No Gallops, Rubs or new Murmurs, No Parasternal Heave +ve B.Sounds, Abd Soft, No tenderness, No organomegaly appriciated, No rebound - guarding or rigidity. No Cyanosis, Clubbing or edema, No new Rash or bruise     Data Review:    CBC Recent Labs  Lab 04/07/19 0212 04/07/19 1139 04/08/19 0219 04/09/19 0452 04/10/19 0423 04/11/19 0428  WBC 9.6  --  8.1 11.9* 9.9 8.2  HGB 13.3  --  14.0 13.1 13.0 12.0  HCT 37.6 38.3 37.8 36.1 36.9 34.2*  PLT 406*  --  379 375 391 387  MCV 93.3  --  92.2 92.8 94.6 96.1  MCH 33.0  --  34.1* 33.7 33.3 33.7  MCHC 35.4  --  37.0* 36.3* 35.2 35.1  RDW 12.1  --  12.3 12.3 12.9 13.0  LYMPHSABS 1.2  --  1.5 1.0 1.1 1.2  MONOABS 1.1*  --  0.9 0.9 0.9 0.9  EOSABS 0.0  --  0.0 0.0 0.0 0.1    BASOSABS 0.1  --  0.1 0.0 0.0 0.0    Chemistries  Recent Labs  Lab 04/07/19 0212 04/08/19 0219 04/09/19 0452 04/10/19 0423 04/11/19 0428 04/12/19 0359  NA 129* 127* 125* 130* 130* 134*  K 3.5 3.4* 3.2* 3.9 3.7 4.1  CL 96* 92* 90* 99 96* 101  CO2 23 23 25 25 26 26   GLUCOSE 139* 102* 137* 130* 122* 99  BUN 5* <5* <5* 6* 9 10  CREATININE 0.53 0.48 0.41* 0.38* 0.35* 0.51  CALCIUM 7.8* 8.1* 8.1* 7.9* 8.0* 8.4*  MG 1.8 1.8 1.8 2.3 2.1  --   AST 18 16 21 26  37  --  ALT 13 14 15 16 22   --   ALKPHOS 84 75 68 69 64  --   BILITOT 0.4 0.5 0.3 0.2* <0.1*  --    ------------------------------------------------------------------------------------------------------------------ Recent Labs    04/11/19 0428  TRIG 55    Lab Results  Component Value Date   HGBA1C 5.8 (H) 04/06/2019   ------------------------------------------------------------------------------------------------------------------ No results for input(s): TSH, T4TOTAL, T3FREE, THYROIDAB in the last 72 hours.  Invalid input(s): FREET3 ------------------------------------------------------------------------------------------------------------------ No results for input(s): VITAMINB12, FOLATE, FERRITIN, TIBC, IRON, RETICCTPCT in the last 72 hours.  Coagulation profile Recent Labs  Lab 04/06/19 0404  INR 1.2    No results for input(s): DDIMER in the last 72 hours.  Cardiac Enzymes No results for input(s): CKMB, TROPONINI, MYOGLOBIN in the last 168 hours.  Invalid input(s): CK ------------------------------------------------------------------------------------------------------------------ No results found for: BNP  Micro Results Recent Results (from the past 240 hour(s))  Urine culture     Status: Abnormal   Collection Time: 04/05/19  8:45 AM   Specimen: Urine, Random  Result Value Ref Range Status   Specimen Description URINE, RANDOM  Final   Special Requests NONE  Final   Culture (A)  Final    <10,000  COLONIES/mL INSIGNIFICANT GROWTH Performed at [facility], [address], [place], [place] [address]    Report Status 04/06/2019 FINAL  Final  SARS Coronavirus 2 [facility] order, Performed in [facility] hospital lab) Nasopharyngeal Nasopharyngeal Swab     Status: None   Collection Time: 04/05/19  2:45 PM   Specimen: Nasopharyngeal Swab  Result Value Ref Range Status   SARS Coronavirus 2 NEGATIVE NEGATIVE Final    Comment: (NOTE) If result is NEGATIVE SARS-CoV-2 target nucleic acids are NOT DETECTED. The SARS-CoV-2 RNA is generally detectable in upper and lower  respiratory specimens during the acute phase of infection. The lowest  concentration of SARS-CoV-2 viral copies this assay can detect is 250  copies / mL. A negative result does not preclude SARS-CoV-2 infection  and should not be used as the sole basis for treatment or other  patient management decisions.  A negative result may occur with  improper specimen collection / handling, submission of specimen other  than nasopharyngeal swab, presence of viral mutation(s) within the  areas targeted by this assay, and inadequate number of viral copies  (<250 copies / mL). A negative result must be combined with clinical  observations, patient history, and epidemiological information. If result is POSITIVE SARS-CoV-2 target nucleic acids are DETECTED. The SARS-CoV-2 RNA is generally detectable in upper and lower  respiratory specimens dur ing the acute phase of infection.  Positive  results are indicative of active infection with SARS-CoV-2.  Clinical  correlation with patient history and other diagnostic information is  necessary to determine patient infection status.  Positive results do  not rule out bacterial infection or co-infection with other viruses. If result is PRESUMPTIVE POSTIVE SARS-CoV-2 nucleic acids MAY BE PRESENT.   A presumptive positive result was obtained on the submitted specimen  and confirmed on repeat  testing.  While 2019 novel coronavirus  (SARS-CoV-2) nucleic acids may be present in the submitted sample  additional confirmatory testing may be necessary for epidemiological  and / or clinical management purposes  to differentiate between  SARS-CoV-2 and other Sarbecovirus currently known to infect humans.  If clinically indicated additional testing with an alternate test  methodology [phone]) is advised. The SARS-CoV-2 RNA is generally  detectable in upper and lower respiratory sp ecimens during the  acute  phase of infection. The expected result is Negative. Fact Sheet for Patients:  BoilerBrush.com.cy Fact Sheet for Healthcare Providers: https://pope.com/ This test is not yet approved or cleared by the [place] FDA and has been authorized for detection and/or diagnosis of SARS-CoV-2 by FDA under an Emergency Use Authorization (EUA).  This EUA will remain in effect (meaning this test can be used) for the duration of the COVID-19 declaration under Section 564(b)(1) of the Act, 21 U.S.C. section 360bbb-3(b)(1), unless the authorization is terminated or revoked sooner. Performed at [facility], [address] [place], [place] [address]   Aerobic/Anaerobic Culture (surgical/deep wound)     Status: Abnormal   Collection Time: 04/05/19  6:49 PM   Specimen: Abscess  Result Value Ref Range Status   Specimen Description ABSCESS  Final   Special Requests Normal  Final   Gram Stain   Final    FEW WBC PRESENT, PREDOMINANTLY PMN MODERATE GRAM POSITIVE COCCI Performed at [facility], [address], [place], [place] [address]    Culture (A)  Final    MULTIPLE ORGANISMS PRESENT, NONE PREDOMINANT MIXED ANAEROBIC FLORA PRESENT.  CALL LAB IF FURTHER IID REQUIRED.    Report Status 04/09/2019 FINAL  Final    Radiology Reports Ct Head Wo Contrast  Result Date: 04/06/2019 CLINICAL DATA:  Altered level of consciousness  today. EXAM: CT HEAD WITHOUT CONTRAST TECHNIQUE: Contiguous axial images were obtained from the base of the skull through the vertex without intravenous contrast. COMPARISON:  Head CT scan 06/04/2014. FINDINGS: Brain: No evidence of acute infarction, hemorrhage, hydrocephalus, extra-axial collection or mass lesion/mass effect. Chronic microvascular ischemic change has markedly worsened since the prior head CT. Left frontal infarct is remote but new since the prior CT. Vascular: No hyperdense vessel or unexpected calcification. Skull: Intact.  No focal lesion. Sinuses/Orbits: Negative. Other: None. IMPRESSION: No acute abnormality. Extensive chronic microvascular ischemic change. Remote left frontal infarct noted. Electronically Signed   By: Drusilla Kanner M.D.   On: 04/06/2019 13:34   Mr Brain Wo Contrast  Result Date: 04/07/2019 CLINICAL DATA:  Encephalopathy. EXAM: MRI HEAD WITHOUT CONTRAST TECHNIQUE: Multiplanar, multiecho pulse sequences of the brain and surrounding structures were obtained without intravenous contrast. COMPARISON:  Head CT 04/06/2019, brain MRI 02/05/2009 FINDINGS: Brain: Several sequences are motion degraded. There is no evidence of acute infarct. No evidence of intracranial mass. No midline shift or extra-axial fluid collection. Foci of encephalomalacia and chronic hemosiderin deposition within the left frontal lobe at sites of previous hemorrhagic contusions. Advanced scattered T2/FLAIR hyperintensity within the cerebral white matter has progressed since prior MRI 02/05/2009. However, the previously described large right frontal parietal white matter lesion has not significantly changed. A chronic lacunar infarct within the right thalamus is also new from this prior MRI. Mild generalized parenchymal atrophy. Vascular: Flow voids maintained within the proximal large arterial vessels. Skull and upper cervical spine: Normal marrow signal. Incompletely assessed cervical spondylosis with  C4-C5 posterior disc osteophyte complex. Sinuses/Orbits: Imaged globes and orbits demonstrate no acute abnormality. Mild ethmoid sinus mucosal thickening. Trace fluid within right mastoid air cells IMPRESSION: Motion degraded examination. No evidence of acute intracranial abnormality, including acute infarct. Foci of encephalomalacia within the left frontal lobe, sequela of remote hemorrhagic contusions. Advanced signal changes within the white matter have significantly progressed since remote MRI 02/05/2009. Findings are nonspecific but may reflect advanced chronic small vessel ischemic disease. Alternative etiologies such as a demyelinating process cannot be excluded. Chronic right thalamic  lacunar infarct, new from prior exam. Electronically Signed   By: Jackey Loge   On: 04/07/2019 14:30   Ct Abdomen Pelvis W Contrast  Result Date: 04/12/2019 CLINICAL DATA:  Status post percutaneous catheter drainage of large sigmoid diverticular abscess on 04/05/2019. EXAM: CT ABDOMEN AND PELVIS WITH CONTRAST TECHNIQUE: Multidetector CT imaging of the abdomen and pelvis was performed using the standard protocol following bolus administration of intravenous contrast. CONTRAST:  80mL OMNIPAQUE IOHEXOL 300 MG/ML  SOLN COMPARISON:  04/05/2019 FINDINGS: Lower chest: No acute abnormality. Hepatobiliary: No focal liver abnormality is seen. Status post cholecystectomy. No biliary dilatation. Pancreas: Unremarkable. No pancreatic ductal dilatation or surrounding inflammatory changes. Spleen: Normal in size without focal abnormality. Adrenals/Urinary Tract: Adrenal glands are unremarkable. Kidneys are normal, without renal calculi, focal lesion, or hydronephrosis. Bladder is unremarkable. Stomach/Bowel: Status post prior bowel resection. Probable component of mild colonic ileus. No small bowel dilatation. No free intraperitoneal air is identified. Vascular/Lymphatic: No significant vascular findings are present. No enlarged  abdominal or pelvic lymph nodes. Reproductive: Uterus and bilateral adnexa are unremarkable. Other: At the level of the left lower quadrant percutaneous drainage catheter, there is no further liquefied abscess identified by CT. The abscess cavity is completely decompressed. The drainage catheter lies along the inferior aspect of the distal sigmoid colon with numerous diverticula again visualized. Musculoskeletal: No acute or significant osseous findings. IMPRESSION: 1. Complete evacuation of sigmoid diverticular abscess after percutaneous catheter drainage. The left lower quadrant drainage catheter lies along the inferior aspect of the distal sigmoid colon. 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 2. Probable component of mild colonic ileus. No small bowel obstruction or evidence of bowel perforation. Electronically Signed   By: Irish Lack M.D.   On: 04/12/2019 09:59   Ct Abdomen Pelvis W Contrast  Result Date: 04/05/2019 CLINICAL DATA:  68 year old female with abdominal pain. History of prior perforated duodenal ulcer and exclude Terri laparotomy and pyloroplasty. EXAM: CT ABDOMEN AND PELVIS WITH CONTRAST TECHNIQUE: Multidetector CT imaging of the abdomen and pelvis was performed using the standard protocol following bolus administration of intravenous contrast. CONTRAST:  OMNIPAQUE IOHEXOL 300 MG/ML  SOLN COMPARISON:  CT of the abdomen pelvis dated 08/05/2011. FINDINGS: Evaluation of this exam is limited due to respiratory motion artifact. Lower chest: The visualized lung bases are clear. Minimal right middle lobe atelectasis/scarring. There is mild cardiomegaly. Coronary vascular calcifications noted. No definite intra-abdominal free air or free fluid. Hepatobiliary: A 1 cm hypodense lesion in the dome of the liver is not well characterized but appears similar to prior CT,  likely a cyst. The liver is otherwise unremarkable. There is mild intrahepatic biliary ductal dilatation. No calcified gallstone or pericholecystic fluid. Pancreas: Unremarkable. No pancreatic ductal dilatation or surrounding inflammatory changes. Spleen: Normal in size without focal abnormality. Adrenals/Urinary Tract: The adrenal glands are unremarkable. There is no hydronephrosis on either side. There is symmetric enhancement and excretion of contrast by both kidneys. Subcentimeter left renal hypodense focus is too small to characterize but likely represents a cyst. The urinary bladder is unremarkable. Stomach/Bowel: Extensive sigmoid diverticulosis with active inflammatory changes. There is a 9.7 x 3.7 x 3.8 cm diverticular abscess along the sigmoid colon. Postsurgical changes of gastrojejunostomy. No evidence of bowel obstruction. A fluid containing structure with pockets of air in the upper abdomen anteriorly (series 3, image 27) appears similar to the CT of 2013 and may represent  the duodenal bulb. A 15 mm high attenuating structure within this may represent a chronic stone although an enhancing lesion is not entirely excluded. Vascular/Lymphatic: Moderate aortoiliac atherosclerotic disease. The IVC is unremarkable. No portal venous gas. There is no adenopathy. Reproductive: The uterus is grossly unremarkable. There is loss of fat plane between the superior uterus and diverticular abscess of the sigmoid colon. No adnexal masses. Other: None Musculoskeletal: Osteopenia. No acute osseous pathology. IMPRESSION: 1. Sigmoid diverticulitis with a large diverticular abscess. There is loss of fat plane between the superior uterus and diverticular abscess. 2. A 15 mm high attenuating structure in the anterior upper abdomen appears similar to prior CT and may represent the duodenal bulb. Aortic Atherosclerosis (ICD10-I70.0). Electronically Signed   By: Elgie Collard M.D.   On: 04/05/2019 14:14   Ct Image Guided  Drainage By Percutaneous Catheter  Result Date: 04/06/2019 INDICATION: 68 year old female with sigmoid colonic diverticulitis and large intramural abscess. She presents for CT-guided drain placement. EXAM: CT-guided drain placement MEDICATIONS: The patient is currently admitted to the hospital and receiving intravenous antibiotics. The antibiotics were administered within an appropriate time frame prior to the initiation of the procedure. ANESTHESIA/SEDATION: Fentanyl 100 mcg IV; Versed 2 mg IV Moderate Sedation Time:  17 minutes The patient was continuously monitored during the procedure by the interventional radiology nurse under my direct supervision. COMPLICATIONS: None immediate. PROCEDURE: Informed written consent was obtained from the patient after a thorough discussion of the procedural risks, benefits and alternatives. All questions were addressed. Maximal Sterile Barrier Technique was utilized including caps, mask, sterile gowns, sterile gloves, sterile drape, hand hygiene and skin antiseptic. A timeout was performed prior to the initiation of the procedure. A planning axial CT scan was performed. The fluid and gas collection in the left lower quadrant of the anatomic pelvis position between the sigmoid colon, uterus and bladder was successfully identified. A suitable skin entry site was selected and marked. Care was taken to ensure that the entry site was below the redundant portion of the sigmoid colon. After sterile prep and drape in the standard fashion with chlorhexidine, local anesthesia was attained by infiltration with 1% lidocaine. A small dermatotomy was made. Under intermittent CT guidance, an 18 gauge trocar needle was advanced into the fluid and gas collection. A 0.035 wire was then advanced into the fluid and gas collection. The needle was exchanged for a fascial dilator and the soft tissue tract was dilated to 12 [place]. A Cook 12 [place] all-purpose drainage catheter modified with  additional sideholes was then advanced over the wire and formed. Aspiration yields approximately 80 cc of thick purulent material. A sample was sent for Gram stain and culture. The abscess cavity was lavaged with sterile saline and the drain connected to JP bulb suction. The drain was then secured to the skin with 0 Prolene suture and an adhesive fixation device. The patient tolerated the procedure well. IMPRESSION: Successful placement of a 12 French drainage catheter modified with additional sideholes. Aspiration yields 80 mL of frankly purulent and foul-smelling fluid. Samples were sent for Gram stain and culture. PLAN: Maintain drain to JP bulb suction. Flush drainage catheter at least once per shift. When drain output has decreased significantly, recommend follow-up with both contrast-enhanced CT scan of the pelvis to assess for resolution of the abscess as well as drain injection to assess for fistula with the sigmoid colon given the intramural location of the abscess. Signed, Sterling Big, MD, RPVI [facility]  Radiology Electronically Signed   By: Malachy Moan M.D.   On: 04/06/2019 12:42   [place] Ekg Site Rite  Result Date: 04/08/2019 If Site Rite image not attached, placement could not be confirmed due to current cardiac rhythm.   Time Spent in minutes  30   Susa Raring M.D on 04/12/2019 at 10:46 AM  To page go to [email or web address] - password [email or web address]

## 2019-04-12 NOTE — Plan of Care (Signed)
  Problem: Activity: Goal: Risk for activity intolerance will decrease Outcome: Progressing   Problem: Nutrition: Goal: Adequate nutrition will be maintained Outcome: Progressing   Problem: Elimination: Goal: Will not experience complications related to bowel motility Outcome: Progressing   

## 2019-04-12 NOTE — Progress Notes (Signed)
[facility] Progress Note     Subjective: CC-  Up in chair, husband in room. No complaints. Tolerating soft food. Denies abdominal pain, nausea, vomiting. Continues to have loose BMs.  Going for CT scan today.  Objective: Vital signs in last 24 hours: Temp:  [97.5 F (36.4 C)-98.5 F (36.9 C)] 97.5 F (36.4 C) (09/22 0406) Pulse Rate:  [65-79] 65 (09/22 0406) Resp:  [14-18] 18 (09/22 0406) BP: (112-158)/(58-76) 144/76 (09/22 0406) SpO2:  [98 %-100 %] 98 % (09/22 0406) Last BM Date: 04/11/19  Intake/Output from previous day: 09/21 0701 - 09/22 0700 In: 1161.1 [P.O.:840; I.V.:221.1; IV Piggyback:100] Out: 530 [Urine:425; Drains:105] Intake/Output this shift: No intake/output data recorded.  PE: Gen:  Alert, NAD, cooperative HEENT: EOM's intact, pupils equal and round Pulm:  Rate and effort normal Abd: Soft, NT/ND, +BS, drain with brown fluid in bulb (105cc last 24 hours) Ext:  Calves soft and nontender Skin: no rashes noted, warm and dry   Lab Results:  Recent Labs    04/10/19 0423 04/11/19 0428  WBC 9.9 8.2  HGB 13.0 12.0  HCT 36.9 34.2*  PLT 391 387   BMET Recent Labs    04/11/19 0428 04/12/19 0359  NA 130* 134*  K 3.7 4.1  CL 96* 101  CO2 26 26  GLUCOSE 122* 99  BUN 9 10  CREATININE 0.35* 0.51  CALCIUM 8.0* 8.4*   PT/INR No results for input(s): LABPROT, INR in the last 72 hours. CMP     Component Value Date/Time   NA 134 (L) 04/12/2019 0359   K 4.1 04/12/2019 0359   CL 101 04/12/2019 0359   CO2 26 04/12/2019 0359   GLUCOSE 99 04/12/2019 0359   BUN 10 04/12/2019 0359   CREATININE 0.51 04/12/2019 0359   CALCIUM 8.4 (L) 04/12/2019 0359   PROT 5.2 (L) 04/11/2019 0428   ALBUMIN 2.6 (L) 04/11/2019 0428   AST 37 04/11/2019 0428   ALT 22 04/11/2019 0428   ALKPHOS 64 04/11/2019 0428   BILITOT <0.1 (L) 04/11/2019 0428   GFRNONAA >60 04/12/2019 0359   GFRAA >60 04/12/2019 0359   Lipase     Component Value Date/Time   LIPASE  17 04/04/2019 1846       Studies/Results: No results found.  Anti-infectives: Anti-infectives (From admission, onward)   Start     Dose/Rate Route Frequency Ordered Stop   04/06/19 1400  cefTRIAXone (ROCEPHIN) 2 g in sodium chloride 0.9 % 100 mL IVPB     2 g 200 mL/hr over 30 Minutes Intravenous Every 24 hours 04/05/19 1729     04/06/19 0000  metroNIDAZOLE (FLAGYL) IVPB 500 mg     500 mg 100 mL/hr over 60 Minutes Intravenous Every 8 hours 04/05/19 1729     04/05/19 1500  cefTRIAXone (ROCEPHIN) 2 g in sodium chloride 0.9 % 100 mL IVPB     2 g 200 mL/hr over 30 Minutes Intravenous  Once 04/05/19 1450 04/05/19 1547   04/05/19 1445  ceFEPIme (MAXIPIME) 2 g in sodium chloride 0.9 % 100 mL IVPB  Status:  Discontinued     2 g 200 mL/hr over 30 Minutes Intravenous Every 12 hours 04/05/19 1444 04/05/19 1450   04/05/19 1445  metroNIDAZOLE (FLAGYL) IVPB 500 mg     500 mg 100 mL/hr over 60 Minutes Intravenous  Once 04/05/19 1444 04/05/19 1659   04/05/19 0945  cephALEXin (KEFLEX) capsule 500 mg     500 mg Oral  Once 04/05/19 0937 04/05/19 0950  Assessment/Plan HTN H/o alcoholism Anxiety/insomnia Hx malabsorption syndrome 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  Severe sigmoid diverticulitis with 9.7 x 3.7 x 3.8 cm diverticular abscess - IR drain placement 9/15. Culture with multiple organisms present none predominant. - WBC 8.2 (9/21), afebrile - tolerating soft diet - drain output brown, fistula  ID - rocephin/flagyl 9/16>> FEN - soft diet, Boost/Ensure VTE - SCDs, lovenox Foley - none Follow up - IR, colorectal surgeon  Plan - CT scan today, will make further recommendations once this is complete. Continue drain and  antibiotics. Continue soft diet and supplements, d/c TPN today. If CT scan stable or improved she is ok for discharge with drain and oral antibiotics from surgical standpoint. Will arrange follow up in our office with colorectal surgeon.    LOS: 7 days    Franne Forts , [facility] 04/12/2019, 8:47 AM Pager: [phone] Mon-Thurs 7:00 am-4:30 pm Fri 7:00 am -11:30 AM Sat-Sun 7:00 am-11:30 am

## 2019-04-12 NOTE — Progress Notes (Signed)
Nutrition Follow-up  RD working remotely.  DOCUMENTATION CODES:   Underweight  INTERVENTION:   -Continue MVI with minerals daily -D/c Boost Breeze po TID, each supplement provides 250 kcal and 9 grams of protein -Ensure Enlive po TID, each supplement provides 350 kcal and 20 grams of protein  NUTRITION DIAGNOSIS:   Inadequate oral intake related to altered GI function as evidenced by meal completion < 25%.  Ongoing  GOAL:   Patient will meet greater than or equal to 90% of their needs  Progressing   MONITOR:   PO intake, Supplement acceptance, Diet advancement, Labs, Weight trends, Skin, I & O's  REASON FOR ASSESSMENT:   Malnutrition Screening Tool, Consult Assessment of nutrition requirement/status  ASSESSMENT:   [name] is a 68 y.o. female with medical history significant of hypertension, hypothyroidism, alcoholism, anxiety, and duodenal ulcer perforation s/p surgical repair; presents with complaints of abdominal pain over the last 3-4 days.  History is obtained from the patient's husband over the phone as the patient is confused.  He reports at baseline she has been confused for quite some time and he needs to have her formally evaluated.  Pain is located in the left lower quadrant of her abdomen.  She had reportedly gotten sick 2 weeks ago and had thrown up once. Since that time he had put her on a soft diet.  She has been doing okay, but over the last last week had stopped eating altogether.  Associated symptoms include complaints of headache.  She denies having any other complaints at this time.  Husband denies any knowledge of any blood in stool, fever, or chest pain symptoms.  He is not on blood thinners.  She has not drank alcohol in quite some time.  9/15- s/p diverticular abscess drain placement (apiration yielded 80 ml frank pus) 9/18- PICC placed, TPN initiated 9/21- advanced to soft diet 9/22- TPN d/c  Reviewed I/O's: +631 ml x 24 hours and +5.6 L since  admission  UOP: 425 ml x 24 hours  Drain output: 105 ml x 24 hours  Per IR notes, plan for drain injection today to rule out bowel fistula. Per MD notes, pt may discharge home today after IR procedure.  Pt advanced to soft diet. Noted pt continues with poor oral intake (15-25%). Pt is taking Boost Breeze supplements per MAR.  Pt receiving TPN @ 30 ml/hr, providing 1022 kcals and 49 grams protein, meeting 60% of estimated kcal needs and 58% of estimated protein needs. Per pharmacy note, plan to d/c TPN today.  Labs reviewed: Na: 134, CBGS: 84-120.   Diet Order:   Diet Order            DIET SOFT Room service appropriate? Yes; Fluid consistency: Thin  Diet effective now              EDUCATION NEEDS:   No education needs have been identified at this time  Skin:  Skin Assessment: Reviewed RN Assessment  Last BM:  04/11/19  Height:   Ht Readings from Last 1 Encounters:  04/06/19 5\' 3"  (1.6 m)    Weight:   Wt Readings from Last 1 Encounters:  04/06/19 42.7 kg    Ideal Body Weight:  52.3 kg  BMI:  Body mass index is 16.68 kg/m.  Estimated Nutritional Needs:   Kcal:  1700-1900  Protein:  85-100 grams  Fluid:  > 1.7 L    Carleton [place] A. Jimmye Norman, RD, LDN, [place] Registered Dietitian II Certified Diabetes Care and  Education Specialist Pager: [phone] After hours Pager: [phone]

## 2019-04-13 ENCOUNTER — Ambulatory Visit: Payer: PPO | Admitting: Nurse Practitioner

## 2019-04-13 DIAGNOSIS — K5720 Diverticulitis of large intestine with perforation and abscess without bleeding: Principal | ICD-10-CM

## 2019-04-13 DIAGNOSIS — E871 Hypo-osmolality and hyponatremia: Secondary | ICD-10-CM

## 2019-04-13 DIAGNOSIS — I10 Essential (primary) hypertension: Secondary | ICD-10-CM

## 2019-04-13 MED ORDER — SODIUM CHLORIDE 0.9 % IV SOLN
510.0000 mg | Freq: Once | INTRAVENOUS | Status: AC
  Administered 2019-04-13: 510 mg via INTRAVENOUS
  Filled 2019-04-13: qty 17

## 2019-04-13 NOTE — Progress Notes (Signed)
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   Drain to remain in place.  No flushes.   Per notes, patient is nearing discharge home.  Orders placed for patient to have outpatient follow-up with Interventional Radiology.  Schedulers will call with date and time of appointment.   Brynda Greathouse, MS RD PA-C

## 2019-04-13 NOTE — Plan of Care (Signed)

## 2019-04-13 NOTE — Progress Notes (Signed)
Patient refused to have dressing changed for her drain this morning. Will endorse to the oncoming shift.

## 2019-04-13 NOTE — Progress Notes (Signed)
[facility] Progress Note     Subjective: CC-  Husband at bedside. Patient sleeping. Husband states that she was wore out from all the imaging and studies from yesterday. She also ambulated multiple times and spent hours up in the chair. She woke up a few hours ago and ate 100% of her breakfast.  Objective: Vital signs in last 24 hours: Temp:  [97.6 F (36.4 C)-98.5 F (36.9 C)] 98.2 F (36.8 C) (09/23 0628) Pulse Rate:  [70-83] 70 (09/23 0628) Resp:  [18] 18 (09/23 0628) BP: (126-156)/(62-93) 145/86 (09/23 0628) SpO2:  [98 %-100 %] 98 % (09/23 0628) Last BM Date: 04/12/19  Intake/Output from previous day: 09/22 0701 - 09/23 0700 In: 2746.1 [P.O.:1080; I.V.:516.1; IV Piggyback:1150.1] Out: 25 [Drains:25] Intake/Output this shift: No intake/output data recorded.  PE: Gen: Sleeping, NAD Pulm:Rate andeffort normal Abd: Soft, NT/ND, +BS,drain with brown fluid in bag (25cc last 24 hours) QVZ:DGLOVF soft and nontender Skin: no rashes noted, warm and dry   Lab Results:  Recent Labs    04/11/19 0428  WBC 8.2  HGB 12.0  HCT 34.2*  PLT 387   BMET Recent Labs    04/11/19 0428 04/12/19 0359  NA 130* 134*  K 3.7 4.1  CL 96* 101  CO2 26 26  GLUCOSE 122* 99  BUN 9 10  CREATININE 0.35* 0.51  CALCIUM 8.0* 8.4*   PT/INR No results for input(s): LABPROT, INR in the last 72 hours. CMP     Component Value Date/Time   NA 134 (L) 04/12/2019 0359   K 4.1 04/12/2019 0359   CL 101 04/12/2019 0359   CO2 26 04/12/2019 0359   GLUCOSE 99 04/12/2019 0359   BUN 10 04/12/2019 0359   CREATININE 0.51 04/12/2019 0359   CALCIUM 8.4 (L) 04/12/2019 0359   PROT 5.2 (L) 04/11/2019 0428   ALBUMIN 2.6 (L) 04/11/2019 0428   AST 37 04/11/2019 0428   ALT 22 04/11/2019 0428   ALKPHOS 64 04/11/2019 0428   BILITOT <0.1 (L) 04/11/2019 0428   GFRNONAA >60 04/12/2019 0359   GFRAA >60 04/12/2019 0359   Lipase     Component Value Date/Time   LIPASE 17 04/04/2019 1846        Studies/Results: Ct Abdomen Pelvis W Contrast  Result Date: 04/12/2019 CLINICAL DATA:  Status post percutaneous catheter drainage of large sigmoid diverticular abscess on 04/05/2019. EXAM: CT ABDOMEN AND PELVIS WITH CONTRAST TECHNIQUE: Multidetector CT imaging of the abdomen and pelvis was performed using the standard protocol following bolus administration of intravenous contrast. CONTRAST:  31mL OMNIPAQUE IOHEXOL 300 MG/ML  SOLN COMPARISON:  04/05/2019 FINDINGS: Lower chest: No acute abnormality. Hepatobiliary: No focal liver abnormality is seen. Status post cholecystectomy. No biliary dilatation. Pancreas: Unremarkable. No pancreatic ductal dilatation or surrounding inflammatory changes. Spleen: Normal in size without focal abnormality. Adrenals/Urinary Tract: Adrenal glands are unremarkable. Kidneys are normal, without renal calculi, focal lesion, or hydronephrosis. Bladder is unremarkable. Stomach/Bowel: Status post prior bowel resection. Probable component of mild colonic ileus. No small bowel dilatation. No free intraperitoneal air is identified. Vascular/Lymphatic: No significant vascular findings are present. No enlarged abdominal or pelvic lymph nodes. Reproductive: Uterus and bilateral adnexa are unremarkable. Other: At the level of the left lower quadrant percutaneous drainage catheter, there is no further liquefied abscess identified by CT. The abscess cavity is completely decompressed. The drainage catheter lies along the inferior aspect of the distal sigmoid colon with numerous diverticula again visualized. Musculoskeletal: No acute or significant osseous findings. IMPRESSION: 1.  Complete evacuation of sigmoid diverticular abscess after percutaneous catheter drainage. The left lower quadrant drainage catheter lies along the inferior aspect of the distal sigmoid colon. Prior to catheter removal, drainage catheter injection with contrast under fluoroscopy is recommended to evaluate for  persistent fistula to the sigmoid colonic lumen. 2. Probable component of mild colonic ileus. No small bowel obstruction or evidence of bowel perforation. Electronically Signed   By: Irish Lack M.D.   On: 04/12/2019 09:59   Ir Sinus/fist Tube Chk-non Gi  Result Date: 04/12/2019 CLINICAL DATA:  History of diverticular abscess, post percutaneous drainage catheter placement 04/05/2019; subsequent CT scan the abdomen and pelvis performed 04/12/2019 demonstrates near complete resolution of the pericolonic diverticular abscess. Patient reports very little output from the percutaneous drainage catheter and as such presents today for drainage catheter injection. EXAM: SINUS TRACT INJECTION/FISTULOGRAM COMPARISON:  CT-guided percutaneous drainage catheter placement-04/05/2019; CT abdomen and pelvis-04/05/2019; 04/12/2019 CONTRAST:  85mL OMNIPAQUE IOHEXOL 300 MG/ML  SOLN FLUOROSCOPY TIME:  30 seconds (2 mGy) TECHNIQUE: The patient was positioned supine on the fluoroscopy table. A preprocedural spot fluoroscopic image was obtained of the left lower abdomen/pelvis and existing percutaneous drainage catheter Multiple spot fluoroscopic and radiographic images were obtained following the injection of a small amount of contrast via the existing percutaneous drainage catheter. Images reviewed and the decision was made to maintain the percutaneous drainage catheter. As such, the drainage catheter was flushed with a small of saline and converted from a JP bulb to a gravity bag. A dressing was applied. The patient tolerated the procedure well without immediate postprocedural complication. FINDINGS: Preprocedural spot fluoroscopic image demonstrates unchanged positioning pre-existing percutaneous drainage catheter with end coiled and locked over the left lower abdomen/pelvis. A small amount of excreted contrast from recent CT scan is seen within the urinary bladder Contrast injection demonstrates opacification of decompressed  abscess cavity with fistulous connection between the residual collection in the adjacent sigmoid colon. 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 PLAN: - The patient's percutaneous drainage catheter was converted from a JP bulb to a gravity bag. - The patient was instructed to no longer flush the percutaneous drainage catheter however to maintain diligent records with getting daily drainage catheter output - As the patient is impending discharge, she will present to the interventional radiology drain clinic in 2 weeks for drainage catheter evaluation and management and repeat drainage catheter injection. - note, CT imaging is not required (unless patient has recurrence of abdominal pain or fever), as CT scan performed 04/12/2019 demonstrated resolution of the pericolonic diverticular abscess. Electronically Signed   By: Simonne Come M.D.   On: 04/12/2019 15:42    Anti-infectives: Anti-infectives (From admission, onward)   Start     Dose/Rate Route Frequency Ordered Stop   04/06/19 1400  cefTRIAXone (ROCEPHIN) 2 g in sodium chloride 0.9 % 100 mL IVPB     2 g 200 mL/hr over 30 Minutes Intravenous Every 24 hours 04/05/19 1729     04/06/19 0000  metroNIDAZOLE (FLAGYL) IVPB 500 mg     500 mg 100 mL/hr over 60 Minutes Intravenous Every 8 hours 04/05/19 1729     04/05/19 1500  cefTRIAXone (ROCEPHIN) 2 g in sodium chloride 0.9 % 100 mL IVPB     2 g 200 mL/hr over 30 Minutes Intravenous  Once 04/05/19 1450 04/05/19 1547   04/05/19 1445  ceFEPIme (MAXIPIME) 2 g in sodium chloride 0.9 % 100 mL IVPB  Status:  Discontinued     2 g 200 mL/hr over 30 Minutes Intravenous Every 12 hours 04/05/19 1444 04/05/19 1450   04/05/19 1445  metroNIDAZOLE (FLAGYL) IVPB 500 mg     500 mg 100 mL/hr over 60 Minutes Intravenous  Once 04/05/19 1444 04/05/19 1659   04/05/19 0945  cephALEXin (KEFLEX) capsule 500 mg     500 mg Oral   Once 04/05/19 7034 04/05/19 0950       Assessment/Plan HTN H/o alcoholism Anxiety/insomnia Hx malabsorption syndrome 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  Severe sigmoid diverticulitis with 9.7 x 3.7 x 3.8 cm diverticular abscess -IR drain placement 9/15. Culture with multiple organisms present none predominant. 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 She will go home with the drain. Recommend 2 weeks of oral antibiotics. Follow up with IR next week. Follow up with Dr. Maisie Fus on AVS.   LOS: 8 days    Franne Forts , [facility] Surgery 04/13/2019, 9:01 AM Pager: [phone] Mon-Thurs 7:00 am-4:30 pm Fri 7:00 am -11:30 AM Sat-Sun 7:00 am-11:30 am

## 2019-04-13 NOTE — Progress Notes (Signed)
PROGRESS NOTE        PATIENT DETAILS Name: [name] Age: 68 y.o. Sex: female Date of Birth: [birth year] Admit Date: 04/04/2019 Admitting Physician Norval Morton, MD [id number], Doretha Sou, MD  Brief Narrative: Patient is a 68 y.o. female with history of HTN, anxiety, PUD with duodenal ulcer perforation s/p surgical repair, alcoholism who presented to the hospital with abdominal pain, worsening confusion-was found to have acute metabolic encephalopathy, diverticular abscess.  See below for further details.  Subjective: Lying comfortably in bed-complaining of some abdominal pain-had a "rough night"  Assessment/Plan: Diverticular abscess with fistula formation: Seen by CCS/IR-underwent left lower quadrant JP drain placement on 9/21.  CT scan on 9/22 showed resolution of the abscess but upon further evaluation by IR-appears to have a fistula formation.  Plans are to stop TNA today-advance diet as tolerated.  If continues to do well-we will plan on discharging home on 9/24.  Discussed with surgery-on discharge she will require 2 additional weeks of antimicrobial therapy-currently remains on IV Rocephin/Flagyl.  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  MRI brain without any acute abnormalities.  B12, TSH and RPR stable.  Hyponatremia: Improved following fluid restriction and 1 dose of Samsca  History of malabsorption: Iron levels borderline low-we will replete with IV iron supplementation today (per family request).  Further monitoring can be done in the outpatient setting.  Alcohol abuse: No signs of DTs-treated with CIWA protocol.  Counseled to quit  Nutrition Problem: Nutrition Problem: Inadequate oral intake Etiology: altered GI function Signs/Symptoms: meal completion < 25% Interventions: Ensure Enlive (each supplement provides 350kcal and 20 grams of protein), MVI  Diet: Diet  Order            DIET SOFT Room service appropriate? Yes; Fluid consistency: Thin  Diet effective now               DVT Prophylaxis: Prophylactic Lovenox   Code Status: Full code   Family Communication: Spouse at bedside  Disposition Plan: Remain inpatient-hopefully home on 9/24 if clinical improvement continues.  Antimicrobial agents: Anti-infectives (From admission, onward)   Start     Dose/Rate Route Frequency Ordered Stop   04/06/19 1400  cefTRIAXone (ROCEPHIN) 2 g in sodium chloride 0.9 % 100 mL IVPB     2 g 200 mL/hr over 30 Minutes Intravenous Every 24 hours 04/05/19 1729     04/06/19 0000  metroNIDAZOLE (FLAGYL) IVPB 500 mg     500 mg 100 mL/hr over 60 Minutes Intravenous Every 8 hours 04/05/19 1729     04/05/19 1500  cefTRIAXone (ROCEPHIN) 2 g in sodium chloride 0.9 % 100 mL IVPB     2 g 200 mL/hr over 30 Minutes Intravenous  Once 04/05/19 1450 04/05/19 1547   04/05/19 1445  ceFEPIme (MAXIPIME) 2 g in sodium chloride 0.9 % 100 mL IVPB  Status:  Discontinued     2 g 200 mL/hr over 30 Minutes Intravenous Every 12 hours 04/05/19 1444 04/05/19 1450   04/05/19 1445  metroNIDAZOLE (FLAGYL) IVPB 500 mg     500 mg 100 mL/hr over 60 Minutes Intravenous  Once 04/05/19 1444 04/05/19 1659   04/05/19 0945  cephALEXin (KEFLEX) capsule 500 mg     500 mg Oral  Once 04/05/19 0937 04/05/19 0950  Procedures: 9/15>> left lower quadrant JP drain placement 9/22>> sinus tract injection/fistulogram  CONSULTS:  general surgery and IR  Time spent: 25 minutes-Greater than 50% of this time was spent in counseling, explanation of diagnosis, planning of further management, and coordination of care.  MEDICATIONS: Scheduled Meds:  amLODipine  10 mg Oral Daily   aspirin  81 mg Oral Daily   Chlorhexidine Gluconate Cloth  6 each Topical Daily   docusate sodium  200 mg Oral BID   enoxaparin (LOVENOX) injection  40 mg Subcutaneous Q24H   feeding supplement (ENSURE ENLIVE)   237 mL Oral TID BM   folic acid  1 mg Oral Daily   multivitamin with minerals  1 tablet Oral Daily   pantoprazole  40 mg Oral BID   saccharomyces boulardii  250 mg Oral BID   sodium chloride flush  5 mL Intracatheter Q8H   thiamine  100 mg Oral Daily   Continuous Infusions:  cefTRIAXone (ROCEPHIN)  IV 2 g (04/12/19 1415)   metronidazole 500 mg (04/13/19 0802)   PRN Meds:.acetaminophen **OR** acetaminophen, haloperidol lactate, hydrALAZINE, ondansetron **OR** ondansetron (ZOFRAN) IV, sodium chloride flush   PHYSICAL EXAM: Vital signs: Vitals:   04/12/19 0406 04/12/19 1437 04/12/19 2053 04/13/19 0628  BP: (!) 144/76 126/62 (!) 156/93 (!) 145/86  Pulse: 65 76 83 70  Resp: 18 18 18 18   Temp: (!) 97.5 F (36.4 C) 98.5 F (36.9 C) 97.6 F (36.4 C) 98.2 F (36.8 C)  TempSrc: Oral Oral Oral Oral  SpO2: 98% 100% 98% 98%  Weight:      Height:       Filed Weights   04/06/19 0634  Weight: 42.7 kg   Body mass index is 16.68 kg/m.   Gen Exam:Alert awake-not in any distress HEENT:atraumatic, normocephalic Chest: B/L clear to auscultation anteriorly CVS:S1S2 regular Abdomen:soft non tender, non distended Extremities:no edema Neurology: Non focal Skin: no rash  I have personally reviewed following labs and imaging studies  LABORATORY DATA: CBC: Recent Labs  Lab 04/07/19 0212 04/07/19 1139 04/08/19 0219 04/09/19 0452 04/10/19 0423 04/11/19 0428  WBC 9.6  --  8.1 11.9* 9.9 8.2  NEUTROABS 7.1  --  5.5 9.9* 7.7 5.9  HGB 13.3  --  14.0 13.1 13.0 12.0  HCT 37.6 38.3 37.8 36.1 36.9 34.2*  MCV 93.3  --  92.2 92.8 94.6 96.1  PLT 406*  --  379 375 391 387    Basic Metabolic Panel: Recent Labs  Lab 04/06/19 1217  04/07/19 0212 04/08/19 0219 04/09/19 0452 04/10/19 0423 04/11/19 0428 04/12/19 0359  NA  --    < > 129* 127* 125* 130* 130* 134*  K  --    < > 3.5 3.4* 3.2* 3.9 3.7 4.1  CL  --    < > 96* 92* 90* 99 96* 101  CO2  --    < > 23 23 25 25 26 26     GLUCOSE  --    < > 139* 102* 137* 130* 122* 99  BUN  --    < > 5* <5* <5* 6* 9 10  CREATININE  --    < > 0.53 0.48 0.41* 0.38* 0.35* 0.51  CALCIUM  --    < > 7.8* 8.1* 8.1* 7.9* 8.0* 8.4*  MG 1.8  --  1.8 1.8 1.8 2.3 2.1  --   PHOS 3.7  --   --   --  2.9 3.1 2.8  --    < > =  values in this interval not displayed.    GFR: Estimated Creatinine Clearance: 46 mL/min (by C-G formula based on SCr of 0.51 mg/dL).  Liver Function Tests: Recent Labs  Lab 04/07/19 0212 04/08/19 0219 04/09/19 0452 04/10/19 0423 04/11/19 0428  AST 18 16 21 26  37  ALT 13 14 15 16 22   ALKPHOS 84 75 68 69 64  BILITOT 0.4 0.5 0.3 0.2* <0.1*  PROT 5.8* 5.8* 5.5* 5.9* 5.2*  ALBUMIN 2.9* 2.9* 2.6* 2.6* 2.6*   No results for input(s): LIPASE, AMYLASE in the last 168 hours. No results for input(s): AMMONIA in the last 168 hours.  Coagulation Profile: No results for input(s): INR, PROTIME in the last 168 hours.  Cardiac Enzymes: No results for input(s): CKTOTAL, CKMB, CKMBINDEX, TROPONINI in the last 168 hours.  BNP (last 3 results) No results for input(s): PROBNP in the last 8760 hours.  HbA1C: No results for input(s): HGBA1C in the last 72 hours.  CBG: Recent Labs  Lab 04/10/19 1644 04/10/19 2041 04/11/19 0645 04/11/19 1234 04/11/19 1756  GLUCAP 78 119* 120* 84 101*    Lipid Profile: Recent Labs    04/11/19 0428  TRIG 55    Thyroid Function Tests: No results for input(s): TSH, T4TOTAL, FREET4, T3FREE, THYROIDAB in the last 72 hours.  Anemia Panel: No results for input(s): VITAMINB12, FOLATE, FERRITIN, TIBC, IRON, RETICCTPCT in the last 72 hours.  Urine analysis:    Component Value Date/Time   COLORURINE AMBER (A) 04/05/2019 0900   APPEARANCEUR HAZY (A) 04/05/2019 0900   LABSPEC 1.021 04/05/2019 0900   PHURINE 5.0 04/05/2019 0900   GLUCOSEU NEGATIVE 04/05/2019 0900   HGBUR SMALL (A) 04/05/2019 0900   BILIRUBINUR NEGATIVE 04/05/2019 0900   BILIRUBINUR N 08/19/2017 1604    KETONESUR 5 (A) 04/05/2019 0900   PROTEINUR 30 (A) 04/05/2019 0900   UROBILINOGEN 0.2 08/19/2017 1604   UROBILINOGEN 0.2 02/01/2014 0350   NITRITE NEGATIVE 04/05/2019 0900   LEUKOCYTESUR MODERATE (A) 04/05/2019 0900    Sepsis Labs: Lactic Acid, Venous No results found for: LATICACIDVEN  MICROBIOLOGY: Recent Results (from the past 240 hour(s))  Urine culture     Status: Abnormal   Collection Time: 04/05/19  8:45 AM   Specimen: Urine, Random  Result Value Ref Range Status   Specimen Description URINE, RANDOM  Final   Special Requests NONE  Final   Culture (A)  Final    <10,000 COLONIES/mL INSIGNIFICANT GROWTH Performed at [facility], [address] [place], [place] [address]    Report Status 04/06/2019 FINAL  Final  SARS Coronavirus 2 [facility](Hospital order, Performed in [facility] hospital lab) Nasopharyngeal Nasopharyngeal Swab     Status: None   Collection Time: 04/05/19  2:45 PM   Specimen: Nasopharyngeal Swab  Result Value Ref Range Status   SARS Coronavirus 2 NEGATIVE NEGATIVE Final    Comment: (NOTE) If result is NEGATIVE SARS-CoV-2 target nucleic acids are NOT DETECTED. The SARS-CoV-2 RNA is generally detectable in upper and lower  respiratory specimens during the acute phase of infection. The lowest  concentration of SARS-CoV-2 viral copies this assay can detect is 250  copies / mL. A negative result does not preclude SARS-CoV-2 infection  and should not be used as the sole basis for treatment or other  patient management decisions.  A negative result may occur with  improper specimen collection / handling, submission of specimen other  than nasopharyngeal swab, presence of viral mutation(s) within the  areas targeted by this assay,  and inadequate number of viral copies  (<250 copies / mL). A negative result must be combined with clinical  observations, patient history, and epidemiological information. If result is POSITIVE SARS-CoV-2 target nucleic acids are  DETECTED. The SARS-CoV-2 RNA is generally detectable in upper and lower  respiratory specimens dur ing the acute phase of infection.  Positive  results are indicative of active infection with SARS-CoV-2.  Clinical  correlation with patient history and other diagnostic information is  necessary to determine patient infection status.  Positive results do  not rule out bacterial infection or co-infection with other viruses. If result is PRESUMPTIVE POSTIVE SARS-CoV-2 nucleic acids MAY BE PRESENT.   A presumptive positive result was obtained on the submitted specimen  and confirmed on repeat testing.  While 2019 novel coronavirus  (SARS-CoV-2) nucleic acids may be present in the submitted sample  additional confirmatory testing may be necessary for epidemiological  and / or clinical management purposes  to differentiate between  SARS-CoV-2 and other Sarbecovirus currently known to infect humans.  If clinically indicated additional testing with an alternate test  methodology [phone]) is advised. The SARS-CoV-2 RNA is generally  detectable in upper and lower respiratory sp ecimens during the acute  phase of infection. The expected result is Negative. Fact Sheet for Patients:  BoilerBrush.com.cy Fact Sheet for Healthcare Providers: https://pope.com/ This test is not yet approved or cleared by the [place] FDA and has been authorized for detection and/or diagnosis of SARS-CoV-2 by FDA under an Emergency Use Authorization (EUA).  This EUA will remain in effect (meaning this test can be used) for the duration of the COVID-19 declaration under Section 564(b)(1) of the Act, 21 U.S.C. section 360bbb-3(b)(1), unless the authorization is terminated or revoked sooner. Performed at [facility], [address] [place], [place] [address]   Aerobic/Anaerobic Culture (surgical/deep wound)     Status: Abnormal   Collection Time: 04/05/19   6:49 PM   Specimen: Abscess  Result Value Ref Range Status   Specimen Description ABSCESS  Final   Special Requests Normal  Final   Gram Stain   Final    FEW WBC PRESENT, PREDOMINANTLY PMN MODERATE GRAM POSITIVE COCCI Performed at [facility], [address] [place], [place] [address]    Culture (A)  Final    MULTIPLE ORGANISMS PRESENT, NONE PREDOMINANT MIXED ANAEROBIC FLORA PRESENT.  CALL LAB IF FURTHER IID REQUIRED.    Report Status 04/09/2019 FINAL  Final    RADIOLOGY STUDIES/RESULTS: Ct Head Wo Contrast  Result Date: 04/06/2019 CLINICAL DATA:  Altered level of consciousness today. EXAM: CT HEAD WITHOUT CONTRAST TECHNIQUE: Contiguous axial images were obtained from the base of the skull through the vertex without intravenous contrast. COMPARISON:  Head CT scan 06/04/2014. FINDINGS: Brain: No evidence of acute infarction, hemorrhage, hydrocephalus, extra-axial collection or mass lesion/mass effect. Chronic microvascular ischemic change has markedly worsened since the prior head CT. Left frontal infarct is remote but new since the prior CT. Vascular: No hyperdense vessel or unexpected calcification. Skull: Intact.  No focal lesion. Sinuses/Orbits: Negative. Other: None. IMPRESSION: No acute abnormality. Extensive chronic microvascular ischemic change. Remote left frontal infarct noted. Electronically Signed   By: Drusilla Kanner M.D.   On: 04/06/2019 13:34   Mr Brain Wo Contrast  Result Date: 04/07/2019 CLINICAL DATA:  Encephalopathy. EXAM: MRI HEAD WITHOUT CONTRAST TECHNIQUE: Multiplanar, multiecho pulse sequences of the brain and surrounding structures were obtained without intravenous contrast. COMPARISON:  Head CT 04/06/2019, brain MRI 02/05/2009  FINDINGS: Brain: Several sequences are motion degraded. There is no evidence of acute infarct. No evidence of intracranial mass. No midline shift or extra-axial fluid collection. Foci of encephalomalacia and chronic hemosiderin  deposition within the left frontal lobe at sites of previous hemorrhagic contusions. Advanced scattered T2/FLAIR hyperintensity within the cerebral white matter has progressed since prior MRI 02/05/2009. However, the previously described large right frontal parietal white matter lesion has not significantly changed. A chronic lacunar infarct within the right thalamus is also new from this prior MRI. Mild generalized parenchymal atrophy. Vascular: Flow voids maintained within the proximal large arterial vessels. Skull and upper cervical spine: Normal marrow signal. Incompletely assessed cervical spondylosis with C4-C5 posterior disc osteophyte complex. Sinuses/Orbits: Imaged globes and orbits demonstrate no acute abnormality. Mild ethmoid sinus mucosal thickening. Trace fluid within right mastoid air cells IMPRESSION: Motion degraded examination. No evidence of acute intracranial abnormality, including acute infarct. Foci of encephalomalacia within the left frontal lobe, sequela of remote hemorrhagic contusions. Advanced signal changes within the white matter have significantly progressed since remote MRI 02/05/2009. Findings are nonspecific but may reflect advanced chronic small vessel ischemic disease. Alternative etiologies such as a demyelinating process cannot be excluded. Chronic right thalamic lacunar infarct, new from prior exam. Electronically Signed   By: Jackey Loge   On: 04/07/2019 14:30   Ct Abdomen Pelvis W Contrast  Result Date: 04/12/2019 CLINICAL DATA:  Status post percutaneous catheter drainage of large sigmoid diverticular abscess on 04/05/2019. EXAM: CT ABDOMEN AND PELVIS WITH CONTRAST TECHNIQUE: Multidetector CT imaging of the abdomen and pelvis was performed using the standard protocol following bolus administration of intravenous contrast. CONTRAST:  80mL OMNIPAQUE IOHEXOL 300 MG/ML  SOLN COMPARISON:  04/05/2019 FINDINGS: Lower chest: No acute abnormality. Hepatobiliary: No focal liver  abnormality is seen. Status post cholecystectomy. No biliary dilatation. Pancreas: Unremarkable. No pancreatic ductal dilatation or surrounding inflammatory changes. Spleen: Normal in size without focal abnormality. Adrenals/Urinary Tract: Adrenal glands are unremarkable. Kidneys are normal, without renal calculi, focal lesion, or hydronephrosis. Bladder is unremarkable. Stomach/Bowel: Status post prior bowel resection. Probable component of mild colonic ileus. No small bowel dilatation. No free intraperitoneal air is identified. Vascular/Lymphatic: No significant vascular findings are present. No enlarged abdominal or pelvic lymph nodes. Reproductive: Uterus and bilateral adnexa are unremarkable. Other: At the level of the left lower quadrant percutaneous drainage catheter, there is no further liquefied abscess identified by CT. The abscess cavity is completely decompressed. The drainage catheter lies along the inferior aspect of the distal sigmoid colon with numerous diverticula again visualized. Musculoskeletal: No acute or significant osseous findings. IMPRESSION: 1. Complete evacuation of sigmoid diverticular abscess after percutaneous catheter drainage. The left lower quadrant drainage catheter lies along the inferior aspect of the distal sigmoid colon. 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 2. Probable component of mild colonic ileus. No small bowel obstruction or evidence of bowel perforation. Electronically Signed   By: Irish Lack M.D.   On: 04/12/2019 09:59   Ct Abdomen Pelvis W Contrast  Result Date: 04/05/2019 CLINICAL DATA:  68 year old female with abdominal pain. History of prior perforated duodenal ulcer and exclude Terri laparotomy and pyloroplasty. EXAM: CT ABDOMEN AND PELVIS WITH CONTRAST TECHNIQUE: Multidetector CT imaging of the abdomen and pelvis was performed  using the standard protocol following bolus administration of intravenous contrast. CONTRAST:  OMNIPAQUE IOHEXOL 300 MG/ML  SOLN COMPARISON:  CT of the abdomen  pelvis dated 08/05/2011. FINDINGS: Evaluation of this exam is limited due to respiratory motion artifact. Lower chest: The visualized lung bases are clear. Minimal right middle lobe atelectasis/scarring. There is mild cardiomegaly. Coronary vascular calcifications noted. No definite intra-abdominal free air or free fluid. Hepatobiliary: A 1 cm hypodense lesion in the dome of the liver is not well characterized but appears similar to prior CT, likely a cyst. The liver is otherwise unremarkable. There is mild intrahepatic biliary ductal dilatation. No calcified gallstone or pericholecystic fluid. Pancreas: Unremarkable. No pancreatic ductal dilatation or surrounding inflammatory changes. Spleen: Normal in size without focal abnormality. Adrenals/Urinary Tract: The adrenal glands are unremarkable. There is no hydronephrosis on either side. There is symmetric enhancement and excretion of contrast by both kidneys. Subcentimeter left renal hypodense focus is too small to characterize but likely represents a cyst. The urinary bladder is unremarkable. Stomach/Bowel: Extensive sigmoid diverticulosis with active inflammatory changes. There is a 9.7 x 3.7 x 3.8 cm diverticular abscess along the sigmoid colon. Postsurgical changes of gastrojejunostomy. No evidence of bowel obstruction. A fluid containing structure with pockets of air in the upper abdomen anteriorly (series 3, image 27) appears similar to the CT of 2013 and may represent the duodenal bulb. A 15 mm high attenuating structure within this may represent a chronic stone although an enhancing lesion is not entirely excluded. Vascular/Lymphatic: Moderate aortoiliac atherosclerotic disease. The IVC is unremarkable. No portal venous gas. There is no adenopathy. Reproductive: The uterus is grossly  unremarkable. There is loss of fat plane between the superior uterus and diverticular abscess of the sigmoid colon. No adnexal masses. Other: None Musculoskeletal: Osteopenia. No acute osseous pathology. IMPRESSION: 1. Sigmoid diverticulitis with a large diverticular abscess. There is loss of fat plane between the superior uterus and diverticular abscess. 2. A 15 mm high attenuating structure in the anterior upper abdomen appears similar to prior CT and may represent the duodenal bulb. Aortic Atherosclerosis (ICD10-I70.0). Electronically Signed   By: Elgie Collard M.D.   On: 04/05/2019 14:14   Ir Sinus/fist Tube Chk-non Gi  Result Date: 04/12/2019 CLINICAL DATA:  History of diverticular abscess, post percutaneous drainage catheter placement 04/05/2019; subsequent CT scan the abdomen and pelvis performed 04/12/2019 demonstrates near complete resolution of the pericolonic diverticular abscess. Patient reports very little output from the percutaneous drainage catheter and as such presents today for drainage catheter injection. EXAM: SINUS TRACT INJECTION/FISTULOGRAM COMPARISON:  CT-guided percutaneous drainage catheter placement-04/05/2019; CT abdomen and pelvis-04/05/2019; 04/12/2019 CONTRAST:  10mL OMNIPAQUE IOHEXOL 300 MG/ML  SOLN FLUOROSCOPY TIME:  30 seconds (2 mGy) TECHNIQUE: The patient was positioned supine on the fluoroscopy table. A preprocedural spot fluoroscopic image was obtained of the left lower abdomen/pelvis and existing percutaneous drainage catheter Multiple spot fluoroscopic and radiographic images were obtained following the injection of a small amount of contrast via the existing percutaneous drainage catheter. Images reviewed and the decision was made to maintain the percutaneous drainage catheter. As such, the drainage catheter was flushed with a small of saline and converted from a JP bulb to a gravity bag. A dressing was applied. The patient tolerated the procedure well without  immediate postprocedural complication. FINDINGS: Preprocedural spot fluoroscopic image demonstrates unchanged positioning pre-existing percutaneous drainage catheter with end coiled and locked over the left lower abdomen/pelvis. A small amount of excreted contrast from recent CT scan is seen within the urinary bladder Contrast injection demonstrates opacification of decompressed abscess cavity with fistulous connection between the residual collection in the adjacent sigmoid colon. IMPRESSION: Contrast injection  is positive for fistulous connection between the decompressed abscess cavity and the adjacent sigmoid colon. PLAN: - The patient's percutaneous drainage catheter was converted from a JP bulb to a gravity bag. - The patient was instructed to no longer flush the percutaneous drainage catheter however to maintain diligent records with getting daily drainage catheter output - As the patient is impending discharge, she will present to the interventional radiology drain clinic in 2 weeks for drainage catheter evaluation and management and repeat drainage catheter injection. - note, CT imaging is not required (unless patient has recurrence of abdominal pain or fever), as CT scan performed 04/12/2019 demonstrated resolution of the pericolonic diverticular abscess. Electronically Signed   By: Simonne Come M.D.   On: 04/12/2019 15:42   Ct Image Guided Drainage By Percutaneous Catheter  Result Date: 04/06/2019 INDICATION: 68 year old female with sigmoid colonic diverticulitis and large intramural abscess. She presents for CT-guided drain placement. EXAM: CT-guided drain placement MEDICATIONS: The patient is currently admitted to the hospital and receiving intravenous antibiotics. The antibiotics were administered within an appropriate time frame prior to the initiation of the procedure. ANESTHESIA/SEDATION: Fentanyl 100 mcg IV; Versed 2 mg IV Moderate Sedation Time:  17 minutes The patient was continuously monitored  during the procedure by the interventional radiology nurse under my direct supervision. COMPLICATIONS: None immediate. PROCEDURE: Informed written consent was obtained from the patient after a thorough discussion of the procedural risks, benefits and alternatives. All questions were addressed. Maximal Sterile Barrier Technique was utilized including caps, mask, sterile gowns, sterile gloves, sterile drape, hand hygiene and skin antiseptic. A timeout was performed prior to the initiation of the procedure. A planning axial CT scan was performed. The fluid and gas collection in the left lower quadrant of the anatomic pelvis position between the sigmoid colon, uterus and bladder was successfully identified. A suitable skin entry site was selected and marked. Care was taken to ensure that the entry site was below the redundant portion of the sigmoid colon. After sterile prep and drape in the standard fashion with chlorhexidine, local anesthesia was attained by infiltration with 1% lidocaine. A small dermatotomy was made. Under intermittent CT guidance, an 18 gauge trocar needle was advanced into the fluid and gas collection. A 0.035 wire was then advanced into the fluid and gas collection. The needle was exchanged for a fascial dilator and the soft tissue tract was dilated to 12 [place]. A Cook 12 [place] all-purpose drainage catheter modified with additional sideholes was then advanced over the wire and formed. Aspiration yields approximately 80 cc of thick purulent material. A sample was sent for Gram stain and culture. The abscess cavity was lavaged with sterile saline and the drain connected to JP bulb suction. The drain was then secured to the skin with 0 Prolene suture and an adhesive fixation device. The patient tolerated the procedure well. IMPRESSION: Successful placement of a 12 French drainage catheter modified with additional sideholes. Aspiration yields 80 mL of frankly purulent and foul-smelling fluid.  Samples were sent for Gram stain and culture. PLAN: Maintain drain to JP bulb suction. Flush drainage catheter at least once per shift. When drain output has decreased significantly, recommend follow-up with both contrast-enhanced CT scan of the pelvis to assess for resolution of the abscess as well as drain injection to assess for fistula with the sigmoid colon given the intramural location of the abscess. Signed, Sterling Big, MD, RPVI [facility] Electronically Signed   By: Malachy Moan  M.D.   On: 04/06/2019 12:42   [place] Ekg Site Rite  Result Date: 04/08/2019 If Site Rite image not attached, placement could not be confirmed due to current cardiac rhythm.    LOS: 8 days   Jeoffrey Massed, MD  [facility]  If 7PM-7AM, please contact night-coverage  Please page via [email or web address]  Go to [email or web address] and use [place]'s universal password to access. If you do not have the password, please contact the hospital operator.  Locate the [facility] provider you are looking for under [facility] and page to a number that you can be directly reached. If you still have difficulty reaching the provider, please page the [facility] (Director on Call) for the Hospitalists listed on amion for assistance.  04/13/2019, 11:58 AM

## 2019-04-14 ENCOUNTER — Other Ambulatory Visit (HOSPITAL_COMMUNITY): Payer: Self-pay | Admitting: Student

## 2019-04-14 ENCOUNTER — Other Ambulatory Visit: Payer: Self-pay | Admitting: General Surgery

## 2019-04-14 DIAGNOSIS — K651 Peritoneal abscess: Secondary | ICD-10-CM

## 2019-04-14 LAB — COMPREHENSIVE METABOLIC PANEL
ALT: 21 U/L (ref 0–44)
AST: 25 U/L (ref 15–41)
Albumin: 2.8 g/dL — ABNORMAL LOW (ref 3.5–5.0)
Alkaline Phosphatase: 73 U/L (ref 38–126)
Anion gap: 10 (ref 5–15)
BUN: 12 mg/dL (ref 8–23)
CO2: 28 mmol/L (ref 22–32)
Calcium: 8.6 mg/dL — ABNORMAL LOW (ref 8.9–10.3)
Chloride: 91 mmol/L — ABNORMAL LOW (ref 98–111)
Creatinine, Ser: 0.45 mg/dL (ref 0.44–1.00)
GFR calc Af Amer: >60 mL/min (ref >60)
GFR calc non Af Amer: >60 mL/min (ref >60)
Glucose, Bld: 106 mg/dL — ABNORMAL HIGH (ref 70–99)
Potassium: 4.5 mmol/L (ref 3.5–5.1)
Sodium: 129 mmol/L — ABNORMAL LOW (ref 135–145)
Total Bilirubin: 0.4 mg/dL (ref 0.3–1.2)
Total Protein: 5.4 g/dL — ABNORMAL LOW (ref 6.5–8.1)

## 2019-04-14 LAB — PHOSPHORUS: Phosphorus: 4.2 mg/dL (ref 2.5–4.6)

## 2019-04-14 LAB — MAGNESIUM: Magnesium: 2.2 mg/dL (ref 1.7–2.4)

## 2019-04-14 MED ORDER — AMLODIPINE BESYLATE 10 MG PO TABS: 10.0000 mg | ORAL_TABLET | Freq: Every day | ORAL | 0 refills | Status: DC

## 2019-04-14 MED ORDER — CEFDINIR 300 MG PO CAPS: 300.0000 mg | ORAL_CAPSULE | Freq: Two times a day (BID) | ORAL | 0 refills | Status: AC

## 2019-04-14 MED ORDER — ASPIRIN 81 MG PO CHEW: 81.0000 mg | CHEWABLE_TABLET | Freq: Every day | ORAL | Status: DC

## 2019-04-14 MED ORDER — ENSURE ENLIVE PO LIQD: 237.0000 mL | Freq: Three times a day (TID) | ORAL | 0 refills | Status: AC

## 2019-04-14 MED ORDER — METRONIDAZOLE 500 MG PO TABS: 500.0000 mg | ORAL_TABLET | Freq: Three times a day (TID) | ORAL | 0 refills | Status: AC

## 2019-04-14 MED ORDER — THIAMINE HCL 100 MG PO TABS: 100.0000 mg | ORAL_TABLET | Freq: Every day | ORAL | 0 refills | Status: DC

## 2019-04-14 MED ORDER — PANTOPRAZOLE SODIUM 40 MG PO TBEC: 40.0000 mg | DELAYED_RELEASE_TABLET | Freq: Every day | ORAL | 0 refills | Status: DC

## 2019-04-14 MED ORDER — FOLIC ACID 1 MG PO TABS: 1.0000 mg | ORAL_TABLET | Freq: Every day | ORAL | 0 refills | Status: DC

## 2019-04-14 NOTE — Progress Notes (Signed)
Patient discharged to home. Verbalized understanding of all discharge instructions including drain care, discharge medications and follow up MD visits.  

## 2019-04-14 NOTE — Progress Notes (Signed)
Referring Physician(s): Newman,D  Supervising Physician: Sandi Mariscal  Patient Status:  [facility] - In-pt  Chief Complaint: Abdominal pain/abscess   Subjective: Pt doing ok today; asking about going home; denies worsening abd pain,N/V   Allergies: Sulfa antibiotics, Sulfonamide derivatives, Penicillins, and Hydromorphone hcl  Medications: Prior to Admission medications   Medication Sig Start Date End Date Taking? Authorizing Provider  acetaminophen (TYLENOL) 500 MG tablet Take 500 mg by mouth every 6 (six) hours as needed for mild pain.    Yes [provider]     Vital Signs: BP (!) 126/91 (BP Location: Left Arm)    Pulse 70    Temp 98.4 F (36.9 C) (Oral)    Resp 16    Ht 5\' 3"  (1.6 m)    Wt 94 lb 2.2 oz (42.7 kg)    SpO2 96%    BMI 16.68 kg/m   Physical Exam awake, conversant; LLQ drain intact, output feculent/yellow; insertion site mildly tender  Imaging: Ct Abdomen Pelvis W Contrast  Result Date: 04/12/2019 CLINICAL DATA:  Status post percutaneous catheter drainage of large sigmoid diverticular abscess on 04/05/2019. EXAM: CT ABDOMEN AND PELVIS WITH CONTRAST TECHNIQUE: Multidetector CT imaging of the abdomen and pelvis was performed using the standard protocol following bolus administration of intravenous contrast. CONTRAST:  13mL OMNIPAQUE IOHEXOL 300 MG/ML  SOLN COMPARISON:  04/05/2019 FINDINGS: Lower chest: No acute abnormality. Hepatobiliary: No focal liver abnormality is seen. Status post cholecystectomy. No biliary dilatation. Pancreas: Unremarkable. No pancreatic ductal dilatation or surrounding inflammatory changes. Spleen: Normal in size without focal abnormality. Adrenals/Urinary Tract: Adrenal glands are unremarkable. Kidneys are normal, without renal calculi, focal lesion, or hydronephrosis. Bladder is unremarkable. Stomach/Bowel: Status post prior bowel resection. Probable component of mild colonic ileus. No small bowel dilatation. No free intraperitoneal  air is identified. Vascular/Lymphatic: No significant vascular findings are present. No enlarged abdominal or pelvic lymph nodes. Reproductive: Uterus and bilateral adnexa are unremarkable. Other: At the level of the left lower quadrant percutaneous drainage catheter, there is no further liquefied abscess identified by CT. The abscess cavity is completely decompressed. The drainage catheter lies along the inferior aspect of the distal sigmoid colon with numerous diverticula again visualized. Musculoskeletal: No acute or significant osseous findings. IMPRESSION: 1. Complete evacuation of sigmoid diverticular abscess after percutaneous catheter drainage. The left lower quadrant drainage catheter lies along the inferior aspect of the distal sigmoid colon. 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 2. Probable component of mild colonic ileus. No small bowel obstruction or evidence of bowel perforation. Electronically Signed   By: Aletta Edouard M.D.   On: 04/12/2019 09:59   Ir Sinus/fist Tube Chk-non Gi  Result Date: 04/12/2019 CLINICAL DATA:  History of diverticular abscess, post percutaneous drainage catheter placement 04/05/2019; subsequent CT scan the abdomen and pelvis performed 04/12/2019 demonstrates near complete resolution of the pericolonic diverticular abscess. Patient reports very little output from the percutaneous drainage catheter and as such presents today for drainage catheter injection. EXAM: SINUS TRACT INJECTION/FISTULOGRAM COMPARISON:  CT-guided percutaneous drainage catheter placement-04/05/2019; CT abdomen and pelvis-04/05/2019; 04/12/2019 CONTRAST:  70mL OMNIPAQUE IOHEXOL 300 MG/ML  SOLN FLUOROSCOPY TIME:  30 seconds (2 mGy) TECHNIQUE: The patient was positioned supine on the fluoroscopy table. A preprocedural spot fluoroscopic image was obtained of the left lower  abdomen/pelvis and existing percutaneous drainage catheter Multiple spot fluoroscopic and radiographic images were obtained following the injection of a  small amount of contrast via the existing percutaneous drainage catheter. Images reviewed and the decision was made to maintain the percutaneous drainage catheter. As such, the drainage catheter was flushed with a small of saline and converted from a JP bulb to a gravity bag. A dressing was applied. The patient tolerated the procedure well without immediate postprocedural complication. FINDINGS: Preprocedural spot fluoroscopic image demonstrates unchanged positioning pre-existing percutaneous drainage catheter with end coiled and locked over the left lower abdomen/pelvis. A small amount of excreted contrast from recent CT scan is seen within the urinary bladder Contrast injection demonstrates opacification of decompressed abscess cavity with fistulous connection between the residual collection in the adjacent sigmoid colon. 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 PLAN: - The patient's percutaneous drainage catheter was converted from a JP bulb to a gravity bag. - The patient was instructed to no longer flush the percutaneous drainage catheter however to maintain diligent records with getting daily drainage catheter output - As the patient is impending discharge, she will present to the interventional radiology drain clinic in 2 weeks for drainage catheter evaluation and management and repeat drainage catheter injection. - note, CT imaging is not required (unless patient has recurrence of abdominal pain or fever), as CT scan performed 04/12/2019 demonstrated resolution of the pericolonic diverticular abscess. Electronically Signed   By: Simonne Come M.D.   On: 04/12/2019 15:42    Labs:  CBC: Recent Labs    04/08/19 0219 04/09/19 0452 04/10/19 0423  04/11/19 0428  WBC 8.1 11.9* 9.9 8.2  HGB 14.0 13.1 13.0 12.0  HCT 37.8 36.1 36.9 34.2*  PLT 379 375 391 387    COAGS: Recent Labs    04/06/19 0404  INR 1.2  APTT 40*    BMP: Recent Labs    04/10/19 0423 04/11/19 0428 04/12/19 0359 04/14/19 0321  NA 130* 130* 134* 129*  K 3.9 3.7 4.1 4.5  CL 99 96* 101 91*  CO2 25 26 26 28   GLUCOSE 130* 122* 99 106*  BUN 6* 9 10 12   CALCIUM 7.9* 8.0* 8.4* 8.6*  CREATININE 0.38* 0.35* 0.51 0.45  GFRNONAA >60 >60 >60 >60  GFRAA >60 >60 >60 >60    LIVER FUNCTION TESTS: Recent Labs    04/09/19 0452 04/10/19 0423 04/11/19 0428 04/14/19 0321  BILITOT 0.3 0.2* <0.1* 0.4  AST 21 26 37 25  ALT 15 16 22 21   ALKPHOS 68 69 64 73  PROT 5.5* 5.9* 5.2* 5.4*  ALBUMIN 2.6* 2.6* 2.6* 2.8*    Assessment and Plan: Pt with hx diverticular abscess, s/p drainage 9/15; 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 04/16/19, PA-C 04/14/2019, 11:22 AM   I spent a total of 15 minutes at the the patient's bedside AND on the patient's hospital floor or unit, greater than 50% of which was counseling/coordinating care for left abdominal abscess drain    Patient ID: [id number], female   DOB: 1951/06/26, 69 y.o.   MRN: [name]

## 2019-04-14 NOTE — Discharge Summary (Signed)
PATIENT DETAILS Name: [name] Age: 68 y.o. Sex: female Date of Birth: Dec 22, 1950 MRN: [id number]. Admitting Physician: Clydie Braun, MD WUJ:[id number], Janett Labella, MD  Admit Date: 04/04/2019 Discharge date: 04/14/2019  Recommendations for Outpatient Follow-up:  1. Follow up with PCP in 1-2 weeks 2. Please obtain BMP/CBC in one week 3. Please ensure follow-up with IR drain clinic and general surgery  Admitted From:  Home  Disposition: Home with home health services   Home Health: Yes  Equipment/Devices: 9/15>> left lower quadrant JP drain placement  Discharge Condition: Stable  CODE STATUS: FULL CODE  Diet recommendation:  Diet Order            Diet - low sodium heart healthy        DIET SOFT Room service appropriate? Yes; Fluid consistency: Thin  Diet effective now               Brief Summary: See H&P, Labs, Consult and Test reports for all details in brief, Patient is a 68 y.o. female with history of HTN, anxiety, PUD with duodenal ulcer perforation s/p surgical repair, alcoholism who presented to the hospital with abdominal pain, worsening confusion-was found to have acute metabolic encephalopathy, diverticular abscess.  See below for further details.  Brief Hospital Course: Diverticular abscess with fistula formation: Clinically improved no further abdominal pain. Seen by CCS/IR-underwent left lower quadrant JP drain placement on 9/21.  CT scan on 9/22 showed resolution of the abscess but upon further evaluation by IR-appears to have a fistula formation.  Was on Inetta Fermo during this hospital stay-but has tolerated advancement in diet.  Plans are to transition from Rocephin/Flagyl to 2 weeks of Augmentin.  Please ensure follow-up with IR drain clinic in [place] surgery.  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  MRI brain without any acute abnormalities.  B12, TSH and RPR  stable.  Hyponatremia: Improved following fluid restriction and 1 dose of Samsca  History of malabsorption: Iron levels borderline low-we will replete with IV iron supplementation today (per family request).  Further monitoring can be done in the outpatient setting.  Alcohol abuse: No signs of DTs-treated with CIWA protocol.  Counseled to quit  Nutrition Problem: Nutrition Problem: Inadequate oral intake Etiology: altered GI function Signs/Symptoms: meal completion < 25% Interventions: Ensure Enlive (each supplement provides 350kcal and 20 grams of protein), MVI   Procedures/Studies: 9/15>> left lower quadrant JP drain placement 9/22>> sinus tract injection/fistulogram  Discharge Diagnoses:  Principal Problem:   Diverticulitis of large intestine with abscess Active Problems:   Anxiety state   Essential hypertension   Hyponatremia   Dementia (HCC)   Discharge Instructions:  Activity:  As tolerated   Discharge Instructions    Call MD for:  persistant nausea and vomiting   Complete by: As directed    Call MD for:  severe uncontrolled pain   Complete by: As directed    Call MD for:  temperature >100.4   Complete by: As directed    Diet - low sodium heart healthy   Complete by: As directed    Discharge instructions   Complete by: As directed    Cover drain site for showering.  Do not submerge drain in bathtub or water. Replace gauze and tape as needed to keep skin clean and dry.  Do not flush drain.  Allow to drain to gravity. Keep a record of output from the drain daily and bring log with  you to your appointment with Interventional Radiology.  Schedulers will contact you with date and time of follow-up appointment with Interventional Radiology; expected in 2 weeks.   Discharge instructions   Complete by: As directed    Follow with Primary MD  Marletta Lor, MD in 1-2 weeks  Please follow-up with general surgery, and interventional radiology drain clinic as  instructed  Please get a complete blood count and chemistry panel checked by your Primary MD at your next visit, and again as instructed by your Primary MD.  Get Medicines reviewed and adjusted: Please take all your medications with you for your next visit with your Primary MD  Laboratory/radiological data: Please request your Primary MD to go over all hospital tests and procedure/radiological results at the follow up, please ask your Primary MD to get all Hospital records sent to his/her office.  In some cases, they will be blood work, cultures and biopsy results pending at the time of your discharge. Please request that your primary care M.D. follows up on these results.  Also Note the following: If you experience worsening of your admission symptoms, develop shortness of breath, life threatening emergency, suicidal or homicidal thoughts you must seek medical attention immediately by calling 911 or calling your MD immediately  if symptoms less severe.  You must read complete instructions/literature along with all the possible adverse reactions/side effects for all the Medicines you take and that have been prescribed to you. Take any new Medicines after you have completely understood and accpet all the possible adverse reactions/side effects.   Do not drive when taking Pain medications or sleeping medications (Benzodaizepines)  Do not take more than prescribed Pain, Sleep and Anxiety Medications. It is not advisable to combine anxiety,sleep and pain medications without talking with your primary care practitioner  Special Instructions: If you have smoked or chewed Tobacco  in the last 2 yrs please stop smoking, stop any regular Alcohol  and or any Recreational drug use.  Wear Seat belts while driving.  Please note: You were cared for by a hospitalist during your hospital stay. Once you are discharged, your primary care physician will handle any further medical issues. Please note that NO  REFILLS for any discharge medications will be authorized once you are discharged, as it is imperative that you return to your primary care physician (or establish a relationship with a primary care physician if you do not have one) for your post hospital discharge needs so that they can reassess your need for medications and monitor your lab values.   Increase activity slowly   Complete by: As directed      Allergies as of 04/14/2019      Reactions   Sulfa Antibiotics Anaphylaxis, Rash   Sulfonamide Derivatives Anaphylaxis, Rash   Penicillins Other (See Comments)   Has patient had a PCN reaction causing immediate rash, facial/tongue/throat swelling, SOB or lightheadedness with hypotension: No Has patient had a PCN reaction causing severe rash involving mucus membranes or skin necrosis: No Has patient had a PCN reaction that required hospitalization No Has patient had a PCN reaction occurring within the last 10 years: No If all of the above answers are "NO", then may proceed with Cephalosporin use.   Hydromorphone Hcl Rash      Medication List    TAKE these medications   acetaminophen 500 MG tablet Commonly known as: TYLENOL Take 500 mg by mouth every 6 (six) hours as needed for mild pain.   amLODipine 10 MG  tablet Commonly known as: NORVASC Take 1 tablet (10 mg total) by mouth daily. Start taking on: April 15, 2019   aspirin 81 MG chewable tablet Chew 1 tablet (81 mg total) by mouth daily. Start taking on: April 15, 2019   cefdinir 300 MG capsule Commonly known as: OMNICEF Take 1 capsule (300 mg total) by mouth 2 (two) times daily for 14 days.   feeding supplement (ENSURE ENLIVE) Liqd Take 237 mLs by mouth 3 (three) times daily between meals.   folic acid 1 MG tablet Commonly known as: FOLVITE Take 1 tablet (1 mg total) by mouth daily. Start taking on: April 15, 2019   metroNIDAZOLE 500 MG tablet Commonly known as: Flagyl Take 1 tablet (500 mg total) by  mouth 3 (three) times daily for 14 days.   pantoprazole 40 MG tablet Commonly known as: PROTONIX Take 1 tablet (40 mg total) by mouth daily.   thiamine 100 MG tablet Take 1 tablet (100 mg total) by mouth daily. Start taking on: April 15, 2019      Follow-up Information    Gordy Savers, MD In 2 days.   Specialty: Internal Medicine Contact information: [address] [place] [address] [phone]        [facility] EMERGENCY DEPARTMENT.   Specialty: Emergency Medicine Why: If symptoms worsen Contact information: [address] [id number] mc [place] [address] [phone]       [name], MD. Nyra Capes on 05/09/2019.   Specialty: General Surgery Why: Follow up appointment scheduled for 10:30 AM. Please arrive 30 min prior to appointment time. Bring photo ID and insurance information.  Contact information: [address] [place] [address] [phone]        Malachy Moan, MD Follow up.   Specialties: Interventional Radiology, Radiology Why: Schedulers will call with date and time of appointment. Contact information: [address] [place] [address] [phone]          Allergies  Allergen Reactions   Sulfa Antibiotics Anaphylaxis and Rash   Sulfonamide Derivatives Anaphylaxis and Rash   Penicillins Other (See Comments)    Has patient had a PCN reaction causing immediate rash, facial/tongue/throat swelling, SOB or lightheadedness with hypotension: No Has patient had a PCN reaction causing severe rash involving mucus membranes or skin necrosis: No Has patient had a PCN reaction that required hospitalization No Has patient had a PCN reaction occurring within the last 10 years: No If all of the above answers are "NO", then may proceed with Cephalosporin use.   Hydromorphone Hcl Rash    Consultations:   general surgery and IR   Other  Procedures/Studies: Ct Head Wo Contrast  Result Date: 04/06/2019 CLINICAL DATA:  Altered level of consciousness today. EXAM: CT HEAD WITHOUT CONTRAST TECHNIQUE: Contiguous axial images were obtained from the base of the skull through the vertex without intravenous contrast. COMPARISON:  Head CT scan 06/04/2014. FINDINGS: Brain: No evidence of acute infarction, hemorrhage, hydrocephalus, extra-axial collection or mass lesion/mass effect. Chronic microvascular ischemic change has markedly worsened since the prior head CT. Left frontal infarct is remote but new since the prior CT. Vascular: No hyperdense vessel or unexpected calcification. Skull: Intact.  No focal lesion. Sinuses/Orbits: Negative. Other: None. IMPRESSION: No acute abnormality. Extensive chronic microvascular ischemic change. Remote left frontal infarct noted. Electronically Signed   By: Drusilla Kanner M.D.   On: 04/06/2019 13:34   Mr Brain Wo Contrast  Result Date: 04/07/2019 CLINICAL  DATA:  Encephalopathy. EXAM: MRI HEAD WITHOUT CONTRAST TECHNIQUE: Multiplanar, multiecho pulse sequences of the brain and surrounding structures were obtained without intravenous contrast. COMPARISON:  Head CT 04/06/2019, brain MRI 02/05/2009 FINDINGS: Brain: Several sequences are motion degraded. There is no evidence of acute infarct. No evidence of intracranial mass. No midline shift or extra-axial fluid collection. Foci of encephalomalacia and chronic hemosiderin deposition within the left frontal lobe at sites of previous hemorrhagic contusions. Advanced scattered T2/FLAIR hyperintensity within the cerebral white matter has progressed since prior MRI 02/05/2009. However, the previously described large right frontal parietal white matter lesion has not significantly changed. A chronic lacunar infarct within the right thalamus is also new from this prior MRI. Mild generalized parenchymal atrophy. Vascular: Flow voids maintained within the proximal large  arterial vessels. Skull and upper cervical spine: Normal marrow signal. Incompletely assessed cervical spondylosis with C4-C5 posterior disc osteophyte complex. Sinuses/Orbits: Imaged globes and orbits demonstrate no acute abnormality. Mild ethmoid sinus mucosal thickening. Trace fluid within right mastoid air cells IMPRESSION: Motion degraded examination. No evidence of acute intracranial abnormality, including acute infarct. Foci of encephalomalacia within the left frontal lobe, sequela of remote hemorrhagic contusions. Advanced signal changes within the white matter have significantly progressed since remote MRI 02/05/2009. Findings are nonspecific but may reflect advanced chronic small vessel ischemic disease. Alternative etiologies such as a demyelinating process cannot be excluded. Chronic right thalamic lacunar infarct, new from prior exam. Electronically Signed   By: Jackey Loge   On: 04/07/2019 14:30   Ct Abdomen Pelvis W Contrast  Result Date: 04/12/2019 CLINICAL DATA:  Status post percutaneous catheter drainage of large sigmoid diverticular abscess on 04/05/2019. EXAM: CT ABDOMEN AND PELVIS WITH CONTRAST TECHNIQUE: Multidetector CT imaging of the abdomen and pelvis was performed using the standard protocol following bolus administration of intravenous contrast. CONTRAST:  80mL OMNIPAQUE IOHEXOL 300 MG/ML  SOLN COMPARISON:  04/05/2019 FINDINGS: Lower chest: No acute abnormality. Hepatobiliary: No focal liver abnormality is seen. Status post cholecystectomy. No biliary dilatation. Pancreas: Unremarkable. No pancreatic ductal dilatation or surrounding inflammatory changes. Spleen: Normal in size without focal abnormality. Adrenals/Urinary Tract: Adrenal glands are unremarkable. Kidneys are normal, without renal calculi, focal lesion, or hydronephrosis. Bladder is unremarkable. Stomach/Bowel: Status post prior bowel resection. Probable component of mild colonic ileus. No small bowel dilatation. No free  intraperitoneal air is identified. Vascular/Lymphatic: No significant vascular findings are present. No enlarged abdominal or pelvic lymph nodes. Reproductive: Uterus and bilateral adnexa are unremarkable. Other: At the level of the left lower quadrant percutaneous drainage catheter, there is no further liquefied abscess identified by CT. The abscess cavity is completely decompressed. The drainage catheter lies along the inferior aspect of the distal sigmoid colon with numerous diverticula again visualized. Musculoskeletal: No acute or significant osseous findings. IMPRESSION: 1. Complete evacuation of sigmoid diverticular abscess after percutaneous catheter drainage. The left lower quadrant drainage catheter lies along the inferior aspect of the distal sigmoid colon. 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 2. Probable component of mild colonic ileus. No small bowel obstruction or evidence of bowel perforation. Electronically Signed   By: Irish Lack M.D.   On: 04/12/2019 09:59   Ct Abdomen Pelvis W Contrast  Result Date: 04/05/2019 CLINICAL DATA:  68 year old female with abdominal pain. History of prior perforated duodenal ulcer and exclude Terri laparotomy and pyloroplasty. EXAM: CT ABDOMEN AND PELVIS WITH CONTRAST TECHNIQUE: Multidetector CT imaging of  the abdomen and pelvis was performed using the standard protocol following bolus administration of intravenous contrast. CONTRAST:  OMNIPAQUE IOHEXOL 300 MG/ML  SOLN COMPARISON:  CT of the abdomen pelvis dated 08/05/2011. FINDINGS: Evaluation of this exam is limited due to respiratory motion artifact. Lower chest: The visualized lung bases are clear. Minimal right middle lobe atelectasis/scarring. There is mild cardiomegaly. Coronary vascular calcifications noted. No definite intra-abdominal free air or free fluid.  Hepatobiliary: A 1 cm hypodense lesion in the dome of the liver is not well characterized but appears similar to prior CT, likely a cyst. The liver is otherwise unremarkable. There is mild intrahepatic biliary ductal dilatation. No calcified gallstone or pericholecystic fluid. Pancreas: Unremarkable. No pancreatic ductal dilatation or surrounding inflammatory changes. Spleen: Normal in size without focal abnormality. Adrenals/Urinary Tract: The adrenal glands are unremarkable. There is no hydronephrosis on either side. There is symmetric enhancement and excretion of contrast by both kidneys. Subcentimeter left renal hypodense focus is too small to characterize but likely represents a cyst. The urinary bladder is unremarkable. Stomach/Bowel: Extensive sigmoid diverticulosis with active inflammatory changes. There is a 9.7 x 3.7 x 3.8 cm diverticular abscess along the sigmoid colon. Postsurgical changes of gastrojejunostomy. No evidence of bowel obstruction. A fluid containing structure with pockets of air in the upper abdomen anteriorly (series 3, image 27) appears similar to the CT of 2013 and may represent the duodenal bulb. A 15 mm high attenuating structure within this may represent a chronic stone although an enhancing lesion is not entirely excluded. Vascular/Lymphatic: Moderate aortoiliac atherosclerotic disease. The IVC is unremarkable. No portal venous gas. There is no adenopathy. Reproductive: The uterus is grossly unremarkable. There is loss of fat plane between the superior uterus and diverticular abscess of the sigmoid colon. No adnexal masses. Other: None Musculoskeletal: Osteopenia. No acute osseous pathology. IMPRESSION: 1. Sigmoid diverticulitis with a large diverticular abscess. There is loss of fat plane between the superior uterus and diverticular abscess. 2. A 15 mm high attenuating structure in the anterior upper abdomen appears similar to prior CT and may represent the duodenal bulb. Aortic  Atherosclerosis (ICD10-I70.0). Electronically Signed   By: Elgie Collard M.D.   On: 04/05/2019 14:14   Ir Sinus/fist Tube Chk-non Gi  Result Date: 04/12/2019 CLINICAL DATA:  History of diverticular abscess, post percutaneous drainage catheter placement 04/05/2019; subsequent CT scan the abdomen and pelvis performed 04/12/2019 demonstrates near complete resolution of the pericolonic diverticular abscess. Patient reports very little output from the percutaneous drainage catheter and as such presents today for drainage catheter injection. EXAM: SINUS TRACT INJECTION/FISTULOGRAM COMPARISON:  CT-guided percutaneous drainage catheter placement-04/05/2019; CT abdomen and pelvis-04/05/2019; 04/12/2019 CONTRAST:  10mL OMNIPAQUE IOHEXOL 300 MG/ML  SOLN FLUOROSCOPY TIME:  30 seconds (2 mGy) TECHNIQUE: The patient was positioned supine on the fluoroscopy table. A preprocedural spot fluoroscopic image was obtained of the left lower abdomen/pelvis and existing percutaneous drainage catheter Multiple spot fluoroscopic and radiographic images were obtained following the injection of a small amount of contrast via the existing percutaneous drainage catheter. Images reviewed and the decision was made to maintain the percutaneous drainage catheter. As such, the drainage catheter was flushed with a small of saline and converted from a JP bulb to a gravity bag. A dressing was applied. The patient tolerated the procedure well without immediate postprocedural complication. FINDINGS: Preprocedural spot fluoroscopic image demonstrates unchanged positioning pre-existing percutaneous drainage catheter with end coiled and locked over the left lower abdomen/pelvis. A small amount of excreted contrast from recent  CT scan is seen within the urinary bladder Contrast injection demonstrates opacification of decompressed abscess cavity with fistulous connection between the residual collection in the adjacent sigmoid colon. IMPRESSION: Contrast  injection is positive for fistulous connection between the decompressed abscess cavity and the adjacent sigmoid colon. PLAN: - The patient's percutaneous drainage catheter was converted from a JP bulb to a gravity bag. - The patient was instructed to no longer flush the percutaneous drainage catheter however to maintain diligent records with getting daily drainage catheter output - As the patient is impending discharge, she will present to the interventional radiology drain clinic in 2 weeks for drainage catheter evaluation and management and repeat drainage catheter injection. - note, CT imaging is not required (unless patient has recurrence of abdominal pain or fever), as CT scan performed 04/12/2019 demonstrated resolution of the pericolonic diverticular abscess. Electronically Signed   By: Simonne Come M.D.   On: 04/12/2019 15:42   Ct Image Guided Drainage By Percutaneous Catheter  Result Date: 04/06/2019 INDICATION: 68 year old female with sigmoid colonic diverticulitis and large intramural abscess. She presents for CT-guided drain placement. EXAM: CT-guided drain placement MEDICATIONS: The patient is currently admitted to the hospital and receiving intravenous antibiotics. The antibiotics were administered within an appropriate time frame prior to the initiation of the procedure. ANESTHESIA/SEDATION: Fentanyl 100 mcg IV; Versed 2 mg IV Moderate Sedation Time:  17 minutes The patient was continuously monitored during the procedure by the interventional radiology nurse under my direct supervision. COMPLICATIONS: None immediate. PROCEDURE: Informed written consent was obtained from the patient after a thorough discussion of the procedural risks, benefits and alternatives. All questions were addressed. Maximal Sterile Barrier Technique was utilized including caps, mask, sterile gowns, sterile gloves, sterile drape, hand hygiene and skin antiseptic. A timeout was performed prior to the initiation of the  procedure. A planning axial CT scan was performed. The fluid and gas collection in the left lower quadrant of the anatomic pelvis position between the sigmoid colon, uterus and bladder was successfully identified. A suitable skin entry site was selected and marked. Care was taken to ensure that the entry site was below the redundant portion of the sigmoid colon. After sterile prep and drape in the standard fashion with chlorhexidine, local anesthesia was attained by infiltration with 1% lidocaine. A small dermatotomy was made. Under intermittent CT guidance, an 18 gauge trocar needle was advanced into the fluid and gas collection. A 0.035 wire was then advanced into the fluid and gas collection. The needle was exchanged for a fascial dilator and the soft tissue tract was dilated to 12 [place]. A Cook 12 [place] all-purpose drainage catheter modified with additional sideholes was then advanced over the wire and formed. Aspiration yields approximately 80 cc of thick purulent material. A sample was sent for Gram stain and culture. The abscess cavity was lavaged with sterile saline and the drain connected to JP bulb suction. The drain was then secured to the skin with 0 Prolene suture and an adhesive fixation device. The patient tolerated the procedure well. IMPRESSION: Successful placement of a 12 French drainage catheter modified with additional sideholes. Aspiration yields 80 mL of frankly purulent and foul-smelling fluid. Samples were sent for Gram stain and culture. PLAN: Maintain drain to JP bulb suction. Flush drainage catheter at least once per shift. When drain output has decreased significantly, recommend follow-up with both contrast-enhanced CT scan of the pelvis to assess for resolution of the abscess as well as drain injection to assess for fistula with  the sigmoid colon given the intramural location of the abscess. Signed, Sterling Big, MD, RPVI [facility]  [facility] Radiology Electronically Signed   By: Malachy Moan M.D.   On: 04/06/2019 12:42   [place] Ekg Site Rite  Result Date: 04/08/2019 If Site Rite image not attached, placement could not be confirmed due to current cardiac rhythm.    TODAY-DAY OF DISCHARGE:  Subjective:   [name] today has no headache,no chest abdominal pain,no new weakness tingling or numbness, feels much better wants to go home today.   Objective:   Blood pressure (!) 126/91, pulse 70, temperature 98.4 F (36.9 C), temperature source Oral, resp. rate 16, height  (1.6 m), weight 42.7 kg, SpO2 96 %.  Intake/Output Summary (Last 24 hours) at 04/14/2019 1259 Last data filed at 04/14/2019 0900 Gross per 24 hour  Intake 910 ml  Output 0 ml  Net 910 ml   Filed Weights   04/06/19 0634  Weight: 42.7 kg    Exam: Awake Alert, Oriented *3, No new F.N deficits, Normal affect [place].AT,PERRAL Supple Neck,No JVD, No cervical lymphadenopathy appriciated.  Symmetrical Chest wall movement, Good air movement bilaterally, CTAB RRR,No Gallops,Rubs or new Murmurs, No Parasternal Heave +ve B.Sounds, Abd Soft, Non tender, No organomegaly appriciated, No rebound -guarding or rigidity. No Cyanosis, Clubbing or edema, No new Rash or bruise   PERTINENT RADIOLOGIC STUDIES: Ct Head Wo Contrast  Result Date: 04/06/2019 CLINICAL DATA:  Altered level of consciousness today. EXAM: CT HEAD WITHOUT CONTRAST TECHNIQUE: Contiguous axial images were obtained from the base of the skull through the vertex without intravenous contrast. COMPARISON:  Head CT scan 06/04/2014. FINDINGS: Brain: No evidence of acute infarction, hemorrhage, hydrocephalus, extra-axial collection or mass lesion/mass effect. Chronic microvascular ischemic change has markedly worsened since the prior head CT. Left frontal infarct is remote but new since the prior CT. Vascular: No hyperdense vessel or unexpected calcification. Skull: Intact.  No focal lesion.  Sinuses/Orbits: Negative. Other: None. IMPRESSION: No acute abnormality. Extensive chronic microvascular ischemic change. Remote left frontal infarct noted. Electronically Signed   By: Drusilla Kanner M.D.   On: 04/06/2019 13:34   Mr Brain Wo Contrast  Result Date: 04/07/2019 CLINICAL DATA:  Encephalopathy. EXAM: MRI HEAD WITHOUT CONTRAST TECHNIQUE: Multiplanar, multiecho pulse sequences of the brain and surrounding structures were obtained without intravenous contrast. COMPARISON:  Head CT 04/06/2019, brain MRI 02/05/2009 FINDINGS: Brain: Several sequences are motion degraded. There is no evidence of acute infarct. No evidence of intracranial mass. No midline shift or extra-axial fluid collection. Foci of encephalomalacia and chronic hemosiderin deposition within the left frontal lobe at sites of previous hemorrhagic contusions. Advanced scattered T2/FLAIR hyperintensity within the cerebral white matter has progressed since prior MRI 02/05/2009. However, the previously described large right frontal parietal white matter lesion has not significantly changed. A chronic lacunar infarct within the right thalamus is also new from this prior MRI. Mild generalized parenchymal atrophy. Vascular: Flow voids maintained within the proximal large arterial vessels. Skull and upper cervical spine: Normal marrow signal. Incompletely assessed cervical spondylosis with C4-C5 posterior disc osteophyte complex. Sinuses/Orbits: Imaged globes and orbits demonstrate no acute abnormality. Mild ethmoid sinus mucosal thickening. Trace fluid within right mastoid air cells IMPRESSION: Motion degraded examination. No evidence of acute intracranial abnormality, including acute infarct. Foci of encephalomalacia within the left frontal lobe, sequela of remote hemorrhagic contusions. Advanced signal changes within the white matter have significantly progressed since remote MRI 02/05/2009. Findings are nonspecific but may  reflect advanced  chronic small vessel ischemic disease. Alternative etiologies such as a demyelinating process cannot be excluded. Chronic right thalamic lacunar infarct, new from prior exam. Electronically Signed   By: Jackey Loge   On: 04/07/2019 14:30   Ct Abdomen Pelvis W Contrast  Result Date: 04/12/2019 CLINICAL DATA:  Status post percutaneous catheter drainage of large sigmoid diverticular abscess on 04/05/2019. EXAM: CT ABDOMEN AND PELVIS WITH CONTRAST TECHNIQUE: Multidetector CT imaging of the abdomen and pelvis was performed using the standard protocol following bolus administration of intravenous contrast. CONTRAST:  80mL OMNIPAQUE IOHEXOL 300 MG/ML  SOLN COMPARISON:  04/05/2019 FINDINGS: Lower chest: No acute abnormality. Hepatobiliary: No focal liver abnormality is seen. Status post cholecystectomy. No biliary dilatation. Pancreas: Unremarkable. No pancreatic ductal dilatation or surrounding inflammatory changes. Spleen: Normal in size without focal abnormality. Adrenals/Urinary Tract: Adrenal glands are unremarkable. Kidneys are normal, without renal calculi, focal lesion, or hydronephrosis. Bladder is unremarkable. Stomach/Bowel: Status post prior bowel resection. Probable component of mild colonic ileus. No small bowel dilatation. No free intraperitoneal air is identified. Vascular/Lymphatic: No significant vascular findings are present. No enlarged abdominal or pelvic lymph nodes. Reproductive: Uterus and bilateral adnexa are unremarkable. Other: At the level of the left lower quadrant percutaneous drainage catheter, there is no further liquefied abscess identified by CT. The abscess cavity is completely decompressed. The drainage catheter lies along the inferior aspect of the distal sigmoid colon with numerous diverticula again visualized. Musculoskeletal: No acute or significant osseous findings. IMPRESSION: 1. Complete evacuation of sigmoid diverticular abscess after percutaneous catheter drainage. The left  lower quadrant drainage catheter lies along the inferior aspect of the distal sigmoid colon. 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 2. Probable component of mild colonic ileus. No small bowel obstruction or evidence of bowel perforation. Electronically Signed   By: Irish Lack M.D.   On: 04/12/2019 09:59   Ct Abdomen Pelvis W Contrast  Result Date: 04/05/2019 CLINICAL DATA:  68 year old female with abdominal pain. History of prior perforated duodenal ulcer and exclude Terri laparotomy and pyloroplasty. EXAM: CT ABDOMEN AND PELVIS WITH CONTRAST TECHNIQUE: Multidetector CT imaging of the abdomen and pelvis was performed using the standard protocol following bolus administration of intravenous contrast. CONTRAST:  OMNIPAQUE IOHEXOL 300 MG/ML  SOLN COMPARISON:  CT of the abdomen pelvis dated 08/05/2011. FINDINGS: Evaluation of this exam is limited due to respiratory motion artifact. Lower chest: The visualized lung bases are clear. Minimal right middle lobe atelectasis/scarring. There is mild cardiomegaly. Coronary vascular calcifications noted. No definite intra-abdominal free air or free fluid. Hepatobiliary: A 1 cm hypodense lesion in the dome of the liver is not well characterized but appears similar to prior CT, likely a cyst. The liver is otherwise unremarkable. There is mild intrahepatic biliary ductal dilatation. No calcified gallstone or pericholecystic fluid. Pancreas: Unremarkable. No pancreatic ductal dilatation or surrounding inflammatory changes. Spleen: Normal in size without focal abnormality. Adrenals/Urinary Tract: The adrenal glands are unremarkable. There is no hydronephrosis on either side. There is symmetric enhancement and excretion of contrast by both kidneys. Subcentimeter left renal hypodense focus is too small to characterize but likely represents a  cyst. The urinary bladder is unremarkable. Stomach/Bowel: Extensive sigmoid diverticulosis with active inflammatory changes. There is a 9.7 x 3.7 x 3.8 cm diverticular abscess along the sigmoid colon. Postsurgical changes of gastrojejunostomy. No evidence of bowel obstruction. A fluid containing structure with pockets  of air in the upper abdomen anteriorly (series 3, image 27) appears similar to the CT of 2013 and may represent the duodenal bulb. A 15 mm high attenuating structure within this may represent a chronic stone although an enhancing lesion is not entirely excluded. Vascular/Lymphatic: Moderate aortoiliac atherosclerotic disease. The IVC is unremarkable. No portal venous gas. There is no adenopathy. Reproductive: The uterus is grossly unremarkable. There is loss of fat plane between the superior uterus and diverticular abscess of the sigmoid colon. No adnexal masses. Other: None Musculoskeletal: Osteopenia. No acute osseous pathology. IMPRESSION: 1. Sigmoid diverticulitis with a large diverticular abscess. There is loss of fat plane between the superior uterus and diverticular abscess. 2. A 15 mm high attenuating structure in the anterior upper abdomen appears similar to prior CT and may represent the duodenal bulb. Aortic Atherosclerosis (ICD10-I70.0). Electronically Signed   By: Elgie Collard M.D.   On: 04/05/2019 14:14   Ir Sinus/fist Tube Chk-non Gi  Result Date: 04/12/2019 CLINICAL DATA:  History of diverticular abscess, post percutaneous drainage catheter placement 04/05/2019; subsequent CT scan the abdomen and pelvis performed 04/12/2019 demonstrates near complete resolution of the pericolonic diverticular abscess. Patient reports very little output from the percutaneous drainage catheter and as such presents today for drainage catheter injection. EXAM: SINUS TRACT INJECTION/FISTULOGRAM COMPARISON:  CT-guided percutaneous drainage catheter placement-04/05/2019; CT abdomen and  pelvis-04/05/2019; 04/12/2019 CONTRAST:  10mL OMNIPAQUE IOHEXOL 300 MG/ML  SOLN FLUOROSCOPY TIME:  30 seconds (2 mGy) TECHNIQUE: The patient was positioned supine on the fluoroscopy table. A preprocedural spot fluoroscopic image was obtained of the left lower abdomen/pelvis and existing percutaneous drainage catheter Multiple spot fluoroscopic and radiographic images were obtained following the injection of a small amount of contrast via the existing percutaneous drainage catheter. Images reviewed and the decision was made to maintain the percutaneous drainage catheter. As such, the drainage catheter was flushed with a small of saline and converted from a JP bulb to a gravity bag. A dressing was applied. The patient tolerated the procedure well without immediate postprocedural complication. FINDINGS: Preprocedural spot fluoroscopic image demonstrates unchanged positioning pre-existing percutaneous drainage catheter with end coiled and locked over the left lower abdomen/pelvis. A small amount of excreted contrast from recent CT scan is seen within the urinary bladder Contrast injection demonstrates opacification of decompressed abscess cavity with fistulous connection between the residual collection in the adjacent sigmoid colon. 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 PLAN: - The patient's percutaneous drainage catheter was converted from a JP bulb to a gravity bag. - The patient was instructed to no longer flush the percutaneous drainage catheter however to maintain diligent records with getting daily drainage catheter output - As the patient is impending discharge, she will present to the interventional radiology drain clinic in 2 weeks for drainage catheter evaluation and management and repeat drainage catheter injection. - note, CT imaging is not required (unless patient has recurrence of abdominal pain or fever),  as CT scan performed 04/12/2019 demonstrated resolution of the pericolonic diverticular abscess. Electronically Signed   By: Simonne Come M.D.   On: 04/12/2019 15:42   Ct Image Guided Drainage By Percutaneous Catheter  Result Date: 04/06/2019 INDICATION: 68 year old female with sigmoid colonic diverticulitis and large intramural abscess. She presents for CT-guided drain placement. EXAM: CT-guided drain placement MEDICATIONS: The patient is currently admitted to the hospital and receiving intravenous antibiotics. The antibiotics were administered within an appropriate time frame prior to the  initiation of the procedure. ANESTHESIA/SEDATION: Fentanyl 100 mcg IV; Versed 2 mg IV Moderate Sedation Time:  17 minutes The patient was continuously monitored during the procedure by the interventional radiology nurse under my direct supervision. COMPLICATIONS: None immediate. PROCEDURE: Informed written consent was obtained from the patient after a thorough discussion of the procedural risks, benefits and alternatives. All questions were addressed. Maximal Sterile Barrier Technique was utilized including caps, mask, sterile gowns, sterile gloves, sterile drape, hand hygiene and skin antiseptic. A timeout was performed prior to the initiation of the procedure. A planning axial CT scan was performed. The fluid and gas collection in the left lower quadrant of the anatomic pelvis position between the sigmoid colon, uterus and bladder was successfully identified. A suitable skin entry site was selected and marked. Care was taken to ensure that the entry site was below the redundant portion of the sigmoid colon. After sterile prep and drape in the standard fashion with chlorhexidine, local anesthesia was attained by infiltration with 1% lidocaine. A small dermatotomy was made. Under intermittent CT guidance, an 18 gauge trocar needle was advanced into the fluid and gas collection. A 0.035 wire was then advanced into the fluid and  gas collection. The needle was exchanged for a fascial dilator and the soft tissue tract was dilated to 12 [place]. A Cook 12 [place] all-purpose drainage catheter modified with additional sideholes was then advanced over the wire and formed. Aspiration yields approximately 80 cc of thick purulent material. A sample was sent for Gram stain and culture. The abscess cavity was lavaged with sterile saline and the drain connected to JP bulb suction. The drain was then secured to the skin with 0 Prolene suture and an adhesive fixation device. The patient tolerated the procedure well. IMPRESSION: Successful placement of a 12 French drainage catheter modified with additional sideholes. Aspiration yields 80 mL of frankly purulent and foul-smelling fluid. Samples were sent for Gram stain and culture. PLAN: Maintain drain to JP bulb suction. Flush drainage catheter at least once per shift. When drain output has decreased significantly, recommend follow-up with both contrast-enhanced CT scan of the pelvis to assess for resolution of the abscess as well as drain injection to assess for fistula with the sigmoid colon given the intramural location of the abscess. Signed, Sterling Big, MD, RPVI [facility] Electronically Signed   By: Malachy Moan M.D.   On: 04/06/2019 12:42   [place] Ekg Site Rite  Result Date: 04/08/2019 If Site Rite image not attached, placement could not be confirmed due to current cardiac rhythm.    PERTINENT LAB RESULTS: CBC: No results for input(s): WBC, HGB, HCT, PLT in the last 72 hours. CMET CMP     Component Value Date/Time   NA 129 (L) 04/14/2019 0321   K 4.5 04/14/2019 0321   CL 91 (L) 04/14/2019 0321   CO2 28 04/14/2019 0321   GLUCOSE 106 (H) 04/14/2019 0321   BUN 12 04/14/2019 0321   CREATININE 0.45 04/14/2019 0321   CALCIUM 8.6 (L) 04/14/2019 0321   PROT 5.4 (L) 04/14/2019 0321   ALBUMIN 2.8 (L) 04/14/2019 0321    AST 25 04/14/2019 0321   ALT 21 04/14/2019 0321   ALKPHOS 73 04/14/2019 0321   BILITOT 0.4 04/14/2019 0321   GFRNONAA >60 04/14/2019 0321   GFRAA >60 04/14/2019 0321    GFR Estimated Creatinine Clearance: 46 mL/min (by C-G formula based on SCr of 0.45 mg/dL). No results for input(s): LIPASE, AMYLASE  in the last 72 hours. No results for input(s): CKTOTAL, CKMB, CKMBINDEX, TROPONINI in the last 72 hours. Invalid input(s): POCBNP No results for input(s): DDIMER in the last 72 hours. No results for input(s): HGBA1C in the last 72 hours. No results for input(s): CHOL, HDL, LDLCALC, TRIG, CHOLHDL, LDLDIRECT in the last 72 hours. No results for input(s): TSH, T4TOTAL, T3FREE, THYROIDAB in the last 72 hours.  Invalid input(s): FREET3 No results for input(s): VITAMINB12, FOLATE, FERRITIN, TIBC, IRON, RETICCTPCT in the last 72 hours. Coags: No results for input(s): INR in the last 72 hours.  Invalid input(s): PT Microbiology: Recent Results (from the past 240 hour(s))  Urine culture     Status: Abnormal   Collection Time: 04/05/19  8:45 AM   Specimen: Urine, Random  Result Value Ref Range Status   Specimen Description URINE, RANDOM  Final   Special Requests NONE  Final   Culture (A)  Final    <10,000 COLONIES/mL INSIGNIFICANT GROWTH Performed at [facility], [address] [place], [place] [address]    Report Status 04/06/2019 FINAL  Final  SARS Coronavirus 2 [facility](Hospital order, Performed [facility]) Nasopharyngeal Nasopharyngeal Swab     Status: None   Collection Time: 04/05/19  2:45 PM   Specimen: Nasopharyngeal Swab  Result Value Ref Range Status   SARS Coronavirus 2 NEGATIVE NEGATIVE Final    Comment: (NOTE) If result is NEGATIVE SARS-CoV-2 target nucleic acids are NOT DETECTED. The SARS-CoV-2 RNA is generally detectable in upper and lower  respiratory specimens during the acute phase of infection. The lowest  concentration of SARS-CoV-2 viral copies  this assay can detect is 250  copies / mL. A negative result does not preclude SARS-CoV-2 infection  and should not be used as the sole basis for treatment or other  patient management decisions.  A negative result may occur with  improper specimen collection / handling, submission of specimen other  than nasopharyngeal swab, presence of viral mutation(s) within the  areas targeted by this assay, and inadequate number of viral copies  (<250 copies / mL). A negative result must be combined with clinical  observations, patient history, and epidemiological information. If result is POSITIVE SARS-CoV-2 target nucleic acids are DETECTED. The SARS-CoV-2 RNA is generally detectable in upper and lower  respiratory specimens dur ing the acute phase of infection.  Positive  results are indicative of active infection with SARS-CoV-2.  Clinical  correlation with patient history and other diagnostic information is  necessary to determine patient infection status.  Positive results do  not rule out bacterial infection or co-infection with other viruses. If result is PRESUMPTIVE POSTIVE SARS-CoV-2 nucleic acids MAY BE PRESENT.   A presumptive positive result was obtained on the submitted specimen  and confirmed on repeat testing.  While 2019 novel coronavirus  (SARS-CoV-2) nucleic acids may be present in the submitted sample  additional confirmatory testing may be necessary for epidemiological  and / or clinical management purposes  to differentiate between  SARS-CoV-2 and other Sarbecovirus currently known to infect humans.  If clinically indicated additional testing with an alternate test  methodology [phone]([id number]) is advised. The SARS-CoV-2 RNA is generally  detectable in upper and lower respiratory sp ecimens during the acute  phase of infection. The expected result is Negative. Fact Sheet for Patients:  BoilerBrush.com.cyhttps://www.fda.gov/media/136312/download Fact Sheet for Healthcare  Providers: https://pope.com/https://www.fda.gov/media/136313/download This test is not yet approved or cleared by the [place] FDA and has been authorized for detection and/or  diagnosis of SARS-CoV-2 by FDA under an Emergency Use Authorization (EUA).  This EUA will remain in effect (meaning this test can be used) for the duration of the COVID-19 declaration under Section 564(b)(1) of the Act, 21 U.S.C. section 360bbb-3(b)(1), unless the authorization is terminated or revoked sooner. Performed at [facility], [address], [place], [place] [address]   Aerobic/Anaerobic Culture (surgical/deep wound)     Status: Abnormal   Collection Time: 04/05/19  6:49 PM   Specimen: Abscess  Result Value Ref Range Status   Specimen Description ABSCESS  Final   Special Requests Normal  Final   Gram Stain   Final    FEW WBC PRESENT, PREDOMINANTLY PMN MODERATE GRAM POSITIVE COCCI Performed at [facility], [address] [place], [place] [address]    Culture (A)  Final    MULTIPLE ORGANISMS PRESENT, NONE PREDOMINANT MIXED ANAEROBIC FLORA PRESENT.  CALL LAB IF FURTHER IID REQUIRED.    Report Status 04/09/2019 FINAL  Final    FURTHER DISCHARGE INSTRUCTIONS:  Get Medicines reviewed and adjusted: Please take all your medications with you for your next visit with your Primary MD  Laboratory/radiological data: Please request your Primary MD to go over all hospital tests and procedure/radiological results at the follow up, please ask your Primary MD to get all Hospital records sent to his/her office.  In some cases, they will be blood work, cultures and biopsy results pending at the time of your discharge. Please request that your primary care M.D. goes through all the records of your hospital data and follows up on these results.  Also Note the following: If you experience worsening of your admission symptoms, develop shortness of breath, life threatening emergency, suicidal or homicidal thoughts  you must seek medical attention immediately by calling 911 or calling your MD immediately  if symptoms less severe.  You must read complete instructions/literature along with all the possible adverse reactions/side effects for all the Medicines you take and that have been prescribed to you. Take any new Medicines after you have completely understood and accpet all the possible adverse reactions/side effects.   Do not drive when taking Pain medications or sleeping medications (Benzodaizepines)  Do not take more than prescribed Pain, Sleep and Anxiety Medications. It is not advisable to combine anxiety,sleep and pain medications without talking with your primary care practitioner  Special Instructions: If you have smoked or chewed Tobacco  in the last 2 yrs please stop smoking, stop any regular Alcohol  and or any Recreational drug use.  Wear Seat belts while driving.  Please note: You were cared for by a hospitalist during your hospital stay. Once you are discharged, your primary care physician will handle any further medical issues. Please note that NO REFILLS for any discharge medications will be authorized once you are discharged, as it is imperative that you return to your primary care physician (or establish a relationship with a primary care physician if you do not have one) for your post hospital discharge needs so that they can reassess your need for medications and monitor your lab values.  Total Time spent coordinating discharge including counseling, education and face to face time equals 35 minutes.  SignedJeoffrey Massed 04/14/2019 12:59 PM

## 2019-04-14 NOTE — Plan of Care (Signed)

## 2019-04-21 ENCOUNTER — Ambulatory Visit: Payer: PPO

## 2019-04-26 ENCOUNTER — Ambulatory Visit
Admission: RE | Admit: 2019-04-26 | Discharge: 2019-04-26 | Disposition: A | Discharge status: Home / Self Care | Payer: PPO | Source: Ambulatory Visit | Attending: Student | Admitting: Student

## 2019-04-26 ENCOUNTER — Encounter: Payer: Self-pay | Admitting: Radiology

## 2019-04-26 ENCOUNTER — Other Ambulatory Visit: Payer: Self-pay | Admitting: General Surgery

## 2019-04-26 DIAGNOSIS — K651 Peritoneal abscess: Secondary | ICD-10-CM

## 2019-04-26 DIAGNOSIS — K5780 Diverticulitis of intestine, part unspecified, with perforation and abscess without bleeding: Secondary | ICD-10-CM | POA: Diagnosis not present

## 2019-04-26 HISTORY — PX: IR RADIOLOGIST EVAL & MGMT: IMG5224

## 2019-04-26 NOTE — Progress Notes (Signed)
Patient ID: [name], female   DOB: 1950/11/03, 68 y.o.   MRN: [id number]       Chief Complaint: Patient was seen in consultation today for follow-up abscess drain at the request of [facility]  Referring Physician(s): Dr. [name]  History of Present Illness: [name] is a 68 y.o. female who presented with diverticular sigmoid abscess on CT 04/05/2019, had percutaneous drain placed following day.  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 The catheter has been maintained to gravity bag without flushings.  Less than 10 mL output per day.  Site of catheter entry of the skin remains intact.  She presents for fistula evaluation.  No fevers.  No new abdominal pain.  Past Medical History:  Diagnosis Date   Alcoholism (HCC) 02/02/2009   ANXIETY 01/21/2007   Blood transfusion    hx of 2005    DIVERTICULOSIS 07/09/2007   Duodenal ulcer hemorrhage perforated 2006   Headache(784.0) 05/31/2007   pt denies at 08/12/11 preop visit    HYPERTENSION 06/23/2007   HYPONATREMIA 02/02/2009   better off diuretic   INSOMNIA 05/31/2007   Iron deficiency anemia    pt denies at 08/12/11 visit    NSAID-induced duodenal ulcer    OSTEOPOROSIS 01/21/2007   Raynaud's syndrome 06/23/2007   Recurrent upper respiratory infection (URI)    pt reports slight cold using Nyquil prn at bedtime    VITAMIN B12 DEFICIENCY 10/02/2009   Zoster 2012   twice - left groin/vagina    Past Surgical History:  Procedure Laterality Date   COLONOSCOPY  06/18/2007   angulated and stenotic sigmoid colon with severe sigmoid diverticulosis   EXPLORATORY LAPAROTOMY  08/03/2004   1) duodenal ulcer oversew, pyloroplasty, anterior, posterior and truncal vagotomies   EXPLORATORY LAPAROTOMY  10/20/2004   1) duodenal ulcer oversew  with exclusion2) side-side gastrojejunostomy 3) feeding jejunostomy   IR RADIOLOGIST EVAL  & MGMT  04/26/2019   IR SINUS/FIST TUBE CHK-NON GI  04/12/2019   LAPAROSCOPY  08/14/2011   Procedure: LAPAROSCOPY DIAGNOSTIC;  Surgeon: Kandis Cocking, MD;  Location: [facility];  Service: General;  Laterality: N/A;   LAPAROTOMY  08/14/2011   Procedure: EXPLORATORY LAPAROTOMY;  Surgeon: Kandis Cocking, MD;  Location: [facility];  Service: General;  Laterality: N/A;  exploratory laparotomy, lysis of adhesions, revision gastrojejunostomy, upper endoscopy   OPEN REDUCTION INTERNAL FIXATION (ORIF) DISTAL RADIAL FRACTURE Right 05/01/2016   Procedure: OPEN REDUCTION INTERNAL FIXATION (ORIF) DISTAL RADIAL FRACTURE, right;  Surgeon: Betha Loa, MD;  Location: [facility];  Service: Orthopedics;  Laterality: Right;   TUBAL LIGATION  1977   UPPER GASTROINTESTINAL ENDOSCOPY  09/16/2007; 12/21/2008; 03/17/2011   2009: Normal 2010: Normal with enteroscopy2012: Gastrojejunal  anastomotic ulcer, gastric retention    Allergies: Sulfa antibiotics, Sulfonamide derivatives, Penicillins, and Hydromorphone hcl  Medications: Prior to Admission medications   Medication Sig Start Date End Date Taking? Authorizing Provider  acetaminophen (TYLENOL) 500 MG tablet Take 500 mg by mouth every 6 (six) hours as needed for mild pain.     [provider]  amLODipine (NORVASC) 10 MG tablet Take 1 tablet (10 mg total) by mouth daily. 04/15/19   Ghimire, Werner Lean, MD  aspirin 81 MG chewable tablet Chew 1 tablet (81 mg total) by mouth daily. 04/15/19   Ghimire, Werner Lean, MD  cefdinir (OMNICEF) 300 MG capsule Take 1 capsule (300 mg total) by mouth 2 (two)  times daily for 14 days. 04/14/19 04/28/19  Ghimire, Werner Lean, MD  feeding supplement, ENSURE ENLIVE, (ENSURE ENLIVE) LIQD Take 237 mLs by mouth 3 (three) times daily between meals. 04/14/19 05/14/19  Ghimire, Werner Lean, MD  folic acid (FOLVITE) 1 MG tablet Take 1 tablet (1 mg total) by mouth daily. 04/15/19   Ghimire, Werner Lean, MD  metroNIDAZOLE (FLAGYL) 500 MG  tablet Take 1 tablet (500 mg total) by mouth 3 (three) times daily for 14 days. 04/14/19 04/28/19  Ghimire, Werner Lean, MD  pantoprazole (PROTONIX) 40 MG tablet Take 1 tablet (40 mg total) by mouth daily. 04/14/19 05/14/19  Ghimire, Werner Lean, MD  thiamine 100 MG tablet Take 1 tablet (100 mg total) by mouth daily. 04/15/19   Ghimire, Werner Lean, MD     Family History  Problem Relation Age of Onset   Stroke Father     Social History   Socioeconomic History   Marital status: Married    Spouse name: Not on file   Number of children: 1   Years of education: Not on file   Highest education level: Not on file  Occupational History   Occupation: retired    Associate Professor: UNEMPLOYED  Social Network engineer strain: Not on file   Food insecurity    Worry: Not on file    Inability: Not on file   Transportation needs    Medical: Not on file    Non-medical: Not on file  Tobacco Use   Smoking status: Never Smoker   Smokeless tobacco: Never Used  Substance and Sexual Activity   Alcohol use: Yes    Alcohol/week: 7.0 standard drinks    Types: 7 Cans of beer per week    Comment: 12 oz   Drug use: No   Sexual activity: Not on file  Lifestyle   Physical activity    Days per week: Not on file    Minutes per session: Not on file   Stress: Not on file  Relationships   Social connections    Talks on phone: Not on file    Gets together: Not on file    Attends religious service: Not on file    Active member of club or organization: Not on file    Attends meetings of clubs or organizations: Not on file    Relationship status: Not on file  Other Topics Concern   Not on file  Social History Narrative   Not on file    ECOG Status: 1 - Symptomatic but completely ambulatory    Physical Exam Constitutional: Oriented to person, place, and time. Well-developed and well-nourished. No distress.  Vital Signs: BP 121/63    Pulse 70    SpO2 100%  HENT:  Head:  Normocephalic and atraumatic.  Eyes: Conjunctivae and EOM are normal. Right eye exhibits no discharge. Left eye exhibits no discharge. No scleral icterus.  Neck: No JVD present.  Pulmonary/Chest: Effort normal. No stridor. No respiratory distress.  Abdomen: soft, non distended Neurological:  alert and oriented to person, place, and time.  Skin: Skin is warm and dry.  not diaphoretic.  Psychiatric:   normal mood and affect.   behavior is normal. Judgment and thought content normal.   Mallampati Score: Review of Systems Review of Systems: A 12 point ROS discussed and pertinent positives are indicated in the HPI above.  All other systems are negative.       Imaging: Ct Head Wo Contrast  Result Date: 04/06/2019 CLINICAL DATA:  Altered level of consciousness today. EXAM: CT HEAD WITHOUT CONTRAST TECHNIQUE: Contiguous axial images were obtained from the base of the skull through the vertex without intravenous contrast. COMPARISON:  Head CT scan 06/04/2014. FINDINGS: Brain: No evidence of acute infarction, hemorrhage, hydrocephalus, extra-axial collection or mass lesion/mass effect. Chronic microvascular ischemic change has markedly worsened since the prior head CT. Left frontal infarct is remote but new since the prior CT. Vascular: No hyperdense vessel or unexpected calcification. Skull: Intact.  No focal lesion. Sinuses/Orbits: Negative. Other: None. IMPRESSION: No acute abnormality. Extensive chronic microvascular ischemic change. Remote left frontal infarct noted. Electronically Signed   By: Drusilla Kannerhomas  Dalessio M.D.   On: 04/06/2019 13:34   Mr Brain Wo Contrast  Result Date: 04/07/2019 CLINICAL DATA:  Encephalopathy. EXAM: MRI HEAD WITHOUT CONTRAST TECHNIQUE: Multiplanar, multiecho pulse sequences of the brain and surrounding structures were obtained without intravenous contrast. COMPARISON:  Head CT 04/06/2019, brain MRI 02/05/2009 FINDINGS: Brain: Several sequences are motion degraded. There is  no evidence of acute infarct. No evidence of intracranial mass. No midline shift or extra-axial fluid collection. Foci of encephalomalacia and chronic hemosiderin deposition within the left frontal lobe at sites of previous hemorrhagic contusions. Advanced scattered T2/FLAIR hyperintensity within the cerebral white matter has progressed since prior MRI 02/05/2009. However, the previously described large right frontal parietal white matter lesion has not significantly changed. A chronic lacunar infarct within the right thalamus is also new from this prior MRI. Mild generalized parenchymal atrophy. Vascular: Flow voids maintained within the proximal large arterial vessels. Skull and upper cervical spine: Normal marrow signal. Incompletely assessed cervical spondylosis with C4-C5 posterior disc osteophyte complex. Sinuses/Orbits: Imaged globes and orbits demonstrate no acute abnormality. Mild ethmoid sinus mucosal thickening. Trace fluid within right mastoid air cells IMPRESSION: Motion degraded examination. No evidence of acute intracranial abnormality, including acute infarct. Foci of encephalomalacia within the left frontal lobe, sequela of remote hemorrhagic contusions. Advanced signal changes within the white matter have significantly progressed since remote MRI 02/05/2009. Findings are nonspecific but may reflect advanced chronic small vessel ischemic disease. Alternative etiologies such as a demyelinating process cannot be excluded. Chronic right thalamic lacunar infarct, new from prior exam. Electronically Signed   By: Jackey LogeKyle  Golden   On: 04/07/2019 14:30   Ct Abdomen Pelvis W Contrast  Result Date: 04/12/2019 CLINICAL DATA:  Status post percutaneous catheter drainage of large sigmoid diverticular abscess on 04/05/2019. EXAM: CT ABDOMEN AND PELVIS WITH CONTRAST TECHNIQUE: Multidetector CT imaging of the abdomen and pelvis was performed using the standard protocol following bolus administration of intravenous  contrast. CONTRAST:  80mL OMNIPAQUE IOHEXOL 300 MG/ML  SOLN COMPARISON:  04/05/2019 FINDINGS: Lower chest: No acute abnormality. Hepatobiliary: No focal liver abnormality is seen. Status post cholecystectomy. No biliary dilatation. Pancreas: Unremarkable. No pancreatic ductal dilatation or surrounding inflammatory changes. Spleen: Normal in size without focal abnormality. Adrenals/Urinary Tract: Adrenal glands are unremarkable. Kidneys are normal, without renal calculi, focal lesion, or hydronephrosis. Bladder is unremarkable. Stomach/Bowel: Status post prior bowel resection. Probable component of mild colonic ileus. No small bowel dilatation. No free intraperitoneal air is identified. Vascular/Lymphatic: No significant vascular findings are present. No enlarged abdominal or pelvic lymph nodes. Reproductive: Uterus and bilateral adnexa are unremarkable. Other: At the level of the left lower quadrant percutaneous drainage catheter, there is no further liquefied abscess identified by CT. The abscess cavity is completely decompressed. The drainage catheter lies along the inferior aspect of the distal sigmoid colon with numerous diverticula again visualized. Musculoskeletal: No acute  or significant osseous findings. IMPRESSION: 1. Complete evacuation of sigmoid diverticular abscess after percutaneous catheter drainage. The left lower quadrant drainage catheter lies along the inferior aspect of the distal sigmoid colon. 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 2. Probable component of mild colonic ileus. No small bowel obstruction or evidence of bowel perforation. Electronically Signed   By: Irish Lack M.D.   On: 04/12/2019 09:59   Ct Abdomen Pelvis W Contrast  Result Date: 04/05/2019 CLINICAL DATA:  68 year old female with abdominal pain. History of prior perforated duodenal ulcer and  exclude Terri laparotomy and pyloroplasty. EXAM: CT ABDOMEN AND PELVIS WITH CONTRAST TECHNIQUE: Multidetector CT imaging of the abdomen and pelvis was performed using the standard protocol following bolus administration of intravenous contrast. CONTRAST:  OMNIPAQUE IOHEXOL 300 MG/ML  SOLN COMPARISON:  CT of the abdomen pelvis dated 08/05/2011. FINDINGS: Evaluation of this exam is limited due to respiratory motion artifact. Lower chest: The visualized lung bases are clear. Minimal right middle lobe atelectasis/scarring. There is mild cardiomegaly. Coronary vascular calcifications noted. No definite intra-abdominal free air or free fluid. Hepatobiliary: A 1 cm hypodense lesion in the dome of the liver is not well characterized but appears similar to prior CT, likely a cyst. The liver is otherwise unremarkable. There is mild intrahepatic biliary ductal dilatation. No calcified gallstone or pericholecystic fluid. Pancreas: Unremarkable. No pancreatic ductal dilatation or surrounding inflammatory changes. Spleen: Normal in size without focal abnormality. Adrenals/Urinary Tract: The adrenal glands are unremarkable. There is no hydronephrosis on either side. There is symmetric enhancement and excretion of contrast by both kidneys. Subcentimeter left renal hypodense focus is too small to characterize but likely represents a cyst. The urinary bladder is unremarkable. Stomach/Bowel: Extensive sigmoid diverticulosis with active inflammatory changes. There is a 9.7 x 3.7 x 3.8 cm diverticular abscess along the sigmoid colon. Postsurgical changes of gastrojejunostomy. No evidence of bowel obstruction. A fluid containing structure with pockets of air in the upper abdomen anteriorly (series 3, image 27) appears similar to the CT of 2013 and may represent the duodenal bulb. A 15 mm high attenuating structure within this may represent a chronic stone although an enhancing lesion is not entirely excluded. Vascular/Lymphatic:  Moderate aortoiliac atherosclerotic disease. The IVC is unremarkable. No portal venous gas. There is no adenopathy. Reproductive: The uterus is grossly unremarkable. There is loss of fat plane between the superior uterus and diverticular abscess of the sigmoid colon. No adnexal masses. Other: None Musculoskeletal: Osteopenia. No acute osseous pathology. IMPRESSION: 1. Sigmoid diverticulitis with a large diverticular abscess. There is loss of fat plane between the superior uterus and diverticular abscess. 2. A 15 mm high attenuating structure in the anterior upper abdomen appears similar to prior CT and may represent the duodenal bulb. Aortic Atherosclerosis (ICD10-I70.0). Electronically Signed   By: Elgie Collard M.D.   On: 04/05/2019 14:14   Ir Sinus/fist Tube Chk-non Gi  Result Date: 04/12/2019 CLINICAL DATA:  History of diverticular abscess, post percutaneous drainage catheter placement 04/05/2019; subsequent CT scan the abdomen and pelvis performed 04/12/2019 demonstrates near complete resolution of the pericolonic diverticular abscess. Patient reports very little output from the percutaneous drainage catheter and as such presents today for drainage catheter injection. EXAM: SINUS TRACT INJECTION/FISTULOGRAM COMPARISON:  CT-guided percutaneous drainage catheter placement-04/05/2019; CT abdomen and pelvis-04/05/2019; 04/12/2019 CONTRAST:  49mL OMNIPAQUE IOHEXOL 300 MG/ML  SOLN FLUOROSCOPY TIME:  30 seconds (2 mGy) TECHNIQUE:  The patient was positioned supine on the fluoroscopy table. A preprocedural spot fluoroscopic image was obtained of the left lower abdomen/pelvis and existing percutaneous drainage catheter Multiple spot fluoroscopic and radiographic images were obtained following the injection of a small amount of contrast via the existing percutaneous drainage catheter. Images reviewed and the decision was made to maintain the percutaneous drainage catheter. As such, the drainage catheter was flushed  with a small of saline and converted from a JP bulb to a gravity bag. A dressing was applied. The patient tolerated the procedure well without immediate postprocedural complication. FINDINGS: Preprocedural spot fluoroscopic image demonstrates unchanged positioning pre-existing percutaneous drainage catheter with end coiled and locked over the left lower abdomen/pelvis. A small amount of excreted contrast from recent CT scan is seen within the urinary bladder Contrast injection demonstrates opacification of decompressed abscess cavity with fistulous connection between the residual collection in the adjacent sigmoid colon. 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 PLAN: - The patient's percutaneous drainage catheter was converted from a JP bulb to a gravity bag. - The patient was instructed to no longer flush the percutaneous drainage catheter however to maintain diligent records with getting daily drainage catheter output - As the patient is impending discharge, she will present to the interventional radiology drain clinic in 2 weeks for drainage catheter evaluation and management and repeat drainage catheter injection. - note, CT imaging is not required (unless patient has recurrence of abdominal pain or fever), as CT scan performed 04/12/2019 demonstrated resolution of the pericolonic diverticular abscess. Electronically Signed   By: Sandi Mariscal M.D.   On: 04/12/2019 15:42   Dg Sinus/fist Tube Chk-non Gi  Result Date: 04/26/2019 CLINICAL DATA:  Intra-abdominal abscess ([place]), post percutaneous drain catheter placement 04/05/2019. 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 Questions  regarding the procedure were encouraged and answered. The patient understands and consents to the procedure. Survey fluoroscopic inspection reveals stable appearance of left pelvic percutaneous pigtail drain catheter. Injection demonstrates little significant residual abscess cavity. There is a short fistula to the distal sigmoid colon confirmed on oblique projections. IMPRESSION: 1. Near complete resolution of abscess cavity with persistent fistula to the sigmoid colon. Electronically Signed   By: Lucrezia Europe M.D.   On: 04/26/2019 13:25   Ct Image Guided Drainage By Percutaneous Catheter  Result Date: 04/06/2019 INDICATION: 68 year old female with sigmoid colonic diverticulitis and large intramural abscess. She presents for CT-guided drain placement. EXAM: CT-guided drain placement MEDICATIONS: The patient is currently admitted to the hospital and receiving intravenous antibiotics. The antibiotics were administered within an appropriate time frame prior to the initiation of the procedure. ANESTHESIA/SEDATION: Fentanyl 100 mcg IV; Versed 2 mg IV Moderate Sedation Time:  17 minutes The patient was continuously monitored during the procedure by the interventional radiology nurse under my direct supervision. COMPLICATIONS: None immediate. PROCEDURE: Informed written consent was obtained from the patient after a thorough discussion of the procedural risks, benefits and alternatives. All questions were addressed. Maximal Sterile Barrier Technique was utilized including caps, mask, sterile gowns, sterile gloves, sterile drape, hand hygiene and skin antiseptic. A timeout was performed prior to the initiation of the procedure. A planning axial CT scan was performed. The fluid and gas collection in the left lower quadrant of the anatomic  pelvis position between the sigmoid colon, uterus and bladder was successfully identified. A suitable skin entry site was selected and marked. Care was taken to ensure that the entry site  was below the redundant portion of the sigmoid colon. After sterile prep and drape in the standard fashion with chlorhexidine, local anesthesia was attained by infiltration with 1% lidocaine. A small dermatotomy was made. Under intermittent CT guidance, an 18 gauge trocar needle was advanced into the fluid and gas collection. A 0.035 wire was then advanced into the fluid and gas collection. The needle was exchanged for a fascial dilator and the soft tissue tract was dilated to 12 [place]. A Cook 12 [place] all-purpose drainage catheter modified with additional sideholes was then advanced over the wire and formed. Aspiration yields approximately 80 cc of thick purulent material. A sample was sent for Gram stain and culture. The abscess cavity was lavaged with sterile saline and the drain connected to JP bulb suction. The drain was then secured to the skin with 0 Prolene suture and an adhesive fixation device. The patient tolerated the procedure well. IMPRESSION: Successful placement of a 12 French drainage catheter modified with additional sideholes. Aspiration yields 80 mL of frankly purulent and foul-smelling fluid. Samples were sent for Gram stain and culture. PLAN: Maintain drain to JP bulb suction. Flush drainage catheter at least once per shift. When drain output has decreased significantly, recommend follow-up with both contrast-enhanced CT scan of the pelvis to assess for resolution of the abscess as well as drain injection to assess for fistula with the sigmoid colon given the intramural location of the abscess. Signed, Sterling Big, MD, RPVI [facility] Electronically Signed   By: Malachy Moan M.D.   On: 04/06/2019 12:42   [place] Ekg Site Rite  Result Date: 04/08/2019 If Site Rite image not attached, placement could not be confirmed due to current cardiac rhythm.  Ir Radiologist Eval & Mgmt  Result Date: 04/26/2019 Please refer to  notes tab for details about interventional procedure. (Op Note)   Labs:  CBC: Recent Labs    04/08/19 0219 04/09/19 0452 04/10/19 0423 04/11/19 0428  WBC 8.1 11.9* 9.9 8.2  HGB 14.0 13.1 13.0 12.0  HCT 37.8 36.1 36.9 34.2*  PLT 379 375 391 387    COAGS: Recent Labs    04/06/19 0404  INR 1.2  APTT 40*    BMP: Recent Labs    04/10/19 0423 04/11/19 0428 04/12/19 0359 04/14/19 0321  NA 130* 130* 134* 129*  K 3.9 3.7 4.1 4.5  CL 99 96* 101 91*  CO2 GLUCOSE 130* 122* 99 106*  BUN 6* CALCIUM 7.9* 8.0* 8.4* 8.6*  CREATININE 0.38* 0.35* 0.51 0.45  GFRNONAA >60 >60 >60 >60  GFRAA >60 >60 >60 >60    LIVER FUNCTION TESTS: Recent Labs    04/09/19 0452 04/10/19 0423 04/11/19 0428 04/14/19 0321  BILITOT 0.3 0.2* <0.1* 0.4  AST 21 26 37 25  ALT ALKPHOS 68 69 64 73  PROT 5.5* 5.9* 5.2* 5.4*  ALBUMIN 2.6* 2.6* 2.6* 2.8*    TUMOR MARKERS: No results for input(s): AFPTM, CEA, CA199, CHROMGRNA in the last 8760 hours.  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 I reviewed the findings with the patient and her  significant other.  I explained that often the defect can persist for several weeks but generally will ultimately heal.  We will leave the drain catheter in place for now, with low volume saline flushes to maintain patency, and keep it to gravity bag only without suction.  She will continue to record daily output.  She is scheduled to meet with Dr. Maisie Fus soon.  We will go ahead and pencil in drain check at a later date, and assuming she does not get scheduled for partial colectomy, will continue drain maintenance until the fistula hopefully heals on its own.  Thank you for this interesting consult.  I greatly enjoyed meeting ONIYA MANDARINO and look forward to participating in their care.  A copy  of this report was sent to the requesting provider on this date.  Electronically Signed: Durwin Glaze 04/26/2019, 1:41 PM   I spent a total of    15 Minutes in face to face in clinical consultation, greater than 50% of which was counseling/coordinating care for persistent fistula from sigmoid colon.

## 2019-04-28 ENCOUNTER — Encounter: Payer: PPO | Admitting: Internal Medicine

## 2019-05-03 ENCOUNTER — Other Ambulatory Visit: Payer: Self-pay

## 2019-05-03 ENCOUNTER — Ambulatory Visit (INDEPENDENT_AMBULATORY_CARE_PROVIDER_SITE_OTHER): Payer: PPO | Admitting: Internal Medicine

## 2019-05-03 DIAGNOSIS — D508 Other iron deficiency anemias: Secondary | ICD-10-CM

## 2019-05-03 DIAGNOSIS — K5720 Diverticulitis of large intestine with perforation and abscess without bleeding: Secondary | ICD-10-CM | POA: Diagnosis not present

## 2019-05-03 DIAGNOSIS — F1020 Alcohol dependence, uncomplicated: Secondary | ICD-10-CM | POA: Diagnosis not present

## 2019-05-03 DIAGNOSIS — I10 Essential (primary) hypertension: Secondary | ICD-10-CM

## 2019-05-03 DIAGNOSIS — E538 Deficiency of other specified B group vitamins: Secondary | ICD-10-CM | POA: Diagnosis not present

## 2019-05-03 MED ORDER — GABAPENTIN 100 MG PO CAPS: 100.0000 mg | ORAL_CAPSULE | Freq: Two times a day (BID) | ORAL | 1 refills | Status: DC

## 2019-05-03 MED ORDER — PANTOPRAZOLE SODIUM 40 MG PO TBEC: 40.0000 mg | DELAYED_RELEASE_TABLET | Freq: Every day | ORAL | 1 refills | Status: DC

## 2019-05-03 MED ORDER — AMLODIPINE BESYLATE 10 MG PO TABS: 10.0000 mg | ORAL_TABLET | Freq: Every day | ORAL | 1 refills | Status: DC

## 2019-05-03 MED ORDER — THIAMINE HCL 100 MG PO TABS: 100.0000 mg | ORAL_TABLET | Freq: Every day | ORAL | 11 refills | Status: DC

## 2019-05-03 MED ORDER — FOLIC ACID 1 MG PO TABS: 1.0000 mg | ORAL_TABLET | Freq: Every day | ORAL | 11 refills | Status: DC

## 2019-05-03 NOTE — Progress Notes (Signed)
Virtual Visit via Telephone Note  I connected with [name] on 05/03/19 at  3:30 PM EDT by telephone and verified that I am speaking with the correct person using two identifiers.   I discussed the limitations, risks, security and privacy concerns of performing an evaluation and management service by telephone and the availability of in person appointments. I also discussed with the patient that there may be a patient responsible charge related to this service. The patient expressed understanding and agreed to proceed.  Location patient: home Location provider: work office Participants present for the call: patient, provider, husband Patient did not have a visit in the prior 7 days to address this/these issue(s).   History of Present Illness:  They have scheduled this visit to establish care, discuss chronic conditions and for medication refills.  Her past medical history significant for:  1.  Hypertension  2.  Peptic ulcer disease  3.  Alcohol abuse  4.  Osteoporosis  She was recently hospitalized from 9/14-9/24 with a diverticular abscess with fistula formation.  She has now completed antibiotic therapy.  She has a drain in place, has already followed up with IR.  She is a former smoker of about 1.5 packs a day but quit about 25 years ago.  She was an alcoholic until recently of about a sixpack of beer a day.  Family history is only significant for a mother who was diagnosed with breast cancer in her 20s, allergies to penicillin.   Observations/Objective: Patient sounds cheerful and well on the phone. I do not appreciate any increased work of breathing. Speech and thought processing are grossly intact. Patient reported vitals: Blood pressure 140/75, heart rate 67, weight 111 which is up from 105 during her hospital stay in late September   Current Outpatient Medications:  .  acetaminophen (TYLENOL) 500 MG tablet, Take 500 mg by mouth every 6 (six) hours as needed for  mild pain. , Disp: , Rfl:  .  amLODipine (NORVASC) 10 MG tablet, Take 1 tablet (10 mg total) by mouth daily., Disp: 90 tablet, Rfl: 1 .  aspirin 81 MG chewable tablet, Chew 1 tablet (81 mg total) by mouth daily., Disp:  , Rfl:  .  feeding supplement, ENSURE ENLIVE, (ENSURE ENLIVE) LIQD, Take 237 mLs by mouth 3 (three) times daily between meals., Disp: 21330 mL, Rfl: 0 .  folic acid (FOLVITE) 1 MG tablet, Take 1 tablet (1 mg total) by mouth daily., Disp: 30 tablet, Rfl: 11 .  gabapentin (NEURONTIN) 100 MG capsule, Take 1 capsule (100 mg total) by mouth 2 (two) times daily., Disp: 180 capsule, Rfl: 1 .  pantoprazole (PROTONIX) 40 MG tablet, Take 1 tablet (40 mg total) by mouth daily., Disp: 90 tablet, Rfl: 1 .  thiamine 100 MG tablet, Take 1 tablet (100 mg total) by mouth daily., Disp: 30 tablet, Rfl: 11  Review of Systems:  Constitutional: Denies fever, chills, diaphoresis, appetite change and fatigue.  HEENT: Denies photophobia, eye pain, redness, hearing loss, ear pain, congestion, sore throat, rhinorrhea, sneezing, mouth sores, trouble swallowing, neck pain, neck stiffness and tinnitus.   Respiratory: Denies SOB, DOE, cough, chest tightness,  and wheezing.   Cardiovascular: Denies chest pain, palpitations and leg swelling.  Gastrointestinal: Denies nausea, vomiting, abdominal pain, diarrhea, constipation, blood in stool and abdominal distention.  Genitourinary: Denies dysuria, urgency, frequency, hematuria, flank pain and difficulty urinating.  Endocrine: Denies: hot or cold intolerance, sweats, changes in hair or nails, polyuria, polydipsia. Musculoskeletal: Denies myalgias,  back pain, joint swelling, arthralgias and gait problem.  Skin: Denies pallor, rash and wound.  Neurological: Denies dizziness, seizures, syncope, weakness, light-headedness, numbness and headaches.  Hematological: Denies adenopathy. Easy bruising, personal or family bleeding history  Psychiatric/Behavioral: Denies  suicidal ideation, mood changes, confusion, nervousness, sleep disturbance and agitation   Assessment and Plan:  B12 deficiency Iron deficiency anemia secondary to inadequate dietary iron intake -Check levels when she returns for physical  Essential hypertension  -Little elevated today per patient report, refill Norvasc and continue to follow blood pressure.  Alcoholism ([place])  - Plan: folic acid (FOLVITE) 1 MG tablet, thiamine 100 MG tablet -She has now quit as of 3 months ago  Diverticulitis of large intestine with abscess without bleeding  -Completed antibiotic therapy, continues follow-up with IR for drain management.   I discussed the assessment and treatment plan with the patient. The patient was provided an opportunity to ask questions and all were answered. The patient agreed with the plan and demonstrated an understanding of the instructions.   The patient was advised to call back or seek an in-person evaluation if the symptoms worsen or if the condition fails to improve as anticipated.  I provided 25 minutes of non-face-to-face time during this encounter.   [name], MD [facility]

## 2019-05-09 DIAGNOSIS — K5792 Diverticulitis of intestine, part unspecified, without perforation or abscess without bleeding: Secondary | ICD-10-CM | POA: Diagnosis not present

## 2019-05-10 ENCOUNTER — Ambulatory Visit
Admission: RE | Admit: 2019-05-10 | Discharge: 2019-05-10 | Disposition: A | Discharge status: Home / Self Care | Payer: PPO | Source: Ambulatory Visit | Attending: General Surgery | Admitting: General Surgery

## 2019-05-10 ENCOUNTER — Telehealth: Payer: Self-pay | Admitting: Internal Medicine

## 2019-05-10 ENCOUNTER — Encounter: Payer: Self-pay | Admitting: Radiology

## 2019-05-10 ENCOUNTER — Other Ambulatory Visit: Payer: Self-pay | Admitting: General Surgery

## 2019-05-10 DIAGNOSIS — K632 Fistula of intestine: Secondary | ICD-10-CM | POA: Diagnosis not present

## 2019-05-10 DIAGNOSIS — K651 Peritoneal abscess: Secondary | ICD-10-CM

## 2019-05-10 DIAGNOSIS — K5780 Diverticulitis of intestine, part unspecified, with perforation and abscess without bleeding: Secondary | ICD-10-CM | POA: Diagnosis not present

## 2019-05-10 HISTORY — PX: IR RADIOLOGIST EVAL & MGMT: IMG5224

## 2019-05-10 NOTE — Progress Notes (Signed)
Chief Complaint: Patient was seen in consultation today for No chief complaint on file.  at the request of Thomas,Alicia  Referring Physician(s): Thomas,Alicia  History of Present Illness: [name] is a 68 y.o. female with a history of acute sigmoid diverticulitis complicated by perforation and diverticular abscess formation.  She was treated with placement of a percutaneous drain on 04/06/2019.  Follow-up CT imaging on 04/12/2019 demonstrated resolution of the address but injection performed the same day demonstrated a fistulous communication to the sigmoid colon.  Subsequently, the catheter has been maintained to gravity bag drainage with flushing.  She has a very small amount of murky output daily.  Her most recent drain injection was performed on 04/26/2019 demonstrating a persistent fistula.  Recently, she has developed pain and burning at the tube insertion site.  At evaluation today, it is evident that her tube has pulled back somewhat due to fracture of the retention suture.  The tube was manipulated and a new adhesive fixation device applied.  [name] affirms near instant relief of the pain and burning following repositioning of the drainage tube.  She denies fever or chills.  No antibiotics at this time.  She saw her surgeon, Dr. [name], last week.  She reports that Dr. Maisie Fus would like to wait things out in the hopes that she will undergo spontaneous healing of her colonic perforation.  She is not an optimal surgical candidate.  Past Medical History:  Diagnosis Date   Alcoholism (HCC) 02/02/2009   ANXIETY 01/21/2007   Blood transfusion    hx of 2005    DIVERTICULOSIS 07/09/2007   Duodenal ulcer hemorrhage perforated 2006   Headache(784.0) 05/31/2007   pt denies at 08/12/11 preop visit    HYPERTENSION 06/23/2007   HYPONATREMIA 02/02/2009   better off diuretic   INSOMNIA 05/31/2007   Iron deficiency anemia    pt denies at 08/12/11 visit     NSAID-induced duodenal ulcer    OSTEOPOROSIS 01/21/2007   Raynaud's syndrome 06/23/2007   Recurrent upper respiratory infection (URI)    pt reports slight cold using Nyquil prn at bedtime    VITAMIN B12 DEFICIENCY 10/02/2009   Zoster 2012   twice - left groin/vagina    Past Surgical History:  Procedure Laterality Date   COLONOSCOPY  06/18/2007   angulated and stenotic sigmoid colon with severe sigmoid diverticulosis   EXPLORATORY LAPAROTOMY  08/03/2004   1) duodenal ulcer oversew, pyloroplasty, anterior, posterior and truncal vagotomies   EXPLORATORY LAPAROTOMY  10/20/2004   1) duodenal ulcer oversew  with exclusion2) side-side gastrojejunostomy 3) feeding jejunostomy   IR RADIOLOGIST EVAL & MGMT  04/26/2019   IR RADIOLOGIST EVAL & MGMT  05/10/2019   IR SINUS/FIST TUBE CHK-NON GI  04/12/2019   LAPAROSCOPY  08/14/2011   Procedure: LAPAROSCOPY DIAGNOSTIC;  Surgeon: Kandis Cocking, MD;  Location: [facility];  Service: General;  Laterality: N/A;   LAPAROTOMY  08/14/2011   Procedure: EXPLORATORY LAPAROTOMY;  Surgeon: Kandis Cocking, MD;  Location: [facility];  Service: General;  Laterality: N/A;  exploratory laparotomy, lysis of adhesions, revision gastrojejunostomy, upper endoscopy   OPEN REDUCTION INTERNAL FIXATION (ORIF) DISTAL RADIAL FRACTURE Right 05/01/2016   Procedure: OPEN REDUCTION INTERNAL FIXATION (ORIF) DISTAL RADIAL FRACTURE, right;  Surgeon: Betha Loa, MD;  Location: [facility];  Service: Orthopedics;  Laterality: Right;   TUBAL LIGATION  1977   UPPER GASTROINTESTINAL ENDOSCOPY  09/16/2007; 12/21/2008; 03/17/2011   2009: Normal 2010: Normal with enteroscopy2012: Gastrojejunal  anastomotic ulcer, gastric retention    Allergies: Sulfa antibiotics, Sulfonamide derivatives, Penicillins, and Hydromorphone hcl  Medications: Prior to Admission medications   Medication Sig Start Date End Date Taking? Authorizing Provider  acetaminophen (TYLENOL) 500 MG tablet Take 500  mg by mouth every 6 (six) hours as needed for mild pain.     [provider]  amLODipine (NORVASC) 10 MG tablet Take 1 tablet (10 mg total) by mouth daily. 05/03/19   Philip Aspen, Limmie Patricia, MD  aspirin 81 MG chewable tablet Chew 1 tablet (81 mg total) by mouth daily. 04/15/19   Ghimire, Werner Lean, MD  feeding supplement, ENSURE ENLIVE, (ENSURE ENLIVE) LIQD Take 237 mLs by mouth 3 (three) times daily between meals. 04/14/19 05/14/19  Ghimire, Werner Lean, MD  folic acid (FOLVITE) 1 MG tablet Take 1 tablet (1 mg total) by mouth daily. 05/03/19   Philip Aspen, Limmie Patricia, MD  gabapentin (NEURONTIN) 100 MG capsule Take 1 capsule (100 mg total) by mouth 2 (two) times daily. 05/03/19 08/01/19  Henderson Cloud, MD  pantoprazole (PROTONIX) 40 MG tablet Take 1 tablet (40 mg total) by mouth daily. 05/03/19 06/02/19  Henderson Cloud, MD  thiamine 100 MG tablet Take 1 tablet (100 mg total) by mouth daily. 05/03/19   Philip Aspen, Limmie Patricia, MD     Family History  Problem Relation Age of Onset   Stroke Father     Social History   Socioeconomic History   Marital status: Married    Spouse name: Not on file   Number of children: 1   Years of education: Not on file   Highest education level: Not on file  Occupational History   Occupation: retired    Associate Professor: UNEMPLOYED  Social Network engineer strain: Not on file   Food insecurity    Worry: Not on file    Inability: Not on file   Transportation needs    Medical: Not on file    Non-medical: Not on file  Tobacco Use   Smoking status: Never Smoker   Smokeless tobacco: Never Used  Substance and Sexual Activity   Alcohol use: Yes    Alcohol/week: 7.0 standard drinks    Types: 7 Cans of beer per week    Comment: 12 oz   Drug use: No   Sexual activity: Not on file  Lifestyle   Physical activity    Days per week: Not on file    Minutes per session: Not on file   Stress: Not on file   Relationships   Social connections    Talks on phone: Not on file    Gets together: Not on file    Attends religious service: Not on file    Active member of club or organization: Not on file    Attends meetings of clubs or organizations: Not on file    Relationship status: Not on file  Other Topics Concern   Not on file  Social History Narrative   Not on file    Review of Systems: A 12 point ROS discussed and pertinent positives are indicated in the HPI above.  All other systems are negative.  Review of Systems  Vital Signs: BP 136/71    Pulse 66    Temp 98 F (36.7 C)    SpO2 97%   Physical Exam Constitutional:      Appearance: Normal appearance.  HENT:     Head: Normocephalic and atraumatic.  Eyes:  General: No scleral icterus. Cardiovascular:     Rate and Rhythm: Normal rate.  Pulmonary:     Effort: Pulmonary effort is normal.  Abdominal:     General: Abdomen is flat. There is no distension.     Palpations: Abdomen is soft.     Tenderness: There is no abdominal tenderness.     Comments: Drain pulled back slightly but site is c/d/i.  Drain repositioned and new adhesive fixation device applied.   Neurological:     Mental Status: She is alert.      Imaging: Ct Abdomen Pelvis W Contrast  Result Date: 04/12/2019 CLINICAL DATA:  Status post percutaneous catheter drainage of large sigmoid diverticular abscess on 04/05/2019. EXAM: CT ABDOMEN AND PELVIS WITH CONTRAST TECHNIQUE: Multidetector CT imaging of the abdomen and pelvis was performed using the standard protocol following bolus administration of intravenous contrast. CONTRAST:  80mL OMNIPAQUE IOHEXOL 300 MG/ML  SOLN COMPARISON:  04/05/2019 FINDINGS: Lower chest: No acute abnormality. Hepatobiliary: No focal liver abnormality is seen. Status post cholecystectomy. No biliary dilatation. Pancreas: Unremarkable. No pancreatic ductal dilatation or surrounding inflammatory changes. Spleen: Normal in size without  focal abnormality. Adrenals/Urinary Tract: Adrenal glands are unremarkable. Kidneys are normal, without renal calculi, focal lesion, or hydronephrosis. Bladder is unremarkable. Stomach/Bowel: Status post prior bowel resection. Probable component of mild colonic ileus. No small bowel dilatation. No free intraperitoneal air is identified. Vascular/Lymphatic: No significant vascular findings are present. No enlarged abdominal or pelvic lymph nodes. Reproductive: Uterus and bilateral adnexa are unremarkable. Other: At the level of the left lower quadrant percutaneous drainage catheter, there is no further liquefied abscess identified by CT. The abscess cavity is completely decompressed. The drainage catheter lies along the inferior aspect of the distal sigmoid colon with numerous diverticula again visualized. Musculoskeletal: No acute or significant osseous findings. IMPRESSION: 1. Complete evacuation of sigmoid diverticular abscess after percutaneous catheter drainage. The left lower quadrant drainage catheter lies along the inferior aspect of the distal sigmoid colon. 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 2. Probable component of mild colonic ileus. No small bowel obstruction or evidence of bowel perforation. Electronically Signed   By: Irish Lack M.D.   On: 04/12/2019 09:59   Ir Sinus/fist Tube Chk-non Gi  Result Date: 04/12/2019 CLINICAL DATA:  History of diverticular abscess, post percutaneous drainage catheter placement 04/05/2019; subsequent CT scan the abdomen and pelvis performed 04/12/2019 demonstrates near complete resolution of the pericolonic diverticular abscess. Patient reports very little output from the percutaneous drainage catheter and as such presents today for drainage catheter injection. EXAM: SINUS TRACT INJECTION/FISTULOGRAM COMPARISON:  CT-guided percutaneous  drainage catheter placement-04/05/2019; CT abdomen and pelvis-04/05/2019; 04/12/2019 CONTRAST:  10mL OMNIPAQUE IOHEXOL 300 MG/ML  SOLN FLUOROSCOPY TIME:  30 seconds (2 mGy) TECHNIQUE: The patient was positioned supine on the fluoroscopy table. A preprocedural spot fluoroscopic image was obtained of the left lower abdomen/pelvis and existing percutaneous drainage catheter Multiple spot fluoroscopic and radiographic images were obtained following the injection of a small amount of contrast via the existing percutaneous drainage catheter. Images reviewed and the decision was made to maintain the percutaneous drainage catheter. As such, the drainage catheter was flushed with a small of saline and converted from a JP bulb to a gravity bag. A dressing was applied. The patient tolerated the procedure well without immediate postprocedural complication. FINDINGS: Preprocedural spot fluoroscopic image demonstrates unchanged positioning pre-existing percutaneous drainage catheter with end  coiled and locked over the left lower abdomen/pelvis. A small amount of excreted contrast from recent CT scan is seen within the urinary bladder Contrast injection demonstrates opacification of decompressed abscess cavity with fistulous connection between the residual collection in the adjacent sigmoid colon. 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 PLAN: - The patient's percutaneous drainage catheter was converted from a JP bulb to a gravity bag. - The patient was instructed to no longer flush the percutaneous drainage catheter however to maintain diligent records with getting daily drainage catheter output - As the patient is impending discharge, she will present to the interventional radiology drain clinic in 2 weeks for drainage catheter evaluation and management and repeat drainage catheter injection. - note, CT imaging is not required  (unless patient has recurrence of abdominal pain or fever), as CT scan performed 04/12/2019 demonstrated resolution of the pericolonic diverticular abscess. Electronically Signed   By: Sandi Mariscal M.D.   On: 04/12/2019 15:42   Dg Sinus/fist Tube Chk-non Gi  Result Date: 04/26/2019 CLINICAL DATA:  Intra-abdominal abscess ([place]), post percutaneous drain catheter placement 04/05/2019. 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 Questions regarding the procedure were encouraged and answered. The patient understands and consents to the procedure. Survey fluoroscopic inspection reveals stable appearance of left pelvic percutaneous pigtail drain catheter. Injection demonstrates little significant residual abscess cavity. There is a short fistula to the distal sigmoid colon confirmed on oblique projections. IMPRESSION: 1. Near complete resolution of abscess cavity with persistent fistula to the sigmoid colon. Electronically Signed   By: Lucrezia Europe M.D.   On: 04/26/2019 13:25   Ir Radiologist Eval & Mgmt  Result Date: 05/10/2019 Please refer to notes tab for details about interventional procedure. (Op Note)  Ir Radiologist Eval & Mgmt  Result Date: 04/26/2019 Please refer to notes tab for details about interventional procedure. (Op Note)   Labs:  CBC: Recent Labs    04/08/19 0219 04/09/19 0452 04/10/19 0423 04/11/19 0428  WBC 8.1 11.9* 9.9 8.2  HGB 14.0 13.1 13.0 12.0  HCT 37.8 36.1 36.9 34.2*  PLT 379 375 391 387    COAGS: Recent Labs    04/06/19 0404  INR 1.2  APTT 40*    BMP: Recent Labs    04/10/19 0423 04/11/19 0428 04/12/19 0359 04/14/19 0321  NA 130* 130* 134* 129*  K 3.9 3.7 4.1 4.5  CL 99 96* 101 91*  CO2 25 26 26 28   GLUCOSE 130* 122* 99  106*  BUN 6* 9 10 12   CALCIUM 7.9* 8.0* 8.4* 8.6*  CREATININE 0.38* 0.35* 0.51 0.45  GFRNONAA >60 >60 >60 >60  GFRAA >60 >60 >60 >60    LIVER FUNCTION TESTS: Recent Labs    04/09/19 0452 04/10/19 0423 04/11/19 0428 04/14/19 0321  BILITOT 0.3 0.2* <0.1* 0.4  AST 21 26 37 25  ALT 15 16 22 21   ALKPHOS 68 69 64 73  PROT 5.5* 5.9* 5.2* 5.4*  ALBUMIN 2.6* 2.6* 2.6* 2.8*    TUMOR MARKERS: No results for input(s): AFPTM, CEA, CA199, CHROMGRNA in the last 8760 hours.  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  The drainage catheter had pulled back slightly but was successfully repositioned and a new adhesive fixation device applied.  The difference between fistula and abscess was explained in detail to the patient and her husband.  He voiced his understanding.  All questions were answered.  They understand that both [name] and Dr. Maisie Fushomas want to avoid surgery at all costs.  Therefore, we will continue with expectant management for the foreseeable future.  1.)  Husband instructed to stop forward flushes. 2.) Return clinic visit and drain injection in 1 month.    Electronically Signed: Malachy MoanHeath Fritz Cauthon 05/10/2019, 1:48 PM   I spent a total of   15 Minutes in face to face in clinical consultation, greater than 50% of which was counseling/coordinating care for diverticular abscess.

## 2019-05-19 ENCOUNTER — Encounter: Payer: PPO | Admitting: Internal Medicine

## 2019-06-03 ENCOUNTER — Encounter: Payer: Self-pay | Admitting: Internal Medicine

## 2019-06-03 ENCOUNTER — Other Ambulatory Visit: Payer: Self-pay | Admitting: Internal Medicine

## 2019-06-03 ENCOUNTER — Other Ambulatory Visit: Payer: Self-pay

## 2019-06-03 ENCOUNTER — Ambulatory Visit (INDEPENDENT_AMBULATORY_CARE_PROVIDER_SITE_OTHER): Payer: PPO | Admitting: Internal Medicine

## 2019-06-03 VITALS — BP 110/68 | HR 58 | Temp 97.4°F | Ht 63.0 in | Wt 118.9 lb

## 2019-06-03 DIAGNOSIS — H6121 Impacted cerumen, right ear: Secondary | ICD-10-CM | POA: Diagnosis not present

## 2019-06-03 DIAGNOSIS — I10 Essential (primary) hypertension: Secondary | ICD-10-CM

## 2019-06-03 DIAGNOSIS — Z23 Encounter for immunization: Secondary | ICD-10-CM

## 2019-06-03 DIAGNOSIS — Z13820 Encounter for screening for osteoporosis: Secondary | ICD-10-CM

## 2019-06-03 DIAGNOSIS — E559 Vitamin D deficiency, unspecified: Secondary | ICD-10-CM

## 2019-06-03 DIAGNOSIS — E538 Deficiency of other specified B group vitamins: Secondary | ICD-10-CM | POA: Diagnosis not present

## 2019-06-03 DIAGNOSIS — K5720 Diverticulitis of large intestine with perforation and abscess without bleeding: Secondary | ICD-10-CM

## 2019-06-03 DIAGNOSIS — Z1239 Encounter for other screening for malignant neoplasm of breast: Secondary | ICD-10-CM

## 2019-06-03 DIAGNOSIS — Z780 Asymptomatic menopausal state: Secondary | ICD-10-CM | POA: Diagnosis not present

## 2019-06-03 DIAGNOSIS — D508 Other iron deficiency anemias: Secondary | ICD-10-CM

## 2019-06-03 DIAGNOSIS — F1020 Alcohol dependence, uncomplicated: Secondary | ICD-10-CM | POA: Diagnosis not present

## 2019-06-03 DIAGNOSIS — E440 Moderate protein-calorie malnutrition: Secondary | ICD-10-CM | POA: Diagnosis not present

## 2019-06-03 DIAGNOSIS — Z0000 Encounter for general adult medical examination without abnormal findings: Secondary | ICD-10-CM

## 2019-06-03 LAB — CBC WITH DIFFERENTIAL/PLATELET
Basophils Absolute: 0 10*3/uL (ref 0.0–0.1)
Basophils Relative: 0.6 % (ref 0.0–3.0)
Eosinophils Absolute: 0.1 10*3/uL (ref 0.0–0.7)
Eosinophils Relative: 1.2 % (ref 0.0–5.0)
HCT: 39.4 % (ref 36.0–46.0)
Hemoglobin: 13.6 g/dL (ref 12.0–15.0)
Lymphocytes Relative: 19.7 % (ref 12.0–46.0)
Lymphs Abs: 1.5 10*3/uL (ref 0.7–4.0)
MCHC: 34.6 g/dL (ref 30.0–36.0)
MCV: 98.1 fl (ref 78.0–100.0)
Monocytes Absolute: 0.6 10*3/uL (ref 0.1–1.0)
Monocytes Relative: 7.9 % (ref 3.0–12.0)
Neutro Abs: 5.3 10*3/uL (ref 1.4–7.7)
Neutrophils Relative %: 70.6 % (ref 43.0–77.0)
Platelets: 279 10*3/uL (ref 150.0–400.0)
RBC: 4.01 Mil/uL (ref 3.87–5.11)
RDW: 13.5 % (ref 11.5–15.5)
WBC: 7.5 10*3/uL (ref 4.0–10.5)

## 2019-06-03 LAB — COMPREHENSIVE METABOLIC PANEL
ALT: 28 U/L (ref 0–35)
AST: 21 U/L (ref 0–37)
Albumin: 4.4 g/dL (ref 3.5–5.2)
Alkaline Phosphatase: 99 U/L (ref 39–117)
BUN: 14 mg/dL (ref 6–23)
CO2: 31 mEq/L (ref 19–32)
Calcium: 9.5 mg/dL (ref 8.4–10.5)
Chloride: 95 mEq/L — ABNORMAL LOW (ref 96–112)
Creatinine, Ser: 0.58 mg/dL (ref 0.40–1.20)
GFR: 103.61 mL/min (ref >60.00)
Glucose, Bld: 109 mg/dL — ABNORMAL HIGH (ref 70–99)
Potassium: 5.1 mEq/L (ref 3.5–5.1)
Sodium: 135 mEq/L (ref 135–145)
Total Bilirubin: 0.6 mg/dL (ref 0.2–1.2)
Total Protein: 6.7 g/dL (ref 6.0–8.3)

## 2019-06-03 LAB — LIPID PANEL
Cholesterol: 219 mg/dL — ABNORMAL HIGH (ref 0–200)
HDL: 62.6 mg/dL (ref >39.00)
LDL Cholesterol: 132 mg/dL — ABNORMAL HIGH (ref 0–99)
NonHDL: 156.76
Total CHOL/HDL Ratio: 4
Triglycerides: 122 mg/dL (ref 0.0–149.0)
VLDL: 24.4 mg/dL (ref 0.0–40.0)

## 2019-06-03 LAB — VITAMIN D 25 HYDROXY (VIT D DEFICIENCY, FRACTURES): VITD: 22.43 ng/mL — ABNORMAL LOW (ref 30.00–100.00)

## 2019-06-03 LAB — HEMOGLOBIN A1C: Hgb A1c MFr Bld: 5.6 % (ref 4.6–6.5)

## 2019-06-03 LAB — TSH: TSH: 1.85 u[IU]/mL (ref 0.35–4.50)

## 2019-06-03 MED ORDER — VITAMIN D (ERGOCALCIFEROL) 1.25 MG (50000 UNIT) PO CAPS: 50000.0000 [IU] | ORAL_CAPSULE | ORAL | 0 refills | Status: AC

## 2019-06-03 NOTE — Addendum Note (Signed)
Addended by: Westley Hummer B on: 06/03/2019 12:03 PM   Modules accepted: Orders

## 2019-06-03 NOTE — Patient Instructions (Signed)
-Nice seeing you today!!  -Lab work today; will notify you once results are available.  -Mammogram and DEXA scan requested today.  -Remember to follow up with GI for your colonoscopy.  -Please schedule your eye and dental exams.  -Pneumonia vaccine today.  -Schedule follow up in 1 month for your pelvic exam.   Preventive Care 67 Years and Older, Female Preventive care refers to lifestyle choices and visits with your health care provider that can promote health and wellness. This includes:  A yearly physical exam. This is also called an annual well check.  Regular dental and eye exams.  Immunizations.  Screening for certain conditions.  Healthy lifestyle choices, such as diet and exercise. What can I expect for my preventive care visit? Physical exam Your health care provider will check:  Height and weight. These may be used to calculate body mass index (BMI), which is a measurement that tells if you are at a healthy weight.  Heart rate and blood pressure.  Your skin for abnormal spots. Counseling Your health care provider may ask you questions about:  Alcohol, tobacco, and drug use.  Emotional well-being.  Home and relationship well-being.  Sexual activity.  Eating habits.  History of falls.  Memory and ability to understand (cognition).  Work and work Statistician.  Pregnancy and menstrual history. What immunizations do I need?  Influenza (flu) vaccine  This is recommended every year. Tetanus, diphtheria, and pertussis (Tdap) vaccine  You may need a Td booster every 10 years. Varicella (chickenpox) vaccine  You may need this vaccine if you have not already been vaccinated. Zoster (shingles) vaccine  You may need this after age 67. Pneumococcal conjugate (PCV13) vaccine  One dose is recommended after age 35. Pneumococcal polysaccharide (PPSV23) vaccine  One dose is recommended after age 67. Measles, mumps, and rubella (MMR) vaccine  You may  need at least one dose of MMR if you were born in 1957 or later. You may also need a second dose. Meningococcal conjugate (MenACWY) vaccine  You may need this if you have certain conditions. Hepatitis A vaccine  You may need this if you have certain conditions or if you travel or work in places where you may be exposed to hepatitis A. Hepatitis B vaccine  You may need this if you have certain conditions or if you travel or work in places where you may be exposed to hepatitis B. Haemophilus influenzae type b (Hib) vaccine  You may need this if you have certain conditions. You may receive vaccines as individual doses or as more than one vaccine together in one shot (combination vaccines). Talk with your health care provider about the risks and benefits of combination vaccines. What tests do I need? Blood tests  Lipid and cholesterol levels. These may be checked every 5 years, or more frequently depending on your overall health.  Hepatitis C test.  Hepatitis B test. Screening  Lung cancer screening. You may have this screening every year starting at age 67 if you have a 30-pack-year history of smoking and currently smoke or have quit within the past 15 years.  Colorectal cancer screening. All adults should have this screening starting at age 67 and continuing until age 45. Your health care provider may recommend screening at age 54 if you are at increased risk. You will have tests every 1-10 years, depending on your results and the type of screening test.  Diabetes screening. This is done by checking your blood sugar (glucose) after you have not  eaten for a while (fasting). You may have this done every 1-3 years.  Mammogram. This may be done every 1-2 years. Talk with your health care provider about how often you should have regular mammograms.  BRCA-related cancer screening. This may be done if you have a family history of breast, ovarian, tubal, or peritoneal cancers. Other tests   Sexually transmitted disease (STD) testing.  Bone density scan. This is done to screen for osteoporosis. You may have this done starting at age 67. Follow these instructions at home: Eating and drinking  Eat a diet that includes fresh fruits and vegetables, whole grains, lean protein, and low-fat dairy products. Limit your intake of foods with high amounts of sugar, saturated fats, and salt.  Take vitamin and mineral supplements as recommended by your health care provider.  Do not drink alcohol if your health care provider tells you not to drink.  If you drink alcohol: ? Limit how much you have to 0-1 drink a day. ? Be aware of how much alcohol is in your drink. In the [place] one drink equals one 12 oz bottle of beer (355 mL), one 5 oz glass of wine (148 mL), or one 1 oz glass of hard liquor (44 mL). Lifestyle  Take daily care of your teeth and gums.  Stay active. Exercise for at least 30 minutes on 5 or more days each week.  Do not use any products that contain nicotine or tobacco, such as cigarettes, e-cigarettes, and chewing tobacco. If you need help quitting, ask your health care provider.  If you are sexually active, practice safe sex. Use a condom or other form of protection in order to prevent STIs (sexually transmitted infections).  Talk with your health care provider about taking a low-dose aspirin or statin. What's next?  Go to your health care provider once a year for a well check visit.  Ask your health care provider how often you should have your eyes and teeth checked.  Stay up to date on all vaccines. This information is not intended to replace advice given to you by your health care provider. Make sure you discuss any questions you have with your health care provider. Document Released: 08/03/2015 Document Revised: 07/01/2018 Document Reviewed: 07/01/2018 Elsevier Patient Education  2020 [employer].

## 2019-06-03 NOTE — Progress Notes (Signed)
Established Patient Office Visit     CC/Reason for Visit: Annual preventive exam and subsequent Medicare wellness visit  HPI: [name] is a 68 y.o. female who is coming in today for the above mentioned reasons. Past Medical History is significant for: Hypertension, peptic ulcer disease, osteoporosis and alcohol abuse.  She was hospitalized in mid to late September with a diverticular abscess with fistula formation.  She has completed antibiotic therapy.  She still has a drain in place and is following with the IR drain clinic.  She was an alcoholic until about 3 months ago.  She was drinking 6 pack of beer a day.  She is accompanied by her husband during this visit today who provides most of her history.  She has routine dental care but no eye care, she is due for Pneumovax today, has not had any cancer screening.  She has gained 5 pounds in about 3 weeks.   Past Medical/Surgical History: Past Medical History:  Diagnosis Date  . Alcoholism ([place]) 02/02/2009  . ANXIETY 01/21/2007  . Blood transfusion    hx of 2005   . DIVERTICULOSIS 07/09/2007  . Duodenal ulcer hemorrhage perforated 2006  . Headache(784.0) 05/31/2007   pt denies at 08/12/11 preop visit   . HYPERTENSION 06/23/2007  . HYPONATREMIA 02/02/2009   better off diuretic  . INSOMNIA 05/31/2007  . Iron deficiency anemia    pt denies at 08/12/11 visit   . NSAID-induced duodenal ulcer   . OSTEOPOROSIS 01/21/2007  . Raynaud's syndrome 06/23/2007  . Recurrent upper respiratory infection (URI)    pt reports slight cold using Nyquil prn at bedtime   . VITAMIN B12 DEFICIENCY 10/02/2009  . Zoster 2012   twice - left groin/vagina    Past Surgical History:  Procedure Laterality Date  . COLONOSCOPY  06/18/2007   angulated and stenotic sigmoid colon with severe sigmoid diverticulosis  . EXPLORATORY LAPAROTOMY  08/03/2004   1) duodenal ulcer oversew, pyloroplasty, anterior, posterior and truncal vagotomies  . EXPLORATORY LAPAROTOMY   10/20/2004   1) duodenal ulcer oversew  with exclusion2) side-side gastrojejunostomy 3) feeding jejunostomy  . IR RADIOLOGIST EVAL & MGMT  04/26/2019  . IR RADIOLOGIST EVAL & MGMT  05/10/2019  . IR SINUS/FIST TUBE CHK-NON GI  04/12/2019  . LAPAROSCOPY  08/14/2011   Procedure: LAPAROSCOPY DIAGNOSTIC;  Surgeon: Shann Medal, MD;  Location: [facility];  Service: General;  Laterality: N/A;  . LAPAROTOMY  08/14/2011   Procedure: EXPLORATORY LAPAROTOMY;  Surgeon: Shann Medal, MD;  Location: [facility];  Service: General;  Laterality: N/A;  exploratory laparotomy, lysis of adhesions, revision gastrojejunostomy, upper endoscopy  . OPEN REDUCTION INTERNAL FIXATION (ORIF) DISTAL RADIAL FRACTURE Right 05/01/2016   Procedure: OPEN REDUCTION INTERNAL FIXATION (ORIF) DISTAL RADIAL FRACTURE, right;  Surgeon: Leanora Cover, MD;  Location: [facility];  Service: Orthopedics;  Laterality: Right;  . TUBAL LIGATION  1977  . UPPER GASTROINTESTINAL ENDOSCOPY  09/16/2007; 12/21/2008; 03/17/2011   2009: Normal 2010: Normal with enteroscopy2012: Gastrojejunal  anastomotic ulcer, gastric retention    Social History:  reports that she has never smoked. She has never used smokeless tobacco. She reports current alcohol use of about 7.0 standard drinks of alcohol per week. She reports that she does not use drugs.  Allergies: Allergies  Allergen Reactions  . Sulfa Antibiotics Anaphylaxis and Rash  . Sulfonamide Derivatives Anaphylaxis and Rash  . Dilaudid [Hydromorphone Hcl] Hives  . Penicillins Other (See Comments)    Has  patient had a PCN reaction causing immediate rash, facial/tongue/throat swelling, SOB or lightheadedness with hypotension: No Has patient had a PCN reaction causing severe rash involving mucus membranes or skin necrosis: No Has patient had a PCN reaction that required hospitalization No Has patient had a PCN reaction occurring within the last 10 years: No If all of the above answers are "NO", then  may proceed with Cephalosporin use.  Marland Kitchen Hydromorphone Hcl Rash    Family History:  Family History  Problem Relation Age of Onset  . Stroke Father      Current Outpatient Medications:  .  acetaminophen (TYLENOL) 500 MG tablet, Take 500 mg by mouth every 6 (six) hours as needed for mild pain. , Disp: , Rfl:  .  amLODipine (NORVASC) 10 MG tablet, Take 1 tablet (10 mg total) by mouth daily., Disp: 90 tablet, Rfl: 1 .  aspirin 81 MG chewable tablet, Chew 1 tablet (81 mg total) by mouth daily., Disp:  , Rfl:  .  folic acid (FOLVITE) 1 MG tablet, Take 1 tablet (1 mg total) by mouth daily., Disp: 30 tablet, Rfl: 11 .  gabapentin (NEURONTIN) 100 MG capsule, Take 1 capsule (100 mg total) by mouth 2 (two) times daily., Disp: 180 capsule, Rfl: 1 .  pantoprazole (PROTONIX) 40 MG tablet, Take 1 tablet (40 mg total) by mouth daily., Disp: 90 tablet, Rfl: 1 .  thiamine 100 MG tablet, Take 1 tablet (100 mg total) by mouth daily., Disp: 30 tablet, Rfl: 11  Review of Systems:  Constitutional: Denies fever, chills, diaphoresis, appetite change and fatigue.  HEENT: Denies photophobia, eye pain, redness, hearing loss, ear pain, congestion, sore throat, rhinorrhea, sneezing, mouth sores, trouble swallowing, neck pain, neck stiffness and tinnitus.   Respiratory: Denies SOB, DOE, cough, chest tightness,  and wheezing.   Cardiovascular: Denies chest pain, palpitations and leg swelling.  Gastrointestinal: Denies nausea, vomiting, abdominal pain, diarrhea, constipation, blood in stool and abdominal distention.  Genitourinary: Denies dysuria, urgency, frequency, hematuria, flank pain and difficulty urinating.  Endocrine: Denies: hot or cold intolerance, sweats, changes in hair or nails, polyuria, polydipsia. Musculoskeletal: Denies myalgias, back pain, joint swelling, arthralgias and gait problem.  Skin: Denies pallor, rash and wound.  Neurological: Denies dizziness, seizures, syncope, weakness, light-headedness,  numbness and headaches.  Hematological: Denies adenopathy. Easy bruising, personal or family bleeding history  Psychiatric/Behavioral: Denies suicidal ideation, mood changes, confusion, nervousness, sleep disturbance and agitation    Physical Exam: Vitals:   06/03/19 0706  BP: 110/68  Pulse: (!) 58  Temp: (!) 97.4 F (36.3 C)  TempSrc: Temporal  SpO2: 99%  Weight: 118 lb 14.4 oz (53.9 kg)  Height: '5\' 3"'$  (1.6 m)    Body mass index is 21.06 kg/m.   Constitutional: NAD, calm, comfortable Eyes: PERRL, lids and conjunctivae normal ENMT: Mucous membranes are moist. Posterior pharynx clear of any exudate or lesions. Normal dentition. Tympanic membrane is pearly white, no erythema or bulging. Neck: normal, supple, no masses, no thyromegaly Respiratory: clear to auscultation bilaterally, no wheezing, no crackles. Normal respiratory effort. No accessory muscle use.  Cardiovascular: Regular rate and rhythm, no murmurs / rubs / gallops. No extremity edema. 2+ pedal pulses. No carotid bruits.  Abdomen: no tenderness, no masses palpated. No hepatosplenomegaly. Bowel sounds positive.  Musculoskeletal: no clubbing / cyanosis. No joint deformity upper and lower extremities. Good ROM, no contractures. Normal muscle tone.  Skin: no rashes, lesions, ulcers. No induration Neurologic: CN 2-12 grossly intact. Sensation intact, DTR normal. Strength 5/5 in  all 4.  Psychiatric: Normal judgment and insight. Alert and oriented x 3. Normal mood.    Subsequent Medicare wellness visit   1. Risk factors, based on past  M,S,F -cardiovascular disease risk factors include age, history of hypertension   2.  Physical activities: Used to walk about a mile a day, none since her hospitalization in September   3.  Depression/mood:  Stable, not depressed   4.  Hearing:  No perceived issues   5.  ADL's: Independent in all ADLs   6.  Fall risk:  Moderate fall risk   7.  Home safety: No problems identified    8.  Height weight, and visual acuity: Height and weight as above, visual acuity is 20/32 with each eye independently and together   9.  Counseling:  She will return in 1 month for pelvic exam   10. Lab orders based on risk factors: Laboratory update will be reviewed   11. Referral :  GI for screening colonoscopy, ophthalmology.   12. Care plan:  Arrange for cancer screening, follow-up with me as scheduled   13. Cognitive assessment:  Mild cognitive impairment   14. Screening: Patient provided with a written and personalized 5-10 year screening schedule in the AVS.   yes   15. Provider List Update:   PCP, IR drain clinic  59. Advance Directives: Full code     Fall Risk  04/11/2014 09/12/2013  Falls in the past year? Yes No  Number falls in past yr: 1 -  Injury with Fall? No -  Risk for fall due to : Impaired balance/gait -     Impression and Plan:  Encounter for preventive health examination  -She has routine dental care, needs eye care. -Pneumovax today, defer shingles per patient request, otherwise immunizations are up-to-date. -Screening labs today. -Healthy lifestyle has been discussed in detail. -Refer to GI for screening colonoscopy. -We will request mammogram. -We will send for DEXA scan. -She will return in 1 month for pelvic exam and Pap smear for cervical cancer screening as we have run out of time today.  Diverticulitis of large intestine with abscess without bleeding  -Still with drain in place and following with IR clinic.  Malnutrition of moderate degree  -With good nutrition, she is able to increase 5 pounds in the last month, check prealbumin per husband's request.  B12 deficiency  - Plan: Anemia panel  Iron deficiency anemia secondary to inadequate dietary iron intake  - Plan: Anemia panel  Essential hypertension  -Well-controlled.  Alcoholism ([place]) -She quit smoking as of approximately 3 months ago.  Continue thiamine, folate  Hearing loss of  right ear due to cerumen impaction -Cerumen Desimpaction  After obtaining patient consent, warm water was applied and gentle ear lavage performed on right ear. There were no complications and following the desimpaction the tympanic membranes were visible. Tympanic membranes are intact following the procedure. Auditory canals are normal. The patient reported relief of symptoms after removal of cerumen.    Patient Instructions  -Nice seeing you today!!  -Lab work today; will notify you once results are available.  -Mammogram and DEXA scan requested today.  -Remember to follow up with GI for your colonoscopy.  -Please schedule your eye and dental exams.  -Pneumonia vaccine today.  -Schedule follow up in 1 month for your pelvic exam.   Preventive Care 65 Years and Older, Female Preventive care refers to lifestyle choices and visits with your health care provider that can promote health and  wellness. This includes:  A yearly physical exam. This is also called an annual well check.  Regular dental and eye exams.  Immunizations.  Screening for certain conditions.  Healthy lifestyle choices, such as diet and exercise. What can I expect for my preventive care visit? Physical exam Your health care provider will check:  Height and weight. These may be used to calculate body mass index (BMI), which is a measurement that tells if you are at a healthy weight.  Heart rate and blood pressure.  Your skin for abnormal spots. Counseling Your health care provider may ask you questions about:  Alcohol, tobacco, and drug use.  Emotional well-being.  Home and relationship well-being.  Sexual activity.  Eating habits.  History of falls.  Memory and ability to understand (cognition).  Work and work Statistician.  Pregnancy and menstrual history. What immunizations do I need?  Influenza (flu) vaccine  This is recommended every year. Tetanus, diphtheria, and pertussis (Tdap)  vaccine  You may need a Td booster every 10 years. Varicella (chickenpox) vaccine  You may need this vaccine if you have not already been vaccinated. Zoster (shingles) vaccine  You may need this after age 7. Pneumococcal conjugate (PCV13) vaccine  One dose is recommended after age 9. Pneumococcal polysaccharide (PPSV23) vaccine  One dose is recommended after age 5. Measles, mumps, and rubella (MMR) vaccine  You may need at least one dose of MMR if you were born in 1957 or later. You may also need a second dose. Meningococcal conjugate (MenACWY) vaccine  You may need this if you have certain conditions. Hepatitis A vaccine  You may need this if you have certain conditions or if you travel or work in places where you may be exposed to hepatitis A. Hepatitis B vaccine  You may need this if you have certain conditions or if you travel or work in places where you may be exposed to hepatitis B. Haemophilus influenzae type b (Hib) vaccine  You may need this if you have certain conditions. You may receive vaccines as individual doses or as more than one vaccine together in one shot (combination vaccines). Talk with your health care provider about the risks and benefits of combination vaccines. What tests do I need? Blood tests  Lipid and cholesterol levels. These may be checked every 5 years, or more frequently depending on your overall health.  Hepatitis C test.  Hepatitis B test. Screening  Lung cancer screening. You may have this screening every year starting at age 67 if you have a 30-pack-year history of smoking and currently smoke or have quit within the past 15 years.  Colorectal cancer screening. All adults should have this screening starting at age 38 and continuing until age 35. Your health care provider may recommend screening at age 60 if you are at increased risk. You will have tests every 1-10 years, depending on your results and the type of screening test.   Diabetes screening. This is done by checking your blood sugar (glucose) after you have not eaten for a while (fasting). You may have this done every 1-3 years.  Mammogram. This may be done every 1-2 years. Talk with your health care provider about how often you should have regular mammograms.  BRCA-related cancer screening. This may be done if you have a family history of breast, ovarian, tubal, or peritoneal cancers. Other tests  Sexually transmitted disease (STD) testing.  Bone density scan. This is done to screen for osteoporosis. You may have this done  starting at age 83. Follow these instructions at home: Eating and drinking  Eat a diet that includes fresh fruits and vegetables, whole grains, lean protein, and low-fat dairy products. Limit your intake of foods with high amounts of sugar, saturated fats, and salt.  Take vitamin and mineral supplements as recommended by your health care provider.  Do not drink alcohol if your health care provider tells you not to drink.  If you drink alcohol: ? Limit how much you have to 0-1 drink a day. ? Be aware of how much alcohol is in your drink. In the [place] one drink equals one 12 oz bottle of beer (355 mL), one 5 oz glass of wine (148 mL), or one 1 oz glass of hard liquor (44 mL). Lifestyle  Take daily care of your teeth and gums.  Stay active. Exercise for at least 30 minutes on 5 or more days each week.  Do not use any products that contain nicotine or tobacco, such as cigarettes, e-cigarettes, and chewing tobacco. If you need help quitting, ask your health care provider.  If you are sexually active, practice safe sex. Use a condom or other form of protection in order to prevent STIs (sexually transmitted infections).  Talk with your health care provider about taking a low-dose aspirin or statin. What's next?  Go to your health care provider once a year for a well check visit.  Ask your health care provider how often you should  have your eyes and teeth checked.  Stay up to date on all vaccines. This information is not intended to replace advice given to you by your health care provider. Make sure you discuss any questions you have with your health care provider. Document Released: 08/03/2015 Document Revised: 07/01/2018 Document Reviewed: 07/01/2018 Elsevier Patient Education  2020 [place] [facility]

## 2019-06-04 LAB — PREALBUMIN: Prealbumin: 27 mg/dL (ref 17–34)

## 2019-06-05 LAB — ANEMIA PANEL
Ferritin: 221 ng/mL — ABNORMAL HIGH (ref 15–150)
Folate, Hemolysate: 476 ng/mL
Folate, RBC: 1190 ng/mL (ref >498)
Hematocrit: 40 % (ref 34.0–46.6)
Iron Saturation: 41 % (ref 15–55)
Iron: 123 ug/dL (ref 27–139)
Retic Ct Pct: 1.4 % (ref 0.6–2.6)
Total Iron Binding Capacity: 302 ug/dL (ref 250–450)
UIBC: 179 ug/dL (ref 118–369)
Vitamin B-12: 349 pg/mL (ref 232–1245)

## 2019-06-07 ENCOUNTER — Other Ambulatory Visit: Payer: Self-pay

## 2019-06-07 ENCOUNTER — Ambulatory Visit (INDEPENDENT_AMBULATORY_CARE_PROVIDER_SITE_OTHER)
Admission: RE | Admit: 2019-06-07 | Discharge: 2019-06-07 | Disposition: A | Discharge status: Home / Self Care | Payer: PPO | Source: Ambulatory Visit | Attending: Internal Medicine | Admitting: Internal Medicine

## 2019-06-07 ENCOUNTER — Ambulatory Visit
Admission: RE | Admit: 2019-06-07 | Discharge: 2019-06-07 | Disposition: A | Discharge status: Home / Self Care | Payer: PPO | Source: Ambulatory Visit | Attending: General Surgery | Admitting: General Surgery

## 2019-06-07 DIAGNOSIS — K651 Peritoneal abscess: Secondary | ICD-10-CM

## 2019-06-07 DIAGNOSIS — Z780 Asymptomatic menopausal state: Secondary | ICD-10-CM

## 2019-06-07 DIAGNOSIS — Z13820 Encounter for screening for osteoporosis: Secondary | ICD-10-CM

## 2019-06-07 DIAGNOSIS — K5730 Diverticulosis of large intestine without perforation or abscess without bleeding: Secondary | ICD-10-CM | POA: Diagnosis not present

## 2019-06-07 HISTORY — PX: IR RADIOLOGIST EVAL & MGMT: IMG5224

## 2019-06-07 NOTE — Progress Notes (Signed)
Referring Physician(s): Thomas,Alicia  Chief Complaint: The patient is seen in follow up today s/p sigmoid colonic diverticulum and abscess. 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 At that time drain tube was repositioned slightly - pt was complaining of pain at site. New stat lock placed that day Has stopped flushing OP minimal at best--- yellow brown color in bag Denies pain; fever; chills  Antibiotics are finished  No follow up appt with Dr Joyice Faster as of yet  Injection scheduled again today    Past Medical History:  Diagnosis Date  . Alcoholism ([place]) 02/02/2009  . ANXIETY 01/21/2007  . Blood transfusion    hx of 2005   . DIVERTICULOSIS 07/09/2007  . Duodenal ulcer hemorrhage perforated 2006  . Headache(784.0) 05/31/2007   pt denies at 08/12/11 preop visit   . HYPERTENSION 06/23/2007  . HYPONATREMIA 02/02/2009   better off diuretic  . INSOMNIA 05/31/2007  . Iron deficiency anemia    pt denies at 08/12/11 visit   . NSAID-induced duodenal ulcer   . OSTEOPOROSIS 01/21/2007  . Raynaud's syndrome 06/23/2007  . Recurrent upper respiratory infection (URI)    pt reports slight cold using Nyquil prn at bedtime   . VITAMIN B12 DEFICIENCY 10/02/2009  . Zoster 2012   twice - left groin/vagina    Past Surgical History:  Procedure Laterality Date  . COLONOSCOPY  06/18/2007   angulated and stenotic sigmoid colon with severe sigmoid diverticulosis  . EXPLORATORY LAPAROTOMY  08/03/2004   1) duodenal ulcer oversew, pyloroplasty, anterior, posterior and truncal vagotomies  . EXPLORATORY LAPAROTOMY  10/20/2004   1) duodenal ulcer oversew  with exclusion2) side-side gastrojejunostomy 3) feeding jejunostomy  . IR RADIOLOGIST EVAL & MGMT  04/26/2019  . IR RADIOLOGIST EVAL & MGMT  05/10/2019  . IR SINUS/FIST TUBE CHK-NON GI  04/12/2019  . LAPAROSCOPY   08/14/2011   Procedure: LAPAROSCOPY DIAGNOSTIC;  Surgeon: Shann Medal, MD;  Location: [facility];  Service: General;  Laterality: N/A;  . LAPAROTOMY  08/14/2011   Procedure: EXPLORATORY LAPAROTOMY;  Surgeon: Shann Medal, MD;  Location: [facility];  Service: General;  Laterality: N/A;  exploratory laparotomy, lysis of adhesions, revision gastrojejunostomy, upper endoscopy  . OPEN REDUCTION INTERNAL FIXATION (ORIF) DISTAL RADIAL FRACTURE Right 05/01/2016   Procedure: OPEN REDUCTION INTERNAL FIXATION (ORIF) DISTAL RADIAL FRACTURE, right;  Surgeon: Leanora Cover, MD;  Location: [place];  Service: Orthopedics;  Laterality: Right;  . TUBAL LIGATION  1977  . UPPER GASTROINTESTINAL ENDOSCOPY  09/16/2007; 12/21/2008; 03/17/2011   2009: Normal 2010: Normal with enteroscopy2012: Gastrojejunal  anastomotic ulcer, gastric retention    Allergies: Sulfa antibiotics, Sulfonamide derivatives, Dilaudid [hydromorphone hcl], Penicillins, and Hydromorphone hcl  Medications: Prior to Admission medications   Medication Sig Start Date End Date Taking? Authorizing Provider  acetaminophen (TYLENOL) 500 MG tablet Take 500 mg by mouth every 6 (six) hours as needed for mild pain.     [provider]  amLODipine (NORVASC) 10 MG tablet Take 1 tablet (10 mg total) by mouth daily. 05/03/19   Isaac Bliss, Rayford Halsted, MD  aspirin 81 MG chewable tablet Chew 1 tablet (81 mg total) by mouth daily. 04/15/19   Ghimire, Henreitta Leber, MD  folic acid (FOLVITE) 1 MG tablet Take 1 tablet (1 mg total) by mouth daily. 05/03/19   Isaac Bliss, Rayford Halsted, MD  gabapentin (NEURONTIN) 100 MG capsule  Take 1 capsule (100 mg total) by mouth 2 (two) times daily. 05/03/19 08/01/19  Henderson Cloud, MD  pantoprazole (PROTONIX) 40 MG tablet Take 1 tablet (40 mg total) by mouth daily. 05/03/19 06/03/19  Henderson Cloud, MD  thiamine 100 MG tablet Take 1 tablet (100 mg total) by mouth daily. 05/03/19   Philip Aspen, Limmie Patricia, MD  Vitamin D, Ergocalciferol, (DRISDOL) 1.25 MG (50000 UT) CAPS capsule Take 1 capsule (50,000 Units total) by mouth every 7 (seven) days for 12 doses. 06/03/19 08/20/19  Henderson Cloud, MD     Family History  Problem Relation Age of Onset  . Stroke Father     Social History   Socioeconomic History  . Marital status: Married    Spouse name: Not on file  . Number of children: 1  . Years of education: Not on file  . Highest education level: Not on file  Occupational History  . Occupation: retired    Associate Professor: UNEMPLOYED  Social Needs  . Financial resource strain: Not on file  . Food insecurity    Worry: Not on file    Inability: Not on file  . Transportation needs    Medical: Not on file    Non-medical: Not on file  Tobacco Use  . Smoking status: Never Smoker  . Smokeless tobacco: Never Used  Substance and Sexual Activity  . Alcohol use: Yes    Alcohol/week: 7.0 standard drinks    Types: 7 Cans of beer per week    Comment: 12 oz  . Drug use: No  . Sexual activity: Not on file  Lifestyle  . Physical activity    Days per week: Not on file    Minutes per session: Not on file  . Stress: Not on file  Relationships  . Social Musician on phone: Not on file    Gets together: Not on file    Attends religious service: Not on file    Active member of club or organization: Not on file    Attends meetings of clubs or organizations: Not on file    Relationship status: Not on file  Other Topics Concern  . Not on file  Social History Narrative  . Not on file     Vital Signs: BP 137/89   Pulse 66   Temp 98.2 F (36.8 C)   SpO2 99%   Physical Exam Vitals signs reviewed.  Skin:    General: Skin is warm and dry.     Comments: Drain intact Has obviously been slightly dislodged-- able to see one; possibly 2 side holes outside of skin site Injection of tubing shows flow of contrast directly out of side hole onto pts skin Unable  to confirm or deny if fistula remains  Granulated skin noted at site Pink color; no sign of infection     Imaging: No results found.  Labs:  CBC: Recent Labs    04/09/19 0452 04/10/19 0423 04/11/19 0428 06/03/19 0749  WBC 11.9* 9.9 8.2 7.5  HGB 13.1 13.0 12.0 13.6  HCT 36.1 36.9 34.2* 39.4  40.0  PLT 375 391 387 279.0    COAGS: Recent Labs    04/06/19 0404  INR 1.2  APTT 40*    BMP: Recent Labs    04/10/19 0423 04/11/19 0428 04/12/19 0359 04/14/19 0321 06/03/19 0749  NA 130* 130* 134* 129* 135  K 3.9 3.7 4.1 4.5 5.1  CL 99 96* 101 91*  95*  CO2 25 26 26 28 31   GLUCOSE 130* 122* 99 106* 109*  BUN 6* 9 10 12 14   CALCIUM 7.9* 8.0* 8.4* 8.6* 9.5  CREATININE 0.38* 0.35* 0.51 0.45 0.58  GFRNONAA >60 >60 >60 >60  --   GFRAA >60 >60 >60 >60  --     LIVER FUNCTION TESTS: Recent Labs    04/10/19 0423 04/11/19 0428 04/14/19 0321 06/03/19 0749  BILITOT 0.2* <0.1* 0.4 0.6  AST 26 37 25 21  ALT 16 22 21 28   ALKPHOS 69 64 73 99  PROT 5.9* 5.2* 5.4* 6.7  ALBUMIN 2.6* 2.6* 2.8* 4.4    Assessment:  Discussed with Dr Loreta AveWagner He called Dr Maisie Fushomas office--- she was unavailable. Confirmed no appt scheduled at this time at [facility] office Since obvious dislodgment of drain tube-- removal per Dr Loreta AveWagner Rec: watch for fever; pain; redness or infection at site. Follow up with Dr Clovis PuA Thomas  Removal without complication Dressing applied  Signed: Robet Leuamela A Aliyana Dlugosz, PA-C 06/07/2019, 1:38 PM   Please refer to Dr. Loreta AveWagner attestation of this note for management and plan.

## 2019-06-09 ENCOUNTER — Other Ambulatory Visit: Payer: PPO

## 2019-06-10 ENCOUNTER — Other Ambulatory Visit: Payer: Self-pay

## 2019-06-10 ENCOUNTER — Telehealth (INDEPENDENT_AMBULATORY_CARE_PROVIDER_SITE_OTHER): Payer: PPO | Admitting: Internal Medicine

## 2019-06-10 DIAGNOSIS — E559 Vitamin D deficiency, unspecified: Secondary | ICD-10-CM

## 2019-06-10 DIAGNOSIS — F1020 Alcohol dependence, uncomplicated: Secondary | ICD-10-CM | POA: Diagnosis not present

## 2019-06-10 DIAGNOSIS — M810 Age-related osteoporosis without current pathological fracture: Secondary | ICD-10-CM | POA: Diagnosis not present

## 2019-06-10 DIAGNOSIS — E440 Moderate protein-calorie malnutrition: Secondary | ICD-10-CM | POA: Diagnosis not present

## 2019-06-10 MED ORDER — THIAMINE HCL 100 MG PO TABS: 100.0000 mg | ORAL_TABLET | Freq: Every day | ORAL | 2 refills | Status: DC

## 2019-06-10 NOTE — Progress Notes (Signed)
Virtual Visit via Telephone Note  I connected with [name] on 06/10/19 at  3:00 PM EST by telephone and verified that I am speaking with the correct person using two identifiers.   I discussed the limitations, risks, security and privacy concerns of performing an evaluation and management service by telephone and the availability of in person appointments. I also discussed with the patient that there may be a patient responsible charge related to this service. The patient expressed understanding and agreed to proceed.  Location patient: home Location provider: work office Participants present for the call: patient, provider Patient did not have a visit in the prior 7 days to address this/these issue(s).   History of Present Illness:  We have scheduled this visit to discuss the results of her DEXA scan.  Results as below: LS  -4.7 RFN  -3.3 LFN  -3.3  I have presented different options for management including weekly oral versus monthly oral versus q. 6 months Prolia.  We have spent significant amount of time discussing potential side effects including esophagitis and osteonecrosis of the jaw.  We have discussed importance of routine dental care.    Observations/Objective: Patient sounds cheerful and well on the phone. I do not appreciate any increased work of breathing. Speech and thought processing are grossly intact. Patient reported vitals: none reported   Current Outpatient Medications:  .  acetaminophen (TYLENOL) 500 MG tablet, Take 500 mg by mouth every 6 (six) hours as needed for mild pain. , Disp: , Rfl:  .  amLODipine (NORVASC) 10 MG tablet, Take 1 tablet (10 mg total) by mouth daily., Disp: 90 tablet, Rfl: 1 .  aspirin 81 MG chewable tablet, Chew 1 tablet (81 mg total) by mouth daily., Disp:  , Rfl:  .  folic acid (FOLVITE) 1 MG tablet, Take 1 tablet (1 mg total) by mouth daily., Disp: 30 tablet, Rfl: 11 .  gabapentin (NEURONTIN) 100 MG capsule, Take 1 capsule  (100 mg total) by mouth 2 (two) times daily., Disp: 180 capsule, Rfl: 1 .  pantoprazole (PROTONIX) 40 MG tablet, Take 1 tablet (40 mg total) by mouth daily., Disp: 90 tablet, Rfl: 1 .  thiamine 100 MG tablet, Take 1 tablet (100 mg total) by mouth daily., Disp: 30 tablet, Rfl: 2 .  Vitamin D, Ergocalciferol, (DRISDOL) 1.25 MG (50000 UT) CAPS capsule, Take 1 capsule (50,000 Units total) by mouth every 7 (seven) days for 12 doses., Disp: 12 capsule, Rfl: 0  Review of Systems:  Constitutional: Denies fever, chills, diaphoresis, appetite change and fatigue.  HEENT: Denies photophobia, eye pain, redness, hearing loss, ear pain, congestion, sore throat, rhinorrhea, sneezing, mouth sores, trouble swallowing, neck pain, neck stiffness and tinnitus.   Respiratory: Denies SOB, DOE, cough, chest tightness,  and wheezing.   Cardiovascular: Denies chest pain, palpitations and leg swelling.  Gastrointestinal: Denies nausea, vomiting, abdominal pain, diarrhea, constipation, blood in stool and abdominal distention.  Genitourinary: Denies dysuria, urgency, frequency, hematuria, flank pain and difficulty urinating.  Endocrine: Denies: hot or cold intolerance, sweats, changes in hair or nails, polyuria, polydipsia. Musculoskeletal: Denies myalgias, back pain, joint swelling, arthralgias and gait problem.  Skin: Denies pallor, rash and wound.  Neurological: Denies dizziness, seizures, syncope, weakness, light-headedness, numbness and headaches.  Hematological: Denies adenopathy. Easy bruising, personal or family bleeding history  Psychiatric/Behavioral: Denies suicidal ideation, mood changes, confusion, nervousness, sleep disturbance and agitation   Assessment and Plan:  Age-related osteoporosis without current pathological fracture -She has elected to try  Prolia. -Orders placed for prior approval. -Repeat DEXA scan in 2 years.  Alcoholism ([facility])  -She has quit completely drinking as of 3 months ago.  -Continue thiamine, folate  Vitamin D deficiency -On vitamin D supplementation.  Malnutrition of moderate degree -She has gained some weight with Ensure and ice cream.    I discussed the assessment and treatment plan with the patient. The patient was provided an opportunity to ask questions and all were answered. The patient agreed with the plan and demonstrated an understanding of the instructions.   The patient was advised to call back or seek an in-person evaluation if the symptoms worsen or if the condition fails to improve as anticipated.  I provided 22 minutes of non-face-to-face time during this encounter.   Chaya Jan, MD [facility]

## 2019-06-14 ENCOUNTER — Encounter: Payer: Self-pay | Admitting: Internal Medicine

## 2019-06-14 ENCOUNTER — Ambulatory Visit (INDEPENDENT_AMBULATORY_CARE_PROVIDER_SITE_OTHER): Payer: PPO | Admitting: Internal Medicine

## 2019-06-14 ENCOUNTER — Other Ambulatory Visit: Payer: Self-pay

## 2019-06-14 VITALS — BP 136/60 | HR 64 | Temp 98.3°F | Ht 62.25 in | Wt 120.2 lb

## 2019-06-14 DIAGNOSIS — Z1211 Encounter for screening for malignant neoplasm of colon: Secondary | ICD-10-CM

## 2019-06-14 DIAGNOSIS — K5720 Diverticulitis of large intestine with perforation and abscess without bleeding: Secondary | ICD-10-CM

## 2019-06-14 NOTE — Patient Instructions (Signed)
We are giving you a handout to read on diverticulosis/diverticulitis.   Please avoid raw vegetables, you may have cooked vegetables.    Please follow up with Dr Carlean Purl in late January.    I appreciate the opportunity to care for you. Silvano Rusk, MD, [facility]

## 2019-06-14 NOTE — Progress Notes (Signed)
[name] 68 y.o. [birth year] [id number]  Assessment & Plan:   Encounter Diagnoses  Name Primary?  . Diverticulitis of large intestine with abscess without bleeding Yes  . Colon cancer screening    She is recovering.  Add cooked vegetables to diet RTC late Jan and discuss screening - ? ultraslim at hospital as had very angulated sigmoid even in 2008   CC: [name], Kirsten Halsted, MD Dr. Leighton Rivera Subjective:   Chief Complaint: Recent diverticulitis, overdue for screening colonoscopy  HPI [name] is here with her husband after she was hospitalized and treated for diverticulitis with abscess and fistula.  I know her from previous problems with marginal ulcer at gastrojejunostomy, iron deficiency anemia problems related to that and and NSAI/salicylate use/abuse.  She had a drain in and was managed without operation, and recently had the drain removed.  She is happy about that.  Some mild abdominal pain.  Bowel habits are regular.  She had a colonoscopy by me in 2008, with a very angulated sigmoid colon then and it was a difficult procedure to complete with a pediatric colonoscope.  She is eating better would like to expand her diet some.  She has been on a soft, low fiber diet.  Weight is coming up some.  Dr. Marcello Rivera does not plan surgery.   Allergies  Allergen Reactions  . Sulfa Antibiotics Anaphylaxis and Rash  . Sulfonamide Derivatives Anaphylaxis and Rash  . Dilaudid [Hydromorphone Hcl] Hives  . Penicillins Other (See Comments)    Has patient had a PCN reaction causing immediate rash, facial/tongue/throat swelling, SOB or lightheadedness with hypotension: No Has patient had a PCN reaction causing severe rash involving mucus membranes or skin necrosis: No Has patient had a PCN reaction that required hospitalization No Has patient had a PCN reaction occurring within the last 10 years: No If all of the above answers are "NO", then may proceed with Cephalosporin use.  Marland Kitchen  Hydromorphone Hcl Rash   Current Meds  Medication Sig  . acetaminophen (TYLENOL) 500 MG tablet Take 500 mg by mouth every 6 (six) hours as needed for mild pain.   Marland Kitchen amLODipine (NORVASC) 10 MG tablet Take 1 tablet (10 mg total) by mouth daily.  Marland Kitchen aspirin 81 MG chewable tablet Chew 1 tablet (81 mg total) by mouth daily.  . folic acid (FOLVITE) 1 MG tablet Take 1 tablet (1 mg total) by mouth daily.  Marland Kitchen gabapentin (NEURONTIN) 100 MG capsule Take 1 capsule (100 mg total) by mouth 2 (two) times daily.  . pantoprazole (PROTONIX) 40 MG tablet Take 1 tablet (40 mg total) by mouth daily.  Marland Kitchen thiamine 100 MG tablet Take 1 tablet (100 mg total) by mouth daily.  . Vitamin D, Ergocalciferol, (DRISDOL) 1.25 MG (50000 UT) CAPS capsule Take 1 capsule (50,000 Units total) by mouth every 7 (seven) days for 12 doses.   Past Medical History:  Diagnosis Date  . Alcoholism ([place]) 02/02/2009  . ANXIETY 01/21/2007  . Blood transfusion    hx of 2005   . DIVERTICULOSIS 07/09/2007  . Duodenal ulcer hemorrhage perforated 2006  . Headache(784.0) 05/31/2007   pt denies at 08/12/11 preop visit   . HYPERTENSION 06/23/2007  . HYPONATREMIA 02/02/2009   better off diuretic  . INSOMNIA 05/31/2007  . Iron deficiency anemia    pt denies at 08/12/11 visit   . NSAID-induced duodenal ulcer   . OSTEOPOROSIS 01/21/2007  . Raynaud's syndrome 06/23/2007  . Recurrent upper respiratory infection (URI)  pt reports slight cold using Nyquil prn at bedtime   . VITAMIN B12 DEFICIENCY 10/02/2009  . Zoster 2012   twice - left groin/vagina   Past Surgical History:  Procedure Laterality Date  . COLONOSCOPY  06/18/2007   angulated and stenotic sigmoid colon with severe sigmoid diverticulosis  . EXPLORATORY LAPAROTOMY  08/03/2004   1) duodenal ulcer oversew, pyloroplasty, anterior, posterior and truncal vagotomies  . EXPLORATORY LAPAROTOMY  10/20/2004   1) duodenal ulcer oversew  with exclusion2) side-side gastrojejunostomy 3) feeding  jejunostomy  . IR RADIOLOGIST EVAL & MGMT  04/26/2019  . IR RADIOLOGIST EVAL & MGMT  05/10/2019  . IR RADIOLOGIST EVAL & MGMT  06/07/2019  . IR SINUS/FIST TUBE CHK-NON GI  04/12/2019  . LAPAROSCOPY  08/14/2011   Procedure: LAPAROSCOPY DIAGNOSTIC;  Surgeon: Kandis Cocking, MD;  Location: [facility];  Service: General;  Laterality: N/A;  . LAPAROTOMY  08/14/2011   Procedure: EXPLORATORY LAPAROTOMY;  Surgeon: Kandis Cocking, MD;  Location: [facility];  Service: General;  Laterality: N/A;  exploratory laparotomy, lysis of adhesions, revision gastrojejunostomy, upper endoscopy  . OPEN REDUCTION INTERNAL FIXATION (ORIF) DISTAL RADIAL FRACTURE Right 05/01/2016   Procedure: OPEN REDUCTION INTERNAL FIXATION (ORIF) DISTAL RADIAL FRACTURE, right;  Surgeon: Betha Loa, MD;  Location: [facility];  Service: Orthopedics;  Laterality: Right;  . TUBAL LIGATION  1977  . UPPER GASTROINTESTINAL ENDOSCOPY  09/16/2007; 12/21/2008; 03/17/2011   2009: Normal 2010: Normal with enteroscopy2012: Gastrojejunal  anastomotic ulcer, gastric retention   Social History   Social History Narrative   Married retired 1 child   Beer drinker 1 a day, no tobacco no drug use   family history includes Stroke in her father.   Review of Systems Various joint pains some fatigue improving overall all other review of systems negative except as per HPI  Objective:   Physical Exam BP 136/60 (BP Location: Left Arm, Patient Position: Sitting, Cuff Size: Normal)   Pulse 64   Temp 98.3 F (36.8 C)   Ht 5' 2.25" (1.581 m) Comment: height measured without shoes  Wt 120 lb 4 oz (54.5 kg)   BMI 21.82 kg/m  Frail thin petite ww NAD Eyes anicteric Lungs cta - diffusely decreased breath sounds Cor NL abd thin - midline scars, bandaged drain scar LLQ sl tender o/w negative Skin - multiple nevi and sun damage Alert and oriented x 3 Appropriate mood/affect  I have reviewed labs hospital records from hospitalizations and  interventional radiology procedures and imaging all reflected in the EMR.  Recent CMET normal Ferritin 221 Vitamin B12 349 Hemoglobin 13.6 all from November 13

## 2019-06-16 ENCOUNTER — Encounter: Payer: Self-pay | Admitting: Internal Medicine

## 2019-07-01 ENCOUNTER — Ambulatory Visit
Admission: RE | Admit: 2019-07-01 | Discharge: 2019-07-01 | Disposition: A | Discharge status: Home / Self Care | Payer: PPO | Source: Ambulatory Visit | Attending: Internal Medicine | Admitting: Internal Medicine

## 2019-07-01 ENCOUNTER — Other Ambulatory Visit: Payer: Self-pay

## 2019-07-01 DIAGNOSIS — Z1231 Encounter for screening mammogram for malignant neoplasm of breast: Secondary | ICD-10-CM | POA: Diagnosis not present

## 2019-07-01 DIAGNOSIS — Z1239 Encounter for other screening for malignant neoplasm of breast: Secondary | ICD-10-CM

## 2019-07-01 DIAGNOSIS — Z780 Asymptomatic menopausal state: Secondary | ICD-10-CM

## 2019-07-04 ENCOUNTER — Other Ambulatory Visit: Payer: Self-pay

## 2019-07-05 ENCOUNTER — Encounter: Payer: Self-pay | Admitting: Internal Medicine

## 2019-07-05 ENCOUNTER — Ambulatory Visit (INDEPENDENT_AMBULATORY_CARE_PROVIDER_SITE_OTHER): Payer: PPO | Admitting: Internal Medicine

## 2019-07-05 VITALS — BP 140/80 | Temp 98.0°F | Wt 123.0 lb

## 2019-07-05 DIAGNOSIS — E559 Vitamin D deficiency, unspecified: Secondary | ICD-10-CM

## 2019-07-05 DIAGNOSIS — I10 Essential (primary) hypertension: Secondary | ICD-10-CM | POA: Diagnosis not present

## 2019-07-05 NOTE — Progress Notes (Signed)
Established Patient Office Visit     This visit occurred during the SARS-CoV-2 public health emergency.  Safety protocols were in place, including screening questions prior to the visit, additional usage of staff PPE, and extensive cleaning of exam room while observing appropriate contact time as indicated for disinfecting solutions.    CC/Reason for Visit: Follow-up chronic conditions  HPI: [name] is a 68 y.o. female who is coming in today for the above mentioned reasons.  She was initially scheduled for a pelvic exam as we ran out of time to do that during previous visit for physical.  However since I last saw her she had a follow-up with GYN who did her pelvic, mammogram and bone density test.  She is wondering when she should recheck her vitamin D levels, she was diagnosed with vitamin D deficiency based off of labs during her November 13 physical.  She has no acute complaints.  She has gained 3 more pounds since her last visit which is fantastic.   Past Medical/Surgical History: Past Medical History:  Diagnosis Date  . Alcoholism ([place]) 02/02/2009  . ANXIETY 01/21/2007  . Blood transfusion    hx of 2005   . DIVERTICULOSIS 07/09/2007  . Duodenal ulcer hemorrhage perforated 2006  . Headache(784.0) 05/31/2007   pt denies at 08/12/11 preop visit   . HYPERTENSION 06/23/2007  . HYPONATREMIA 02/02/2009   better off diuretic  . INSOMNIA 05/31/2007  . Iron deficiency anemia    pt denies at 08/12/11 visit   . NSAID-induced duodenal ulcer   . OSTEOPOROSIS 01/21/2007  . Raynaud's syndrome 06/23/2007  . Recurrent upper respiratory infection (URI)    pt reports slight cold using Nyquil prn at bedtime   . VITAMIN B12 DEFICIENCY 10/02/2009  . Zoster 2012   twice - left groin/vagina    Past Surgical History:  Procedure Laterality Date  . COLONOSCOPY  06/18/2007   angulated and stenotic sigmoid colon with severe sigmoid diverticulosis  . EXPLORATORY LAPAROTOMY  08/03/2004   1)  duodenal ulcer oversew, pyloroplasty, anterior, posterior and truncal vagotomies  . EXPLORATORY LAPAROTOMY  10/20/2004   1) duodenal ulcer oversew  with exclusion2) side-side gastrojejunostomy 3) feeding jejunostomy  . IR RADIOLOGIST EVAL & MGMT  04/26/2019  . IR RADIOLOGIST EVAL & MGMT  05/10/2019  . IR RADIOLOGIST EVAL & MGMT  06/07/2019  . IR SINUS/FIST TUBE CHK-NON GI  04/12/2019  . LAPAROSCOPY  08/14/2011   Procedure: LAPAROSCOPY DIAGNOSTIC;  Surgeon: Shann Medal, MD;  Location: [facility];  Service: General;  Laterality: N/A;  . LAPAROTOMY  08/14/2011   Procedure: EXPLORATORY LAPAROTOMY;  Surgeon: Shann Medal, MD;  Location: [facility];  Service: General;  Laterality: N/A;  exploratory laparotomy, lysis of adhesions, revision gastrojejunostomy, upper endoscopy  . OPEN REDUCTION INTERNAL FIXATION (ORIF) DISTAL RADIAL FRACTURE Right 05/01/2016   Procedure: OPEN REDUCTION INTERNAL FIXATION (ORIF) DISTAL RADIAL FRACTURE, right;  Surgeon: Leanora Cover, MD;  Location: [facility];  Service: Orthopedics;  Laterality: Right;  . TUBAL LIGATION  1977  . UPPER GASTROINTESTINAL ENDOSCOPY  09/16/2007; 12/21/2008; 03/17/2011   2009: Normal 2010: Normal with enteroscopy2012: Gastrojejunal  anastomotic ulcer, gastric retention    Social History:  reports that she has never smoked. She has never used smokeless tobacco. She reports current alcohol use of about 7.0 standard drinks of alcohol per week. She reports that she does not use drugs.  Allergies: Allergies  Allergen Reactions  . Sulfa Antibiotics Anaphylaxis and Rash  .  Sulfonamide Derivatives Anaphylaxis and Rash  . Dilaudid [Hydromorphone Hcl] Hives  . Penicillins Other (See Comments)    Has patient had a PCN reaction causing immediate rash, facial/tongue/throat swelling, SOB or lightheadedness with hypotension: No Has patient had a PCN reaction causing severe rash involving mucus membranes or skin necrosis: No Has patient had a PCN  reaction that required hospitalization No Has patient had a PCN reaction occurring within the last 10 years: No If all of the above answers are "NO", then may proceed with Cephalosporin use.  Marland Kitchen Hydromorphone Hcl Rash    Family History:  Family History  Problem Relation Age of Onset  . Stroke Father      Current Outpatient Medications:  .  acetaminophen (TYLENOL) 500 MG tablet, Take 500 mg by mouth every 6 (six) hours as needed for mild pain. , Disp: , Rfl:  .  amLODipine (NORVASC) 10 MG tablet, Take 1 tablet (10 mg total) by mouth daily., Disp: 90 tablet, Rfl: 1 .  aspirin 81 MG chewable tablet, Chew 1 tablet (81 mg total) by mouth daily., Disp:  , Rfl:  .  folic acid (FOLVITE) 1 MG tablet, Take 1 tablet (1 mg total) by mouth daily., Disp: 30 tablet, Rfl: 11 .  gabapentin (NEURONTIN) 100 MG capsule, Take 1 capsule (100 mg total) by mouth 2 (two) times daily., Disp: 180 capsule, Rfl: 1 .  pantoprazole (PROTONIX) 40 MG tablet, Take 1 tablet (40 mg total) by mouth daily., Disp: 90 tablet, Rfl: 1 .  thiamine 100 MG tablet, Take 1 tablet (100 mg total) by mouth daily., Disp: 30 tablet, Rfl: 2 .  Vitamin D, Ergocalciferol, (DRISDOL) 1.25 MG (50000 UT) CAPS capsule, Take 1 capsule (50,000 Units total) by mouth every 7 (seven) days for 12 doses., Disp: 12 capsule, Rfl: 0  Review of Systems:  Constitutional: Denies fever, chills, diaphoresis, appetite change and fatigue.  HEENT: Denies photophobia, eye pain, redness, hearing loss, ear pain, congestion, sore throat, rhinorrhea, sneezing, mouth sores, trouble swallowing, neck pain, neck stiffness and tinnitus.   Respiratory: Denies SOB, DOE, cough, chest tightness,  and wheezing.   Cardiovascular: Denies chest pain, palpitations and leg swelling.  Gastrointestinal: Denies nausea, vomiting, abdominal pain, diarrhea, constipation, blood in stool and abdominal distention.  Genitourinary: Denies dysuria, urgency, frequency, hematuria, flank pain and  difficulty urinating.  Endocrine: Denies: hot or cold intolerance, sweats, changes in hair or nails, polyuria, polydipsia. Musculoskeletal: Denies myalgias, back pain, joint swelling, arthralgias and gait problem.  Skin: Denies pallor, rash and wound.  Neurological: Denies dizziness, seizures, syncope, weakness, light-headedness, numbness and headaches.  Hematological: Denies adenopathy. Easy bruising, personal or family bleeding history  Psychiatric/Behavioral: Denies suicidal ideation, mood changes, confusion, nervousness, sleep disturbance and agitation    Physical Exam: Vitals:   07/05/19 1117  BP: 140/80  Temp: 98 F (36.7 C)  TempSrc: Temporal  Weight: 123 lb (55.8 kg)    Body mass index is 22.32 kg/m.   Constitutional: NAD, calm, comfortable Eyes: PERRL, lids and conjunctivae normal ENMT: Mucous membranes are moist.  Respiratory: clear to auscultation bilaterally, no wheezing, no crackles. Normal respiratory effort. No accessory muscle use.  Cardiovascular: Regular rate and rhythm, no murmurs / rubs / gallops. No extremity edema. 2+ pedal pulses.  Psychiatric: Normal judgment and insight. Alert and oriented x 3. Normal mood.    Impression and Plan:  Vitamin D deficiency -She was started on high-dose vitamin D supplementation mid-November, recheck vitamin D levels in 3.  Essential hypertension -Blood pressure  little elevated today, she is not taking her medications yet this morning. -Blood pressure has been well controlled during prior visits.  Continue amlodipine 10 mg.      Chaya JanEstela Hernandez Acosta, MD [facility]

## 2019-08-20 ENCOUNTER — Other Ambulatory Visit: Payer: Self-pay | Admitting: Internal Medicine

## 2019-08-20 DIAGNOSIS — E559 Vitamin D deficiency, unspecified: Secondary | ICD-10-CM

## 2019-08-29 ENCOUNTER — Telehealth: Payer: Self-pay | Admitting: *Deleted

## 2019-08-29 ENCOUNTER — Encounter: Payer: Self-pay | Admitting: *Deleted

## 2019-08-29 NOTE — Telephone Encounter (Signed)
-----   Message from Iva Boop, MD sent at 08/27/2019  5:18 PM EST ----- Regarding: needs appt me - next avail Please call or send a letter about setting up a f/u visit  Thx

## 2019-08-29 NOTE — Telephone Encounter (Signed)
Letter mailed to the patient per Dr. Marvell Fuller request.

## 2019-09-03 ENCOUNTER — Ambulatory Visit: Payer: PPO | Attending: Internal Medicine

## 2019-09-03 DIAGNOSIS — Z23 Encounter for immunization: Secondary | ICD-10-CM | POA: Insufficient documentation

## 2019-09-03 NOTE — Progress Notes (Signed)
   Covid-19 Vaccination Clinic  Name:  [name]    MRN: [id number] DOB: May 07, 1951  09/03/2019  [name] was observed post Covid-19 immunization for 15 minutes without incidence. She was provided with Vaccine Information Sheet and instruction to access the V-Safe system.   [name] was instructed to call 911 with any severe reactions post vaccine: Marland Kitchen Difficulty breathing  . Swelling of your face and throat  . A fast heartbeat  . A bad rash all over your body  . Dizziness and weakness    Immunizations Administered    Name Date Dose VIS Date Route   Pfizer COVID-19 Vaccine 09/03/2019  9:05 AM 0.3 mL 07/01/2019 Intramuscular   Manufacturer: [employer], [employer]   Lot: [id number]   NDC: [address]-1

## 2019-09-21 ENCOUNTER — Telehealth: Payer: Self-pay | Admitting: *Deleted

## 2019-09-21 NOTE — Telephone Encounter (Signed)
Patient is interested in [employer] verification will be started

## 2019-09-25 ENCOUNTER — Ambulatory Visit: Payer: PPO | Attending: Internal Medicine

## 2019-09-25 DIAGNOSIS — Z23 Encounter for immunization: Secondary | ICD-10-CM | POA: Insufficient documentation

## 2019-09-25 NOTE — Progress Notes (Signed)
   Covid-19 Vaccination Clinic  Name:  [name]    MRN: [id number] DOB: Jan 17, 1951  09/25/2019  [name] was observed post Covid-19 immunization for 15 minutes without incident. She was provided with Vaccine Information Sheet and instruction to access the V-Safe system.   [name] was instructed to call 911 with any severe reactions post vaccine: Marland Kitchen Difficulty breathing  . Swelling of face and throat  . A fast heartbeat  . A bad rash all over body  . Dizziness and weakness   Immunizations Administered    Name Date Dose VIS Date Route   Pfizer COVID-19 Vaccine 09/25/2019 11:50 AM 0.3 mL 07/01/2019 Intramuscular   Manufacturer: [employer], [employer]   Lot: [id number]   NDC: [address]-2

## 2019-09-27 NOTE — Telephone Encounter (Signed)
The cost of Prolia for the patient is 20% and 20% of admin fee.  Patient would pay estimated $270.  Husband is very interested.  Husband was given the information about the [employer].  Husband would like to know if it safe for patient to receive Prolia because of her GI surgery history?

## 2019-09-29 NOTE — Telephone Encounter (Signed)
Husband is aware. He will call back after calling about the grant.

## 2019-10-11 NOTE — Telephone Encounter (Signed)
Patient has been approved for the grant. Appointment scheduled for Prolia.

## 2019-10-17 ENCOUNTER — Other Ambulatory Visit: Payer: Self-pay

## 2019-10-18 ENCOUNTER — Ambulatory Visit (INDEPENDENT_AMBULATORY_CARE_PROVIDER_SITE_OTHER): Payer: PPO | Admitting: *Deleted

## 2019-10-18 DIAGNOSIS — M810 Age-related osteoporosis without current pathological fracture: Secondary | ICD-10-CM

## 2019-10-18 MED ORDER — DENOSUMAB 60 MG/ML ~~LOC~~ SOSY
60.0000 mg | PREFILLED_SYRINGE | Freq: Once | SUBCUTANEOUS | Status: AC
  Administered 2019-10-18: 60 mg via SUBCUTANEOUS

## 2019-10-18 NOTE — Progress Notes (Signed)
Per orders of Dr. Hernandez, injection of Prolia given by Josalin Carneiro. Patient tolerated injection well. 

## 2019-10-24 ENCOUNTER — Other Ambulatory Visit: Payer: Self-pay | Admitting: Internal Medicine

## 2019-10-24 DIAGNOSIS — K5720 Diverticulitis of large intestine with perforation and abscess without bleeding: Secondary | ICD-10-CM

## 2019-10-28 ENCOUNTER — Other Ambulatory Visit: Payer: Self-pay | Admitting: Internal Medicine

## 2019-10-28 DIAGNOSIS — I10 Essential (primary) hypertension: Secondary | ICD-10-CM

## 2019-10-28 DIAGNOSIS — K5720 Diverticulitis of large intestine with perforation and abscess without bleeding: Secondary | ICD-10-CM

## 2019-11-23 ENCOUNTER — Inpatient Hospital Stay (HOSPITAL_COMMUNITY)
Admission: EM | Admit: 2019-11-23 | Discharge: 2019-11-27 | DRG: 871 | Disposition: A | Discharge status: Home / Self Care | Payer: PPO | Attending: Internal Medicine | Admitting: Internal Medicine

## 2019-11-23 ENCOUNTER — Encounter (HOSPITAL_COMMUNITY): Payer: Self-pay | Admitting: Emergency Medicine

## 2019-11-23 ENCOUNTER — Other Ambulatory Visit: Payer: Self-pay

## 2019-11-23 ENCOUNTER — Inpatient Hospital Stay (HOSPITAL_COMMUNITY): Payer: PPO

## 2019-11-23 ENCOUNTER — Emergency Department (HOSPITAL_COMMUNITY): Payer: PPO

## 2019-11-23 DIAGNOSIS — M810 Age-related osteoporosis without current pathological fracture: Secondary | ICD-10-CM | POA: Diagnosis not present

## 2019-11-23 DIAGNOSIS — R4189 Other symptoms and signs involving cognitive functions and awareness: Secondary | ICD-10-CM | POA: Diagnosis not present

## 2019-11-23 DIAGNOSIS — K264 Chronic or unspecified duodenal ulcer with hemorrhage: Secondary | ICD-10-CM | POA: Diagnosis not present

## 2019-11-23 DIAGNOSIS — G9341 Metabolic encephalopathy: Secondary | ICD-10-CM | POA: Diagnosis not present

## 2019-11-23 DIAGNOSIS — K5732 Diverticulitis of large intestine without perforation or abscess without bleeding: Secondary | ICD-10-CM | POA: Diagnosis not present

## 2019-11-23 DIAGNOSIS — F1011 Alcohol abuse, in remission: Secondary | ICD-10-CM | POA: Diagnosis not present

## 2019-11-23 DIAGNOSIS — N3000 Acute cystitis without hematuria: Secondary | ICD-10-CM | POA: Diagnosis not present

## 2019-11-23 DIAGNOSIS — B965 Pseudomonas (aeruginosa) (mallei) (pseudomallei) as the cause of diseases classified elsewhere: Secondary | ICD-10-CM | POA: Diagnosis not present

## 2019-11-23 DIAGNOSIS — R410 Disorientation, unspecified: Secondary | ICD-10-CM | POA: Diagnosis not present

## 2019-11-23 DIAGNOSIS — E871 Hypo-osmolality and hyponatremia: Secondary | ICD-10-CM | POA: Diagnosis present

## 2019-11-23 DIAGNOSIS — A419 Sepsis, unspecified organism: Principal | ICD-10-CM | POA: Diagnosis present

## 2019-11-23 DIAGNOSIS — N390 Urinary tract infection, site not specified: Secondary | ICD-10-CM

## 2019-11-23 DIAGNOSIS — K5720 Diverticulitis of large intestine with perforation and abscess without bleeding: Secondary | ICD-10-CM | POA: Diagnosis not present

## 2019-11-23 DIAGNOSIS — G92 Toxic encephalopathy: Secondary | ICD-10-CM | POA: Diagnosis not present

## 2019-11-23 DIAGNOSIS — R9431 Abnormal electrocardiogram [ECG] [EKG]: Secondary | ICD-10-CM | POA: Diagnosis present

## 2019-11-23 DIAGNOSIS — Z20822 Contact with and (suspected) exposure to covid-19: Secondary | ICD-10-CM | POA: Diagnosis present

## 2019-11-23 DIAGNOSIS — Z882 Allergy status to sulfonamides status: Secondary | ICD-10-CM

## 2019-11-23 DIAGNOSIS — Z8673 Personal history of transient ischemic attack (TIA), and cerebral infarction without residual deficits: Secondary | ICD-10-CM | POA: Diagnosis not present

## 2019-11-23 DIAGNOSIS — Z823 Family history of stroke: Secondary | ICD-10-CM | POA: Diagnosis not present

## 2019-11-23 DIAGNOSIS — Z7982 Long term (current) use of aspirin: Secondary | ICD-10-CM

## 2019-11-23 DIAGNOSIS — Z885 Allergy status to narcotic agent status: Secondary | ICD-10-CM | POA: Diagnosis not present

## 2019-11-23 DIAGNOSIS — I7300 Raynaud's syndrome without gangrene: Secondary | ICD-10-CM | POA: Diagnosis not present

## 2019-11-23 DIAGNOSIS — D72829 Elevated white blood cell count, unspecified: Secondary | ICD-10-CM | POA: Diagnosis not present

## 2019-11-23 DIAGNOSIS — N3001 Acute cystitis with hematuria: Secondary | ICD-10-CM | POA: Diagnosis not present

## 2019-11-23 DIAGNOSIS — Z880 Allergy status to penicillin: Secondary | ICD-10-CM

## 2019-11-23 DIAGNOSIS — K651 Peritoneal abscess: Secondary | ICD-10-CM | POA: Diagnosis not present

## 2019-11-23 DIAGNOSIS — I10 Essential (primary) hypertension: Secondary | ICD-10-CM | POA: Diagnosis present

## 2019-11-23 HISTORY — DX: Urinary tract infection, site not specified: N39.0

## 2019-11-23 LAB — URINALYSIS, ROUTINE W REFLEX MICROSCOPIC
Bilirubin Urine: NEGATIVE
Glucose, UA: NEGATIVE mg/dL
Ketones, ur: NEGATIVE mg/dL
Nitrite: NEGATIVE
Protein, ur: NEGATIVE mg/dL
Specific Gravity, Urine: 1.004 — ABNORMAL LOW (ref 1.005–1.030)
WBC, UA: >50 WBC/hpf — ABNORMAL HIGH (ref 0–5)
pH: 6 (ref 5.0–8.0)

## 2019-11-23 LAB — CBC WITH DIFFERENTIAL/PLATELET
Abs Immature Granulocytes: 0.23 10*3/uL — ABNORMAL HIGH (ref 0.00–0.07)
Basophils Absolute: 0.1 10*3/uL (ref 0.0–0.1)
Basophils Relative: 1 %
Eosinophils Absolute: 0 10*3/uL (ref 0.0–0.5)
Eosinophils Relative: 0 %
HCT: 38.7 % (ref 36.0–46.0)
Hemoglobin: 13.3 g/dL (ref 12.0–15.0)
Immature Granulocytes: 1 %
Lymphocytes Relative: 3 %
Lymphs Abs: 0.8 10*3/uL (ref 0.7–4.0)
MCH: 31.6 pg (ref 26.0–34.0)
MCHC: 34.4 g/dL (ref 30.0–36.0)
MCV: 91.9 fL (ref 80.0–100.0)
Monocytes Absolute: 2 10*3/uL — ABNORMAL HIGH (ref 0.1–1.0)
Monocytes Relative: 9 %
Neutro Abs: 20.1 10*3/uL — ABNORMAL HIGH (ref 1.7–7.7)
Neutrophils Relative %: 86 %
Platelets: 436 10*3/uL — ABNORMAL HIGH (ref 150–400)
RBC: 4.21 MIL/uL (ref 3.87–5.11)
RDW: 13.2 % (ref 11.5–15.5)
WBC: 23.2 10*3/uL — ABNORMAL HIGH (ref 4.0–10.5)
nRBC: 0 % (ref 0.0–0.2)

## 2019-11-23 LAB — BASIC METABOLIC PANEL
Anion gap: 12 (ref 5–15)
BUN: <5 mg/dL — ABNORMAL LOW (ref 8–23)
CO2: 23 mmol/L (ref 22–32)
Calcium: 6.3 mg/dL — CL (ref 8.9–10.3)
Chloride: 96 mmol/L — ABNORMAL LOW (ref 98–111)
Creatinine, Ser: 0.44 mg/dL (ref 0.44–1.00)
GFR calc Af Amer: >60 mL/min (ref >60)
GFR calc non Af Amer: >60 mL/min (ref >60)
Glucose, Bld: 102 mg/dL — ABNORMAL HIGH (ref 70–99)
Potassium: 3.7 mmol/L (ref 3.5–5.1)
Sodium: 131 mmol/L — ABNORMAL LOW (ref 135–145)

## 2019-11-23 LAB — COMPREHENSIVE METABOLIC PANEL
ALT: 22 U/L (ref 0–44)
AST: 24 U/L (ref 15–41)
Albumin: 3.1 g/dL — ABNORMAL LOW (ref 3.5–5.0)
Alkaline Phosphatase: 150 U/L — ABNORMAL HIGH (ref 38–126)
Anion gap: 12 (ref 5–15)
BUN: <5 mg/dL — ABNORMAL LOW (ref 8–23)
CO2: 21 mmol/L — ABNORMAL LOW (ref 22–32)
Calcium: 6.9 mg/dL — ABNORMAL LOW (ref 8.9–10.3)
Chloride: 88 mmol/L — ABNORMAL LOW (ref 98–111)
Creatinine, Ser: 0.54 mg/dL (ref 0.44–1.00)
GFR calc Af Amer: >60 mL/min (ref >60)
GFR calc non Af Amer: >60 mL/min (ref >60)
Glucose, Bld: 125 mg/dL — ABNORMAL HIGH (ref 70–99)
Potassium: 4.4 mmol/L (ref 3.5–5.1)
Sodium: 121 mmol/L — ABNORMAL LOW (ref 135–145)
Total Bilirubin: 0.9 mg/dL (ref 0.3–1.2)
Total Protein: 6.6 g/dL (ref 6.5–8.1)

## 2019-11-23 LAB — RESPIRATORY PANEL BY RT PCR (FLU A&B, COVID)
Influenza A by PCR: NEGATIVE
Influenza B by PCR: NEGATIVE
SARS Coronavirus 2 by RT PCR: NEGATIVE

## 2019-11-23 LAB — LIPASE, BLOOD: Lipase: 16 U/L (ref 11–51)

## 2019-11-23 LAB — ETHANOL: Alcohol, Ethyl (B): <10 mg/dL (ref <10)

## 2019-11-23 MED ORDER — PIPERACILLIN-TAZOBACTAM 3.375 G IVPB: 3.3750 g | Freq: Three times a day (TID) | INTRAVENOUS | Status: DC

## 2019-11-23 MED ORDER — ENOXAPARIN SODIUM 40 MG/0.4ML ~~LOC~~ SOLN: 40.0000 mg | SUBCUTANEOUS | Status: DC

## 2019-11-23 MED ORDER — GABAPENTIN 100 MG PO CAPS
100.0000 mg | ORAL_CAPSULE | Freq: Two times a day (BID) | ORAL | Status: DC
  Administered 2019-11-23 – 2019-11-27 (×9): 100 mg via ORAL
  Filled 2019-11-23 (×9): qty 1

## 2019-11-23 MED ORDER — AMLODIPINE BESYLATE 10 MG PO TABS
10.0000 mg | ORAL_TABLET | Freq: Every day | ORAL | Status: DC
  Administered 2019-11-23 – 2019-11-27 (×5): 10 mg via ORAL
  Filled 2019-11-23 (×4): qty 1
  Filled 2019-11-23: qty 2

## 2019-11-23 MED ORDER — FENTANYL CITRATE (PF) 100 MCG/2ML IJ SOLN
INTRAMUSCULAR | Status: AC | PRN
  Administered 2019-11-23 (×2): 50 ug via INTRAVENOUS

## 2019-11-23 MED ORDER — SODIUM CHLORIDE 0.9 % IV SOLN
1.0000 g | Freq: Once | INTRAVENOUS | Status: AC
  Administered 2019-11-23: 1 g via INTRAVENOUS
  Filled 2019-11-23: qty 10

## 2019-11-23 MED ORDER — SODIUM CHLORIDE 0.9% FLUSH
3.0000 mL | Freq: Two times a day (BID) | INTRAVENOUS | Status: DC
  Administered 2019-11-23 – 2019-11-26 (×5): 3 mL via INTRAVENOUS

## 2019-11-23 MED ORDER — SODIUM CHLORIDE 0.9 % IV BOLUS
1000.0000 mL | Freq: Once | INTRAVENOUS | Status: AC
  Administered 2019-11-23: 1000 mL via INTRAVENOUS

## 2019-11-23 MED ORDER — MIDAZOLAM HCL 5 MG/5ML IJ SOLN
INTRAMUSCULAR | Status: AC | PRN
  Administered 2019-11-23: 1 mg via INTRAVENOUS

## 2019-11-23 MED ORDER — SODIUM CHLORIDE 0.9 % IV SOLN: INTRAVENOUS | Status: DC

## 2019-11-23 MED ORDER — MIDAZOLAM HCL 2 MG/2ML IJ SOLN
INTRAMUSCULAR | Status: AC | PRN
  Administered 2019-11-23: 1 mg via INTRAVENOUS

## 2019-11-23 MED ORDER — IOHEXOL 300 MG/ML  SOLN
100.0000 mL | Freq: Once | INTRAMUSCULAR | Status: AC | PRN
  Administered 2019-11-23: 100 mL via INTRAVENOUS

## 2019-11-23 MED ORDER — MIDAZOLAM HCL 2 MG/2ML IJ SOLN
INTRAMUSCULAR | Status: AC
  Filled 2019-11-23: qty 4

## 2019-11-23 MED ORDER — ENOXAPARIN SODIUM 40 MG/0.4ML ~~LOC~~ SOLN
40.0000 mg | SUBCUTANEOUS | Status: DC
  Administered 2019-11-25 – 2019-11-26 (×2): 40 mg via SUBCUTANEOUS
  Filled 2019-11-23 (×2): qty 0.4

## 2019-11-23 MED ORDER — ACETAMINOPHEN 325 MG PO TABS
650.0000 mg | ORAL_TABLET | Freq: Four times a day (QID) | ORAL | Status: DC | PRN
  Administered 2019-11-23 – 2019-11-27 (×13): 650 mg via ORAL
  Filled 2019-11-23 (×13): qty 2

## 2019-11-23 MED ORDER — SODIUM CHLORIDE 0.9 % IV SOLN: Freq: Once | INTRAVENOUS | Status: AC

## 2019-11-23 MED ORDER — FENTANYL CITRATE (PF) 100 MCG/2ML IJ SOLN
INTRAMUSCULAR | Status: AC
  Filled 2019-11-23: qty 4

## 2019-11-23 MED ORDER — SODIUM CHLORIDE 0.9% FLUSH
5.0000 mL | Freq: Three times a day (TID) | INTRAVENOUS | Status: DC
  Administered 2019-11-23 – 2019-11-27 (×10): 5 mL

## 2019-11-23 MED ORDER — SODIUM CHLORIDE 0.9 % IV SOLN: 1.0000 g | Freq: Once | INTRAVENOUS | Status: DC

## 2019-11-23 MED ORDER — CALCIUM GLUCONATE-NACL 2-0.675 GM/100ML-% IV SOLN
2.0000 g | Freq: Once | INTRAVENOUS | Status: AC
  Administered 2019-11-23: 2000 mg via INTRAVENOUS
  Filled 2019-11-23 (×2): qty 100

## 2019-11-23 MED ORDER — METRONIDAZOLE IN NACL 5-0.79 MG/ML-% IV SOLN
500.0000 mg | Freq: Three times a day (TID) | INTRAVENOUS | Status: DC
  Administered 2019-11-23 – 2019-11-24 (×4): 500 mg via INTRAVENOUS
  Filled 2019-11-23 (×4): qty 100

## 2019-11-23 MED ORDER — ACETAMINOPHEN 650 MG RE SUPP: 650.0000 mg | Freq: Four times a day (QID) | RECTAL | Status: DC | PRN

## 2019-11-23 MED ORDER — ALBUTEROL SULFATE (2.5 MG/3ML) 0.083% IN NEBU: 2.5000 mg | INHALATION_SOLUTION | Freq: Four times a day (QID) | RESPIRATORY_TRACT | Status: DC | PRN

## 2019-11-23 MED ORDER — SODIUM CHLORIDE 0.9 % IV SOLN
2.0000 g | INTRAVENOUS | Status: DC
  Filled 2019-11-23: qty 20

## 2019-11-23 NOTE — Progress Notes (Signed)
Pharmacy Antibiotic Note  [name] is a 69 y.o. female admitted on 11/23/2019 with diverticulitis and intraabdominal abscess.  Pharmacy has been consulted for zosyn dosing.  Plan: Zosyn 3.375g IV every 8 hours (extended infusion) Monitor renal function, clinical progression and LOT  Height: 5\' 2"  (157.5 cm) Weight: 55 kg (121 lb 4.1 oz) IBW/kg (Calculated) : 50.1  Temp (24hrs), Avg:98.7 F (37.1 C), Min:98.7 F (37.1 C), Max:98.7 F (37.1 C)  Recent Labs  Lab 11/23/19 0511  WBC 23.2*  CREATININE 0.54    Estimated Creatinine Clearance: 54 mL/min (by C-G formula based on SCr of 0.54 mg/dL).    Allergies  Allergen Reactions  . Sulfa Antibiotics Anaphylaxis and Rash  . Sulfonamide Derivatives Anaphylaxis and Rash  . Dilaudid [Hydromorphone Hcl] Hives  . Penicillins Other (See Comments)    Has patient had a PCN reaction causing immediate rash, facial/tongue/throat swelling, SOB or lightheadedness with hypotension: No Has patient had a PCN reaction causing severe rash involving mucus membranes or skin necrosis: No Has patient had a PCN reaction that required hospitalization No Has patient had a PCN reaction occurring within the last 10 years: No If all of the above answers are "NO", then may proceed with Cephalosporin use.  01/23/20 Hydromorphone Hcl Rash    Marland Kitchen, PharmD Clinical Pharmacist ED Pharmacist Phone # [phone] 11/23/2019 8:37 AM

## 2019-11-23 NOTE — Consult Note (Addendum)
[name] 06-25-1951  [id number].    Requesting MD: Dr. Langston Masker Chief Complaint/Reason for Consult: Sigmoid diverticulitis with abscess  HPI: [name] is a 69 y.o. female with a history of HTN, alcoholism (states last drink was 1 year ago) and diverticulitis who presented to [facility] ED with decreased appetite and confusion.  Per chart review and history taking provided by patient and her husband, patient had decreased oral intake over the last 1 week secondary to decreased appetite.  This is similar to her last presentation with diverticulitis in September where she was admitted on 04/05/2019 with severe sigmoid diverticulitis with 9.7 x 3.7 x 3.8 cm diverticular abscess that improved with IR drain placement on 04/05/2019 and IV abx.  The patient had no associated fever, chills nausea, vomiting, or abdominal pain.  She has had some diarrhea. She is unsure about urinary symptoms. Husband states confusion has improved since presentation. She is now A&O x 3 (not orientated to time, thinks it is 19) which the husband states is her baseline.   In the ED patient was afebrile without tachycardia, tachypnea, hypoxia or hypotension.  WBC 23.2.  CT showed extensive sigmoid diverticulitis with complex multiloculated diverticular abscess of the sigmoid colon measuring 14 cm.  General surgery was asked to see.  Additional lab work showed hyponatremia with NA 121.  UA consistent with UTI.  EKG with prolonged QT with QTC 512.  TRH to admit.  Patient started on Rocephin/Flagyl in ED.  Patient is not on blood thinners.  She does have a history of ex lap for perforated ulcer in 2006s/p duodenal repairby Dr. Hulen Skains and revision of [place] 2013 Lucia Gaskins. She has also had a tubal ligation. No other abdominal surgeries reported. She believes her last colonoscopy was in 2008 by Dr. Carlean Purl.   ROS: Review of Systems  Constitutional: Positive for weight loss. Negative for chills and fever.  Gastrointestinal:  Positive for diarrhea. Negative for abdominal pain, blood in stool, constipation, melena, nausea and vomiting.       Decreased appetite   All other systems reviewed and are negative.   Family History  Problem Relation Age of Onset  . Stroke Father     Past Medical History:  Diagnosis Date  . Alcoholism ([place]) 02/02/2009  . ANXIETY 01/21/2007  . Blood transfusion    hx of 2005   . DIVERTICULOSIS 07/09/2007  . Duodenal ulcer hemorrhage perforated 2006  . Headache(784.0) 05/31/2007   pt denies at 08/12/11 preop visit   . HYPERTENSION 06/23/2007  . HYPONATREMIA 02/02/2009   better off diuretic  . INSOMNIA 05/31/2007  . Iron deficiency anemia    pt denies at 08/12/11 visit   . NSAID-induced duodenal ulcer   . OSTEOPOROSIS 01/21/2007  . Raynaud's syndrome 06/23/2007  . Recurrent upper respiratory infection (URI)    pt reports slight cold using Nyquil prn at bedtime   . VITAMIN B12 DEFICIENCY 10/02/2009  . Zoster 2012   twice - left groin/vagina    Past Surgical History:  Procedure Laterality Date  . COLONOSCOPY  06/18/2007   angulated and stenotic sigmoid colon with severe sigmoid diverticulosis  . EXPLORATORY LAPAROTOMY  08/03/2004   1) duodenal ulcer oversew, pyloroplasty, anterior, posterior and truncal vagotomies  . EXPLORATORY LAPAROTOMY  10/20/2004   1) duodenal ulcer oversew  with exclusion2) side-side gastrojejunostomy 3) feeding jejunostomy  . IR RADIOLOGIST EVAL & MGMT  04/26/2019  . IR RADIOLOGIST EVAL & MGMT  05/10/2019  . IR RADIOLOGIST EVAL &  MGMT  06/07/2019  . IR SINUS/FIST TUBE CHK-NON GI  04/12/2019  . LAPAROSCOPY  08/14/2011   Procedure: LAPAROSCOPY DIAGNOSTIC;  Surgeon: Kandis Cockingavid H Newman, MD;  Location: [facility];  Service: General;  Laterality: N/A;  . LAPAROTOMY  08/14/2011   Procedure: EXPLORATORY LAPAROTOMY;  Surgeon: Kandis Cockingavid H Newman, MD;  Location: [facility];  Service: General;  Laterality: N/A;  exploratory laparotomy, lysis of adhesions, revision gastrojejunostomy, upper  endoscopy  . OPEN REDUCTION INTERNAL FIXATION (ORIF) DISTAL RADIAL FRACTURE Right 05/01/2016   Procedure: OPEN REDUCTION INTERNAL FIXATION (ORIF) DISTAL RADIAL FRACTURE, right;  Surgeon: Betha LoaKevin Kuzma, MD;  Location: [facility];  Service: Orthopedics;  Laterality: Right;  . TUBAL LIGATION  1977  . UPPER GASTROINTESTINAL ENDOSCOPY  09/16/2007; 12/21/2008; 03/17/2011   2009: Normal 2010: Normal with enteroscopy2012: Gastrojejunal  anastomotic ulcer, gastric retention    Social History:  reports that she has never smoked. She has never used smokeless tobacco. She reports current alcohol use of about 7.0 standard drinks of alcohol per week. She reports that she does not use drugs. No current etoh use. Last drink 1 year ago Prior tobacco use No illicit drug use Lives at home with her husband   Allergies:  Allergies  Allergen Reactions  . Sulfa Antibiotics Anaphylaxis and Rash  . Sulfonamide Derivatives Anaphylaxis and Rash  . Dilaudid [Hydromorphone Hcl] Hives  . Penicillins Other (See Comments)    Has patient had a PCN reaction causing immediate rash, facial/tongue/throat swelling, SOB or lightheadedness with hypotension: No Has patient had a PCN reaction causing severe rash involving mucus membranes or skin necrosis: No Has patient had a PCN reaction that required hospitalization No Has patient had a PCN reaction occurring within the last 10 years: No If all of the above answers are "NO", then may proceed with Cephalosporin use.  Marland Kitchen. Hydromorphone Hcl Rash    (Not in a hospital admission)    Physical Exam: Blood pressure 138/64, pulse 73, temperature 98.7 F (37.1 C), temperature source Oral, resp. rate 19, height 5\' 2"  (1.575 m), weight 55 kg, SpO2 94 %. General: pleasant, WD/WN white female who is laying in bed in NAD HEENT: head is normocephalic, atraumatic.  Sclera are noninjected.  PERRL.  Ears and nose without any masses or lesions.  Mouth is pink and moist. Dentition  fair Heart: regular, rate, and rhythm.  Normal s1,s2. No obvious murmurs, gallops, or rubs noted.  Palpable pedal pulses bilaterally  Lungs: CTAB, no wheezes, rhonchi, or rales noted.  Respiratory effort nonlabored Abd: Soft, ND, tenderness of the lower abdomen (LLQ>suprapubic>RLQ) without r/r/g. +BS, no masses, hernias, or organomegaly. Prior midline abdominal scars noted. MS: no BUE/BLE edema, calves soft and nontender Skin: warm and dry with no masses, lesions, or rashes Psych: A&Ox3 (place, self, situation) with an appropriate affect  Neuro: cranial nerves grossly intact, equal strength in BUE/BLE bilaterally, normal speech, though process intact  Results for orders placed or performed during the hospital encounter of 11/23/19 (from the past 48 hour(s))  Urinalysis, Routine w reflex microscopic     Status: Abnormal   Collection Time: 11/23/19  4:55 AM  Result Value Ref Range   Color, Urine YELLOW YELLOW   APPearance CLOUDY (A) CLEAR   Specific Gravity, Urine 1.004 (L) 1.005 - 1.030   pH 6.0 5.0 - 8.0   Glucose, UA NEGATIVE NEGATIVE mg/dL   Hgb urine dipstick MODERATE (A) NEGATIVE   Bilirubin Urine NEGATIVE NEGATIVE   Ketones, ur NEGATIVE NEGATIVE mg/dL  Protein, ur NEGATIVE NEGATIVE mg/dL   Nitrite NEGATIVE NEGATIVE   Leukocytes,Ua LARGE (A) NEGATIVE   RBC / HPF 11-20 0 - 5 RBC/hpf   WBC, UA >50 (H) 0 - 5 WBC/hpf   Bacteria, UA FEW (A) NONE SEEN   Squamous Epithelial / LPF 0-5 0 - 5   Mucus PRESENT    Amorphous Crystal PRESENT    Non Squamous Epithelial 0-5 (A) NONE SEEN    Comment: Performed at [facility], [address] [place], [place] [address]  CBC with Differential     Status: Abnormal   Collection Time: 11/23/19  5:11 AM  Result Value Ref Range   WBC 23.2 (H) 4.0 - 10.5 K/uL   RBC 4.21 3.87 - 5.11 MIL/uL   Hemoglobin 13.3 12.0 - 15.0 g/dL   HCT 60.4 54.0 - 98.1 %   MCV 91.9 80.0 - 100.0 fL   MCH 31.6 26.0 - 34.0 pg   MCHC 34.4 30.0 - 36.0 g/dL   RDW 19.1  47.8 - 29.5 %   Platelets 436 (H) 150 - 400 K/uL   nRBC 0.0 0.0 - 0.2 %   Neutrophils Relative % 86 %   Neutro Abs 20.1 (H) 1.7 - 7.7 K/uL   Lymphocytes Relative 3 %   Lymphs Abs 0.8 0.7 - 4.0 K/uL   Monocytes Relative 9 %   Monocytes Absolute 2.0 (H) 0.1 - 1.0 K/uL   Eosinophils Relative 0 %   Eosinophils Absolute 0.0 0.0 - 0.5 K/uL   Basophils Relative 1 %   Basophils Absolute 0.1 0.0 - 0.1 K/uL   Immature Granulocytes 1 %   Abs Immature Granulocytes 0.23 (H) 0.00 - 0.07 K/uL    Comment: Performed at [facility], [address] [place], [place] [address]  Comprehensive metabolic panel     Status: Abnormal   Collection Time: 11/23/19  5:11 AM  Result Value Ref Range   Sodium 121 (L) 135 - 145 mmol/L   Potassium 4.4 3.5 - 5.1 mmol/L   Chloride 88 (L) 98 - 111 mmol/L   CO2 21 (L) 22 - 32 mmol/L   Glucose, Bld 125 (H) 70 - 99 mg/dL    Comment: Glucose reference range applies only to samples taken after fasting for at least 8 hours.   BUN <5 (L) 8 - 23 mg/dL   Creatinine, Ser 8.65 0.44 - 1.00 mg/dL   Calcium 6.9 (L) 8.9 - 10.3 mg/dL   Total Protein 6.6 6.5 - 8.1 g/dL   Albumin 3.1 (L) 3.5 - 5.0 g/dL   AST 24 15 - 41 U/L   ALT 22 0 - 44 U/L   Alkaline Phosphatase 150 (H) 38 - 126 U/L   Total Bilirubin 0.9 0.3 - 1.2 mg/dL   GFR calc non Af Amer >60 >60 mL/min   GFR calc Af Amer >60 >60 mL/min   Anion gap 12 5 - 15    Comment: Performed at [facility], [address], [place], [place] [address]  Respiratory Panel by RT PCR (Flu A&B, Covid) - Nasopharyngeal Swab     Status: None   Collection Time: 11/23/19  6:34 AM   Specimen: Nasopharyngeal Swab  Result Value Ref Range   SARS Coronavirus 2 by RT PCR NEGATIVE NEGATIVE    Comment: (NOTE) SARS-CoV-2 target nucleic acids are NOT DETECTED. The SARS-CoV-2 RNA is generally detectable in upper respiratoy specimens during the acute phase of infection. The lowest concentration of SARS-CoV-2 viral  copies this assay can  detect is 131 copies/mL. A negative result does not preclude SARS-Cov-2 infection and should not be used as the sole basis for treatment or other patient management decisions. A negative result may occur with  improper specimen collection/handling, submission of specimen other than nasopharyngeal swab, presence of viral mutation(s) within the areas targeted by this assay, and inadequate number of viral copies (<131 copies/mL). A negative result must be combined with clinical observations, patient history, and epidemiological information. The expected result is Negative. Fact Sheet for Patients:  https://www.moore.com/ Fact Sheet for Healthcare Providers:  https://www.young.biz/ This test is not yet ap proved or cleared by the [place] FDA and  has been authorized for detection and/or diagnosis of SARS-CoV-2 by FDA under an Emergency Use Authorization (EUA). This EUA will remain  in effect (meaning this test can be used) for the duration of the COVID-19 declaration under Section 564(b)(1) of the Act, 21 U.S.C. section 360bbb-3(b)(1), unless the authorization is terminated or revoked sooner.    Influenza A by PCR NEGATIVE NEGATIVE   Influenza B by PCR NEGATIVE NEGATIVE    Comment: (NOTE) The Xpert Xpress SARS-CoV-2/FLU/RSV assay is intended as an aid in  the diagnosis of influenza from Nasopharyngeal swab specimens and  should not be used as a sole basis for treatment. Nasal washings and  aspirates are unacceptable for Xpert Xpress SARS-CoV-2/FLU/RSV  testing. Fact Sheet for Patients: https://www.moore.com/ Fact Sheet for Healthcare Providers: https://www.young.biz/ This test is not yet approved or cleared by the [place] FDA and  has been authorized for detection and/or diagnosis of SARS-CoV-2 by  FDA under an Emergency Use Authorization (EUA). This EUA will remain  in effect (meaning this test  can be used) for the duration of the  Covid-19 declaration under Section 564(b)(1) of the Act, 21  U.S.C. section 360bbb-3(b)(1), unless the authorization is  terminated or revoked. Performed at [facility], [address], [place], [place] [address]   Lipase, blood     Status: None   Collection Time: 11/23/19  6:55 AM  Result Value Ref Range   Lipase 16 11 - 51 U/L    Comment: Performed at [facility], [address], [place], [place] [address]  Ethanol     Status: None   Collection Time: 11/23/19  6:56 AM  Result Value Ref Range   Alcohol, Ethyl (B) <10 <10 mg/dL    Comment: (NOTE) Lowest detectable limit for serum alcohol is 10 mg/dL. For medical purposes only. Performed at [facility], [address], [place], [place] [address]    CT ABDOMEN PELVIS W CONTRAST  Result Date: 11/23/2019 CLINICAL DATA:  Frequent urination, poor appetite EXAM: CT ABDOMEN AND PELVIS WITH CONTRAST TECHNIQUE: Multidetector CT imaging of the abdomen and pelvis was performed using the standard protocol following bolus administration of intravenous contrast. CONTRAST:  OMNIPAQUE IOHEXOL 300 MG/ML  SOLN COMPARISON:  04/12/2019 FINDINGS: Lower chest: No acute abnormality. Hepatobiliary: Unchanged 1.0 cm low-density lesion at the right hepatic dome, likely cyst or hemangioma. No new focal liver lesions. There may be a trace amount of layering sludge or small stones within the gallbladder lumen. No inflammatory changes are seen. No biliary dilatation. Pancreas: Unremarkable. No pancreatic ductal dilatation or surrounding inflammatory changes. Spleen: Normal in size without focal abnormality. Adrenals/Urinary Tract: Unremarkable adrenal glands. Unchanged round low-density subcentimeter focus in the left kidney, too small to definitively characterize, but most likely represents cyst. Kidneys are otherwise unremarkable. No  renal stone or hydronephrosis. There is thickening of the left wall of the  urinary bladder adjacent to inflammatory change in the left hemipelvis. No intraluminal air within the bladder. Stomach/Bowel: Extensive sigmoid diverticulosis with long segment wall thickening, pericolonic inflammatory changes and free fluid. Large, complex multiloculated diverticular abscess adjacent to the sigmoid colon. Collection is difficult to accurately measure as it insinuates along the course of the inflamed sigmoid colon. The more inferior aspect of the collection is located within the posterior cul-de-sac and extends along the course of the inflamed sigmoid colon and abuts the left lateral aspect of the urinary bladder measuring up to 14 cm in maximal dimension (series 7, image 99; series 3, images 58-73). No bowel obstruction. 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 Vascular/Lymphatic: Aortoiliac atherosclerotic calcification without aneurysm. No lymphadenopathy. Reproductive: Loss of fat plane between the uterus and diverticular collection. Other: No large volume of free air. Musculoskeletal: No acute osseous findings. IMPRESSION: 1. Extensive sigmoid diverticulitis. Large, complex multiloculated diverticular abscess adjacent to the sigmoid colon measuring up to 14 cm in maximal dimension. 2. Thickening of the left wall of the urinary bladder adjacent to the inflammatory change in the left hemipelvis. No intraluminal air within the bladder to suggest fistula. These results were called by telephone at the time of interpretation on 11/23/2019 at 8:18 am to provider Dr Renaye Rakers, who verbally acknowledged these results. Electronically Signed   By: Duanne Guess D.O.   On: 11/23/2019 08:21    Anti-infectives (From admission, onward)   Start     Dose/Rate Route Frequency Ordered Stop   11/24/19 1000  cefTRIAXone (ROCEPHIN) 2 g in sodium chloride 0.9 % 100 mL IVPB     2 g 200 mL/hr over 30 Minutes Intravenous Every 24 hours 11/23/19 0858     11/23/19 0915  cefTRIAXone  (ROCEPHIN) 1 g in sodium chloride 0.9 % 100 mL IVPB     1 g 200 mL/hr over 30 Minutes Intravenous  Once 11/23/19 0903     11/23/19 0900  metroNIDAZOLE (FLAGYL) IVPB 500 mg     500 mg 100 mL/hr over 60 Minutes Intravenous Every 8 hours 11/23/19 0858     11/23/19 0900  cefTRIAXone (ROCEPHIN) 1 g in sodium chloride 0.9 % 100 mL IVPB  Status:  Discontinued     1 g 200 mL/hr over 30 Minutes Intravenous  Once 11/23/19 0858 11/23/19 0902   11/23/19 0845  piperacillin-tazobactam (ZOSYN) IVPB 3.375 g  Status:  Discontinued     3.375 g 12.5 mL/hr over 240 Minutes Intravenous Every 8 hours 11/23/19 0837 11/23/19 0857   11/23/19 0645  cefTRIAXone (ROCEPHIN) 1 g in sodium chloride 0.9 % 100 mL IVPB     1 g 200 mL/hr over 30 Minutes Intravenous  Once 11/23/19 5427 11/23/19 0736       Assessment/Plan 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 Prolonged QT UTI Hyponatremia - Per TRH -    Recurrent Sigmoid diverticulitis with abscess measuring ~14cm - Agree with IV abx - Bowel rest, IVF - Consultation to IR for possible drainage - No indication for emergency surgery at this time. We will follow along. We discussed that if she does not improve with above conservative therapies, that she may require an ex lap with colectomy and colostomy during hospitalization.   FEN - NPO, IVF VTE - SCDs, Lovenox ID - Rocephin/Flagyl (5/5 >>). Consider switching to Zosyn. WBC 23.2  Elmer Sow [place],  [facility] 11/23/2019, 9:47 AM Please see Amion for pager number during day hours 7:00am-4:30pm

## 2019-11-23 NOTE — ED Provider Notes (Signed)
[facility] EMERGENCY DEPARTMENT Provider Note   CSN: [id number] Arrival date & time: 11/23/19  0443     History Chief Complaint  Patient presents with  . Poor Appetite    [name] is a 69 y.o. female.  HPI     This is a 69 year old female with a history of alcohol abuse, cognitive disorder, duodenal ulcer with perforation, diverticulitis with abscess who presents with anorexia.  Her partner at the bedside provides most of the history.  He reports that she has some cognitive delay because of her chronic alcohol abuse.  She is no longer drinking alcohol per the patient and her husband.  He reports her last week she has had decreased appetite and is not eating well.  He states that the last time she had decreased appetite she ended up in the hospital for 2 weeks with a diverticular abscess.  She he states that she has lost approximately 10 pounds.  She denies any nausea, vomiting, or abdominal pain.  They do report that she has had nonbloody diarrhea over the last 24 hours.  No fevers or urinary symptoms.  Husband reports that she has been up all night and she appears slightly more confused than her normal.  Reviewed the patient's chart.  She was admitted to the hospital in September for a diverticular abscess.  She had an IR drain placed.  Past Medical History:  Diagnosis Date  . Alcoholism (HCC) 02/02/2009  . ANXIETY 01/21/2007  . Blood transfusion    hx of 2005   . DIVERTICULOSIS 07/09/2007  . Duodenal ulcer hemorrhage perforated 2006  . Headache(784.0) 05/31/2007   pt denies at 08/12/11 preop visit   . HYPERTENSION 06/23/2007  . HYPONATREMIA 02/02/2009   better off diuretic  . INSOMNIA 05/31/2007  . Iron deficiency anemia    pt denies at 08/12/11 visit   . NSAID-induced duodenal ulcer   . OSTEOPOROSIS 01/21/2007  . Raynaud's syndrome 06/23/2007  . Recurrent upper respiratory infection (URI)    pt reports slight cold using Nyquil prn at bedtime   .  VITAMIN B12 DEFICIENCY 10/02/2009  . Zoster 2012   twice - left groin/vagina    Patient Active Problem List   Diagnosis Date Noted  . Vitamin D deficiency 06/03/2019  . Diverticulitis of large intestine with abscess 04/05/2019  . Dementia (HCC) 04/05/2019  . Malnutrition of moderate degree 08/23/2017  . Hyponatremia 08/20/2017  . Anemia 08/20/2017  . Marginal ulcer, at gastrojejunostomy, resected 08/14/2011. 09/19/2011  . LUQ abdominal pain near former jejunostomy site 03/04/2011  . 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 SHINGLES 08/19/2010  . B12 deficiency 10/02/2009  . Iron deficiency anemia 07/09/2007  . DIVERTICULOSIS 07/09/2007  . Essential hypertension 06/23/2007  . 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 INSOMNIA 05/31/2007  . HEADACHE 05/31/2007  . Anxiety state 01/21/2007  . Alcoholism (HCC) 01/21/2007  . Osteoporosis 01/21/2007    Past Surgical History:  Procedure Laterality Date  . COLONOSCOPY  06/18/2007   angulated and stenotic sigmoid colon with severe sigmoid diverticulosis  . EXPLORATORY LAPAROTOMY  08/03/2004   1) duodenal ulcer oversew, pyloroplasty, anterior, posterior and truncal vagotomies  . EXPLORATORY LAPAROTOMY  10/20/2004   1) duodenal ulcer oversew  with exclusion2) side-side gastrojejunostomy 3) feeding jejunostomy  . IR RADIOLOGIST EVAL & MGMT  04/26/2019  . IR RADIOLOGIST EVAL & MGMT  05/10/2019  . IR RADIOLOGIST EVAL & MGMT  06/07/2019  . IR SINUS/FIST TUBE CHK-NON GI  04/12/2019  . LAPAROSCOPY  08/14/2011   Procedure: LAPAROSCOPY DIAGNOSTIC;  Surgeon: Shann Medal, MD;  Location: [facility];  Service: General;  Laterality: N/A;  . LAPAROTOMY  08/14/2011   Procedure: EXPLORATORY LAPAROTOMY;  Surgeon: Shann Medal, MD;  Location: [facility];  Service: General;  Laterality: N/A;  exploratory laparotomy, lysis of adhesions, revision gastrojejunostomy, upper endoscopy  . OPEN REDUCTION INTERNAL FIXATION (ORIF) DISTAL  RADIAL FRACTURE Right 05/01/2016   Procedure: OPEN REDUCTION INTERNAL FIXATION (ORIF) DISTAL RADIAL FRACTURE, right;  Surgeon: Leanora Cover, MD;  Location: [place];  Service: Orthopedics;  Laterality: Right;  . TUBAL LIGATION  1977  . UPPER GASTROINTESTINAL ENDOSCOPY  09/16/2007; 12/21/2008; 03/17/2011   2009: Normal 2010: Normal with enteroscopy2012: Gastrojejunal  anastomotic ulcer, gastric retention     OB History   No obstetric history on file.     Family History  Problem Relation Age of Onset  . Stroke Father     Social History   Tobacco Use  . Smoking status: Never Smoker  . Smokeless tobacco: Never Used  Substance Use Topics  . Alcohol use: Yes    Alcohol/week: 7.0 standard drinks    Types: 7 Cans of beer per week    Comment: 12 oz  . Drug use: No    Home Medications Prior to Admission medications   Medication Sig Start Date End Date Taking? Authorizing Provider  acetaminophen (TYLENOL) 500 MG tablet Take 500 mg by mouth every 6 (six) hours as needed for mild pain.     [provider]  amLODipine (NORVASC) 10 MG tablet TAKE 1 TABLET BY MOUTH EVERY DAY 10/28/19   Isaac Bliss, Rayford Halsted, MD  aspirin 81 MG chewable tablet Chew 1 tablet (81 mg total) by mouth daily. 04/15/19   Ghimire, Henreitta Leber, MD  folic acid (FOLVITE) 1 MG tablet Take 1 tablet (1 mg total) by mouth daily. 05/03/19   Isaac Bliss, Rayford Halsted, MD  gabapentin (NEURONTIN) 100 MG capsule TAKE 1 CAPSULE BY MOUTH TWICE A DAY 10/25/19   Isaac Bliss, Rayford Halsted, MD  pantoprazole (PROTONIX) 40 MG tablet TAKE 1 TABLET BY MOUTH EVERY DAY 10/28/19   Isaac Bliss, Rayford Halsted, MD  thiamine 100 MG tablet Take 1 tablet (100 mg total) by mouth daily. 06/10/19   Isaac Bliss, Rayford Halsted, MD    Allergies    Sulfa antibiotics, Sulfonamide derivatives, Dilaudid [hydromorphone hcl], Penicillins, and Hydromorphone hcl  Review of Systems   Review of Systems  Constitutional: Positive for  appetite change. Negative for fever.  Respiratory: Negative for shortness of breath.   Cardiovascular: Negative for chest pain.  Gastrointestinal: Positive for diarrhea. Negative for abdominal pain, nausea and vomiting.  Genitourinary: Negative for dysuria.  Psychiatric/Behavioral: Positive for confusion.  All other systems reviewed and are negative.   Physical Exam Updated Vital Signs BP 136/71 (BP Location: Left Arm)   Pulse 86   Temp 98.7 F (37.1 C) (Oral)   Resp 16   Ht 1.575 m (5\' 2" )   Wt 55 kg   SpO2 96%   BMI 22.18 kg/m   Physical Exam Vitals and nursing note reviewed.  Constitutional:      Appearance: She is well-developed. She is not ill-appearing.     Comments: Chronically ill-appearing, thin, no acute distress  HENT:     Head: Normocephalic and atraumatic.     Mouth/Throat:     Mouth: Mucous membranes are dry.  Eyes:     Pupils: Pupils are  equal, round, and reactive to light.  Cardiovascular:     Rate and Rhythm: Normal rate and regular rhythm.     Heart sounds: Normal heart sounds.  Pulmonary:     Effort: Pulmonary effort is normal. No respiratory distress.     Breath sounds: No wheezing.  Abdominal:     General: Bowel sounds are normal.     Palpations: Abdomen is soft.     Tenderness: There is no abdominal tenderness.     Comments: Midline scarring and left lower quadrant incision scarring noted, no tenderness to palpation or signs of peritonitis  Musculoskeletal:     Cervical back: Neck supple.     Right lower leg: No edema.     Left lower leg: No edema.  Skin:    General: Skin is warm and dry.  Neurological:     Mental Status: She is alert and oriented to person, place, and time.     Comments: No focal deficits, occasionally with inappropriate answers to questions  Psychiatric:        Mood and Affect: Mood normal.     ED Results / Procedures / Treatments   Labs (all labs ordered are listed, but only abnormal results are displayed) Labs  Reviewed  URINALYSIS, ROUTINE W REFLEX MICROSCOPIC - Abnormal; Notable for the following components:      Result Value   APPearance CLOUDY (*)    Specific Gravity, Urine 1.004 (*)    Hgb urine dipstick MODERATE (*)    Leukocytes,Ua LARGE (*)    WBC, UA >50 (*)    Bacteria, UA FEW (*)    Non Squamous Epithelial 0-5 (*)    All other components within normal limits  COMPREHENSIVE METABOLIC PANEL - Abnormal; Notable for the following components:   Sodium 121 (*)    Chloride 88 (*)    CO2 21 (*)    Glucose, Bld 125 (*)    BUN <5 (*)    Calcium 6.9 (*)    Albumin 3.1 (*)    Alkaline Phosphatase 150 (*)    All other components within normal limits  URINE CULTURE  RESPIRATORY PANEL BY RT PCR (FLU A&B, COVID)  CBC WITH DIFFERENTIAL/PLATELET  LIPASE, BLOOD  ETHANOL    EKG None  Radiology No results found.  Procedures Procedures (including critical care time)  Medications Ordered in ED Medications  sodium chloride 0.9 % bolus 1,000 mL (has no administration in time range)  0.9 %  sodium chloride infusion (has no administration in time range)  cefTRIAXone (ROCEPHIN) 1 g in sodium chloride 0.9 % 100 mL IVPB (has no administration in time range)    ED Course  I have reviewed the triage vital signs and the nursing notes.  Pertinent labs & imaging results that were available during my care of the patient were reviewed by me and considered in my medical decision making (see chart for details).    MDM Rules/Calculators/A&P                       Patient presents with decreased appetite and questionable worsening confusion.  Per family at bedside, this was her prior presentation when she had a diverticular abscess and infection.  She is overall nontoxic and afebrile.  She is technically oriented but occasionally answers questions inappropriately or perseverates.  She has no abdominal tenderness on exam.  Lab work obtained.  Patient was given fluids as she clinically appears dry.   Initial lab work notable for  sodium of 121.  She also has a urinalysis that appears infected.  We will culture and likely treat.  Patient was initially given 1 L of fluids.  She was started on 100 cc/h of normal saline given her hyponatremia.  Given her extensive history and similar presentation with intra-abdominal infection and abscess, will obtain CT to rule out other source of infection.  This is pending at time of signout.  If negative, she will need to be admitted given her severe hyponatremia and likely UTI.  She does not appear septic and is hemodynamically stable.  Final Clinical Impression(s) / ED Diagnoses Final diagnoses:  Hyponatremia  Acute cystitis without hematuria    Rx / DC Orders ED Discharge Orders    None       Shon Baton, MD 11/23/19 [phone]

## 2019-11-23 NOTE — ED Triage Notes (Addendum)
Patient reports poor appetite for several days , denies fever or chills , no emesis , patient added diarrhea last night .

## 2019-11-23 NOTE — ED Notes (Addendum)
Pt transported to CT ?

## 2019-11-23 NOTE — ED Provider Notes (Signed)
Clinical Course as of Nov 23 1734  Wed Nov 23, 2019  0706 Pt signed out to me by Dr Wilkie Aye.  Briefly 69 yo female w/ hx of chronic etoh use presenting with abdominal pain.   Hx of diverticular abscess in December.  Back again with poor appetite, nonbloody diarrhea, no abdominal pain.  WBC 23.2.  Afebrile.  Possible UTI.  Also hyponatremic.  Pending CT scan.  Received Rocephin for UTI.  Anticipate admission   [MT]  0829 I updated the patient and her husband, both are agreeable to staying.  I spoke to Casimiro Needle the Gen Surgery PA and they will come see the patient and make the determination about involving IR.  He asks for a medical admission for the electrolyte deficiencies.  I will page the medicine team.   [MT]  0831 Ordered IV zosyn for more BS coverage of antibiotic infection.     [MT]  3557 IMPRESSION: 1. Extensive sigmoid diverticulitis. Large, complex multiloculated diverticular abscess adjacent to the sigmoid colon measuring up to 14 cm in maximal dimension. 2. Thickening of the left wall of the urinary bladder adjacent to the inflammatory change in the left hemipelvis. No intraluminal air within the bladder to suggest fistula.   [MT]  [phone] Signout given to Dr Katrinka Blazing hospitalist   [MT]    Clinical Course User Index [MT] Terald Sleeper, MD      Terald Sleeper, MD 11/23/19 [phone]

## 2019-11-23 NOTE — H&P (Addendum)
History and Physical    [name] CZY:[id number] DOB: Nov 05, 1950 DOA: 11/23/2019  Referring MD/NP/PA: Alvester Chou, MD PCP: [name], Kirsten Patricia, MD  Patient coming from: Home  Chief Complaint: Poor appetite  I have personally briefly reviewed patient's old medical records in [facility]   HPI: [name] is a 69 y.o. female with medical history significant of hypertension, hyponatremia, iron deficiency anemia, alcohol abuse, duodenal ulcer, diverticular abscess, and GI bleed presented with poor appetite.  History is obtained from patient's husband who is present at bedside as she has some cognitive issues at baseline due to previous history of alcohol abuse.  He notes that she has not drank alcohol in over 1 year.  Over the last week she has had a decrease in her appetite and has not stopped eating.  Associated symptoms include weight loss, insomnia, and pus coming out of her vagina.  Patient appeared to be more confused and would ruminate on certain things with repetitive questions.  He notes that she had similar symptoms when she had a sigmoid diverticular abscess 03/2019.  Patient has not complained of any pain although was noted to be tender to palpation.  ED Course: Upon admission into the emergency department patient was noted to have stable vital signs.  Labs significant for WBC 23.2, platelets 436, sodium 121, chloride 88, calcium 6.9, and albumin 3.1.  Urinalysis was positive for signs of infection.  Influenza and COVID-19 screening were negative.  CT scan of the abdomen pelvis with contrast revealed colitis with abscess measuring up to 14 cm.  General surgery was consulted.  Patient was started on Rocephin initially.  Review of Systems  Unable to perform ROS: Mental status change  Constitutional: Positive for malaise/fatigue.  Genitourinary:       Positive for pus discharge   Psychiatric/Behavioral: The patient has insomnia.        Confusion    Past  Medical History:  Diagnosis Date  . Alcoholism (HCC) 02/02/2009  . ANXIETY 01/21/2007  . Blood transfusion    hx of 2005   . DIVERTICULOSIS 07/09/2007  . Duodenal ulcer hemorrhage perforated 2006  . Headache(784.0) 05/31/2007   pt denies at 08/12/11 preop visit   . HYPERTENSION 06/23/2007  . HYPONATREMIA 02/02/2009   better off diuretic  . INSOMNIA 05/31/2007  . Iron deficiency anemia    pt denies at 08/12/11 visit   . NSAID-induced duodenal ulcer   . OSTEOPOROSIS 01/21/2007  . Raynaud's syndrome 06/23/2007  . Recurrent upper respiratory infection (URI)    pt reports slight cold using Nyquil prn at bedtime   . VITAMIN B12 DEFICIENCY 10/02/2009  . Zoster 2012   twice - left groin/vagina    Past Surgical History:  Procedure Laterality Date  . COLONOSCOPY  06/18/2007   angulated and stenotic sigmoid colon with severe sigmoid diverticulosis  . EXPLORATORY LAPAROTOMY  08/03/2004   1) duodenal ulcer oversew, pyloroplasty, anterior, posterior and truncal vagotomies  . EXPLORATORY LAPAROTOMY  10/20/2004   1) duodenal ulcer oversew  with exclusion2) side-side gastrojejunostomy 3) feeding jejunostomy  . IR RADIOLOGIST EVAL & MGMT  04/26/2019  . IR RADIOLOGIST EVAL & MGMT  05/10/2019  . IR RADIOLOGIST EVAL & MGMT  06/07/2019  . IR SINUS/FIST TUBE CHK-NON GI  04/12/2019  . LAPAROSCOPY  08/14/2011   Procedure: LAPAROSCOPY DIAGNOSTIC;  Surgeon: Kandis Cocking, MD;  Location: [facility];  Service: General;  Laterality: N/A;  . LAPAROTOMY  08/14/2011   Procedure: EXPLORATORY LAPAROTOMY;  Surgeon: Kandis Cocking, MD;  Location: [facility];  Service: General;  Laterality: N/A;  exploratory laparotomy, lysis of adhesions, revision gastrojejunostomy, upper endoscopy  . OPEN REDUCTION INTERNAL FIXATION (ORIF) DISTAL RADIAL FRACTURE Right 05/01/2016   Procedure: OPEN REDUCTION INTERNAL FIXATION (ORIF) DISTAL RADIAL FRACTURE, right;  Surgeon: Betha Loa, MD;  Location: [facility];  Service: Orthopedics;   Laterality: Right;  . TUBAL LIGATION  1977  . UPPER GASTROINTESTINAL ENDOSCOPY  09/16/2007; 12/21/2008; 03/17/2011   2009: Normal 2010: Normal with enteroscopy2012: Gastrojejunal  anastomotic ulcer, gastric retention     reports that she has never smoked. She has never used smokeless tobacco. She reports current alcohol use of about 7.0 standard drinks of alcohol per week. She reports that she does not use drugs.  Allergies  Allergen Reactions  . Sulfa Antibiotics Anaphylaxis and Rash  . Sulfonamide Derivatives Anaphylaxis and Rash  . Dilaudid [Hydromorphone Hcl] Hives  . Penicillins Other (See Comments)    Has patient had a PCN reaction causing immediate rash, facial/tongue/throat swelling, SOB or lightheadedness with hypotension: No Has patient had a PCN reaction causing severe rash involving mucus membranes or skin necrosis: No Has patient had a PCN reaction that required hospitalization No Has patient had a PCN reaction occurring within the last 10 years: No If all of the above answers are "NO", then may proceed with Cephalosporin use.  Marland Kitchen Hydromorphone Hcl Rash    Family History  Problem Relation Age of Onset  . Stroke Father     Prior to Admission medications   Medication Sig Start Date End Date Taking? Authorizing Provider  amLODipine (NORVASC) 10 MG tablet TAKE 1 TABLET BY MOUTH EVERY DAY Patient taking differently: Take 10 mg by mouth daily.  10/28/19  Yes [name], Kirsten Patricia, MD  aspirin 81 MG chewable tablet Chew 1 tablet (81 mg total) by mouth daily. 04/15/19  Yes Ghimire, Werner Lean, MD  denosumab (PROLIA) 60 MG/ML SOSY injection Inject 60 mg into the skin every 6 (six) months.   Yes [provider]  folic acid (FOLVITE) 1 MG tablet Take 1 tablet (1 mg total) by mouth daily. 05/03/19  Yes [name], Kirsten Patricia, MD  gabapentin (NEURONTIN) 100 MG capsule TAKE 1 CAPSULE BY MOUTH TWICE A DAY Patient taking differently: Take 100 mg by mouth 2 (two) times daily.   10/25/19  Yes [name], Kirsten Patricia, MD  Multiple Vitamin (MULTIVITAMIN WITH MINERALS) TABS tablet Take 1 tablet by mouth daily.   Yes [provider]  pantoprazole (PROTONIX) 40 MG tablet TAKE 1 TABLET BY MOUTH EVERY DAY Patient taking differently: Take 40 mg by mouth daily.  10/28/19  Yes [name], Kirsten Patricia, MD  thiamine 100 MG tablet Take 1 tablet (100 mg total) by mouth daily. Patient not taking: Reported on 11/23/2019 06/10/19   [name], Kirsten Patricia, MD    Physical Exam:  Constitutional: Elderly female who appears to be in no acute distress Vitals:   11/23/19 0450 11/23/19 0454 11/23/19 0700 11/23/19 0800  BP: 136/71  (!) 126/53 131/69  Pulse: 86  69 73  Resp: 16  17 15   Temp: 98.7 F (37.1 C)     TempSrc: Oral     SpO2: 96%  97% 94%  Weight:  55 kg    Height:  5\' 2"  (1.575 m)     Eyes: PERRL, lids and conjunctivae normal ENMT: Mucous membranes are moist. Posterior pharynx clear of any exudate or lesions.  Neck: normal,  supple, no masses, no thyromegaly Respiratory: clear to auscultation bilaterally, no wheezing, no crackles. Normal respiratory effort. No accessory muscle use.  Cardiovascular: Regular rate and rhythm, no murmurs / rubs / gallops. No extremity edema. 2+ pedal pulses. No carotid bruits.  Abdomen: Lower abdominal tenderness appreciated most notably on the left lower quadrant.  Bowel sounds still present. Musculoskeletal: no clubbing / cyanosis. No joint deformity upper and lower extremities. Good ROM, no contractures. Normal muscle tone.  Skin: no rashes, lesions, ulcers. No induration Neurologic: CN 2-12 grossly intact. Sensation intact, DTR normal. Strength 5/5 in all 4.  Psychiatric:  alert and oriented x person and place.  Patient repetitively talked about pus draining from her vagina.   Labs on Admission: I have personally reviewed following labs and imaging studies  CBC: Recent Labs  Lab 11/23/19 0511  WBC 23.2*  NEUTROABS 20.1*   HGB 13.3  HCT 38.7  MCV 91.9  PLT 436*   Basic Metabolic Panel: Recent Labs  Lab 11/23/19 0511  NA 121*  K 4.4  CL 88*  CO2 21*  GLUCOSE 125*  BUN <5*  CREATININE 0.54  CALCIUM 6.9*   GFR: Estimated Creatinine Clearance: 54 mL/min (by C-G formula based on SCr of 0.54 mg/dL). Liver Function Tests: Recent Labs  Lab 11/23/19 0511  AST 24  ALT 22  ALKPHOS 150*  BILITOT 0.9  PROT 6.6  ALBUMIN 3.1*   Recent Labs  Lab 11/23/19 0655  LIPASE 16   No results for input(s): AMMONIA in the last 168 hours. Coagulation Profile: No results for input(s): INR, PROTIME in the last 168 hours. Cardiac Enzymes: No results for input(s): CKTOTAL, CKMB, CKMBINDEX, TROPONINI in the last 168 hours. BNP (last 3 results) No results for input(s): PROBNP in the last 8760 hours. HbA1C: No results for input(s): HGBA1C in the last 72 hours. CBG: No results for input(s): GLUCAP in the last 168 hours. Lipid Profile: No results for input(s): CHOL, HDL, LDLCALC, TRIG, CHOLHDL, LDLDIRECT in the last 72 hours. Thyroid Function Tests: No results for input(s): TSH, T4TOTAL, FREET4, T3FREE, THYROIDAB in the last 72 hours. Anemia Panel: No results for input(s): VITAMINB12, FOLATE, FERRITIN, TIBC, IRON, RETICCTPCT in the last 72 hours. Urine analysis:    Component Value Date/Time   COLORURINE YELLOW 11/23/2019 0455   APPEARANCEUR CLOUDY (A) 11/23/2019 0455   LABSPEC 1.004 (L) 11/23/2019 0455   PHURINE 6.0 11/23/2019 0455   GLUCOSEU NEGATIVE 11/23/2019 0455   HGBUR MODERATE (A) 11/23/2019 0455   BILIRUBINUR NEGATIVE 11/23/2019 0455   BILIRUBINUR N 08/19/2017 1604   KETONESUR NEGATIVE 11/23/2019 0455   PROTEINUR NEGATIVE 11/23/2019 0455   UROBILINOGEN 0.2 08/19/2017 1604   UROBILINOGEN 0.2 02/01/2014 0350   NITRITE NEGATIVE 11/23/2019 0455   LEUKOCYTESUR LARGE (A) 11/23/2019 0455   Sepsis Labs: Recent Results (from the past 240 hour(s))  Respiratory Panel by RT PCR (Flu A&B, Covid) -  Nasopharyngeal Swab     Status: None   Collection Time: 11/23/19  6:34 AM   Specimen: Nasopharyngeal Swab  Result Value Ref Range Status   SARS Coronavirus 2 by RT PCR NEGATIVE NEGATIVE Final    Comment: (NOTE) SARS-CoV-2 target nucleic acids are NOT DETECTED. The SARS-CoV-2 RNA is generally detectable in upper respiratoy specimens during the acute phase of infection. The lowest concentration of SARS-CoV-2 viral copies this assay can detect is 131 copies/mL. A negative result does not preclude SARS-Cov-2 infection and should not be used as the sole basis for treatment or other patient management  decisions. A negative result may occur with  improper specimen collection/handling, submission of specimen other than nasopharyngeal swab, presence of viral mutation(s) within the areas targeted by this assay, and inadequate number of viral copies (<131 copies/mL). A negative result must be combined with clinical observations, patient history, and epidemiological information. The expected result is Negative. Fact Sheet for Patients:  https://www.moore.com/ Fact Sheet for Healthcare Providers:  https://www.young.biz/ This test is not yet ap proved or cleared by the [place] FDA and  has been authorized for detection and/or diagnosis of SARS-CoV-2 by FDA under an Emergency Use Authorization (EUA). This EUA will remain  in effect (meaning this test can be used) for the duration of the COVID-19 declaration under Section 564(b)(1) of the Act, 21 U.S.C. section 360bbb-3(b)(1), unless the authorization is terminated or revoked sooner.    Influenza A by PCR NEGATIVE NEGATIVE Final   Influenza B by PCR NEGATIVE NEGATIVE Final    Comment: (NOTE) The Xpert Xpress SARS-CoV-2/FLU/RSV assay is intended as an aid in  the diagnosis of influenza from Nasopharyngeal swab specimens and  should not be used as a sole basis for treatment. Nasal washings and  aspirates  are unacceptable for Xpert Xpress SARS-CoV-2/FLU/RSV  testing. Fact Sheet for Patients: https://www.moore.com/ Fact Sheet for Healthcare Providers: https://www.young.biz/ This test is not yet approved or cleared by the [place] FDA and  has been authorized for detection and/or diagnosis of SARS-CoV-2 by  FDA under an Emergency Use Authorization (EUA). This EUA will remain  in effect (meaning this test can be used) for the duration of the  Covid-19 declaration under Section 564(b)(1) of the Act, 21  U.S.C. section 360bbb-3(b)(1), unless the authorization is  terminated or revoked. Performed at [facility], [address], [place], [place] [address]      Radiological Exams on Admission: CT ABDOMEN PELVIS W CONTRAST  Result Date: 11/23/2019 CLINICAL DATA:  Frequent urination, poor appetite EXAM: CT ABDOMEN AND PELVIS WITH CONTRAST TECHNIQUE: Multidetector CT imaging of the abdomen and pelvis was performed using the standard protocol following bolus administration of intravenous contrast. CONTRAST:  OMNIPAQUE IOHEXOL 300 MG/ML  SOLN COMPARISON:  04/12/2019 FINDINGS: Lower chest: No acute abnormality. Hepatobiliary: Unchanged 1.0 cm low-density lesion at the right hepatic dome, likely cyst or hemangioma. No new focal liver lesions. There may be a trace amount of layering sludge or small stones within the gallbladder lumen. No inflammatory changes are seen. No biliary dilatation. Pancreas: Unremarkable. No pancreatic ductal dilatation or surrounding inflammatory changes. Spleen: Normal in size without focal abnormality. Adrenals/Urinary Tract: Unremarkable adrenal glands. Unchanged round low-density subcentimeter focus in the left kidney, too small to definitively characterize, but most likely represents cyst. Kidneys are otherwise unremarkable. No renal stone or hydronephrosis. There is thickening of the left wall of the urinary bladder adjacent  to inflammatory change in the left hemipelvis. No intraluminal air within the bladder. Stomach/Bowel: Extensive sigmoid diverticulosis with long segment wall thickening, pericolonic inflammatory changes and free fluid. Large, complex multiloculated diverticular abscess adjacent to the sigmoid colon. Collection is difficult to accurately measure as it insinuates along the course of the inflamed sigmoid colon. The more inferior aspect of the collection is located within the posterior cul-de-sac and extends along the course of the inflamed sigmoid colon and abuts the left lateral aspect of the urinary bladder measuring up to 14 cm in maximal dimension (series 7, image 99; series 3, images 58-73). No bowel obstruction. 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  Vascular/Lymphatic: Aortoiliac atherosclerotic calcification without aneurysm. No lymphadenopathy. Reproductive: Loss of fat plane between the uterus and diverticular collection. Other: No large volume of free air. Musculoskeletal: No acute osseous findings. IMPRESSION: 1. Extensive sigmoid diverticulitis. Large, complex multiloculated diverticular abscess adjacent to the sigmoid colon measuring up to 14 cm in maximal dimension. 2. Thickening of the left wall of the urinary bladder adjacent to the inflammatory change in the left hemipelvis. No intraluminal air within the bladder to suggest fistula. These results were called by telephone at the time of interpretation on 11/23/2019 at 8:18 am to provider Dr Renaye Rakers, who verbally acknowledged these results. Electronically Signed   By: Duanne Guess D.O.   On: 11/23/2019 08:21    EKG: Independently reviewed.  Sinus rhythm at 74 bpm with QTc 512.  Assessment/Plan Sigmoid diverticulitis with abscess: Recurrent.  Patient presented with complaints of decreased appetite and increased confusion.  CT imaging significant for sigmoid diverticulitis with 14 cm abscess.  Patient had been placed on  empiric antibiotics of Rocephin.  General surgery was consulted, but recommended IR drainage. -Admit to a medical telemetry bed -Check blood cultures -N.p.o. -Continue empiric antibiotics Rocephin (due to reported allergy to penicillins) and add on metronidazole IV  -Appreciate general surgery and IR consultative services, will follow-up for additional recommendations  Urinary tract infection: Acute.  Urinalysis positive for large leukocytes, few bacteria, 11-20 RBCs, moderate hemoglobin, and greater than 50 WBCs. -Follow-up urine culture -Continue empiric antibiotics as seen above  Leukocytosis: Acute.  WBC elevated at 23.2 with signs of left shift. -Recheck WBC in a.m.  Hyponatremia: Acute on chronic.  Sodium 121 on admission, but baseline appears to run in the 127-130s.  Given recent decreased appetite suspect a hypovolemic hyponatremia as the cause. -Gentle IV fluids normal saline at 75 mL/h -Continue to monitor BMP  Hypocalcemia: Acute.  Calcium 6.9 and was 7.6 when corrected for albumin. -Give 2 g of calcium gluconate IV -Continue to monitor and replace as needed  Metabolic encephalopathy: On chronic.  Patient noted to have some cognitive deficits due to previous history of alcohol abuse.  However, husband notes that patient was more confused than normal.  Suspect infection as likely cause of symptoms. -Continue to monitor  History of alcohol abuse: Patient has not drank alcohol in over 1 year.  Prolonged QT interval: Acute.  QTC noted to be 512 on admission. -Avoid QT prolonging medication -Correct electrolyte abnormalities  DVT prophylaxis: Lovenox Code Status: Full Family Communication: Plan of care discussed with the patient's husband present at bedside Disposition Plan: Possible discharge home in 2 to 3 days once medically stable Consults called: General surgery, IR Admission status: Inpatient  Clydie Braun MD [facility] Pager [phone]   If  7PM-7AM, please contact night-coverage [email or web address] Password [email or web address]  11/23/2019, 8:55 AM

## 2019-11-23 NOTE — Procedures (Signed)
  Procedure: CT guided RLQ abscess drain placement  15ml thin purulent for GS, C&S EBL:   minimal Complications:  none immediate  See full dictation in [employer].  Thora Lance MD Main # [phone] Pager  [phone]

## 2019-11-23 NOTE — ED Notes (Signed)
Pt transported to IR 

## 2019-11-23 NOTE — ED Notes (Signed)
Phlebotomy currently in room to obtain blood cultures. Will admin scheduled IV abx following retrieval of blood cultures.

## 2019-11-23 NOTE — ED Notes (Signed)
Pt returned from IR , family at bedside

## 2019-11-23 NOTE — H&P (Addendum)
Chief Complaint: Patient was seen in consultation today for  Chief Complaint  Patient presents with  . Poor Appetite   at the request of Alferd Apa, [facility].  Supervising Physician: Arne Cleveland  Patient Status: [facility] - ED  History of Present Illness: [name] is a 69 y.o. female with a history of alcoholism and diverticulitis who presented to the [facility] ED 11/23/2019 with a decreased appetite and confusion. Patient was last admitted 04/05/2019 with a similar presentation for severe sigmoid diverticulitis with a 9.7 x 3.7 x 3.8 cm diverticular abscess that improved with IR drain placement on 04/05/2019 and IV antibiotics. Other past medical history includes exploratory laparotomy for a perforated ulcer in 2006 with duodenal repair by Dr. Hulen Skains and a gastrojejunostomy revision by Dr. Lucia Gaskins in 2013.   CT scan 11/23/2019: 1. Extensive sigmoid diverticulitis. Large, complex multiloculated diverticular abscess adjacent to the sigmoid colon measuring up to 14 cm in maximal dimension. 2. Thickening of the left wall of the urinary bladder adjacent to the inflammatory change in the left hemipelvis. No intraluminal air within the bladder to suggest fistula.   Interventional Radiology has been asked to evaluate this patient for an image-guided intraabdominal abscess aspiration/drain placement.  Past Medical History:  Diagnosis Date  . Alcoholism ([place]) 02/02/2009  . ANXIETY 01/21/2007  . Blood transfusion    hx of 2005   . DIVERTICULOSIS 07/09/2007  . Duodenal ulcer hemorrhage perforated 2006  . Headache(784.0) 05/31/2007   pt denies at 08/12/11 preop visit   . HYPERTENSION 06/23/2007  . HYPONATREMIA 02/02/2009   better off diuretic  . INSOMNIA 05/31/2007  . Iron deficiency anemia    pt denies at 08/12/11 visit   . NSAID-induced duodenal ulcer   . OSTEOPOROSIS 01/21/2007  . Raynaud's syndrome 06/23/2007  . Recurrent upper respiratory infection (URI)    pt reports  slight cold using Nyquil prn at bedtime   . VITAMIN B12 DEFICIENCY 10/02/2009  . Zoster 2012   twice - left groin/vagina    Past Surgical History:  Procedure Laterality Date  . COLONOSCOPY  06/18/2007   angulated and stenotic sigmoid colon with severe sigmoid diverticulosis  . EXPLORATORY LAPAROTOMY  08/03/2004   1) duodenal ulcer oversew, pyloroplasty, anterior, posterior and truncal vagotomies  . EXPLORATORY LAPAROTOMY  10/20/2004   1) duodenal ulcer oversew  with exclusion2) side-side gastrojejunostomy 3) feeding jejunostomy  . IR RADIOLOGIST EVAL & MGMT  04/26/2019  . IR RADIOLOGIST EVAL & MGMT  05/10/2019  . IR RADIOLOGIST EVAL & MGMT  06/07/2019  . IR SINUS/FIST TUBE CHK-NON GI  04/12/2019  . LAPAROSCOPY  08/14/2011   Procedure: LAPAROSCOPY DIAGNOSTIC;  Surgeon: Shann Medal, MD;  Location: [facility];  Service: General;  Laterality: N/A;  . LAPAROTOMY  08/14/2011   Procedure: EXPLORATORY LAPAROTOMY;  Surgeon: Shann Medal, MD;  Location: [facility];  Service: General;  Laterality: N/A;  exploratory laparotomy, lysis of adhesions, revision gastrojejunostomy, upper endoscopy  . OPEN REDUCTION INTERNAL FIXATION (ORIF) DISTAL RADIAL FRACTURE Right 05/01/2016   Procedure: OPEN REDUCTION INTERNAL FIXATION (ORIF) DISTAL RADIAL FRACTURE, right;  Surgeon: Leanora Cover, MD;  Location: [place];  Service: Orthopedics;  Laterality: Right;  . TUBAL LIGATION  1977  . UPPER GASTROINTESTINAL ENDOSCOPY  09/16/2007; 12/21/2008; 03/17/2011   2009: Normal 2010: Normal with enteroscopy2012: Gastrojejunal  anastomotic ulcer, gastric retention    Allergies: Sulfa antibiotics, Sulfonamide derivatives, Dilaudid [hydromorphone hcl], Penicillins, and Hydromorphone hcl  Medications: Prior to Admission medications  Medication Sig Start Date End Date Taking? Authorizing Provider  amLODipine (NORVASC) 10 MG tablet TAKE 1 TABLET BY MOUTH EVERY DAY Patient taking differently: Take 10 mg by mouth daily.   10/28/19  Yes Philip Aspen, Limmie Patricia, MD  aspirin 81 MG chewable tablet Chew 1 tablet (81 mg total) by mouth daily. 04/15/19  Yes Ghimire, Werner Lean, MD  denosumab (PROLIA) 60 MG/ML SOSY injection Inject 60 mg into the skin every 6 (six) months.   Yes [provider]  folic acid (FOLVITE) 1 MG tablet Take 1 tablet (1 mg total) by mouth daily. 05/03/19  Yes Philip Aspen, Limmie Patricia, MD  gabapentin (NEURONTIN) 100 MG capsule TAKE 1 CAPSULE BY MOUTH TWICE A DAY Patient taking differently: Take 100 mg by mouth 2 (two) times daily.  10/25/19  Yes Philip Aspen, Limmie Patricia, MD  Multiple Vitamin (MULTIVITAMIN WITH MINERALS) TABS tablet Take 1 tablet by mouth daily.   Yes [provider]  pantoprazole (PROTONIX) 40 MG tablet TAKE 1 TABLET BY MOUTH EVERY DAY Patient taking differently: Take 40 mg by mouth daily.  10/28/19  Yes Philip Aspen, Limmie Patricia, MD  thiamine 100 MG tablet Take 1 tablet (100 mg total) by mouth daily. Patient not taking: Reported on 11/23/2019 06/10/19   Philip Aspen, Limmie Patricia, MD     Family History  Problem Relation Age of Onset  . Stroke Father     Social History   Socioeconomic History  . Marital status: Married    Spouse name: Not on file  . Number of children: 1  . Years of education: Not on file  . Highest education level: Not on file  Occupational History  . Occupation: retired    Associate Professor: UNEMPLOYED  Tobacco Use  . Smoking status: Never Smoker  . Smokeless tobacco: Never Used  Substance and Sexual Activity  . Alcohol use: Yes    Alcohol/week: 7.0 standard drinks    Types: 7 Cans of beer per week    Comment: 12 oz  . Drug use: No  . Sexual activity: Not on file  Other Topics Concern  . Not on file  Social History Narrative   Married retired 1 child   Beer drinker 1 a day, no tobacco no drug use   Social Determinants of Corporate investment banker Strain:   . Difficulty of Paying Living Expenses:   Food Insecurity:   .  Worried About Programme researcher, broadcasting/film/video in the Last Year:   . Barista in the Last Year:   Transportation Needs:   . Freight forwarder (Medical):   Marland Kitchen Lack of Transportation (Non-Medical):   Physical Activity:   . Days of Exercise per Week:   . Minutes of Exercise per Session:   Stress:   . Feeling of Stress :   Social Connections:   . Frequency of Communication with Friends and Family:   . Frequency of Social Gatherings with Friends and Family:   . Attends Religious Services:   . Active Member of Clubs or Organizations:   . Attends Banker Meetings:   Marland Kitchen Marital Status:     Review of Systems: A 12 point ROS discussed and pertinent positives are indicated in the HPI above.  All other systems are negative.  Review of Systems  Constitutional: Positive for appetite change. Negative for chills and fever.  Respiratory: Negative for cough and shortness of breath.   Cardiovascular: Negative for chest pain and leg swelling.  Gastrointestinal:  Positive for diarrhea. Negative for abdominal distention and abdominal pain.  Musculoskeletal: Negative for back pain.  Neurological: Negative for dizziness and headaches.  Psychiatric/Behavioral: Positive for confusion and decreased concentration.    Vital Signs: BP 120/61   Pulse 66   Temp 98.7 F (37.1 C) (Oral)   Resp 16   Ht 5\' 2"  (1.575 m)   Wt 121 lb 4.1 oz (55 kg)   SpO2 100%   BMI 22.18 kg/m   Physical Exam Constitutional:      General: She is not in acute distress. Cardiovascular:     Rate and Rhythm: Normal rate and regular rhythm.     Pulses: Normal pulses.     Heart sounds: Normal heart sounds.  Pulmonary:     Effort: Pulmonary effort is normal.     Breath sounds: Normal breath sounds.  Abdominal:     General: Bowel sounds are normal.     Palpations: Abdomen is soft.     Tenderness: There is no abdominal tenderness. There is no guarding.  Musculoskeletal:        General: Normal range of motion.    Skin:    General: Skin is warm and dry.  Neurological:     Mental Status: She is alert. Mental status is at baseline.     Comments: Alert to person and place, disoriented to time and situation.      Imaging: CT ABDOMEN PELVIS W CONTRAST  Result Date: 11/23/2019 CLINICAL DATA:  Frequent urination, poor appetite EXAM: CT ABDOMEN AND PELVIS WITH CONTRAST TECHNIQUE: Multidetector CT imaging of the abdomen and pelvis was performed using the standard protocol following bolus administration of intravenous contrast. CONTRAST:  01/23/2020 OMNIPAQUE IOHEXOL 300 MG/ML  SOLN COMPARISON:  04/12/2019 FINDINGS: Lower chest: No acute abnormality. Hepatobiliary: Unchanged 1.0 cm low-density lesion at the right hepatic dome, likely cyst or hemangioma. No new focal liver lesions. There may be a trace amount of layering sludge or small stones within the gallbladder lumen. No inflammatory changes are seen. No biliary dilatation. Pancreas: Unremarkable. No pancreatic ductal dilatation or surrounding inflammatory changes. Spleen: Normal in size without focal abnormality. Adrenals/Urinary Tract: Unremarkable adrenal glands. Unchanged round low-density subcentimeter focus in the left kidney, too small to definitively characterize, but most likely represents cyst. Kidneys are otherwise unremarkable. No renal stone or hydronephrosis. There is thickening of the left wall of the urinary bladder adjacent to inflammatory change in the left hemipelvis. No intraluminal air within the bladder. Stomach/Bowel: Extensive sigmoid diverticulosis with long segment wall thickening, pericolonic inflammatory changes and free fluid. Large, complex multiloculated diverticular abscess adjacent to the sigmoid colon. Collection is difficult to accurately measure as it insinuates along the course of the inflamed sigmoid colon. The more inferior aspect of the collection is located within the posterior cul-de-sac and extends along the course of the inflamed  sigmoid colon and abuts the left lateral aspect of the urinary bladder measuring up to 14 cm in maximal dimension (series 7, image 99; series 3, images 58-73). No bowel obstruction. 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 Vascular/Lymphatic: Aortoiliac atherosclerotic calcification without aneurysm. No lymphadenopathy. Reproductive: Loss of fat plane between the uterus and diverticular collection. Other: No large volume of free air. Musculoskeletal: No acute osseous findings. IMPRESSION: 1. Extensive sigmoid diverticulitis. Large, complex multiloculated diverticular abscess adjacent to the sigmoid colon measuring up to 14 cm in maximal dimension. 2. Thickening of the left wall of the urinary bladder adjacent to the inflammatory change in the left hemipelvis. No  intraluminal air within the bladder to suggest fistula. These results were called by telephone at the time of interpretation on 11/23/2019 at 8:18 am to provider Dr Renaye Rakers, who verbally acknowledged these results. Electronically Signed   By: Duanne Guess D.O.   On: 11/23/2019 08:21    Labs:  CBC: Recent Labs    04/10/19 0423 04/10/19 0423 04/11/19 0428 06/03/19 0749 11/23/19 0511  WBC 9.9  --  8.2 7.5 23.2*  HGB 13.0  --  12.0 13.6 13.3  HCT 36.9   < > 34.2* 39.4  40.0 38.7  PLT 391  --  387 279.0 436*   < > = values in this interval not displayed.    COAGS: Recent Labs    04/06/19 0404  INR 1.2  APTT 40*    BMP: Recent Labs    04/11/19 0428 04/11/19 0428 04/12/19 0359 04/14/19 0321 06/03/19 0749 11/23/19 0511  NA 130*   < > 134* 129* 135 121*  K 3.7   < > 4.1 4.5 5.1 4.4  CL 96*   < > 101 91* 95* 88*  CO2 26   < > 26 28 31  21*  GLUCOSE 122*   < > 99 106* 109* 125*  BUN 9   < > 10 12 14  <5*  CALCIUM 8.0*   < > 8.4* 8.6* 9.5 6.9*  CREATININE 0.35*   < > 0.51 0.45 0.58 0.54  GFRNONAA >60  --  >60 >60  --  >60  GFRAA >60  --  >60 >60  --  >60   < > = values in this interval not  displayed.    LIVER FUNCTION TESTS: Recent Labs    04/11/19 0428 04/14/19 0321 06/03/19 0749 11/23/19 0511  BILITOT <0.1* 0.4 0.6 0.9  AST 37 25 21 24   ALT 22 21 28 22   ALKPHOS 64 73 99 150*  PROT 5.2* 5.4* 6.7 6.6  ALBUMIN 2.6* 2.8* 4.4 3.1*    TUMOR MARKERS: No results for input(s): AFPTM, CEA, CA199, CHROMGRNA in the last 8760 hours.  Assessment and Plan: [name] is a 69 y.o. female with a history of alcoholism and diverticulitis who presented to the [facility] ED 11/23/2019 with decreased appetite and confusion. CT scan in the ED shows extensive sigmoid diverticulitis and a large, complex multiloculated diverticular abscess adjacent to the sigmoid colon measuring up to 14 cm in maximal dimension.  Dr. Loney Loh has reviewed this case and the patient will be seen in Interventional Radiology either today 11/23/2019 or tomorrow 11/24/2019 for a CT-guided intraabdominal abscess aspiration with possible drain placement.   Labs and vitals have been reviewed. The patient has been NPO since before her admission to the ED. The patient is unable to consent herself; unable to reach her husband by phone. Will obtain consent when her husband returns to the ED later this afternoon.   Addendum: Patient's husband is now at the bedside and would like to proceed with the abscess aspiration/drain placement. Risks and benefits discussed with the patient including bleeding, infection, damage to adjacent structures, bowel perforation/fistula connection, and sepsis.  All of the patient's questions were answered, patient is agreeable to proceed. Consent signed and placed in the Interventional Radiology consent box.   Thank you for this interesting consult.  I greatly enjoyed meeting [name] and look forward to participating in their care.  A copy of this report was sent to the requesting provider on this date.  Electronically Signed: Deanne Coffer, NP  11/23/2019, 11:58 AM   I spent a total  of 20 Minutes  in face to face in clinical consultation, greater than 50% of which was counseling/coordinating care for intraabdominal aspiration/drain placement.

## 2019-11-24 LAB — CBC
HCT: 36.9 % (ref 36.0–46.0)
Hemoglobin: 12.3 g/dL (ref 12.0–15.0)
MCH: 31.1 pg (ref 26.0–34.0)
MCHC: 33.3 g/dL (ref 30.0–36.0)
MCV: 93.4 fL (ref 80.0–100.0)
Platelets: 347 10*3/uL (ref 150–400)
RBC: 3.95 MIL/uL (ref 3.87–5.11)
RDW: 13.4 % (ref 11.5–15.5)
WBC: 11 10*3/uL — ABNORMAL HIGH (ref 4.0–10.5)
nRBC: 0 % (ref 0.0–0.2)

## 2019-11-24 LAB — URINE CULTURE: Culture: NO GROWTH

## 2019-11-24 LAB — BASIC METABOLIC PANEL
Anion gap: 10 (ref 5–15)
BUN: <5 mg/dL — ABNORMAL LOW (ref 8–23)
CO2: 21 mmol/L — ABNORMAL LOW (ref 22–32)
Calcium: 6.7 mg/dL — ABNORMAL LOW (ref 8.9–10.3)
Chloride: 99 mmol/L (ref 98–111)
Creatinine, Ser: 0.38 mg/dL — ABNORMAL LOW (ref 0.44–1.00)
GFR calc Af Amer: >60 mL/min (ref >60)
GFR calc non Af Amer: >60 mL/min (ref >60)
Glucose, Bld: 88 mg/dL (ref 70–99)
Potassium: 3.5 mmol/L (ref 3.5–5.1)
Sodium: 130 mmol/L — ABNORMAL LOW (ref 135–145)

## 2019-11-24 LAB — PROTIME-INR
INR: 1.1 (ref 0.8–1.2)
Prothrombin Time: 14.2 seconds (ref 11.4–15.2)

## 2019-11-24 MED ORDER — PIPERACILLIN-TAZOBACTAM 3.375 G IVPB
3.3750 g | Freq: Three times a day (TID) | INTRAVENOUS | Status: DC
  Administered 2019-11-24 – 2019-11-27 (×10): 3.375 g via INTRAVENOUS
  Filled 2019-11-24 (×8): qty 50

## 2019-11-24 NOTE — Progress Notes (Signed)
Pharmacy Antibiotic Note  Mike Deoni Cosey is a 69 y.o. female admitted on 11/23/2019 with diverticulitis with abscess.  Pharmacy has been consulted for Zosyn dosing.  ID: diverticulitis and intraabdominal abscess. 5/5 IR drain. 5/6: Surgery notes: Patient reports episode of purulent material during urination.  Suspicion for colovesical fistula  Zosyn 5/6>> Flagyl 5/5>>  5/5: Abscess: GPC, GPR 5/5: BC x 2 5/5: UCs: neg  Plan: D/c Flagyl Zosyn 3.375g IV every 8 hours. No renal adjustment for this pt. Pharmacy will sign off. Please reconsult for further dosing assitance.    Height: 5\' 2"  (157.5 cm) Weight: 55 kg (121 lb 4.1 oz) IBW/kg (Calculated) : 50.1  Temp (24hrs), Avg:97.8 F (36.6 C), Min:97.7 F (36.5 C), Max:98 F (36.7 C)  Recent Labs  Lab 11/23/19 0511 11/23/19 1356 11/24/19 0219  WBC 23.2*  --  11.0*  CREATININE 0.54 0.44 0.38*    Estimated Creatinine Clearance: 54 mL/min (A) (by C-G formula based on SCr of 0.38 mg/dL (L)).    Allergies  Allergen Reactions  . Sulfa Antibiotics Anaphylaxis and Rash  . Sulfonamide Derivatives Anaphylaxis and Rash  . Dilaudid [Hydromorphone Hcl] Hives  . Penicillins Other (See Comments)    Has patient had a PCN reaction causing immediate rash, facial/tongue/throat swelling, SOB or lightheadedness with hypotension: No Has patient had a PCN reaction causing severe rash involving mucus membranes or skin necrosis: No Has patient had a PCN reaction that required hospitalization No Has patient had a PCN reaction occurring within the last 10 years: No If all of the above answers are "NO", then may proceed with Cephalosporin use.  01/24/20 Hydromorphone Hcl Rash   Joana Nolton S. Marland Kitchen, PharmD, BCPS Clinical Staff Pharmacist [email or web address] Merilynn Finland 11/24/2019 9:29 AM

## 2019-11-24 NOTE — Progress Notes (Signed)
Referring Physician(s): Darnelle Spangle  Supervising Physician: Malachy Moan  Patient Status:  [name] - In-pt  Chief Complaint: Diverticular abscess   Subjective: Pt resting quietly in room; denies worsening abd pain,N/V; conversant   Allergies: Sulfa antibiotics, Sulfonamide derivatives, Dilaudid [hydromorphone hcl], Penicillins, and Hydromorphone hcl  Medications: Prior to Admission medications   Medication Sig Start Date End Date Taking? Authorizing Provider  amLODipine (NORVASC) 10 MG tablet TAKE 1 TABLET BY MOUTH EVERY DAY Patient taking differently: Take 10 mg by mouth daily.  10/28/19  Yes Philip Aspen, Limmie Patricia, MD  aspirin 81 MG chewable tablet Chew 1 tablet (81 mg total) by mouth daily. 04/15/19  Yes Ghimire, Werner Lean, MD  denosumab (PROLIA) 60 MG/ML SOSY injection Inject 60 mg into the skin every 6 (six) months.   Yes [provider]  folic acid (FOLVITE) 1 MG tablet Take 1 tablet (1 mg total) by mouth daily. 05/03/19  Yes Philip Aspen, Limmie Patricia, MD  gabapentin (NEURONTIN) 100 MG capsule TAKE 1 CAPSULE BY MOUTH TWICE A DAY Patient taking differently: Take 100 mg by mouth 2 (two) times daily.  10/25/19  Yes Philip Aspen, Limmie Patricia, MD  Multiple Vitamin (MULTIVITAMIN WITH MINERALS) TABS tablet Take 1 tablet by mouth daily.   Yes [provider]  pantoprazole (PROTONIX) 40 MG tablet TAKE 1 TABLET BY MOUTH EVERY DAY Patient taking differently: Take 40 mg by mouth daily.  10/28/19  Yes Philip Aspen, Limmie Patricia, MD  thiamine 100 MG tablet Take 1 tablet (100 mg total) by mouth daily. Patient not taking: Reported on 11/23/2019 06/10/19   Philip Aspen, Limmie Patricia, MD     Vital Signs: BP 131/63 (BP Location: Left Arm)   Pulse 70   Temp 97.7 F (36.5 C) (Oral)   Resp 14   Ht 5\' 2"  (1.575 m)   Wt 121 lb 4.1 oz (55 kg)   SpO2 97%   BMI 22.18 kg/m   Physical Exam awake, answering simple questions okay, left lower quadrant drain intact, not  significantly tender to palpation, output about 10 to 15 cc of purulent blood-tinged fluid.  Drain flushed without difficulty.  Imaging: CT ABDOMEN PELVIS W CONTRAST  Result Date: 11/23/2019 CLINICAL DATA:  Frequent urination, poor appetite EXAM: CT ABDOMEN AND PELVIS WITH CONTRAST TECHNIQUE: Multidetector CT imaging of the abdomen and pelvis was performed using the standard protocol following bolus administration of intravenous contrast. CONTRAST:  01/23/2020 OMNIPAQUE IOHEXOL 300 MG/ML  SOLN COMPARISON:  04/12/2019 FINDINGS: Lower chest: No acute abnormality. Hepatobiliary: Unchanged 1.0 cm low-density lesion at the right hepatic dome, likely cyst or hemangioma. No new focal liver lesions. There may be a trace amount of layering sludge or small stones within the gallbladder lumen. No inflammatory changes are seen. No biliary dilatation. Pancreas: Unremarkable. No pancreatic ductal dilatation or surrounding inflammatory changes. Spleen: Normal in size without focal abnormality. Adrenals/Urinary Tract: Unremarkable adrenal glands. Unchanged round low-density subcentimeter focus in the left kidney, too small to definitively characterize, but most likely represents cyst. Kidneys are otherwise unremarkable. No renal stone or hydronephrosis. There is thickening of the left wall of the urinary bladder adjacent to inflammatory change in the left hemipelvis. No intraluminal air within the bladder. Stomach/Bowel: Extensive sigmoid diverticulosis with long segment wall thickening, pericolonic inflammatory changes and free fluid. Large, complex multiloculated diverticular abscess adjacent to the sigmoid colon. Collection is difficult to accurately measure as it insinuates along the course of the inflamed sigmoid colon. The more inferior aspect of the collection  is located within the posterior cul-de-sac and extends along the course of the inflamed sigmoid colon and abuts the left lateral aspect of the urinary bladder measuring  up to 14 cm in maximal dimension (series 7, image 99; series 3, images 58-73). No bowel obstruction. 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 Vascular/Lymphatic: Aortoiliac atherosclerotic calcification without aneurysm. No lymphadenopathy. Reproductive: Loss of fat plane between the uterus and diverticular collection. Other: No large volume of free air. Musculoskeletal: No acute osseous findings. IMPRESSION: 1. Extensive sigmoid diverticulitis. Large, complex multiloculated diverticular abscess adjacent to the sigmoid colon measuring up to 14 cm in maximal dimension. 2. Thickening of the left wall of the urinary bladder adjacent to the inflammatory change in the left hemipelvis. No intraluminal air within the bladder to suggest fistula. These results were called by telephone at the time of interpretation on 11/23/2019 at 8:18 am to provider Dr Langston Masker, who verbally acknowledged these results. Electronically Signed   By: Davina Poke D.O.   On: 11/23/2019 08:21   CT IMAGE GUIDED FLUID DRAIN BY CATHETER  Result Date: 11/24/2019 CLINICAL DATA:  Diverticular abscess EXAM: CT GUIDED DRAINAGE OF PELVIC ABSCESS ANESTHESIA/SEDATION: Intravenous Fentanyl 118mcg and Versed 2mg  were administered as conscious sedation during continuous monitoring of the patient's level of consciousness and physiological / cardiorespiratory status by the radiology RN, with a total moderate sedation time of 15 minutes. PROCEDURE: The procedure, risks, benefits, and alternatives were explained to the patient. Questions regarding the procedure were encouraged and answered. The patient understands and consents to the procedure. Select axial scans through the lower abdomen and pelvis were obtained. The left pelvic collection was localized and an appropriate skin entry site was determined and marked. The operative field was prepped with chlorhexidinein a sterile fashion, and a sterile drape was applied covering the  operative field. A sterile gown and sterile gloves were used for the procedure. Local anesthesia was provided with 1% Lidocaine. Under CT fluoroscopic guidance, percutaneous entry needle advanced into the collection. Position confirmed on CT. Guidewire advanced easily, confirmed on CT. Tract dilated to facilitate placement 12 French pigtail drain catheter, placed within the dependent aspect of the collection. Approximately 50 mL of thin purulent material were aspirated, sent for Gram stain and culture. Catheter secured externally with 0 Prolene suture and StatLock and placed to gravity drain bag. The patient tolerated the procedure well. COMPLICATIONS: None immediate FINDINGS: Left lower quadrant /pelvic gas and fluid collection was again localized. 12 French pigtail drain catheter placed as above. Sample of the thin purulent aspirate sent for Gram stain and culture. IMPRESSION: Technically successful CT-guided left pelvic abscess drain catheter placement. Electronically Signed   By: Lucrezia Europe M.D.   On: 11/24/2019 09:41    Labs:  CBC: Recent Labs    04/07/19 1139 04/11/19 0428 06/03/19 0749 11/23/19 0511 11/24/19 0219  WBC  --  8.2 7.5 23.2* 11.0*  HGB  --  12.0 13.6 13.3 12.3  HCT   < > 34.2* 39.4  40.0 38.7 36.9  PLT  --  387 279.0 436* 347   < > = values in this interval not displayed.    COAGS: Recent Labs    04/06/19 0404 11/24/19 0219  INR 1.2 1.1  APTT 40*  --     BMP: Recent Labs    04/14/19 0321 04/14/19 0321 06/03/19 0749 11/23/19 0511 11/23/19 1356 11/24/19 0219  NA 129*   < > 135 121* 131* 130*  K 4.5   < >  5.1 4.4 3.7 3.5  CL 91*   < > 95* 88* 96* 99  CO2 28   < > 31 21* 23 21*  GLUCOSE 106*   < > 109* 125* 102* 88  BUN 12   < > 14 <5* <5* <5*  CALCIUM 8.6*   < > 9.5 6.9* 6.3* 6.7*  CREATININE 0.45   < > 0.58 0.54 0.44 0.38*  GFRNONAA >60  --   --  >60 >60 >60  GFRAA >60  --   --  >60 >60 >60   < > = values in this interval not displayed.    LIVER  FUNCTION TESTS: Recent Labs    04/11/19 0428 04/14/19 0321 06/03/19 0749 11/23/19 0511  BILITOT <0.1* 0.4 0.6 0.9  AST 37 25 21 24   ALT 22 21 28 22   ALKPHOS 64 73 99 150*  PROT 5.2* 5.4* 6.7 6.6  ALBUMIN 2.6* 2.8* 4.4 3.1*    Assessment and Plan: Patient with history of sigmoid diverticulitis with associated abscess, status post percutaneous drainage on 5/5.  Afebrile, WBC 11 down from 23, hemoglobin stable, creatinine 0.38, drain fluid cultures pending; continue drain irrigation, output monitoring; once output less than 10 to 15 cc/day for 2-3 consecutive days obtain follow-up imaging; will likely need drain injection prior to removal.  Other plans as per [facility]    Electronically Signed: D. , PA-C 11/24/2019, 10:04 AM   I spent a total of 15 minutes at the the patient's bedside AND on the patient's hospital floor or unit, greater than 50% of which was counseling/coordinating care for left lower abdominal abscess drain    Patient ID: [name], female   DOB: 1950/12/24, 69 y.o.   MRN: [id number]

## 2019-11-24 NOTE — Progress Notes (Signed)
Subjective: CC: Patient seems somewhat confused this morning but answers A&O questions x 3 at baseline (when compared to yesterdays conversation). She denies abdominal pain, n/v.  She denies flatus or BM.  She did have an episode while urinating where she had purulent material and urine.  This has now resolved. S/p IR drainage yesterday.   Objective: Vital signs in last 24 hours: Temp:  [97.7 F (36.5 C)-98 F (36.7 C)] 97.7 F (36.5 C) (05/06 0431) Pulse Rate:  [62-75] 70 (05/06 0431) Resp:  [12-20] 14 (05/06 0431) BP: (116-158)/(52-73) 131/63 (05/06 0431) SpO2:  [95 %-100 %] 97 % (05/06 0431) Last BM Date: 11/23/19  Intake/Output from previous day: 05/05 0701 - 05/06 0700 In: 1657.6 [I.V.:1250.8; IV Piggyback:406.8] Out: 10 [Drains:10] Intake/Output this shift: No intake/output data recorded.  PE: Gen:  Alert, NAD, pleasant Pulm: Normal rate and effort  Abd: Soft, NT/ND, +BS. IR drain with bloody, purulent material in bag.  Ext:  No erythema, edema, or tenderness BUE/BLE  Psych: A&Ox3 (place, self, situation) with an appropriate affect  Skin: no rashes noted, warm and dry   Lab Results:  Recent Labs    11/23/19 0511 11/24/19 0219  WBC 23.2* 11.0*  HGB 13.3 12.3  HCT 38.7 36.9  PLT 436* 347   BMET Recent Labs    11/23/19 1356 11/24/19 0219  NA 131* 130*  K 3.7 3.5  CL 96* 99  CO2 23 21*  GLUCOSE 102* 88  BUN <5* <5*  CREATININE 0.44 0.38*  CALCIUM 6.3* 6.7*   PT/INR Recent Labs    11/24/19 0219  LABPROT 14.2  INR 1.1   CMP     Component Value Date/Time   NA 130 (L) 11/24/2019 0219   K 3.5 11/24/2019 0219   CL 99 11/24/2019 0219   CO2 21 (L) 11/24/2019 0219   GLUCOSE 88 11/24/2019 0219   BUN <5 (L) 11/24/2019 0219   CREATININE 0.38 (L) 11/24/2019 0219   CALCIUM 6.7 (L) 11/24/2019 0219   PROT 6.6 11/23/2019 0511   ALBUMIN 3.1 (L) 11/23/2019 0511   AST 24 11/23/2019 0511   ALT 22 11/23/2019 0511   ALKPHOS 150 (H) 11/23/2019 0511   BILITOT 0.9 11/23/2019 0511   GFRNONAA >60 11/24/2019 0219   GFRAA >60 11/24/2019 0219   Lipase     Component Value Date/Time   LIPASE 16 11/23/2019 0655       Studies/Results: CT ABDOMEN PELVIS W CONTRAST  Result Date: 11/23/2019 CLINICAL DATA:  Frequent urination, poor appetite EXAM: CT ABDOMEN AND PELVIS WITH CONTRAST TECHNIQUE: Multidetector CT imaging of the abdomen and pelvis was performed using the standard protocol following bolus administration of intravenous contrast. CONTRAST:  137mL OMNIPAQUE IOHEXOL 300 MG/ML  SOLN COMPARISON:  04/12/2019 FINDINGS: Lower chest: No acute abnormality. Hepatobiliary: Unchanged 1.0 cm low-density lesion at the right hepatic dome, likely cyst or hemangioma. No new focal liver lesions. There may be a trace amount of layering sludge or small stones within the gallbladder lumen. No inflammatory changes are seen. No biliary dilatation. Pancreas: Unremarkable. No pancreatic ductal dilatation or surrounding inflammatory changes. Spleen: Normal in size without focal abnormality. Adrenals/Urinary Tract: Unremarkable adrenal glands. Unchanged round low-density subcentimeter focus in the left kidney, too small to definitively characterize, but most likely represents cyst. Kidneys are otherwise unremarkable. No renal stone or hydronephrosis. There is thickening of the left wall of the urinary bladder adjacent to inflammatory change in the left hemipelvis. No intraluminal air within the bladder.  Stomach/Bowel: Extensive sigmoid diverticulosis with long segment wall thickening, pericolonic inflammatory changes and free fluid. Large, complex multiloculated diverticular abscess adjacent to the sigmoid colon. Collection is difficult to accurately measure as it insinuates along the course of the inflamed sigmoid colon. The more inferior aspect of the collection is located within the posterior cul-de-sac and extends along the course of the inflamed sigmoid colon and abuts the  left lateral aspect of the urinary bladder measuring up to 14 cm in maximal dimension (series 7, image 99; series 3, images 58-73). No bowel obstruction. 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 Vascular/Lymphatic: Aortoiliac atherosclerotic calcification without aneurysm. No lymphadenopathy. Reproductive: Loss of fat plane between the uterus and diverticular collection. Other: No large volume of free air. Musculoskeletal: No acute osseous findings. IMPRESSION: 1. Extensive sigmoid diverticulitis. Large, complex multiloculated diverticular abscess adjacent to the sigmoid colon measuring up to 14 cm in maximal dimension. 2. Thickening of the left wall of the urinary bladder adjacent to the inflammatory change in the left hemipelvis. No intraluminal air within the bladder to suggest fistula. These results were called by telephone at the time of interpretation on 11/23/2019 at 8:18 am to provider Dr Renaye Rakers, who verbally acknowledged these results. Electronically Signed   By: Duanne Guess D.O.   On: 11/23/2019 08:21    Anti-infectives: Anti-infectives (From admission, onward)   Start     Dose/Rate Route Frequency Ordered Stop   11/24/19 1000  cefTRIAXone (ROCEPHIN) 2 g in sodium chloride 0.9 % 100 mL IVPB     2 g 200 mL/hr over 30 Minutes Intravenous Every 24 hours 11/23/19 0858     11/23/19 0915  cefTRIAXone (ROCEPHIN) 1 g in sodium chloride 0.9 % 100 mL IVPB     1 g 200 mL/hr over 30 Minutes Intravenous  Once 11/23/19 0903 11/23/19 1108   11/23/19 0900  metroNIDAZOLE (FLAGYL) IVPB 500 mg     500 mg 100 mL/hr over 60 Minutes Intravenous Every 8 hours 11/23/19 0858     11/23/19 0900  cefTRIAXone (ROCEPHIN) 1 g in sodium chloride 0.9 % 100 mL IVPB  Status:  Discontinued     1 g 200 mL/hr over 30 Minutes Intravenous  Once 11/23/19 0858 11/23/19 0902   11/23/19 0845  piperacillin-tazobactam (ZOSYN) IVPB 3.375 g  Status:  Discontinued     3.375 g 12.5 mL/hr over 240 Minutes  Intravenous Every 8 hours 11/23/19 0837 11/23/19 0857   11/23/19 0645  cefTRIAXone (ROCEPHIN) 1 g in sodium chloride 0.9 % 100 mL IVPB     1 g 200 mL/hr over 30 Minutes Intravenous  Once 11/23/19 8588 11/23/19 0736       Assessment/Plan 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 Prolonged QT UTI Hyponatremia - Per [facility] -    Recurrent Sigmoid diverticulitis with abscess measuring ~14cm - s/p IR drain 5/5 with 57ml of purulent drainage.  - Patient reports episode of purulent material during urination.  Suspicion for colovesical fistula - WBC improved.  She is no longer tender on palpation.  Will give sips from the floor - No indication for emergency surgery at this time. We will follow along. We discussed that if she does not improve with above conservative therapies, that she may require an ex lap with colectomy and colostomy during hospitalization. If she does improve with conservative therapies, will need to follow up with one of our colorectal surgeons after she gets over this bout of diverticulitis.  FEN - sips of clears, IVF VTE - SCDs, Lovenox ID - Rocephin/Flagyl (5/5 >>). Asking [facility] if they are okay with [place] switching to Zosyn. WBC from 23.2 to 11.0   LOS: 1 day    Jacinto Halim , [facility] 11/24/2019, 9:09 AM Please see Amion for pager number during day hours 7:00am-4:30pm

## 2019-11-24 NOTE — Progress Notes (Signed)
Received pt alert to person and place. Pt could be confused and forgetful. Oriented pt to room and use of call light. Will monitor pt.

## 2019-11-24 NOTE — Progress Notes (Signed)
PROGRESS NOTE    [name]  EXH:[id number] DOB: 09/05/1950 DOA: 11/23/2019 PCP: Philip Aspen, Limmie Patricia, MD    Brief Narrative:  69 y.o. female with medical history significant of hypertension, hyponatremia, iron deficiency anemia, alcohol abuse, duodenal ulcer, diverticular abscess, and GI bleed presented with poor appetite.  History is obtained from patient's husband who is present at bedside as she has some cognitive issues at baseline due to previous history of alcohol abuse.  He notes that she has not drank alcohol in over 1 year.  Over the last week she has had a decrease in her appetite and has not stopped eating.  Associated symptoms include weight loss, insomnia, and pus coming out of her vagina.  Patient appeared to be more confused and would ruminate on certain things with repetitive questions.  He notes that she had similar symptoms when she had a sigmoid diverticular abscess 03/2019.  Patient has not complained of any pain although was noted to be tender to palpation.  Pt presented found to have sigmoid diverticulitis with 14cm abscess  Assessment & Plan:   Principal Problem:   Abscess of sigmoid colon due to diverticulitis Active Problems:   Hyponatremia   Hypocalcemia   Long QT interval   Urinary tract infection   Metabolic encephalopathy  Sigmoid diverticulitis with abscess sepsis present on admit: Patient presented with complaints of decreased appetite and increased confusion and WBC of 23k  CT imaging significant for sigmoid diverticulitis with 14 cm abscess.   -General surgery consulted and is following -IR was consulted and pt now s/p drain placement -blood cx neg thus far -Was on rocephin. Discussed with General Surgery who recommends zosyn, changed abx to zosyn -WBC improved to 11k this AM=  Urinary tract infection: Acute.  Urinalysis positive for large leukocytes, few bacteria, 11-20 RBCs, moderate hemoglobin, and greater than 50 WBCs. -Urine cx  pending -Continue empiric antibiotics as seen above  Leukocytosis: Acute.  WBC elevated at 23.2 with signs of left shift at time of presentation. -WBC improved to 11k overnight with abx  Hyponatremia: Acute on chronic.  Sodium 121 on admission, but baseline appears to run in the 127-130s.  Given recent decreased appetite suspect a hypovolemic hyponatremia as the cause. -Pt had been continued on gentle IVF hydration -Repeat bmet in AM  Hypocalcemia: Acute.  Calcium 6.9 and was 7.6 when corrected for albumin. -Pt was given 2 g of calcium gluconate IV -Continue to monitor and replace as needed  Acute toxic metabolic encephalopathy: On chronic.  Patient noted to have some cognitive deficits due to previous history of alcohol abuse.  However, husband reportedly observed that patient was more confused than normal prior to admit  Suspect infection as likely cause of symptoms. -Continue to monitor. Pt seems to converse appropriately this AM  History of alcohol abuse: Patient has not drank alcohol in over 1 year.  Prolonged QT interval: Acute.  QTC noted to be 512 on admission. -Avoid QT prolonging medication -Correct electrolyte abnormalities as needed   DVT prophylaxis: Lovenox subq Code Status: Full Family Communication: Pt in room, family not at bedside  Status is: Inpatient  Remains inpatient appropriate because:IV treatments appropriate due to intensity of illness or inability to take PO and Inpatient level of care appropriate due to severity of illness   Dispo: The patient is from: Home              Anticipated d/c is to: Home  Anticipated d/c date is: > 3 days              Patient currently is not medically stable to d/c.  Consultants:   General Surgery  IR  Procedures:   Drain placement by IR 5/5  Antimicrobials: Anti-infectives (From admission, onward)   Start     Dose/Rate Route Frequency Ordered Stop   11/24/19 1030  piperacillin-tazobactam  (ZOSYN) IVPB 3.375 g     3.375 g 12.5 mL/hr over 240 Minutes Intravenous Every 8 hours 11/24/19 0935     11/24/19 1000  cefTRIAXone (ROCEPHIN) 2 g in sodium chloride 0.9 % 100 mL IVPB  Status:  Discontinued     2 g 200 mL/hr over 30 Minutes Intravenous Every 24 hours 11/23/19 0858 11/24/19 0917   11/23/19 0915  cefTRIAXone (ROCEPHIN) 1 g in sodium chloride 0.9 % 100 mL IVPB     1 g 200 mL/hr over 30 Minutes Intravenous  Once 11/23/19 0903 11/23/19 1108   11/23/19 0900  metroNIDAZOLE (FLAGYL) IVPB 500 mg  Status:  Discontinued     500 mg 100 mL/hr over 60 Minutes Intravenous Every 8 hours 11/23/19 0858 11/24/19 0934   11/23/19 0900  cefTRIAXone (ROCEPHIN) 1 g in sodium chloride 0.9 % 100 mL IVPB  Status:  Discontinued     1 g 200 mL/hr over 30 Minutes Intravenous  Once 11/23/19 0858 11/23/19 0902   11/23/19 0845  piperacillin-tazobactam (ZOSYN) IVPB 3.375 g  Status:  Discontinued     3.375 g 12.5 mL/hr over 240 Minutes Intravenous Every 8 hours 11/23/19 0837 11/23/19 0857   11/23/19 0645  cefTRIAXone (ROCEPHIN) 1 g in sodium chloride 0.9 % 100 mL IVPB     1 g 200 mL/hr over 30 Minutes Intravenous  Once 11/23/19 0635 11/23/19 0736       Subjective: Reports feeling somewhat better this AM. Seen at bedside with IR  Objective: Vitals:   11/23/19 2151 11/24/19 0431 11/24/19 1017 11/24/19 1348  BP: (!) 152/68 131/63 140/64 (!) 119/56  Pulse: 73 70 67 68  Resp: Temp: 98 F (36.7 C) 97.7 F (36.5 C) 97.9 F (36.6 C) 98.5 F (36.9 C)  TempSrc: Oral Oral Oral Oral  SpO2: 97% 97% 99% 100%  Weight:      Height:        Intake/Output Summary (Last 24 hours) at 11/24/2019 1524 Last data filed at 11/24/2019 0600 Gross per 24 hour  Intake 1557.55 ml  Output 10 ml  Net 1547.55 ml   Filed Weights   11/23/19 0454  Weight: 55 kg    Examination:  General exam: Appears calm and comfortable  Respiratory system: Clear to auscultation. Respiratory effort  normal. Cardiovascular system: S1 & S2 heard, Regular Gastrointestinal system: Abdomen is nondistended, pos BS, drain in place Central nervous system: Alert and oriented. No focal neurological deficits. Extremities: Symmetric 5 x 5 power. Skin: No rashes, lesions Psychiatry: Judgement and insight appear normal. Mood & affect appropriate.   Data Reviewed: I have personally reviewed following labs and imaging studies  CBC: Recent Labs  Lab 11/23/19 0511 11/24/19 0219  WBC 23.2* 11.0*  NEUTROABS 20.1*  --   HGB 13.3 12.3  HCT 38.7 36.9  MCV 91.9 93.4  PLT 436* 347   Basic Metabolic Panel: Recent Labs  Lab 11/23/19 0511 11/23/19 1356 11/24/19 0219  NA 121* 131* 130*  K 4.4 3.7 3.5  CL 88* 96* 99  CO2 21* 23 21*  GLUCOSE  125* 102* 88  BUN <5* <5* <5*  CREATININE 0.54 0.44 0.38*  CALCIUM 6.9* 6.3* 6.7*   GFR: Estimated Creatinine Clearance: 54 mL/min (A) (by C-G formula based on SCr of 0.38 mg/dL (L)). Liver Function Tests: Recent Labs  Lab 11/23/19 0511  AST 24  ALT 22  ALKPHOS 150*  BILITOT 0.9  PROT 6.6  ALBUMIN 3.1*   Recent Labs  Lab 11/23/19 0655  LIPASE 16   No results for input(s): AMMONIA in the last 168 hours. Coagulation Profile: Recent Labs  Lab 11/24/19 0219  INR 1.1   Cardiac Enzymes: No results for input(s): CKTOTAL, CKMB, CKMBINDEX, TROPONINI in the last 168 hours. BNP (last 3 results) No results for input(s): PROBNP in the last 8760 hours. HbA1C: No results for input(s): HGBA1C in the last 72 hours. CBG: No results for input(s): GLUCAP in the last 168 hours. Lipid Profile: No results for input(s): CHOL, HDL, LDLCALC, TRIG, CHOLHDL, LDLDIRECT in the last 72 hours. Thyroid Function Tests: No results for input(s): TSH, T4TOTAL, FREET4, T3FREE, THYROIDAB in the last 72 hours. Anemia Panel: No results for input(s): VITAMINB12, FOLATE, FERRITIN, TIBC, IRON, RETICCTPCT in the last 72 hours. Sepsis Labs: No results for input(s):  PROCALCITON, LATICACIDVEN in the last 168 hours.  Recent Results (from the past 240 hour(s))  Respiratory Panel by RT PCR (Flu A&B, Covid) - Nasopharyngeal Swab     Status: None   Collection Time: 11/23/19  6:34 AM   Specimen: Nasopharyngeal Swab  Result Value Ref Range Status   SARS Coronavirus 2 by RT PCR NEGATIVE NEGATIVE Final    Comment: (NOTE) SARS-CoV-2 target nucleic acids are NOT DETECTED. The SARS-CoV-2 RNA is generally detectable in upper respiratoy specimens during the acute phase of infection. The lowest concentration of SARS-CoV-2 viral copies this assay can detect is 131 copies/mL. A negative result does not preclude SARS-Cov-2 infection and should not be used as the sole basis for treatment or other patient management decisions. A negative result may occur with  improper specimen collection/handling, submission of specimen other than nasopharyngeal swab, presence of viral mutation(s) within the areas targeted by this assay, and inadequate number of viral copies (<131 copies/mL). A negative result must be combined with clinical observations, patient history, and epidemiological information. The expected result is Negative. Fact Sheet for Patients:  https://www.moore.com/https://www.fda.gov/media/142436/download Fact Sheet for Healthcare Providers:  https://www.young.biz/https://www.fda.gov/media/142435/download This test is not yet ap proved or cleared by the [place] FDA and  has been authorized for detection and/or diagnosis of SARS-CoV-2 by FDA under an Emergency Use Authorization (EUA). This EUA will remain  in effect (meaning this test can be used) for the duration of the COVID-19 declaration under Section 564(b)(1) of the Act, 21 U.S.C. section 360bbb-3(b)(1), unless the authorization is terminated or revoked sooner.    Influenza A by PCR NEGATIVE NEGATIVE Final   Influenza B by PCR NEGATIVE NEGATIVE Final    Comment: (NOTE) The Xpert Xpress SARS-CoV-2/FLU/RSV assay is intended as an aid in  the  diagnosis of influenza from Nasopharyngeal swab specimens and  should not be used as a sole basis for treatment. Nasal washings and  aspirates are unacceptable for Xpert Xpress SARS-CoV-2/FLU/RSV  testing. Fact Sheet for Patients: https://www.moore.com/https://www.fda.gov/media/142436/download Fact Sheet for Healthcare Providers: https://www.young.biz/https://www.fda.gov/media/142435/download This test is not yet approved or cleared by the [place] FDA and  has been authorized for detection and/or diagnosis of SARS-CoV-2 by  FDA under an Emergency Use Authorization (EUA). This EUA will remain  in effect (meaning this  test can be used) for the duration of the  Covid-19 declaration under Section 564(b)(1) of the Act, 21  U.S.C. section 360bbb-3(b)(1), unless the authorization is  terminated or revoked. Performed at [facility], [place] [address], [place], [place] [address]   Urine culture     Status: None   Collection Time: 11/23/19  7:36 AM   Specimen: Urine, Random  Result Value Ref Range Status   Specimen Description URINE, RANDOM  Final   Special Requests NONE  Final   Culture   Final    NO GROWTH Performed at [facility], [place] [address], [place], [place] [address]    Report Status 11/24/2019 FINAL  Final  Culture, blood (routine x 2)     Status: None (Preliminary result)   Collection Time: 11/23/19  9:36 AM   Specimen: BLOOD LEFT HAND  Result Value Ref Range Status   Specimen Description BLOOD LEFT HAND  Final   Special Requests   Final    AEROBIC BOTTLE ONLY Blood Culture results may not be optimal due to an inadequate volume of blood received in culture bottles   Culture   Final    NO GROWTH < 24 HOURS Performed at [facility], [place] [address], [place], [place] [address]    Report Status PENDING  Incomplete  Culture, blood (routine x 2)     Status: None (Preliminary result)   Collection Time: 11/23/19  9:36 AM   Specimen: BLOOD  Result Value Ref Range Status   Specimen Description BLOOD  RIGHT ANTECUBITAL  Final   Special Requests   Final    AEROBIC BOTTLE ONLY Blood Culture results may not be optimal due to an inadequate volume of blood received in culture bottles   Culture   Final    NO GROWTH < 24 HOURS Performed at [facility], [place] [address], [place], [place] [address]    Report Status PENDING  Incomplete  Aerobic/Anaerobic Culture (surgical/deep wound)     Status: None (Preliminary result)   Collection Time: 11/23/19  6:09 PM   Specimen: Abscess  Result Value Ref Range Status   Specimen Description ABSCESS  Final   Special Requests NONE  Final   Gram Stain   Final    ABUNDANT WBC PRESENT, PREDOMINANTLY PMN MODERATE GRAM POSITIVE COCCI FEW GRAM POSITIVE RODS    Culture   Final    CULTURE REINCUBATED FOR BETTER GROWTH Performed at [facility], [place] [address] [place], [place] [address]    Report Status PENDING  Incomplete     Radiology Studies: CT ABDOMEN PELVIS W CONTRAST  Result Date: 11/23/2019 CLINICAL DATA:  Frequent urination, poor appetite EXAM: CT ABDOMEN AND PELVIS WITH CONTRAST TECHNIQUE: Multidetector CT imaging of the abdomen and pelvis was performed using the standard protocol following bolus administration of intravenous contrast. CONTRAST:  146mL OMNIPAQUE IOHEXOL 300 MG/ML  SOLN COMPARISON:  04/12/2019 FINDINGS: Lower chest: No acute abnormality. Hepatobiliary: Unchanged 1.0 cm low-density lesion at the right hepatic dome, likely cyst or hemangioma. No new focal liver lesions. There may be a trace amount of layering sludge or small stones within the gallbladder lumen. No inflammatory changes are seen. No biliary dilatation. Pancreas: Unremarkable. No pancreatic ductal dilatation or surrounding inflammatory changes. Spleen: Normal in size without focal abnormality. Adrenals/Urinary Tract: Unremarkable adrenal glands. Unchanged round low-density subcentimeter focus in the left kidney, too small to definitively characterize, but most likely  represents cyst. Kidneys are otherwise unremarkable.  No renal stone or hydronephrosis. There is thickening of the left wall of the urinary bladder adjacent to inflammatory change in the left hemipelvis. No intraluminal air within the bladder. Stomach/Bowel: Extensive sigmoid diverticulosis with long segment wall thickening, pericolonic inflammatory changes and free fluid. Large, complex multiloculated diverticular abscess adjacent to the sigmoid colon. Collection is difficult to accurately measure as it insinuates along the course of the inflamed sigmoid colon. The more inferior aspect of the collection is located within the posterior cul-de-sac and extends along the course of the inflamed sigmoid colon and abuts the left lateral aspect of the urinary bladder measuring up to 14 cm in maximal dimension (series 7, image 99; series 3, images 58-73). No bowel obstruction. 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 Vascular/Lymphatic: Aortoiliac atherosclerotic calcification without aneurysm. No lymphadenopathy. Reproductive: Loss of fat plane between the uterus and diverticular collection. Other: No large volume of free air. Musculoskeletal: No acute osseous findings. IMPRESSION: 1. Extensive sigmoid diverticulitis. Large, complex multiloculated diverticular abscess adjacent to the sigmoid colon measuring up to 14 cm in maximal dimension. 2. Thickening of the left wall of the urinary bladder adjacent to the inflammatory change in the left hemipelvis. No intraluminal air within the bladder to suggest fistula. These results were called by telephone at the time of interpretation on 11/23/2019 at 8:18 am to provider Dr Renaye Rakers, who verbally acknowledged these results. Electronically Signed   By: Duanne Guess D.O.   On: 11/23/2019 08:21   CT IMAGE GUIDED FLUID DRAIN BY CATHETER  Result Date: 11/24/2019 CLINICAL DATA:  Diverticular abscess EXAM: CT GUIDED DRAINAGE OF PELVIC ABSCESS  ANESTHESIA/SEDATION: Intravenous Fentanyl and Versed 2mg  were administered as conscious sedation during continuous monitoring of the patient's level of consciousness and physiological / cardiorespiratory status by the radiology RN, with a total moderate sedation time of 15 minutes. PROCEDURE: The procedure, risks, benefits, and alternatives were explained to the patient. Questions regarding the procedure were encouraged and answered. The patient understands and consents to the procedure. Select axial scans through the lower abdomen and pelvis were obtained. The left pelvic collection was localized and an appropriate skin entry site was determined and marked. The operative field was prepped with chlorhexidinein a sterile fashion, and a sterile drape was applied covering the operative field. A sterile gown and sterile gloves were used for the procedure. Local anesthesia was provided with 1% Lidocaine. Under CT fluoroscopic guidance, percutaneous entry needle advanced into the collection. Position confirmed on CT. Guidewire advanced easily, confirmed on CT. Tract dilated to facilitate placement 12 French pigtail drain catheter, placed within the dependent aspect of the collection. Approximately 50 mL of thin purulent material were aspirated, sent for Gram stain and culture. Catheter secured externally with 0 Prolene suture and StatLock and placed to gravity drain bag. The patient tolerated the procedure well. COMPLICATIONS: None immediate FINDINGS: Left lower quadrant /pelvic gas and fluid collection was again localized. 12 French pigtail drain catheter placed as above. Sample of the thin purulent aspirate sent for Gram stain and culture. IMPRESSION: Technically successful CT-guided left pelvic abscess drain catheter placement. Electronically Signed   By: M.D.   On: 11/24/2019 09:41    Scheduled Meds: . amLODipine  10 mg Oral Daily  . [START ON 11/25/2019] enoxaparin (LOVENOX) injection  40 mg  Subcutaneous Q24H  . gabapentin  100 mg Oral BID  . sodium chloride flush  3 mL Intravenous Q12H  . sodium chloride flush  5 mL Intracatheter Q8H  Continuous Infusions: . sodium chloride 75 mL/hr at 11/24/19 0600  . piperacillin-tazobactam (ZOSYN)  IV 3.375 g (11/24/19 0948)     LOS: 1 day   Rickey Barbara, MD [facility] Pager On Amion  If 7PM-7AM, please contact night-coverage 11/24/2019, 3:24 PM

## 2019-11-24 NOTE — Progress Notes (Signed)
Initial Nutrition Assessment  DOCUMENTATION CODES:   Not applicable  INTERVENTION:   -RD will follow for diet advancement and supplement as appropriate  NUTRITION DIAGNOSIS:   Inadequate oral intake related to altered GI function as evidenced by NPO status.  GOAL:   Patient will meet greater than or equal to 90% of their needs  MONITOR:   Diet advancement, Labs, Weight trends, Skin, I & O's  REASON FOR ASSESSMENT:   Malnutrition Screening Tool    ASSESSMENT:   [name] is a 69 y.o. female with medical history significant of hypertension, hyponatremia, iron deficiency anemia, alcohol abuse, duodenal ulcer, diverticular abscess, and GI bleed presented with poor appetite.  History is obtained from patient's husband who is present at bedside as she has some cognitive issues at baseline due to previous history of alcohol abuse.  He notes that she has not drank alcohol in over 1 year.  Over the last week she has had a decrease in her appetite and has not stopped eating.  Associated symptoms include weight loss, insomnia, and pus coming out of her vagina.  Patient appeared to be more confused and would ruminate on certain things with repetitive questions.  He notes that she had similar symptoms when she had a sigmoid diverticular abscess 03/2019.  Patient has not complained of any pain although was noted to be tender to palpation.  Pt admitted with abscess of sigmoid colon due to diverticulitis.   5/5- s/p CT guided RLQ abscess drain placement  Reviewed I/O's: +1.6 L x 24 hours  Drain output: 10 ml x 24 hours  Case discussed with RN and nurse tech; confirmed NPO status.   Pt lying in bed at time of visit. She has history of cognitive deficits. She was pleasant, but unable to participate in history. She continues to repeat "I am so sorry, but I am so sleepy" and "Please tell the doctor my husband's name is [name]".   Reviewed wt hx; noted gradual wt increase over the  past year, which is favorable given pt's prior history of malnutrition.   Medications reviewed and include 0.9% sodium chloride infusion @ 75 ml/hr.   Labs reviewed: Na: 130.   NUTRITION - FOCUSED PHYSICAL EXAM:    Most Recent Value  Orbital Region  No depletion  Upper Arm Region  Mild depletion  Thoracic and Lumbar Region  No depletion  Buccal Region  No depletion  Temple Region  No depletion  Clavicle Bone Region  No depletion  Clavicle and Acromion Bone Region  No depletion  Scapular Bone Region  No depletion  Dorsal Hand  Mild depletion  Patellar Region  No depletion  Anterior Thigh Region  No depletion  Posterior Calf Region  No depletion  Edema (RD Assessment)  None  Hair  Reviewed  Eyes  Reviewed  Mouth  Reviewed  Skin  Reviewed  Nails  Reviewed       Diet Order:   Diet Order            Diet NPO time specified Except for: Sips with Meds, Ice Chips, Other (See Comments)  Diet effective now              EDUCATION NEEDS:   Not appropriate for education at this time  Skin:  Skin Assessment: Reviewed RN Assessment  Last BM:  11/23/19  Height:   Ht Readings from Last 1 Encounters:  11/23/19 5\' 2"  (1.575 m)    Weight:   Wt Readings from  Last 1 Encounters:  11/23/19 55 kg    Ideal Body Weight:  50 kg  BMI:  Body mass index is 22.18 kg/m.  Estimated Nutritional Needs:   Kcal:  1700-1900  Protein:  85-100 grams  Fluid:  > 1.7 L    Levada Schilling, RD, LDN, CDCES Registered Dietitian II Certified Diabetes Care and Education Specialist Please refer to [facility] for RD and/or RD on-call/weekend/after hours pager

## 2019-11-25 LAB — CBC
HCT: 43.8 % (ref 36.0–46.0)
Hemoglobin: 14.5 g/dL (ref 12.0–15.0)
MCH: 30.9 pg (ref 26.0–34.0)
MCHC: 33.1 g/dL (ref 30.0–36.0)
MCV: 93.4 fL (ref 80.0–100.0)
Platelets: 398 10*3/uL (ref 150–400)
RBC: 4.69 MIL/uL (ref 3.87–5.11)
RDW: 13.5 % (ref 11.5–15.5)
WBC: 7.5 10*3/uL (ref 4.0–10.5)
nRBC: 0 % (ref 0.0–0.2)

## 2019-11-25 LAB — COMPREHENSIVE METABOLIC PANEL
ALT: 18 U/L (ref 0–44)
AST: 23 U/L (ref 15–41)
Albumin: 2.9 g/dL — ABNORMAL LOW (ref 3.5–5.0)
Alkaline Phosphatase: 89 U/L (ref 38–126)
Anion gap: 14 (ref 5–15)
BUN: <5 mg/dL — ABNORMAL LOW (ref 8–23)
CO2: 20 mmol/L — ABNORMAL LOW (ref 22–32)
Calcium: 6.6 mg/dL — ABNORMAL LOW (ref 8.9–10.3)
Chloride: 98 mmol/L (ref 98–111)
Creatinine, Ser: 0.44 mg/dL (ref 0.44–1.00)
GFR calc Af Amer: >60 mL/min (ref >60)
GFR calc non Af Amer: >60 mL/min (ref >60)
Glucose, Bld: 92 mg/dL (ref 70–99)
Potassium: 3.9 mmol/L (ref 3.5–5.1)
Sodium: 132 mmol/L — ABNORMAL LOW (ref 135–145)
Total Bilirubin: 1.3 mg/dL — ABNORMAL HIGH (ref 0.3–1.2)
Total Protein: 5.8 g/dL — ABNORMAL LOW (ref 6.5–8.1)

## 2019-11-25 NOTE — Progress Notes (Signed)
PROGRESS NOTE    [name]  JQZ:[id number] DOB: August 28, 1950 DOA: 11/23/2019 PCP: Philip Aspen, Limmie Patricia, MD    Brief Narrative:  69 y.o. female with medical history significant of hypertension, hyponatremia, iron deficiency anemia, alcohol abuse, duodenal ulcer, diverticular abscess, and GI bleed presented with poor appetite.  History is obtained from patient's husband who is present at bedside as she has some cognitive issues at baseline due to previous history of alcohol abuse.  He notes that she has not drank alcohol in over 1 year.  Over the last week she has had a decrease in her appetite and has not stopped eating.  Associated symptoms include weight loss, insomnia, and pus coming out of her vagina.  Patient appeared to be more confused and would ruminate on certain things with repetitive questions.  He notes that she had similar symptoms when she had a sigmoid diverticular abscess 03/2019.  Patient has not complained of any pain although was noted to be tender to palpation.  Pt presented found to have sigmoid diverticulitis with 14cm abscess  Assessment & Plan:   Principal Problem:   Abscess of sigmoid colon due to diverticulitis Active Problems:   Hyponatremia   Hypocalcemia   Long QT interval   Urinary tract infection   Metabolic encephalopathy  Sigmoid diverticulitis with abscess sepsis present on admit: Patient presented with complaints of decreased appetite and increased confusion and WBC of 23k  CT imaging significant for sigmoid diverticulitis with 14 cm abscess.   -General surgery consulted and is following -IR was consulted and pt now s/p drain placement -blood cx neg thus far -Currently on zosyn -WBC normalized -Discussed with General Surgery. Plan to advance diet per Surgery and if stable over weekend, possibility for d/c with drain in place  Urinary tract infection: Acute.  Urinalysis positive for large leukocytes, few bacteria, 11-20 RBCs, moderate  hemoglobin, and greater than 50 WBCs. -Urine cx pending -Continue empiric antibiotics as noted above  Leukocytosis: Acute.  WBC elevated at 23.2 with signs of left shift at time of presentation. -WBC has normalized  Hyponatremia: Acute on chronic.  Sodium 121 on admission, but baseline appears to run in the 127-130s.  Given recent decreased appetite suspect a hypovolemic hyponatremia as the cause. -Pt had been continued on gentle IVF hydration -Recheck bmet  Hypocalcemia: Acute.  Calcium 6.9 and was 7.6 when corrected for albumin. -Pt was given 2 g of calcium gluconate IV -Continue to monitor and replace as needed  Acute toxic metabolic encephalopathy: On chronic.  Patient noted to have some cognitive deficits due to previous history of alcohol abuse.  However, husband reportedly observed that patient was more confused than normal prior to admit  Suspect infection as likely cause of symptoms. -resolved. Pt conversing appropriately and ambulating in hallway with husband  History of alcohol abuse: Patient has not drank alcohol in over 1 year.  Prolonged QT interval: Acute.  QTC noted to be 512 on admission. -Avoid QT prolonging medication -Correct electrolyte abnormalities as needed   DVT prophylaxis: Lovenox subq Code Status: Full Family Communication: Pt in room, family at bedside  Status is: Inpatient  Remains inpatient appropriate because:IV treatments appropriate due to intensity of illness or inability to take PO and Inpatient level of care appropriate due to severity of illness   Dispo: The patient is from: Home              Anticipated d/c is to: Home  Anticipated d/c date is: 2 days              Patient currently is not medically stable to d/c.  Consultants:   General Surgery  IR  Procedures:   Drain placement by IR 5/5  Antimicrobials: Anti-infectives (From admission, onward)   Start     Dose/Rate Route Frequency Ordered Stop   11/24/19  1030  piperacillin-tazobactam (ZOSYN) IVPB 3.375 g     3.375 g 12.5 mL/hr over 240 Minutes Intravenous Every 8 hours 11/24/19 0935     11/24/19 1000  cefTRIAXone (ROCEPHIN) 2 g in sodium chloride 0.9 % 100 mL IVPB  Status:  Discontinued     2 g 200 mL/hr over 30 Minutes Intravenous Every 24 hours 11/23/19 0858 11/24/19 0917   11/23/19 0915  cefTRIAXone (ROCEPHIN) 1 g in sodium chloride 0.9 % 100 mL IVPB     1 g 200 mL/hr over 30 Minutes Intravenous  Once 11/23/19 0903 11/23/19 1108   11/23/19 0900  metroNIDAZOLE (FLAGYL) IVPB 500 mg  Status:  Discontinued     500 mg 100 mL/hr over 60 Minutes Intravenous Every 8 hours 11/23/19 0858 11/24/19 0934   11/23/19 0900  cefTRIAXone (ROCEPHIN) 1 g in sodium chloride 0.9 % 100 mL IVPB  Status:  Discontinued     1 g 200 mL/hr over 30 Minutes Intravenous  Once 11/23/19 0858 11/23/19 0902   11/23/19 0845  piperacillin-tazobactam (ZOSYN) IVPB 3.375 g  Status:  Discontinued     3.375 g 12.5 mL/hr over 240 Minutes Intravenous Every 8 hours 11/23/19 0837 11/23/19 0857   11/23/19 0645  cefTRIAXone (ROCEPHIN) 1 g in sodium chloride 0.9 % 100 mL IVPB     1 g 200 mL/hr over 30 Minutes Intravenous  Once 11/23/19 0635 11/23/19 0736      Subjective: Reports feeling better. Randel PiggKeen on going home soon  Objective: Vitals:   11/24/19 1348 11/24/19 2156 11/25/19 0253 11/25/19 1554  BP: (!) 119/56 (!) 163/67 (!) 174/71 (!) 143/75  Pulse: 68 64 (!) 59 69  Resp: 18 17 20 16   Temp: 98.5 F (36.9 C) 98.2 F (36.8 C) 97.6 F (36.4 C) 97.9 F (36.6 C)  TempSrc: Oral Oral Oral Oral  SpO2: 100% 98% 100% 99%  Weight:      Height:        Intake/Output Summary (Last 24 hours) at 11/25/2019 1722 Last data filed at 11/25/2019 1500 Gross per 24 hour  Intake 2116.98 ml  Output 10 ml  Net 2106.98 ml   Filed Weights   11/23/19 0454  Weight: 55 kg    Examination: General exam: Awake, laying in bed, in nad Respiratory system: Normal respiratory effort, no  wheezing Cardiovascular system: regular rate, s1, s2 Gastrointestinal system: Soft, nondistended, positive BS, drain in place Central nervous system: CN2-12 grossly intact, strength intact Extremities: Perfused, no clubbing Skin: Normal skin turgor, no notable skin lesions seen Psychiatry: Mood normal // no visual hallucinations   Data Reviewed: I have personally reviewed following labs and imaging studies  CBC: Recent Labs  Lab 11/23/19 0511 11/24/19 0219 11/25/19 0300  WBC 23.2* 11.0* 7.5  NEUTROABS 20.1*  --   --   HGB 13.3 12.3 14.5  HCT 38.7 36.9 43.8  MCV 91.9 93.4 93.4  PLT 436* 347 398   Basic Metabolic Panel: Recent Labs  Lab 11/23/19 0511 11/23/19 1356 11/24/19 0219 11/25/19 0300  NA 121* 131* 130* 132*  K 4.4 3.7 3.5 3.9  CL 88* 96* 99  98  CO2 21* 23 21* 20*  GLUCOSE 125* 102* 88 92  BUN <5* <5* <5* <5*  CREATININE 0.54 0.44 0.38* 0.44  CALCIUM 6.9* 6.3* 6.7* 6.6*   GFR: Estimated Creatinine Clearance: 54 mL/min (by C-G formula based on SCr of 0.44 mg/dL). Liver Function Tests: Recent Labs  Lab 11/23/19 0511 11/25/19 0300  AST 24 23  ALT 22 18  ALKPHOS 150* 89  BILITOT 0.9 1.3*  PROT 6.6 5.8*  ALBUMIN 3.1* 2.9*   Recent Labs  Lab 11/23/19 0655  LIPASE 16   No results for input(s): AMMONIA in the last 168 hours. Coagulation Profile: Recent Labs  Lab 11/24/19 0219  INR 1.1   Cardiac Enzymes: No results for input(s): CKTOTAL, CKMB, CKMBINDEX, TROPONINI in the last 168 hours. BNP (last 3 results) No results for input(s): PROBNP in the last 8760 hours. HbA1C: No results for input(s): HGBA1C in the last 72 hours. CBG: No results for input(s): GLUCAP in the last 168 hours. Lipid Profile: No results for input(s): CHOL, HDL, LDLCALC, TRIG, CHOLHDL, LDLDIRECT in the last 72 hours. Thyroid Function Tests: No results for input(s): TSH, T4TOTAL, FREET4, T3FREE, THYROIDAB in the last 72 hours. Anemia Panel: No results for input(s):  VITAMINB12, FOLATE, FERRITIN, TIBC, IRON, RETICCTPCT in the last 72 hours. Sepsis Labs: No results for input(s): PROCALCITON, LATICACIDVEN in the last 168 hours.  Recent Results (from the past 240 hour(s))  Respiratory Panel by RT PCR (Flu A&B, Covid) - Nasopharyngeal Swab     Status: None   Collection Time: 11/23/19  6:34 AM   Specimen: Nasopharyngeal Swab  Result Value Ref Range Status   SARS Coronavirus 2 by RT PCR NEGATIVE NEGATIVE Final    Comment: (NOTE) SARS-CoV-2 target nucleic acids are NOT DETECTED. The SARS-CoV-2 RNA is generally detectable in upper respiratoy specimens during the acute phase of infection. The lowest concentration of SARS-CoV-2 viral copies this assay can detect is 131 copies/mL. A negative result does not preclude SARS-Cov-2 infection and should not be used as the sole basis for treatment or other patient management decisions. A negative result may occur with  improper specimen collection/handling, submission of specimen other than nasopharyngeal swab, presence of viral mutation(s) within the areas targeted by this assay, and inadequate number of viral copies (<131 copies/mL). A negative result must be combined with clinical observations, patient history, and epidemiological information. The expected result is Negative. Fact Sheet for Patients:  PinkCheek.be Fact Sheet for Healthcare Providers:  GravelBags.it This test is not yet ap proved or cleared by the [place] FDA and  has been authorized for detection and/or diagnosis of SARS-CoV-2 by FDA under an Emergency Use Authorization (EUA). This EUA will remain  in effect (meaning this test can be used) for the duration of the COVID-19 declaration under Section 564(b)(1) of the Act, 21 U.S.C. section 360bbb-3(b)(1), unless the authorization is terminated or revoked sooner.    Influenza A by PCR NEGATIVE NEGATIVE Final   Influenza B by PCR  NEGATIVE NEGATIVE Final    Comment: (NOTE) The Xpert Xpress SARS-CoV-2/FLU/RSV assay is intended as an aid in  the diagnosis of influenza from Nasopharyngeal swab specimens and  should not be used as a sole basis for treatment. Nasal washings and  aspirates are unacceptable for Xpert Xpress SARS-CoV-2/FLU/RSV  testing. Fact Sheet for Patients: PinkCheek.be Fact Sheet for Healthcare Providers: GravelBags.it This test is not yet approved or cleared by the [place] FDA and  has been authorized for detection and/or diagnosis of  SARS-CoV-2 by  FDA under an Emergency Use Authorization (EUA). This EUA will remain  in effect (meaning this test can be used) for the duration of the  Covid-19 declaration under Section 564(b)(1) of the Act, 21  U.S.C. section 360bbb-3(b)(1), unless the authorization is  terminated or revoked. Performed at [facility], [address], [place], [place] [address]   Urine culture     Status: None   Collection Time: 11/23/19  7:36 AM   Specimen: Urine, Random  Result Value Ref Range Status   Specimen Description URINE, RANDOM  Final   Special Requests NONE  Final   Culture   Final    NO GROWTH Performed at [facility], [address], [place], [place] [address]    Report Status 11/24/2019 FINAL  Final  Culture, blood (routine x 2)     Status: None (Preliminary result)   Collection Time: 11/23/19  9:36 AM   Specimen: BLOOD LEFT HAND  Result Value Ref Range Status   Specimen Description BLOOD LEFT HAND  Final   Special Requests   Final    AEROBIC BOTTLE ONLY Blood Culture results may not be optimal due to an inadequate volume of blood received in culture bottles   Culture   Final    NO GROWTH 2 DAYS Performed at [facility], [address], [place], [place] [address]    Report Status PENDING  Incomplete  Culture, blood (routine x 2)     Status: None (Preliminary result)    Collection Time: 11/23/19  9:36 AM   Specimen: BLOOD  Result Value Ref Range Status   Specimen Description BLOOD RIGHT ANTECUBITAL  Final   Special Requests   Final    AEROBIC BOTTLE ONLY Blood Culture results may not be optimal due to an inadequate volume of blood received in culture bottles   Culture   Final    NO GROWTH 2 DAYS Performed at [facility], [address], [place], [place] [address]    Report Status PENDING  Incomplete  Aerobic/Anaerobic Culture (surgical/deep wound)     Status: None (Preliminary result)   Collection Time: 11/23/19  6:09 PM   Specimen: Abscess  Result Value Ref Range Status   Specimen Description ABSCESS  Final   Special Requests NONE  Final   Gram Stain   Final    ABUNDANT WBC PRESENT, PREDOMINANTLY PMN MODERATE GRAM POSITIVE COCCI FEW GRAM POSITIVE RODS    Culture   Final    RARE GRAM NEGATIVE RODS FEW PSEUDOMONAS AERUGINOSA NO ANAEROBES ISOLATED; CULTURE IN PROGRESS FOR 5 DAYS SUSCEPTIBILITIES TO FOLLOW Performed at [facility], [address], [place], [place] [address]    Report Status PENDING  Incomplete     Radiology Studies: CT IMAGE GUIDED FLUID DRAIN BY CATHETER  Result Date: 11/24/2019 CLINICAL DATA:  Diverticular abscess EXAM: CT GUIDED DRAINAGE OF PELVIC ABSCESS ANESTHESIA/SEDATION: Intravenous Fentanyl and Versed 2mg  were administered as conscious sedation during continuous monitoring of the patient's level of consciousness and physiological / cardiorespiratory status by the radiology RN, with a total moderate sedation time of 15 minutes. PROCEDURE: The procedure, risks, benefits, and alternatives were explained to the patient. Questions regarding the procedure were encouraged and answered. The patient understands and consents to the procedure. Select axial scans through the lower abdomen and pelvis were obtained. The left pelvic collection was localized and an appropriate skin entry site was determined and marked.  The operative field  was prepped with chlorhexidinein a sterile fashion, and a sterile drape was applied covering the operative field. A sterile gown and sterile gloves were used for the procedure. Local anesthesia was provided with 1% Lidocaine. Under CT fluoroscopic guidance, percutaneous entry needle advanced into the collection. Position confirmed on CT. Guidewire advanced easily, confirmed on CT. Tract dilated to facilitate placement 12 French pigtail drain catheter, placed within the dependent aspect of the collection. Approximately 50 mL of thin purulent material were aspirated, sent for Gram stain and culture. Catheter secured externally with 0 Prolene suture and StatLock and placed to gravity drain bag. The patient tolerated the procedure well. COMPLICATIONS: None immediate FINDINGS: Left lower quadrant /pelvic gas and fluid collection was again localized. 12 French pigtail drain catheter placed as above. Sample of the thin purulent aspirate sent for Gram stain and culture. IMPRESSION: Technically successful CT-guided left pelvic abscess drain catheter placement. Electronically Signed   By: Corlis Leak M.D.   On: 11/24/2019 09:41    Scheduled Meds: . amLODipine  10 mg Oral Daily  . enoxaparin (LOVENOX) injection  40 mg Subcutaneous Q24H  . gabapentin  100 mg Oral BID  . sodium chloride flush  3 mL Intravenous Q12H  . sodium chloride flush  5 mL Intracatheter Q8H   Continuous Infusions: . sodium chloride 75 mL/hr at 11/25/19 0931  . piperacillin-tazobactam (ZOSYN)  IV 3.375 g (11/25/19 1657)     LOS: 2 days   Rickey Barbara, MD [facility] Pager On Amion  If 7PM-7AM, please contact night-coverage 11/25/2019, 5:22 PM

## 2019-11-25 NOTE — Progress Notes (Addendum)
Subjective: CC: Patient reports she is doing better.  No abdominal pain, nausea or emesis.  She is tolerating liquids.  Believes she had some flatus yesterday.  No BM.  She denies any more purulent drainage during urination.  No pneumaturia or flecks of stool and urine.  Objective: Vital signs in last 24 hours: Temp:  [97.6 F (36.4 C)-98.5 F (36.9 C)] 97.6 F (36.4 C) (05/07 0253) Pulse Rate:  [59-68] 59 (05/07 0253) Resp:  [17-20] 20 (05/07 0253) BP: (119-174)/(56-71) 174/71 (05/07 0253) SpO2:  [98 %-100 %] 100 % (05/07 0253) Last BM Date: 11/23/19  Intake/Output from previous day: 05/06 0701 - 05/07 0700 In: 705.2 [P.O.:50; I.V.:655.2] Out: 10 [Drains:10] Intake/Output this shift: No intake/output data recorded.  PE: Gen:  Alert, NAD, pleasant Pulm: Normal rate and effort  Abd: Soft, NT/ND, +BS. IR drain with brown, non-transparent material in bag. 10cc/24 hours.  Ext:  No erythema, edema, or tenderness BUE/BLE  Psych: A&Ox3 (place, self, situation - believes it is 1991)with an appropriate affect  Skin: no rashes noted, warm and dry  Lab Results:  Recent Labs    11/24/19 0219 11/25/19 0300  WBC 11.0* 7.5  HGB 12.3 14.5  HCT 36.9 43.8  PLT 347 398   BMET Recent Labs    11/24/19 0219 11/25/19 0300  NA 130* 132*  K 3.5 3.9  CL 99 98  CO2 21* 20*  GLUCOSE 88 92  BUN <5* <5*  CREATININE 0.38* 0.44  CALCIUM 6.7* 6.6*   PT/INR Recent Labs    11/24/19 0219  LABPROT 14.2  INR 1.1   CMP     Component Value Date/Time   NA 132 (L) 11/25/2019 0300   K 3.9 11/25/2019 0300   CL 98 11/25/2019 0300   CO2 20 (L) 11/25/2019 0300   GLUCOSE 92 11/25/2019 0300   BUN <5 (L) 11/25/2019 0300   CREATININE 0.44 11/25/2019 0300   CALCIUM 6.6 (L) 11/25/2019 0300   PROT 5.8 (L) 11/25/2019 0300   ALBUMIN 2.9 (L) 11/25/2019 0300   AST 23 11/25/2019 0300   ALT 18 11/25/2019 0300   ALKPHOS 89 11/25/2019 0300   BILITOT 1.3 (H) 11/25/2019 0300   GFRNONAA >60  11/25/2019 0300   GFRAA >60 11/25/2019 0300   Lipase     Component Value Date/Time   LIPASE 16 11/23/2019 0655       Studies/Results: CT IMAGE GUIDED FLUID DRAIN BY CATHETER  Result Date: 11/24/2019 CLINICAL DATA:  Diverticular abscess EXAM: CT GUIDED DRAINAGE OF PELVIC ABSCESS ANESTHESIA/SEDATION: Intravenous Fentanyl and Versed 2mg  were administered as conscious sedation during continuous monitoring of the patient's level of consciousness and physiological / cardiorespiratory status by the radiology RN, with a total moderate sedation time of 15 minutes. PROCEDURE: The procedure, risks, benefits, and alternatives were explained to the patient. Questions regarding the procedure were encouraged and answered. The patient understands and consents to the procedure. Select axial scans through the lower abdomen and pelvis were obtained. The left pelvic collection was localized and an appropriate skin entry site was determined and marked. The operative field was prepped with chlorhexidinein a sterile fashion, and a sterile drape was applied covering the operative field. A sterile gown and sterile gloves were used for the procedure. Local anesthesia was provided with 1% Lidocaine. Under CT fluoroscopic guidance, percutaneous entry needle advanced into the collection. Position confirmed on CT. Guidewire advanced easily, confirmed on CT. Tract dilated to facilitate placement 12 French pigtail drain  catheter, placed within the dependent aspect of the collection. Approximately 50 mL of thin purulent material were aspirated, sent for Gram stain and culture. Catheter secured externally with 0 Prolene suture and StatLock and placed to gravity drain bag. The patient tolerated the procedure well. COMPLICATIONS: None immediate FINDINGS: Left lower quadrant /pelvic gas and fluid collection was again localized. 12 French pigtail drain catheter placed as above. Sample of the thin purulent aspirate sent for Gram  stain and culture. IMPRESSION: Technically successful CT-guided left pelvic abscess drain catheter placement. Electronically Signed   By: Lucrezia Europe M.D.   On: 11/24/2019 09:41    Anti-infectives: Anti-infectives (From admission, onward)   Start     Dose/Rate Route Frequency Ordered Stop   11/24/19 1030  piperacillin-tazobactam (ZOSYN) IVPB 3.375 g     3.375 g 12.5 mL/hr over 240 Minutes Intravenous Every 8 hours 11/24/19 0935     11/24/19 1000  cefTRIAXone (ROCEPHIN) 2 g in sodium chloride 0.9 % 100 mL IVPB  Status:  Discontinued     2 g 200 mL/hr over 30 Minutes Intravenous Every 24 hours 11/23/19 0858 11/24/19 0917   11/23/19 0915  cefTRIAXone (ROCEPHIN) 1 g in sodium chloride 0.9 % 100 mL IVPB     1 g 200 mL/hr over 30 Minutes Intravenous  Once 11/23/19 0903 11/23/19 1108   11/23/19 0900  metroNIDAZOLE (FLAGYL) IVPB 500 mg  Status:  Discontinued     500 mg 100 mL/hr over 60 Minutes Intravenous Every 8 hours 11/23/19 0858 11/24/19 0934   11/23/19 0900  cefTRIAXone (ROCEPHIN) 1 g in sodium chloride 0.9 % 100 mL IVPB  Status:  Discontinued     1 g 200 mL/hr over 30 Minutes Intravenous  Once 11/23/19 0858 11/23/19 0902   11/23/19 0845  piperacillin-tazobactam (ZOSYN) IVPB 3.375 g  Status:  Discontinued     3.375 g 12.5 mL/hr over 240 Minutes Intravenous Every 8 hours 11/23/19 0837 11/23/19 0857   11/23/19 0645  cefTRIAXone (ROCEPHIN) 1 g in sodium chloride 0.9 % 100 mL IVPB     1 g 200 mL/hr over 30 Minutes Intravenous  Once 11/23/19 7591 11/23/19 0736       Assessment/Plan 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 Prolonged QT UTI Hyponatremia - Per TR[facility] -   Recurrent Sigmoid diverticulitis with abscess measuring ~14cm - s/p IR drain 5/5 with 82ml of purulent drainage. Drain per IR - Cx pending. Cont abx  - Patient reports one episode of purulent material during urination.  Suspicion for colovesical fistula.  No further episodes. - Adv diet - No indication for emergency surgery at this time. We will follow along. We discussed that if she does not improve with above conservative therapies, that she may require an ex lap with colectomyandcolostomy during hospitalization. If she does improve with conservative therapies, will need to follow up with one of our colorectal surgeons after she gets over this bout of diverticulitis.   FEN -FLD VTE -SCDs, Lovenox ID -Rocephin/Flagyl5/5 -5/6.Zosyn 5/6 >>. WBC normalized at 7.5   LOS: 2 days    Jillyn Ledger , [facility] 11/25/2019, 8:17 AM Please see Amion for pager number during day hours 7:00am-4:30pm

## 2019-11-26 LAB — COMPREHENSIVE METABOLIC PANEL
ALT: 13 U/L (ref 0–44)
AST: 18 U/L (ref 15–41)
Albumin: 2.5 g/dL — ABNORMAL LOW (ref 3.5–5.0)
Alkaline Phosphatase: 68 U/L (ref 38–126)
Anion gap: 10 (ref 5–15)
BUN: <5 mg/dL — ABNORMAL LOW (ref 8–23)
CO2: 24 mmol/L (ref 22–32)
Calcium: 7 mg/dL — ABNORMAL LOW (ref 8.9–10.3)
Chloride: 98 mmol/L (ref 98–111)
Creatinine, Ser: 0.51 mg/dL (ref 0.44–1.00)
GFR calc Af Amer: >60 mL/min (ref >60)
GFR calc non Af Amer: >60 mL/min (ref >60)
Glucose, Bld: 111 mg/dL — ABNORMAL HIGH (ref 70–99)
Potassium: 3.5 mmol/L (ref 3.5–5.1)
Sodium: 132 mmol/L — ABNORMAL LOW (ref 135–145)
Total Bilirubin: 0.4 mg/dL (ref 0.3–1.2)
Total Protein: 5.2 g/dL — ABNORMAL LOW (ref 6.5–8.1)

## 2019-11-26 NOTE — Progress Notes (Signed)
PROGRESS NOTE    [name]  WGN:[id number] DOB: 26-Jun-1951 DOA: 11/23/2019 PCP: Philip Aspen, Limmie Patricia, MD    Brief Narrative:  69 y.o. female with medical history significant of hypertension, hyponatremia, iron deficiency anemia, alcohol abuse, duodenal ulcer, diverticular abscess, and GI bleed presented with poor appetite.  History is obtained from patient's husband who is present at bedside as she has some cognitive issues at baseline due to previous history of alcohol abuse.  He notes that she has not drank alcohol in over 1 year.  Over the last week she has had a decrease in her appetite and has not stopped eating.  Associated symptoms include weight loss, insomnia, and pus coming out of her vagina.  Patient appeared to be more confused and would ruminate on certain things with repetitive questions.  He notes that she had similar symptoms when she had a sigmoid diverticular abscess 03/2019.  Patient has not complained of any pain although was noted to be tender to palpation.  Pt presented found to have sigmoid diverticulitis with 14cm abscess  Assessment & Plan:   Principal Problem:   Abscess of sigmoid colon due to diverticulitis Active Problems:   Hyponatremia   Hypocalcemia   Long QT interval   Urinary tract infection   Metabolic encephalopathy  Sigmoid diverticulitis with abscess sepsis present on admit: Patient presented with complaints of decreased appetite and increased confusion and WBC of 23k  CT imaging significant for sigmoid diverticulitis with 14 cm abscess.   -General surgery consulted and is following -IR was consulted and pt now s/p drain placement -Cultures growing both ecoli and pseudomonas -Currently on zosyn, appropriate regimen given culture sensitivities -WBC normalized -Discussed with General Surgery. Advancing diet. If stable and tolerating soft diet, likely home at that time. Possibly in the next 24hrs  Urinary tract infection: Acute.   Urinalysis positive for large leukocytes, few bacteria, 11-20 RBCs, moderate hemoglobin, and greater than 50 WBCs. -Urine cx pending -Continue abx as per above  Leukocytosis: Acute.  WBC elevated at 23.2 with signs of left shift at time of presentation. -WBC has since normalized  Hyponatremia: Acute on chronic.  Sodium 121 on admission, but baseline appears to run in the 127-130s.  Given recent decreased appetite suspect a hypovolemic hyponatremia as the cause. -Pt had been continued on gentle IVF hydration -sodium improving, up to 132 today  Hypocalcemia: Acute.  Calcium 6.9 and was 7.6 when corrected for albumin. -Pt was given 2 g of calcium gluconate IV -Continue to monitor and replace as needed  Acute toxic metabolic encephalopathy: On chronic.  Patient noted to have some cognitive deficits due to previous history of alcohol abuse.  However, husband reportedly observed that patient was more confused than normal prior to admit  Suspect infection as likely cause of symptoms. -seems to be resolved  History of alcohol abuse: Patient has not drank alcohol in over 1 year.  Prolonged QT interval: Acute.  QTC noted to be 512 on admission. -Avoid QT prolonging medication -Correct electrolyte abnormalities as needed   DVT prophylaxis: Lovenox subq Code Status: Full Family Communication: Pt in room, family at bedside  Status is: Inpatient  Remains inpatient appropriate because:IV treatments appropriate due to intensity of illness or inability to take PO and Inpatient level of care appropriate due to severity of illness   Dispo: The patient is from: Home              Anticipated d/c is to: Home  Anticipated d/c date is: 1 day              Patient currently is not medically stable to d/c.  Consultants:   General Surgery  IR  Procedures:   Drain placement by IR 5/5  Antimicrobials: Anti-infectives (From admission, onward)   Start     Dose/Rate Route  Frequency Ordered Stop   11/24/19 1030  piperacillin-tazobactam (ZOSYN) IVPB 3.375 g     3.375 g 12.5 mL/hr over 240 Minutes Intravenous Every 8 hours 11/24/19 0935     11/24/19 1000  cefTRIAXone (ROCEPHIN) 2 g in sodium chloride 0.9 % 100 mL IVPB  Status:  Discontinued     2 g 200 mL/hr over 30 Minutes Intravenous Every 24 hours 11/23/19 0858 11/24/19 0917   11/23/19 0915  cefTRIAXone (ROCEPHIN) 1 g in sodium chloride 0.9 % 100 mL IVPB     1 g 200 mL/hr over 30 Minutes Intravenous  Once 11/23/19 0903 11/23/19 1108   11/23/19 0900  metroNIDAZOLE (FLAGYL) IVPB 500 mg  Status:  Discontinued     500 mg 100 mL/hr over 60 Minutes Intravenous Every 8 hours 11/23/19 0858 11/24/19 0934   11/23/19 0900  cefTRIAXone (ROCEPHIN) 1 g in sodium chloride 0.9 % 100 mL IVPB  Status:  Discontinued     1 g 200 mL/hr over 30 Minutes Intravenous  Once 11/23/19 0858 11/23/19 0902   11/23/19 0845  piperacillin-tazobactam (ZOSYN) IVPB 3.375 g  Status:  Discontinued     3.375 g 12.5 mL/hr over 240 Minutes Intravenous Every 8 hours 11/23/19 0837 11/23/19 0857   11/23/19 0645  cefTRIAXone (ROCEPHIN) 1 g in sodium chloride 0.9 % 100 mL IVPB     1 g 200 mL/hr over 30 Minutes Intravenous  Once 11/23/19 0635 11/23/19 0736      Subjective: States feeling better. Randel Pigg on going home soon  Objective: Vitals:   11/25/19 0253 11/25/19 1554 11/25/19 2050 11/26/19 0441  BP: (!) 174/71 (!) 143/75 (!) 152/68 (!) 124/57  Pulse: (!) 59 69 60 (!) 56  Resp: 20 16 16 16   Temp: 97.6 F (36.4 C) 97.9 F (36.6 C) 97.9 F (36.6 C) 98.4 F (36.9 C)  TempSrc: Oral Oral Oral Oral  SpO2: 100% 99% 100% 98%  Weight:      Height:        Intake/Output Summary (Last 24 hours) at 11/26/2019 1429 Last data filed at 11/26/2019 0800 Gross per 24 hour  Intake 2256.3 ml  Output 70 ml  Net 2186.3 ml   Filed Weights   11/23/19 0454  Weight: 55 kg    Examination: General exam: Conversant, in no acute distress Respiratory system:  normal chest rise, clear, no audible wheezing Cardiovascular system: regular rhythm, s1-s2 Gastrointestinal system: Nondistended, nontender, pos BS Central nervous system: No seizures, no tremors Extremities: No cyanosis, no joint deformities Skin: No rashes, no pallor Psychiatry: Affect normal // no auditory hallucinations   Data Reviewed: I have personally reviewed following labs and imaging studies  CBC: Recent Labs  Lab 11/23/19 0511 11/24/19 0219 11/25/19 0300 11/26/19 0802  WBC 23.2* 11.0* 7.5 10.5  NEUTROABS 20.1*  --   --   --   HGB 13.3 12.3 14.5 12.8  HCT 38.7 36.9 43.8 38.7  MCV 91.9 93.4 93.4 94.2  PLT 436* 347 398 188   Basic Metabolic Panel: Recent Labs  Lab 11/23/19 0511 11/23/19 1356 11/24/19 0219 11/25/19 0300 11/26/19 0802  NA 121* 131* 130* 132* 132*  K 4.4  3.7 3.5 3.9 3.5  CL 88* 96* 99 98 98  CO2 21* 23 21* 20* 24  GLUCOSE 125* 102* 88 92 111*  BUN <5* <5* <5* <5* <5*  CREATININE 0.54 0.44 0.38* 0.44 0.51  CALCIUM 6.9* 6.3* 6.7* 6.6* 7.0*   GFR: Estimated Creatinine Clearance: 54 mL/min (by C-G formula based on SCr of 0.51 mg/dL). Liver Function Tests: Recent Labs  Lab 11/23/19 0511 11/25/19 0300 11/26/19 0802  AST 24 23 18   ALT 22 18 13   ALKPHOS 150* 89 68  BILITOT 0.9 1.3* 0.4  PROT 6.6 5.8* 5.2*  ALBUMIN 3.1* 2.9* 2.5*   Recent Labs  Lab 11/23/19 0655  LIPASE 16   No results for input(s): AMMONIA in the last 168 hours. Coagulation Profile: Recent Labs  Lab 11/24/19 0219  INR 1.1   Cardiac Enzymes: No results for input(s): CKTOTAL, CKMB, CKMBINDEX, TROPONINI in the last 168 hours. BNP (last 3 results) No results for input(s): PROBNP in the last 8760 hours. HbA1C: No results for input(s): HGBA1C in the last 72 hours. CBG: No results for input(s): GLUCAP in the last 168 hours. Lipid Profile: No results for input(s): CHOL, HDL, LDLCALC, TRIG, CHOLHDL, LDLDIRECT in the last 72 hours. Thyroid Function Tests: No results  for input(s): TSH, T4TOTAL, FREET4, T3FREE, THYROIDAB in the last 72 hours. Anemia Panel: No results for input(s): VITAMINB12, FOLATE, FERRITIN, TIBC, IRON, RETICCTPCT in the last 72 hours. Sepsis Labs: No results for input(s): PROCALCITON, LATICACIDVEN in the last 168 hours.  Recent Results (from the past 240 hour(s))  Respiratory Panel by RT PCR (Flu A&B, Covid) - Nasopharyngeal Swab     Status: None   Collection Time: 11/23/19  6:34 AM   Specimen: Nasopharyngeal Swab  Result Value Ref Range Status   SARS Coronavirus 2 by RT PCR NEGATIVE NEGATIVE Final    Comment: (NOTE) SARS-CoV-2 target nucleic acids are NOT DETECTED. The SARS-CoV-2 RNA is generally detectable in upper respiratoy specimens during the acute phase of infection. The lowest concentration of SARS-CoV-2 viral copies this assay can detect is 131 copies/mL. A negative result does not preclude SARS-Cov-2 infection and should not be used as the sole basis for treatment or other patient management decisions. A negative result may occur with  improper specimen collection/handling, submission of specimen other than nasopharyngeal swab, presence of viral mutation(s) within the areas targeted by this assay, and inadequate number of viral copies (<131 copies/mL). A negative result must be combined with clinical observations, patient history, and epidemiological information. The expected result is Negative. Fact Sheet for Patients:  https://www.moore.com/ Fact Sheet for Healthcare Providers:  https://www.young.biz/ This test is not yet ap proved or cleared by the [place] FDA and  has been authorized for detection and/or diagnosis of SARS-CoV-2 by FDA under an Emergency Use Authorization (EUA). This EUA will remain  in effect (meaning this test can be used) for the duration of the COVID-19 declaration under Section 564(b)(1) of the Act, 21 U.S.C. section 360bbb-3(b)(1), unless the  authorization is terminated or revoked sooner.    Influenza A by PCR NEGATIVE NEGATIVE Final   Influenza B by PCR NEGATIVE NEGATIVE Final    Comment: (NOTE) The Xpert Xpress SARS-CoV-2/FLU/RSV assay is intended as an aid in  the diagnosis of influenza from Nasopharyngeal swab specimens and  should not be used as a sole basis for treatment. Nasal washings and  aspirates are unacceptable for Xpert Xpress SARS-CoV-2/FLU/RSV  testing. Fact Sheet for Patients: https://www.moore.com/ Fact Sheet for Healthcare Providers: https://www.young.biz/  This test is not yet approved or cleared by the [place] and  has been authorized for detection and/or diagnosis of SARS-CoV-2 by  FDA under an Emergency Use Authorization (EUA). This EUA will remain  in effect (meaning this test can be used) for the duration of the  Covid-19 declaration under Section 564(b)(1) of the Act, 21  U.S.C. section 360bbb-3(b)(1), unless the authorization is  terminated or revoked. Performed at [facility], [address], [place], [place] [address]   Urine culture     Status: None   Collection Time: 11/23/19  7:36 AM   Specimen: Urine, Random  Result Value Ref Range Status   Specimen Description URINE, RANDOM  Final   Special Requests NONE  Final   Culture   Final    NO GROWTH Performed at [facility], [address] [place], [place] [address]    Report Status 11/24/2019 FINAL  Final  Culture, blood (routine x 2)     Status: None (Preliminary result)   Collection Time: 11/23/19  9:36 AM   Specimen: BLOOD LEFT HAND  Result Value Ref Range Status   Specimen Description BLOOD LEFT HAND  Final   Special Requests   Final    AEROBIC BOTTLE ONLY Blood Culture results may not be optimal due to an inadequate volume of blood received in culture bottles   Culture   Final    NO GROWTH 3 DAYS Performed at [facility], [address], [place], [place]  [address]    Report Status PENDING  Incomplete  Culture, blood (routine x 2)     Status: None (Preliminary result)   Collection Time: 11/23/19  9:36 AM   Specimen: BLOOD  Result Value Ref Range Status   Specimen Description BLOOD RIGHT ANTECUBITAL  Final   Special Requests   Final    AEROBIC BOTTLE ONLY Blood Culture results may not be optimal due to an inadequate volume of blood received in culture bottles   Culture   Final    NO GROWTH 3 DAYS Performed at [facility], [address], [place], [place] [address]    Report Status PENDING  Incomplete  Aerobic/Anaerobic Culture (surgical/deep wound)     Status: None (Preliminary result)   Collection Time: 11/23/19  6:09 PM   Specimen: Abscess  Result Value Ref Range Status   Specimen Description ABSCESS  Final   Special Requests NONE  Final   Gram Stain   Final    ABUNDANT WBC PRESENT, PREDOMINANTLY PMN MODERATE GRAM POSITIVE COCCI FEW GRAM POSITIVE RODS Performed at [facility], [address] [place], [place] [address]    Culture   Final    RARE ESCHERICHIA COLI FEW PSEUDOMONAS AERUGINOSA NO ANAEROBES ISOLATED; CULTURE IN PROGRESS FOR 5 DAYS    Report Status PENDING  Incomplete   Organism ID, Bacteria ESCHERICHIA COLI  Final   Organism ID, Bacteria PSEUDOMONAS AERUGINOSA  Final      Susceptibility   Escherichia coli - MIC*    AMPICILLIN >=32 RESISTANT Resistant     CEFAZOLIN <=4 SENSITIVE Sensitive     CEFEPIME <=1 SENSITIVE Sensitive     CEFTAZIDIME <=1 SENSITIVE Sensitive     CEFTRIAXONE <=1 SENSITIVE Sensitive     CIPROFLOXACIN <=0.25 SENSITIVE Sensitive     GENTAMICIN <=1 SENSITIVE Sensitive     IMIPENEM <=0.25 SENSITIVE Sensitive     TRIMETH/SULFA <=20 SENSITIVE Sensitive     AMPICILLIN/SULBACTAM 8 SENSITIVE  Sensitive     PIP/TAZO <=4 SENSITIVE Sensitive     * RARE ESCHERICHIA COLI   Pseudomonas aeruginosa - MIC*    CEFTAZIDIME <=1 SENSITIVE Sensitive     CIPROFLOXACIN <=0.25 SENSITIVE Sensitive      GENTAMICIN <=1 SENSITIVE Sensitive     IMIPENEM 2 SENSITIVE Sensitive     PIP/TAZO 16 SENSITIVE Sensitive     CEFEPIME <=1 SENSITIVE Sensitive     * FEW PSEUDOMONAS AERUGINOSA     Radiology Studies: No results found.  Scheduled Meds: . amLODipine  10 mg Oral Daily  . enoxaparin (LOVENOX) injection  40 mg Subcutaneous Q24H  . gabapentin  100 mg Oral BID  . sodium chloride flush  3 mL Intravenous Q12H  . sodium chloride flush  5 mL Intracatheter Q8H   Continuous Infusions: . sodium chloride 75 mL/hr at 11/26/19 0706  . piperacillin-tazobactam (ZOSYN)  IV 3.375 g (11/26/19 0906)     LOS: 3 days   Rickey Barbara, MD [facility] Pager On Amion  If 7PM-7AM, please contact night-coverage 11/26/2019, 2:29 PM

## 2019-11-26 NOTE — Progress Notes (Signed)
Responded to patients bed alarm going off. When I entered the room, patient was ambulating to restroom with family member. Educated patient and family member on the call bell system and the importance of calling for help prior to getting out of bed. Both patient and family member verbalize understanding and demonstrate proper use of call bell system.

## 2019-11-26 NOTE — Progress Notes (Signed)
Subjective: CC: Patient reports that she is doing well.  Tolerating full liquids without any increased abdominal pain, nausea or vomiting.  Some abdominal pain around her drain in the left lower quadrant but reports this is mild.  She is passing flatus.  BM yesterday.  No more purulent drainage during urination.  No pneumaturia or flecks of stool and urine.  Objective: Vital signs in last 24 hours: Temp:  [97.9 F (36.6 C)-98.4 F (36.9 C)] 98.4 F (36.9 C) (05/08 0441) Pulse Rate:  [56-69] 56 (05/08 0441) Resp:  [16] 16 (05/08 0441) BP: (124-152)/(57-75) 124/57 (05/08 0441) SpO2:  [98 %-100 %] 98 % (05/08 0441) Last BM Date: 11/25/19  Intake/Output from previous day: 05/07 0701 - 05/08 0700 In: 2256.3 [P.O.:240; I.V.:1811.3; IV Piggyback:200] Out: 45 [Drains:45] Intake/Output this shift: Total I/O In: -  Out: 25 [Drains:25]  PE: Gen: Alert, NAD, pleasant Pulm: Normal rate and effort Abd: Soft, ND, very mild tenderness of the LLQ without rigidity or guarding. +BS. IR drain with bloody purulent material in bag.25cc/24 hours.  Ext: No erythema, edema, or tenderness BUE/BLE  Psych: A&Ox3 (place, self, situation - not time)with an appropriate affect Skin: no rashes noted, warm and dry  Lab Results:  Recent Labs    11/25/19 0300 11/26/19 0802  WBC 7.5 10.5  HGB 14.5 12.8  HCT 43.8 38.7  PLT 398 188   BMET Recent Labs    11/24/19 0219 11/25/19 0300  NA 130* 132*  K 3.5 3.9  CL 99 98  CO2 21* 20*  GLUCOSE 88 92  BUN <5* <5*  CREATININE 0.38* 0.44  CALCIUM 6.7* 6.6*   PT/INR Recent Labs    11/24/19 0219  LABPROT 14.2  INR 1.1   CMP     Component Value Date/Time   NA 132 (L) 11/25/2019 0300   K 3.9 11/25/2019 0300   CL 98 11/25/2019 0300   CO2 20 (L) 11/25/2019 0300   GLUCOSE 92 11/25/2019 0300   BUN <5 (L) 11/25/2019 0300   CREATININE 0.44 11/25/2019 0300   CALCIUM 6.6 (L) 11/25/2019 0300   PROT 5.8 (L) 11/25/2019 0300   ALBUMIN 2.9  (L) 11/25/2019 0300   AST 23 11/25/2019 0300   ALT 18 11/25/2019 0300   ALKPHOS 89 11/25/2019 0300   BILITOT 1.3 (H) 11/25/2019 0300   GFRNONAA >60 11/25/2019 0300   GFRAA >60 11/25/2019 0300   Lipase     Component Value Date/Time   LIPASE 16 11/23/2019 0655       Studies/Results: No results found.  Anti-infectives: Anti-infectives (From admission, onward)   Start     Dose/Rate Route Frequency Ordered Stop   11/24/19 1030  piperacillin-tazobactam (ZOSYN) IVPB 3.375 g     3.375 g 12.5 mL/hr over 240 Minutes Intravenous Every 8 hours 11/24/19 0935     11/24/19 1000  cefTRIAXone (ROCEPHIN) 2 g in sodium chloride 0.9 % 100 mL IVPB  Status:  Discontinued     2 g 200 mL/hr over 30 Minutes Intravenous Every 24 hours 11/23/19 0858 11/24/19 0917   11/23/19 0915  cefTRIAXone (ROCEPHIN) 1 g in sodium chloride 0.9 % 100 mL IVPB     1 g 200 mL/hr over 30 Minutes Intravenous  Once 11/23/19 0903 11/23/19 1108   11/23/19 0900  metroNIDAZOLE (FLAGYL) IVPB 500 mg  Status:  Discontinued     500 mg 100 mL/hr over 60 Minutes Intravenous Every 8 hours 11/23/19 0858 11/24/19 0934   11/23/19 0900  cefTRIAXone (ROCEPHIN) 1 g in sodium chloride 0.9 % 100 mL IVPB  Status:  Discontinued     1 g 200 mL/hr over 30 Minutes Intravenous  Once 11/23/19 0858 11/23/19 0902   11/23/19 0845  piperacillin-tazobactam (ZOSYN) IVPB 3.375 g  Status:  Discontinued     3.375 g 12.5 mL/hr over 240 Minutes Intravenous Every 8 hours 11/23/19 0837 11/23/19 0857   11/23/19 0645  cefTRIAXone (ROCEPHIN) 1 g in sodium chloride 0.9 % 100 mL IVPB     1 g 200 mL/hr over 30 Minutes Intravenous  Once 11/23/19 9373 11/23/19 0736       Assessment/Plan 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 Prolonged QT UTI Hyponatremia - Per TRH -   Recurrent Sigmoid diverticulitis with abscess measuring ~14cm -s/p IR drain 5/5 with 45ml of purulent drainage. Drain  per IR - Cx with Pseudomonas aeruginosa.  Susceptibilities pending.  Cont abx. Will need 10-14 days.  -Patient reports one episode of purulent material during urination. Suspicion for colovesical fistula. No further episodes. -Adv diet  - Will need follow up with IR and colorectal surgeon  FEN - Soft  VTE -SCDs, Lovenox ID -Rocephin/Flagyl5/5 -5/6.Zosyn 5/6 >>. WBC 10.5 Follow-Up - Dr. Marcello Moores   LOS: 3 days    Jillyn Ledger , [facility] 11/26/2019, 8:55 AM Please see Amion for pager number during day hours 7:00am-4:30pm

## 2019-11-26 NOTE — Progress Notes (Signed)
Patient ambulated in hallway with family member. Patient ambulated the distance of the nursing unit without difficulty and without signs of distress. Patient had no complaints of trouble or discomfort during this activity.

## 2019-11-27 LAB — CBC

## 2019-11-27 MED ORDER — SODIUM CHLORIDE 0.9% FLUSH: 5.0000 mL | Freq: Every day | INTRAVENOUS | 1 refills | Status: DC

## 2019-11-27 MED ORDER — AMOXICILLIN-POT CLAVULANATE 875-125 MG PO TABS
1.0000 | ORAL_TABLET | Freq: Two times a day (BID) | ORAL | 0 refills | Status: AC
Start: 2019-11-27 — End: 2019-12-08

## 2019-11-27 NOTE — Progress Notes (Signed)
Subjective: CC: No abdominal pain, n/v. Tolerating diet. Passing flatus. BM this am. Wants to go home.   Objective: Vital signs in last 24 hours: Temp:  [97.3 F (36.3 C)-98.7 F (37.1 C)] 97.3 F (36.3 C) (05/09 0515) Pulse Rate:  [59-64] 64 (05/09 0515) Resp:  [16-18] 17 (05/09 0515) BP: (112-152)/(56-74) 152/74 (05/09 0829) SpO2:  [98 %-100 %] 98 % (05/09 0515) Last BM Date: 11/27/19  Intake/Output from previous day: 05/08 0701 - 05/09 0700 In: 711 [P.O.:711] Out: 75 [Drains:75] Intake/Output this shift: No intake/output data recorded.  PE: Gen: Alert, NAD, pleasant Pulm: Normal rate and effort Abd: Soft, ND, vNT. +BS. IR drain with purulent material in bag.75cc/24 hours. Ext: No erythema, edema, or tenderness BUE/BLE  Psych: A&Ox3 (place, self, situation- not time)with an appropriate affect Skin: no rashes noted, warm and dry  Lab Results:  Recent Labs    11/25/19 0300 11/26/19 0802  WBC 7.5 10.5  HGB 14.5 12.8  HCT 43.8 38.7  PLT 398 188   BMET Recent Labs    11/25/19 0300 11/26/19 0802  NA 132* 132*  K 3.9 3.5  CL 98 98  CO2 20* 24  GLUCOSE 92 111*  BUN <5* <5*  CREATININE 0.44 0.51  CALCIUM 6.6* 7.0*   PT/INR No results for input(s): LABPROT, INR in the last 72 hours. CMP     Component Value Date/Time   NA 132 (L) 11/26/2019 0802   K 3.5 11/26/2019 0802   CL 98 11/26/2019 0802   CO2 24 11/26/2019 0802   GLUCOSE 111 (H) 11/26/2019 0802   BUN <5 (L) 11/26/2019 0802   CREATININE 0.51 11/26/2019 0802   CALCIUM 7.0 (L) 11/26/2019 0802   PROT 5.2 (L) 11/26/2019 0802   ALBUMIN 2.5 (L) 11/26/2019 0802   AST 18 11/26/2019 0802   ALT 13 11/26/2019 0802   ALKPHOS 68 11/26/2019 0802   BILITOT 0.4 11/26/2019 0802   GFRNONAA >60 11/26/2019 0802   GFRAA >60 11/26/2019 0802   Lipase     Component Value Date/Time   LIPASE 16 11/23/2019 0655       Studies/Results: No results found.  Anti-infectives: Anti-infectives (From  admission, onward)   Start     Dose/Rate Route Frequency Ordered Stop   11/24/19 1030  piperacillin-tazobactam (ZOSYN) IVPB 3.375 g     3.375 g 12.5 mL/hr over 240 Minutes Intravenous Every 8 hours 11/24/19 0935     11/24/19 1000  cefTRIAXone (ROCEPHIN) 2 g in sodium chloride 0.9 % 100 mL IVPB  Status:  Discontinued     2 g 200 mL/hr over 30 Minutes Intravenous Every 24 hours 11/23/19 0858 11/24/19 0917   11/23/19 0915  cefTRIAXone (ROCEPHIN) 1 g in sodium chloride 0.9 % 100 mL IVPB     1 g 200 mL/hr over 30 Minutes Intravenous  Once 11/23/19 0903 11/23/19 1108   11/23/19 0900  metroNIDAZOLE (FLAGYL) IVPB 500 mg  Status:  Discontinued     500 mg 100 mL/hr over 60 Minutes Intravenous Every 8 hours 11/23/19 0858 11/24/19 0934   11/23/19 0900  cefTRIAXone (ROCEPHIN) 1 g in sodium chloride 0.9 % 100 mL IVPB  Status:  Discontinued     1 g 200 mL/hr over 30 Minutes Intravenous  Once 11/23/19 0858 11/23/19 0902   11/23/19 0845  piperacillin-tazobactam (ZOSYN) IVPB 3.375 g  Status:  Discontinued     3.375 g 12.5 mL/hr over 240 Minutes Intravenous Every 8 hours 11/23/19 0837 11/23/19 0857  11/23/19 0645  cefTRIAXone (ROCEPHIN) 1 g in sodium chloride 0.9 % 100 mL IVPB     1 g 200 mL/hr over 30 Minutes Intravenous  Once 11/23/19 4540 11/23/19 0736       Assessment/Plan 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 Prolonged QT UTI Hyponatremia - Per TRH -   Recurrent Sigmoid diverticulitis with abscess measuring ~14cm -s/p IR drain 5/5 with 30ml of purulent drainage.Drain per IR - Cx with Pseudomonas aeruginosa.  Cont abx. Can switch to oral -Patient reportsoneepisode of purulent material during urination. Suspicion for colovesical fistula. No further episodes. -Okay for discharge from our standpoint with 14d of abx and follow up with IR and colorectal surgeon  FEN - Soft  VTE -SCDs, Lovenox ID  -Rocephin/Flagyl5/5-5/6.Zosyn 5/6 >>. [id number] 5/8 Follow-Up - Dr. Maisie Fus   LOS: 4 days    Jacinto Halim , [facility] 11/27/2019, 9:16 AM Please see Amion for pager number during day hours 7:00am-4:30pm

## 2019-11-27 NOTE — Discharge Summary (Signed)
Physician Discharge Summary  Kirsten QuintonDiane Melvin Rivera ZOX:[id number] DOB: 1951/01/01 DOA: 11/23/2019  PCP: Philip AspenHernandez Acosta, Limmie PatriciaEstela Y, MD  Admit date: 11/23/2019 Discharge date: 11/27/2019  Admitted From: Home Disposition:  Home  Recommendations for Outpatient Follow-up:  1. Follow up with PCP in 2-3 weeks 2. Follow up with IR as scheduled 3. Follow up with General Surgery as scheduled 4. Of note, pt has documented PCN allergy but has tolerated zosyn well without any ill effects.   Discharge Condition:Improved CODE STATUS:Full Diet recommendation: Regular   Brief/Interim Summary: 69 y.o.femalewith medical history significant ofhypertension, hyponatremia, iron deficiency anemia, alcohol abuse, duodenal ulcer, diverticular abscess,andGI bleedpresented with poor appetite. History is obtained from patient's husband who is present at bedside as she has some cognitive issues atbaseline due to previous history of alcohol abuse. He notes that she has not drank alcohol in over 1 year. Over the last week she has had a decrease in her appetite and has not stopped eating. Associated symptoms include weight loss,insomnia, and pus coming out of her vagina. Patient appeared to be more confused and would ruminate on certain things with repetitive questions. He notes that she had similar symptoms when she had a sigmoid diverticular abscess 03/2019.Patient has not complained of any pain although was noted to be tender to palpation.  Pt presented found to have sigmoid diverticulitis with 14cm abscess  Discharge Diagnoses:  Principal Problem:   Abscess of sigmoid colon due to diverticulitis Active Problems:   Hyponatremia   Hypocalcemia   Long QT interval   Urinary tract infection   Metabolic encephalopathy   Sigmoid diverticulitis with abscess sepsis present on admit:Patient presented with complaints of decreased appetite and increased confusion and WBC of 23k CT imaging significant for  sigmoid diverticulitis with 14 cm abscess.  -General surgery consulted and is following -IR was consulted and pt now s/p drain placement -Cultures growing both ecoli and pseudomonas -Pt had been on zosyn, appropriate regimen given culture sensitivities -WBC normalized -Discussed with General Surgery. Advanced diet. Per General Surgery, Ok to d/c today with oral abx. Will discharge with augmentin to complete total 14 days abx. Of note, patient has documented PCN allergy, however has tolerated zosyn without any ill effects  Urinary tract infection: Acute. Urinalysis positive for large leukocytes, few bacteria, 11-20 RBCs, moderate hemoglobin, and greater than 50 WBCs. -Urine cx without growth -complete abx per aboev  Leukocytosis: Acute. WBC elevated at 23.2 with signs of left shift at time of presentation. -WBC has since normalized  Hyponatremia: Acute on chronic. Sodium 121 on admission, but baseline appears to run in the 127-130s.Given recent decreased appetite suspect a hypovolemic hyponatremia as the cause. -Pt had been continued on gentle IVF hydration -sodium improving  Hypocalcemia: Acute.Calcium 6.9 and was 7.6 when corrected for albumin. -Pt was given 2 g of calcium gluconate IV -Remained stable  Acute toxic metabolic encephalopathy: On chronic. Patient noted to have some cognitive deficits due to previous history of alcohol abuse. However, husband reportedly observed that patient was more confused than normal prior to admit Suspect infection as likely cause of symptoms. -seems to be resolved  History of alcohol abuse: Patient has not drank alcohol in over 1 year.  Prolonged QT interval: Acute. QTC noted to be 512 on admission. -Avoid QT prolonging medications  Discharge Instructions   Allergies as of 11/27/2019      Reactions   Sulfa Antibiotics Anaphylaxis, Rash   Sulfonamide Derivatives Anaphylaxis, Rash   Dilaudid [hydromorphone Hcl] Hives    Penicillins  Other (See Comments)   Has patient had a PCN reaction causing immediate rash, facial/tongue/throat swelling, SOB or lightheadedness with hypotension: No Has patient had a PCN reaction causing severe rash involving mucus membranes or skin necrosis: No Has patient had a PCN reaction that required hospitalization No Has patient had a PCN reaction occurring within the last 10 years: No If all of the above answers are "NO", then may proceed with Cephalosporin use.   Hydromorphone Hcl Rash      Medication List    STOP taking these medications   aspirin 81 MG chewable tablet     TAKE these medications   amLODipine 10 MG tablet Commonly known as: NORVASC TAKE 1 TABLET BY MOUTH EVERY DAY   amoxicillin-clavulanate 875-125 MG tablet Commonly known as: Augmentin Take 1 tablet by mouth 2 (two) times daily for 11 days.   denosumab 60 MG/ML Sosy injection Commonly known as: PROLIA Inject 60 mg into the skin every 6 (six) months.   folic acid 1 MG tablet Commonly known as: FOLVITE Take 1 tablet (1 mg total) by mouth daily.   gabapentin 100 MG capsule Commonly known as: NEURONTIN TAKE 1 CAPSULE BY MOUTH TWICE A DAY   multivitamin with minerals Tabs tablet Take 1 tablet by mouth daily.   pantoprazole 40 MG tablet Commonly known as: PROTONIX TAKE 1 TABLET BY MOUTH EVERY DAY   sodium chloride flush 0.9 % Soln Commonly known as: NS Inject 5 mLs into the vein daily.   thiamine 100 MG tablet Take 1 tablet (100 mg total) by mouth daily.      Follow-up Information    [name], MD. Schedule an appointment as soon as possible for a visit in 3 week(s).   Specialty: General Surgery Contact information: [address] [place] [address] [phone]        Oley Balm, MD Follow up in 2 week(s).   Specialties: Interventional Radiology, Radiology Why: follow up 2 weeks with Dr Deanne Coffer- scheduler will call pt with appt date and time- call  [phone] if any questions/concerns Contact information: [address] [place] [address] [phone]        Philip Aspen, Limmie Patricia, MD. Schedule an appointment as soon as possible for a visit in 2 week(s).   Specialty: Internal Medicine Contact information: [address] [place] [address] [phone]          Allergies  Allergen Reactions  . Sulfa Antibiotics Anaphylaxis and Rash  . Sulfonamide Derivatives Anaphylaxis and Rash  . Dilaudid [Hydromorphone Hcl] Hives  . Penicillins Other (See Comments)    Has patient had a PCN reaction causing immediate rash, facial/tongue/throat swelling, SOB or lightheadedness with hypotension: No Has patient had a PCN reaction causing severe rash involving mucus membranes or skin necrosis: No Has patient had a PCN reaction that required hospitalization No Has patient had a PCN reaction occurring within the last 10 years: No If all of the above answers are "NO", then may proceed with Cephalosporin use.  Marland Kitchen Hydromorphone Hcl Rash    Consultations:  General Surgery   IR  Procedures/Studies: CT ABDOMEN PELVIS W CONTRAST  Result Date: 11/23/2019 CLINICAL DATA:  Frequent urination, poor appetite EXAM: CT ABDOMEN AND PELVIS WITH CONTRAST TECHNIQUE: Multidetector CT imaging of the abdomen and pelvis was performed using the standard protocol following bolus administration of intravenous contrast. CONTRAST:  OMNIPAQUE IOHEXOL 300 MG/ML  SOLN COMPARISON:  04/12/2019 FINDINGS: Lower chest: No acute abnormality.  Hepatobiliary: Unchanged 1.0 cm low-density lesion at the right hepatic dome, likely cyst or hemangioma. No new focal liver lesions. There may be a trace amount of layering sludge or small stones within the gallbladder lumen. No inflammatory changes are seen. No biliary dilatation. Pancreas: Unremarkable. No pancreatic ductal dilatation or surrounding inflammatory changes. Spleen: Normal in size without  focal abnormality. Adrenals/Urinary Tract: Unremarkable adrenal glands. Unchanged round low-density subcentimeter focus in the left kidney, too small to definitively characterize, but most likely represents cyst. Kidneys are otherwise unremarkable. No renal stone or hydronephrosis. There is thickening of the left wall of the urinary bladder adjacent to inflammatory change in the left hemipelvis. No intraluminal air within the bladder. Stomach/Bowel: Extensive sigmoid diverticulosis with long segment wall thickening, pericolonic inflammatory changes and free fluid. Large, complex multiloculated diverticular abscess adjacent to the sigmoid colon. Collection is difficult to accurately measure as it insinuates along the course of the inflamed sigmoid colon. The more inferior aspect of the collection is located within the posterior cul-de-sac and extends along the course of the inflamed sigmoid colon and abuts the left lateral aspect of the urinary bladder measuring up to 14 cm in maximal dimension (series 7, image 99; series 3, images 58-73). No bowel obstruction. 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 Vascular/Lymphatic: Aortoiliac atherosclerotic calcification without aneurysm. No lymphadenopathy. Reproductive: Loss of fat plane between the uterus and diverticular collection. Other: No large volume of free air. Musculoskeletal: No acute osseous findings. IMPRESSION: 1. Extensive sigmoid diverticulitis. Large, complex multiloculated diverticular abscess adjacent to the sigmoid colon measuring up to 14 cm in maximal dimension. 2. Thickening of the left wall of the urinary bladder adjacent to the inflammatory change in the left hemipelvis. No intraluminal air within the bladder to suggest fistula. These results were called by telephone at the time of interpretation on 11/23/2019 at 8:18 am to provider Dr Renaye Rakers, who verbally acknowledged these results. Electronically Signed   By: Duanne Guess  D.O.   On: 11/23/2019 08:21   CT IMAGE GUIDED FLUID DRAIN BY CATHETER  Result Date: 11/24/2019 CLINICAL DATA:  Diverticular abscess EXAM: CT GUIDED DRAINAGE OF PELVIC ABSCESS ANESTHESIA/SEDATION: Intravenous Fentanyl and Versed  were administered as conscious sedation during continuous monitoring of the patient's level of consciousness and physiological / cardiorespiratory status by the radiology RN, with a total moderate sedation time of 15 minutes. PROCEDURE: The procedure, risks, benefits, and alternatives were explained to the patient. Questions regarding the procedure were encouraged and answered. The patient understands and consents to the procedure. Select axial scans through the lower abdomen and pelvis were obtained. The left pelvic collection was localized and an appropriate skin entry site was determined and marked. The operative field was prepped with chlorhexidinein a sterile fashion, and a sterile drape was applied covering the operative field. A sterile gown and sterile gloves were used for the procedure. Local anesthesia was provided with 1% Lidocaine. Under CT fluoroscopic guidance, percutaneous entry needle advanced into the collection. Position confirmed on CT. Guidewire advanced easily, confirmed on CT. Tract dilated to facilitate placement 12 French pigtail drain catheter, placed within the dependent aspect of the collection. Approximately 50 mL of thin purulent material were aspirated, sent for Gram stain and culture. Catheter secured externally with 0 Prolene suture and StatLock and placed to gravity drain bag. The patient tolerated the procedure well. COMPLICATIONS: None immediate FINDINGS: Left lower quadrant /pelvic gas and fluid collection was again localized. 12 French pigtail drain catheter placed as above. Sample  of the thin purulent aspirate sent for Gram stain and culture. IMPRESSION: Technically successful CT-guided left pelvic abscess drain catheter placement.  Electronically Signed   By: Corlis Leak M.D.   On: 11/24/2019 09:41     Subjective: Eager to go home  Discharge Exam: Vitals:   11/27/19 0515 11/27/19 0829  BP: 139/66 (!) 152/74  Pulse: 64   Resp: 17   Temp: (!) 97.3 F (36.3 C)   SpO2: 98%    Vitals:   11/26/19 1608 11/26/19 2029 11/27/19 0515 11/27/19 0829  BP: (!) 112/56 139/71 139/66 (!) 152/74  Pulse: (!) 59 (!) 59 64   Resp: 16 18 17    Temp: 98.7 F (37.1 C) (!) 97.5 F (36.4 C) (!) 97.3 F (36.3 C)   TempSrc: Oral  Oral   SpO2: 100% 100% 98%   Weight:      Height:        General: Pt is alert, awake, not in acute distress Cardiovascular: RRR, S1/S2 +, no rubs, no gallops Respiratory: CTA bilaterally, no wheezing, no rhonchi Abdominal: Soft, NT, ND, bowel sounds + Extremities: no edema, no cyanosis   The results of significant diagnostics from this hospitalization (including imaging, microbiology, ancillary and laboratory) are listed below for reference.     Microbiology: Recent Results (from the past 240 hour(s))  Respiratory Panel by RT PCR (Flu A&B, Covid) - Nasopharyngeal Swab     Status: None   Collection Time: 11/23/19  6:34 AM   Specimen: Nasopharyngeal Swab  Result Value Ref Range Status   SARS Coronavirus 2 by RT PCR NEGATIVE NEGATIVE Final    Comment: (NOTE) SARS-CoV-2 target nucleic acids are NOT DETECTED. The SARS-CoV-2 RNA is generally detectable in upper respiratoy specimens during the acute phase of infection. The lowest concentration of SARS-CoV-2 viral copies this assay can detect is 131 copies/mL. A negative result does not preclude SARS-Cov-2 infection and should not be used as the sole basis for treatment or other patient management decisions. A negative result may occur with  improper specimen collection/handling, submission of specimen other than nasopharyngeal swab, presence of viral mutation(s) within the areas targeted by this assay, and inadequate number of viral copies (<131  copies/mL). A negative result must be combined with clinical observations, patient history, and epidemiological information. The expected result is Negative. Fact Sheet for Patients:  01/23/20 Fact Sheet for Healthcare Providers:  https://www.moore.com/ This test is not yet ap proved or cleared by the https://www.young.biz/ FDA and  has been authorized for detection and/or diagnosis of SARS-CoV-2 by FDA under an Emergency Use Authorization (EUA). This EUA will remain  in effect (meaning this test can be used) for the duration of the COVID-19 declaration under Section 564(b)(1) of the Act, 21 U.S.C. section 360bbb-3(b)(1), unless the authorization is terminated or revoked sooner.    Influenza A by PCR NEGATIVE NEGATIVE Final   Influenza B by PCR NEGATIVE NEGATIVE Final    Comment: (NOTE) The Xpert Xpress SARS-CoV-2/FLU/RSV assay is intended as an aid in  the diagnosis of influenza from Nasopharyngeal swab specimens and  should not be used as a sole basis for treatment. Nasal washings and  aspirates are unacceptable for Xpert Xpress SARS-CoV-2/FLU/RSV  testing. Fact Sheet for Patients: [place] Fact Sheet for Healthcare Providers: https://www.moore.com/ This test is not yet approved or cleared by the https://www.young.biz/ FDA and  has been authorized for detection and/or diagnosis of SARS-CoV-2 by  FDA under an Emergency Use Authorization (EUA). This EUA will remain  in effect (meaning this test can be used) for the duration of the  Covid-19 declaration under Section 564(b)(1) of the Act, 21  U.S.C. section 360bbb-3(b)(1), unless the authorization is  terminated or revoked. Performed at [facility], [address] [place], [place] [address]   Urine culture     Status: None   Collection Time: 11/23/19  7:36 AM   Specimen: Urine, Random  Result Value Ref Range Status   Specimen  Description URINE, RANDOM  Final   Special Requests NONE  Final   Culture   Final    NO GROWTH Performed at [facility], [address] [place], [place] [address]    Report Status 11/24/2019 FINAL  Final  Culture, blood (routine x 2)     Status: None (Preliminary result)   Collection Time: 11/23/19  9:36 AM   Specimen: BLOOD LEFT HAND  Result Value Ref Range Status   Specimen Description BLOOD LEFT HAND  Final   Special Requests   Final    AEROBIC BOTTLE ONLY Blood Culture results may not be optimal due to an inadequate volume of blood received in culture bottles   Culture   Final    NO GROWTH 4 DAYS Performed at [facility], [address], [place], [place] [address]    Report Status PENDING  Incomplete  Culture, blood (routine x 2)     Status: None (Preliminary result)   Collection Time: 11/23/19  9:36 AM   Specimen: BLOOD  Result Value Ref Range Status   Specimen Description BLOOD RIGHT ANTECUBITAL  Final   Special Requests   Final    AEROBIC BOTTLE ONLY Blood Culture results may not be optimal due to an inadequate volume of blood received in culture bottles   Culture   Final    NO GROWTH 4 DAYS Performed at [facility], [address], [place], [place] [address]    Report Status PENDING  Incomplete  Aerobic/Anaerobic Culture (surgical/deep wound)     Status: None (Preliminary result)   Collection Time: 11/23/19  6:09 PM   Specimen: Abscess  Result Value Ref Range Status   Specimen Description ABSCESS  Final   Special Requests NONE  Final   Gram Stain   Final    ABUNDANT WBC PRESENT, PREDOMINANTLY PMN MODERATE GRAM POSITIVE COCCI FEW GRAM POSITIVE RODS Performed at [facility], [address] [place], [place] [address]    Culture   Final    RARE ESCHERICHIA COLI FEW PSEUDOMONAS AERUGINOSA NO ANAEROBES ISOLATED; CULTURE IN PROGRESS FOR 5 DAYS    Report Status PENDING  Incomplete   Organism ID, Bacteria ESCHERICHIA COLI  Final    Organism ID, Bacteria PSEUDOMONAS AERUGINOSA  Final      Susceptibility   Escherichia coli - MIC*    AMPICILLIN >=32 RESISTANT Resistant     CEFAZOLIN <=4 SENSITIVE Sensitive     CEFEPIME <=1 SENSITIVE Sensitive     CEFTAZIDIME <=1 SENSITIVE Sensitive     CEFTRIAXONE <=1 SENSITIVE Sensitive     CIPROFLOXACIN <=0.25 SENSITIVE Sensitive     GENTAMICIN <=1 SENSITIVE Sensitive     IMIPENEM <=0.25 SENSITIVE Sensitive     TRIMETH/SULFA <=20 SENSITIVE Sensitive     AMPICILLIN/SULBACTAM 8 SENSITIVE Sensitive     PIP/TAZO <=4 SENSITIVE Sensitive     * RARE ESCHERICHIA COLI   Pseudomonas aeruginosa - MIC*    CEFTAZIDIME <=1 SENSITIVE Sensitive     CIPROFLOXACIN <=0.25 SENSITIVE  Sensitive     GENTAMICIN <=1 SENSITIVE Sensitive     IMIPENEM 2 SENSITIVE Sensitive     PIP/TAZO 16 SENSITIVE Sensitive     CEFEPIME <=1 SENSITIVE Sensitive     * FEW PSEUDOMONAS AERUGINOSA     Labs: BNP (last 3 results) No results for input(s): BNP in the last 8760 hours. Basic Metabolic Panel: Recent Labs  Lab 11/23/19 0511 11/23/19 1356 11/24/19 0219 11/25/19 0300 11/26/19 0802  NA 121* 131* 130* 132* 132*  K 4.4 3.7 3.5 3.9 3.5  CL 88* 96* 99 98 98  CO2 21* 23 21* 20* 24  GLUCOSE 125* 102* 88 92 111*  BUN <5* <5* <5* <5* <5*  CREATININE 0.54 0.44 0.38* 0.44 0.51  CALCIUM 6.9* 6.3* 6.7* 6.6* 7.0*   Liver Function Tests: Recent Labs  Lab 11/23/19 0511 11/25/19 0300 11/26/19 0802  AST 24 23 18   ALT 22 18 13   ALKPHOS 150* 89 68  BILITOT 0.9 1.3* 0.4  PROT 6.6 5.8* 5.2*  ALBUMIN 3.1* 2.9* 2.5*   Recent Labs  Lab 11/23/19 0655  LIPASE 16   No results for input(s): AMMONIA in the last 168 hours. CBC: Recent Labs  Lab 11/23/19 0511 11/24/19 0219 11/25/19 0300 11/26/19 0802  WBC 23.2* 11.0* 7.5 10.5  NEUTROABS 20.1*  --   --   --   HGB 13.3 12.3 14.5 12.8  HCT 38.7 36.9 43.8 38.7  MCV 91.9 93.4 93.4 94.2  PLT 436* 347 398 188   Cardiac Enzymes: No results for input(s): CKTOTAL,  CKMB, CKMBINDEX, TROPONINI in the last 168 hours. BNP: Invalid input(s): POCBNP CBG: No results for input(s): GLUCAP in the last 168 hours. D-Dimer No results for input(s): DDIMER in the last 72 hours. Hgb A1c No results for input(s): HGBA1C in the last 72 hours. Lipid Profile No results for input(s): CHOL, HDL, LDLCALC, TRIG, CHOLHDL, LDLDIRECT in the last 72 hours. Thyroid function studies No results for input(s): TSH, T4TOTAL, T3FREE, THYROIDAB in the last 72 hours.  Invalid input(s): FREET3 Anemia work up No results for input(s): VITAMINB12, FOLATE, FERRITIN, TIBC, IRON, RETICCTPCT in the last 72 hours. Urinalysis    Component Value Date/Time   COLORURINE YELLOW 11/23/2019 0455   APPEARANCEUR CLOUDY (A) 11/23/2019 0455   LABSPEC 1.004 (L) 11/23/2019 0455   PHURINE 6.0 11/23/2019 0455   GLUCOSEU NEGATIVE 11/23/2019 0455   HGBUR MODERATE (A) 11/23/2019 0455   BILIRUBINUR NEGATIVE 11/23/2019 0455   BILIRUBINUR N 08/19/2017 1604   KETONESUR NEGATIVE 11/23/2019 0455   PROTEINUR NEGATIVE 11/23/2019 0455   UROBILINOGEN 0.2 08/19/2017 1604   UROBILINOGEN 0.2 02/01/2014 0350   NITRITE NEGATIVE 11/23/2019 0455   LEUKOCYTESUR LARGE (A) 11/23/2019 0455   Sepsis Labs Invalid input(s): PROCALCITONIN,  WBC,  LACTICIDVEN Microbiology Recent Results (from the past 240 hour(s))  Respiratory Panel by RT PCR (Flu A&B, Covid) - Nasopharyngeal Swab     Status: None   Collection Time: 11/23/19  6:34 AM   Specimen: Nasopharyngeal Swab  Result Value Ref Range Status   SARS Coronavirus 2 by RT PCR NEGATIVE NEGATIVE Final    Comment: (NOTE) SARS-CoV-2 target nucleic acids are NOT DETECTED. The SARS-CoV-2 RNA is generally detectable in upper respiratoy specimens during the acute phase of infection. The lowest concentration of SARS-CoV-2 viral copies this assay can detect is 131 copies/mL. A negative result does not preclude SARS-Cov-2 infection and should not be used as the sole basis for  treatment or other patient management decisions. A negative result may  occur with  improper specimen collection/handling, submission of specimen other than nasopharyngeal swab, presence of viral mutation(s) within the areas targeted by this assay, and inadequate number of viral copies (<131 copies/mL). A negative result must be combined with clinical observations, patient history, and epidemiological information. The expected result is Negative. Fact Sheet for Patients:  PinkCheek.be Fact Sheet for Healthcare Providers:  GravelBags.it This test is not yet ap proved or cleared by the [place] FDA and  has been authorized for detection and/or diagnosis of SARS-CoV-2 by FDA under an Emergency Use Authorization (EUA). This EUA will remain  in effect (meaning this test can be used) for the duration of the COVID-19 declaration under Section 564(b)(1) of the Act, 21 U.S.C. section 360bbb-3(b)(1), unless the authorization is terminated or revoked sooner.    Influenza A by PCR NEGATIVE NEGATIVE Final   Influenza B by PCR NEGATIVE NEGATIVE Final    Comment: (NOTE) The Xpert Xpress SARS-CoV-2/FLU/RSV assay is intended as an aid in  the diagnosis of influenza from Nasopharyngeal swab specimens and  should not be used as a sole basis for treatment. Nasal washings and  aspirates are unacceptable for Xpert Xpress SARS-CoV-2/FLU/RSV  testing. Fact Sheet for Patients: PinkCheek.be Fact Sheet for Healthcare Providers: GravelBags.it This test is not yet approved or cleared by the [place] FDA and  has been authorized for detection and/or diagnosis of SARS-CoV-2 by  FDA under an Emergency Use Authorization (EUA). This EUA will remain  in effect (meaning this test can be used) for the duration of the  Covid-19 declaration under Section 564(b)(1) of the Act, 21  U.S.C. section  360bbb-3(b)(1), unless the authorization is  terminated or revoked. Performed at [facility], [place] [address], [place], [place] [address]   Urine culture     Status: None   Collection Time: 11/23/19  7:36 AM   Specimen: Urine, Random  Result Value Ref Range Status   Specimen Description URINE, RANDOM  Final   Special Requests NONE  Final   Culture   Final    NO GROWTH Performed at [facility], [place] [address] [place], [place] [address]    Report Status 11/24/2019 FINAL  Final  Culture, blood (routine x 2)     Status: None (Preliminary result)   Collection Time: 11/23/19  9:36 AM   Specimen: BLOOD LEFT HAND  Result Value Ref Range Status   Specimen Description BLOOD LEFT HAND  Final   Special Requests   Final    AEROBIC BOTTLE ONLY Blood Culture results may not be optimal due to an inadequate volume of blood received in culture bottles   Culture   Final    NO GROWTH 4 DAYS Performed at [facility], [place] [address], [place],  [address]    Report Status PENDING  Incomplete  Culture, blood (routine x 2)     Status: None (Preliminary result)   Collection Time: 11/23/19  9:36 AM   Specimen: BLOOD  Result Value Ref Range Status   Specimen Description BLOOD RIGHT ANTECUBITAL  Final   Special Requests   Final    AEROBIC BOTTLE ONLY Blood Culture results may not be optimal due to an inadequate volume of blood received in culture bottles   Culture   Final    NO GROWTH 4 DAYS Performed at [facility], [place] [address] [place],  [address]    Report Status PENDING  Incomplete  Aerobic/Anaerobic Culture (surgical/deep wound)  Status: None (Preliminary result)   Collection Time: 11/23/19  6:09 PM   Specimen: Abscess  Result Value Ref Range Status   Specimen Description ABSCESS  Final   Special Requests NONE  Final   Gram Stain   Final    ABUNDANT WBC PRESENT, PREDOMINANTLY PMN MODERATE GRAM POSITIVE COCCI FEW GRAM POSITIVE RODS Performed at  [facility], [address] [place], [place] [address]    Culture   Final    RARE ESCHERICHIA COLI FEW PSEUDOMONAS AERUGINOSA NO ANAEROBES ISOLATED; CULTURE IN PROGRESS FOR 5 DAYS    Report Status PENDING  Incomplete   Organism ID, Bacteria ESCHERICHIA COLI  Final   Organism ID, Bacteria PSEUDOMONAS AERUGINOSA  Final      Susceptibility   Escherichia coli - MIC*    AMPICILLIN >=32 RESISTANT Resistant     CEFAZOLIN <=4 SENSITIVE Sensitive     CEFEPIME <=1 SENSITIVE Sensitive     CEFTAZIDIME <=1 SENSITIVE Sensitive     CEFTRIAXONE <=1 SENSITIVE Sensitive     CIPROFLOXACIN <=0.25 SENSITIVE Sensitive     GENTAMICIN <=1 SENSITIVE Sensitive     IMIPENEM <=0.25 SENSITIVE Sensitive     TRIMETH/SULFA <=20 SENSITIVE Sensitive     AMPICILLIN/SULBACTAM 8 SENSITIVE Sensitive     PIP/TAZO <=4 SENSITIVE Sensitive     * RARE ESCHERICHIA COLI   Pseudomonas aeruginosa - MIC*    CEFTAZIDIME <=1 SENSITIVE Sensitive     CIPROFLOXACIN <=0.25 SENSITIVE Sensitive     GENTAMICIN <=1 SENSITIVE Sensitive     IMIPENEM 2 SENSITIVE Sensitive     PIP/TAZO 16 SENSITIVE Sensitive     CEFEPIME <=1 SENSITIVE Sensitive     * FEW PSEUDOMONAS AERUGINOSA   Time spent: 30  SIGNED:   Rickey Barbara, MD  [facility] 11/27/2019, 9:34 AM  If 7PM-7AM, please contact night-coverage

## 2019-11-27 NOTE — Plan of Care (Signed)
  Problem: Health Behavior/Discharge Planning: Goal: Ability to manage health-related needs will improve Outcome: Progressing   Problem: Clinical Measurements: Goal: Ability to maintain clinical measurements within normal limits will improve Outcome: Progressing Goal: Will remain free from infection Outcome: Progressing Goal: Diagnostic test results will improve Outcome: Progressing Goal: Respiratory complications will improve Outcome: Progressing Goal: Cardiovascular complication will be avoided Outcome: Progressing   Problem: Activity: Goal: Risk for activity intolerance will decrease Outcome: Progressing   Problem: Nutrition: Goal: Adequate nutrition will be maintained 11/27/2019 0337 by Claude Manges, RN Outcome: Progressing 11/27/2019 0332 by Claude Manges, RN Outcome: Progressing   Problem: Coping: Goal: Level of anxiety will decrease Outcome: Progressing   Problem: Elimination: Goal: Will not experience complications related to bowel motility 11/27/2019 0337 by Claude Manges, RN Outcome: Progressing 11/27/2019 0332 by Claude Manges, RN Outcome: Progressing Goal: Will not experience complications related to urinary retention 11/27/2019 0337 by Claude Manges, RN Outcome: Progressing 11/27/2019 0332 by Claude Manges, RN Outcome: Progressing   Problem: Pain Managment: Goal: General experience of comfort will improve 11/27/2019 0337 by Claude Manges, RN Outcome: Progressing 11/27/2019 0332 by Claude Manges, RN Outcome: Progressing   Problem: Safety: Goal: Ability to remain free from injury will improve 11/27/2019 0337 by Claude Manges, RN Outcome: Progressing 11/27/2019 0332 by Claude Manges, RN Outcome: Progressing   Problem: Skin Integrity: Goal: Risk for impaired skin integrity will decrease 11/27/2019 0337 by Claude Manges, RN Outcome: Progressing 11/27/2019 0332 by Claude Manges, RN Outcome: Progressing

## 2019-11-27 NOTE — Progress Notes (Signed)
Referring Physician(s): Dr Johnna Acosta  Supervising Physician: Malachy Moan  Patient Status:  [facility] - In-pt  Chief Complaint:  Sig diverticular abscess  Subjective:  Drain placed in IR 5/5 Better daily Eating Reg diet Followed by [facility]  Allergies: Sulfa antibiotics, Sulfonamide derivatives, Dilaudid [hydromorphone hcl], Penicillins, and Hydromorphone hcl  Medications: Prior to Admission medications   Medication Sig Start Date End Date Taking? Authorizing Provider  amLODipine (NORVASC) 10 MG tablet TAKE 1 TABLET BY MOUTH EVERY DAY Patient taking differently: Take 10 mg by mouth daily.  10/28/19  Yes Philip Aspen, Limmie Patricia, MD  aspirin 81 MG chewable tablet Chew 1 tablet (81 mg total) by mouth daily. 04/15/19  Yes Ghimire, Werner Lean, MD  denosumab (PROLIA) 60 MG/ML SOSY injection Inject 60 mg into the skin every 6 (six) months.   Yes [provider]  folic acid (FOLVITE) 1 MG tablet Take 1 tablet (1 mg total) by mouth daily. 05/03/19  Yes Philip Aspen, Limmie Patricia, MD  gabapentin (NEURONTIN) 100 MG capsule TAKE 1 CAPSULE BY MOUTH TWICE A DAY Patient taking differently: Take 100 mg by mouth 2 (two) times daily.  10/25/19  Yes Philip Aspen, Limmie Patricia, MD  Multiple Vitamin (MULTIVITAMIN WITH MINERALS) TABS tablet Take 1 tablet by mouth daily.   Yes [provider]  pantoprazole (PROTONIX) 40 MG tablet TAKE 1 TABLET BY MOUTH EVERY DAY Patient taking differently: Take 40 mg by mouth daily.  10/28/19  Yes Philip Aspen, Limmie Patricia, MD  thiamine 100 MG tablet Take 1 tablet (100 mg total) by mouth daily. Patient not taking: Reported on 11/23/2019 06/10/19   Philip Aspen, Limmie Patricia, MD     Vital Signs: BP (!) 152/74   Pulse 64   Temp (!) 97.3 F (36.3 C) (Oral)   Resp 17   Ht 5\' 2"  (1.575 m)   Wt 121 lb 4.1 oz (55 kg)   SpO2 98%   BMI 22.18 kg/m   Physical Exam Skin:    General: Skin is warm and dry.     Comments: Site is clean and dry NT no  bleeding OP 75 cc yesterday Purulent Organism ID, Bacteria ESCHERICHIA COLI  Organism ID, Bacteria PSEUDOMONAS AERUGINOSA        Imaging: CT IMAGE GUIDED FLUID DRAIN BY CATHETER  Result Date: 11/24/2019 CLINICAL DATA:  Diverticular abscess EXAM: CT GUIDED DRAINAGE OF PELVIC ABSCESS ANESTHESIA/SEDATION: Intravenous Fentanyl 01/24/2020 and Versed 2mg  were administered as conscious sedation during continuous monitoring of the patient's level of consciousness and physiological / cardiorespiratory status by the radiology RN, with a total moderate sedation time of 15 minutes. PROCEDURE: The procedure, risks, benefits, and alternatives were explained to the patient. Questions regarding the procedure were encouraged and answered. The patient understands and consents to the procedure. Select axial scans through the lower abdomen and pelvis were obtained. The left pelvic collection was localized and an appropriate skin entry site was determined and marked. The operative field was prepped with chlorhexidinein a sterile fashion, and a sterile drape was applied covering the operative field. A sterile gown and sterile gloves were used for the procedure. Local anesthesia was provided with 1% Lidocaine. Under CT fluoroscopic guidance, percutaneous entry needle advanced into the collection. Position confirmed on CT. Guidewire advanced easily, confirmed on CT. Tract dilated to facilitate placement 12 French pigtail drain catheter, placed within the dependent aspect of the collection. Approximately 50 mL of thin purulent material were aspirated, sent for Gram stain and culture. Catheter secured  externally with 0 Prolene suture and StatLock and placed to gravity drain bag. The patient tolerated the procedure well. COMPLICATIONS: None immediate FINDINGS: Left lower quadrant /pelvic gas and fluid collection was again localized. 12 French pigtail drain catheter placed as above. Sample of the thin purulent aspirate sent for Gram  stain and culture. IMPRESSION: Technically successful CT-guided left pelvic abscess drain catheter placement. Electronically Signed   By: Lucrezia Europe M.D.   On: 11/24/2019 09:41    Labs:  CBC: Recent Labs    11/23/19 0511 11/24/19 0219 11/25/19 0300 11/26/19 0802  WBC 23.2* 11.0* 7.5 10.5  HGB 13.3 12.3 14.5 12.8  HCT 38.7 36.9 43.8 38.7  PLT 436* 347 398 188    COAGS: Recent Labs    04/06/19 0404 11/24/19 0219  INR 1.2 1.1  APTT 40*  --     BMP: Recent Labs    11/23/19 1356 11/24/19 0219 11/25/19 0300 11/26/19 0802  NA 131* 130* 132* 132*  K 3.7 3.5 3.9 3.5  CL 96* 99 98 98  CO2 23 21* 20* 24  GLUCOSE 102* 88 92 111*  BUN <5* <5* <5* <5*  CALCIUM 6.3* 6.7* 6.6* 7.0*  CREATININE 0.44 0.38* 0.44 0.51  GFRNONAA >60 >60 >60 >60  GFRAA >60 >60 >60 >60    LIVER FUNCTION TESTS: Recent Labs    06/03/19 0749 11/23/19 0511 11/25/19 0300 11/26/19 0802  BILITOT 0.6 0.9 1.3* 0.4  AST 21 24 23 18   ALT 28 22 18 13   ALKPHOS 99 150* 89 68  PROT 6.7 6.6 5.8* 5.2*  ALBUMIN 4.4 3.1* 2.9* 2.5*    Assessment and Plan:  LLQ abscess drain Draining well OP purulent- 75 cc yesterday Will follow Order in for OP follow up in [facility] Will need to flush daily at home 5 cc sterile saline Record OP daily  Electronically Signed: Lavonia Drafts, PA-C 11/27/2019, 8:46 AM   I spent a total of 15 Minutes at the the patient's bedside AND on the patient's hospital floor or unit, greater than 50% of which was counseling/coordinating care for LLQ abscess drain

## 2019-11-27 NOTE — Progress Notes (Signed)
Patient confused and impulsive; keeps getting out of bed to go to bathroom. Several attempts made to reorient patient, placed pure wick but patient keeps removing it. Bed alarm set and close to nurses station. Notified MD Jon Billings on call. Ordered to place Recruitment consultant. Will continue to monitor.

## 2019-11-28 ENCOUNTER — Other Ambulatory Visit: Payer: Self-pay | Admitting: General Surgery

## 2019-11-28 DIAGNOSIS — K5720 Diverticulitis of large intestine with perforation and abscess without bleeding: Secondary | ICD-10-CM

## 2019-11-28 LAB — AEROBIC/ANAEROBIC CULTURE W GRAM STAIN (SURGICAL/DEEP WOUND)

## 2019-11-28 LAB — CULTURE, BLOOD (ROUTINE X 2)
Culture: NO GROWTH
Culture: NO GROWTH

## 2019-12-06 ENCOUNTER — Ambulatory Visit
Admission: RE | Admit: 2019-12-06 | Discharge: 2019-12-06 | Disposition: A | Discharge status: Home / Self Care | Payer: PPO | Source: Ambulatory Visit | Attending: General Surgery | Admitting: General Surgery

## 2019-12-06 ENCOUNTER — Ambulatory Visit
Admission: RE | Admit: 2019-12-06 | Discharge: 2019-12-06 | Disposition: A | Discharge status: Home / Self Care | Payer: PPO | Source: Ambulatory Visit | Attending: Radiology | Admitting: Radiology

## 2019-12-06 ENCOUNTER — Encounter: Payer: Self-pay | Admitting: Radiology

## 2019-12-06 ENCOUNTER — Other Ambulatory Visit: Payer: Self-pay | Admitting: General Surgery

## 2019-12-06 DIAGNOSIS — K5720 Diverticulitis of large intestine with perforation and abscess without bleeding: Secondary | ICD-10-CM

## 2019-12-06 DIAGNOSIS — K5721 Diverticulitis of large intestine with perforation and abscess with bleeding: Secondary | ICD-10-CM

## 2019-12-06 DIAGNOSIS — K5730 Diverticulosis of large intestine without perforation or abscess without bleeding: Secondary | ICD-10-CM | POA: Diagnosis not present

## 2019-12-06 HISTORY — PX: IR RADIOLOGIST EVAL & MGMT: IMG5224

## 2019-12-06 MED ORDER — IOPAMIDOL (ISOVUE-300) INJECTION 61%
100.0000 mL | Freq: Once | INTRAVENOUS | Status: AC | PRN
  Administered 2019-12-06: 100 mL via INTRAVENOUS

## 2019-12-06 NOTE — Progress Notes (Signed)
Chief Complaint: Patient was seen today for follow up LLQ abdominal abscess drain follow up  Referring Physician(s): [name]  Supervising Physician: Richarda Overlie  History of Present Illness: [name] is a 69 y.o. female with past medical history significant for anxiety, insomnia, ETOH abuse (in remission), HTN, anemia, duodenal ulcer with previous perforation and diverticulosis who presented to the ED on 11/23/19 with complaints of abdominal pain and poor appetite. She was found to have a large, complex multiloculated diverticular abscess adjacent to the sigmoid colon - a LLQ drain was placed in IR that same day. She was seen by general surgery who noted possible colovesical fistula, however she did not require surgical intervention at that time. She was discharged to home on 5/9 with 14 days of PO antibiotics as well as planned follow up with surgery. She present to IR today for routine drain follow up.  Of note, [name] was found to have a large diverticular abscess on 04/05/19 after being brought to the ED due to complaints of abdominal pain. A drain was placed by IR at that time and she was followed as an outpatient until 06/07/19 when the drain became partially withdrawn from the skin and was non-functional so it was removed. CT scan during the previous visit showed resolution of the abscess however the drain was left in place due to concern for fistula.  [name] denies any complaints today, but she is concerned she will need to go back to the hospital because "it always happens that way." Her husband flushes the drain everyday with 5 mL of NS, when asked how much comes out she states "this much" and indicates with her fingers what appears to be about 10 mL. She is still taking antibiotics. She has not had any fevers, chills, abdominal pain, nausea or vomiting. She has been voiding as usual and is not sure if there has been any purulent material in her urine. Her appetite has  been good and she has been having regular bowel movements. She is not sure if she has follow up appointments with GI or general surgery, but tells me that her husband has all of her appointments written down at home.   Past Medical History:  Diagnosis Date  . Alcoholism (HCC) 02/02/2009  . ANXIETY 01/21/2007  . Blood transfusion    hx of 2005   . DIVERTICULOSIS 07/09/2007  . Duodenal ulcer hemorrhage perforated 2006  . Headache(784.0) 05/31/2007   pt denies at 08/12/11 preop visit   . HYPERTENSION 06/23/2007  . HYPONATREMIA 02/02/2009   better off diuretic  . INSOMNIA 05/31/2007  . Iron deficiency anemia    pt denies at 08/12/11 visit   . NSAID-induced duodenal ulcer   . OSTEOPOROSIS 01/21/2007  . Raynaud's syndrome 06/23/2007  . Recurrent upper respiratory infection (URI)    pt reports slight cold using Nyquil prn at bedtime   . VITAMIN B12 DEFICIENCY 10/02/2009  . Zoster 2012   twice - left groin/vagina    Past Surgical History:  Procedure Laterality Date  . COLONOSCOPY  06/18/2007   angulated and stenotic sigmoid colon with severe sigmoid diverticulosis  . EXPLORATORY LAPAROTOMY  08/03/2004   1) duodenal ulcer oversew, pyloroplasty, anterior, posterior and truncal vagotomies  . EXPLORATORY LAPAROTOMY  10/20/2004   1) duodenal ulcer oversew  with exclusion2) side-side gastrojejunostomy 3) feeding jejunostomy  . IR RADIOLOGIST EVAL & MGMT  04/26/2019  . IR RADIOLOGIST EVAL & MGMT  05/10/2019  . IR RADIOLOGIST EVAL &  MGMT  06/07/2019  . IR SINUS/FIST TUBE CHK-NON GI  04/12/2019  . LAPAROSCOPY  08/14/2011   Procedure: LAPAROSCOPY DIAGNOSTIC;  Surgeon: Kandis Cockingavid H Newman, MD;  Location: [facility];  Service: General;  Laterality: N/A;  . LAPAROTOMY  08/14/2011   Procedure: EXPLORATORY LAPAROTOMY;  Surgeon: Kandis Cockingavid H Newman, MD;  Location: [facility];  Service: General;  Laterality: N/A;  exploratory laparotomy, lysis of adhesions, revision gastrojejunostomy, upper endoscopy  . OPEN REDUCTION INTERNAL  FIXATION (ORIF) DISTAL RADIAL FRACTURE Right 05/01/2016   Procedure: OPEN REDUCTION INTERNAL FIXATION (ORIF) DISTAL RADIAL FRACTURE, right;  Surgeon: Betha LoaKevin Kuzma, MD;  Location: [facility];  Service: Orthopedics;  Laterality: Right;  . TUBAL LIGATION  1977  . UPPER GASTROINTESTINAL ENDOSCOPY  09/16/2007; 12/21/2008; 03/17/2011   2009: Normal 2010: Normal with enteroscopy2012: Gastrojejunal  anastomotic ulcer, gastric retention    Allergies: Sulfa antibiotics, Sulfonamide derivatives, Dilaudid [hydromorphone hcl], Penicillins, and Hydromorphone hcl  Medications: Prior to Admission medications   Medication Sig Start Date End Date Taking? Authorizing Provider  amLODipine (NORVASC) 10 MG tablet TAKE 1 TABLET BY MOUTH EVERY DAY Patient taking differently: Take 10 mg by mouth daily.  10/28/19   Philip AspenHernandez Acosta, Limmie PatriciaEstela Y, MD  amoxicillin-clavulanate (AUGMENTIN) 875-125 MG tablet Take 1 tablet by mouth 2 (two) times daily for 11 days. 11/27/19 12/08/19  Jerald Kiefhiu, Stephen K, MD  denosumab (PROLIA) 60 MG/ML SOSY injection Inject 60 mg into the skin every 6 (six) months.    [provider]  folic acid (FOLVITE) 1 MG tablet Take 1 tablet (1 mg total) by mouth daily. 05/03/19   Philip AspenHernandez Acosta, Limmie PatriciaEstela Y, MD  gabapentin (NEURONTIN) 100 MG capsule TAKE 1 CAPSULE BY MOUTH TWICE A DAY Patient taking differently: Take 100 mg by mouth 2 (two) times daily.  10/25/19   Philip AspenHernandez Acosta, Limmie PatriciaEstela Y, MD  Multiple Vitamin (MULTIVITAMIN WITH MINERALS) TABS tablet Take 1 tablet by mouth daily.    [provider]  pantoprazole (PROTONIX) 40 MG tablet TAKE 1 TABLET BY MOUTH EVERY DAY Patient taking differently: Take 40 mg by mouth daily.  10/28/19   Philip AspenHernandez Acosta, Limmie PatriciaEstela Y, MD  sodium chloride flush (NS) 0.9 % SOLN 5 mLs by Intracatheter route daily. 11/27/19   Maczis, Elmer SowMichael M, PA-C  thiamine 100 MG tablet Take 1 tablet (100 mg total) by mouth daily. Patient not taking: Reported on 11/23/2019 06/10/19    Philip AspenHernandez Acosta, Limmie PatriciaEstela Y, MD     Family History  Problem Relation Age of Onset  . Stroke Father     Social History   Socioeconomic History  . Marital status: Married    Spouse name: Not on file  . Number of children: 1  . Years of education: Not on file  . Highest education level: Not on file  Occupational History  . Occupation: retired    Associate Professormployer: UNEMPLOYED  Tobacco Use  . Smoking status: Never Smoker  . Smokeless tobacco: Never Used  Substance and Sexual Activity  . Alcohol use: Yes    Alcohol/week: 7.0 standard drinks    Types: 7 Cans of beer per week    Comment: 12 oz  . Drug use: No  . Sexual activity: Not on file  Other Topics Concern  . Not on file  Social History Narrative   Married retired 1 child   Beer drinker 1 a day, no tobacco no drug use   Social Determinants of Corporate investment bankerHealth   Financial Resource Strain:   . Difficulty of Paying Living Expenses:  Food Insecurity:   . Worried About Programme researcher, broadcasting/film/video in the Last Year:   . Barista in the Last Year:   Transportation Needs:   . Freight forwarder (Medical):   Marland Kitchen Lack of Transportation (Non-Medical):   Physical Activity:   . Days of Exercise per Week:   . Minutes of Exercise per Session:   Stress:   . Feeling of Stress :   Social Connections:   . Frequency of Communication with Friends and Family:   . Frequency of Social Gatherings with Friends and Family:   . Attends Religious Services:   . Active Member of Clubs or Organizations:   . Attends Banker Meetings:   Marland Kitchen Marital Status:     Review of Systems: A 12 point ROS discussed and pertinent positives are indicated in the HPI above.  All other systems are negative.  Review of Systems  Constitutional: Negative for chills and fever.  Respiratory: Negative for shortness of breath.   Cardiovascular: Negative for chest pain.  Gastrointestinal: Negative for abdominal pain, blood in stool, constipation, diarrhea, nausea and  vomiting.  Musculoskeletal: Negative for back pain.    Vital Signs: BP (!) 142/57   Pulse (!) 54   Temp 98.6 F (37 C)   SpO2 100%   Physical Exam Vitals reviewed.  Constitutional:      General: She is not in acute distress. HENT:     Head: Normocephalic.  Cardiovascular:     Rate and Rhythm: Normal rate.  Pulmonary:     Effort: Pulmonary effort is normal.  Abdominal:     General: There is no distension.     Palpations: Abdomen is soft.     Tenderness: There is no abdominal tenderness.     Comments: (+) LLQ drain to gravity with ~20 cc tan purulent material in bag. Inferior portion of stat lock is detached from skin. Suture in place. Some crusting around insertion site but no obvious drainage or bleeding. Contrast injection does not cause discomfort or leakage today.  Skin:    General: Skin is warm and dry.  Neurological:     Mental Status: She is alert. Mental status is at baseline.     Imaging: CT ABDOMEN PELVIS W CONTRAST  Result Date: 11/23/2019 CLINICAL DATA:  Frequent urination, poor appetite EXAM: CT ABDOMEN AND PELVIS WITH CONTRAST TECHNIQUE: Multidetector CT imaging of the abdomen and pelvis was performed using the standard protocol following bolus administration of intravenous contrast. CONTRAST:  OMNIPAQUE IOHEXOL 300 MG/ML  SOLN COMPARISON:  04/12/2019 FINDINGS: Lower chest: No acute abnormality. Hepatobiliary: Unchanged 1.0 cm low-density lesion at the right hepatic dome, likely cyst or hemangioma. No new focal liver lesions. There may be a trace amount of layering sludge or small stones within the gallbladder lumen. No inflammatory changes are seen. No biliary dilatation. Pancreas: Unremarkable. No pancreatic ductal dilatation or surrounding inflammatory changes. Spleen: Normal in size without focal abnormality. Adrenals/Urinary Tract: Unremarkable adrenal glands. Unchanged round low-density subcentimeter focus in the left kidney, too small to definitively  characterize, but most likely represents cyst. Kidneys are otherwise unremarkable. No renal stone or hydronephrosis. There is thickening of the left wall of the urinary bladder adjacent to inflammatory change in the left hemipelvis. No intraluminal air within the bladder. Stomach/Bowel: Extensive sigmoid diverticulosis with long segment wall thickening, pericolonic inflammatory changes and free fluid. Large, complex multiloculated diverticular abscess adjacent to the sigmoid colon. Collection is difficult to accurately measure as it insinuates  along the course of the inflamed sigmoid colon. The more inferior aspect of the collection is located within the posterior cul-de-sac and extends along the course of the inflamed sigmoid colon and abuts the left lateral aspect of the urinary bladder measuring up to 14 cm in maximal dimension (series 7, image 99; series 3, images 58-73). No bowel obstruction. 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 Vascular/Lymphatic: Aortoiliac atherosclerotic calcification without aneurysm. No lymphadenopathy. Reproductive: Loss of fat plane between the uterus and diverticular collection. Other: No large volume of free air. Musculoskeletal: No acute osseous findings. IMPRESSION: 1. Extensive sigmoid diverticulitis. Large, complex multiloculated diverticular abscess adjacent to the sigmoid colon measuring up to 14 cm in maximal dimension. 2. Thickening of the left wall of the urinary bladder adjacent to the inflammatory change in the left hemipelvis. No intraluminal air within the bladder to suggest fistula. These results were called by telephone at the time of interpretation on 11/23/2019 at 8:18 am to provider Dr Renaye Rakers, who verbally acknowledged these results. Electronically Signed   By: Duanne Guess D.O.   On: 11/23/2019 08:21   CT IMAGE GUIDED FLUID DRAIN BY CATHETER  Result Date: 11/24/2019 CLINICAL DATA:  Diverticular abscess EXAM: CT GUIDED DRAINAGE OF  PELVIC ABSCESS ANESTHESIA/SEDATION: Intravenous Fentanyl and Versed 2mg  were administered as conscious sedation during continuous monitoring of the patient's level of consciousness and physiological / cardiorespiratory status by the radiology RN, with a total moderate sedation time of 15 minutes. PROCEDURE: The procedure, risks, benefits, and alternatives were explained to the patient. Questions regarding the procedure were encouraged and answered. The patient understands and consents to the procedure. Select axial scans through the lower abdomen and pelvis were obtained. The left pelvic collection was localized and an appropriate skin entry site was determined and marked. The operative field was prepped with chlorhexidinein a sterile fashion, and a sterile drape was applied covering the operative field. A sterile gown and sterile gloves were used for the procedure. Local anesthesia was provided with 1% Lidocaine. Under CT fluoroscopic guidance, percutaneous entry needle advanced into the collection. Position confirmed on CT. Guidewire advanced easily, confirmed on CT. Tract dilated to facilitate placement 12 French pigtail drain catheter, placed within the dependent aspect of the collection. Approximately 50 mL of thin purulent material were aspirated, sent for Gram stain and culture. Catheter secured externally with 0 Prolene suture and StatLock and placed to gravity drain bag. The patient tolerated the procedure well. COMPLICATIONS: None immediate FINDINGS: Left lower quadrant /pelvic gas and fluid collection was again localized. 12 French pigtail drain catheter placed as above. Sample of the thin purulent aspirate sent for Gram stain and culture. IMPRESSION: Technically successful CT-guided left pelvic abscess drain catheter placement. Electronically Signed   By: M.D.   On: 11/24/2019 09:41    Labs:  CBC: Recent Labs    11/23/19 0511 11/24/19 0219 11/25/19 0300 11/26/19 0802  WBC  23.2* 11.0* 7.5 SPECIMEN CLOTTED  HGB 13.3 12.3 14.5 SPECIMEN CLOTTED  HCT 38.7 36.9 43.8 SPECIMEN CLOTTED  PLT 436* 347 398 SPECIMEN CLOTTED    COAGS: Recent Labs    04/06/19 0404 11/24/19 0219  INR 1.2 1.1  APTT 40*  --     BMP: Recent Labs    11/23/19 1356 11/24/19 0219 11/25/19 0300 11/26/19 0802  NA 131* 130* 132* 132*  K 3.7 3.5 3.9 3.5  CL 96* 99 98 98  CO2 23 21* 20* 24  GLUCOSE 102* 88 92 111*  BUN <5* <5* <5* <5*  CALCIUM 6.3* 6.7* 6.6* 7.0*  CREATININE 0.44 0.38* 0.44 0.51  GFRNONAA >60 >60 >60 >60  GFRAA >60 >60 >60 >60    LIVER FUNCTION TESTS: Recent Labs    06/03/19 0749 11/23/19 0511 11/25/19 0300 11/26/19 0802  BILITOT 0.6 0.9 1.3* 0.4  AST 21 24 23 18   ALT 28 22 18 13   ALKPHOS 99 150* 89 68  PROT 6.7 6.6 5.8* 5.2*  ALBUMIN 4.4 3.1* 2.9* 2.5*    TUMOR MARKERS: No results for input(s): AFPTM, CEA, CA199, CHROMGRNA in the last 8760 hours.  Assessment:  69 y/o F with history of recurrent diverticular abscess s/p drain placement in IR on 04/05/19 with subsequent removal 06/07/19 and placement of new LLQ drain on 11/23/19 who presents today for follow up CT scan and drain injection.  Per patient her husband is mostly caring for drain and has been flushing it with 5 mL of NS QD, she estimates the output to be about 10 mL QD but does not have an exact figure. She has no complaints today and the drain has been working well as far as she is aware. She is unclear about when her other follow up appointments are but states her husband keeps track of them. Per d/c summary she was instructed to scheduled an appointment with general surgery about 3 week post d/c.  CT A/P with contrast today shows significant improvement in previously seen abscess with drain in appropriate position which was also confirmed with drain injection today. Drain to remain in place at this time - she will follow up with our office in about 2 weeks for repeat imaging/possible injection.  She was given oral and written instructions today to continue to flush the drain QD with 5 mL NS and to record the output QD on drain card which she will need to bring to her follow up appointment. She was encouraged to ensure she has a follow up appointment with general surgery and GI. She states understanding of all the above and is in agreement with this plan.   Electronically Signed: Joaquim Nam PA-C 12/06/2019, 1:01 PM   Please refer to Dr. Moises Blood attestation of this note for management and plan.

## 2019-12-12 ENCOUNTER — Other Ambulatory Visit: Payer: Self-pay

## 2019-12-13 ENCOUNTER — Encounter: Payer: Self-pay | Admitting: Internal Medicine

## 2019-12-13 ENCOUNTER — Ambulatory Visit (INDEPENDENT_AMBULATORY_CARE_PROVIDER_SITE_OTHER): Payer: PPO | Admitting: Internal Medicine

## 2019-12-13 VITALS — BP 140/80 | HR 57 | Temp 98.4°F | Wt 114.7 lb

## 2019-12-13 DIAGNOSIS — E871 Hypo-osmolality and hyponatremia: Secondary | ICD-10-CM

## 2019-12-13 DIAGNOSIS — N3001 Acute cystitis with hematuria: Secondary | ICD-10-CM | POA: Diagnosis not present

## 2019-12-13 DIAGNOSIS — I10 Essential (primary) hypertension: Secondary | ICD-10-CM | POA: Diagnosis not present

## 2019-12-13 DIAGNOSIS — G9341 Metabolic encephalopathy: Secondary | ICD-10-CM

## 2019-12-13 DIAGNOSIS — E559 Vitamin D deficiency, unspecified: Secondary | ICD-10-CM | POA: Diagnosis not present

## 2019-12-13 DIAGNOSIS — K5720 Diverticulitis of large intestine with perforation and abscess without bleeding: Secondary | ICD-10-CM | POA: Diagnosis not present

## 2019-12-13 DIAGNOSIS — Z09 Encounter for follow-up examination after completed treatment for conditions other than malignant neoplasm: Secondary | ICD-10-CM

## 2019-12-13 LAB — CBC WITH DIFFERENTIAL/PLATELET
Basophils Absolute: 0.1 10*3/uL (ref 0.0–0.1)
Basophils Relative: 1.4 % (ref 0.0–3.0)
Eosinophils Absolute: 0.1 10*3/uL (ref 0.0–0.7)
Eosinophils Relative: 2.2 % (ref 0.0–5.0)
HCT: 39.8 % (ref 36.0–46.0)
Hemoglobin: 13.5 g/dL (ref 12.0–15.0)
Lymphocytes Relative: 32.7 % (ref 12.0–46.0)
Lymphs Abs: 1.9 10*3/uL (ref 0.7–4.0)
MCHC: 33.8 g/dL (ref 30.0–36.0)
MCV: 93.2 fl (ref 78.0–100.0)
Monocytes Absolute: 0.5 10*3/uL (ref 0.1–1.0)
Monocytes Relative: 8 % (ref 3.0–12.0)
Neutro Abs: 3.2 10*3/uL (ref 1.4–7.7)
Neutrophils Relative %: 55.7 % (ref 43.0–77.0)
Platelets: 346 10*3/uL (ref 150.0–400.0)
RBC: 4.28 Mil/uL (ref 3.87–5.11)
RDW: 14.3 % (ref 11.5–15.5)
WBC: 5.8 10*3/uL (ref 4.0–10.5)

## 2019-12-13 LAB — BASIC METABOLIC PANEL
BUN: 7 mg/dL (ref 6–23)
CO2: 29 mEq/L (ref 19–32)
Calcium: 8.9 mg/dL (ref 8.4–10.5)
Chloride: 93 mEq/L — ABNORMAL LOW (ref 96–112)
Creatinine, Ser: 0.49 mg/dL (ref 0.40–1.20)
GFR: 125.67 mL/min (ref >60.00)
Glucose, Bld: 94 mg/dL (ref 70–99)
Potassium: 4.9 mEq/L (ref 3.5–5.1)
Sodium: 128 mEq/L — ABNORMAL LOW (ref 135–145)

## 2019-12-13 LAB — VITAMIN D 25 HYDROXY (VIT D DEFICIENCY, FRACTURES): VITD: 31.43 ng/mL (ref 30.00–100.00)

## 2019-12-13 NOTE — Patient Instructions (Signed)
-  Nice seeing you today!!  -Lab work today; will notify you once results are available.  -See you back in 4 months for your physical. Please come in fasting that day.

## 2019-12-13 NOTE — Progress Notes (Signed)
Established Patient Office Visit     This visit occurred during the SARS-CoV-2 public health emergency.  Safety protocols were in place, including screening questions prior to the visit, additional usage of staff PPE, and extensive cleaning of exam room while observing appropriate contact time as indicated for disinfecting solutions.    CC/Reason for Visit: Hospital follow-up  HPI: [name] is a 69 y.o. female who is coming in today for the above mentioned reasons she was hospitalized from 5/5-5/9 after presenting with confusion and abdominal pain.  She was found to have sigmoid diverticulitis with a 14 cm intra-abdominal abscess.  She was seen by both general surgery and IR during her hospitalization.  She had a drain placed.  During that hospitalization she was also found to have a UTI, hyponatremia and acute toxic metabolic encephalopathy.  She was discharged with 14 days of Augmentin which she has now completed.  She already had follow-up with IR and drain was left in place.  Her follow-up with surgery is in 2 weeks to consider partial colectomy.  She has been doing well, no abdominal pain, she feels stronger.  Her appetite is good.  Her husband has been draining the abscess every day, today he got 2 mm.  As an aside, has been discontinued amlodipine as she was having falls.  No falls since discontinuing amlodipine.   Past Medical/Surgical History: Past Medical History:  Diagnosis Date  . Alcoholism (HCC) 02/02/2009  . ANXIETY 01/21/2007  . Blood transfusion    hx of 2005   . DIVERTICULOSIS 07/09/2007  . Duodenal ulcer hemorrhage perforated 2006  . Headache(784.0) 05/31/2007   pt denies at 08/12/11 preop visit   . HYPERTENSION 06/23/2007  . HYPONATREMIA 02/02/2009   better off diuretic  . INSOMNIA 05/31/2007  . Iron deficiency anemia    pt denies at 08/12/11 visit   . NSAID-induced duodenal ulcer   . OSTEOPOROSIS 01/21/2007  . Raynaud's syndrome 06/23/2007  . Recurrent  upper respiratory infection (URI)    pt reports slight cold using Nyquil prn at bedtime   . VITAMIN B12 DEFICIENCY 10/02/2009  . Zoster 2012   twice - left groin/vagina    Past Surgical History:  Procedure Laterality Date  . COLONOSCOPY  06/18/2007   angulated and stenotic sigmoid colon with severe sigmoid diverticulosis  . EXPLORATORY LAPAROTOMY  08/03/2004   1) duodenal ulcer oversew, pyloroplasty, anterior, posterior and truncal vagotomies  . EXPLORATORY LAPAROTOMY  10/20/2004   1) duodenal ulcer oversew  with exclusion2) side-side gastrojejunostomy 3) feeding jejunostomy  . IR RADIOLOGIST EVAL & MGMT  04/26/2019  . IR RADIOLOGIST EVAL & MGMT  05/10/2019  . IR RADIOLOGIST EVAL & MGMT  06/07/2019  . IR RADIOLOGIST EVAL & MGMT  12/06/2019  . IR SINUS/FIST TUBE CHK-NON GI  04/12/2019  . LAPAROSCOPY  08/14/2011   Procedure: LAPAROSCOPY DIAGNOSTIC;  Surgeon: Kandis Cocking, MD;  Location: [facility];  Service: General;  Laterality: N/A;  . LAPAROTOMY  08/14/2011   Procedure: EXPLORATORY LAPAROTOMY;  Surgeon: Kandis Cocking, MD;  Location: [facility];  Service: General;  Laterality: N/A;  exploratory laparotomy, lysis of adhesions, revision gastrojejunostomy, upper endoscopy  . OPEN REDUCTION INTERNAL FIXATION (ORIF) DISTAL RADIAL FRACTURE Right 05/01/2016   Procedure: OPEN REDUCTION INTERNAL FIXATION (ORIF) DISTAL RADIAL FRACTURE, right;  Surgeon: Betha Loa, MD;  Location: [facility];  Service: Orthopedics;  Laterality: Right;  . TUBAL LIGATION  1977  . UPPER GASTROINTESTINAL ENDOSCOPY  09/16/2007; 12/21/2008;  03/17/2011   2009: Normal 2010: Normal with enteroscopy2012: Gastrojejunal  anastomotic ulcer, gastric retention    Social History:  reports that she has never smoked. She has never used smokeless tobacco. She reports current alcohol use of about 7.0 standard drinks of alcohol per week. She reports that she does not use drugs.  Allergies: Allergies  Allergen Reactions  . Sulfa  Antibiotics Anaphylaxis and Rash  . Sulfonamide Derivatives Anaphylaxis and Rash  . Dilaudid [Hydromorphone Hcl] Hives  . Penicillins Other (See Comments)    Has patient had a PCN reaction causing immediate rash, facial/tongue/throat swelling, SOB or lightheadedness with hypotension: No Has patient had a PCN reaction causing severe rash involving mucus membranes or skin necrosis: No Has patient had a PCN reaction that required hospitalization No Has patient had a PCN reaction occurring within the last 10 years: No If all of the above answers are "NO", then may proceed with Cephalosporin use.  Marland Kitchen Hydromorphone Hcl Rash    Family History:  Family History  Problem Relation Age of Onset  . Stroke Father      Current Outpatient Medications:  .  denosumab (PROLIA) 60 MG/ML SOSY injection, Inject 60 mg into the skin every 6 (six) months., Disp: , Rfl:  .  folic acid (FOLVITE) 1 MG tablet, Take 1 tablet (1 mg total) by mouth daily., Disp: 30 tablet, Rfl: 11 .  gabapentin (NEURONTIN) 100 MG capsule, TAKE 1 CAPSULE BY MOUTH TWICE A DAY (Patient taking differently: Take 100 mg by mouth 2 (two) times daily. ), Disp: 180 capsule, Rfl: 1 .  Multiple Vitamin (MULTIVITAMIN WITH MINERALS) TABS tablet, Take 1 tablet by mouth daily., Disp: , Rfl:  .  pantoprazole (PROTONIX) 40 MG tablet, TAKE 1 TABLET BY MOUTH EVERY DAY (Patient taking differently: Take 40 mg by mouth daily. ), Disp: 90 tablet, Rfl: 1 .  sodium chloride flush (NS) 0.9 % SOLN, 5 mLs by Intracatheter route daily., Disp: 50 mL, Rfl: 1 .  thiamine 100 MG tablet, Take 1 tablet (100 mg total) by mouth daily., Disp: 30 tablet, Rfl: 2 .  amLODipine (NORVASC) 10 MG tablet, TAKE 1 TABLET BY MOUTH EVERY DAY (Patient not taking: No sig reported), Disp: 90 tablet, Rfl: 1  Review of Systems:  Constitutional: Denies fever, chills, diaphoresis, appetite change and fatigue.  HEENT: Denies photophobia, eye pain, redness, hearing loss, ear pain, congestion,  sore throat, rhinorrhea, sneezing, mouth sores, trouble swallowing, neck pain, neck stiffness and tinnitus.   Respiratory: Denies SOB, DOE, cough, chest tightness,  and wheezing.   Cardiovascular: Denies chest pain, palpitations and leg swelling.  Gastrointestinal: Denies nausea, vomiting, abdominal pain, diarrhea, constipation, blood in stool and abdominal distention.  Genitourinary: Denies dysuria, urgency, frequency, hematuria, flank pain and difficulty urinating.  Endocrine: Denies: hot or cold intolerance, sweats, changes in hair or nails, polyuria, polydipsia. Musculoskeletal: Denies myalgias, back pain, joint swelling, arthralgias and gait problem.  Skin: Denies pallor, rash and wound.  Neurological: Denies dizziness, seizures, syncope, weakness, light-headedness, numbness and headaches.  Hematological: Denies adenopathy. Easy bruising, personal or family bleeding history  Psychiatric/Behavioral: Denies suicidal ideation, mood changes, confusion, nervousness, sleep disturbance and agitation    Physical Exam: Vitals:   12/13/19 1325  BP: 140/80  Pulse: (!) 57  Temp: 98.4 F (36.9 C)  TempSrc: Temporal  SpO2: 97%  Weight: 114 lb 11.2 oz (52 kg)    Body mass index is 20.98 kg/m.   Constitutional: NAD, calm, comfortable Eyes: PERRL, lids and conjunctivae normal ENMT:  Mucous membranes are moist.  Respiratory: clear to auscultation bilaterally, no wheezing, no crackles. Normal respiratory effort. No accessory muscle use.  Cardiovascular: Regular rate and rhythm, no murmurs / rubs / gallops. No extremity edema.  Abdomen: no tenderness, no masses palpated. No hepatosplenomegaly. Bowel sounds positive.  Drain in place. Neurologic: Grossly intact and nonfocal Psychiatric: Normal judgment and insight. Alert and oriented x 3. Normal mood.    Impression and Plan:  Hospital discharge follow-up Diverticulitis of large intestine with abscess without bleeding  Abscess of sigmoid  colon due to diverticulitis Hyponatremia  Acute cystitis with hematuria Metabolic encephalopathy  -She has completed antibiotic course, has followed up with IR, has follow-up with general surgery coming up. -She is eating well without pain. -Will order basic metabolic profile and CBC that were recommended by discharging hospitalist.  Benign essential hypertension -As stated above, she is now off amlodipine due to frequent falls. -Have advised patient and husband to have a chart with ambulatory measurements and report back to me in 3 months to see if we need to start a different agent.    Patient Instructions  -Nice seeing you today!!  -Lab work today; will notify you once results are available.  -See you back in 4 months for your physical. Please come in fasting that day.     [name], MD [facility]  \

## 2019-12-20 ENCOUNTER — Other Ambulatory Visit: Payer: PPO

## 2019-12-22 ENCOUNTER — Ambulatory Visit (INDEPENDENT_AMBULATORY_CARE_PROVIDER_SITE_OTHER): Payer: PPO | Admitting: Internal Medicine

## 2019-12-22 ENCOUNTER — Ambulatory Visit
Admission: RE | Admit: 2019-12-22 | Discharge: 2019-12-22 | Disposition: A | Discharge status: Home / Self Care | Payer: PPO | Source: Ambulatory Visit | Attending: General Surgery | Admitting: General Surgery

## 2019-12-22 ENCOUNTER — Encounter: Payer: Self-pay | Admitting: Internal Medicine

## 2019-12-22 ENCOUNTER — Other Ambulatory Visit: Payer: Self-pay | Admitting: General Surgery

## 2019-12-22 VITALS — BP 120/54 | HR 56 | Ht 63.0 in | Wt 114.0 lb

## 2019-12-22 DIAGNOSIS — K5721 Diverticulitis of large intestine with perforation and abscess with bleeding: Secondary | ICD-10-CM

## 2019-12-22 DIAGNOSIS — K5720 Diverticulitis of large intestine with perforation and abscess without bleeding: Secondary | ICD-10-CM | POA: Diagnosis not present

## 2019-12-22 DIAGNOSIS — K5780 Diverticulitis of intestine, part unspecified, with perforation and abscess without bleeding: Secondary | ICD-10-CM | POA: Diagnosis not present

## 2019-12-22 HISTORY — PX: IR RADIOLOGIST EVAL & MGMT: IMG5224

## 2019-12-22 NOTE — Progress Notes (Signed)
[name] 70 y.o. 1950-12-13 [id number]  Assessment & Plan:   Encounter Diagnosis  Name Primary?  . Diverticulitis of large intestine with abscess without bleeding Yes    I suspect she will need a preoperative colonoscopy.  I will standby and wait to be informed about this.  I think it would make sense to use the ultraslim scope at the hospital as she is likely to have significant luminal stenosis from her diverticular disease and I want to maximize our chance of completing the procedure.  CC: Isaac Bliss, Rayford Halsted, MD Dr. Leighton Ruff   Subjective:   Chief Complaint: Diverticulitis HPI [name] is here with her husband, she was hospitalized again with diverticulitis and abscess and had it treated with antibiotics and drainage.  She has a colonic fistula still based upon imaging.  She is due to see Dr. Marcello Moores soon.  There is a plan for surgery.  She is definitely much better.  Last colonoscopy was in 2008 and she had a redundant colon with significant sigmoid stenosis even then. Allergies  Allergen Reactions  . Sulfa Antibiotics Anaphylaxis and Rash  . Sulfonamide Derivatives Anaphylaxis and Rash  . Dilaudid [Hydromorphone Hcl] Hives  . Penicillins Other (See Comments)    Has patient had a PCN reaction causing immediate rash, facial/tongue/throat swelling, SOB or lightheadedness with hypotension: No Has patient had a PCN reaction causing severe rash involving mucus membranes or skin necrosis: No Has patient had a PCN reaction that required hospitalization No Has patient had a PCN reaction occurring within the last 10 years: No If all of the above answers are "NO", then may proceed with Cephalosporin use.  Marland Kitchen Hydromorphone Hcl Rash   Current Meds  Medication Sig  . denosumab (PROLIA) 60 MG/ML SOSY injection Inject 60 mg into the skin every 6 (six) months.  . folic acid (FOLVITE) 1 MG tablet Take 1 tablet (1 mg total) by mouth daily.  Marland Kitchen gabapentin (NEURONTIN) 100  MG capsule TAKE 1 CAPSULE BY MOUTH TWICE A DAY (Patient taking differently: Take 100 mg by mouth 2 (two) times daily. )  . Multiple Vitamin (MULTIVITAMIN WITH MINERALS) TABS tablet Take 1 tablet by mouth daily.  . pantoprazole (PROTONIX) 40 MG tablet TAKE 1 TABLET BY MOUTH EVERY DAY (Patient taking differently: Take 40 mg by mouth daily. )  . sodium chloride flush (NS) 0.9 % SOLN 5 mLs by Intracatheter route daily.  Marland Kitchen thiamine 100 MG tablet Take 1 tablet (100 mg total) by mouth daily.   Past Medical History:  Diagnosis Date  . Alcoholism ([place]) 02/02/2009  . ANXIETY 01/21/2007  . Blood transfusion    hx of 2005   . Diverticulitis   . Diverticulitis of large intestine with abscess 04/05/2019  . DIVERTICULOSIS 07/09/2007  . Duodenal ulcer hemorrhage perforated 2006  . Headache(784.0) 05/31/2007   pt denies at 08/12/11 preop visit   . HYPERTENSION 06/23/2007  . HYPONATREMIA 02/02/2009   better off diuretic  . INSOMNIA 05/31/2007  . Iron deficiency anemia    pt denies at 08/12/11 visit   . Marginal ulcer, at gastrojejunostomy, resected 08/14/2011. 09/19/2011  . NSAID-induced duodenal ulcer   . OSTEOPOROSIS 01/21/2007  . Raynaud's syndrome 06/23/2007  . Recurrent upper respiratory infection (URI)    pt reports slight cold using Nyquil prn at bedtime   . Urinary tract infection 11/23/2019  . VITAMIN B12 DEFICIENCY 10/02/2009  . Zoster 2012   twice - left groin/vagina   Past Surgical History:  Procedure Laterality  Date  . COLONOSCOPY  06/18/2007   angulated and stenotic sigmoid colon with severe sigmoid diverticulosis  . EXPLORATORY LAPAROTOMY  08/03/2004   1) duodenal ulcer oversew, pyloroplasty, anterior, posterior and truncal vagotomies  . EXPLORATORY LAPAROTOMY  10/20/2004   1) duodenal ulcer oversew  with exclusion2) side-side gastrojejunostomy 3) feeding jejunostomy  . IR RADIOLOGIST EVAL & MGMT  04/26/2019  . IR RADIOLOGIST EVAL & MGMT  05/10/2019  . IR RADIOLOGIST EVAL & MGMT  06/07/2019  .  IR RADIOLOGIST EVAL & MGMT  12/06/2019  . IR SINUS/FIST TUBE CHK-NON GI  04/12/2019  . LAPAROSCOPY  08/14/2011   Procedure: LAPAROSCOPY DIAGNOSTIC;  Surgeon: Kandis Cocking, MD;  Location: [facility];  Service: General;  Laterality: N/A;  . LAPAROTOMY  08/14/2011   Procedure: EXPLORATORY LAPAROTOMY;  Surgeon: Kandis Cocking, MD;  Location: [facility];  Service: General;  Laterality: N/A;  exploratory laparotomy, lysis of adhesions, revision gastrojejunostomy, upper endoscopy  . OPEN REDUCTION INTERNAL FIXATION (ORIF) DISTAL RADIAL FRACTURE Right 05/01/2016   Procedure: OPEN REDUCTION INTERNAL FIXATION (ORIF) DISTAL RADIAL FRACTURE, right;  Surgeon: Betha Loa, MD;  Location: [facility];  Service: Orthopedics;  Laterality: Right;  . TUBAL LIGATION  1977  . UPPER GASTROINTESTINAL ENDOSCOPY  09/16/2007; 12/21/2008; 03/17/2011   2009: Normal 2010: Normal with enteroscopy2012: Gastrojejunal  anastomotic ulcer, gastric retention   Social History   Social History Narrative   Married retired 1 child   Beer drinker 1 a day, no tobacco no drug use   family history includes Stroke in her father.   Review of Systems As above  Objective:   Physical Exam BP (!) 120/54   Pulse (!) 56   Ht 5\' 3"  (1.6 m)   Wt 114 lb (51.7 kg)   BMI 20.19 kg/m  NAD  Drain in suprapubic area

## 2019-12-22 NOTE — Progress Notes (Signed)
Referring Physician(s): Thomas,Alicia  Chief Complaint: [name] is seen in follow up today s/p left lower quadrant diverticular abscess drain.   History of present illness:  [name] is a 69 y.o. female with past medical history significant for anxiety, insomnia, ETOH abuse (in remission), HTN, anemia, duodenal ulcer with previous perforation and diverticulosis who presented to [place] ED on 11/23/19 with complaints of abdominal pain and poor appetite. [name] was found to have a large, complex multiloculated diverticular abscess adjacent to Kirsten sigmoid colon. A LLQ drain was placed in IR that same day. [name] was seen by general surgery who noted possible colovesical fistula, however [name] did not require surgical intervention at that time. [name] was discharged to home on 5/9 with 14 days of PO antibiotics as well as planned follow up with surgery.    Of note, [name] was found to have a large diverticular abscess on 04/05/19 after being brought to [facility] ED due to complaints of abdominal pain. A drain was placed by IR at that time and [name] was followed as an outpatient until 06/07/19 when [place]irsten drain became partially withdrawn from Kirsten skin and was non-functional so it was removed.  [name] was seen in [facility] clinic 12/06/2019 and [name] CT imaging demonstrated a small residual collection in Kirsten left posterior pelvis.  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   [name] presents to [facility] clinic today for routine drain follow up. [name] denies pain, fever, chills, nausea, vomiting and diarrhea. [name] is having regular bowel movements. 4/10 pain around drain insertion site during palpation. [name] states [name] is eating, drinking, and walking outside daily with [name] husband.   Past Medical History:  Diagnosis Date   Alcoholism (HCC) 02/02/2009   ANXIETY 01/21/2007   Blood transfusion    hx of  2005    Diverticulitis    Diverticulitis of large intestine with abscess 04/05/2019   DIVERTICULOSIS 07/09/2007   Duodenal ulcer hemorrhage perforated 2006   Headache(784.0) 05/31/2007   pt denies at 08/12/11 preop visit    HYPERTENSION 06/23/2007   HYPONATREMIA 02/02/2009   better off diuretic   INSOMNIA 05/31/2007   Iron deficiency anemia    pt denies at 08/12/11 visit    Marginal ulcer, at gastrojejunostomy, resected 08/14/2011. 09/19/2011   NSAID-induced duodenal ulcer    OSTEOPOROSIS 01/21/2007   Raynaud's syndrome 06/23/2007   Recurrent upper respiratory infection (URI)    pt reports slight cold using Nyquil prn at bedtime    Urinary tract infection 11/23/2019   VITAMIN B12 DEFICIENCY 10/02/2009   Zoster 2012   twice - left groin/vagina    Past Surgical History:  Procedure Laterality Date   COLONOSCOPY  06/18/2007   angulated and stenotic sigmoid colon with severe sigmoid diverticulosis   EXPLORATORY LAPAROTOMY  08/03/2004   1) duodenal ulcer oversew, pyloroplasty, anterior, posterior and truncal vagotomies   EXPLORATORY LAPAROTOMY  10/20/2004   1) duodenal ulcer oversew  with exclusion2) side-side gastrojejunostomy 3) feeding jejunostomy   IR RADIOLOGIST EVAL & MGMT  04/26/2019   IR RADIOLOGIST EVAL & MGMT  05/10/2019   IR RADIOLOGIST EVAL & MGMT  06/07/2019   IR RADIOLOGIST EVAL & MGMT  12/06/2019   IR RADIOLOGIST EVAL & MGMT  12/22/2019   IR SINUS/FIST TUBE CHK-NON GI  04/12/2019   LAPAROSCOPY  08/14/2011   Procedure: LAPAROSCOPY DIAGNOSTIC;  Surgeon:  Shann Medal, MD;  Location: [facility];  Service: General;  Laterality: N/A;   LAPAROTOMY  08/14/2011   Procedure: EXPLORATORY LAPAROTOMY;  Surgeon: Shann Medal, MD;  Location: [facility];  Service: General;  Laterality: N/A;  exploratory laparotomy, lysis of adhesions, revision gastrojejunostomy, upper endoscopy   OPEN REDUCTION INTERNAL FIXATION (ORIF) DISTAL RADIAL FRACTURE Right 05/01/2016   Procedure: OPEN  REDUCTION INTERNAL FIXATION (ORIF) DISTAL RADIAL FRACTURE, right;  Surgeon: Leanora Cover, MD;  Location: [facility];  Service: Orthopedics;  Laterality: Right;   TUBAL LIGATION  1977   UPPER GASTROINTESTINAL ENDOSCOPY  09/16/2007; 12/21/2008; 03/17/2011   2009: Normal 2010: Normal with enteroscopy2012: Gastrojejunal  anastomotic ulcer, gastric retention    Allergies: Sulfa antibiotics, Sulfonamide derivatives, Dilaudid [hydromorphone hcl], Penicillins, and Hydromorphone hcl  Medications: Prior to Admission medications   Medication Sig Start Date End Date Taking? Authorizing Provider  denosumab (PROLIA) 60 MG/ML SOSY injection Inject 60 mg into Kirsten skin every 6 (six) months.    [provider]  folic acid (FOLVITE) 1 MG tablet Take 1 tablet (1 mg total) by mouth daily. 05/03/19   Isaac Bliss, Rayford Halsted, MD  gabapentin (NEURONTIN) 100 MG capsule TAKE 1 CAPSULE BY MOUTH TWICE A DAY Rivera taking differently: Take 100 mg by mouth 2 (two) times daily.  10/25/19   Isaac Bliss, Rayford Halsted, MD  Multiple Vitamin (MULTIVITAMIN WITH MINERALS) TABS tablet Take 1 tablet by mouth daily.    [provider]  pantoprazole (PROTONIX) 40 MG tablet TAKE 1 TABLET BY MOUTH EVERY DAY Rivera taking differently: Take 40 mg by mouth daily.  10/28/19   Isaac Bliss, Rayford Halsted, MD  sodium chloride flush (NS) 0.9 % SOLN 5 mLs by Intracatheter route daily. 11/27/19   Maczis, Barth Kirks, PA-C  thiamine 100 MG tablet Take 1 tablet (100 mg total) by mouth daily. 06/10/19   Erline Hau, MD     Family History  Problem Relation Age of Onset   Stroke Father    Colon cancer Neg Hx    Esophageal cancer Neg Hx    Pancreatic cancer Neg Hx    Liver disease Neg Hx     Social History   Socioeconomic History   Marital status: Married    Spouse name: Not on file   Number of children: 1   Years of education: Not on file   Highest education level: Not on file    Occupational History   Occupation: retired    Fish farm manager: UNEMPLOYED  Tobacco Use   Smoking status: Former Smoker   Smokeless tobacco: Never Used  Substance and Sexual Activity   Alcohol use: Not Currently    Alcohol/week: 7.0 standard drinks    Types: 7 Cans of beer per week    Comment: stopped around 2020   Drug use: No   Sexual activity: Not on file  Other Topics Concern   Not on file  Social History Narrative   Married retired 1 child   [place] drinker 1 a day, no tobacco no drug use   Social Determinants of Radio broadcast assistant Strain:    Difficulty of Paying Living Expenses:   Food Insecurity:    Worried About Charity fundraiser in [place] Last Year:    Arboriculturist in [place] Last Year:   Transportation Needs:    Film/video editor (Medical):    Lack of Transportation (Non-Medical):   Physical Activity:    Days of Exercise  per Week:    Minutes of Exercise per Session:   Stress:    Feeling of Stress :   Social Connections:    Frequency of Communication with Friends and Family:    Frequency of Social Gatherings with Friends and Family:    Attends Religious Services:    Active Member of Clubs or Organizations:    Attends Engineer, structural:    Marital Status:      Vital Signs: BP (!) 168/71    Pulse 60    Temp 97.9 F (36.6 C)    SpO2 95%   Physical Exam Constitutional:      General: [name] is not in acute distress. Pulmonary:     Effort: Pulmonary effort is normal.  Abdominal:     Palpations: Abdomen is soft.     Tenderness: There is abdominal tenderness.     Comments: LLQ, around drain insertion site. 4/10 pain per Rivera.   Musculoskeletal:        General: Normal range of motion.  Skin:    General: Skin is warm and dry.  Neurological:     Mental Status: [name] is alert and oriented to person, place, and time.     Imaging: IR Radiologist Eval & Mgmt  Result Date: 12/22/2019 Please refer to notes tab for details  about interventional procedure. (Op Note)   Labs:  CBC: Recent Labs    11/24/19 0219 11/25/19 0300 11/26/19 0802 12/13/19 1359  WBC 11.0* 7.5 SPECIMEN CLOTTED 5.8  HGB 12.3 14.5 SPECIMEN CLOTTED 13.5  HCT 36.9 43.8 SPECIMEN CLOTTED 39.8  PLT 347 398 SPECIMEN CLOTTED 346.0    COAGS: Recent Labs    04/06/19 0404 11/24/19 0219  INR 1.2 1.1  APTT 40*  --     BMP: Recent Labs    11/23/19 1356 11/23/19 1356 11/24/19 0219 11/25/19 0300 11/26/19 0802 12/13/19 1359  NA 131*   < > 130* 132* 132* 128*  K 3.7   < > 3.5 3.9 3.5 4.9  CL 96*   < > 99 98 98 93*  CO2 23   < > 21* 20* 24 29  GLUCOSE 102*   < > 88 92 111* 94  BUN <5*   < > <5* <5* <5* 7  CALCIUM 6.3*   < > 6.7* 6.6* 7.0* 8.9  CREATININE 0.44   < > 0.38* 0.44 0.51 0.49  GFRNONAA >60  --  >60 >60 >60  --   GFRAA >60  --  >60 >60 >60  --    < > = values in this interval not displayed.    LIVER FUNCTION TESTS: Recent Labs    06/03/19 0749 11/23/19 0511 11/25/19 0300 11/26/19 0802  BILITOT 0.6 0.9 1.3* 0.4  AST 21 24 23 18   ALT 28 22 18 13   ALKPHOS 99 150* 89 68  PROT 6.7 6.6 5.8* 5.2*  ALBUMIN 4.4 3.1* 2.9* 2.5*    Assessment:  [name] presented to [facility] today for a follow-up on [name] LLQ diverticular abscess drain. Today's drain injection still shows a colonic fistula, per Dr. . A decision was made to keep Kirsten drain in place and [name] will return to [facility] clinic in 2 weeks. [name] was instructed to stop flushing [place] drain. These instructions were also shared with [name]'s husband. [name] and [name] husband were in agreement with this plan.   [name] has a follow up with surgery next week.    Signed: Jason Fila  Hillery Hunter, NP 12/22/2019, 1:21 PM   Please refer to Dr. Chester Holstein attestation of this note for management and plan.

## 2019-12-22 NOTE — Patient Instructions (Signed)
We will await to hear from Dr Maisie Fus about the need for a colonoscopy.   I appreciate the opportunity to care for you. Stan Head, MD, [facility]

## 2019-12-29 ENCOUNTER — Telehealth: Payer: Self-pay | Admitting: Internal Medicine

## 2019-12-29 DIAGNOSIS — K5792 Diverticulitis of intestine, part unspecified, without perforation or abscess without bleeding: Secondary | ICD-10-CM | POA: Diagnosis not present

## 2019-12-29 NOTE — Telephone Encounter (Signed)
Please advise on location of colonoscopy

## 2019-12-29 NOTE — Telephone Encounter (Signed)
[facility] called to schedule colon but it needs to be at the hospital please call the patient's husband

## 2019-12-30 NOTE — Telephone Encounter (Signed)
Colonoscopy at [facility] with ULTRASLIM scope  Try 6/23 1 or 130 ish  Or 6/30

## 2019-12-30 NOTE — Telephone Encounter (Signed)
6/28 0730?

## 2019-12-30 NOTE — Telephone Encounter (Signed)
No that is the [place] block time, already  MD with cases

## 2019-12-30 NOTE — Telephone Encounter (Signed)
Endo can't accomodate 6/23 (you already have a case starting at 1:45).  Not enough time, can't do 6/30.  Please provide alternate dates

## 2020-01-02 NOTE — Telephone Encounter (Signed)
I am still looking but will add Dr. Christella Hartigan into the loop to see if he has any availability in his blocks in next few weeks to do her case

## 2020-01-03 ENCOUNTER — Other Ambulatory Visit: Payer: Self-pay

## 2020-01-03 DIAGNOSIS — K5720 Diverticulitis of large intestine with perforation and abscess without bleeding: Secondary | ICD-10-CM

## 2020-01-03 NOTE — Telephone Encounter (Signed)
I am happy to help. Can do this for her on Thursday July 8th.  Thanks

## 2020-01-03 NOTE — Telephone Encounter (Signed)
Patient's son notified of the appt with Dr. Christella Hartigan for colonoscopy at [facility] on 01/26/20.  She will go for COVID screen on 01/24/20 and pre-visit 01/09/20.    Alexia Freestone, [place]

## 2020-01-03 NOTE — Telephone Encounter (Signed)
That is great.  You can tell Ms. Jason Fila Dr. Christella Hartigan does my colonoscopies.

## 2020-01-04 ENCOUNTER — Other Ambulatory Visit: Payer: PPO

## 2020-01-09 ENCOUNTER — Other Ambulatory Visit: Payer: Self-pay

## 2020-01-09 ENCOUNTER — Ambulatory Visit (AMBULATORY_SURGERY_CENTER): Payer: Self-pay | Admitting: *Deleted

## 2020-01-09 VITALS — Ht 63.0 in | Wt 112.0 lb

## 2020-01-09 DIAGNOSIS — K5720 Diverticulitis of large intestine with perforation and abscess without bleeding: Secondary | ICD-10-CM

## 2020-01-09 DIAGNOSIS — Z01818 Encounter for other preprocedural examination: Secondary | ICD-10-CM

## 2020-01-09 MED ORDER — PLENVU 140 G PO SOLR: 1.0000 | ORAL | 0 refills | Status: DC

## 2020-01-09 NOTE — Progress Notes (Signed)
Comp covid vacc x 2   Husband in Pv with pt today- both aware of covid 19 restrictions and masking required   No egg or soy allergy known to patient  No issues with past sedation with any surgeries  or procedures, no intubation problems  No diet pills per patient No home 02 use per patient  No blood thinners per patient  Pt denies issues with constipation  No A fib or A flutter  EMMI video sent to pt's e mail   Due to the COVID-19 pandemic we are asking patients to follow these guidelines. Please only bring one care partner. Please be aware that your care partner may wait in the car in the parking lot or if they feel like they will be too hot to wait in the car, they may wait in the lobby on the 4th floor. All care partners are required to wear a mask the entire time (we do not have any that we can provide them), they need to practice social distancing, and we will do a Covid check for all patient's and care partners when you arrive. Also we will check their temperature and your temperature. If the care partner waits in their car they need to stay in the parking lot the entire time and we will call them on their cell phone when the patient is ready for discharge so they can bring the car to the front of the building. Also all patient's will need to wear a mask into building.

## 2020-01-10 ENCOUNTER — Ambulatory Visit
Admission: RE | Admit: 2020-01-10 | Discharge: 2020-01-10 | Disposition: A | Discharge status: Home / Self Care | Payer: PPO | Source: Ambulatory Visit | Attending: General Surgery | Admitting: General Surgery

## 2020-01-10 ENCOUNTER — Other Ambulatory Visit: Payer: Self-pay | Admitting: General Surgery

## 2020-01-10 DIAGNOSIS — K5721 Diverticulitis of large intestine with perforation and abscess with bleeding: Secondary | ICD-10-CM

## 2020-01-10 DIAGNOSIS — T859XXA Unspecified complication of internal prosthetic device, implant and graft, initial encounter: Secondary | ICD-10-CM | POA: Diagnosis not present

## 2020-01-10 DIAGNOSIS — K5720 Diverticulitis of large intestine with perforation and abscess without bleeding: Secondary | ICD-10-CM | POA: Diagnosis not present

## 2020-01-10 DIAGNOSIS — K632 Fistula of intestine: Secondary | ICD-10-CM | POA: Diagnosis not present

## 2020-01-10 HISTORY — PX: IR RADIOLOGIST EVAL & MGMT: IMG5224

## 2020-01-10 NOTE — H&P (View-Only) (Signed)
Chief Complaint: Patient was seen in consultation today for drain follow-up.  Referring Physician(s): Thomas,Alicia  History of Present Illness: [name] is a 69 y.o. female with history of a diverticular abscess and status post percutaneous drain placement on 11/23/2019.  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  Patient is feeling well and only complaint is discomfort at the drain site.  Patient's husband has been doing a very good job of taking care of the drain and recording the output.  There has been minimal output from the drain since the last injection.  Reporting only a few mls of output each day.  Patient and husband are not flushing the drain anymore.  She denies fevers, chills or abdominal pain.  She is eating normally and having normal bowel movements.  Past Medical History:  Diagnosis Date  . Alcoholism ([place]) 02/02/2009  . ANXIETY 01/21/2007  . Blood transfusion    hx of 2005   . Diverticulitis   . Diverticulitis of large intestine with abscess 04/05/2019  . DIVERTICULOSIS 07/09/2007  . Duodenal ulcer hemorrhage perforated 2006  . GERD (gastroesophageal reflux disease)   . Headache(784.0) 05/31/2007   pt denies at 08/12/11 preop visit   . HYPERTENSION 06/23/2007  . HYPONATREMIA 02/02/2009   better off diuretic  . INSOMNIA 05/31/2007  . Iron deficiency anemia    pt denies at 08/12/11 visit   . Marginal ulcer, at gastrojejunostomy, resected 08/14/2011. 09/19/2011  . NSAID-induced duodenal ulcer   . OSTEOPOROSIS 01/21/2007  . Raynaud's syndrome 06/23/2007  . Recurrent upper respiratory infection (URI)    pt reports slight cold using Nyquil prn at bedtime   . Urinary tract infection 11/23/2019  . VITAMIN B12 DEFICIENCY 10/02/2009  . Zoster 2012   twice - left groin/vagina    Past Surgical History:  Procedure Laterality Date  . COLONOSCOPY  06/18/2007   angulated and  stenotic sigmoid colon with severe sigmoid diverticulosis  . EXPLORATORY LAPAROTOMY  08/03/2004   1) duodenal ulcer oversew, pyloroplasty, anterior, posterior and truncal vagotomies  . EXPLORATORY LAPAROTOMY  10/20/2004   1) duodenal ulcer oversew  with exclusion2) side-side gastrojejunostomy 3) feeding jejunostomy  . IR RADIOLOGIST EVAL & MGMT  04/26/2019  . IR RADIOLOGIST EVAL & MGMT  05/10/2019  . IR RADIOLOGIST EVAL & MGMT  06/07/2019  . IR RADIOLOGIST EVAL & MGMT  12/06/2019  . IR RADIOLOGIST EVAL & MGMT  12/22/2019  . IR RADIOLOGIST EVAL & MGMT  01/10/2020  . IR SINUS/FIST TUBE CHK-NON GI  04/12/2019  . LAPAROSCOPY  08/14/2011   Procedure: LAPAROSCOPY DIAGNOSTIC;  Surgeon: Shann Medal, MD;  Location: [facility];  Service: General;  Laterality: N/A;  . LAPAROTOMY  08/14/2011   Procedure: EXPLORATORY LAPAROTOMY;  Surgeon: Shann Medal, MD;  Location: [facility];  Service: General;  Laterality: N/A;  exploratory laparotomy, lysis of adhesions, revision gastrojejunostomy, upper endoscopy  . OPEN REDUCTION INTERNAL FIXATION (ORIF) DISTAL RADIAL FRACTURE Right 05/01/2016   Procedure: OPEN REDUCTION INTERNAL FIXATION (ORIF) DISTAL RADIAL FRACTURE, right;  Surgeon: Leanora Cover, MD;  Location: [facility];  Service: Orthopedics;  Laterality: Right;  . TUBAL LIGATION  1977  . UPPER GASTROINTESTINAL ENDOSCOPY  09/16/2007; 12/21/2008; 03/17/2011   2009: Normal 2010: Normal with enteroscopy2012: Gastrojejunal  anastomotic ulcer, gastric retention    Allergies: Sulfa antibiotics, Sulfonamide derivatives, Dilaudid [hydromorphone hcl], Penicillins, and Hydromorphone hcl  Medications: Prior to Admission medications  Medication Sig Start Date End Date Taking? Authorizing Provider  calcium carbonate (OS-CAL - DOSED IN MG OF ELEMENTAL CALCIUM) 1250 (500 Ca) MG tablet Take 1 tablet by mouth.    [provider]  denosumab (PROLIA) 60 MG/ML SOSY injection Inject 60 mg into the skin every 6 (six)  months.    [provider]  folic acid (FOLVITE) 1 MG tablet Take 1 tablet (1 mg total) by mouth daily. 05/03/19   Isaac Bliss, Rayford Halsted, MD  gabapentin (NEURONTIN) 100 MG capsule TAKE 1 CAPSULE BY MOUTH TWICE A DAY Patient taking differently: Take 100 mg by mouth 2 (two) times daily.  10/25/19   Isaac Bliss, Rayford Halsted, MD  Multiple Vitamin (MULTIVITAMIN WITH MINERALS) TABS tablet Take 1 tablet by mouth daily.    [provider]  pantoprazole (PROTONIX) 40 MG tablet TAKE 1 TABLET BY MOUTH EVERY DAY Patient taking differently: Take 40 mg by mouth daily.  10/28/19   Isaac Bliss, Rayford Halsted, MD  PEG-KCl-NaCl-NaSulf-Na Asc-C (PLENVU) 140 g SOLR Take 1 kit by mouth as directed. 01/09/20   Milus Banister, MD  sodium chloride flush (NS) 0.9 % SOLN 5 mLs by Intracatheter route daily. Patient not taking: Reported on 01/09/2020 11/27/19   Maczis, Barth Kirks, PA-C  thiamine 100 MG tablet Take 1 tablet (100 mg total) by mouth daily. Patient taking differently: Take 100 mg by mouth daily. B 1 06/10/19   Isaac Bliss, Rayford Halsted, MD     Family History  Problem Relation Age of Onset  . Stroke Father   . Colon cancer Neg Hx   . Esophageal cancer Neg Hx   . Pancreatic cancer Neg Hx   . Liver disease Neg Hx   . Colon polyps Neg Hx     Social History   Socioeconomic History  . Marital status: Married    Spouse name: Not on file  . Number of children: 1  . Years of education: Not on file  . Highest education level: Not on file  Occupational History  . Occupation: retired    Fish farm manager: UNEMPLOYED  Tobacco Use  . Smoking status: Former Research scientist (life sciences)  . Smokeless tobacco: Never Used  Vaping Use  . Vaping Use: Never assessed  Substance and Sexual Activity  . Alcohol use: Not Currently    Alcohol/week: 7.0 standard drinks    Types: 7 Cans of beer per week    Comment: stopped around 2020  . Drug use: No  . Sexual activity: Not on file  Other Topics Concern  . Not on file    Social History Narrative   Married retired 1 child   Beer drinker 1 a day, no tobacco no drug use   Social Determinants of Radio broadcast assistant Strain:   . Difficulty of Paying Living Expenses:   Food Insecurity:   . Worried About Charity fundraiser in the Last Year:   . Arboriculturist in the Last Year:   Transportation Needs:   . Film/video editor (Medical):   Marland Kitchen Lack of Transportation (Non-Medical):   Physical Activity:   . Days of Exercise per Week:   . Minutes of Exercise per Session:   Stress:   . Feeling of Stress :   Social Connections:   . Frequency of Communication with Friends and Family:   . Frequency of Social Gatherings with Friends and Family:   . Attends Religious Services:   . Active Member of Clubs or Organizations:   .  Attends Archivist Meetings:   Marland Kitchen Marital Status:      Review of Systems  Constitutional: Negative for chills and fatigue.  Gastrointestinal: Negative.  Negative for abdominal pain.    Vital Signs: BP (!) 153/63   Pulse (!) 56   Temp (!) 97.4 F (36.3 C)   SpO2 98%   Physical Exam Vitals reviewed.  Constitutional:      Appearance: She is not ill-appearing.  Abdominal:     General: Abdomen is flat. There is no distension.     Palpations: Abdomen is soft.     Tenderness: There is no abdominal tenderness.     Comments: Percutaneous drain in the anterior left lower abdomen/pelvis.  No drainage or redness around the drain.  Suture is still intact.  Neurological:     Mental Status: She is alert.        Imaging: DG Sinus/Fist Tube Chk-Non GI  Result Date: 12/22/2019 INDICATION: Diverticular abscess status post percutaneous drain. Outpatient follow-up. EXAM: Fluoroscopic injection diverticular abscess drain MEDICATIONS: None. ANESTHESIA/SEDATION: None. COMPLICATIONS: None immediate. PROCEDURE: Informed written consent was obtained from the patient after a thorough discussion of the procedural risks, benefits  and alternatives. All questions were addressed. Maximal Sterile Barrier Technique was utilized including caps, mask, sterile gowns, sterile gloves, sterile drape, hand hygiene and skin antiseptic. A timeout was performed prior to the initiation of the procedure. Comparison made to 12/06/2019. Contrast injection performed. Residual fistula to the adjacent sigmoid colon remains patent. Small residual collapsed abscess cavity noted medial to the drain catheter site. Overall stable appearance. 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 Electronically Signed   By: Jerilynn Mages.  Shick M.D.   On: 12/22/2019 14:19   IR Radiologist Eval & Mgmt  Result Date: 01/10/2020 Please refer to notes tab for details about interventional procedure. (Op Note)  IR Radiologist Eval & Mgmt  Result Date: 12/22/2019 Please refer to notes tab for details about interventional procedure. (Op Note)   Labs:  CBC: Recent Labs    11/24/19 0219 11/25/19 0300 11/26/19 0802 12/13/19 1359  WBC 11.0* 7.5 SPECIMEN CLOTTED 5.8  HGB 12.3 14.5 SPECIMEN CLOTTED 13.5  HCT 36.9 43.8 SPECIMEN CLOTTED 39.8  PLT 347 398 SPECIMEN CLOTTED 346.0    COAGS: Recent Labs    04/06/19 0404 11/24/19 0219  INR 1.2 1.1  APTT 40*  --     BMP: Recent Labs    11/23/19 1356 11/23/19 1356 11/24/19 0219 11/25/19 0300 11/26/19 0802 12/13/19 1359  NA 131*   < > 130* 132* 132* 128*  K 3.7   < > 3.5 3.9 3.5 4.9  CL 96*   < > 99 98 98 93*  CO2 23   < > 21* 20* 24 29  GLUCOSE 102*   < > 88 92 111* 94  BUN <5*   < > <5* <5* <5* 7  CALCIUM 6.3*   < > 6.7* 6.6* 7.0* 8.9  CREATININE 0.44   < > 0.38* 0.44 0.51 0.49  GFRNONAA >60  --  >60 >60 >60  --   GFRAA >60  --  >60 >60 >60  --    < > = values in this interval not displayed.    LIVER FUNCTION TESTS: Recent Labs    06/03/19 0749 11/23/19 0511 11/25/19 0300 11/26/19 0802  BILITOT 0.6 0.9 1.3* 0.4  AST '21 24 23 18  '$ ALT '28 22 18 13  '$ ALKPHOS 99 150* 89 68  PROT 6.7 6.6  5.8*  5.2*  ALBUMIN 4.4 3.1* 2.9* 2.5*    TUMOR MARKERS: No results for input(s): AFPTM, CEA, CA199, CHROMGRNA in the last 8760 hours.  Assessment and Plan:  69 year old with history of diverticular abscess and status post percutaneous drain placement.  Patient is feeling well and reports minimal output from the drain.  Fluid coming out of the drain is a cloudy red/gray material.  Appearance of the output is compatible with residual abscess and/or bowel fistula.  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  There continues to be small residual abscess cavity along the medial and caudal aspect of the drain which has not significantly changed.  I discussed these findings with the patient and husband.  Patient is scheduled to undergo colonoscopy in approximately 2 weeks.  I suggest that we continue the conservative management with keeping the drain to gravity bag and no flushes.  Small residual collection medial to the drain is probably of minimal consequence and it would be difficult to reposition the drain into this area without losing percutaneous access.  We will plan for follow-up drain injection after the colonoscopy.  Patient and family are comfortable with this plan.  They will continue to monitor the output.  Thank you for this interesting consult.  I greatly enjoyed meeting Kingsley Chrissie Dacquisto and look forward to participating in their care.  A copy of this report was sent to the requesting provider on this date.  Electronically Signed: Burman Riis 01/10/2020, 10:03 AM   I spent a total of    10 Minutes in face to face in clinical consultation, greater than 50% of which was counseling/coordinating care for abscess drain care.  Patient ID: [name], female   DOB: 02-Sep-1950, 69 y.o.   MRN: [id number]

## 2020-01-10 NOTE — Progress Notes (Signed)
Chief Complaint: Patient was seen in consultation today for drain follow-up.  Referring Physician(s): Thomas,Alicia  History of Present Illness: Fawna Kaylanni Ezelle is a 69 y.o. female with history of a diverticular abscess and status post percutaneous drain placement on 11/23/2019.  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  Patient is feeling well and only complaint is discomfort at the drain site.  Patient's husband has been doing a very good job of taking care of the drain and recording the output.  There has been minimal output from the drain since the last injection.  Reporting only a few mls of output each day.  Patient and husband are not flushing the drain anymore.  She denies fevers, chills or abdominal pain.  She is eating normally and having normal bowel movements.  Past Medical History:  Diagnosis Date  . Alcoholism ([place]) 02/02/2009  . ANXIETY 01/21/2007  . Blood transfusion    hx of 2005   . Diverticulitis   . Diverticulitis of large intestine with abscess 04/05/2019  . DIVERTICULOSIS 07/09/2007  . Duodenal ulcer hemorrhage perforated 2006  . GERD (gastroesophageal reflux disease)   . Headache(784.0) 05/31/2007   pt denies at 08/12/11 preop visit   . HYPERTENSION 06/23/2007  . HYPONATREMIA 02/02/2009   better off diuretic  . INSOMNIA 05/31/2007  . Iron deficiency anemia    pt denies at 08/12/11 visit   . Marginal ulcer, at gastrojejunostomy, resected 08/14/2011. 09/19/2011  . NSAID-induced duodenal ulcer   . OSTEOPOROSIS 01/21/2007  . Raynaud's syndrome 06/23/2007  . Recurrent upper respiratory infection (URI)    pt reports slight cold using Nyquil prn at bedtime   . Urinary tract infection 11/23/2019  . VITAMIN B12 DEFICIENCY 10/02/2009  . Zoster 2012   twice - left groin/vagina    Past Surgical History:  Procedure Laterality Date  . COLONOSCOPY  06/18/2007   angulated and  stenotic sigmoid colon with severe sigmoid diverticulosis  . EXPLORATORY LAPAROTOMY  08/03/2004   1) duodenal ulcer oversew, pyloroplasty, anterior, posterior and truncal vagotomies  . EXPLORATORY LAPAROTOMY  10/20/2004   1) duodenal ulcer oversew  with exclusion2) side-side gastrojejunostomy 3) feeding jejunostomy  . IR RADIOLOGIST EVAL & MGMT  04/26/2019  . IR RADIOLOGIST EVAL & MGMT  05/10/2019  . IR RADIOLOGIST EVAL & MGMT  06/07/2019  . IR RADIOLOGIST EVAL & MGMT  12/06/2019  . IR RADIOLOGIST EVAL & MGMT  12/22/2019  . IR RADIOLOGIST EVAL & MGMT  01/10/2020  . IR SINUS/FIST TUBE CHK-NON GI  04/12/2019  . LAPAROSCOPY  08/14/2011   Procedure: LAPAROSCOPY DIAGNOSTIC;  Surgeon: Shann Medal, MD;  Location: [facility];  Service: General;  Laterality: N/A;  . LAPAROTOMY  08/14/2011   Procedure: EXPLORATORY LAPAROTOMY;  Surgeon: Shann Medal, MD;  Location: [facility];  Service: General;  Laterality: N/A;  exploratory laparotomy, lysis of adhesions, revision gastrojejunostomy, upper endoscopy  . OPEN REDUCTION INTERNAL FIXATION (ORIF) DISTAL RADIAL FRACTURE Right 05/01/2016   Procedure: OPEN REDUCTION INTERNAL FIXATION (ORIF) DISTAL RADIAL FRACTURE, right;  Surgeon: Leanora Cover, MD;  Location: [facility];  Service: Orthopedics;  Laterality: Right;  . TUBAL LIGATION  1977  . UPPER GASTROINTESTINAL ENDOSCOPY  09/16/2007; 12/21/2008; 03/17/2011   2009: Normal 2010: Normal with enteroscopy2012: Gastrojejunal  anastomotic ulcer, gastric retention    Allergies: Sulfa antibiotics, Sulfonamide derivatives, Dilaudid [hydromorphone hcl], Penicillins, and Hydromorphone hcl  Medications: Prior to Admission medications  Medication Sig Start Date End Date Taking? Authorizing Provider  calcium carbonate (OS-CAL - DOSED IN MG OF ELEMENTAL CALCIUM) 1250 (500 Ca) MG tablet Take 1 tablet by mouth.    [provider]  denosumab (PROLIA) 60 MG/ML SOSY injection Inject 60 mg into the skin every 6 (six)  months.    [provider]  folic acid (FOLVITE) 1 MG tablet Take 1 tablet (1 mg total) by mouth daily. 05/03/19   Isaac Bliss, Rayford Halsted, MD  gabapentin (NEURONTIN) 100 MG capsule TAKE 1 CAPSULE BY MOUTH TWICE A DAY Patient taking differently: Take 100 mg by mouth 2 (two) times daily.  10/25/19   Isaac Bliss, Rayford Halsted, MD  Multiple Vitamin (MULTIVITAMIN WITH MINERALS) TABS tablet Take 1 tablet by mouth daily.    [provider]  pantoprazole (PROTONIX) 40 MG tablet TAKE 1 TABLET BY MOUTH EVERY DAY Patient taking differently: Take 40 mg by mouth daily.  10/28/19   Isaac Bliss, Rayford Halsted, MD  PEG-KCl-NaCl-NaSulf-Na Asc-C (PLENVU) 140 g SOLR Take 1 kit by mouth as directed. 01/09/20   Milus Banister, MD  sodium chloride flush (NS) 0.9 % SOLN 5 mLs by Intracatheter route daily. Patient not taking: Reported on 01/09/2020 11/27/19   Maczis, Barth Kirks, PA-C  thiamine 100 MG tablet Take 1 tablet (100 mg total) by mouth daily. Patient taking differently: Take 100 mg by mouth daily. B 1 06/10/19   Isaac Bliss, Rayford Halsted, MD     Family History  Problem Relation Age of Onset  . Stroke Father   . Colon cancer Neg Hx   . Esophageal cancer Neg Hx   . Pancreatic cancer Neg Hx   . Liver disease Neg Hx   . Colon polyps Neg Hx     Social History   Socioeconomic History  . Marital status: Married    Spouse name: Not on file  . Number of children: 1  . Years of education: Not on file  . Highest education level: Not on file  Occupational History  . Occupation: retired    Fish farm manager: UNEMPLOYED  Tobacco Use  . Smoking status: Former Research scientist (life sciences)  . Smokeless tobacco: Never Used  Vaping Use  . Vaping Use: Never assessed  Substance and Sexual Activity  . Alcohol use: Not Currently    Alcohol/week: 7.0 standard drinks    Types: 7 Cans of beer per week    Comment: stopped around 2020  . Drug use: No  . Sexual activity: Not on file  Other Topics Concern  . Not on file    Social History Narrative   Married retired 1 child   Beer drinker 1 a day, no tobacco no drug use   Social Determinants of Radio broadcast assistant Strain:   . Difficulty of Paying Living Expenses:   Food Insecurity:   . Worried About Charity fundraiser in the Last Year:   . Arboriculturist in the Last Year:   Transportation Needs:   . Film/video editor (Medical):   Marland Kitchen Lack of Transportation (Non-Medical):   Physical Activity:   . Days of Exercise per Week:   . Minutes of Exercise per Session:   Stress:   . Feeling of Stress :   Social Connections:   . Frequency of Communication with Friends and Family:   . Frequency of Social Gatherings with Friends and Family:   . Attends Religious Services:   . Active Member of Clubs or Organizations:   .  Attends Archivist Meetings:   Marland Kitchen Marital Status:      Review of Systems  Constitutional: Negative for chills and fatigue.  Gastrointestinal: Negative.  Negative for abdominal pain.    Vital Signs: BP (!) 153/63   Pulse (!) 56   Temp (!) 97.4 F (36.3 C)   SpO2 98%   Physical Exam Vitals reviewed.  Constitutional:      Appearance: She is not ill-appearing.  Abdominal:     General: Abdomen is flat. There is no distension.     Palpations: Abdomen is soft.     Tenderness: There is no abdominal tenderness.     Comments: Percutaneous drain in the anterior left lower abdomen/pelvis.  No drainage or redness around the drain.  Suture is still intact.  Neurological:     Mental Status: She is alert.        Imaging: DG Sinus/Fist Tube Chk-Non GI  Result Date: 12/22/2019 INDICATION: Diverticular abscess status post percutaneous drain. Outpatient follow-up. EXAM: Fluoroscopic injection diverticular abscess drain MEDICATIONS: None. ANESTHESIA/SEDATION: None. COMPLICATIONS: None immediate. PROCEDURE: Informed written consent was obtained from the patient after a thorough discussion of the procedural risks, benefits  and alternatives. All questions were addressed. Maximal Sterile Barrier Technique was utilized including caps, mask, sterile gowns, sterile gloves, sterile drape, hand hygiene and skin antiseptic. A timeout was performed prior to the initiation of the procedure. Comparison made to 12/06/2019. Contrast injection performed. Residual fistula to the adjacent sigmoid colon remains patent. Small residual collapsed abscess cavity noted medial to the drain catheter site. Overall stable appearance. 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 Electronically Signed   By: Jerilynn Mages.  Shick M.D.   On: 12/22/2019 14:19   IR Radiologist Eval & Mgmt  Result Date: 01/10/2020 Please refer to notes tab for details about interventional procedure. (Op Note)  IR Radiologist Eval & Mgmt  Result Date: 12/22/2019 Please refer to notes tab for details about interventional procedure. (Op Note)   Labs:  CBC: Recent Labs    11/24/19 0219 11/25/19 0300 11/26/19 0802 12/13/19 1359  WBC 11.0* 7.5 SPECIMEN CLOTTED 5.8  HGB 12.3 14.5 SPECIMEN CLOTTED 13.5  HCT 36.9 43.8 SPECIMEN CLOTTED 39.8  PLT 347 398 SPECIMEN CLOTTED 346.0    COAGS: Recent Labs    04/06/19 0404 11/24/19 0219  INR 1.2 1.1  APTT 40*  --     BMP: Recent Labs    11/23/19 1356 11/23/19 1356 11/24/19 0219 11/25/19 0300 11/26/19 0802 12/13/19 1359  NA 131*   < > 130* 132* 132* 128*  K 3.7   < > 3.5 3.9 3.5 4.9  CL 96*   < > 99 98 98 93*  CO2 23   < > 21* 20* 24 29  GLUCOSE 102*   < > 88 92 111* 94  BUN <5*   < > <5* <5* <5* 7  CALCIUM 6.3*   < > 6.7* 6.6* 7.0* 8.9  CREATININE 0.44   < > 0.38* 0.44 0.51 0.49  GFRNONAA >60  --  >60 >60 >60  --   GFRAA >60  --  >60 >60 >60  --    < > = values in this interval not displayed.    LIVER FUNCTION TESTS: Recent Labs    06/03/19 0749 11/23/19 0511 11/25/19 0300 11/26/19 0802  BILITOT 0.6 0.9 1.3* 0.4  AST '21 24 23 18  '$ ALT '28 22 18 13  '$ ALKPHOS 99 150* 89 68  PROT 6.7 6.6  5.8*  5.2*  ALBUMIN 4.4 3.1* 2.9* 2.5*    TUMOR MARKERS: No results for input(s): AFPTM, CEA, CA199, CHROMGRNA in the last 8760 hours.  Assessment and Plan:  69 year old with history of diverticular abscess and status post percutaneous drain placement.  Patient is feeling well and reports minimal output from the drain.  Fluid coming out of the drain is a cloudy red/gray material.  Appearance of the output is compatible with residual abscess and/or bowel fistula.  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  There continues to be small residual abscess cavity along the medial and caudal aspect of the drain which has not significantly changed.  I discussed these findings with the patient and husband.  Patient is scheduled to undergo colonoscopy in approximately 2 weeks.  I suggest that we continue the conservative management with keeping the drain to gravity bag and no flushes.  Small residual collection medial to the drain is probably of minimal consequence and it would be difficult to reposition the drain into this area without losing percutaneous access.  We will plan for follow-up drain injection after the colonoscopy.  Patient and family are comfortable with this plan.  They will continue to monitor the output.  Thank you for this interesting consult.  I greatly enjoyed meeting Minahil Ameia Morency and look forward to participating in their care.  A copy of this report was sent to the requesting provider on this date.  Electronically Signed: Burman Riis 01/10/2020, 10:03 AM   I spent a total of    10 Minutes in face to face in clinical consultation, greater than 50% of which was counseling/coordinating care for abscess drain care.  Patient ID: Lisvet Rasheed, female   DOB: 1951/02/07, 69 y.o.   MRN: [id number]

## 2020-01-11 ENCOUNTER — Encounter: Payer: Self-pay | Admitting: Gastroenterology

## 2020-01-11 ENCOUNTER — Ambulatory Visit (HOSPITAL_COMMUNITY): Admit: 2020-01-11 | Payer: PPO | Admitting: Internal Medicine

## 2020-01-11 ENCOUNTER — Encounter (HOSPITAL_COMMUNITY): Payer: Self-pay

## 2020-01-11 SURGERY — COLONOSCOPY WITH PROPOFOL
Anesthesia: Monitor Anesthesia Care

## 2020-01-24 ENCOUNTER — Other Ambulatory Visit (HOSPITAL_COMMUNITY): Payer: PPO

## 2020-01-24 ENCOUNTER — Other Ambulatory Visit (HOSPITAL_COMMUNITY)
Admission: RE | Admit: 2020-01-24 | Discharge: 2020-01-24 | Disposition: A | Discharge status: Home / Self Care | Payer: PPO | Source: Ambulatory Visit | Attending: Gastroenterology | Admitting: Gastroenterology

## 2020-01-24 DIAGNOSIS — Z7983 Long term (current) use of bisphosphonates: Secondary | ICD-10-CM | POA: Diagnosis not present

## 2020-01-24 DIAGNOSIS — Z1211 Encounter for screening for malignant neoplasm of colon: Secondary | ICD-10-CM | POA: Diagnosis not present

## 2020-01-24 DIAGNOSIS — Z87891 Personal history of nicotine dependence: Secondary | ICD-10-CM | POA: Diagnosis not present

## 2020-01-24 DIAGNOSIS — Z79899 Other long term (current) drug therapy: Secondary | ICD-10-CM | POA: Diagnosis not present

## 2020-01-24 DIAGNOSIS — I10 Essential (primary) hypertension: Secondary | ICD-10-CM | POA: Diagnosis not present

## 2020-01-24 DIAGNOSIS — Z20822 Contact with and (suspected) exposure to covid-19: Secondary | ICD-10-CM | POA: Diagnosis not present

## 2020-01-24 DIAGNOSIS — K219 Gastro-esophageal reflux disease without esophagitis: Secondary | ICD-10-CM | POA: Diagnosis not present

## 2020-01-24 DIAGNOSIS — K5730 Diverticulosis of large intestine without perforation or abscess without bleeding: Secondary | ICD-10-CM | POA: Diagnosis not present

## 2020-01-24 DIAGNOSIS — M810 Age-related osteoporosis without current pathological fracture: Secondary | ICD-10-CM | POA: Diagnosis not present

## 2020-01-24 LAB — SARS CORONAVIRUS 2 (TAT 6-24 HRS): SARS Coronavirus 2: NEGATIVE

## 2020-01-26 ENCOUNTER — Encounter (HOSPITAL_COMMUNITY)
Admission: RE | Disposition: A | Discharge status: Home / Self Care | Payer: Self-pay | Source: Home / Self Care | Attending: Gastroenterology

## 2020-01-26 ENCOUNTER — Ambulatory Visit (HOSPITAL_COMMUNITY): Payer: PPO | Admitting: Certified Registered"

## 2020-01-26 ENCOUNTER — Encounter (HOSPITAL_COMMUNITY): Payer: Self-pay | Admitting: Gastroenterology

## 2020-01-26 ENCOUNTER — Ambulatory Visit (HOSPITAL_COMMUNITY)
Admission: RE | Admit: 2020-01-26 | Discharge: 2020-01-26 | Disposition: A | Discharge status: Home / Self Care | Payer: PPO | Attending: Gastroenterology | Admitting: Gastroenterology

## 2020-01-26 DIAGNOSIS — I10 Essential (primary) hypertension: Secondary | ICD-10-CM | POA: Diagnosis not present

## 2020-01-26 DIAGNOSIS — Z1211 Encounter for screening for malignant neoplasm of colon: Secondary | ICD-10-CM

## 2020-01-26 DIAGNOSIS — Z79899 Other long term (current) drug therapy: Secondary | ICD-10-CM | POA: Diagnosis not present

## 2020-01-26 DIAGNOSIS — K219 Gastro-esophageal reflux disease without esophagitis: Secondary | ICD-10-CM | POA: Insufficient documentation

## 2020-01-26 DIAGNOSIS — Z7983 Long term (current) use of bisphosphonates: Secondary | ICD-10-CM | POA: Insufficient documentation

## 2020-01-26 DIAGNOSIS — K5720 Diverticulitis of large intestine with perforation and abscess without bleeding: Secondary | ICD-10-CM

## 2020-01-26 DIAGNOSIS — Z20822 Contact with and (suspected) exposure to covid-19: Secondary | ICD-10-CM | POA: Diagnosis not present

## 2020-01-26 DIAGNOSIS — Z87891 Personal history of nicotine dependence: Secondary | ICD-10-CM | POA: Insufficient documentation

## 2020-01-26 DIAGNOSIS — M810 Age-related osteoporosis without current pathological fracture: Secondary | ICD-10-CM | POA: Diagnosis not present

## 2020-01-26 DIAGNOSIS — K5730 Diverticulosis of large intestine without perforation or abscess without bleeding: Secondary | ICD-10-CM | POA: Insufficient documentation

## 2020-01-26 HISTORY — PX: COLONOSCOPY WITH PROPOFOL: SHX5780

## 2020-01-26 SURGERY — COLONOSCOPY WITH PROPOFOL
Anesthesia: Monitor Anesthesia Care

## 2020-01-26 MED ORDER — PROPOFOL 500 MG/50ML IV EMUL
INTRAVENOUS | Status: DC | PRN
  Administered 2020-01-26: 125 ug/kg/min via INTRAVENOUS

## 2020-01-26 MED ORDER — LIDOCAINE 2% (20 MG/ML) 5 ML SYRINGE
INTRAMUSCULAR | Status: DC | PRN
  Administered 2020-01-26: 80 mg via INTRAVENOUS

## 2020-01-26 MED ORDER — PROPOFOL 10 MG/ML IV BOLUS
INTRAVENOUS | Status: DC | PRN
  Administered 2020-01-26: 20 mg via INTRAVENOUS

## 2020-01-26 MED ORDER — PROPOFOL 500 MG/50ML IV EMUL
INTRAVENOUS | Status: AC
  Filled 2020-01-26: qty 50

## 2020-01-26 MED ORDER — LACTATED RINGERS IV SOLN: INTRAVENOUS | Status: DC

## 2020-01-26 MED ORDER — DEXAMETHASONE SODIUM PHOSPHATE 10 MG/ML IJ SOLN
INTRAMUSCULAR | Status: DC | PRN
  Administered 2020-01-26: 10 mg via INTRAVENOUS

## 2020-01-26 MED ORDER — ONDANSETRON HCL 4 MG/2ML IJ SOLN
INTRAMUSCULAR | Status: DC | PRN
  Administered 2020-01-26: 4 mg via INTRAVENOUS

## 2020-01-26 MED ORDER — SODIUM CHLORIDE 0.9 % IV SOLN: INTRAVENOUS | Status: DC

## 2020-01-26 SURGICAL SUPPLY — 21 items

## 2020-01-26 NOTE — Anesthesia Preprocedure Evaluation (Signed)
Anesthesia Evaluation  Patient identified by MRN, date of birth, ID band Patient awake    Reviewed: Allergy & Precautions, NPO status , Patient's Chart, lab work & pertinent test results  Airway Mallampati: I  TM Distance: >3 FB Neck ROM: Full    Dental   Pulmonary former smoker,    Pulmonary exam normal        Cardiovascular hypertension, Pt. on medications Normal cardiovascular exam     Neuro/Psych Anxiety Dementia    GI/Hepatic GERD  Medicated and Controlled,  Endo/Other    Renal/GU      Musculoskeletal   Abdominal   Peds  Hematology   Anesthesia Other Findings   Reproductive/Obstetrics                             Anesthesia Physical Anesthesia Plan  ASA: III  Anesthesia Plan: MAC   Post-op Pain Management:    Induction: Intravenous  PONV Risk Score and Plan: 2  Airway Management Planned: Nasal Cannula  Additional Equipment:   Intra-op Plan:   Post-operative Plan:   Informed Consent: I have reviewed the patients History and Physical, chart, labs and discussed the procedure including the risks, benefits and alternatives for the proposed anesthesia with the patient or authorized representative who has indicated his/her understanding and acceptance.       Plan Discussed with: CRNA and Surgeon  Anesthesia Plan Comments:         Anesthesia Quick Evaluation

## 2020-01-26 NOTE — Interval H&P Note (Signed)
History and Physical Interval Note:  01/26/2020 10:05 AM  [name]  has presented today for surgery, with the diagnosis of diverticulosis with abscess.  The various methods of treatment have been discussed with the patient and family. After consideration of risks, benefits and other options for treatment, the patient has consented to  Procedure(s) with comments: COLONOSCOPY WITH PROPOFOL (N/A) - ultraslim scope as a surgical intervention.  The patient's history has been reviewed, patient examined, no change in status, stable for surgery.  I have reviewed the patient's chart and labs.  Questions were answered to the patient's satisfaction.     Rachael Fee

## 2020-01-26 NOTE — Op Note (Signed)
[facility] Patient Name: [name] Procedure Date: 01/26/2020 MRN: [id number] Attending MD: Rachael Fee , MD Date of Birth: Dec 21, 1950 CSN: [id number] Age: 69 Admit Type: Inpatient Procedure:                Colonoscopy Indications:              recent acute diverticulitis with pelvic abscess                            requiring drain placement, persistent fistula                            between abscess cavity and sigmoid colon, upcoming                            colon resection with Dr. Maisie Fus; 2008 colonoscopy                            Dr. [name] showed divertcular changes in left colon Providers:                Rachael Fee, MD, [place] RN, RN, Everardo Pacific, Technician, [facility], CRNA Referring MD:             Dr. [name] Medicines:                Monitored Anesthesia Care Complications:            No immediate complications. Estimated blood loss:                            None. Estimated Blood Loss:     Estimated blood loss: none. Procedure:                Pre-Anesthesia Assessment:                           - Prior to the procedure, a History and Physical                            was performed, and patient medications and                            allergies were reviewed. The patient's tolerance of                            previous anesthesia was also reviewed. The risks                            and benefits of the procedure and the sedation                            options and risks were discussed with the patient.  All questions were answered, and informed consent                            was obtained. Prior Anticoagulants: The patient has                            taken no previous anticoagulant or antiplatelet                            agents. ASA Grade Assessment: III - A patient with                            severe systemic disease. After reviewing the risks                             and benefits, the patient was deemed in                            satisfactory condition to undergo the procedure.                           After obtaining informed consent, the colonoscope                            was passed under direct vision. Throughout the                            procedure, the patient's blood pressure, pulse, and                            oxygen saturations were monitored continuously. The                            [id number]-H190DL ([id number] Olympus pediatric colonscope                            was introduced through the anus and advanced to the                            the cecum, identified by appendiceal orifice and                            ileocecal valve. The colonoscopy was performed                            without difficulty. The patient tolerated the                            procedure well. The quality of the bowel                            preparation was good. The ileocecal valve,  appendiceal orifice, and rectum were photographed. Scope In: 10:48:30 AM Scope Out: 11:05:50 AM Scope Withdrawal Time: 0 hours 4 minutes 20 seconds  Total Procedure Duration: 0 hours 17 minutes 20 seconds  Findings:      Multiple small and large-mouthed diverticula were found in the left       colon. This was associated with significant mucosal edema, erythema as       well as luminal tortuosity and narrowing.      The exam was otherwise without abnormality on direct and retroflexion       views. Impression:               - Diverticulosis in the left colon. This was                            associated with significant mucosal edema, erythema                            as well as luminal tortuosity and narrowing.                           - The examination was otherwise normal on direct                            and retroflexion views.                           - No polyps or cancers. Moderate Sedation:       Not Applicable - Patient had care per Anesthesia. Recommendation:           - Patient has a contact number available for                            emergencies. The signs and symptoms of potential                            delayed complications were discussed with the                            patient. Return to normal activities tomorrow.                            Written discharge instructions were provided to the                            patient.                           - Resume previous diet.                           - Continue present medications.                           - Repeat colonoscopy in 10 years for screening. Procedure Code(s):        --- Professional ---  16109, Colonoscopy, flexible; diagnostic, including                            collection of specimen(s) by brushing or washing,                            when performed (separate procedure) Diagnosis Code(s):        --- Professional ---                           Z12.11, Encounter for screening for malignant                            neoplasm of colon                           K57.30, Diverticulosis of large intestine without                            perforation or abscess without bleeding CPT copyright 2019 American Medical Association. All rights reserved. The codes documented in this report are preliminary and upon coder review may  be revised to meet current compliance requirements. Rachael Fee, MD 01/26/2020 11:15:07 AM This report has been signed electronically. Number of Addenda: 0

## 2020-01-26 NOTE — Anesthesia Postprocedure Evaluation (Signed)
Anesthesia Post Note  Patient: Camiya Vinal  Procedure(s) Performed: COLONOSCOPY WITH PROPOFOL (N/A )     Patient location during evaluation: PACU Anesthesia Type: MAC Level of consciousness: awake and alert Pain management: pain level controlled Vital Signs Assessment: post-procedure vital signs reviewed and stable Respiratory status: spontaneous breathing, nonlabored ventilation, respiratory function stable and patient connected to nasal cannula oxygen Cardiovascular status: stable and blood pressure returned to baseline Postop Assessment: no apparent nausea or vomiting Anesthetic complications: no   No complications documented.  Last Vitals:  Vitals:   01/26/20 1120 01/26/20 1130  BP: (!) 155/73 (!) 165/85  Pulse: 61 64  Resp: 16 16  Temp:    SpO2: 100% 98%    Last Pain:  Vitals:   01/26/20 1130  TempSrc:   PainSc: 0-No pain                 Amesha Bailey DAVID

## 2020-01-26 NOTE — Transfer of Care (Signed)
Immediate Anesthesia Transfer of Care Note  Patient: Neleh Muldoon  Procedure(s) Performed: COLONOSCOPY WITH PROPOFOL (N/A )  Patient Location: PACU  Anesthesia Type:MAC  Level of Consciousness: awake, alert  and oriented  Airway & Oxygen Therapy: Patient Spontanous Breathing and Patient connected to nasal cannula oxygen  Post-op Assessment: Report given to RN and Post -op Vital signs reviewed and stable  Post vital signs: Reviewed and stable  Last Vitals:  Vitals Value Taken Time  BP    Temp    Pulse 66 01/26/20 1113  Resp 19 01/26/20 1113  SpO2 98 % 01/26/20 1113  Vitals shown include unvalidated device data.  Last Pain:  Vitals:   01/26/20 1025  TempSrc: Oral  PainSc: 0-No pain         Complications: No complications documented.

## 2020-01-26 NOTE — Discharge Instructions (Signed)

## 2020-01-26 NOTE — Anesthesia Procedure Notes (Signed)
Performed by: Gerlad Pelzel D, CRNA Oxygen Delivery Method: Nasal cannula       

## 2020-01-27 ENCOUNTER — Encounter (HOSPITAL_COMMUNITY): Payer: Self-pay | Admitting: Gastroenterology

## 2020-01-27 DIAGNOSIS — K5792 Diverticulitis of intestine, part unspecified, without perforation or abscess without bleeding: Secondary | ICD-10-CM | POA: Diagnosis not present

## 2020-01-31 ENCOUNTER — Ambulatory Visit
Admission: RE | Admit: 2020-01-31 | Discharge: 2020-01-31 | Disposition: A | Discharge status: Home / Self Care | Payer: PPO | Source: Ambulatory Visit | Attending: General Surgery | Admitting: General Surgery

## 2020-01-31 ENCOUNTER — Encounter: Payer: Self-pay | Admitting: Radiology

## 2020-01-31 DIAGNOSIS — K5720 Diverticulitis of large intestine with perforation and abscess without bleeding: Secondary | ICD-10-CM | POA: Diagnosis not present

## 2020-01-31 DIAGNOSIS — K5721 Diverticulitis of large intestine with perforation and abscess with bleeding: Secondary | ICD-10-CM

## 2020-01-31 DIAGNOSIS — K632 Fistula of intestine: Secondary | ICD-10-CM | POA: Diagnosis not present

## 2020-01-31 HISTORY — PX: IR RADIOLOGIST EVAL & MGMT: IMG5224

## 2020-01-31 NOTE — Progress Notes (Signed)
Chief Complaint: Patient was seen in consultation today for drain follow-up.  Referring Physician(s): Thomas,Alicia  History of Present Illness: [name] is a 69 y.o. female with history of diverticular abscess and percutaneous drain placement on 11/23/2019.  Prior imaging has demonstrated a fistula connection to the sigmoid colon and small residual cavity along the medial aspect of the drain.  Patient recently underwent a colonoscopy by Dr. Ardis Hughs on 01/26/2020.  Colonoscopy demonstrated diverticulosis in the left colon with associated mucosal edema, erythema, tortuosity and luminal narrowing.  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  No new complaints today.  Past Medical History:  Diagnosis Date  . Alcoholism ([place]) 02/02/2009  . ANXIETY 01/21/2007  . Blood transfusion    hx of 2005   . Diverticulitis   . Diverticulitis of large intestine with abscess 04/05/2019  . DIVERTICULOSIS 07/09/2007  . Duodenal ulcer hemorrhage perforated 2006  . GERD (gastroesophageal reflux disease)   . Headache(784.0) 05/31/2007   pt denies at 08/12/11 preop visit   . HYPERTENSION 06/23/2007  . HYPONATREMIA 02/02/2009   better off diuretic  . INSOMNIA 05/31/2007  . Iron deficiency anemia    pt denies at 08/12/11 visit   . Marginal ulcer, at gastrojejunostomy, resected 08/14/2011. 09/19/2011  . NSAID-induced duodenal ulcer   . OSTEOPOROSIS 01/21/2007  . Raynaud's syndrome 06/23/2007  . Recurrent upper respiratory infection (URI)    pt reports slight cold using Nyquil prn at bedtime   . Urinary tract infection 11/23/2019  . VITAMIN B12 DEFICIENCY 10/02/2009  . Zoster 2012   twice - left groin/vagina    Past Surgical History:  Procedure Laterality Date  . COLONOSCOPY  06/18/2007   angulated and stenotic sigmoid colon with severe sigmoid diverticulosis  . COLONOSCOPY WITH PROPOFOL N/A 01/26/2020   Procedure: COLONOSCOPY WITH  PROPOFOL;  Surgeon: Milus Banister, MD;  Location: [facility];  Service: Endoscopy;  Laterality: N/A;  ultraslim scope  . EXPLORATORY LAPAROTOMY  08/03/2004   1) duodenal ulcer oversew, pyloroplasty, anterior, posterior and truncal vagotomies  . EXPLORATORY LAPAROTOMY  10/20/2004   1) duodenal ulcer oversew  with exclusion2) side-side gastrojejunostomy 3) feeding jejunostomy  . IR RADIOLOGIST EVAL & MGMT  04/26/2019  . IR RADIOLOGIST EVAL & MGMT  05/10/2019  . IR RADIOLOGIST EVAL & MGMT  06/07/2019  . IR RADIOLOGIST EVAL & MGMT  12/06/2019  . IR RADIOLOGIST EVAL & MGMT  12/22/2019  . IR RADIOLOGIST EVAL & MGMT  01/10/2020  . IR RADIOLOGIST EVAL & MGMT  01/31/2020  . IR SINUS/FIST TUBE CHK-NON GI  04/12/2019  . LAPAROSCOPY  08/14/2011   Procedure: LAPAROSCOPY DIAGNOSTIC;  Surgeon: Shann Medal, MD;  Location: [facility];  Service: General;  Laterality: N/A;  . LAPAROTOMY  08/14/2011   Procedure: EXPLORATORY LAPAROTOMY;  Surgeon: Shann Medal, MD;  Location: [facility];  Service: General;  Laterality: N/A;  exploratory laparotomy, lysis of adhesions, revision gastrojejunostomy, upper endoscopy  . OPEN REDUCTION INTERNAL FIXATION (ORIF) DISTAL RADIAL FRACTURE Right 05/01/2016   Procedure: OPEN REDUCTION INTERNAL FIXATION (ORIF) DISTAL RADIAL FRACTURE, right;  Surgeon: Leanora Cover, MD;  Location: [facility];  Service: Orthopedics;  Laterality: Right;  . TUBAL LIGATION  1977  . UPPER GASTROINTESTINAL ENDOSCOPY  09/16/2007; 12/21/2008; 03/17/2011   2009: Normal 2010: Normal with enteroscopy2012: Gastrojejunal  anastomotic ulcer, gastric retention    Allergies: Sulfa antibiotics, Sulfonamide derivatives, Bee venom, Dilaudid [hydromorphone hcl], Penicillins, and  Hydromorphone hcl  Medications: Prior to Admission medications   Medication Sig Start Date End Date Taking? Authorizing Provider  acetaminophen (TYLENOL) 500 MG tablet Take 500 mg by mouth every 6 (six) hours as needed.    [provider]  calcium carbonate (OS-CAL - DOSED IN MG OF ELEMENTAL CALCIUM) 1250 (500 Ca) MG tablet Take 500 mg by mouth daily.     [provider]  denosumab (PROLIA) 60 MG/ML SOSY injection Inject 60 mg into the skin every 6 (six) months.    [provider]  folic acid (FOLVITE) 1 MG tablet Take 1 tablet (1 mg total) by mouth daily. 05/03/19   Isaac Bliss, Rayford Halsted, MD  gabapentin (NEURONTIN) 100 MG capsule TAKE 1 CAPSULE BY MOUTH TWICE A DAY Patient taking differently: Take 100 mg by mouth 2 (two) times daily.  10/25/19   Isaac Bliss, Rayford Halsted, MD  Multiple Vitamin (MULTIVITAMIN WITH MINERALS) TABS tablet Take 1 tablet by mouth daily. Centrum Silver    [provider]  pantoprazole (PROTONIX) 40 MG tablet TAKE 1 TABLET BY MOUTH EVERY DAY Patient taking differently: Take 40 mg by mouth daily.  10/28/19   Isaac Bliss, Rayford Halsted, MD  PEG-KCl-NaCl-NaSulf-Na Asc-C (PLENVU) 140 g SOLR Take 1 kit by mouth as directed. 01/09/20   Milus Banister, MD  thiamine 100 MG tablet Take 1 tablet (100 mg total) by mouth daily. 06/10/19   Erline Hau, MD     Family History  Problem Relation Age of Onset  . Stroke Father   . Colon cancer Neg Hx   . Esophageal cancer Neg Hx   . Pancreatic cancer Neg Hx   . Liver disease Neg Hx   . Colon polyps Neg Hx     Social History   Socioeconomic History  . Marital status: Married    Spouse name: Not on file  . Number of children: 1  . Years of education: Not on file  . Highest education level: Not on file  Occupational History  . Occupation: retired    Fish farm manager: UNEMPLOYED  Tobacco Use  . Smoking status: Former Research scientist (life sciences)  . Smokeless tobacco: Never Used  Vaping Use  . Vaping Use: Never assessed  Substance and Sexual Activity  . Alcohol use: Not Currently    Alcohol/week: 7.0 standard drinks    Types: 7 Cans of beer per week    Comment: stopped around 2020  . Drug use: No  . Sexual activity: Not on  file  Other Topics Concern  . Not on file  Social History Narrative   Married retired 1 child   Beer drinker 1 a day, no tobacco no drug use   Social Determinants of Radio broadcast assistant Strain:   . Difficulty of Paying Living Expenses:   Food Insecurity:   . Worried About Charity fundraiser in the Last Year:   . Arboriculturist in the Last Year:   Transportation Needs:   . Film/video editor (Medical):   Marland Kitchen Lack of Transportation (Non-Medical):   Physical Activity:   . Days of Exercise per Week:   . Minutes of Exercise per Session:   Stress:   . Feeling of Stress :   Social Connections:   . Frequency of Communication with Friends and Family:   . Frequency of Social Gatherings with Friends and Family:   . Attends Religious Services:   . Active Member of Clubs or Organizations:   . Attends  Club or Organization Meetings:   Marland Kitchen Marital Status:      Review of Systems  Constitutional: Negative.   Gastrointestinal:       Discomfort at drain site.    Vital Signs: BP (!) 143/74   Pulse (!) 59   SpO2 96%   Physical Exam Constitutional:      General: She is not in acute distress. Abdominal:     Comments: Left lower quadrant drain is intact.  No significant erythema or discharge around the drain.  Neurological:     Mental Status: She is alert.        Imaging: DG Sinus/Fist Tube Chk-Non GI  Result Date: 01/10/2020 INDICATION: 69 year old with history of diverticular abscess and status post percutaneous drain placement. Previous drain injection demonstrates a colonic fistula. Patient reports minimal output from the drain. EXAM: DRAIN INJECTION WITH FLUOROSCOPY MEDICATIONS: None ANESTHESIA/SEDATION: None FLUOROSCOPY TIME:  36 seconds, 20 mGy CONTRAST:  20 mL Omnipaque 885 COMPLICATIONS: None immediate. PROCEDURE: Patient was placed supine on the fluoroscopic table. Scout image was obtained. Contrast was injected through the tube and multiple views of the injection  were obtained. Contrast was aspirated at the end of the procedure. Catheter was flushed with normal saline and attached to a gravity bag. A new StatLock was placed. FINDINGS: Drain in the left hemipelvis has not significantly changed in position. Injection of contrast again demonstrates a small residual cavity towards the central aspect of the pelvis. In addition, contrast fills the adjacent sigmoid colon. Contrast refluxes into the descending colon. IMPRESSION: 1. 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 2. Persistent small residual cavity just medial and caudal to the pigtail drain. This small collection has not significantly changed from the previous examination. Electronically Signed   By: Markus Daft M.D.   On: 01/10/2020 10:01   IR Radiologist Eval & Mgmt  Result Date: 01/31/2020 Please refer to notes tab for details about interventional procedure. (Op Note)  IR Radiologist Eval & Mgmt  Result Date: 01/10/2020 Please refer to notes tab for details about interventional procedure. (Op Note)   Labs:  CBC: Recent Labs    11/24/19 0219 11/25/19 0300 11/26/19 0802 12/13/19 1359  WBC 11.0* 7.5 SPECIMEN CLOTTED 5.8  HGB 12.3 14.5 SPECIMEN CLOTTED 13.5  HCT 36.9 43.8 SPECIMEN CLOTTED 39.8  PLT 347 398 SPECIMEN CLOTTED 346.0    COAGS: Recent Labs    04/06/19 0404 11/24/19 0219  INR 1.2 1.1  APTT 40*  --     BMP: Recent Labs    11/23/19 1356 11/23/19 1356 11/24/19 0219 11/25/19 0300 11/26/19 0802 12/13/19 1359  NA 131*   < > 130* 132* 132* 128*  K 3.7   < > 3.5 3.9 3.5 4.9  CL 96*   < > 99 98 98 93*  CO2 23   < > 21* 20* 24 29  GLUCOSE 102*   < > 88 92 111* 94  BUN <5*   < > <5* <5* <5* 7  CALCIUM 6.3*   < > 6.7* 6.6* 7.0* 8.9  CREATININE 0.44   < > 0.38* 0.44 0.51 0.49  GFRNONAA >60  --  >60 >60 >60  --   GFRAA >60  --  >60 >60 >60  --    < > = values in this interval not displayed.    LIVER FUNCTION TESTS: Recent Labs    06/03/19 0749 11/23/19 0511  11/25/19 0300 11/26/19 0802  BILITOT 0.6 0.9 1.3* 0.4  AST _0 18  ALT _0 ALKPHOS 99 150* 89 68  PROT 6.7 6.6 5.8* 5.2*  ALBUMIN 4.4 3.1* 2.9* 2.5*    TUMOR MARKERS: No results for input(s): AFPTM, CEA, CA199, CHROMGRNA in the last 8760 hours.  Assessment and Plan:  69 year old with history of diverticular abscess and persistent colonic fistula.  Drain injection again demonstrates a colonic fistula.  Again noted is a small residual cavity just medial to the drain as well.  These findings have not significantly changed since the previous drain injection.  Patient is scheduled to follow-up with her general surgeon, Dr. Leighton Ruff.  We will continue with gravity drainage and await recommendations from Dr. Marcello Moores.    Electronically Signed: Burman Riis 01/31/2020, 4:36 PM   I spent a total of    10 Minutes in face to face in clinical consultation, greater than 50% of which was counseling/coordinating care for drain management.    Patient ID: [name], female   DOB: [birth year], 69 y.o.   MRN: [id number]

## 2020-02-07 ENCOUNTER — Ambulatory Visit: Payer: Self-pay | Admitting: General Surgery

## 2020-02-07 DIAGNOSIS — K5792 Diverticulitis of intestine, part unspecified, without perforation or abscess without bleeding: Secondary | ICD-10-CM | POA: Diagnosis not present

## 2020-02-07 NOTE — H&P (Signed)
The patient is a 69 year old female who presents with diverticulitis. 69 year old female who is here for hospital follow-up after recent hospitalization for diverticulitis. Patient has a history of alcohol abuse and presented to the hospital malnourished. She underwent left lower quadrant JP drain placement in IR. 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 She has a history of exploratory laparotomy in January 2006 with [name] patch, pyloroplasty and vagotomy's. She underwent another surgery in April 2006. 4. [name] procedure. Due to persistent ulcerations. She underwent another exploratory laparotomy and lysis of adhesions in January 2013 , with revision of her gastrojejunostomy. She also underwent a tubal ligation in 1977. Patient was hospitalized for over a week at [facility] in fall of 2020. She was noticed to be malnourished and was placed on TPN. It appears she slowly recovered. She did see Dr. [name], who felt a colonoscopy was warranted when she improved. It appears she never followed up with his appointment. She then presented to the hospital and early May 2021 with a recurrent episode of diverticulitis with a 14 cm abscess. This was drained by interventional radiology. She was discharged home in stable condition. Her husband reports she has a good appetite and is gaining weight. Albumin was 2.5 on lab work from the hospital and 4.2 last week. She underwent a colonoscopy which showed an edematous sigmoid colon but no other findings. She is here today to discuss surgery.   Problem List/Past Medical Kirsten Levee, MD; 02/07/2020 9:19 AM) DIVERTICULITIS [phone])  Past Surgical History Kirsten Levee, MD; 02/07/2020 9:19 AM) Hysterectomy (not due to cancer) - Partial  Diagnostic Studies History Kirsten Levee, MD; 02/07/2020 9:19 AM) Mammogram never Pap Smear >5 years ago  Allergies [name], RMA; 02/07/2020 8:56 AM) Penicillins  Itching, Rash. Dilaudid *ANALGESICS - OPIOID* Rash. Allergies Reconciled  Medication History Kirsten Levee, MD; 02/07/2020 9:19 AM) Gabapentin (100MG  Capsule, Oral) Active. Folic Acid (1MG  Tablet, Oral) Active. Vitamin B-1 (100MG  Tablet, Oral) Active. Pantoprazole Sodium (40MG  Tablet DR, Oral) Active. Medications Reconciled Tylenol (500MG  Capsule, Oral) Active. Neomycin Sulfate (500MG  Tablet, 2 (two) Oral SEE NOTE, Taken starting 02/07/2020) Active. (TAKE TWO TABLETS AT 2 PM, 3 PM, AND 10 PM THE DAY PRIOR TO SURGERY) Flagyl (500MG  Tablet, 2 (two) Oral SEE NOTE, Taken starting 02/07/2020) Active. (Take at 2pm, 3pm, and 10pm the day prior to your colon operation)  Social History , MD; 02/07/2020 9:19 AM) Alcohol use Recently quit alcohol use. Caffeine use Tea. No drug use Tobacco use Former smoker.  Family History , MD; 02/07/2020 9:19 AM) Breast Cancer Mother. Respiratory Condition Father.  Pregnancy / Birth History 02/09/2020, MD; 02/07/2020 9:19 AM) Age at menarche 13 years. Age of menopause 44-55 Gravida 1 Irregular periods Maternal age 20-25 Para 1  Other Problems 02/09/2020, MD; 02/07/2020 9:19 AM) Alcohol Abuse Diverticulosis Gastric Ulcer     Review of Systems 02/09/2020 MD; 02/07/2020 9:20 AM) General Not Present- Appetite Loss, Chills, Fatigue, Fever, Night Sweats, Weight Gain and Weight Loss. Skin Not Present- Change in Wart/Mole, Dryness, Hives, Jaundice, New Lesions, Non-Healing Wounds, Rash and Ulcer. HEENT Not Present- Earache, Hearing Loss, Hoarseness, Nose Bleed, Oral Ulcers, Ringing in the Ears, Seasonal Allergies, Sinus Pain, Sore Throat, Visual Disturbances, Wears glasses/contact lenses and Yellow Eyes. Respiratory Not Present- Bloody sputum, Chronic Cough, Difficulty Breathing, Snoring and Wheezing. Breast Not Present- Breast Mass, Breast Pain, Nipple Discharge and Skin Changes. Cardiovascular  Not Present- Chest Pain, Difficulty Breathing Lying Down, Leg  Cramps, Palpitations, Rapid Heart Rate, Shortness of Breath and Swelling of Extremities. Gastrointestinal Not Present- Abdominal Pain, Bloating, Bloody Stool, Change in Bowel Habits, Chronic diarrhea, Constipation, Difficulty Swallowing, Excessive gas, Gets full quickly at meals, Hemorrhoids, Indigestion, Nausea, Rectal Pain and Vomiting. Female Genitourinary Not Present- Frequency, Nocturia, Painful Urination, Pelvic Pain and Urgency. Musculoskeletal Not Present- Back Pain, Joint Pain, Joint Stiffness, Muscle Pain, Muscle Weakness and Swelling of Extremities. Neurological Not Present- Decreased Memory, Fainting, Headaches, Numbness, Seizures, Tingling, Tremor, Trouble walking and Weakness. Psychiatric Not Present- Anxiety, Bipolar, Change in Sleep Pattern, Depression, Fearful and Frequent crying. Endocrine Not Present- Cold Intolerance, Excessive Hunger, Hair Changes, Heat Intolerance, Hot flashes and New Diabetes. Hematology Not Present- Blood Thinners, Easy Bruising, Excessive bleeding, Gland problems, HIV and Persistent Infections.  Vitals [name] Botines RMA; 02/07/2020 8:58 AM) 02/07/2020 8:58 AM Weight: 111.5 lb Height: 63in Body Surface Area: 1.51 m Body Mass Index: 19.75 kg/m  Temp.: 97.57F  Pulse: 100 (Regular)  P.OX: 93% (Room air) BP: 110/68(Sitting, Left Arm, Standard)        Physical Exam Kirsten Levee MD; 02/07/2020 9:20 AM)  General Mental Status-Alert. General Appearance-Cooperative.  Abdomen Palpation/Percussion Palpation and Percussion of the abdomen reveal - Soft.    Assessment & Plan Kirsten Levee MD; 02/07/2020 9:18 AM)  DIVERTICULITIS (B35.32) Impression: 69 year old female who presents to the office with a recurrent diverticulitis with abscess. Status post drain placement May 2021. She is now status post a negative colonoscopy. Fistulogram shows persistent colonic fistula. Patient  has resolved her malnutrition issues and is gaining weight. Her albumin is greater than 4. She has a history of multiple abdominal surgeries in her upper abdomen and therefore I have recommended an open sigmoidectomy. We have discussed this in detail including time in the hospital and postoperative recovery. All questions were answered. Given the placement of the patient's abscess, I have recommended stent placement in the left ureter prior to surgery. The surgery and anatomy were described to the patient as well as the risks of surgery and the possible complications. These include: Bleeding, deep abdominal infections and possible wound complications such as hernia and infection, damage to adjacent structures, leak of surgical connections, which can lead to other surgeries and possibly an ostomy, possible need for other procedures, such as abscess drains in radiology, possible prolonged hospital stay, possible diarrhea from removal of part of the colon, possible constipation from narcotics, possible bowel, bladder or sexual dysfunction if having rectal surgery, prolonged fatigue/weakness or appetite loss, possible early recurrence of of disease, possible complications of their medical problems such as heart disease or arrhythmias or lung problems, death (less than 1%). I believe the patient understands and wishes to proceed with the surgery.

## 2020-02-15 ENCOUNTER — Other Ambulatory Visit: Payer: Self-pay | Admitting: Urology

## 2020-02-28 ENCOUNTER — Encounter (HOSPITAL_COMMUNITY): Payer: Self-pay | Admitting: General Surgery

## 2020-02-28 NOTE — Progress Notes (Signed)
DUE TO COVID-19 ONLY ONE VISITOR IS ALLOWED TO COME WITH YOU AND STAY IN THE WAITING ROOM ONLY DURING PRE OP AND PROCEDURE DAY OF SURGERY. THE 1 VISITOR  MAY VISIT WITH YOU AFTER SURGERY IN YOUR PRIVATE ROOM DURING VISITING HOURS ONLY!  YOU NEED TO HAVE A COVID 19 TEST ON_______ @_______ , THIS TEST MUST BE DONE BEFORE SURGERY,  COVID TESTING SITE [address] [place] , IT IS ON THE RIGHT GOING OUT [address] APPROXIMATELY  2 MINUTES PAST [employer] ON THE RIGHT. ONCE YOUR COVID TEST IS COMPLETED,  PLEASE BEGIN THE QUARANTINE INSTRUCTIONS AS OUTLINED IN YOUR HANDOUT.                [name]  02/28/2020   Your procedure is scheduled on: 03/09/2020     Report to [facility] Main  Entrance   Report to admitting at     0530 AM     Call this number if you have problems the morning of surgery [phone]    Remember: Do not eat food    :After Midnight. BRUSH YOUR TEETH MORNING OF SURGERY AND RINSE YOUR MOUTH OUT, NO CHEWING GUM CANDY OR MINTS.  NO SOLID FOOD AFTER MIDNIGHT THE NIGHT PRIOR TO SURGERY. NOTHING BY MOUTH EXCEPT CLEAR LIQUIDS UNTIL    0430am . PLEASE FINISH ENSURE DRINK PER SURGEON ORDER  WHICH NEEDS TO BE COMPLETED AT .0430am  Clear liquid diet on day of bowel prep.    CLEAR LIQUID DIET   Foods Allowed                                                                     Coffee and tea, regular and decaf                            Plain Jell-O any favor except red or purple                                            Fruit ices (not with fruit pulp)                                      Iced Popsicles                                    Carbonated beverages, regular and diet                                    Cranberry, grape and apple juices Sports drinks like Gatorade Lightly seasoned clear broth or consume(fat free) Sugar, honey syrup  _____________________________________________________________________    Take these medicines the morning of surgery with A SIP OF WATER:  Gabapentin, protonix                                 You may not have any metal on your body including hair pins and              piercings  Do not wear jewelry, make-up, lotions, powders or perfumes, deodorant             Do not wear nail polish on your fingernails.  Do not shave  48 hours prior to surgery.              Men may shave face and neck.   Do not bring valuables to the hospital. [facility] IS NOT             RESPONSIBLE   FOR VALUABLES.  Contacts, dentures or bridgework may not be worn into surgery.  Leave suitcase in the car. After surgery it may be brought to your room.     Patients discharged the day of surgery will not be allowed to drive home. IF YOU ARE HAVING SURGERY AND GOING HOME THE SAME DAY, YOU MUST HAVE AN ADULT TO DRIVE YOU HOME AND BE WITH YOU FOR 24 HOURS. YOU MAY GO HOME BY TAXI OR UBER OR ORTHERWISE, BUT AN ADULT MUST ACCOMPANY YOU HOME AND STAY WITH YOU FOR 24 HOURS.  Name and phone number of your driver:                Please read over the following fact sheets you were given: _____________________________________________________________________  [facility] - Preparing for Surgery Before surgery, you can play an important role.  Because skin is not sterile, your skin needs to be as free of germs as possible.  You can reduce the number of germs on your skin by washing with CHG (chlorahexidine gluconate) soap before surgery.  CHG is an antiseptic cleaner which kills germs and bonds with the skin to continue killing germs even after washing. Please DO NOT use if you have an allergy to CHG or antibacterial soaps.  If your skin becomes reddened/irritated stop using the CHG and inform your nurse when you arrive at Short Stay. Do not shave (including legs and underarms) for at least 48 hours prior to the first CHG  shower.  You may shave your face/neck. Please follow these instructions carefully:  1.  Shower with CHG Soap the night before surgery and the  morning of Surgery.  2.  If you choose to wash your hair, wash your hair first as usual with your  normal  shampoo.  3.  After you shampoo, rinse your hair and body thoroughly to remove the  shampoo.                           4.  Use CHG as you would any other liquid soap.  You can apply chg directly  to the skin and wash                       Gently with a scrungie or clean washcloth.  5.  Apply the CHG Soap to your body ONLY FROM THE NECK DOWN.   Do not use on face/ open  Wound or open sores. Avoid contact with eyes, ears mouth and genitals (private parts).                       Wash face,  Genitals (private parts) with your normal soap.             6.  Wash thoroughly, paying special attention to the area where your surgery  will be performed.  7.  Thoroughly rinse your body with warm water from the neck down.  8.  DO NOT shower/wash with your normal soap after using and rinsing off  the CHG Soap.                9.  Pat yourself dry with a clean towel.            10.  Wear clean pajamas.            11.  Place clean sheets on your bed the night of your first shower and do not  sleep with pets. Day of Surgery : Do not apply any lotions/deodorants the morning of surgery.  Please wear clean clothes to the hospital/surgery center.  FAILURE TO FOLLOW THESE INSTRUCTIONS MAY RESULT IN THE CANCELLATION OF YOUR SURGERY PATIENT SIGNATURE_________________________________  NURSE SIGNATURE__________________________________  ________________________________________________________________________   Adam Phenix  An incentive spirometer is a tool that can help keep your lungs clear and active. This tool measures how well you are filling your lungs with each breath. Taking long deep breaths may help reverse or decrease the chance  of developing breathing (pulmonary) problems (especially infection) following:  A long period of time when you are unable to move or be active. BEFORE THE PROCEDURE   If the spirometer includes an indicator to show your best effort, your nurse or respiratory therapist will set it to a desired goal.  If possible, sit up straight or lean slightly forward. Try not to slouch.  Hold the incentive spirometer in an upright position. INSTRUCTIONS FOR USE  1. Sit on the edge of your bed if possible, or sit up as far as you can in bed or on a chair. 2. Hold the incentive spirometer in an upright position. 3. Breathe out normally. 4. Place the mouthpiece in your mouth and seal your lips tightly around it. 5. Breathe in slowly and as deeply as possible, raising the piston or the ball toward the top of the column. 6. Hold your breath for 3-5 seconds or for as long as possible. Allow the piston or ball to fall to the bottom of the column. 7. Remove the mouthpiece from your mouth and breathe out normally. 8. Rest for a few seconds and repeat Steps 1 through 7 at least 10 times every 1-2 hours when you are awake. Take your time and take a few normal breaths between deep breaths. 9. The spirometer may include an indicator to show your best effort. Use the indicator as a goal to work toward during each repetition. 10. After each set of 10 deep breaths, practice coughing to be sure your lungs are clear. If you have an incision (the cut made at the time of surgery), support your incision when coughing by placing a pillow or rolled up towels firmly against it. Once you are able to get out of bed, walk around indoors and cough well. You may stop using the incentive spirometer when instructed by your caregiver.  RISKS AND COMPLICATIONS  Take your time so you do not get  dizzy or light-headed.  If you are in pain, you may need to take or ask for pain medication before doing incentive spirometry. It is harder to  take a deep breath if you are having pain. AFTER USE  Rest and breathe slowly and easily.  It can be helpful to keep track of a log of your progress. Your caregiver can provide you with a simple table to help with this. If you are using the spirometer at home, follow these instructions: SEEK MEDICAL CARE IF:   You are having difficultly using the spirometer.  You have trouble using the spirometer as often as instructed.  Your pain medication is not giving enough relief while using the spirometer.  You develop fever of 100.5 F (38.1 C) or higher. SEEK IMMEDIATE MEDICAL CARE IF:   You cough up bloody sputum that had not been present before.  You develop fever of 102 F (38.9 C) or greater.  You develop worsening pain at or near the incision site. MAKE SURE YOU:   Understand these instructions.  Will watch your condition.  Will get help right away if you are not doing well or get worse. Document Released: 11/17/2006 Document Revised: 09/29/2011 Document Reviewed: 01/18/2007 [facility] Patient Information 2014 [place], [place].   ________________________________________________________________________

## 2020-02-29 ENCOUNTER — Encounter (HOSPITAL_COMMUNITY)
Admission: RE | Admit: 2020-02-29 | Discharge: 2020-02-29 | Disposition: A | Discharge status: Home / Self Care | Payer: PPO | Source: Ambulatory Visit | Attending: General Surgery | Admitting: General Surgery

## 2020-02-29 ENCOUNTER — Other Ambulatory Visit: Payer: Self-pay

## 2020-02-29 ENCOUNTER — Encounter (HOSPITAL_COMMUNITY): Payer: Self-pay

## 2020-02-29 DIAGNOSIS — Z01812 Encounter for preprocedural laboratory examination: Secondary | ICD-10-CM | POA: Diagnosis not present

## 2020-02-29 HISTORY — DX: Cognitive communication deficit: R41.841

## 2020-02-29 LAB — CBC
HCT: 39.2 % (ref 36.0–46.0)
Hemoglobin: 13.1 g/dL (ref 12.0–15.0)
MCH: 31.6 pg (ref 26.0–34.0)
MCHC: 33.4 g/dL (ref 30.0–36.0)
MCV: 94.7 fL (ref 80.0–100.0)
Platelets: 226 10*3/uL (ref 150–400)
RBC: 4.14 MIL/uL (ref 3.87–5.11)
RDW: 13.5 % (ref 11.5–15.5)
WBC: 7.9 10*3/uL (ref 4.0–10.5)
nRBC: 0 % (ref 0.0–0.2)

## 2020-02-29 LAB — BASIC METABOLIC PANEL
Anion gap: 9 (ref 5–15)
BUN: 10 mg/dL (ref 8–23)
CO2: 25 mmol/L (ref 22–32)
Calcium: 8.8 mg/dL — ABNORMAL LOW (ref 8.9–10.3)
Chloride: 98 mmol/L (ref 98–111)
Creatinine, Ser: 0.45 mg/dL (ref 0.44–1.00)
GFR calc Af Amer: >60 mL/min (ref >60)
GFR calc non Af Amer: >60 mL/min (ref >60)
Glucose, Bld: 94 mg/dL (ref 70–99)
Potassium: 4.3 mmol/L (ref 3.5–5.1)
Sodium: 132 mmol/L — ABNORMAL LOW (ref 135–145)

## 2020-02-29 NOTE — Progress Notes (Addendum)
Called and spoke with husband at home to make sure pt had bowel prep instructions from DR [name].  Husband verified that patient had bowel prep instructions from office of Dr Maisie Fus .  Husband stated that on the preop instructions from Dr Maisie Fus it stated 2 Ensure drinks the nite before and one the am of surgery.  In orders in epic it states for one drink am of surgery.  Infromed husband that I would call office of DR Maisie Fus for clarification .  Spoke with Helmut Muster in Triage at [facility] and Helmut Muster spoke with Tresa Endo, assistant to Dr Maisie Fus and Helmut Muster stated per Tresa Endo that order should read 2 drinks the nite before and 1 Ensure drink the am of surgery. Asked for clarified order to be placed in epic. Helmut Muster stated she would inform Dr Maisie Fus.  Nurse informed Helmut Muster that I would call husband and have him come by [facility] to Admitting to pick up 2 Ensure drinks.  Husband to pick up to Ensure drinks on 03/06/20.

## 2020-03-06 ENCOUNTER — Other Ambulatory Visit (HOSPITAL_COMMUNITY)
Admission: RE | Admit: 2020-03-06 | Discharge: 2020-03-06 | Disposition: A | Discharge status: Home / Self Care | Payer: PPO | Source: Ambulatory Visit | Attending: General Surgery | Admitting: General Surgery

## 2020-03-06 DIAGNOSIS — Z408 Encounter for other prophylactic surgery: Secondary | ICD-10-CM | POA: Diagnosis not present

## 2020-03-06 DIAGNOSIS — Z79899 Other long term (current) drug therapy: Secondary | ICD-10-CM | POA: Diagnosis not present

## 2020-03-06 DIAGNOSIS — Z87891 Personal history of nicotine dependence: Secondary | ICD-10-CM | POA: Diagnosis not present

## 2020-03-06 DIAGNOSIS — I10 Essential (primary) hypertension: Secondary | ICD-10-CM | POA: Diagnosis present

## 2020-03-06 DIAGNOSIS — K632 Fistula of intestine: Secondary | ICD-10-CM | POA: Diagnosis present

## 2020-03-06 DIAGNOSIS — F0390 Unspecified dementia without behavioral disturbance: Secondary | ICD-10-CM | POA: Diagnosis present

## 2020-03-06 DIAGNOSIS — F419 Anxiety disorder, unspecified: Secondary | ICD-10-CM | POA: Diagnosis present

## 2020-03-06 DIAGNOSIS — E871 Hypo-osmolality and hyponatremia: Secondary | ICD-10-CM | POA: Diagnosis not present

## 2020-03-06 DIAGNOSIS — Z20822 Contact with and (suspected) exposure to covid-19: Secondary | ICD-10-CM | POA: Diagnosis present

## 2020-03-06 DIAGNOSIS — K567 Ileus, unspecified: Secondary | ICD-10-CM | POA: Diagnosis not present

## 2020-03-06 DIAGNOSIS — M810 Age-related osteoporosis without current pathological fracture: Secondary | ICD-10-CM | POA: Diagnosis present

## 2020-03-06 DIAGNOSIS — Z01812 Encounter for preprocedural laboratory examination: Secondary | ICD-10-CM | POA: Insufficient documentation

## 2020-03-06 DIAGNOSIS — K5720 Diverticulitis of large intestine with perforation and abscess without bleeding: Secondary | ICD-10-CM | POA: Diagnosis present

## 2020-03-06 DIAGNOSIS — Z882 Allergy status to sulfonamides status: Secondary | ICD-10-CM | POA: Diagnosis not present

## 2020-03-06 DIAGNOSIS — K219 Gastro-esophageal reflux disease without esophagitis: Secondary | ICD-10-CM | POA: Diagnosis present

## 2020-03-06 DIAGNOSIS — K5790 Diverticulosis of intestine, part unspecified, without perforation or abscess without bleeding: Secondary | ICD-10-CM | POA: Diagnosis not present

## 2020-03-06 LAB — SARS CORONAVIRUS 2 (TAT 6-24 HRS): SARS Coronavirus 2: NEGATIVE

## 2020-03-08 MED ORDER — BUPIVACAINE LIPOSOME 1.3 % IJ SUSP
20.0000 mL | Freq: Once | INTRAMUSCULAR | Status: DC
  Filled 2020-03-08: qty 20

## 2020-03-09 ENCOUNTER — Encounter (HOSPITAL_COMMUNITY)
Admission: RE | Disposition: A | Discharge status: Home / Self Care | Payer: Self-pay | Source: Home / Self Care | Attending: General Surgery

## 2020-03-09 ENCOUNTER — Other Ambulatory Visit: Payer: Self-pay

## 2020-03-09 ENCOUNTER — Encounter (HOSPITAL_COMMUNITY): Payer: Self-pay | Admitting: General Surgery

## 2020-03-09 ENCOUNTER — Inpatient Hospital Stay (HOSPITAL_COMMUNITY)
Admission: RE | Admit: 2020-03-09 | Discharge: 2020-03-14 | DRG: 330 | Disposition: A | Discharge status: Home / Self Care | Payer: PPO | Attending: General Surgery | Admitting: General Surgery

## 2020-03-09 ENCOUNTER — Inpatient Hospital Stay (HOSPITAL_COMMUNITY): Payer: PPO | Admitting: Anesthesiology

## 2020-03-09 DIAGNOSIS — M810 Age-related osteoporosis without current pathological fracture: Secondary | ICD-10-CM | POA: Diagnosis present

## 2020-03-09 DIAGNOSIS — Z87891 Personal history of nicotine dependence: Secondary | ICD-10-CM | POA: Diagnosis not present

## 2020-03-09 DIAGNOSIS — Z20822 Contact with and (suspected) exposure to covid-19: Secondary | ICD-10-CM | POA: Diagnosis present

## 2020-03-09 DIAGNOSIS — K632 Fistula of intestine: Secondary | ICD-10-CM | POA: Diagnosis present

## 2020-03-09 DIAGNOSIS — I10 Essential (primary) hypertension: Secondary | ICD-10-CM | POA: Diagnosis present

## 2020-03-09 DIAGNOSIS — K5720 Diverticulitis of large intestine with perforation and abscess without bleeding: Secondary | ICD-10-CM | POA: Diagnosis present

## 2020-03-09 DIAGNOSIS — K567 Ileus, unspecified: Secondary | ICD-10-CM | POA: Diagnosis not present

## 2020-03-09 DIAGNOSIS — F0390 Unspecified dementia without behavioral disturbance: Secondary | ICD-10-CM | POA: Diagnosis present

## 2020-03-09 DIAGNOSIS — Z79899 Other long term (current) drug therapy: Secondary | ICD-10-CM

## 2020-03-09 DIAGNOSIS — F419 Anxiety disorder, unspecified: Secondary | ICD-10-CM | POA: Diagnosis present

## 2020-03-09 DIAGNOSIS — K5790 Diverticulosis of intestine, part unspecified, without perforation or abscess without bleeding: Secondary | ICD-10-CM | POA: Diagnosis present

## 2020-03-09 DIAGNOSIS — K219 Gastro-esophageal reflux disease without esophagitis: Secondary | ICD-10-CM | POA: Diagnosis present

## 2020-03-09 DIAGNOSIS — Z882 Allergy status to sulfonamides status: Secondary | ICD-10-CM

## 2020-03-09 HISTORY — PX: COLECTOMY: SHX59

## 2020-03-09 HISTORY — DX: Unspecified osteoarthritis, unspecified site: M19.90

## 2020-03-09 HISTORY — DX: Depression, unspecified: F32.A

## 2020-03-09 HISTORY — PX: CYSTOSCOPY WITH STENT PLACEMENT: SHX5790

## 2020-03-09 LAB — TYPE AND SCREEN
ABO/RH(D): O POS
Antibody Screen: NEGATIVE

## 2020-03-09 SURGERY — COLECTOMY, TOTAL
Anesthesia: General

## 2020-03-09 MED ORDER — ENSURE PRE-SURGERY PO LIQD
296.0000 mL | Freq: Once | ORAL | Status: DC
  Filled 2020-03-09: qty 296

## 2020-03-09 MED ORDER — LIDOCAINE 2% (20 MG/ML) 5 ML SYRINGE
INTRAMUSCULAR | Status: AC
  Filled 2020-03-09: qty 5

## 2020-03-09 MED ORDER — GABAPENTIN 100 MG PO CAPS
100.0000 mg | ORAL_CAPSULE | Freq: Two times a day (BID) | ORAL | Status: DC
  Administered 2020-03-09 – 2020-03-14 (×10): 100 mg via ORAL
  Filled 2020-03-09 (×10): qty 1

## 2020-03-09 MED ORDER — ACETAMINOPHEN 500 MG PO TABS
1000.0000 mg | ORAL_TABLET | Freq: Four times a day (QID) | ORAL | Status: DC
  Administered 2020-03-09 – 2020-03-14 (×15): 1000 mg via ORAL
  Filled 2020-03-09 (×17): qty 2

## 2020-03-09 MED ORDER — GABAPENTIN 300 MG PO CAPS
300.0000 mg | ORAL_CAPSULE | ORAL | Status: AC
  Administered 2020-03-09: 300 mg via ORAL
  Filled 2020-03-09: qty 1

## 2020-03-09 MED ORDER — LIDOCAINE HCL 2 % IJ SOLN
INTRAMUSCULAR | Status: AC
  Filled 2020-03-09: qty 20

## 2020-03-09 MED ORDER — ENOXAPARIN SODIUM 40 MG/0.4ML ~~LOC~~ SOLN
40.0000 mg | SUBCUTANEOUS | Status: DC
  Administered 2020-03-10 – 2020-03-14 (×5): 40 mg via SUBCUTANEOUS
  Filled 2020-03-09 (×5): qty 0.4

## 2020-03-09 MED ORDER — HYDROXYZINE HCL 10 MG PO TABS
10.0000 mg | ORAL_TABLET | Freq: Four times a day (QID) | ORAL | Status: DC | PRN
  Filled 2020-03-09: qty 1

## 2020-03-09 MED ORDER — SODIUM CHLORIDE 0.9 % IV SOLN
2.0000 g | INTRAVENOUS | Status: AC
  Administered 2020-03-09: 2 g via INTRAVENOUS
  Filled 2020-03-09: qty 2

## 2020-03-09 MED ORDER — EPHEDRINE SULFATE 50 MG/ML IJ SOLN
INTRAMUSCULAR | Status: DC | PRN
  Administered 2020-03-09: 10 mg via INTRAVENOUS
  Administered 2020-03-09: 5 mg via INTRAVENOUS

## 2020-03-09 MED ORDER — EPHEDRINE 5 MG/ML INJ
INTRAVENOUS | Status: AC
  Filled 2020-03-09: qty 10

## 2020-03-09 MED ORDER — PROPOFOL 10 MG/ML IV BOLUS
INTRAVENOUS | Status: AC
  Filled 2020-03-09: qty 20

## 2020-03-09 MED ORDER — ALUM & MAG HYDROXIDE-SIMETH 200-200-20 MG/5ML PO SUSP
30.0000 mL | Freq: Four times a day (QID) | ORAL | Status: DC | PRN
  Administered 2020-03-11: 30 mL via ORAL
  Filled 2020-03-09: qty 30

## 2020-03-09 MED ORDER — DEXAMETHASONE SODIUM PHOSPHATE 10 MG/ML IJ SOLN
INTRAMUSCULAR | Status: AC
  Filled 2020-03-09: qty 1

## 2020-03-09 MED ORDER — BUPIVACAINE-EPINEPHRINE 0.25% -1:200000 IJ SOLN
INTRAMUSCULAR | Status: DC | PRN
  Administered 2020-03-09: 30 mL

## 2020-03-09 MED ORDER — KETAMINE HCL 10 MG/ML IJ SOLN
INTRAMUSCULAR | Status: AC
  Filled 2020-03-09: qty 1

## 2020-03-09 MED ORDER — ONDANSETRON HCL 4 MG PO TABS
4.0000 mg | ORAL_TABLET | Freq: Four times a day (QID) | ORAL | Status: DC | PRN
  Administered 2020-03-10 – 2020-03-11 (×2): 4 mg via ORAL
  Filled 2020-03-09 (×2): qty 1

## 2020-03-09 MED ORDER — ALVIMOPAN 12 MG PO CAPS
12.0000 mg | ORAL_CAPSULE | Freq: Two times a day (BID) | ORAL | Status: DC
  Administered 2020-03-10 – 2020-03-13 (×7): 12 mg via ORAL
  Filled 2020-03-09 (×7): qty 1

## 2020-03-09 MED ORDER — STERILE WATER FOR IRRIGATION IR SOLN
Status: DC | PRN
  Administered 2020-03-09: 3000 mL

## 2020-03-09 MED ORDER — CHLORHEXIDINE GLUCONATE 0.12 % MT SOLN
15.0000 mL | Freq: Once | OROMUCOSAL | Status: AC
  Administered 2020-03-09: 15 mL via OROMUCOSAL

## 2020-03-09 MED ORDER — LIDOCAINE 2% (20 MG/ML) 5 ML SYRINGE
INTRAMUSCULAR | Status: DC | PRN
  Administered 2020-03-09: 60 mg via INTRAVENOUS

## 2020-03-09 MED ORDER — ALVIMOPAN 12 MG PO CAPS
12.0000 mg | ORAL_CAPSULE | ORAL | Status: AC
  Administered 2020-03-09: 12 mg via ORAL
  Filled 2020-03-09: qty 1

## 2020-03-09 MED ORDER — SUGAMMADEX SODIUM 200 MG/2ML IV SOLN
INTRAVENOUS | Status: DC | PRN
  Administered 2020-03-09: 100 mg via INTRAVENOUS

## 2020-03-09 MED ORDER — KETAMINE HCL 10 MG/ML IJ SOLN
INTRAMUSCULAR | Status: DC | PRN
  Administered 2020-03-09 (×3): 10 mg via INTRAVENOUS

## 2020-03-09 MED ORDER — MIDAZOLAM HCL 2 MG/2ML IJ SOLN
INTRAMUSCULAR | Status: AC
  Filled 2020-03-09: qty 2

## 2020-03-09 MED ORDER — ONDANSETRON HCL 4 MG/2ML IJ SOLN
4.0000 mg | Freq: Four times a day (QID) | INTRAMUSCULAR | Status: DC | PRN
  Administered 2020-03-09 – 2020-03-11 (×4): 4 mg via INTRAVENOUS
  Filled 2020-03-09 (×4): qty 2

## 2020-03-09 MED ORDER — SIMETHICONE 80 MG PO CHEW
40.0000 mg | CHEWABLE_TABLET | Freq: Four times a day (QID) | ORAL | Status: DC | PRN
  Administered 2020-03-10 – 2020-03-11 (×3): 40 mg via ORAL
  Filled 2020-03-09 (×3): qty 1

## 2020-03-09 MED ORDER — ONDANSETRON HCL 4 MG/2ML IJ SOLN
INTRAMUSCULAR | Status: DC | PRN
  Administered 2020-03-09: 4 mg via INTRAVENOUS

## 2020-03-09 MED ORDER — DEXAMETHASONE SODIUM PHOSPHATE 10 MG/ML IJ SOLN
INTRAMUSCULAR | Status: DC | PRN
  Administered 2020-03-09: 5 mg via INTRAVENOUS

## 2020-03-09 MED ORDER — MORPHINE SULFATE (PF) 2 MG/ML IV SOLN
2.0000 mg | INTRAVENOUS | Status: DC | PRN
  Administered 2020-03-09 – 2020-03-13 (×18): 2 mg via INTRAVENOUS
  Filled 2020-03-09 (×18): qty 1

## 2020-03-09 MED ORDER — CHLORHEXIDINE GLUCONATE CLOTH 2 % EX PADS
6.0000 | MEDICATED_PAD | Freq: Every day | CUTANEOUS | Status: DC
  Administered 2020-03-10 – 2020-03-14 (×2): 6 via TOPICAL

## 2020-03-09 MED ORDER — FENTANYL CITRATE (PF) 100 MCG/2ML IJ SOLN: 25.0000 ug | INTRAMUSCULAR | Status: DC | PRN

## 2020-03-09 MED ORDER — ENSURE PRE-SURGERY PO LIQD
592.0000 mL | Freq: Once | ORAL | Status: DC
  Filled 2020-03-09: qty 592

## 2020-03-09 MED ORDER — LIDOCAINE 2% (20 MG/ML) 5 ML SYRINGE
INTRAMUSCULAR | Status: DC | PRN
  Administered 2020-03-09: 1 mg/kg/h via INTRAVENOUS

## 2020-03-09 MED ORDER — FENTANYL CITRATE (PF) 250 MCG/5ML IJ SOLN
INTRAMUSCULAR | Status: DC | PRN
  Administered 2020-03-09: 50 ug via INTRAVENOUS
  Administered 2020-03-09 (×2): 25 ug via INTRAVENOUS
  Administered 2020-03-09 (×3): 50 ug via INTRAVENOUS

## 2020-03-09 MED ORDER — KCL IN DEXTROSE-NACL 20-5-0.45 MEQ/L-%-% IV SOLN
INTRAVENOUS | Status: DC
  Filled 2020-03-09 (×3): qty 1000

## 2020-03-09 MED ORDER — FENTANYL CITRATE (PF) 250 MCG/5ML IJ SOLN
INTRAMUSCULAR | Status: AC
  Filled 2020-03-09: qty 5

## 2020-03-09 MED ORDER — PROPOFOL 10 MG/ML IV BOLUS
INTRAVENOUS | Status: DC | PRN
  Administered 2020-03-09: 90 mg via INTRAVENOUS

## 2020-03-09 MED ORDER — PROCHLORPERAZINE EDISYLATE 10 MG/2ML IJ SOLN
10.0000 mg | Freq: Four times a day (QID) | INTRAMUSCULAR | Status: DC | PRN
  Administered 2020-03-09 – 2020-03-11 (×4): 10 mg via INTRAVENOUS
  Filled 2020-03-09 (×4): qty 2

## 2020-03-09 MED ORDER — BUPIVACAINE LIPOSOME 1.3 % IJ SUSP
INTRAMUSCULAR | Status: DC | PRN
  Administered 2020-03-09: 20 mL

## 2020-03-09 MED ORDER — BUPIVACAINE-EPINEPHRINE (PF) 0.25% -1:200000 IJ SOLN
INTRAMUSCULAR | Status: AC
  Filled 2020-03-09: qty 30

## 2020-03-09 MED ORDER — SODIUM CHLORIDE 0.9 % IV SOLN
2.0000 g | Freq: Two times a day (BID) | INTRAVENOUS | Status: AC
  Administered 2020-03-09: 21:00:00 2 g via INTRAVENOUS
  Filled 2020-03-09: qty 2

## 2020-03-09 MED ORDER — ONDANSETRON HCL 4 MG/2ML IJ SOLN
INTRAMUSCULAR | Status: AC
  Filled 2020-03-09: qty 2

## 2020-03-09 MED ORDER — SACCHAROMYCES BOULARDII 250 MG PO CAPS
250.0000 mg | ORAL_CAPSULE | Freq: Two times a day (BID) | ORAL | Status: DC
  Administered 2020-03-09 – 2020-03-14 (×11): 250 mg via ORAL
  Filled 2020-03-09 (×10): qty 1

## 2020-03-09 MED ORDER — ORAL CARE MOUTH RINSE: 15.0000 mL | Freq: Once | OROMUCOSAL | Status: AC

## 2020-03-09 MED ORDER — MIDAZOLAM HCL 2 MG/2ML IJ SOLN
INTRAMUSCULAR | Status: DC | PRN
  Administered 2020-03-09: 2 mg via INTRAVENOUS

## 2020-03-09 MED ORDER — ROCURONIUM BROMIDE 10 MG/ML (PF) SYRINGE
PREFILLED_SYRINGE | INTRAVENOUS | Status: DC | PRN
  Administered 2020-03-09: 20 mg via INTRAVENOUS
  Administered 2020-03-09: 50 mg via INTRAVENOUS

## 2020-03-09 MED ORDER — ROCURONIUM BROMIDE 10 MG/ML (PF) SYRINGE
PREFILLED_SYRINGE | INTRAVENOUS | Status: AC
  Filled 2020-03-09: qty 10

## 2020-03-09 MED ORDER — 0.9 % SODIUM CHLORIDE (POUR BTL) OPTIME
TOPICAL | Status: DC | PRN
  Administered 2020-03-09: 1000 mL

## 2020-03-09 MED ORDER — LACTATED RINGERS IV SOLN: INTRAVENOUS | Status: DC

## 2020-03-09 MED ORDER — ACETAMINOPHEN 500 MG PO TABS
1000.0000 mg | ORAL_TABLET | ORAL | Status: AC
  Administered 2020-03-09: 1000 mg via ORAL
  Filled 2020-03-09: qty 2

## 2020-03-09 SURGICAL SUPPLY — 64 items
ADAPTER GOLDBERG URETERAL (ADAPTER) ×4 IMPLANT
BAG URO CATCHER STRL LF (MISCELLANEOUS) ×4 IMPLANT
BLADE EXTENDED COATED 6.5IN (ELECTRODE) IMPLANT
CATH INTERMIT  6FR 70CM (CATHETERS) ×8 IMPLANT
CELLS DAT CNTRL 66122 CELL SVR (MISCELLANEOUS) ×2 IMPLANT
CHLORAPREP W/TINT 26 (MISCELLANEOUS) ×4 IMPLANT
CLOTH BEACON ORANGE TIMEOUT ST (SAFETY) ×4 IMPLANT
COUNTER NEEDLE 20 DBL MAG RED (NEEDLE) ×4 IMPLANT
COVER MAYO STAND STRL (DRAPES) ×12 IMPLANT
COVER WAND RF STERILE (DRAPES) IMPLANT
DECANTER SPIKE VIAL GLASS SM (MISCELLANEOUS) ×4 IMPLANT
DERMABOND ADVANCED (GAUZE/BANDAGES/DRESSINGS) ×2
DERMABOND ADVANCED .7 DNX12 (GAUZE/BANDAGES/DRESSINGS) ×2 IMPLANT
DRAIN CHANNEL 19F RND (DRAIN) IMPLANT
DRAPE LAPAROSCOPIC ABDOMINAL (DRAPES) ×4 IMPLANT
DRSG OPSITE POSTOP 4X10 (GAUZE/BANDAGES/DRESSINGS) IMPLANT
DRSG OPSITE POSTOP 4X6 (GAUZE/BANDAGES/DRESSINGS) ×4 IMPLANT
DRSG OPSITE POSTOP 4X8 (GAUZE/BANDAGES/DRESSINGS) IMPLANT
ELECT REM PT RETURN 15FT ADLT (MISCELLANEOUS) ×4 IMPLANT
EVACUATOR SILICONE 100CC (DRAIN) IMPLANT
GAUZE SPONGE 4X4 12PLY STRL (GAUZE/BANDAGES/DRESSINGS) IMPLANT
GLOVE BIO SURGEON STRL SZ 6.5 (GLOVE) ×6 IMPLANT
GLOVE BIO SURGEON STRL SZ7.5 (GLOVE) ×4 IMPLANT
GLOVE BIO SURGEONS STRL SZ 6.5 (GLOVE) ×2
GLOVE BIOGEL PI IND STRL 7.0 (GLOVE) ×4 IMPLANT
GLOVE BIOGEL PI INDICATOR 7.0 (GLOVE) ×4
GOWN STRL REUS W/TWL LRG LVL3 (GOWN DISPOSABLE) ×8 IMPLANT
GOWN STRL REUS W/TWL XL LVL3 (GOWN DISPOSABLE) ×28 IMPLANT
GUIDEWIRE STR DUAL SENSOR (WIRE) ×4 IMPLANT
KIT SIGMOIDOSCOPE (SET/KITS/TRAYS/PACK) ×4 IMPLANT
KIT TURNOVER KIT A (KITS) IMPLANT
LEGGING LITHOTOMY PAIR STRL (DRAPES) IMPLANT
LIGASURE IMPACT 36 18CM CVD LR (INSTRUMENTS) ×4 IMPLANT
MANIFOLD NEPTUNE II (INSTRUMENTS) ×4 IMPLANT
PACK COLON (CUSTOM PROCEDURE TRAY) ×4 IMPLANT
PACK CYSTO (CUSTOM PROCEDURE TRAY) ×4 IMPLANT
PAD POSITIONING PINK XL (MISCELLANEOUS) ×4 IMPLANT
PENCIL SMOKE EVACUATOR (MISCELLANEOUS) IMPLANT
PROTECTOR NERVE ULNAR (MISCELLANEOUS) ×4 IMPLANT
RTRCTR WOUND ALEXIS 18CM MED (MISCELLANEOUS) ×4
SEALER TISSUE G2 STRG ARTC 35C (ENDOMECHANICALS) IMPLANT
SPONGE LAP 18X18 RF (DISPOSABLE) ×4 IMPLANT
STAPLER CUT CVD 40MM BLUE (STAPLE) ×4 IMPLANT
STAPLER CUT RELOAD GREEN (STAPLE) ×4 IMPLANT
STAPLER VISISTAT 35W (STAPLE) ×4 IMPLANT
SURGILUBE 2OZ TUBE FLIPTOP (MISCELLANEOUS) ×4 IMPLANT
SUT ETHILON 2 0 PS N (SUTURE) IMPLANT
SUT NOVA NAB GS-21 0 18 T12 DT (SUTURE) ×8 IMPLANT
SUT PDS AB 1 CTX 36 (SUTURE) ×8 IMPLANT
SUT PDS AB 1 TP1 96 (SUTURE) IMPLANT
SUT PROLENE 2 0 KS (SUTURE) ×4 IMPLANT
SUT SILK 2 0 (SUTURE) ×4
SUT SILK 2 0 SH CR/8 (SUTURE) ×4 IMPLANT
SUT SILK 2-0 18XBRD TIE 12 (SUTURE) ×2 IMPLANT
SUT SILK 3 0 (SUTURE) ×4
SUT SILK 3 0 SH CR/8 (SUTURE) ×4 IMPLANT
SUT SILK 3-0 18XBRD TIE 12 (SUTURE) ×2 IMPLANT
SUT VIC AB 2-0 SH 18 (SUTURE) ×4 IMPLANT
SUT VIC AB 4-0 PS2 27 (SUTURE) ×8 IMPLANT
TOWEL OR NON WOVEN STRL DISP B (DISPOSABLE) ×4 IMPLANT
TRAY FOLEY MTR SLVR 16FR STAT (SET/KITS/TRAYS/PACK) IMPLANT
TUBING CONNECTING 10 (TUBING) ×6 IMPLANT
TUBING CONNECTING 10' (TUBING) ×2
TUBING UROLOGY SET (TUBING) IMPLANT

## 2020-03-09 NOTE — Plan of Care (Signed)
  Problem: Education: Goal: Required Educational Video(s) Outcome: Progressing   Problem: Clinical Measurements: Goal: Ability to maintain clinical measurements within normal limits will improve Outcome: Progressing Goal: Postoperative complications will be avoided or minimized Outcome: Progressing   Problem: Skin Integrity: Goal: Demonstration of wound healing without infection will improve Outcome: Progressing   

## 2020-03-09 NOTE — H&P (Signed)
H&P Physician requesting consult: Leighton Ruff  Chief Complaint: Recurrent diverticulitis  History of Present Illness: 69 year old female with a history of diverticulitis with a persistent fistulous tract to the colon.  She has a history of exploratory laparotomy.  She was recently admitted in May 2021 with recurrent diverticulitis and a 14 cm abscess that was drained.  She presents for open sigmoidectomy.  Ureteral stent placement prior to the surgery as requested.  Past Medical History:  Diagnosis Date  . Alcoholism ([place]) 02/02/2009  . ANXIETY 01/21/2007  . Arthritis   . Blood transfusion    hx of 2005   . Cognitive communication disorder    per husband has some cognitive issues   . Depression   . Diverticulitis   . Diverticulitis of large intestine with abscess 04/05/2019  . DIVERTICULOSIS 07/09/2007  . Duodenal ulcer hemorrhage perforated 2006  . GERD (gastroesophageal reflux disease)   . Headache(784.0) 05/31/2007   pt denies at 08/12/11 preop visit   . HYPERTENSION 06/23/2007  . HYPONATREMIA 02/02/2009   better off diuretic  . INSOMNIA 05/31/2007  . Iron deficiency anemia    pt denies at 08/12/11 visit   . Marginal ulcer, at gastrojejunostomy, resected 08/14/2011. 09/19/2011  . NSAID-induced duodenal ulcer   . OSTEOPOROSIS 01/21/2007  . Raynaud's syndrome 06/23/2007  . Recurrent upper respiratory infection (URI)    pt reports slight cold using Nyquil prn at bedtime   . Urinary tract infection 11/23/2019  . VITAMIN B12 DEFICIENCY 10/02/2009  . Zoster 2012   twice - left groin/vagina   Past Surgical History:  Procedure Laterality Date  . COLONOSCOPY  06/18/2007   angulated and stenotic sigmoid colon with severe sigmoid diverticulosis  . COLONOSCOPY WITH PROPOFOL N/A 01/26/2020   Procedure: COLONOSCOPY WITH PROPOFOL;  Surgeon: Milus Banister, MD;  Location: [facility];  Service: Endoscopy;  Laterality: N/A;  ultraslim scope  . EXPLORATORY LAPAROTOMY  08/03/2004   1) duodenal ulcer  oversew, pyloroplasty, anterior, posterior and truncal vagotomies  . EXPLORATORY LAPAROTOMY  10/20/2004   1) duodenal ulcer oversew  with exclusion2) side-side gastrojejunostomy 3) feeding jejunostomy  . IR RADIOLOGIST EVAL & MGMT  04/26/2019  . IR RADIOLOGIST EVAL & MGMT  05/10/2019  . IR RADIOLOGIST EVAL & MGMT  06/07/2019  . IR RADIOLOGIST EVAL & MGMT  12/06/2019  . IR RADIOLOGIST EVAL & MGMT  12/22/2019  . IR RADIOLOGIST EVAL & MGMT  01/10/2020  . IR RADIOLOGIST EVAL & MGMT  01/31/2020  . IR SINUS/FIST TUBE CHK-NON GI  04/12/2019  . LAPAROSCOPY  08/14/2011   Procedure: LAPAROSCOPY DIAGNOSTIC;  Surgeon: Shann Medal, MD;  Location: [facility];  Service: General;  Laterality: N/A;  . LAPAROTOMY  08/14/2011   Procedure: EXPLORATORY LAPAROTOMY;  Surgeon: Shann Medal, MD;  Location: [facility];  Service: General;  Laterality: N/A;  exploratory laparotomy, lysis of adhesions, revision gastrojejunostomy, upper endoscopy  . OPEN REDUCTION INTERNAL FIXATION (ORIF) DISTAL RADIAL FRACTURE Right 05/01/2016   Procedure: OPEN REDUCTION INTERNAL FIXATION (ORIF) DISTAL RADIAL FRACTURE, right;  Surgeon: Leanora Cover, MD;  Location: [place];  Service: Orthopedics;  Laterality: Right;  . TUBAL LIGATION  1977  . UPPER GASTROINTESTINAL ENDOSCOPY  09/16/2007; 12/21/2008; 03/17/2011   2009: Normal 2010: Normal with enteroscopy2012: Gastrojejunal  anastomotic ulcer, gastric retention    Home Medications:  Medications Prior to Admission  Medication Sig Dispense Refill Last Dose  . acetaminophen (TYLENOL) 500 MG tablet Take 500 mg by mouth every 6 (six) hours as needed.  Past Week at Unknown time  . calcium carbonate (OS-CAL - DOSED IN MG OF ELEMENTAL CALCIUM) 1250 (500 Ca) MG tablet Take 1 tablet by mouth daily.    Past Week at Unknown time  . folic acid (FOLVITE) 1 MG tablet Take 1 tablet (1 mg total) by mouth daily. (Patient taking differently: Take 1 mg by mouth every morning. ) 30 tablet 11 Past Week at  Unknown time  . gabapentin (NEURONTIN) 100 MG capsule TAKE 1 CAPSULE BY MOUTH TWICE A DAY (Patient taking differently: Take 100 mg by mouth 2 (two) times daily. ) 180 capsule 1 Past Week at Unknown time  . metroNIDAZOLE (FLAGYL) 500 MG tablet Take 1,000 mg by mouth 3 (three) times daily.   03/08/2020 at Unknown time  . Multiple Vitamin (MULTIVITAMIN WITH MINERALS) TABS tablet Take 1 tablet by mouth daily. Centrum Silver   Past Week at Unknown time  . neomycin (MYCIFRADIN) 500 MG tablet Take 1,000 mg by mouth in the morning, at noon, and at bedtime.   03/08/2020 at Unknown time  . pantoprazole (PROTONIX) 40 MG tablet TAKE 1 TABLET BY MOUTH EVERY DAY (Patient taking differently: Take 40 mg by mouth every morning. ) 90 tablet 1 Past Week at Unknown time  . PEG-KCl-NaCl-NaSulf-Na Asc-C (PLENVU) 140 g SOLR Take 1 kit by mouth as directed. 1 each 0 Past Month at Unknown time  . thiamine 100 MG tablet Take 1 tablet (100 mg total) by mouth daily. (Patient taking differently: Take 100 mg by mouth every morning. ) 30 tablet 2 Past Week at Unknown time  . denosumab (PROLIA) 60 MG/ML SOSY injection Inject 60 mg into the skin every 6 (six) months.   More than a month at Unknown time   Allergies:  Allergies  Allergen Reactions  . Sulfa Antibiotics Anaphylaxis and Rash  . Bee Venom Swelling  . Dilaudid [Hydromorphone Hcl] Hives  . Penicillins Other (See Comments)    Has patient had a PCN reaction causing immediate rash, facial/tongue/throat swelling, SOB or lightheadedness with hypotension: No Has patient had a PCN reaction causing severe rash involving mucus membranes or skin necrosis: No Has patient had a PCN reaction that required hospitalization No Has patient had a PCN reaction occurring within the last 10 years: No If all of the above answers are "NO", then may proceed with Cephalosporin use.    Family History  Problem Relation Age of Onset  . Stroke Father   . Colon cancer Neg Hx   . Esophageal  cancer Neg Hx   . Pancreatic cancer Neg Hx   . Liver disease Neg Hx   . Colon polyps Neg Hx    Social History:  reports that she quit smoking about 5 years ago. She has never used smokeless tobacco. She reports previous alcohol use of about 7.0 standard drinks of alcohol per week. She reports that she does not use drugs.  ROS: A complete review of systems was performed.  All systems are negative except for pertinent findings as noted. ROS   Physical Exam:  Vital signs in last 24 hours: Temp:  [98 F (36.7 C)] 98 F (36.7 C) (08/20 0617) Pulse Rate:  [58] 58 (08/20 0617) Resp:  [16] 16 (08/20 0617) BP: (135)/(64) 135/64 (08/20 0617) SpO2:  [100 %] 100 % (08/20 0617) Weight:  [50.8 kg] 50.8 kg (08/20 0629) General:  Alert and oriented, No acute distress HEENT: Normocephalic, atraumatic Neck: No JVD or lymphadenopathy Cardiovascular: Regular rate and rhythm Lungs: Regular rate and  effort Abdomen: Soft, nontender, nondistended, no abdominal masses Back: No CVA tenderness Extremities: No edema Neurologic: Grossly intact  Laboratory Data:  No results found for this or any previous visit (from the past 24 hour(s)). Recent Results (from the past 240 hour(s))  SARS CORONAVIRUS 2 (TAT 6-24 HRS) Nasopharyngeal Nasopharyngeal Swab     Status: None   Collection Time: 03/06/20 11:41 AM   Specimen: Nasopharyngeal Swab  Result Value Ref Range Status   SARS Coronavirus 2 NEGATIVE NEGATIVE Final    Comment: (NOTE) SARS-CoV-2 target nucleic acids are NOT DETECTED.  The SARS-CoV-2 RNA is generally detectable in upper and lower respiratory specimens during the acute phase of infection. Negative results do not preclude SARS-CoV-2 infection, do not rule out co-infections with other pathogens, and should not be used as the sole basis for treatment or other patient management decisions. Negative results must be combined with clinical observations, patient history, and epidemiological  information. The expected result is Negative.  Fact Sheet for Patients: SugarRoll.be  Fact Sheet for Healthcare Providers: https://www.woods-mathews.com/  This test is not yet approved or cleared by the [place] FDA and  has been authorized for detection and/or diagnosis of SARS-CoV-2 by FDA under an Emergency Use Authorization (EUA). This EUA will remain  in effect (meaning this test can be used) for the duration of the COVID-19 declaration under Se ction 564(b)(1) of the Act, 21 U.S.C. section 360bbb-3(b)(1), unless the authorization is terminated or revoked sooner.  Performed at [facility], [place] [address] [place], [place] [address]    Creatinine: No results for input(s): CREATININE in the last 168 hours.  Impression/Assessment:  Recurrent diverticulitis  Plan:  Proceed with bilateral open ended ureteral catheter placement.  Risks and benefits discussed.  Consent signed  Marton Redwood, III 03/09/2020, 7:33 AM

## 2020-03-09 NOTE — H&P (Signed)
The patient is a 69 year old female who presents with diverticulitis. 69 year old female who is here for hospital follow-up after recent hospitalization for diverticulitis. Patient has a history of alcohol abuse and presented to the hospital malnourished. She underwent left lower quadrant JP drain placement in IR. 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 She has a history of exploratory laparotomy in January 2006 with [name] patch, pyloroplasty and vagotomy's. She underwent another surgery in April 2006. 4. [name] procedure. Due to persistent ulcerations. She underwent another exploratory laparotomy and lysis of adhesions in January 2013 , with revision of her gastrojejunostomy. She also underwent a tubal ligation in 1977. Patient was hospitalized for over a week at [facility] in fall of 2020. She was noticed to be malnourished and was placed on TPN. It appears she slowly recovered. She did see Dr. Leone Rivera, who felt a colonoscopy was warranted when she improved. It appears she never followed up with his appointment. She then presented to the hospital and early May 2021 with a recurrent episode of diverticulitis with a 14 cm abscess. This was drained by interventional radiology. She was discharged home in stable condition. Her husband reports she has a good appetite and is gaining weight. Albumin was 2.5 on lab work from the hospital and 4.2 last week. She underwent a colonoscopy which showed an edematous sigmoid colon but no other findings. She is here today to discuss surgery.   Problem List/Past Medical Kirsten Levee, MD; 02/07/2020 9:19 AM) DIVERTICULITIS [phone])  Past Surgical History Kirsten Levee, MD; 02/07/2020 9:19 AM) Hysterectomy (not due to cancer) - Partial  Diagnostic Studies History Kirsten Levee, MD; 02/07/2020 9:19 AM) Mammogram never Pap Smear >5 years ago  Allergies [name], Rivera; 02/07/2020 8:56  AM) Penicillins Itching, Rash. Dilaudid *ANALGESICS - OPIOID* Rash. Allergies Reconciled  Medication History Kirsten Levee, MD; 02/07/2020 9:19 AM) Gabapentin (100MG  Capsule, Oral) Active. Folic Acid (1MG  Tablet, Oral) Active. Vitamin B-1 (100MG  Tablet, Oral) Active. Pantoprazole Sodium (40MG  Tablet DR, Oral) Active. Medications Reconciled Tylenol (500MG  Capsule, Oral) Active.   Social History , MD; 02/07/2020 9:19 AM) Alcohol use Recently quit alcohol use. Caffeine use Tea. No drug use Tobacco use Former smoker.  Family History , MD; 02/07/2020 9:19 AM) Breast Cancer Mother. Respiratory Condition Father.  Pregnancy / Birth History , MD; 02/07/2020 9:19 AM) Age at menarche 13 years. Age of menopause 26-55 Gravida 1 Irregular periods Maternal age 72-25 Para 1  Other Problems 02/09/2020, MD; 02/07/2020 9:19 AM) Alcohol Abuse Diverticulosis Gastric Ulcer     Review of Systems 02/09/2020 MD; 02/07/2020 9:20 AM) General Not Present- Appetite Loss, Chills, Fatigue, Fever, Night Sweats, Weight Gain and Weight Loss. Skin Not Present- Change in Wart/Mole, Dryness, Hives, Jaundice, New Lesions, Non-Healing Wounds, Rash and Ulcer. HEENT Not Present- Earache, Hearing Loss, Hoarseness, Nose Bleed, Oral Ulcers, Ringing in the Ears, Seasonal Allergies, Sinus Pain, Sore Throat, Visual Disturbances, Wears glasses/contact lenses and Yellow Eyes. Respiratory Not Present- Bloody sputum, Chronic Cough, Difficulty Breathing, Snoring and Wheezing. Breast Not Present- Breast Mass, Breast Pain, Nipple Discharge and Skin Changes. Cardiovascular Not Present- Chest Pain, Difficulty Breathing Lying Down, Leg Cramps, Palpitations, Rapid Heart Rate, Shortness of Breath and Swelling of Extremities. Gastrointestinal Not Present- Abdominal Pain, Bloating, Bloody Stool, Change in Bowel Habits, Chronic diarrhea, Constipation,  Difficulty Swallowing, Excessive gas, Gets full quickly at meals, Hemorrhoids, Indigestion, Nausea, Rectal Pain and Vomiting. Female Genitourinary Not Present- Frequency, Nocturia, Painful Urination, Pelvic Pain  and Urgency. Musculoskeletal Not Present- Back Pain, Joint Pain, Joint Stiffness, Muscle Pain, Muscle Weakness and Swelling of Extremities. Neurological Not Present- Decreased Memory, Fainting, Headaches, Numbness, Seizures, Tingling, Tremor, Trouble walking and Weakness. Psychiatric Not Present- Anxiety, Bipolar, Change in Sleep Pattern, Depression, Fearful and Frequent crying. Endocrine Not Present- Cold Intolerance, Excessive Hunger, Hair Changes, Heat Intolerance, Hot flashes and New Diabetes. Hematology Not Present- Blood Thinners, Easy Bruising, Excessive bleeding, Gland problems, HIV and Persistent Infections.  Vitals [name]; 02/07/2020 8:58 AM) 02/07/2020 8:58 AM Weight: 111.5 lb Height: 63in Body Surface Area: 1.51 m Body Mass Index: 19.75 kg/m  Temp.: 97.31F  Pulse: 100 (Regular)  P.OX: 93% (Room air) BP: 110/68(Sitting, Left Arm, Standard)        Physical Exam Kirsten Levee MD; 02/07/2020 9:20 AM)  General Mental Status-Alert. General Appearance-Cooperative.  Abdomen Palpation/Percussion Palpation and Percussion of the abdomen reveal - Soft.    Assessment & Plan Kirsten Levee MD; 02/07/2020 9:18 AM)  DIVERTICULITIS (Z61.09) Impression: 69 year old female who presents to the office with a recurrent diverticulitis with abscess. Status post drain placement May 2021. She is now status post a negative colonoscopy. Fistulogram shows persistent colonic fistula. Patient has resolved her malnutrition issues and is gaining weight. Her albumin is greater than 4. She has a history of multiple abdominal surgeries in her upper abdomen and therefore I have recommended an open sigmoidectomy. We have discussed this in detail including  time in the hospital and postoperative recovery. All questions were answered. Given the placement of the patient's abscess, I have recommended stent placement in the left ureter prior to surgery. The surgery and anatomy were described to the patient as well as the risks of surgery and the possible complications. These include: Bleeding, deep abdominal infections and possible wound complications such as hernia and infection, damage to adjacent structures, leak of surgical connections, which can lead to other surgeries and possibly an ostomy, possible need for other procedures, such as abscess drains in radiology, possible prolonged hospital stay, possible diarrhea from removal of part of the colon, possible constipation from narcotics, possible bowel, bladder or sexual dysfunction if having rectal surgery, prolonged fatigue/weakness or appetite loss, possible early recurrence of of disease, possible complications of their medical problems such as heart disease or arrhythmias or lung problems, death (less than 1%). I believe the patient understands and wishes to proceed with the surgery.

## 2020-03-09 NOTE — Anesthesia Postprocedure Evaluation (Signed)
Anesthesia Post Note  Patient: [name]  Procedure(s) Performed: OPEN SIGMOID COLECTOMY WITH RIGID PROCTOSCOPY (N/A ) CYSTOSCOPY WITH BILATERAL OPEN ENDED CATHETER PLACEMENT (Bilateral )     Patient location during evaluation: PACU Anesthesia Type: General Level of consciousness: awake and alert Pain management: pain level controlled Vital Signs Assessment: post-procedure vital signs reviewed and stable Respiratory status: spontaneous breathing, nonlabored ventilation, respiratory function stable and patient connected to nasal cannula oxygen Cardiovascular status: blood pressure returned to baseline and stable Postop Assessment: no apparent nausea or vomiting Anesthetic complications: no   No complications documented.  Last Vitals:  Vitals:   03/09/20 1354 03/09/20 1459  BP: (!) 127/53 (!) 121/54  Pulse: (!) 57 (!) 55  Resp:    Temp: 36.6 C 36.6 C  SpO2: 100% 100%    Last Pain:  Vitals:   03/09/20 1600  TempSrc:   PainSc: 0-No pain                 Jaceon Heiberger COKER

## 2020-03-09 NOTE — Transfer of Care (Signed)
Immediate Anesthesia Transfer of Care Note  Patient: [name]  Procedure(s) Performed: OPEN SIGMOID COLECTOMY WITH RIGID PROCTOSCOPY (N/A ) CYSTOSCOPY WITH BILATERAL OPEN ENDED CATHETER PLACEMENT (Bilateral )  Patient Location: PACU  Anesthesia Type:General  Level of Consciousness: drowsy  Airway & Oxygen Therapy: Patient Spontanous Breathing and Patient connected to face mask oxygen  Post-op Assessment: Report given to RN and Post -op Vital signs reviewed and stable  Post vital signs: Reviewed and stable  Last Vitals:  Vitals Value Taken Time  BP 151/70 03/09/20 0956  Temp    Pulse 68 03/09/20 0957  Resp 17 03/09/20 0957  SpO2 100 % 03/09/20 0957  Vitals shown include unvalidated device data.  Last Pain:  Vitals:   03/09/20 0617  TempSrc: Oral      Patients Stated Pain Goal: 4 (03/09/20 9935)  Complications: No complications documented.

## 2020-03-09 NOTE — Op Note (Signed)
03/09/2020  9:45 AM  PATIENT:  [name]  69 y.o. female  Patient Care Team: Philip Aspen, Limmie Patricia, MD as PCP - General (Internal Medicine) Iva Boop, MD as Consulting Physician (Gastroenterology)  PRE-OPERATIVE DIAGNOSIS:  DIVERTICULITIS with ABSCESS  POST-OPERATIVE DIAGNOSIS:   DIVERTICULITIS with ABSCESS  PROCEDURE:   OPEN SIGMOID COLECTOMY WITH RIGID PROCTOSCOPY CYSTOSCOPY WITH BILATERAL OPEN ENDED CATHETER PLACEMENT   Surgeon(s): [name], MD Crista Elliot, MD  ASSISTANT: Dr Cliffton Asters   ANESTHESIA:   general  EBL: 100 ml Total I/O In: 1000 [I.V.:1000] Out: 600 [Urine:500; Blood:100]  DRAINS: none   SPECIMEN:  Source of Specimen:  Sigmoid colon  DISPOSITION OF SPECIMEN:  PATHOLOGY  COUNTS:  YES  PLAN OF CARE: Admit to inpatient   PATIENT DISPOSITION:  PACU - hemodynamically stable.  INDICATION: 69 y.o. F with chronically perforated diverticular abscess with chronic indwelling drain   OR FINDINGS: significant inflammation of the L pelvic sidewall  DESCRIPTION: the patient was identified in the preoperative holding area and taken to the OR where they were laid supine on the operating room table.  General anesthesia was induced without difficulty. SCDs were also noted to be in place prior to the initiation of anesthesia.  The patient was then prepped and draped in the usual sterile fashion.   A surgical timeout was performed indicating the correct patient, procedure, positioning and need for preoperative antibiotics.   I began by making a lower midline incision using a 10 blade scalpel.  This was carried down through the subcutaneous tissue using electrocautery.  The fascia was incised at midline.  The peritoneum was entered bluntly.  The peritoneal adhesions were swept away and the incision was enlarged to the umbilicus superiorly and to the pubic bone inferiorly.  We then used Kocher clamps to elevate the fascia and divided the omentum and  adherent small bowel from the abdominal wall using blunt dissection and Metzenbaum scissors.  Once this was completely free, this was packed into the upper abdomen using moist sponges.  An Alexis wound protector was placed.  A Balfour retractor was then placed over this with the bladder blade intact.  I began by bluntly mobilizing the proximal sigmoid off of the pelvic wall and identifying the cavity of previous abscess.  The drain was then removed.  I felt for the stents in the left pelvic sidewall.  These were inferior to the area of inflammation.  I continued my blunt dissection down into the pelvis.  I divided the fallopian tube adhesions to the colon using electrocautery.  I entered into the presacral space bluntly.  I was able to mobilize the colon out of the pelvis.  Lastly, I divided the adhesions from the uterine wall to the colon using electrocautery and blunt dissection.  This allowed for mobilization of the sigmoid colon and rectum out of the pelvis.  The proximal sigmoid colon was transected using a blue load contour stapler proximal to the level of inflammation.  The remaining colon was packed out of the pelvis.  I then divided the mesentery using a LigaSure device down to the level of the sacral prominence.  Identified a portion of proximal rectum which was free of disease and divided this using a green load contour stapler.  The remaining mesentery was also divided using the LigaSure device.  The specimen was not sent to pathology for further examination.  We then packed off the pelvis and return to the distal descending colon.  A  pursestring or was placed and the colon was divided over this.  A 2-0 Prolene pursestring suture was placed and secured using 3-0 silk sutures.  A 29 mm EEA anvil was then placed into the colon and the pursestring was tied tightly around this.  This easily reached into the pelvis after dividing the white line of Toldt approximately halfway up the patient's abdomen.  We then  remove the sponge and inspected the pelvis for hemostasis.  There was no active bleeding noted.  The EEA was inserted into the rectal stump and brought out anteriorly to the staple line.  An anastomosis was created between the remaining descending colon and rectum.  There was no tension on the anastomosis.  There was no leak when insufflated with irrigation.  The anastomosis rests approximately 9 to 10 cm from the anal verge.  The abdomen was irrigated with a liter of warm normal saline.  Hemostasis was good.  I felt for the stents once again.  There was no sign of injury to the right or left ureter.  The Alexis wound protector and Terressa Koyanagi were removed and we switched to clean gowns, gloves, instruments and drapes.   The fascia was then closed using two #1 running PDS sutures.  The skin was then closed using a running 4-0 Vicryl subcuticular suture.  Sterile dressing was applied.  The ureteral stents were also removed at the end of the procedure and the Foley was left in place.  Patient was then awakened from anesthesia and sent to the postanesthesia care unit in stable condition.  All counts were correct per operating room staff.

## 2020-03-09 NOTE — Anesthesia Preprocedure Evaluation (Signed)
Anesthesia Evaluation  Patient identified by MRN, date of birth, ID band Patient awake    Reviewed: Allergy & Precautions, NPO status , Patient's Chart, lab work & pertinent test results  Airway Mallampati: II  TM Distance: >3 FB Neck ROM: Full    Dental  (+) Edentulous Upper, Edentulous Lower   Pulmonary former smoker,    breath sounds clear to auscultation       Cardiovascular hypertension,  Rhythm:Regular Rate:Normal     Neuro/Psych    GI/Hepatic   Endo/Other    Renal/GU      Musculoskeletal   Abdominal   Peds  Hematology   Anesthesia Other Findings   Reproductive/Obstetrics                             Anesthesia Physical Anesthesia Plan  ASA: III  Anesthesia Plan: General   Post-op Pain Management:    Induction: Intravenous  PONV Risk Score and Plan: Ondansetron and Dexamethasone  Airway Management Planned: Oral ETT  Additional Equipment:   Intra-op Plan:   Post-operative Plan: Extubation in OR  Informed Consent: I have reviewed the patients History and Physical, chart, labs and discussed the procedure including the risks, benefits and alternatives for the proposed anesthesia with the patient or authorized representative who has indicated his/her understanding and acceptance.       Plan Discussed with: CRNA and Anesthesiologist  Anesthesia Plan Comments:         Anesthesia Quick Evaluation  

## 2020-03-09 NOTE — Anesthesia Procedure Notes (Signed)
Procedure Name: Intubation Date/Time: 03/09/2020 7:52 AM Performed by: Florene Route, CRNA Patient Re-evaluated:Patient Re-evaluated prior to induction Oxygen Delivery Method: Circle system utilized Preoxygenation: Pre-oxygenation with 100% oxygen Induction Type: IV induction Ventilation: Mask ventilation without difficulty Laryngoscope Size: Miller and 2 Grade View: Grade I Tube type: Oral Tube size: 7.5 mm Number of attempts: 1 Airway Equipment and Method: Stylet Placement Confirmation: ETT inserted through vocal cords under direct vision,  positive ETCO2 and breath sounds checked- equal and bilateral Secured at: 19 cm Tube secured with: Tape Dental Injury: Teeth and Oropharynx as per pre-operative assessment

## 2020-03-09 NOTE — Op Note (Signed)
Operative Note  Preoperative diagnosis:  1.  Recurrent diverticulitis  Post operative diagnosis: 1.  Recurrent diverticulitis  Procedure(s): 1.  Cystoscopy with bilateral retrograde pyelogram and bilateral open-ended ureteral catheter placement  Surgeon: Natausha Jungwirth, MD  Assistants: None  Anesthesia: General  Complications: None immediate  EBL: Minimal  Specimens: 1.  None  Drains/Catheters: 1.  Bilateral open-ended ureteral catheters 2.  Foley catheter  Intraoperative findings: 1.  Normal urethra and bladder   Indication: 69-year-old female with a history of recurrent diverticulitis presents for colectomy.  Intraoperative stent placement was requested.  Description of procedure:  The patient was identified and consent was obtained.  The patient was taken to the operating room and placed in the supine position.  The patient was placed under general anesthesia.  Perioperative antibiotics were administered.  The patient was placed in dorsal lithotomy.  Patient was prepped and draped in a standard sterile fashion and a timeout was performed.  A 21 French rigid cystoscope was advanced into the urethra and into the bladder.    A sensor wire was then advanced up to the left kidney.  The open-ended ureteral catheter was advanced over the wire up to the kidney and then the scope and wire were withdrawn keeping the open-ended ureteral catheter in place.    A sensor wire was then advanced up to the right kidney.  The open-ended ureteral catheter was advanced over the wire up to the kidney and then the scope and wire were withdrawn keeping the open-ended ureteral catheter in place.Foley catheter was placed and both open-ended ureteral catheters were secured and threaded through into a drainage bag.  This concluded my portion of the operation.  Patient tolerated procedure well and the case was handed over to general surgery.  Plan: Per general surgery.  Urology will be available as  needed.  

## 2020-03-10 ENCOUNTER — Encounter (HOSPITAL_COMMUNITY): Payer: Self-pay | Admitting: General Surgery

## 2020-03-10 LAB — CBC
HCT: 32.5 % — ABNORMAL LOW (ref 36.0–46.0)
Hemoglobin: 11.1 g/dL — ABNORMAL LOW (ref 12.0–15.0)
MCH: 31.7 pg (ref 26.0–34.0)
MCHC: 34.2 g/dL (ref 30.0–36.0)
MCV: 92.9 fL (ref 80.0–100.0)
Platelets: 211 10*3/uL (ref 150–400)
RBC: 3.5 MIL/uL — ABNORMAL LOW (ref 3.87–5.11)
RDW: 13.5 % (ref 11.5–15.5)
WBC: 12.8 10*3/uL — ABNORMAL HIGH (ref 4.0–10.5)
nRBC: 0 % (ref 0.0–0.2)

## 2020-03-10 LAB — BASIC METABOLIC PANEL
Anion gap: 7 (ref 5–15)
BUN: <5 mg/dL — ABNORMAL LOW (ref 8–23)
CO2: 24 mmol/L (ref 22–32)
Calcium: 7.1 mg/dL — ABNORMAL LOW (ref 8.9–10.3)
Chloride: 97 mmol/L — ABNORMAL LOW (ref 98–111)
Creatinine, Ser: 0.46 mg/dL (ref 0.44–1.00)
GFR calc Af Amer: >60 mL/min (ref >60)
GFR calc non Af Amer: >60 mL/min (ref >60)
Glucose, Bld: 118 mg/dL — ABNORMAL HIGH (ref 70–99)
Potassium: 4.3 mmol/L (ref 3.5–5.1)
Sodium: 128 mmol/L — ABNORMAL LOW (ref 135–145)

## 2020-03-10 MED ORDER — METHOCARBAMOL 500 MG PO TABS
500.0000 mg | ORAL_TABLET | Freq: Three times a day (TID) | ORAL | Status: DC | PRN
  Administered 2020-03-10 – 2020-03-12 (×3): 500 mg via ORAL
  Filled 2020-03-10 (×4): qty 1

## 2020-03-10 NOTE — Progress Notes (Signed)
1 Day Post-Op open sigmoidectomy Subjective: C/o pain.  No nausea.  No flatus.  Ambulated in the hall  Objective: Vital signs in last 24 hours: Temp:  [97.4 F (36.3 C)-98.6 F (37 C)] 98.4 F (36.9 C) (08/21 3354) Pulse Rate:  [53-68] 65 (08/21 0608) Resp:  [10-23] 17 (08/21 0608) BP: (108-151)/(51-70) 108/51 (08/21 0608) SpO2:  [99 %-100 %] 100 % (08/21 5625) Weight:  [52.9 kg] 52.9 kg (08/21 0623)   Intake/Output from previous day: 08/20 0701 - 08/21 0700 In: 2451.2 [P.O.:180; I.V.:2171.2; IV Piggyback:100] Out: 2100 [Urine:2000; Blood:100] Intake/Output this shift: No intake/output data recorded.   General appearance: alert and cooperative GI: normal findings: soft, non-distended  Incision: no significant drainage  Lab Results:  Recent Labs    03/10/20 0542  WBC 12.8*  HGB 11.1*  HCT 32.5*  PLT 211   BMET Recent Labs    03/10/20 0542  NA 128*  K 4.3  CL 97*  CO2 24  GLUCOSE 118*  BUN <5*  CREATININE 0.46  CALCIUM 7.1*   PT/INR No results for input(s): LABPROT, INR in the last 72 hours. ABG No results for input(s): PHART, HCO3 in the last 72 hours.  Invalid input(s): PCO2, PO2  MEDS, Scheduled . acetaminophen  1,000 mg Oral Q6H  . alvimopan  12 mg Oral BID  . Chlorhexidine Gluconate Cloth  6 each Topical Daily  . enoxaparin (LOVENOX) injection  40 mg Subcutaneous Q24H  . gabapentin  100 mg Oral BID  . saccharomyces boulardii  250 mg Oral BID    Studies/Results: No results found.  Assessment: s/p Procedure(s): OPEN SIGMOID COLECTOMY WITH RIGID PROCTOSCOPY CYSTOSCOPY WITH BILATERAL OPEN ENDED CATHETER PLACEMENT Patient Active Problem List   Diagnosis Date Noted  . Diverticular disease 03/09/2020  . Abscess of sigmoid colon due to diverticulitis 11/23/2019  . Long QT interval 11/23/2019  . Vitamin D deficiency 06/03/2019  . Dementia (HCC) 04/05/2019  . Malnutrition of moderate degree 08/23/2017  . Hyponatremia 08/20/2017  . Anemia  08/20/2017  . LUQ abdominal pain near former jejunostomy site 03/04/2011  . 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 SHINGLES 08/19/2010  . B12 deficiency 10/02/2009  . Iron deficiency anemia 07/09/2007  . DIVERTICULOSIS 07/09/2007  . Essential hypertension 06/23/2007  . 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 INSOMNIA 05/31/2007  . HEADACHE 05/31/2007  . Anxiety state 01/21/2007  . Alcoholism (HCC) 01/21/2007  . Osteoporosis 01/21/2007    Expected post op ileus  Plan: Cont IVF's and IV pain meds  Await return of bowel function Ambulate   LOS: 1 day     .Vanita Panda, MD [facility], [place]    03/10/2020 7:55 AM

## 2020-03-11 LAB — BASIC METABOLIC PANEL
Anion gap: 8 (ref 5–15)
BUN: <5 mg/dL — ABNORMAL LOW (ref 8–23)
CO2: 24 mmol/L (ref 22–32)
Calcium: 7.8 mg/dL — ABNORMAL LOW (ref 8.9–10.3)
Chloride: 98 mmol/L (ref 98–111)
Creatinine, Ser: 0.4 mg/dL — ABNORMAL LOW (ref 0.44–1.00)
GFR calc Af Amer: >60 mL/min (ref >60)
GFR calc non Af Amer: >60 mL/min (ref >60)
Glucose, Bld: 101 mg/dL — ABNORMAL HIGH (ref 70–99)
Potassium: 4 mmol/L (ref 3.5–5.1)
Sodium: 130 mmol/L — ABNORMAL LOW (ref 135–145)

## 2020-03-11 LAB — CBC
HCT: 33.1 % — ABNORMAL LOW (ref 36.0–46.0)
Hemoglobin: 11.1 g/dL — ABNORMAL LOW (ref 12.0–15.0)
MCH: 31.9 pg (ref 26.0–34.0)
MCHC: 33.5 g/dL (ref 30.0–36.0)
MCV: 95.1 fL (ref 80.0–100.0)
Platelets: 246 10*3/uL (ref 150–400)
RBC: 3.48 MIL/uL — ABNORMAL LOW (ref 3.87–5.11)
RDW: 13.8 % (ref 11.5–15.5)
WBC: 11.6 10*3/uL — ABNORMAL HIGH (ref 4.0–10.5)
nRBC: 0 % (ref 0.0–0.2)

## 2020-03-11 MED ORDER — METOPROLOL TARTRATE 5 MG/5ML IV SOLN
5.0000 mg | Freq: Four times a day (QID) | INTRAVENOUS | Status: DC | PRN
  Administered 2020-03-11 – 2020-03-12 (×2): 5 mg via INTRAVENOUS
  Filled 2020-03-11 (×3): qty 5

## 2020-03-11 MED ORDER — METOPROLOL TARTRATE 5 MG/5ML IV SOLN: 5.0000 mg | Freq: Four times a day (QID) | INTRAVENOUS | Status: DC | PRN

## 2020-03-11 MED ORDER — SODIUM CHLORIDE 0.9 % IV SOLN: INTRAVENOUS | Status: DC

## 2020-03-11 NOTE — Progress Notes (Addendum)
2 Days Post-Op open sigmoidectomy Subjective: C/o pain.  Occasional nausea.  No flatus.  Ambulated in the hall.  Foley out  Objective: Vital signs in last 24 hours: Temp:  [97.4 F (36.3 C)-98.5 F (36.9 C)] 98.2 F (36.8 C) (08/22 0446) Pulse Rate:  [65-72] 67 (08/22 0446) Resp:  [15-18] 15 (08/22 0446) BP: (128-150)/(56-62) 142/60 (08/22 0446) SpO2:  [96 %-100 %] 97 % (08/22 0446) Weight:  [50.2 kg] 50.2 kg (08/22 0500)   Intake/Output from previous day: 08/21 0701 - 08/22 0700 In: -  Out: 1800 [Urine:1800] Intake/Output this shift: No intake/output data recorded.   General appearance: alert and cooperative GI: normal findings: soft, mildly distended  Incision: no significant drainage  Lab Results:  Recent Labs    03/10/20 0542 03/11/20 0602  WBC 12.8* 11.6*  HGB 11.1* 11.1*  HCT 32.5* 33.1*  PLT 211 246   BMET Recent Labs    03/10/20 0542 03/11/20 0602  NA 128* 130*  K 4.3 4.0  CL 97* 98  CO2 24 24  GLUCOSE 118* 101*  BUN <5* <5*  CREATININE 0.46 0.40*  CALCIUM 7.1* 7.8*   PT/INR No results for input(s): LABPROT, INR in the last 72 hours. ABG No results for input(s): PHART, HCO3 in the last 72 hours.  Invalid input(s): PCO2, PO2  MEDS, Scheduled . acetaminophen  1,000 mg Oral Q6H  . alvimopan  12 mg Oral BID  . Chlorhexidine Gluconate Cloth  6 each Topical Daily  . enoxaparin (LOVENOX) injection  40 mg Subcutaneous Q24H  . gabapentin  100 mg Oral BID  . saccharomyces boulardii  250 mg Oral BID    Studies/Results: No results found.  Assessment: s/p Procedure(s): OPEN SIGMOID COLECTOMY WITH RIGID PROCTOSCOPY CYSTOSCOPY WITH BILATERAL OPEN ENDED CATHETER PLACEMENT Patient Active Problem List   Diagnosis Date Noted  . Diverticular disease 03/09/2020  . Abscess of sigmoid colon due to diverticulitis 11/23/2019  . Long QT interval 11/23/2019  . Vitamin D deficiency 06/03/2019  . Dementia (HCC) 04/05/2019  . Malnutrition of moderate  degree 08/23/2017  . Hyponatremia 08/20/2017  . Anemia 08/20/2017  . LUQ abdominal pain near former jejunostomy site 03/04/2011  . 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 SHINGLES 08/19/2010  . B12 deficiency 10/02/2009  . Iron deficiency anemia 07/09/2007  . DIVERTICULOSIS 07/09/2007  . Essential hypertension 06/23/2007  . 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 INSOMNIA 05/31/2007  . HEADACHE 05/31/2007  . Anxiety state 01/21/2007  . Alcoholism (HCC) 01/21/2007  . Osteoporosis 01/21/2007    Expected post op ileus  Plan: Cont IVF's and IV pain meds  Await return of bowel function Ambulate   LOS: 2 days     .Vanita Panda, MD [facility], [place]    03/11/2020 7:46 AM

## 2020-03-12 LAB — CBC
HCT: 33.9 % — ABNORMAL LOW (ref 36.0–46.0)
Hemoglobin: 11.7 g/dL — ABNORMAL LOW (ref 12.0–15.0)
MCH: 32.1 pg (ref 26.0–34.0)
MCHC: 34.5 g/dL (ref 30.0–36.0)
MCV: 92.9 fL (ref 80.0–100.0)
Platelets: 282 10*3/uL (ref 150–400)
RBC: 3.65 MIL/uL — ABNORMAL LOW (ref 3.87–5.11)
RDW: 13.3 % (ref 11.5–15.5)
WBC: 10.5 10*3/uL (ref 4.0–10.5)
nRBC: 0 % (ref 0.0–0.2)

## 2020-03-12 LAB — BASIC METABOLIC PANEL
Anion gap: 12 (ref 5–15)
BUN: 6 mg/dL — ABNORMAL LOW (ref 8–23)
CO2: 23 mmol/L (ref 22–32)
Calcium: 7.8 mg/dL — ABNORMAL LOW (ref 8.9–10.3)
Chloride: 93 mmol/L — ABNORMAL LOW (ref 98–111)
Creatinine, Ser: 0.33 mg/dL — ABNORMAL LOW (ref 0.44–1.00)
GFR calc Af Amer: >60 mL/min (ref >60)
GFR calc non Af Amer: >60 mL/min (ref >60)
Glucose, Bld: 102 mg/dL — ABNORMAL HIGH (ref 70–99)
Potassium: 3.8 mmol/L (ref 3.5–5.1)
Sodium: 128 mmol/L — ABNORMAL LOW (ref 135–145)

## 2020-03-12 LAB — SURGICAL PATHOLOGY

## 2020-03-12 NOTE — Care Management Important Message (Signed)
Important Message  Patient Details IM Letter given to the Patient Name: [name] MRN: [id number] Date of Birth: 03/13/1951   Medicare Important Message Given:  Yes     Caren Macadam 03/12/2020, 11:21 AM

## 2020-03-12 NOTE — Plan of Care (Signed)

## 2020-03-12 NOTE — Progress Notes (Signed)
3 Days Post-Op open sigmoidectomy Subjective: Pain controlled.  Less nausea.  No flatus.  Ambulated in the hall 3 times  Objective: Vital signs in last 24 hours: Temp:  [98 F (36.7 C)-98.6 F (37 C)] 98.2 F (36.8 C) (08/23 0532) Pulse Rate:  [64-79] 69 (08/23 0532) Resp:  [14-17] 15 (08/23 0532) BP: (164-176)/(73-83) 176/74 (08/23 0532) SpO2:  [95 %-98 %] 96 % (08/23 0532)   Intake/Output from previous day: 08/22 0701 - 08/23 0700 In: 1380 [P.O.:180; I.V.:1200] Out: 2150 [Urine:2150] Intake/Output this shift: No intake/output data recorded.   General appearance: alert and cooperative GI: normal findings: soft, less distended  Incision: no significant drainage  Lab Results:  Recent Labs    03/11/20 0602 03/12/20 0457  WBC 11.6* 10.5  HGB 11.1* 11.7*  HCT 33.1* 33.9*  PLT 246 282   BMET Recent Labs    03/11/20 0602 03/12/20 0457  NA 130* 128*  K 4.0 3.8  CL 98 93*  CO2 24 23  GLUCOSE 101* 102*  BUN <5* 6*  CREATININE 0.40* 0.33*  CALCIUM 7.8* 7.8*   PT/INR No results for input(s): LABPROT, INR in the last 72 hours. ABG No results for input(s): PHART, HCO3 in the last 72 hours.  Invalid input(s): PCO2, PO2  MEDS, Scheduled  acetaminophen  1,000 mg Oral Q6H   alvimopan  12 mg Oral BID   Chlorhexidine Gluconate Cloth  6 each Topical Daily   enoxaparin (LOVENOX) injection  40 mg Subcutaneous Q24H   gabapentin  100 mg Oral BID   saccharomyces boulardii  250 mg Oral BID    Studies/Results: No results found.  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

## 2020-03-12 NOTE — Plan of Care (Signed)

## 2020-03-13 MED ORDER — OXYCODONE HCL 5 MG PO TABS
5.0000 mg | ORAL_TABLET | ORAL | Status: DC | PRN
  Administered 2020-03-13 – 2020-03-14 (×2): 5 mg via ORAL
  Filled 2020-03-13 (×3): qty 1

## 2020-03-13 NOTE — Progress Notes (Signed)
4 Days Post-Op open sigmoidectomy Subjective: Pain controlled.  No nausea.  Having lots of flatus.  Ambulated in the hall 5 times  Objective: Vital signs in last 24 hours: Temp:  [97.6 F (36.4 C)-98.5 F (36.9 C)] 98 F (36.7 C) (08/24 0624) Pulse Rate:  [66-70] 70 (08/24 0624) Resp:  [17] 17 (08/24 0624) BP: (168-182)/(69-83) 174/81 (08/24 0624) SpO2:  [97 %-100 %] 97 % (08/24 0624) Weight:  [49 kg] 49 kg (08/24 0500)   Intake/Output from previous day: 08/23 0701 - 08/24 0700 In: 1950 [I.V.:1950] Out: 3200 [Urine:3200] Intake/Output this shift: No intake/output data recorded.   General appearance: alert and cooperative GI: normal findings: soft, non-distended  Incision: clean, dry, intact  Lab Results:  Recent Labs    03/11/20 0602 03/12/20 0457  WBC 11.6* 10.5  HGB 11.1* 11.7*  HCT 33.1* 33.9*  PLT 246 282   BMET Recent Labs    03/11/20 0602 03/12/20 0457  NA 130* 128*  K 4.0 3.8  CL 98 93*  CO2 24 23  GLUCOSE 101* 102*  BUN <5* 6*  CREATININE 0.40* 0.33*  CALCIUM 7.8* 7.8*   PT/INR No results for input(s): LABPROT, INR in the last 72 hours. ABG No results for input(s): PHART, HCO3 in the last 72 hours.  Invalid input(s): PCO2, PO2  MEDS, Scheduled . acetaminophen  1,000 mg Oral Q6H  . alvimopan  12 mg Oral BID  . Chlorhexidine Gluconate Cloth  6 each Topical Daily  . enoxaparin (LOVENOX) injection  40 mg Subcutaneous Q24H  . gabapentin  100 mg Oral BID  . saccharomyces boulardii  250 mg Oral BID    Studies/Results: No results found.  Assessment: s/p Procedure(s): OPEN SIGMOID COLECTOMY WITH RIGID PROCTOSCOPY CYSTOSCOPY WITH BILATERAL OPEN ENDED CATHETER PLACEMENT Patient Active Problem List   Diagnosis Date Noted  . Diverticular disease 03/09/2020  . Abscess of sigmoid colon due to diverticulitis 11/23/2019  . Long QT interval 11/23/2019  . Vitamin D deficiency 06/03/2019  . Dementia (HCC) 04/05/2019  . Malnutrition of moderate  degree 08/23/2017  . Hyponatremia 08/20/2017  . Anemia 08/20/2017  . LUQ abdominal pain near former jejunostomy site 03/04/2011  . 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 SHINGLES 08/19/2010  . B12 deficiency 10/02/2009  . Iron deficiency anemia 07/09/2007  . DIVERTICULOSIS 07/09/2007  . Essential hypertension 06/23/2007  . 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 INSOMNIA 05/31/2007  . HEADACHE 05/31/2007  . Anxiety state 01/21/2007  . Alcoholism (HCC) 01/21/2007  . Osteoporosis 01/21/2007    Expected post op ileus  Plan: Decrease IVF's Start clears/fulls today  Await return of bowel function Ambulate    LOS: 4 days     .Vanita Panda, MD [facility], [place]    03/13/2020 8:37 AM

## 2020-03-14 MED ORDER — OXYCODONE HCL 5 MG PO TABS
5.0000 mg | ORAL_TABLET | Freq: Four times a day (QID) | ORAL | 0 refills | Status: DC | PRN
Start: 2020-03-14 — End: 2020-09-05

## 2020-03-14 MED FILL — oxyCODONE HCL 5 MG TABS: 5 | 4 days supply | Qty: 15 | Fill #0

## 2020-03-14 NOTE — Progress Notes (Signed)
Discharged home with husband.

## 2020-03-14 NOTE — Discharge Instructions (Addendum)
ABDOMINAL SURGERY: POST OP INSTRUCTIONS ° °1. DIET: Follow a light bland diet the first 24 hours after arrival home, such as soup, liquids, crackers, etc.  Be sure to include lots of fluids daily.  Avoid fast food or heavy meals as your are more likely to get nauseated.  Do not eat any uncooked fruits or vegetables for the next 2 weeks as your colon heals. °2. Take your usually prescribed home medications unless otherwise directed. °3. PAIN CONTROL: °a. Pain is best controlled by a usual combination of three different methods TOGETHER: °i. Ice/Heat °ii. Over the counter pain medication °iii. Prescription pain medication °b. Most patients will experience some swelling and bruising around the incisions.  Ice packs or heating pads (30-60 minutes up to 6 times a day) will help. Use ice for the first few days to help decrease swelling and bruising, then switch to heat to help relax tight/sore spots and speed recovery.  Some people prefer to use ice alone, heat alone, alternating between ice & heat.  Experiment to what works for you.  Swelling and bruising can take several weeks to resolve.   °c. It is helpful to take an over-the-counter pain medication regularly for the first few weeks.  Choose one of the following that works best for you: °i. Naproxen (Aleve, etc)  Two 220mg tabs twice a day °ii. Ibuprofen (Advil, etc) Three 200mg tabs four times a day (every meal & bedtime) °iii. Acetaminophen (Tylenol, etc) 500-650mg four times a day (every meal & bedtime) °d. A  prescription for pain medication (such as oxycodone, hydrocodone, etc) should be given to you upon discharge.  Take your pain medication as prescribed.  °i. If you are having problems/concerns with the prescription medicine (does not control pain, nausea, vomiting, rash, itching, etc), please call us [phone] to see if we need to switch you to a different pain medicine that will work better for you and/or control your side effect better. °ii. If you  need a refill on your pain medication, please contact your pharmacy.  They will contact our office to request authorization. Prescriptions will not be filled after 5 pm or on week-ends. °4. Avoid getting constipated.  Between the surgery and the pain medications, it is common to experience some constipation.  Increasing fluid intake and taking a fiber supplement (such as Metamucil, Citrucel, FiberCon, MiraLax, etc) 1-2 times a day regularly will usually help prevent this problem from occurring.  A mild laxative (prune juice, Milk of Magnesia, MiraLax, etc) should be taken according to package directions if there are no bowel movements after 48 hours.   °5. Watch out for diarrhea.  If you have many loose bowel movements, simplify your diet to bland foods & liquids for a few days.  Stop any stool softeners and decrease your fiber supplement.  Switching to mild anti-diarrheal medications (Kayopectate, Pepto Bismol) can help.  If this worsens or does not improve, please call us. °6. Wash / shower every day.  You may shower over the incision / wound.  Avoid baths until the skin is fully healed.  Continue to shower over incision(s) after the dressing is off. °7. Remove your waterproof bandages 5 days after surgery.  You may leave the incision open to air.  You may replace a dressing/Band-Aid to cover the incision for comfort if you wish. °8. ACTIVITIES as tolerated:   °a. You may resume regular (light) daily activities beginning the next day--such as daily self-care, walking, climbing stairs--gradually increasing activities as   tolerated.  If you can walk 30 minutes without difficulty, it is safe to try more intense activity such as jogging, treadmill, bicycling, low-impact aerobics, swimming, etc. b. Save the most intensive and strenuous activity for last such as sit-ups, heavy lifting, contact sports, etc  Refrain from any heavy lifting or straining until you are off narcotics for pain control.   c. DO NOT PUSH THROUGH  PAIN.  Let pain be your guide: If it hurts to do something, don't do it.  Pain is your body warning you to avoid that activity for another week until the pain goes down. d. You may drive when you are no longer taking prescription pain medication, you can comfortably wear a seatbelt, and you can safely maneuver your car and apply brakes. e. Bonita Quin may have sexual intercourse when it is comfortable.  9. FOLLOW UP in our office a. Please call [facility] at [phone] to set up an appointment to see your surgeon in the office for a follow-up appointment approximately 1-2 weeks after your surgery. b. Make sure that you call for this appointment the day you arrive home to insure a convenient appointment time. 10. IF YOU HAVE DISABILITY OR FAMILY LEAVE FORMS, BRING THEM TO THE OFFICE FOR PROCESSING.  DO NOT GIVE THEM TO YOUR DOCTOR.   WHEN TO CALL us [phone]: 1. Poor pain control 2. Reactions / problems with new medications (rash/itching, nausea, etc)  3. Fever over 101.5 F (38.5 C) 4. Inability to urinate 5. Nausea and/or vomiting 6. Worsening swelling or bruising 7. Continued bleeding from incision. 8. Increased pain, redness, or drainage from the incision  The clinic staff is available to answer your questions during regular business hours (8:30am-5pm).  Please dont hesitate to call and ask to speak to one of our nurses for clinical concerns.   A surgeon from [facility] is always on call at the hospitals   If you have a medical emergency, go to the nearest emergency room or call 911.    [facility] [address], [place], [place]  [address] ? MAIN: [phone] ? TOLL FREE: [phone] ? FAX [phone] [email or web address]

## 2020-03-14 NOTE — Progress Notes (Signed)
Pharmacy Brief Note - Alvimopan (Entereg)  The standing order set for alvimopan (Entereg) now includes an automatic order to discontinue the drug after the patient has had a bowel movement. The change was approved by the Pharmacy & Therapeutics Committee and the Medical Executive Committee.   This patient has had bowel movements documented by nursing. Therefore, alvimopan has been discontinued. If there are questions, please contact the pharmacy at [phone].   Thank you-  Dorna Leitz, PharmD, BCPS 03/14/2020 9:34 AM

## 2020-03-14 NOTE — Discharge Summary (Signed)
Physician Discharge Summary  Patient ID: [name] MRN: [id number] DOB/AGE: [birth year] 69 y.o.  Admit date: 03/09/2020 Discharge date: 03/14/2020  Admission Diagnoses: diverticular disease  Discharge Diagnoses:  Active Problems:   Diverticular disease   Discharged Condition: good  Hospital Course: Pt admitted after surgery.  Diet was advanced after return of bowel function.  Discharged when tolerating a diet and PO pain meds  Consults: None  Significant Diagnostic Studies: labs: cbc, bmet  Treatments: IV hydration, analgesia: acetaminophen and surgery: open sigmodectomy  Discharge Exam: Blood pressure (!) 186/77, pulse 71, temperature 98.7 F (37.1 C), temperature source Oral, resp. rate 17, height 5\' 3"  (1.6 m), weight 49 kg, SpO2 97 %. General appearance: alert and cooperative GI: normal findings: soft, non-tender Incision/Wound: clean, dry, intact  Disposition: Discharge disposition: 01-Home or Self Care        Allergies as of 03/14/2020      Reactions   Sulfa Antibiotics Anaphylaxis, Rash   Bee Venom Swelling   Dilaudid [hydromorphone Hcl] Hives   Penicillins Other (See Comments)   Has patient had a PCN reaction causing immediate rash, facial/tongue/throat swelling, SOB or lightheadedness with hypotension: No Has patient had a PCN reaction causing severe rash involving mucus membranes or skin necrosis: No Has patient had a PCN reaction that required hospitalization No Has patient had a PCN reaction occurring within the last 10 years: No If all of the above answers are "NO", then may proceed with Cephalosporin use.      Medication List    STOP taking these medications   metroNIDAZOLE 500 MG tablet Commonly known as: FLAGYL   neomycin 500 MG tablet Commonly known as: MYCIFRADIN   Plenvu 140 g Solr Generic drug: [id number]-Na Asc-C     TAKE these medications   acetaminophen 500 MG tablet Commonly known as: TYLENOL Take 500 mg by mouth  every 6 (six) hours as needed.   calcium carbonate 1250 (500 Ca) MG tablet Commonly known as: OS-CAL - dosed in mg of elemental calcium Take 1 tablet by mouth daily.   denosumab 60 MG/ML Sosy injection Commonly known as: PROLIA Inject 60 mg into the skin every 6 (six) months.   folic acid 1 MG tablet Commonly known as: FOLVITE Take 1 tablet (1 mg total) by mouth daily. What changed: when to take this   gabapentin 100 MG capsule Commonly known as: NEURONTIN TAKE 1 CAPSULE BY MOUTH TWICE A DAY   multivitamin with minerals Tabs tablet Take 1 tablet by mouth daily. Centrum Silver   oxyCODONE 5 MG immediate release tablet Commonly known as: Oxy IR/ROXICODONE Take 1 tablet (5 mg total) by mouth every 6 (six) hours as needed for moderate pain, severe pain or breakthrough pain.   pantoprazole 40 MG tablet Commonly known as: PROTONIX TAKE 1 TABLET BY MOUTH EVERY DAY What changed: when to take this   thiamine 100 MG tablet Take 1 tablet (100 mg total) by mouth daily. What changed: when to take this       Follow-up Information    03/16/2020, MD. Schedule an appointment as soon as possible for a visit in 2 week(s).   Specialty: General Surgery Contact information: [address] [place] [phone]               Signed: [phone] 03/14/2020, 8:37 AM

## 2020-04-09 ENCOUNTER — Other Ambulatory Visit: Payer: Self-pay

## 2020-04-10 ENCOUNTER — Other Ambulatory Visit: Payer: Self-pay

## 2020-04-10 ENCOUNTER — Ambulatory Visit (INDEPENDENT_AMBULATORY_CARE_PROVIDER_SITE_OTHER): Payer: PPO | Admitting: Internal Medicine

## 2020-04-10 ENCOUNTER — Encounter: Payer: Self-pay | Admitting: Internal Medicine

## 2020-04-10 VITALS — BP 130/80 | HR 62 | Temp 97.8°F | Ht 62.5 in | Wt 106.2 lb

## 2020-04-10 DIAGNOSIS — E559 Vitamin D deficiency, unspecified: Secondary | ICD-10-CM | POA: Diagnosis not present

## 2020-04-10 DIAGNOSIS — F1020 Alcohol dependence, uncomplicated: Secondary | ICD-10-CM

## 2020-04-10 DIAGNOSIS — K5790 Diverticulosis of intestine, part unspecified, without perforation or abscess without bleeding: Secondary | ICD-10-CM | POA: Diagnosis not present

## 2020-04-10 DIAGNOSIS — Z0000 Encounter for general adult medical examination without abnormal findings: Secondary | ICD-10-CM

## 2020-04-10 DIAGNOSIS — K5720 Diverticulitis of large intestine with perforation and abscess without bleeding: Secondary | ICD-10-CM | POA: Diagnosis not present

## 2020-04-10 DIAGNOSIS — Z23 Encounter for immunization: Secondary | ICD-10-CM

## 2020-04-10 DIAGNOSIS — E538 Deficiency of other specified B group vitamins: Secondary | ICD-10-CM

## 2020-04-10 DIAGNOSIS — E440 Moderate protein-calorie malnutrition: Secondary | ICD-10-CM

## 2020-04-10 DIAGNOSIS — M810 Age-related osteoporosis without current pathological fracture: Secondary | ICD-10-CM | POA: Diagnosis not present

## 2020-04-10 DIAGNOSIS — D509 Iron deficiency anemia, unspecified: Secondary | ICD-10-CM

## 2020-04-10 NOTE — Patient Instructions (Signed)
-Nice seeing you today!!  -Lab work today; will notify you once results are available.  -Flu and Pneumonia vaccines today.  -Schedule follow up in 6 months.   Preventive Care 66 Years and Older, Female Preventive care refers to lifestyle choices and visits with your health care provider that can promote health and wellness. This includes:  A yearly physical exam. This is also called an annual well check.  Regular dental and eye exams.  Immunizations.  Screening for certain conditions.  Healthy lifestyle choices, such as diet and exercise. What can I expect for my preventive care visit? Physical exam Your health care provider will check:  Height and weight. These may be used to calculate body mass index (BMI), which is a measurement that tells if you are at a healthy weight.  Heart rate and blood pressure.  Your skin for abnormal spots. Counseling Your health care provider may ask you questions about:  Alcohol, tobacco, and drug use.  Emotional well-being.  Home and relationship well-being.  Sexual activity.  Eating habits.  History of falls.  Memory and ability to understand (cognition).  Work and work Astronomer.  Pregnancy and menstrual history. What immunizations do I need?  Influenza (flu) vaccine  This is recommended every year. Tetanus, diphtheria, and pertussis (Tdap) vaccine  You may need a Td booster every 10 years. Varicella (chickenpox) vaccine  You may need this vaccine if you have not already been vaccinated. Zoster (shingles) vaccine  You may need this after age 94. Pneumococcal conjugate (PCV13) vaccine  One dose is recommended after age 56. Pneumococcal polysaccharide (PPSV23) vaccine  One dose is recommended after age 25. Measles, mumps, and rubella (MMR) vaccine  You may need at least one dose of MMR if you were born in 1957 or later. You may also need a second dose. Meningococcal conjugate (MenACWY) vaccine  You may need  this if you have certain conditions. Hepatitis A vaccine  You may need this if you have certain conditions or if you travel or work in places where you may be exposed to hepatitis A. Hepatitis B vaccine  You may need this if you have certain conditions or if you travel or work in places where you may be exposed to hepatitis B. Haemophilus influenzae type b (Hib) vaccine  You may need this if you have certain conditions. You may receive vaccines as individual doses or as more than one vaccine together in one shot (combination vaccines). Talk with your health care provider about the risks and benefits of combination vaccines. What tests do I need? Blood tests  Lipid and cholesterol levels. These may be checked every 5 years, or more frequently depending on your overall health.  Hepatitis C test.  Hepatitis B test. Screening  Lung cancer screening. You may have this screening every year starting at age 22 if you have a 30-pack-year history of smoking and currently smoke or have quit within the past 15 years.  Colorectal cancer screening. All adults should have this screening starting at age 56 and continuing until age 65. Your health care provider may recommend screening at age 57 if you are at increased risk. You will have tests every 1-10 years, depending on your results and the type of screening test.  Diabetes screening. This is done by checking your blood sugar (glucose) after you have not eaten for a while (fasting). You may have this done every 1-3 years.  Mammogram. This may be done every 1-2 years. Talk with your health care  provider about how often you should have regular mammograms.  BRCA-related cancer screening. This may be done if you have a family history of breast, ovarian, tubal, or peritoneal cancers. Other tests  Sexually transmitted disease (STD) testing.  Bone density scan. This is done to screen for osteoporosis. You may have this done starting at age 49. Follow  these instructions at home: Eating and drinking  Eat a diet that includes fresh fruits and vegetables, whole grains, lean protein, and low-fat dairy products. Limit your intake of foods with high amounts of sugar, saturated fats, and salt.  Take vitamin and mineral supplements as recommended by your health care provider.  Do not drink alcohol if your health care provider tells you not to drink.  If you drink alcohol: ? Limit how much you have to 0-1 drink a day. ? Be aware of how much alcohol is in your drink. In the [place] one drink equals one 12 oz bottle of beer (355 mL), one 5 oz glass of wine (148 mL), or one 1 oz glass of hard liquor (44 mL). Lifestyle  Take daily care of your teeth and gums.  Stay active. Exercise for at least 30 minutes on 5 or more days each week.  Do not use any products that contain nicotine or tobacco, such as cigarettes, e-cigarettes, and chewing tobacco. If you need help quitting, ask your health care provider.  If you are sexually active, practice safe sex. Use a condom or other form of protection in order to prevent STIs (sexually transmitted infections).  Talk with your health care provider about taking a low-dose aspirin or statin. What's next?  Go to your health care provider once a year for a well check visit.  Ask your health care provider how often you should have your eyes and teeth checked.  Stay up to date on all vaccines. This information is not intended to replace advice given to you by your health care provider. Make sure you discuss any questions you have with your health care provider. Document Revised: 07/01/2018 Document Reviewed: 07/01/2018 Elsevier Patient Education  2020 [employer].

## 2020-04-10 NOTE — Progress Notes (Signed)
Established Patient Office Visit     This visit occurred during the SARS-CoV-2 public health emergency.  Safety protocols were in place, including screening questions prior to the visit, additional usage of staff PPE, and extensive cleaning of exam room while observing appropriate contact time as indicated for disinfecting solutions.    CC/Reason for Visit: Annual preventive exam and subsequent Medicare wellness visit  HPI: [name] is a 69 y.o. female who is coming in today for the above mentioned reasons. Past Medical History is significant for: Vitamin D and vitamin B12 deficiency, osteoporosis on Prolia, moderate protein caloric malnutrition, diverticular disease with intra-abdominal abscess status post recent sigmoid colectomy.  She has been doing well postoperatively, no significant complaints.  Her appetite has been picking up.  She is up-to-date on all cancer screenings.  She is fully vaccinated against Covid.  She is requesting flu and pneumonia vaccines today.   Past Medical/Surgical History: Past Medical History:  Diagnosis Date  . Alcoholism ([place]) 02/02/2009  . ANXIETY 01/21/2007  . Arthritis   . Blood transfusion    hx of 2005   . Cognitive communication disorder    per husband has some cognitive issues   . Depression   . Diverticulitis   . Diverticulitis of large intestine with abscess 04/05/2019  . DIVERTICULOSIS 07/09/2007  . Duodenal ulcer hemorrhage perforated 2006  . GERD (gastroesophageal reflux disease)   . Headache(784.0) 05/31/2007   pt denies at 08/12/11 preop visit   . HYPERTENSION 06/23/2007  . HYPONATREMIA 02/02/2009   better off diuretic  . INSOMNIA 05/31/2007  . Iron deficiency anemia    pt denies at 08/12/11 visit   . Marginal ulcer, at gastrojejunostomy, resected 08/14/2011. 09/19/2011  . NSAID-induced duodenal ulcer   . OSTEOPOROSIS 01/21/2007  . Raynaud's syndrome 06/23/2007  . Recurrent upper respiratory infection (URI)    pt reports  slight cold using Nyquil prn at bedtime   . Urinary tract infection 11/23/2019  . VITAMIN B12 DEFICIENCY 10/02/2009  . Zoster 2012   twice - left groin/vagina    Past Surgical History:  Procedure Laterality Date  . COLECTOMY N/A 03/09/2020   Procedure: OPEN SIGMOID COLECTOMY WITH RIGID PROCTOSCOPY;  Surgeon: Leighton Ruff, MD;  Location: [facility];  Service: General;  Laterality: N/A;  . COLONOSCOPY  06/18/2007   angulated and stenotic sigmoid colon with severe sigmoid diverticulosis  . COLONOSCOPY WITH PROPOFOL N/A 01/26/2020   Procedure: COLONOSCOPY WITH PROPOFOL;  Surgeon: Milus Banister, MD;  Location: [facility];  Service: Endoscopy;  Laterality: N/A;  ultraslim scope  . CYSTOSCOPY WITH STENT PLACEMENT Bilateral 03/09/2020   Procedure: CYSTOSCOPY WITH BILATERAL OPEN ENDED CATHETER PLACEMENT;  Surgeon: Lucas Mallow, MD;  Location: [facility];  Service: Urology;  Laterality: Bilateral;  . EXPLORATORY LAPAROTOMY  08/03/2004   1) duodenal ulcer oversew, pyloroplasty, anterior, posterior and truncal vagotomies  . EXPLORATORY LAPAROTOMY  10/20/2004   1) duodenal ulcer oversew  with exclusion2) side-side gastrojejunostomy 3) feeding jejunostomy  . IR RADIOLOGIST EVAL & MGMT  04/26/2019  . IR RADIOLOGIST EVAL & MGMT  05/10/2019  . IR RADIOLOGIST EVAL & MGMT  06/07/2019  . IR RADIOLOGIST EVAL & MGMT  12/06/2019  . IR RADIOLOGIST EVAL & MGMT  12/22/2019  . IR RADIOLOGIST EVAL & MGMT  01/10/2020  . IR RADIOLOGIST EVAL & MGMT  01/31/2020  . IR SINUS/FIST TUBE CHK-NON GI  04/12/2019  . LAPAROSCOPY  08/14/2011   Procedure: LAPAROSCOPY DIAGNOSTIC;  Surgeon: Fenton Malling  Lucia Gaskins, MD;  Location: [facility];  Service: General;  Laterality: N/A;  . LAPAROTOMY  08/14/2011   Procedure: EXPLORATORY LAPAROTOMY;  Surgeon: Shann Medal, MD;  Location: [facility];  Service: General;  Laterality: N/A;  exploratory laparotomy, lysis of adhesions, revision gastrojejunostomy, upper endoscopy  . OPEN REDUCTION INTERNAL FIXATION (ORIF)  DISTAL RADIAL FRACTURE Right 05/01/2016   Procedure: OPEN REDUCTION INTERNAL FIXATION (ORIF) DISTAL RADIAL FRACTURE, right;  Surgeon: Leanora Cover, MD;  Location: [facility];  Service: Orthopedics;  Laterality: Right;  . TUBAL LIGATION  1977  . UPPER GASTROINTESTINAL ENDOSCOPY  09/16/2007; 12/21/2008; 03/17/2011   2009: Normal 2010: Normal with enteroscopy2012: Gastrojejunal  anastomotic ulcer, gastric retention    Social History:  reports that she quit smoking about 5 years ago. She has never used smokeless tobacco. She reports previous alcohol use of about 7.0 standard drinks of alcohol per week. She reports that she does not use drugs.  Allergies: Allergies  Allergen Reactions  . Sulfa Antibiotics Anaphylaxis and Rash  . Bee Venom Swelling  . Dilaudid [Hydromorphone Hcl] Hives  . Penicillins Other (See Comments)    Has patient had a PCN reaction causing immediate rash, facial/tongue/throat swelling, SOB or lightheadedness with hypotension: No Has patient had a PCN reaction causing severe rash involving mucus membranes or skin necrosis: No Has patient had a PCN reaction that required hospitalization No Has patient had a PCN reaction occurring within the last 10 years: No If all of the above answers are "NO", then may proceed with Cephalosporin use.    Family History:  Family History  Problem Relation Age of Onset  . Stroke Father   . Colon cancer Neg Hx   . Esophageal cancer Neg Hx   . Pancreatic cancer Neg Hx   . Liver disease Neg Hx   . Colon polyps Neg Hx      Current Outpatient Medications:  .  acetaminophen (TYLENOL) 500 MG tablet, Take 500 mg by mouth every 6 (six) hours as needed., Disp: , Rfl:  .  calcium carbonate (OS-CAL - DOSED IN MG OF ELEMENTAL CALCIUM) 1250 (500 Ca) MG tablet, Take 1 tablet by mouth daily. , Disp: , Rfl:  .  denosumab (PROLIA) 60 MG/ML SOSY injection, Inject 60 mg into the skin every 6 (six) months., Disp: , Rfl:  .  folic acid  (FOLVITE) 1 MG tablet, Take 1 tablet (1 mg total) by mouth daily. (Patient taking differently: Take 1 mg by mouth every morning. ), Disp: 30 tablet, Rfl: 11 .  gabapentin (NEURONTIN) 100 MG capsule, TAKE 1 CAPSULE BY MOUTH TWICE A DAY (Patient taking differently: Take 100 mg by mouth 2 (two) times daily. ), Disp: 180 capsule, Rfl: 1 .  Multiple Vitamin (MULTIVITAMIN WITH MINERALS) TABS tablet, Take 1 tablet by mouth daily. Centrum Silver, Disp: , Rfl:  .  oxyCODONE (OXY IR/ROXICODONE) 5 MG immediate release tablet, Take 1 tablet (5 mg total) by mouth every 6 (six) hours as needed for moderate pain, severe pain or breakthrough pain., Disp: 15 tablet, Rfl: 0 .  pantoprazole (PROTONIX) 40 MG tablet, TAKE 1 TABLET BY MOUTH EVERY DAY (Patient taking differently: Take 40 mg by mouth every morning. ), Disp: 90 tablet, Rfl: 1 .  thiamine 100 MG tablet, Take 1 tablet (100 mg total) by mouth daily. (Patient taking differently: Take 100 mg by mouth every morning. ), Disp: 30 tablet, Rfl: 2  Review of Systems:  Constitutional: Denies fever, chills, diaphoresis, appetite change and fatigue.  HEENT: Denies photophobia, eye pain, redness, hearing loss, ear pain, congestion, sore throat, rhinorrhea, sneezing, mouth sores, trouble swallowing, neck pain, neck stiffness and tinnitus.   Respiratory: Denies SOB, DOE, cough, chest tightness,  and wheezing.   Cardiovascular: Denies chest pain, palpitations and leg swelling.  Gastrointestinal: Denies nausea, vomiting, abdominal pain, diarrhea, constipation, blood in stool and abdominal distention.  Genitourinary: Denies dysuria, urgency, frequency, hematuria, flank pain and difficulty urinating.  Endocrine: Denies: hot or cold intolerance, sweats, changes in hair or nails, polyuria, polydipsia. Musculoskeletal: Denies myalgias, back pain, joint swelling, arthralgias and gait problem.  Skin: Denies pallor, rash and wound.  Neurological: Denies dizziness, seizures, syncope,  weakness, light-headedness, numbness and headaches.  Hematological: Denies adenopathy. Easy bruising, personal or family bleeding history  Psychiatric/Behavioral: Denies suicidal ideation, mood changes, confusion, nervousness, sleep disturbance and agitation    Physical Exam: Vitals:   04/10/20 0749  BP: 130/80  Pulse: 62  Temp: 97.8 F (36.6 C)  TempSrc: Oral  SpO2: 98%  Weight: 106 lb 3.2 oz (48.2 kg)  Height: 5' 2.5" (1.588 m)    Body mass index is 19.11 kg/m.   Constitutional: NAD, calm, comfortable Eyes: PERRL, lids and conjunctivae normal ENMT: Mucous membranes are moist.Tympanic membrane is pearly white, no erythema or bulging. Neck: normal, supple, no masses, no thyromegaly Respiratory: clear to auscultation bilaterally, no wheezing, no crackles. Normal respiratory effort. No accessory muscle use.  Cardiovascular: Regular rate and rhythm, no murmurs / rubs / gallops. No extremity edema. 2+ pedal pulses. No carotid bruits.  Abdomen: no tenderness, no masses palpated. No hepatosplenomegaly. Bowel sounds positive.  Musculoskeletal: no clubbing / cyanosis. No joint deformity upper and lower extremities. Good ROM, no contractures. Normal muscle tone.  Skin: no rashes, lesions, ulcers. No induration Neurologic: CN 2-12 grossly intact. Sensation intact, DTR normal. Strength 5/5 in all 4.  Psychiatric: Normal judgment and insight. Alert and oriented x 3. Normal mood.   Subsequent Medicare wellness visit   1. Risk factors, based on past  M,S,F -cardiovascular disease risk factors include age only   2.  Physical activities: Walks on a daily basis   3.  Depression/mood:  Stable, not depressed   4.  Hearing:  No perceived issues   5.  ADL's: Independent in all ADLs   6.  Fall risk:  Moderate fall risk   7.  Home safety: No problems identified   8.  Height weight, and visual acuity: Height and weight as above, visual acuity is 20/30 with each eye independently and  compliant   9.  Counseling:  Advised to increase food consumption especially protein sources   10. Lab orders based on risk factors: Laboratory update will be reviewed   11. Referral :  None today   12. Care plan:  Follow-up with me in 6 months   13. Cognitive assessment:  No cognitive impairment   14. Screening: Patient provided with a written and personalized 5-10 year screening schedule in the AVS.   yes   15. Provider List Update:   PCP, GYN, general surgery  16. Advance Directives: Full code     Office Visit from 04/10/2020 in [facility] at [facility]  PHQ-9 Total Score 0      Fall Risk  04/10/2020 12/13/2019 04/11/2014 09/12/2013  Falls in the past year? 0 1 Yes No  Number falls in past yr: 0 1 1 -  Injury with Fall? 0 0 No -  Risk for fall due to : - - Impaired balance/gait -  Impression and Plan:  Encounter for preventive health examination  -Have advised routine eye and dental care. -Flu and Prevnar today, otherwise immunizations are up-to-date including Covid. -Healthy lifestyle discussed in detail. -Screening labs today. -Pap smear, mammogram and colonoscopy are up-to-date  Age-related osteoporosis without current pathological fracture -Receiving Prolia, due for repeat DEXA in November 2022  Alcoholism [facility]) -She has quit drinking.  B12 deficiency  - Plan: Vitamin B12  Vitamin D deficiency  - Plan: VITAMIN D 25 Hydroxy (Vit-D Deficiency, Fractures)  Diverticular disease Abscess of sigmoid colon due to diverticulitis -Status post sigmoid colectomy.  Iron deficiency anemia, unspecified iron deficiency anemia type  - Plan: CBC with Differential/Platelet  Malnutrition of moderate degree -She states she has a good appetite, it decreased some around the time of her surgery but is picking up again.  Her weight has been stable at around 106 for a BMI of 19.  Need for influenza vaccination -Flu vaccine administered today  Need for prophylactic  vaccination against Streptococcus pneumoniae (pneumococcus) -Prevnar administered today   Patient Instructions  -Nice seeing you today!!  -Lab work today; will notify you once results are available.  -Flu and Pneumonia vaccines today.  -Schedule follow up in 6 months.   Preventive Care 12 Years and Older, Female Preventive care refers to lifestyle choices and visits with your health care provider that can promote health and wellness. This includes:  A yearly physical exam. This is also called an annual well check.  Regular dental and eye exams.  Immunizations.  Screening for certain conditions.  Healthy lifestyle choices, such as diet and exercise. What can I expect for my preventive care visit? Physical exam Your health care provider will check:  Height and weight. These may be used to calculate body mass index (BMI), which is a measurement that tells if you are at a healthy weight.  Heart rate and blood pressure.  Your skin for abnormal spots. Counseling Your health care provider may ask you questions about:  Alcohol, tobacco, and drug use.  Emotional well-being.  Home and relationship well-being.  Sexual activity.  Eating habits.  History of falls.  Memory and ability to understand (cognition).  Work and work Statistician.  Pregnancy and menstrual history. What immunizations do I need?  Influenza (flu) vaccine  This is recommended every year. Tetanus, diphtheria, and pertussis (Tdap) vaccine  You may need a Td booster every 10 years. Varicella (chickenpox) vaccine  You may need this vaccine if you have not already been vaccinated. Zoster (shingles) vaccine  You may need this after age 50. Pneumococcal conjugate (PCV13) vaccine  One dose is recommended after age 75. Pneumococcal polysaccharide (PPSV23) vaccine  One dose is recommended after age 55. Measles, mumps, and rubella (MMR) vaccine  You may need at least one dose of MMR if you were  born in 1957 or later. You may also need a second dose. Meningococcal conjugate (MenACWY) vaccine  You may need this if you have certain conditions. Hepatitis A vaccine  You may need this if you have certain conditions or if you travel or work in places where you may be exposed to hepatitis A. Hepatitis B vaccine  You may need this if you have certain conditions or if you travel or work in places where you may be exposed to hepatitis B. Haemophilus influenzae type b (Hib) vaccine  You may need this if you have certain conditions. You may receive vaccines as individual doses or as more than one vaccine together in  one shot (combination vaccines). Talk with your health care provider about the risks and benefits of combination vaccines. What tests do I need? Blood tests  Lipid and cholesterol levels. These may be checked every 5 years, or more frequently depending on your overall health.  Hepatitis C test.  Hepatitis B test. Screening  Lung cancer screening. You may have this screening every year starting at age 9 if you have a 30-pack-year history of smoking and currently smoke or have quit within the past 15 years.  Colorectal cancer screening. All adults should have this screening starting at age 78 and continuing until age 48. Your health care provider may recommend screening at age 79 if you are at increased risk. You will have tests every 1-10 years, depending on your results and the type of screening test.  Diabetes screening. This is done by checking your blood sugar (glucose) after you have not eaten for a while (fasting). You may have this done every 1-3 years.  Mammogram. This may be done every 1-2 years. Talk with your health care provider about how often you should have regular mammograms.  BRCA-related cancer screening. This may be done if you have a family history of breast, ovarian, tubal, or peritoneal cancers. Other tests  Sexually transmitted disease (STD)  testing.  Bone density scan. This is done to screen for osteoporosis. You may have this done starting at age 66. Follow these instructions at home: Eating and drinking  Eat a diet that includes fresh fruits and vegetables, whole grains, lean protein, and low-fat dairy products. Limit your intake of foods with high amounts of sugar, saturated fats, and salt.  Take vitamin and mineral supplements as recommended by your health care provider.  Do not drink alcohol if your health care provider tells you not to drink.  If you drink alcohol: ? Limit how much you have to 0-1 drink a day. ? Be aware of how much alcohol is in your drink. In the [place] one drink equals one 12 oz bottle of beer (355 mL), one 5 oz glass of wine (148 mL), or one 1 oz glass of hard liquor (44 mL). Lifestyle  Take daily care of your teeth and gums.  Stay active. Exercise for at least 30 minutes on 5 or more days each week.  Do not use any products that contain nicotine or tobacco, such as cigarettes, e-cigarettes, and chewing tobacco. If you need help quitting, ask your health care provider.  If you are sexually active, practice safe sex. Use a condom or other form of protection in order to prevent STIs (sexually transmitted infections).  Talk with your health care provider about taking a low-dose aspirin or statin. What's next?  Go to your health care provider once a year for a well check visit.  Ask your health care provider how often you should have your eyes and teeth checked.  Stay up to date on all vaccines. This information is not intended to replace advice given to you by your health care provider. Make sure you discuss any questions you have with your health care provider. Document Revised: 07/01/2018 Document Reviewed: 07/01/2018 Elsevier Patient Education  2020 [place], [place] [facility]

## 2020-04-10 NOTE — Addendum Note (Signed)
Addended by: Kern Reap B on: 04/10/2020 04:11 PM   Modules accepted: Orders

## 2020-04-11 LAB — CBC WITH DIFFERENTIAL/PLATELET
Absolute Monocytes: 828 cells/uL (ref 200–950)
Basophils Absolute: 50 cells/uL (ref 0–200)
Basophils Relative: 0.7 %
Eosinophils Absolute: 338 cells/uL (ref 15–500)
Eosinophils Relative: 4.7 %
HCT: 39.6 % (ref 35.0–45.0)
Hemoglobin: 13.5 g/dL (ref 11.7–15.5)
Lymphs Abs: 2131 cells/uL (ref 850–3900)
MCH: 32.5 pg (ref 27.0–33.0)
MCHC: 34.1 g/dL (ref 32.0–36.0)
MCV: 95.2 fL (ref 80.0–100.0)
MPV: 10.2 fL (ref 7.5–12.5)
Monocytes Relative: 11.5 %
Neutro Abs: 3852 cells/uL (ref 1500–7800)
Neutrophils Relative %: 53.5 %
Platelets: 260 10*3/uL (ref 140–400)
RBC: 4.16 10*6/uL (ref 3.80–5.10)
RDW: 13.3 % (ref 11.0–15.0)
Total Lymphocyte: 29.6 %
WBC: 7.2 10*3/uL (ref 3.8–10.8)

## 2020-04-11 LAB — HEMOGLOBIN A1C
Hgb A1c MFr Bld: 5.4 % of total Hgb (ref <5.7)
Mean Plasma Glucose: 108 (calc)
eAG (mmol/L): 6 (calc)

## 2020-04-11 LAB — TSH: TSH: 1.75 mIU/L (ref 0.40–4.50)

## 2020-04-11 LAB — COMPREHENSIVE METABOLIC PANEL
AG Ratio: 1.8 (calc) (ref 1.0–2.5)
ALT: 11 U/L (ref 6–29)
AST: 16 U/L (ref 10–35)
Albumin: 4.4 g/dL (ref 3.6–5.1)
Alkaline phosphatase (APISO): 62 U/L (ref 37–153)
BUN: 17 mg/dL (ref 7–25)
CO2: 28 mmol/L (ref 20–32)
Calcium: 9.4 mg/dL (ref 8.6–10.4)
Chloride: 92 mmol/L — ABNORMAL LOW (ref 98–110)
Creat: 0.58 mg/dL (ref 0.50–0.99)
Globulin: 2.5 g/dL (calc) (ref 1.9–3.7)
Glucose, Bld: 96 mg/dL (ref 65–99)
Potassium: 3.9 mmol/L (ref 3.5–5.3)
Sodium: 130 mmol/L — ABNORMAL LOW (ref 135–146)
Total Bilirubin: 0.6 mg/dL (ref 0.2–1.2)
Total Protein: 6.9 g/dL (ref 6.1–8.1)

## 2020-04-11 LAB — LIPID PANEL
Cholesterol: 206 mg/dL — ABNORMAL HIGH (ref <200)
HDL: 64 mg/dL (ref >=50)
LDL Cholesterol (Calc): 128 mg/dL (calc) — ABNORMAL HIGH
Non-HDL Cholesterol (Calc): 142 mg/dL (calc) — ABNORMAL HIGH (ref <130)
Total CHOL/HDL Ratio: 3.2 (calc) (ref <5.0)
Triglycerides: 57 mg/dL (ref <150)

## 2020-04-11 LAB — VITAMIN B12: Vitamin B-12: 370 pg/mL (ref 200–1100)

## 2020-04-11 LAB — VITAMIN D 25 HYDROXY (VIT D DEFICIENCY, FRACTURES): Vit D, 25-Hydroxy: 38 ng/mL (ref 30–100)

## 2020-04-16 ENCOUNTER — Other Ambulatory Visit: Payer: Self-pay | Admitting: Internal Medicine

## 2020-04-16 DIAGNOSIS — K5720 Diverticulitis of large intestine with perforation and abscess without bleeding: Secondary | ICD-10-CM

## 2020-04-20 ENCOUNTER — Other Ambulatory Visit: Payer: Self-pay | Admitting: Internal Medicine

## 2020-04-20 DIAGNOSIS — K5720 Diverticulitis of large intestine with perforation and abscess without bleeding: Secondary | ICD-10-CM

## 2020-04-24 ENCOUNTER — Telehealth: Payer: Self-pay | Admitting: *Deleted

## 2020-04-24 NOTE — Telephone Encounter (Signed)
Okay to schedule patient after 04/20/2020.  Husband is aware.

## 2020-04-30 ENCOUNTER — Other Ambulatory Visit: Payer: Self-pay

## 2020-04-30 ENCOUNTER — Ambulatory Visit (INDEPENDENT_AMBULATORY_CARE_PROVIDER_SITE_OTHER): Payer: PPO

## 2020-04-30 DIAGNOSIS — M810 Age-related osteoporosis without current pathological fracture: Secondary | ICD-10-CM | POA: Diagnosis not present

## 2020-04-30 MED ORDER — DENOSUMAB 60 MG/ML ~~LOC~~ SOSY
60.0000 mg | PREFILLED_SYRINGE | Freq: Once | SUBCUTANEOUS | Status: AC
  Administered 2020-04-30: 60 mg via SUBCUTANEOUS

## 2020-04-30 NOTE — Progress Notes (Signed)
Per orders of Dr. Ardyth Harps , injection of PROLIA 60 mg/mLgiven by Carola Rhine. Patient tolerated injection well.

## 2020-05-04 ENCOUNTER — Other Ambulatory Visit: Payer: Self-pay | Admitting: Internal Medicine

## 2020-05-04 DIAGNOSIS — F1020 Alcohol dependence, uncomplicated: Secondary | ICD-10-CM

## 2020-07-04 IMAGING — RF DG SINUS / FISTULA TRACT / ABSCESSOGRAM
3 series · 10 of 10 positions shown · non-contrast
Comparison: none

INDICATION: 68-year-old with history of diverticular abscess and status post
percutaneous drain placement. Previous drain injection demonstrates
a colonic fistula. Patient reports minimal output from the drain.

[Series 1: one shot · 1 of 1 slices shown (1 of 2)]
[im 1/1]
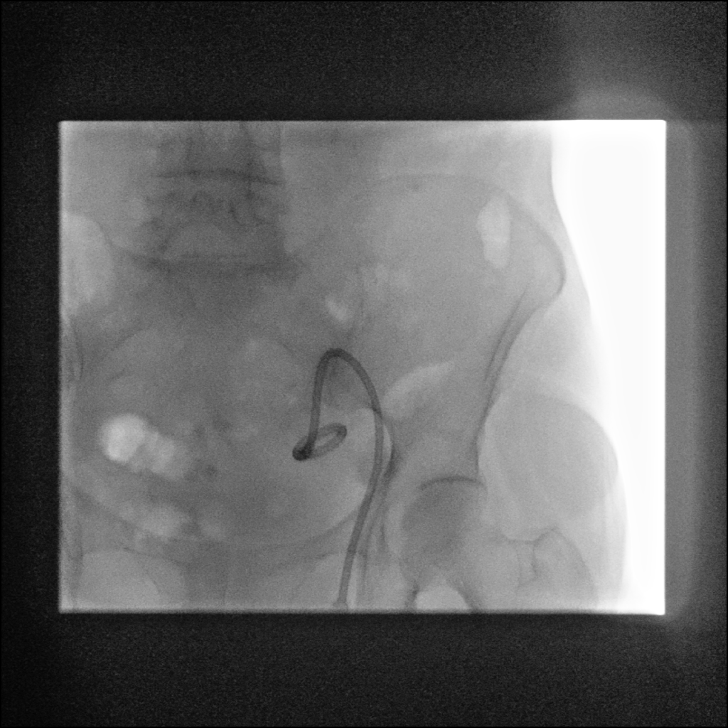

[Series 2: sequence · 4 of 19 frames shown]
[frame 1/19]
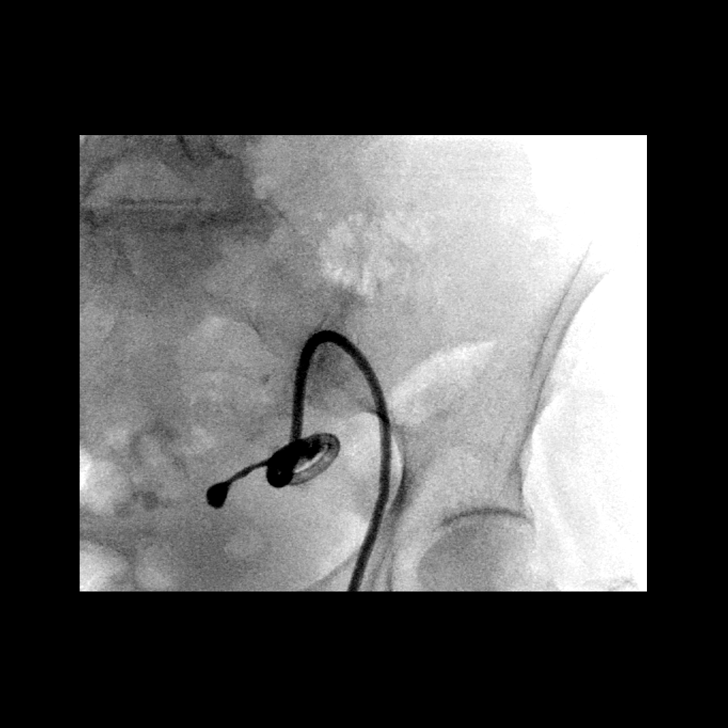
[frame 3/19]
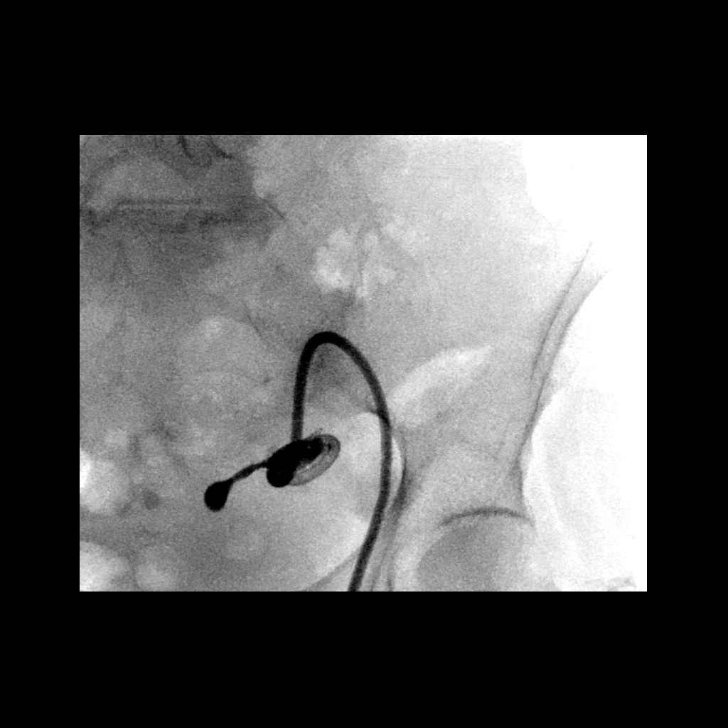
[frame 10/19]
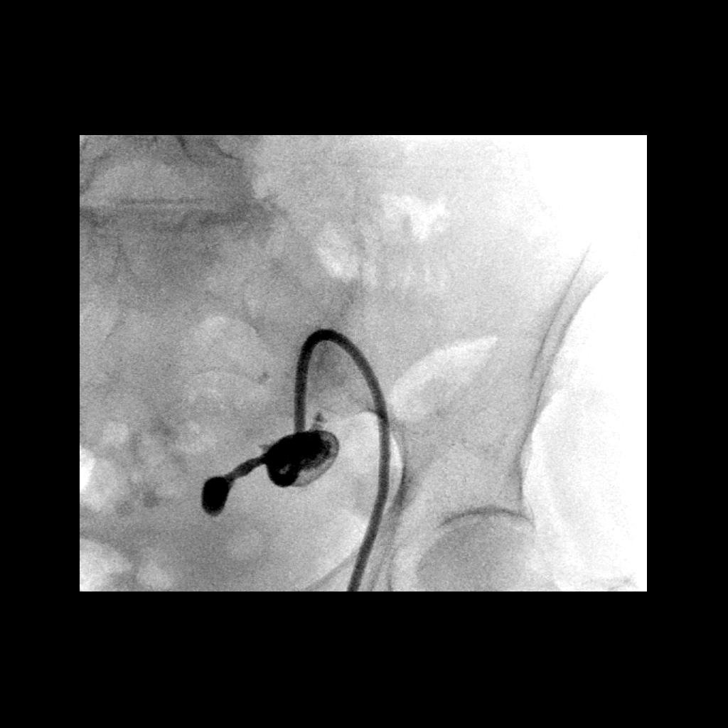
[frame 17/19]
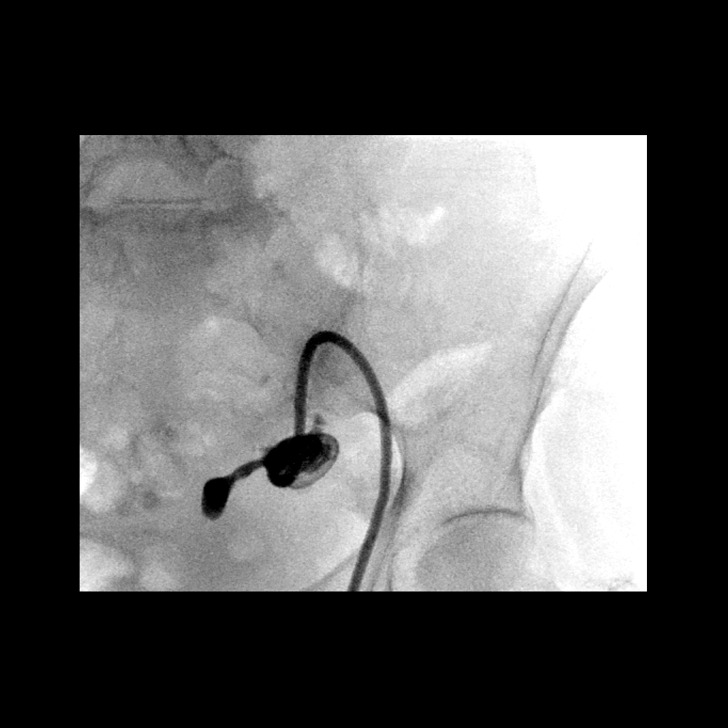

[Series 3: one shot · 5 of 5 slices shown (2 of 2)]
[im 1/5]
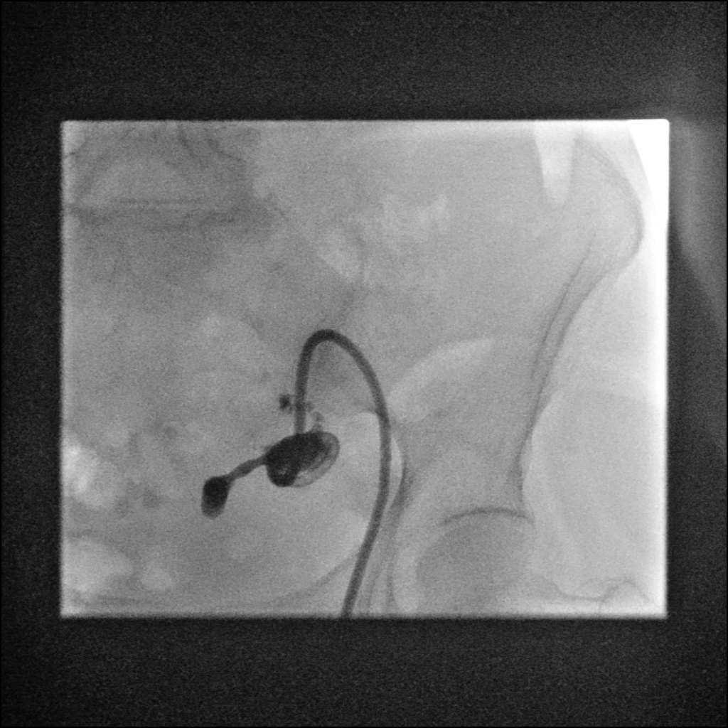
[im 2/5]
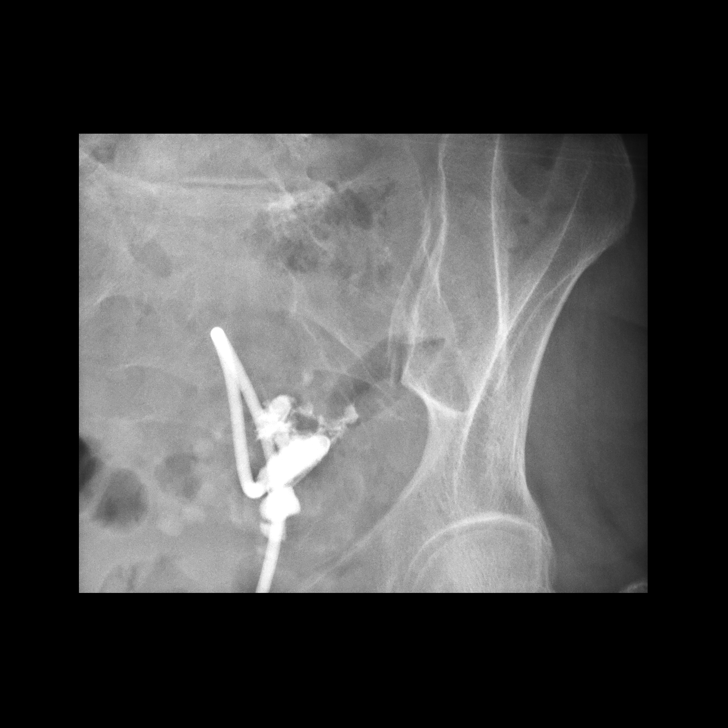
[im 3/5]
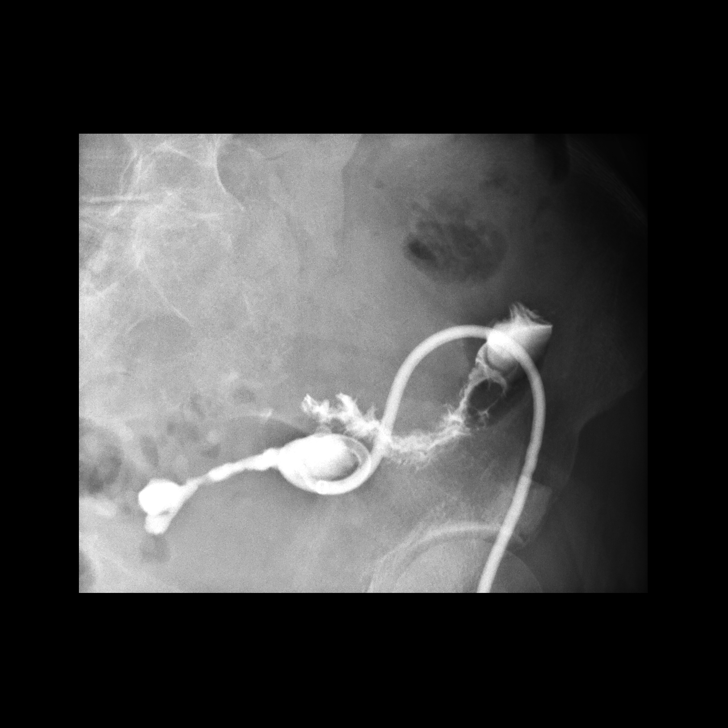
[im 4/5]
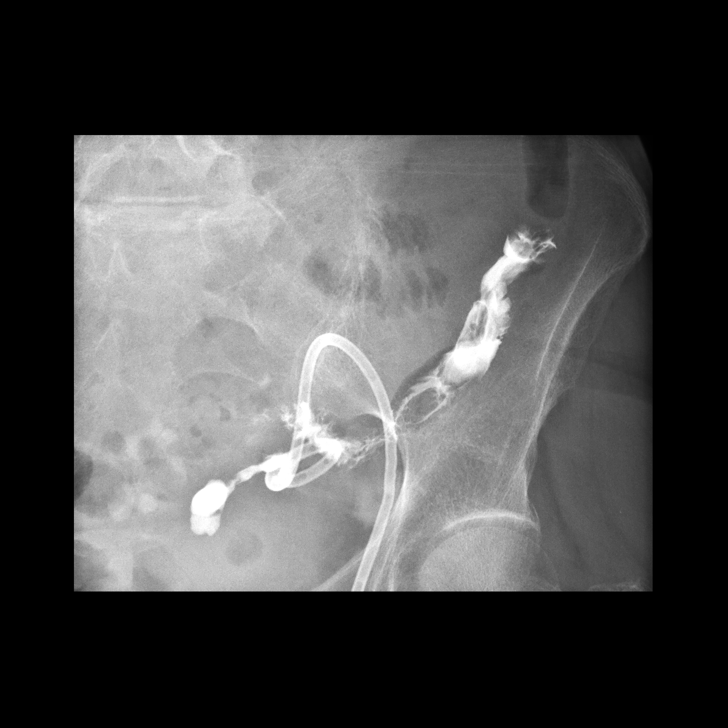
[im 5/5]
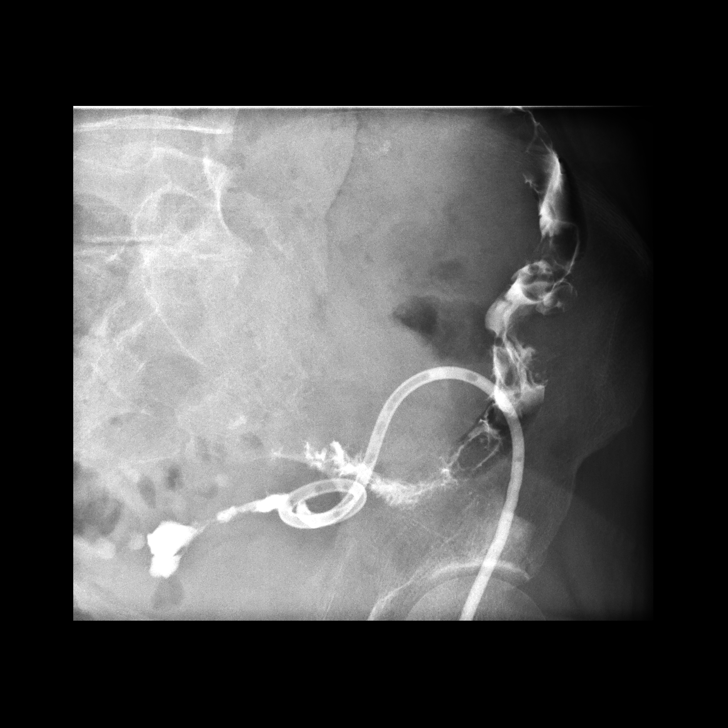

[10 of 10 positions shown; findings below may reference images not displayed]

EXAM:
DRAIN INJECTION WITH FLUOROSCOPY

MEDICATIONS:
None

ANESTHESIA/SEDATION:
None

FLUOROSCOPY TIME:  36 seconds, 20 mGy

CONTRAST:  20 mL Omnipaque 300

COMPLICATIONS:
None immediate.

PROCEDURE:
Patient was placed supine on the fluoroscopic table. Scout image was
obtained. Contrast was injected through the tube and multiple views
of the injection were obtained. Contrast was aspirated at the end of
the procedure. Catheter was flushed with normal saline and attached
to a gravity bag. A new StatLock was placed.
FINDINGS: Drain in the left hemipelvis has not significantly changed in
position. Injection of contrast again demonstrates a small residual
cavity towards the central aspect of the pelvis. In addition,
contrast fills the adjacent sigmoid colon. Contrast refluxes into
the descending colon.
IMPRESSION: 1. Persistent fistula to the sigmoid colon.
2. Persistent small residual cavity just medial and caudal to the
pigtail drain. This small collection has not significantly changed
from the previous examination.

## 2020-07-06 ENCOUNTER — Telehealth: Payer: Self-pay | Admitting: Internal Medicine

## 2020-07-06 NOTE — Progress Notes (Signed)
Please call Home number for appointment.    Chronic Care Management   Note  07/06/2020 Name: [name] MRN: [id number] DOB: 1950/10/24  [name] is a 70 y.o. year old female who is a primary care patient of Philip Aspen, Limmie Patricia, MD. I reached out to Nanda Quinton by phone today in response to a referral sent by [name]'s PCP, Philip Aspen, Limmie Patricia, MD.   [name] was given information about Chronic Care Management services today including:  1. CCM service includes personalized support from designated clinical staff supervised by her physician, including individualized plan of care and coordination with other care providers 2. 24/7 contact phone numbers for assistance for urgent and routine care needs. 3. Service will only be billed when office clinical staff spend 20 minutes or more in a month to coordinate care. 4. Only one practitioner may furnish and bill the service in a calendar month. 5. The patient may stop CCM services at any time (effective at the end of the month) by phone call to the office staff.   Patient agreed to services and verbal consent obtained.   Follow up plan:   Carley Perdue [employer] Scheduler

## 2020-07-25 IMAGING — RF DG SINUS / FISTULA TRACT / ABSCESSOGRAM
3 series · 7 of 7 positions shown · non-contrast
Comparison: none

INDICATION: 68-year-old with history of diverticular abscess and percutaneous
drain placement. History of colonic fistula.

[Series 1: one shot · 0.15mm/px · 1 of 1 slices shown (1 of 2)]
[im 1/1]
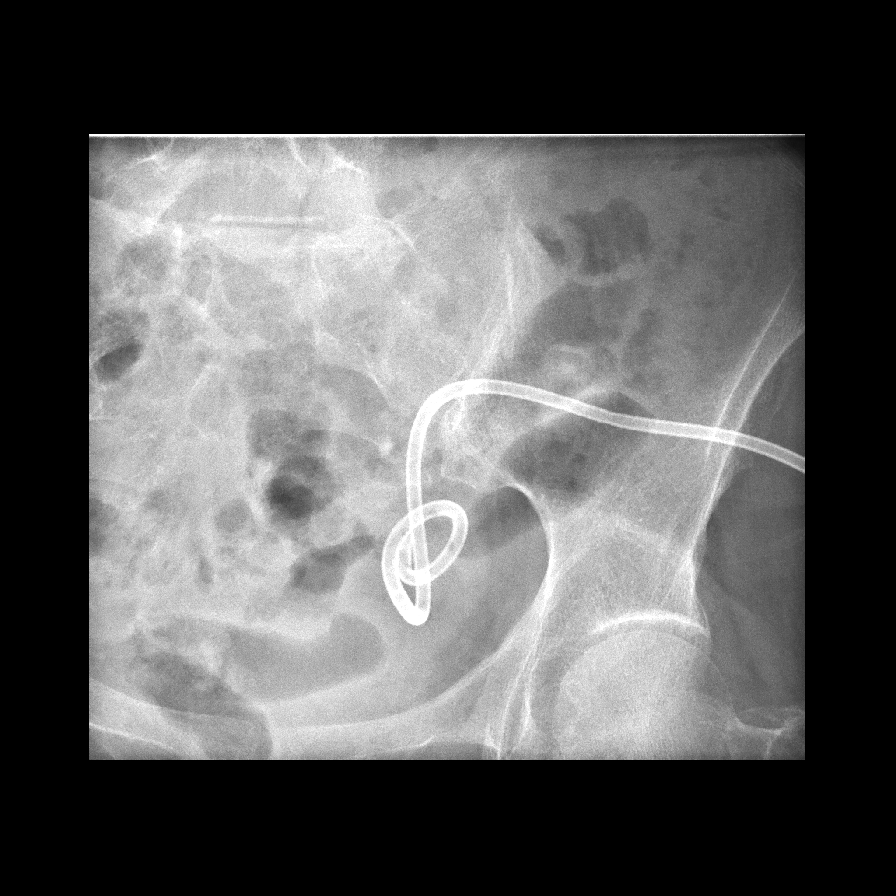

[Series 2: sequence · 4 of 43 frames shown]
[frame 4/43]
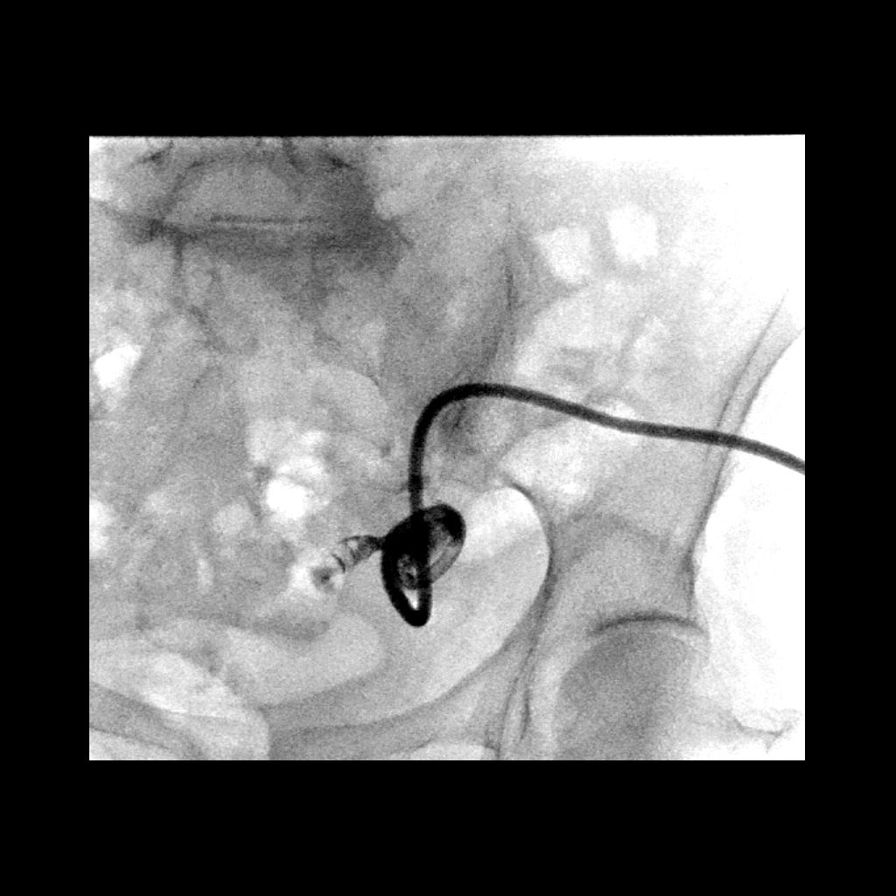
[frame 7/43]
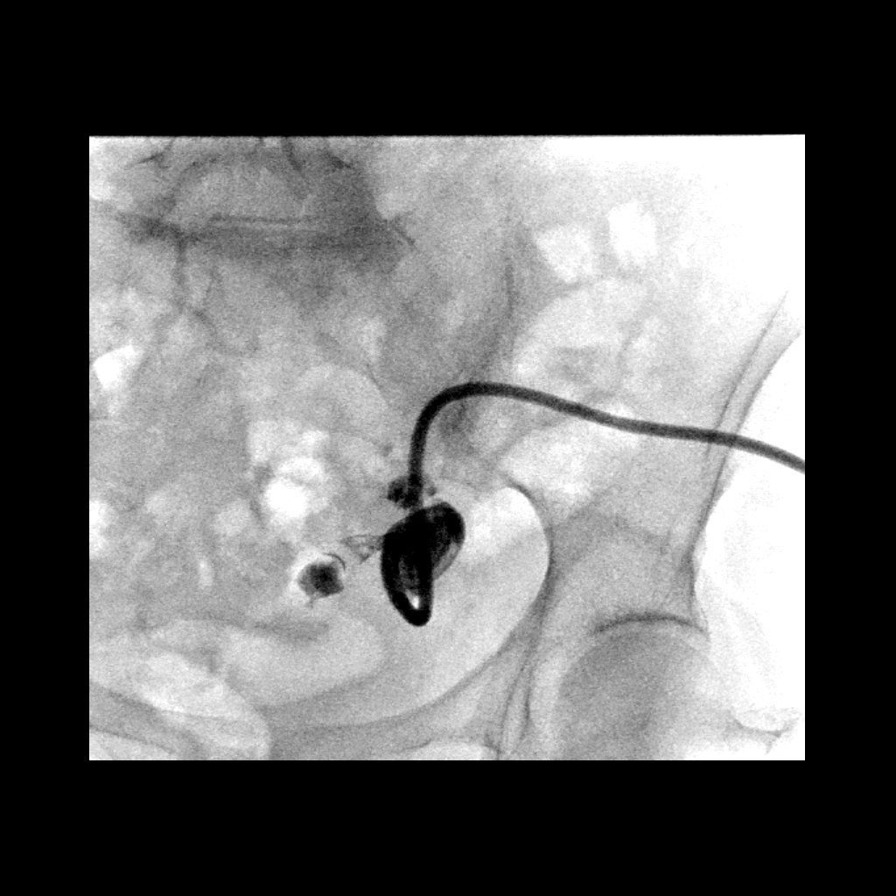
[frame 22/43]
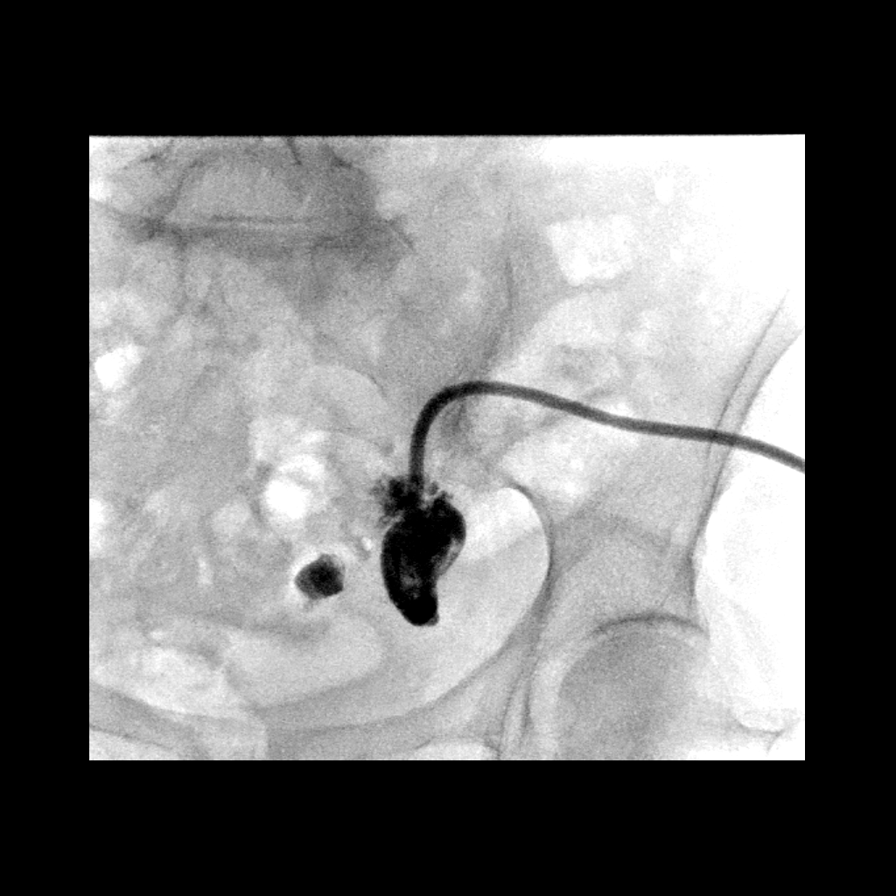
[frame 37/43]
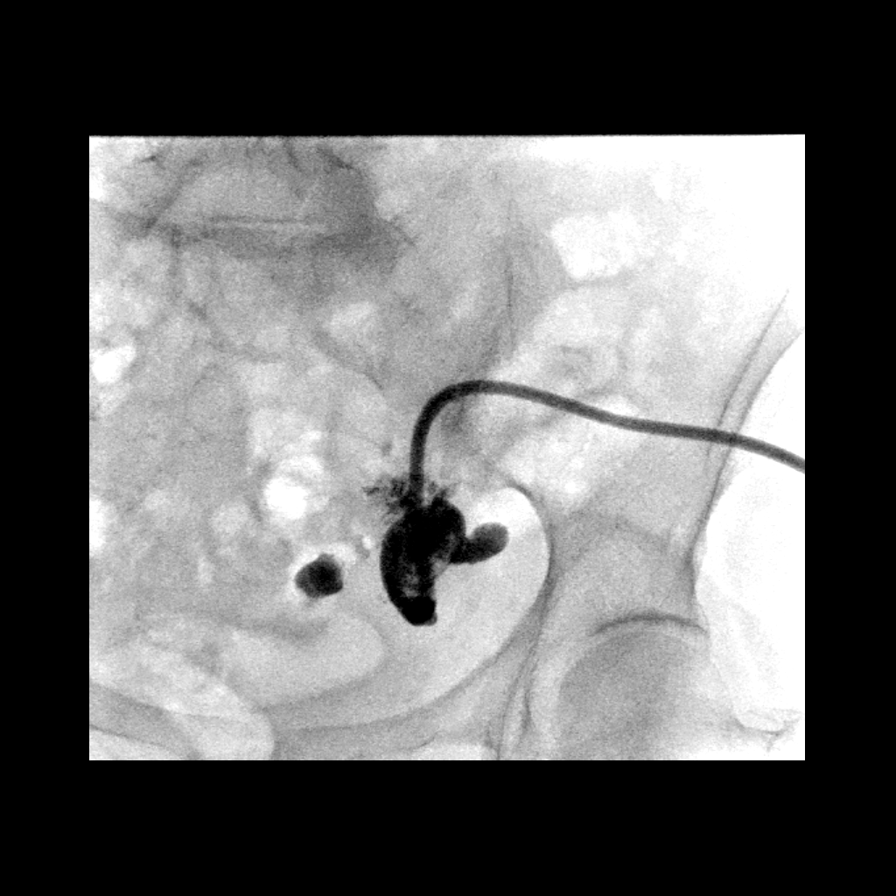

[Series 3: one shot · 0.15mm/px · 2 of 2 slices shown (2 of 2)]
[im 1/2]
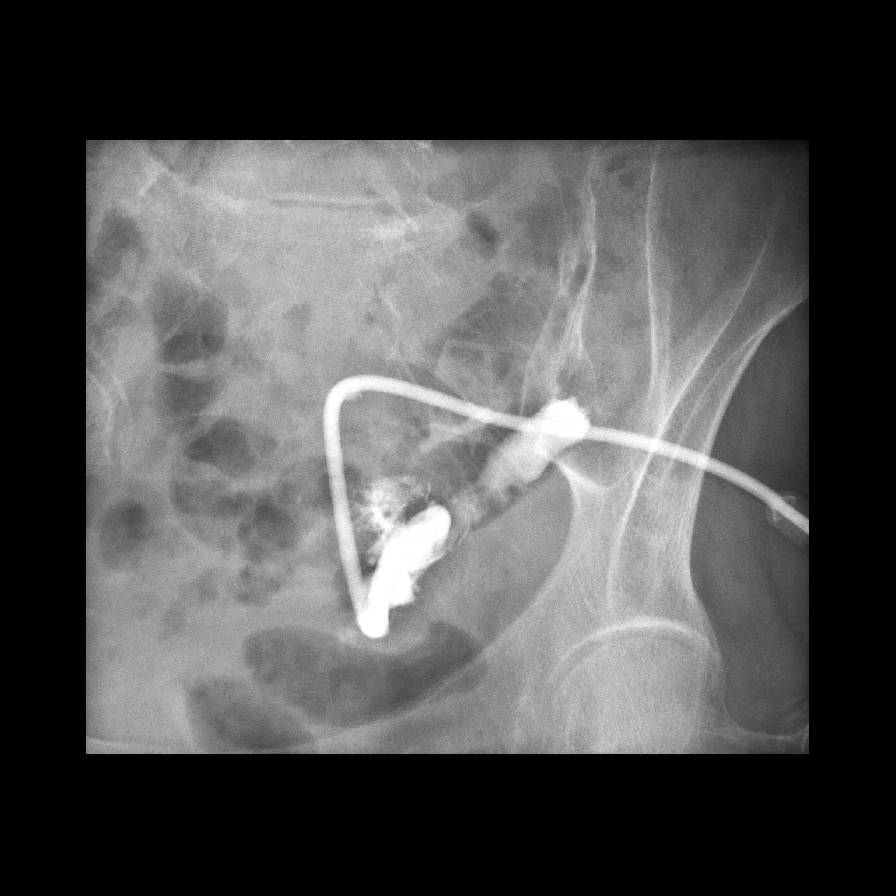
[im 2/2]
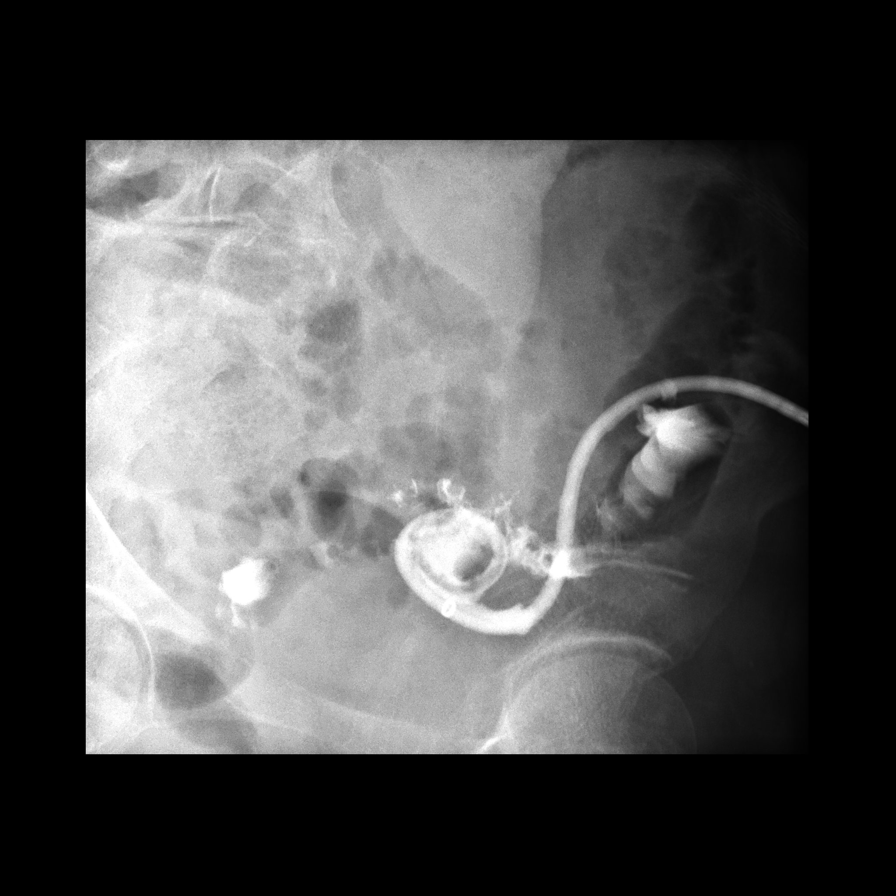

[7 of 7 positions shown; findings below may reference images not displayed]

EXAM:
DRAIN INJECTION WITH FLUOROSCOPY

MEDICATIONS:
None

ANESTHESIA/SEDATION:
None

FLUOROSCOPY TIME:  30 seconds, 22 mGy

COMPLICATIONS:
None immediate.

PROCEDURE:
Patient was placed supine on the fluoroscopic table. Approximately
10 mL Omnipaque 300 was injected through the drain. Drain was
flushed with saline at the end of the procedure. Drain was attached
to a gravity bag. New StatLock and dressing was placed.
FINDINGS: Scout image demonstrates at the left pelvic drain has not
significantly changed in position. Again noted is a small elongated
cavity just medial to the drain. Again noted is a fistula tract to
the adjacent sigmoid colon.
IMPRESSION: 1. Persistent fistula connection to the sigmoid colon.
2. Small residual cavity along the medial aspect of the drain.
Minimal change from the previous examination.

## 2020-08-28 ENCOUNTER — Telehealth: Payer: PPO

## 2020-09-04 ENCOUNTER — Telehealth: Payer: Self-pay | Admitting: Pharmacist

## 2020-09-04 NOTE — Chronic Care Management (AMB) (Signed)
Chronic Care Management Pharmacy Assistant   Name: Lesleyann Fichter  MRN: [id number] DOB: 1951/03/05  Reason for Encounter: Medication Review/Initial Questions for Pharmacist visit on 09-05-2020  Patient Questions:  1. Have you seen any other providers since your last visit? No 2. Any changes in your medications or health? No 3. Any side effects from any medications? No 4. Do you have any symptoms or problems not managed by your medications? No 5. Any concerns about your health right now? No 6. Has your provider asked that you check blood pressure, blood sugar, or follow a special diet at home? 7.  Do you get any type of exercise regularly?  . Some exercise 8. Can you think of a goal you would like to reach for your health? No 9. Do you have any problems getting your medications? No 10. Is there anything that you would like to discuss during the appointment? No  The patient was asked to please bring medications, blood pressure/ blood sugar log, and supplements to her appointment.    PCP : Philip Aspen, Limmie Patricia, MD  Allergies:   Allergies  Allergen Reactions  . Sulfa Antibiotics Anaphylaxis and Rash  . Bee Venom Swelling  . Dilaudid [Hydromorphone Hcl] Hives  . Penicillins Other (See Comments)    Has patient had a PCN reaction causing immediate rash, facial/tongue/throat swelling, SOB or lightheadedness with hypotension: No Has patient had a PCN reaction causing severe rash involving mucus membranes or skin necrosis: No Has patient had a PCN reaction that required hospitalization No Has patient had a PCN reaction occurring within the last 10 years: No If all of the above answers are "NO", then may proceed with Cephalosporin use.    Medications: Outpatient Encounter Medications as of 09/04/2020  Medication Sig Note  . acetaminophen (TYLENOL) 500 MG tablet Take 500 mg by mouth every 6 (six) hours as needed.   . calcium carbonate (OS-CAL - DOSED IN MG OF ELEMENTAL  CALCIUM) 1250 (500 Ca) MG tablet Take 1 tablet by mouth daily.    Marland Kitchen denosumab (PROLIA) 60 MG/ML SOSY injection Inject 60 mg into the skin every 6 (six) months. 02/15/2020: Last dose:10/2019  . folic acid (FOLVITE) 1 MG tablet TAKE 1 TABLET BY MOUTH EVERY DAY   . gabapentin (NEURONTIN) 100 MG capsule TAKE 1 CAPSULE BY MOUTH TWICE A DAY   . Multiple Vitamin (MULTIVITAMIN WITH MINERALS) TABS tablet Take 1 tablet by mouth daily. Centrum Silver   . oxyCODONE (OXY IR/ROXICODONE) 5 MG immediate release tablet Take 1 tablet (5 mg total) by mouth every 6 (six) hours as needed for moderate pain, severe pain or breakthrough pain.   . pantoprazole (PROTONIX) 40 MG tablet TAKE 1 TABLET BY MOUTH EVERY DAY   . thiamine 100 MG tablet Take 1 tablet (100 mg total) by mouth daily. (Patient taking differently: Take 100 mg by mouth every morning. )    No facility-administered encounter medications on file as of 09/04/2020.    Current Diagnosis: Patient Active Problem List   Diagnosis Date Noted  . Diverticular disease 03/09/2020  . Abscess of sigmoid colon due to diverticulitis 11/23/2019  . Long QT interval 11/23/2019  . Vitamin D deficiency 06/03/2019  . Dementia (HCC) 04/05/2019  . Malnutrition of moderate degree 08/23/2017  . Hyponatremia 08/20/2017  . Anemia 08/20/2017  . LUQ abdominal pain near former jejunostomy site 03/04/2011  . 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 SHINGLES 08/19/2010  . B12 deficiency  10/02/2009  . Iron deficiency anemia 07/09/2007  . DIVERTICULOSIS 07/09/2007  . Essential hypertension 06/23/2007  . 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 INSOMNIA 05/31/2007  . HEADACHE 05/31/2007  . Anxiety state 01/21/2007  . Alcoholism (HCC) 01/21/2007  . Osteoporosis 01/21/2007    Goals Addressed   None     Follow-Up:  Pharmacist Review   Berenice Bouton, [facility] Clinical Pharmacy Assistant [phone]

## 2020-09-05 ENCOUNTER — Ambulatory Visit (INDEPENDENT_AMBULATORY_CARE_PROVIDER_SITE_OTHER): Payer: PPO | Admitting: Pharmacist

## 2020-09-05 DIAGNOSIS — E538 Deficiency of other specified B group vitamins: Secondary | ICD-10-CM

## 2020-09-05 DIAGNOSIS — I10 Essential (primary) hypertension: Secondary | ICD-10-CM | POA: Diagnosis not present

## 2020-09-05 DIAGNOSIS — M810 Age-related osteoporosis without current pathological fracture: Secondary | ICD-10-CM

## 2020-09-05 NOTE — Progress Notes (Signed)
Chronic Care Management Pharmacy Note  09/17/2020 Name:  [name] MRN:  [id number] DOB:  15-Mar-1951  Subjective: [name] is an 70 y.o. year old female who is a primary patient of Isaac Bliss, Rayford Halsted, MD.  The CCM team was consulted for assistance with disease management and care coordination needs.    Engaged with patient by telephone for initial visit in response to provider referral for pharmacy case management and/or care coordination services.   Consent to Services:  The patient was given the following information about Chronic Care Management services today, agreed to services, and gave verbal consent: 1. CCM service includes personalized support from designated clinical staff supervised by the primary care provider, including individualized plan of care and coordination with other care providers 2. 24/7 contact phone numbers for assistance for urgent and routine care needs. 3. Service will only be billed when office clinical staff spend 20 minutes or more in a month to coordinate care. 4. Only one practitioner may furnish and bill the service in a calendar month. 5.The patient may stop CCM services at any time (effective at the end of the month) by phone call to the office staff. 6. The patient will be responsible for cost sharing (co-pay) of up to 20% of the service fee (after annual deductible is met). Patient agreed to services and consent obtained.  Patient Care Team: Isaac Bliss, Rayford Halsted, MD as PCP - General (Internal Medicine) Gatha Mayer, MD as Consulting Physician (Gastroenterology) Viona Gilmore, [facility] as Pharmacist (Pharmacist)  Recent office visits: 04/30/20 Rosealee Albee, CMA: Patient presented for Prolia injection.   04/10/20 [name], MD: Patient presented for annual exam. Prevnar and influenza vaccines administered.   Recent consult visits: 12/22/19 Patient presented for post hospital follow up after diverticulitis and  abscess.  Hospital visits: 03/09/20 Patient admitted for sigmoid colectomy with proctoscopy.  01/26/20 Patient admitted for colonoscopy.  Objective:  Lab Results  Component Value Date   CREATININE 0.58 04/10/2020   BUN 17 04/10/2020   GFR 125.67 12/13/2019   GFRNONAA >60 03/12/2020   GFRAA >60 03/12/2020   NA 130 (L) 04/10/2020   K 3.9 04/10/2020   CALCIUM 9.4 04/10/2020   CO2 28 04/10/2020    Lab Results  Component Value Date/Time   HGBA1C 5.4 04/10/2020 08:18 AM   HGBA1C 5.6 06/03/2019 07:49 AM   GFR 125.67 12/13/2019 01:59 PM   GFR 103.61 06/03/2019 07:49 AM    Last diabetic Eye exam: No results found for: HMDIABEYEEXA  Last diabetic Foot exam: No results found for: HMDIABFOOTEX   Lab Results  Component Value Date   CHOL 206 (H) 04/10/2020   HDL 64 04/10/2020   LDLCALC 128 (H) 04/10/2020   TRIG 57 04/10/2020   CHOLHDL 3.2 04/10/2020    Hepatic Function Latest Ref Rng & Units 04/10/2020 11/26/2019 11/25/2019  Total Protein 6.1 - 8.1 g/dL 6.9 5.2(L) 5.8(L)  Albumin 3.5 - 5.0 g/dL - 2.5(L) 2.9(L)  AST 10 - 35 U/L $Remo'16 18 23  'paawX$ ALT 6 - 29 U/L $Remo'11 13 18  'NsdtW$ Alk Phosphatase 38 - 126 U/L - 68 89  Total Bilirubin 0.2 - 1.2 mg/dL 0.6 0.4 1.3(H)  Bilirubin, Direct 0.0 - 0.2 mg/dL - - -    Lab Results  Component Value Date/Time   TSH 1.75 04/10/2020 08:18 AM   TSH 1.85 06/03/2019 07:49 AM    CBC Latest Ref Rng & Units 04/10/2020 03/12/2020 03/11/2020  WBC 3.8 - 10.8 Thousand/uL  7.2 10.5 11.6(H)  Hemoglobin 11.7 - 15.5 g/dL 13.5 11.7(L) 11.1(L)  Hematocrit 35.0 - 45.0 % 39.6 33.9(L) 33.1(L)  Platelets 140 - 400 Thousand/uL 260 282 246    Lab Results  Component Value Date/Time   VD25OH 38 04/10/2020 08:18 AM   VD25OH 31.43 12/13/2019 01:59 PM   VD25OH 22.43 (L) 06/03/2019 07:49 AM    Clinical ASCVD: No  The 10-year ASCVD risk score Mikey Bussing DC Jr., et al., 2013) is: 7.7%   Values used to calculate the score:     Age: 40 years     Sex: Female     Is Non-Hispanic African  American: No     Diabetic: No     Tobacco smoker: No     Systolic Blood Pressure: 299 mmHg     Is BP treated: No     HDL Cholesterol: 64 mg/dL     Total Cholesterol: 206 mg/dL    Depression screen [facility] 2/9 04/10/2020 06/03/2019 04/11/2014  Decreased Interest 0 0 0  Down, Depressed, Hopeless 0 0 0  PHQ - 2 Score 0 0 0  Altered sleeping 0 0 -  Tired, decreased energy 0 0 -  Change in appetite 0 0 -  Feeling bad or failure about yourself  0 0 -  Trouble concentrating 0 0 -  Moving slowly or fidgety/restless 0 0 -  Suicidal thoughts 0 0 -  PHQ-9 Score 0 0 -  Difficult doing work/chores Not difficult at all Not difficult at all -  Some recent data might be hidden      Social History   Tobacco Use  Smoking Status Former Smoker  . Quit date: 07/21/2014  . Years since quitting: 6.1  Smokeless Tobacco Never Used   BP Readings from Last 3 Encounters:  04/10/20 130/80  03/14/20 135/73  02/29/20 (!) 145/69   Pulse Readings from Last 3 Encounters:  04/10/20 62  03/14/20 64  02/29/20 (!) 53   Wt Readings from Last 3 Encounters:  04/10/20 106 lb 3.2 oz (48.2 kg)  03/13/20 108 lb 0.4 oz (49 kg)  02/29/20 112 lb (50.8 kg)    Assessment/Interventions: Review of patient past medical history, allergies, medications, health status, including review of consultants reports, laboratory and other test data, was performed as part of comprehensive evaluation and provision of chronic care management services.   SDOH:  (Social Determinants of Health) assessments and interventions performed: Yes   SDOH Interventions   Flowsheet Row Most Recent Value  SDOH Interventions   Financial Strain Interventions Intervention Not Indicated  Transportation Interventions Intervention Not Indicated      Spoke with patient's husband as he manages her medications and is her legal guardian.  Patient gets up and eats and does house hold chores and husband goes to the gym. Patient fixes her own breakfast of  a bowl or two of cereal and husband fixes supper mostly. Patient eats [place], chicken, tuna and salmon sometimes filet mignon and sometimes french fries, baked potatoes, and carrots. She drinks diet pepsi, gatorade, water and milk in her cereal. She doesn't eat many sweets and eats mint lifesavers every once in a while.   Patient goes for walks a couple times a week.   Patient sleeps pretty well and sometimes gets up early but will go back to sleep. Patient gets plenty of sleep and more than 8-9 hours per night. Patient does some napping during the day.  Patient medicines are working well and denies any side effects with current  medications.  SDOH Interventions   Flowsheet Row Most Recent Value  SDOH Interventions   Financial Strain Interventions Intervention Not Indicated  Transportation Interventions Intervention Not Indicated      CCM Care Plan  Allergies  Allergen Reactions  . Sulfa Antibiotics Anaphylaxis and Rash  . Bee Venom Swelling  . Dilaudid [Hydromorphone Hcl] Hives  . Penicillins Other (See Comments)    Has patient had a PCN reaction causing immediate rash, facial/tongue/throat swelling, SOB or lightheadedness with hypotension: No Has patient had a PCN reaction causing severe rash involving mucus membranes or skin necrosis: No Has patient had a PCN reaction that required hospitalization No Has patient had a PCN reaction occurring within the last 10 years: No If all of the above answers are "NO", then may proceed with Cephalosporin use.    Medications Reviewed Today    Reviewed by Viona Gilmore, [facility] (Pharmacist) on 09/17/20 at 1332  Med List Status: <None>  Medication Order Taking? Sig Documenting Provider Last Dose Status Informant  acetaminophen (TYLENOL) 500 MG tablet [id number] Yes Take 500 mg by mouth every 6 (six) hours as needed. [provider] Taking Active   calcium carbonate (OS-CAL - DOSED IN MG OF ELEMENTAL CALCIUM) 1250 (500 Ca) MG tablet  [id number] Yes Take 1 tablet by mouth daily.  [provider] Taking Active Spouse/Significant Other  denosumab (PROLIA) 60 MG/ML SOSY injection [id number] Yes Inject 60 mg into the skin every 6 (six) months. [provider] Taking Active Spouse/Significant Other           Med Note Jeanie Cooks Feb 15, 2020 12:27 PM) Last ELFY:04/1750  folic acid (FOLVITE) 1 MG tablet [id number] Yes TAKE 1 TABLET BY MOUTH EVERY DAY Isaac Bliss, Rayford Halsted, MD Taking Active   gabapentin (NEURONTIN) 100 MG capsule [id number] Yes TAKE 1 CAPSULE BY MOUTH TWICE A DAY Isaac Bliss, Rayford Halsted, MD Taking Active   Multiple Vitamin (MULTIVITAMIN WITH MINERALS) TABS tablet [id number] Yes Take 1 tablet by mouth daily. Centrum Silver [provider] Taking Active Spouse/Significant Other  pantoprazole (PROTONIX) 40 MG tablet [id number] Yes TAKE 1 TABLET BY MOUTH EVERY DAY Isaac Bliss, Rayford Halsted, MD Taking Active   thiamine 100 MG tablet [id number] Yes Take 1 tablet (100 mg total) by mouth daily.  Patient taking differently: Take 100 mg by mouth every morning.   Isaac Bliss, Rayford Halsted, MD Taking Active Spouse/Significant Other          Patient Active Problem List   Diagnosis Date Noted  . Diverticular disease 03/09/2020  . Abscess of sigmoid colon due to diverticulitis 11/23/2019  . Long QT interval 11/23/2019  . Vitamin D deficiency 06/03/2019  . Dementia ([place]) 04/05/2019  . Malnutrition of moderate degree 08/23/2017  . Hyponatremia 08/20/2017  . Anemia 08/20/2017  . LUQ abdominal pain near former jejunostomy site 03/04/2011  . 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 SHINGLES 08/19/2010  . B12 deficiency 10/02/2009  . Iron deficiency anemia 07/09/2007  . DIVERTICULOSIS 07/09/2007  . Essential hypertension 06/23/2007  . 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 INSOMNIA 05/31/2007  . HEADACHE 05/31/2007  . Anxiety state 01/21/2007   . Alcoholism ([place]) 01/21/2007  . Osteoporosis 01/21/2007    Immunization History  Administered Date(s) Administered  . Fluad Quad(high Dose 65+) 04/11/2019, 04/10/2020  . Influenza Split 04/08/2011, 04/15/2012  . Influenza Whole 05/20/2007, 04/20/2008, 03/26/2010  . Influenza,inj,Quad PF,6+ Mos 04/11/2014, 08/31/2015, 05/16/2016  . PFIZER(Purple Top)SARS-COV-2 Vaccination 09/03/2019,  09/25/2019, 05/19/2020  . Pneumococcal Conjugate-13 04/10/2020  . Pneumococcal Polysaccharide-23 06/03/2019  . Tdap 02/01/2014    Conditions to be addressed/monitored:  Hypertension, Osteoporosis and diverticulosis and pain and anxiety  Care Plan : CCM Phamracy Care Plan  Updates made by Viona Gilmore, [place] since 09/17/2020 12:00 AM    Problem: Problem: Hypertension, Osteoporosis and diverticulosis and pain and anxiety     Long-Range Goal: Patient-Specific Goal   Start Date: 09/05/2020  Expected End Date: 09/05/2021  This Visit's Progress: On track  Priority: High  Note:   Current Barriers:  . Unable to independently monitor therapeutic efficacy  Pharmacist Clinical Goal(s):  Marland Kitchen Over the next 180 days, patient will achieve adherence to monitoring guidelines and medication adherence to achieve therapeutic efficacy through collaboration with PharmD and provider.    Interventions: . 1:1 collaboration with Isaac Bliss, Rayford Halsted, MD regarding development and update of comprehensive plan of care as evidenced by provider attestation and co-signature . Inter-disciplinary care team collaboration (see longitudinal plan of care) . Comprehensive medication review performed; medication list updated in electronic medical record  Hypertension (BP goal <140/90) -Controlled -Current treatment: . No medications -Medications previously tried: amlodipine (orthostatic hypotension), lisinopril  -Current home readings: 120/70s -Current dietary habits: patient does not like salt in her foods (except with  french fries), has had a history of hyponatremia -Current exercise habits: patient tries to walk daily -Denies hypotensive/hypertensive symptoms -Educated on Importance of home blood pressure monitoring; -Counseled to monitor BP at home weekly, document, and provide log at future appointments -Counseled on diet and exercise extensively  Anxiety (Goal: minimize symptoms of anxiety) -Controlled -Current treatment: . Gabapentin 100 mg 1 tablet twice daily -Medications previously tried/failed: none -GAD7: n/a -Educated on Benefits of medication for symptom control -Recommended to continue current medication  Osteoporosis (Goal improve bone density and prevent fractures) -Controlled -Last DEXA Scan: 06/07/2019  T-Score femoral neck: -3.3  T-Score total hip: n/a  T-Score lumbar spine: -4.7  T-Score forearm radius: n/a  10-year probability of major osteoporotic fracture: n/a  10-year probability of hip fracture: n/a -Patient is a candidate for pharmacologic treatment due to T-Score < -2.5 in femoral neck and T-Score < -2.5 in lumbar spine -Current treatment  . Prolia injection once every 6 months . Calcium with vitamin D 400 mg-500 units of vitamin - 1 tablet daily . Multivitamin 1 tablet daily (300 mg, 1000 units of vitamin D) -Medications previously tried: none  -Recommend [phone] units of vitamin D daily. Recommend 1200 mg of calcium daily from dietary and supplemental sources. Recommend weight-bearing and muscle strengthening exercises for building and maintaining bone density. -Recommended to continue current medication  Pain (Goal: minimize symptoms of pain) -Controlled -Current treatment  . Acetaminophen 500 mg 1 tablet every 6 hours as needed  (taking once daily) -Medications previously tried: none  -Recommended to continue current medication Counseled on maximum dose of 3,000 mg per day of Tylenol  Diverticulosis (Goal: minimize symptoms) -Controlled -Current treatment   . Pantoprazole 40 mg 1 tablet daily -Medications previously tried: none  -Recommended to continue current medication Counseled on avoiding greasy foods, foods full of fiber, and other non-pharmacologic  non-pharmacologic management of symptoms such as elevating the head of your bed, avoiding eating 2-3 hours before bed, avoiding triggering foods such as acidic, spicy, or fatty foods, eating smaller meals, and wearing clothes that are loose around the waist    Health Maintenance -Vaccine gaps: shingles -Current therapy:  . Multivitamin 1 tablet daily (300  mg, 1000 units of vitamin D) . Thiamine 100 mg 1 tablet daily . Folic acid 1 mg 1 tablet daily  -Educated on Cost vs benefit of each product must be carefully weighed by individual consumer -Patient is satisfied with current therapy and denies issues -Recommended to continue current medication  Patient Goals/Self-Care Activities . Over the next 180 days, patient will:  - take medications as prescribed check blood pressure weekly, document, and provide at future appointments  Follow Up Plan: Telephone follow up appointment with care management team member scheduled for: 6 months       Medication Assistance: None required.  Patient affirms current coverage meets needs.  Patient's preferred pharmacy is:  [phone] [place], [place] - [place] [address] [place] [address] Phone: [phone] Fax: [phone]  [id number] [address] [place] [address] Phone: [phone] Fax: [phone]  Uses pill box? No - gives them all once a day except gabapentin Pt endorses 99% compliance -  Hardly ever  We discussed: Benefits of medication synchronization, packaging and delivery as well as enhanced pharmacist oversight with [employer]. Patient decided to: Continue current medication management strategy  Care Plan and Follow Up Patient  Decision:  Patient agrees to Care Plan and Follow-up.  Plan: Telephone follow up appointment with care management team member scheduled for:  6 months  Jeni Salles, PharmD [place] Pharmacist [place] [facility] [place] [phone]

## 2020-09-17 NOTE — Patient Instructions (Addendum)
Hi [name] and [name]!  It was great to get to meet you both over the phone. Below is a summary of some of the topics we discussed. Continue trying to get in plenty of walking and other exercise to both help your blood pressure and strengthen your bones. Also, don't forget to look into getting your shingles vaccine to stay up to date and protected from the shingles virus.  Please give me a call if you have any questions or need anything from me before your follow up in 6 months!  Best, Maddie  Gaylord Shih, PharmD [facility] Clinical Pharmacist [facility] [phone]   Visit Information  Goals Addressed   None    Patient Care Plan: CCM Phamracy Care Plan    Problem Identified: Problem: Hypertension, Osteoporosis and diverticulosis and pain and anxiety     Long-Range Goal: Patient-Specific Goal   Start Date: 09/05/2020  Expected End Date: 09/05/2021  This Visit's Progress: On track  Priority: High  Note:   Current Barriers:  . Unable to independently monitor therapeutic efficacy  Pharmacist Clinical Goal(s):  Marland Kitchen Over the next 180 days, patient will achieve adherence to monitoring guidelines and medication adherence to achieve therapeutic efficacy through collaboration with PharmD and provider.    Interventions: . 1:1 collaboration with Philip Aspen, Limmie Patricia, MD regarding development and update of comprehensive plan of care as evidenced by provider attestation and co-signature . Inter-disciplinary care team collaboration (see longitudinal plan of care) . Comprehensive medication review performed; medication list updated in electronic medical record  Hypertension (BP goal <140/90) -Controlled -Current treatment: . No medications -Medications previously tried: amlodipine (orthostatic hypotension), lisinopril  -Current home readings: 120/70s -Current dietary habits: patient does not like salt in her foods (except with french fries), has had a history  of hyponatremia -Current exercise habits: patient tries to walk daily -Denies hypotensive/hypertensive symptoms -Educated on Importance of home blood pressure monitoring; -Counseled to monitor BP at home weekly, document, and provide log at future appointments -Counseled on diet and exercise extensively  Anxiety (Goal: minimize symptoms of anxiety) -Controlled -Current treatment: . Gabapentin 100 mg 1 tablet twice daily -Medications previously tried/failed: none -GAD7: n/a -Educated on Benefits of medication for symptom control -Recommended to continue current medication  Osteoporosis (Goal improve bone density and prevent fractures) -Controlled -Last DEXA Scan: 06/07/2019  T-Score femoral neck: -3.3  T-Score total hip: n/a  T-Score lumbar spine: -4.7  T-Score forearm radius: n/a  10-year probability of major osteoporotic fracture: n/a  10-year probability of hip fracture: n/a -Patient is a candidate for pharmacologic treatment due to T-Score < -2.5 in femoral neck and T-Score < -2.5 in lumbar spine -Current treatment  . Prolia injection once every 6 months . Calcium with vitamin D 400 mg-500 units of vitamin - 1 tablet daily . Multivitamin 1 tablet daily (300 mg, 1000 units of vitamin D) -Medications previously tried: none  -Recommend [phone] units of vitamin D daily. Recommend 1200 mg of calcium daily from dietary and supplemental sources. Recommend weight-bearing and muscle strengthening exercises for building and maintaining bone density. -Recommended to continue current medication  Pain (Goal: minimize symptoms of pain) -Controlled -Current treatment  . Acetaminophen 500 mg 1 tablet every 6 hours as needed  (taking once daily) -Medications previously tried: none  -Recommended to continue current medication Counseled on maximum dose of 3,000 mg per day of Tylenol  Diverticulosis (Goal: minimize symptoms) -Controlled -Current treatment  . Pantoprazole 40 mg 1 tablet  daily -Medications previously tried:  none  -Recommended to continue current medication Counseled on avoiding greasy foods, foods full of fiber, and other non-pharmacologic  non-pharmacologic management of symptoms such as elevating the head of your bed, avoiding eating 2-3 hours before bed, avoiding triggering foods such as acidic, spicy, or fatty foods, eating smaller meals, and wearing clothes that are loose around the waist    Health Maintenance -Vaccine gaps: shingles -Current therapy:  . Multivitamin 1 tablet daily (300 mg, 1000 units of vitamin D) . Thiamine 100 mg 1 tablet daily . Folic acid 1 mg 1 tablet daily  -Educated on Cost vs benefit of each product must be carefully weighed by individual consumer -Patient is satisfied with current therapy and denies issues -Recommended to continue current medication  Patient Goals/Self-Care Activities . Over the next 180 days, patient will:  - take medications as prescribed check blood pressure weekly, document, and provide at future appointments  Follow Up Plan: Telephone follow up appointment with care management team member scheduled for: 6 months      [name] was given information about Chronic Care Management services today including:  1. CCM service includes personalized support from designated clinical staff supervised by her physician, including individualized plan of care and coordination with other care providers 2. 24/7 contact phone numbers for assistance for urgent and routine care needs. 3. Standard insurance, coinsurance, copays and deductibles apply for chronic care management only during months in which we provide at least 20 minutes of these services. Rivera insurances cover these services at 100%, however patients may be responsible for any copay, coinsurance and/or deductible if applicable. This service may help you avoid the need for more expensive face-to-face services. 4. Only one practitioner may furnish and bill  the service in a calendar month. 5. The patient may stop CCM services at any time (effective at the end of the month) by phone call to the office staff.  Patient agreed to services and verbal consent obtained.   The patient verbalized understanding of instructions, educational materials, and care plan provided today and agreed to receive a mailed copy of patient instructions, educational materials, and care plan.  Telephone follow up appointment with pharmacy team member scheduled for: 6 months  Verner Chol, [facility]  Eating Plan for Osteoporosis Osteoporosis causes your bones to become weak and brittle. This puts you at greater risk for bone breaks (fractures) from small bumps or falls. Making changes to your diet and increasing your physical activity can help strengthen your bones and improve your overall health. Calcium and vitamin D are nutrients that play an important role in bone health. Vitamin D helps your body use calcium and strengthen bones. It is important to get enough calcium and vitamin D as part of your eating plan for osteoporosis. What are tips for following this plan? Reading food labels  Try to get at least 1,000 milligrams (mg) of calcium each day.  Look for foods that have at least 50 mg of calcium per serving.  Talk with your health care provider about taking a calcium supplement if you do not get enough calcium from food.  Do not have more than 2,500 mg of calcium each day. This is the upper limit for food and nutritional supplements combined. Too much calcium may cause constipation and prevent you from absorbing other important nutrients.  Choose foods that contain vitamin D.  Take a daily vitamin supplement that contains 800-1,000 international units (IU) of vitamin D. The amount may be different depending on your  age, body weight, and where you live. Talk with your dietitian or health care provider about how much vitamin D is right for you.  Avoid foods that  have more than 300 mg of sodium per serving. Too much sodium can cause your body to lose calcium.  Talk with your dietitian or health care provider about how much sodium you are allowed each day. Shopping  Do not buy foods with added salt, including: ? Salted snacks. ? Rosita Fire. ? Canned soups. ? Canned meats. ? Processed meats, such as bacon or precooked or cured meat like sausages or meat loaves. ? Smoked fish. Meal planning  Eat balanced meals that contain protein foods, fruits and vegetables, and foods rich in calcium and vitamin D.  Eat at least 5 servings of fruits and vegetables each day.  Eat 5-6 oz (142-170 g) of lean meat, poultry, fish, eggs, or beans each day. Lifestyle  Do not use any products that contain nicotine or tobacco, such as cigarettes, e-cigarettes, and chewing tobacco. If you need help quitting, ask your health care provider.  If your health care provider recommends that you lose weight: ? Work with a dietitian to develop an eating plan that will help you reach your desired weight goal. ? Exercise for at least 30 minutes a day, 5 or more days a week, or as told by your health care provider.  Work with a physical therapist to develop an exercise plan that includes flexibility, balance, and strength exercises. Do not focus only on aerobic exercise.  Do not drink alcohol if: ? Your health care provider tells you not to drink. ? You are pregnant, may be pregnant, or are planning to become pregnant.  If you drink alcohol: ? Limit how much you use to:  0-1 drink a day for women.  0-2 drinks a day for men. ? Be aware of how much alcohol is in your drink. In the [place] one drink equals one 12 oz bottle of beer (355 mL), one 5 oz glass of wine (148 mL), or one 1 oz glass of hard liquor (44 mL). What foods should I eat? Foods high in calcium  Yogurt. Yogurt with fruit.  Milk. Evaporated skim milk. Dry milk powder.  Calcium-fortified orange  juice.  Parmesan cheese. Part-skim ricotta cheese. Natural hard cheese. Cream cheese. Cottage cheese.  Canned sardines. Canned salmon.  Calcium-treated tofu. Calcium-fortified cereal bar. Calcium-fortified cereal. Calcium-fortified graham crackers.  Cooked collard greens. Turnip greens. Broccoli. Kale.  Almonds.  White beans.  Corn tortilla.   Foods high in vitamin D  Cod liver oil. Fatty fish, such as tuna, mackerel, and salmon.  Milk. Fortified soy milk. Fortified fruit juice.  Yogurt. Margarine.  Egg yolks. Foods high in protein  Beef. Lamb. Pork tenderloin.  Chicken breast.  Tuna (canned). Fish fillet.  Tofu.  Cooked soy beans. Soy patty. Beans (canned or cooked).  Cottage cheese.  Yogurt.  Peanut butter.  Pumpkin seeds. Nuts. Sunflower seeds.  Hard cheese.  Milk or other milk products, such as soy milk. The items listed above may not be a complete list of foods and beverages you can eat. Contact a dietitian for more options. Summary  Calcium and vitamin D are nutrients that play an important role in bone health and are an important part of your eating plan for osteoporosis.  Eat balanced meals that contain protein foods, fruits and vegetables, and foods rich in calcium and vitamin D.  Avoid foods that have more than 300 mg of  sodium per serving. Too much sodium can cause your body to lose calcium.  Exercise is an important part of prevention and treatment of osteoporosis. Aim for at least 30 minutes a day, 5 days a week. This information is not intended to replace advice given to you by your health care provider. Make sure you discuss any questions you have with your health care provider. Document Revised: 12/22/2019 Document Reviewed: 12/22/2019 Elsevier Patient Education  2021 [employer]

## 2020-09-19 ENCOUNTER — Telehealth: Payer: Self-pay | Admitting: *Deleted

## 2020-09-19 DIAGNOSIS — F1020 Alcohol dependence, uncomplicated: Secondary | ICD-10-CM

## 2020-09-19 MED ORDER — THIAMINE HCL 100 MG PO TABS: 100.0000 mg | ORAL_TABLET | Freq: Every day | ORAL | 2 refills | Status: AC

## 2020-09-19 NOTE — Telephone Encounter (Signed)
-----   Message from Henderson Cloud, MD sent at 09/18/2020  6:54 AM EST ----- Regarding: FW: Thiamine and folic acid clarification Do you mind sending that thiamine in?  EH ----- Message ----- From: Verner Chol, [facility] Sent: 09/17/2020   1:36 PM EST To: Henderson Cloud, MD Subject: Thiamine and folic acid clarification          Hi,  When I spoke with [name] and her husband about her medications they wanted some clarification for the thiamine and folic acid supplementation. They wanted to know if [name] is supposed to continue taking these indefinitely or was she only to be taking for a period of time? If so, can you please send a new prescription of the thiamine to [facility] on [address]?  Let me know and I will reach back out to the patient and her husband!  Thank you, Maddie

## 2020-09-19 NOTE — Telephone Encounter (Signed)
Refill sent.

## 2020-10-14 ENCOUNTER — Other Ambulatory Visit: Payer: Self-pay | Admitting: Internal Medicine

## 2020-10-14 DIAGNOSIS — K5720 Diverticulitis of large intestine with perforation and abscess without bleeding: Secondary | ICD-10-CM

## 2020-10-26 ENCOUNTER — Other Ambulatory Visit: Payer: Self-pay | Admitting: Internal Medicine

## 2020-10-26 DIAGNOSIS — K5720 Diverticulitis of large intestine with perforation and abscess without bleeding: Secondary | ICD-10-CM

## 2020-11-07 ENCOUNTER — Telehealth: Payer: Self-pay | Admitting: Pharmacist

## 2020-11-07 NOTE — Progress Notes (Signed)
    Chronic Care Management Pharmacy Assistant   Name: [name]  MRN: [id number] DOB: 12/23/1950  Reason for Encounter: Medication Review/Medication Coordination Call.   Conditions to be addressed/monitored: HTN, Anxiety and Dementia, Insomnia  Recent office visits:  None since 09/05/20  Recent consult visits:  None since 09/05/20  Hospital visits:  None in previous 6 months  Medications: Outpatient Encounter Medications as of 11/07/2020  Medication Sig Note  . acetaminophen (TYLENOL) 500 MG tablet Take 500 mg by mouth every 6 (six) hours as needed.   . calcium carbonate (OS-CAL - DOSED IN MG OF ELEMENTAL CALCIUM) 1250 (500 Ca) MG tablet Take 1 tablet by mouth daily.    Marland Kitchen denosumab (PROLIA) 60 MG/ML SOSY injection Inject 60 mg into the skin every 6 (six) months. 02/15/2020: Last dose:10/2019  . folic acid (FOLVITE) 1 MG tablet TAKE 1 TABLET BY MOUTH EVERY DAY   . gabapentin (NEURONTIN) 100 MG capsule TAKE 1 CAPSULE BY MOUTH TWICE A DAY   . Multiple Vitamin (MULTIVITAMIN WITH MINERALS) TABS tablet Take 1 tablet by mouth daily. Centrum Silver   . pantoprazole (PROTONIX) 40 MG tablet TAKE 1 TABLET BY MOUTH EVERY DAY   . thiamine 100 MG tablet Take 1 tablet (100 mg total) by mouth daily.    No facility-administered encounter medications on file as of 11/07/2020.    GEN CALL: Patient stated she is pretty active she cleans up around the house daily. Patient stated she usually bakes her meats. She stated she eats vegetables and fruits on a regularly. She stated she drinks a good amount of water daily. Patient stated she does not have any questions or concerns about her medication.  Star Rating Drugs: None  Follow-Up: Pharmacist Review  15 min  Hulen Luster, [place] Clinical Pharmacist Assistant [phone]

## 2020-11-15 ENCOUNTER — Telehealth: Payer: Self-pay | Admitting: Internal Medicine

## 2020-11-15 NOTE — Telephone Encounter (Signed)
Patient wants to know if Dr. Ardyth Harps recommends her to get the 2nd booster shot.

## 2020-11-15 NOTE — Telephone Encounter (Signed)
Advised patient to get a covid vaccine booster

## 2020-11-19 DIAGNOSIS — F1021 Alcohol dependence, in remission: Secondary | ICD-10-CM | POA: Diagnosis not present

## 2020-11-19 DIAGNOSIS — K219 Gastro-esophageal reflux disease without esophagitis: Secondary | ICD-10-CM | POA: Diagnosis not present

## 2020-11-19 DIAGNOSIS — I10 Essential (primary) hypertension: Secondary | ICD-10-CM | POA: Diagnosis not present

## 2020-12-21 ENCOUNTER — Telehealth: Payer: Self-pay

## 2020-12-21 NOTE — Telephone Encounter (Signed)
Patient is due for her Prolia. Verification process started.

## 2021-01-09 NOTE — Telephone Encounter (Signed)
I spoke with patient's husband. They will apply for the grant again and let me know if they want to move forward after hearing from the grant company.

## 2021-01-28 NOTE — Telephone Encounter (Signed)
Patient's husband called back. The grant does not have any funds at this time - but advised him to continue to check back with them. He wanted to go ahead and schedule her Prolia injection, injection scheduled for Thursday at 2:30.

## 2021-01-30 ENCOUNTER — Other Ambulatory Visit: Payer: Self-pay

## 2021-01-31 ENCOUNTER — Ambulatory Visit (INDEPENDENT_AMBULATORY_CARE_PROVIDER_SITE_OTHER): Payer: PPO | Admitting: *Deleted

## 2021-01-31 DIAGNOSIS — M810 Age-related osteoporosis without current pathological fracture: Secondary | ICD-10-CM | POA: Diagnosis not present

## 2021-01-31 MED ORDER — DENOSUMAB 60 MG/ML ~~LOC~~ SOSY
60.0000 mg | PREFILLED_SYRINGE | Freq: Once | SUBCUTANEOUS | Status: AC
  Administered 2021-01-31: 60 mg via SUBCUTANEOUS

## 2021-01-31 NOTE — Progress Notes (Signed)
Per orders of Dr. Hernandez, injection of Prolia given by Shaine Mount. Patient tolerated injection well. 

## 2021-03-06 ENCOUNTER — Telehealth: Payer: Self-pay | Admitting: Pharmacist

## 2021-03-06 NOTE — Chronic Care Management (AMB) (Signed)
    Chronic Care Management Pharmacy Assistant   Name: [name]  MRN: [id number] DOB: May 21, 1951  03/06/21-  Called patient to remind of appointment with Gaylord Shih) on (03/07/21 at 1pm via telephone.)   No answer, left message of appointment date, time and type of appointment (either telephone or in person). Left message to have all medications, supplements, blood pressure and/or blood sugar logs available during appointment and to return call if need to reschedule.  Care Gaps:  AWV - message sent to Carrie Mew CMA to schedule  Hepatitis C screening - never done Zoster vaccines - never done Covid -19 vaccine booster 4 - overdue since 09/17/20 Flu vaccine - due  Star Rating Drug:  None.  Any gaps in medications fill history? N/A  Joycelyn Das CMA  Clinical Pharmacist Assistant [phone]

## 2021-03-07 ENCOUNTER — Ambulatory Visit (INDEPENDENT_AMBULATORY_CARE_PROVIDER_SITE_OTHER): Payer: PPO | Admitting: Pharmacist

## 2021-03-07 DIAGNOSIS — M810 Age-related osteoporosis without current pathological fracture: Secondary | ICD-10-CM

## 2021-03-07 DIAGNOSIS — I10 Essential (primary) hypertension: Secondary | ICD-10-CM

## 2021-03-07 NOTE — Progress Notes (Signed)
Chronic Care Management Pharmacy Note  03/07/2021 Name:  [name] MRN:  [id number] DOB:  01/03/1951  Summary: BP was elevated today and pt's husband was concerned   Recommendations/Changes made from today's visit: -Discussed proper BP monitoring technique and continued monitoring at home at least once a week -Consider low dose lisinopril as patient did not have side effects if BP remains elevated   Plan: BP assessment in 1 month PCP annual visit scheduled  Subjective: [name] is an 70 y.o. year old female who is a primary patient of Isaac Bliss, Rayford Halsted, MD.  The CCM team was consulted for assistance with disease management and care coordination needs.    Engaged with patient by telephone for follow up visit in response to provider referral for pharmacy case management and/or care coordination services.   Consent to Services:  The patient was given information about Chronic Care Management services, agreed to services, and gave verbal consent prior to initiation of services.  Please see initial visit note for detailed documentation.   Patient Care Team: Isaac Bliss, Rayford Halsted, MD as PCP - General (Internal Medicine) Gatha Mayer, MD as Consulting Physician (Gastroenterology) Viona Gilmore, [facility] as Pharmacist (Pharmacist)  Recent office visits: 01/31/21 Westley Hummer, CMA: Patient presented for Prolia injection.   04/10/20 [name], MD: Patient presented for annual exam. Prevnar and influenza vaccines administered.   Recent consult visits: 12/22/19 Patient presented for post hospital follow up after diverticulitis and abscess.  Hospital visits: 03/09/20 Patient admitted for sigmoid colectomy with proctoscopy.  01/26/20 Patient admitted for colonoscopy.  Objective:  Lab Results  Component Value Date   CREATININE 0.58 04/10/2020   BUN 17 04/10/2020   GFR 125.67 12/13/2019   GFRNONAA >60 03/12/2020   GFRAA >60 03/12/2020   NA  130 (L) 04/10/2020   K 3.9 04/10/2020   CALCIUM 9.4 04/10/2020   CO2 28 04/10/2020    Lab Results  Component Value Date/Time   HGBA1C 5.4 04/10/2020 08:18 AM   HGBA1C 5.6 06/03/2019 07:49 AM   GFR 125.67 12/13/2019 01:59 PM   GFR 103.61 06/03/2019 07:49 AM    Last diabetic Eye exam: No results found for: HMDIABEYEEXA  Last diabetic Foot exam: No results found for: HMDIABFOOTEX   Lab Results  Component Value Date   CHOL 206 (H) 04/10/2020   HDL 64 04/10/2020   LDLCALC 128 (H) 04/10/2020   TRIG 57 04/10/2020   CHOLHDL 3.2 04/10/2020    Hepatic Function Latest Ref Rng & Units 04/10/2020 11/26/2019 11/25/2019  Total Protein 6.1 - 8.1 g/dL 6.9 5.2(L) 5.8(L)  Albumin 3.5 - 5.0 g/dL - 2.5(L) 2.9(L)  AST 10 - 35 U/L $Remo'16 18 23  'gjKIy$ ALT 6 - 29 U/L $Remo'11 13 18  'DGOBF$ Alk Phosphatase 38 - 126 U/L - 68 89  Total Bilirubin 0.2 - 1.2 mg/dL 0.6 0.4 1.3(H)  Bilirubin, Direct 0.0 - 0.2 mg/dL - - -    Lab Results  Component Value Date/Time   TSH 1.75 04/10/2020 08:18 AM   TSH 1.85 06/03/2019 07:49 AM    CBC Latest Ref Rng & Units 04/10/2020 03/12/2020 03/11/2020  WBC 3.8 - 10.8 Thousand/uL 7.2 10.5 11.6(H)  Hemoglobin 11.7 - 15.5 g/dL 13.5 11.7(L) 11.1(L)  Hematocrit 35.0 - 45.0 % 39.6 33.9(L) 33.1(L)  Platelets 140 - 400 Thousand/uL 260 282 246    Lab Results  Component Value Date/Time   VD25OH 38 04/10/2020 08:18 AM   VD25OH 31.43 12/13/2019 01:59 PM  VD25OH 22.43 (L) 06/03/2019 07:49 AM    Clinical ASCVD: No  The 10-year ASCVD risk score Mikey Bussing DC Jr., et al., 2013) is: 8.6%   Values used to calculate the score:     Age: 89 years     Sex: Female     Is Non-Hispanic African American: No     Diabetic: No     Tobacco smoker: No     Systolic Blood Pressure: 017 mmHg     Is BP treated: No     HDL Cholesterol: 64 mg/dL     Total Cholesterol: 206 mg/dL    Depression screen [facility] 2/9 04/10/2020 06/03/2019 04/11/2014  Decreased Interest 0 0 0  Down, Depressed, Hopeless 0 0 0  PHQ - 2 Score 0 0 0   Altered sleeping 0 0 -  Tired, decreased energy 0 0 -  Change in appetite 0 0 -  Feeling bad or failure about yourself  0 0 -  Trouble concentrating 0 0 -  Moving slowly or fidgety/restless 0 0 -  Suicidal thoughts 0 0 -  PHQ-9 Score 0 0 -  Difficult doing work/chores Not difficult at all Not difficult at all -  Some recent data might be hidden      Social History   Tobacco Use  Smoking Status Former   Types: Cigarettes   Quit date: 07/21/2014   Years since quitting: 6.6  Smokeless Tobacco Never   BP Readings from Last 3 Encounters:  04/10/20 130/80  03/14/20 135/73  02/29/20 (!) 145/69   Pulse Readings from Last 3 Encounters:  04/10/20 62  03/14/20 64  02/29/20 (!) 53   Wt Readings from Last 3 Encounters:  04/10/20 106 lb 3.2 oz (48.2 kg)  03/13/20 108 lb 0.4 oz (49 kg)  02/29/20 112 lb (50.8 kg)    Assessment/Interventions: Review of patient past medical history, allergies, medications, health status, including review of consultants reports, laboratory and other test data, was performed as part of comprehensive evaluation and provision of chronic care management services.   SDOH:  (Social Determinants of Health) assessments and interventions performed: No   CCM Care Plan  Allergies  Allergen Reactions   Sulfa Antibiotics Anaphylaxis and Rash   Bee Venom Swelling   Dilaudid [Hydromorphone Hcl] Hives   Penicillins Other (See Comments)    Has patient had a PCN reaction causing immediate rash, facial/tongue/throat swelling, SOB or lightheadedness with hypotension: No Has patient had a PCN reaction causing severe rash involving mucus membranes or skin necrosis: No Has patient had a PCN reaction that required hospitalization No Has patient had a PCN reaction occurring within the last 10 years: No If all of the above answers are "NO", then may proceed with Cephalosporin use.    Medications Reviewed Today     Reviewed by Viona Gilmore, [facility] (Pharmacist) on  09/17/20 at 1332  Med List Status: <None>   Medication Order Taking? Sig Documenting Provider Last Dose Status Informant  acetaminophen (TYLENOL) 500 MG tablet [id number] Yes Take 500 mg by mouth every 6 (six) hours as needed. [provider] Taking Active   calcium carbonate (OS-CAL - DOSED IN MG OF ELEMENTAL CALCIUM) 1250 (500 Ca) MG tablet [id number] Yes Take 1 tablet by mouth daily.  [provider] Taking Active Spouse/Significant Other  denosumab (PROLIA) 60 MG/ML SOSY injection [id number] Yes Inject 60 mg into the skin every 6 (six) months. [provider] Taking Active Spouse/Significant Other  Med Note Jeanie Cooks Feb 15, 2020 12:27 PM) Last CBSW:96/7591  folic acid (FOLVITE) 1 MG tablet [id number] Yes TAKE 1 TABLET BY MOUTH EVERY DAY Isaac Bliss, Rayford Halsted, MD Taking Active   gabapentin (NEURONTIN) 100 MG capsule [id number] Yes TAKE 1 CAPSULE BY MOUTH TWICE A DAY Isaac Bliss, Rayford Halsted, MD Taking Active   Multiple Vitamin (MULTIVITAMIN WITH MINERALS) TABS tablet [id number] Yes Take 1 tablet by mouth daily. Centrum Silver [provider] Taking Active Spouse/Significant Other  pantoprazole (PROTONIX) 40 MG tablet [id number] Yes TAKE 1 TABLET BY MOUTH EVERY DAY Isaac Bliss, Rayford Halsted, MD Taking Active   thiamine 100 MG tablet [id number] Yes Take 1 tablet (100 mg total) by mouth daily.  Patient taking differently: Take 100 mg by mouth every morning.   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   SHINGLES 08/19/2010   B12 deficiency 10/02/2009   Iron deficiency anemia 07/09/2007   DIVERTICULOSIS 07/09/2007   Essential hypertension 06/23/2007   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   INSOMNIA 05/31/2007   HEADACHE 05/31/2007   Anxiety state 01/21/2007   Alcoholism ([place]) 01/21/2007   Osteoporosis 01/21/2007    Immunization History  Administered Date(s) Administered   Fluad Quad(high Dose 65+) 04/11/2019, 04/10/2020   Influenza Split 04/08/2011, 04/15/2012   Influenza Whole 05/20/2007, 04/20/2008, 03/26/2010   Influenza,inj,Quad PF,6+ Mos 04/11/2014, 08/31/2015, 05/16/2016   PFIZER(Purple Top)SARS-COV-2 Vaccination 09/03/2019, 09/25/2019, 05/19/2020, 11/28/2020   Pneumococcal Conjugate-13 04/10/2020   Pneumococcal Polysaccharide-23 06/03/2019   Tdap 02/01/2014   Patient didn't sleep well last night and she usually sleeps 8-9 hours per night. She had 3-4 hours of sleep last night. Her blood pressure this morning was 150/82 and it is usually in the 130/75 range. She denied any changes in diet or activity recently. Patient has headaches every once in a while and goes for a walk almost every day. For dinner yesterday, she ate a pre-seasoned chicken breast and a baked potato and this meal is usually the largest for her. She often doesn't eat much the rest of the day.  Conditions to be addressed/monitored:  Hypertension, Osteoporosis and diverticulosis and  pain and anxiety  Conditions addressed this visit: Hypertension, osteoporosis  Care Plan : CCM Phamracy Care Plan  Updates made by Viona Gilmore, [place] since 03/07/2021 12:00 AM     Problem: Problem: Hypertension, Osteoporosis and diverticulosis and pain and anxiety      Long-Range Goal: Patient-Specific Goal   Start Date: 09/05/2020  Expected End Date: 09/05/2021  Recent Progress: On track  Priority: High  Note:   Current Barriers:  Unable to  independently monitor therapeutic efficacy  Pharmacist Clinical Goal(s):  Patient will achieve adherence to monitoring guidelines and medication adherence to achieve therapeutic efficacy through collaboration with PharmD and provider.    Interventions: 1:1 collaboration with Isaac Bliss, Rayford Halsted, MD regarding development and update of comprehensive plan of care as evidenced by provider attestation and co-signature Inter-disciplinary care team  collaboration (see longitudinal plan of care) Comprehensive medication review performed; medication list updated in electronic medical record  Hypertension (BP goal <140/90) -Controlled -Current treatment: No medications -Medications previously tried: amlodipine (orthostatic hypotension), lisinopril  -Current home readings: 120/75 usually; 150/82 this morning -Current dietary habits: patient does not like salt in her foods (except with french fries), has had a history of hyponatremia -Current exercise habits: patient tries to walk daily -Denies hypotensive/hypertensive symptoms -Educated on Daily salt intake goal < 2300 mg; Importance of home blood pressure monitoring; Proper BP monitoring technique; -Counseled to monitor BP at home weekly, document, and provide log at future appointments -Counseled on diet and exercise extensively  Anxiety (Goal: minimize symptoms of anxiety) -Controlled -Current treatment: Gabapentin 100 mg 1 tablet twice daily -Medications previously tried/failed: none -GAD7: n/a -Educated on Benefits of medication for symptom control -Recommended to continue current medication  Osteoporosis (Goal improve bone density and prevent fractures) -Controlled -Last DEXA Scan: 06/07/2019  T-Score femoral neck: -3.3  T-Score total hip: n/a  T-Score lumbar spine: -4.7  T-Score forearm radius: n/a  10-year probability of major osteoporotic fracture: n/a  10-year probability of hip fracture: n/a -Patient is a candidate  for pharmacologic treatment due to T-Score < -2.5 in femoral neck and T-Score < -2.5 in lumbar spine -Current treatment  Prolia injection once every 6 months Calcium with vitamin D 400 mg-500 units of vitamin - 1 tablet daily Multivitamin 1 tablet daily (300 mg, 1000 units of vitamin D) -Medications previously tried: none  -Recommend [phone] units of vitamin D daily. Recommend 1200 mg of calcium daily from dietary and supplemental sources. Recommend weight-bearing and muscle strengthening exercises for building and maintaining bone density. -Recommended to continue current medication  Pain (Goal: minimize symptoms of pain) -Controlled -Current treatment  Acetaminophen 500 mg 1 tablet every 6 hours as needed  (taking once daily) -Medications previously tried: none  -Recommended to continue current medication Counseled on maximum dose of 3,000 mg per day of Tylenol  Diverticulosis (Goal: minimize symptoms) -Controlled -Current treatment  Pantoprazole 40 mg 1 tablet daily -Medications previously tried: none  -Recommended to continue current medication Counseled on avoiding greasy foods, foods full of fiber, and other non-pharmacologic  non-pharmacologic management of symptoms such as elevating the head of your bed, avoiding eating 2-3 hours before bed, avoiding triggering foods such as acidic, spicy, or fatty foods, eating smaller meals, and wearing clothes that are loose around the waist    Health Maintenance -Vaccine gaps: shingles -Current therapy:  Multivitamin 1 tablet daily (300 mg, 1000 units of vitamin D) Thiamine 100 mg 1 tablet daily Folic acid 1 mg 1 tablet daily  -Educated on Cost vs benefit of each product must be carefully weighed by individual consumer -Patient is satisfied with current therapy and denies issues -Recommended to continue current medication  Patient Goals/Self-Care Activities Over the next 180 days, patient will:  - take medications as  prescribed check blood pressure weekly, document, and provide at future appointments  Follow Up Plan: Telephone follow up appointment with care management team member scheduled for: 6 months        Medication Assistance: None required.  Patient affirms current coverage meets needs.  Compliance/Adherence/Medication fill history: Care Gaps: Shingrix, COVID booster, Hep C screening, influenza   Star-Rating Drugs: None  Patient's preferred pharmacy is:  [facility] [address] [place], [place] - [place] [address] Phone: [phone] Fax: [phone]  [id number] [place],  [place] [facility] [place] [address] Phone: [phone] Fax: [place], [place] [facility] [place] [address] Phone: [phone] Fax: [phone]  Uses pill box? No - gives them all once a day except gabapentin Pt endorses 99% compliance -  Hardly ever  We discussed: Benefits of medication synchronization, packaging and delivery as well as enhanced pharmacist oversight with [employer]. Patient decided to: Continue current medication management strategy  Care Plan and Follow Up Patient Decision:  Patient agrees to Care Plan and Follow-up.  Plan: Telephone follow up appointment with care management team member scheduled for:  6 months  Jeni Salles, PharmD [place] Pharmacist [place] [facility] [place] [phone]

## 2021-03-07 NOTE — Patient Instructions (Signed)
Hi Angline and Leonette Most,  It was great to get to speak with you both again! I attached some information about checking your blood pressure that you may find helpful. I will also have my assistant Chelsie reach out to you guys in a month or so just to check back in on those BP readings.   Please reach out to me if you have any questions or need anything before our follow up!  Best, Maddie  Gaylord Shih, PharmD, [facility] Clinical Pharmacist [facility] [place] [phone]   Visit Information   Goals Addressed   None    Patient Care Plan: CCM Phamracy Care Plan     Problem Identified: Problem: Hypertension, Osteoporosis and diverticulosis and pain and anxiety      Long-Range Goal: Patient-Specific Goal   Start Date: 09/05/2020  Expected End Date: 09/05/2021  Recent Progress: On track  Priority: High  Note:   Current Barriers:  Unable to independently monitor therapeutic efficacy  Pharmacist Clinical Goal(s):  Patient will achieve adherence to monitoring guidelines and medication adherence to achieve therapeutic efficacy through collaboration with PharmD and provider.    Interventions: 1:1 collaboration with Philip Aspen, Limmie Patricia, MD regarding development and update of comprehensive plan of care as evidenced by provider attestation and co-signature Inter-disciplinary care team collaboration (see longitudinal plan of care) Comprehensive medication review performed; medication list updated in electronic medical record  Hypertension (BP goal <140/90) -Controlled -Current treatment: No medications -Medications previously tried: amlodipine (orthostatic hypotension), lisinopril  -Current home readings: 120/75 usually; 150/82 this morning -Current dietary habits: patient does not like salt in her foods (except with french fries), has had a history of hyponatremia -Current exercise habits: patient tries to walk daily -Denies hypotensive/hypertensive  symptoms -Educated on Daily salt intake goal < 2300 mg; Importance of home blood pressure monitoring; Proper BP monitoring technique; -Counseled to monitor BP at home weekly, document, and provide log at future appointments -Counseled on diet and exercise extensively  Anxiety (Goal: minimize symptoms of anxiety) -Controlled -Current treatment: Gabapentin 100 mg 1 tablet twice daily -Medications previously tried/failed: none -GAD7: n/a -Educated on Benefits of medication for symptom control -Recommended to continue current medication  Osteoporosis (Goal improve bone density and prevent fractures) -Controlled -Last DEXA Scan: 06/07/2019  T-Score femoral neck: -3.3  T-Score total hip: n/a  T-Score lumbar spine: -4.7  T-Score forearm radius: n/a  10-year probability of major osteoporotic fracture: n/a  10-year probability of hip fracture: n/a -Patient is a candidate for pharmacologic treatment due to T-Score < -2.5 in femoral neck and T-Score < -2.5 in lumbar spine -Current treatment  Prolia injection once every 6 months Calcium with vitamin D 400 mg-500 units of vitamin - 1 tablet daily Multivitamin 1 tablet daily (300 mg, 1000 units of vitamin D) -Medications previously tried: none  -Recommend [phone] units of vitamin D daily. Recommend 1200 mg of calcium daily from dietary and supplemental sources. Recommend weight-bearing and muscle strengthening exercises for building and maintaining bone density. -Recommended to continue current medication  Pain (Goal: minimize symptoms of pain) -Controlled -Current treatment  Acetaminophen 500 mg 1 tablet every 6 hours as needed  (taking once daily) -Medications previously tried: none  -Recommended to continue current medication Counseled on maximum dose of 3,000 mg per day of Tylenol  Diverticulosis (Goal: minimize symptoms) -Controlled -Current treatment  Pantoprazole 40 mg 1 tablet daily -Medications previously tried: none   -Recommended to continue current medication Counseled on avoiding greasy foods, foods full of fiber, and other  non-pharmacologic  non-pharmacologic management of symptoms such as elevating the head of your bed, avoiding eating 2-3 hours before bed, avoiding triggering foods such as acidic, spicy, or fatty foods, eating smaller meals, and wearing clothes that are loose around the waist    Health Maintenance -Vaccine gaps: shingles -Current therapy:  Multivitamin 1 tablet daily (300 mg, 1000 units of vitamin D) Thiamine 100 mg 1 tablet daily Folic acid 1 mg 1 tablet daily  -Educated on Cost vs benefit of each product must be carefully weighed by individual consumer -Patient is satisfied with current therapy and denies issues -Recommended to continue current medication  Patient Goals/Self-Care Activities Over the next 180 days, patient will:  - take medications as prescribed check blood pressure weekly, document, and provide at future appointments  Follow Up Plan: Telephone follow up appointment with care management team member scheduled for: 6 months       Patient verbalizes understanding of instructions provided today and agrees to view in MyChart.  Telephone follow up appointment with pharmacy team member scheduled for: 6 months  Verner Chol, [facility]

## 2021-03-28 DIAGNOSIS — Z20822 Contact with and (suspected) exposure to covid-19: Secondary | ICD-10-CM | POA: Diagnosis not present

## 2021-04-11 ENCOUNTER — Telehealth: Payer: Self-pay | Admitting: Pharmacist

## 2021-04-11 ENCOUNTER — Other Ambulatory Visit: Payer: Self-pay | Admitting: Internal Medicine

## 2021-04-11 DIAGNOSIS — K5720 Diverticulitis of large intestine with perforation and abscess without bleeding: Secondary | ICD-10-CM

## 2021-04-11 NOTE — Chronic Care Management (AMB) (Signed)
Chronic Care Management Pharmacy Assistant   Name: [name]  MRN: [id number] DOB: 10-08-1950   Reason for Encounter: Disease State/ Hypertension Assessment Call.   Conditions to be addressed/monitored: HTN   Recent office visits:  None.  Recent consult visits:  None.  Hospital visits:  None in previous 6 months  Medications: Outpatient Encounter Medications as of 04/11/2021  Medication Sig Note   acetaminophen (TYLENOL) 500 MG tablet Take 500 mg by mouth every 6 (six) hours as needed.    calcium carbonate (OS-CAL - DOSED IN MG OF ELEMENTAL CALCIUM) 1250 (500 Ca) MG tablet Take 1 tablet by mouth daily.     denosumab (PROLIA) 60 MG/ML SOSY injection Inject 60 mg into the skin every 6 (six) months. 02/15/2020: Last dose:10/2019   folic acid (FOLVITE) 1 MG tablet TAKE 1 TABLET BY MOUTH EVERY DAY    gabapentin (NEURONTIN) 100 MG capsule TAKE 1 CAPSULE BY MOUTH TWICE A DAY    Multiple Vitamin (MULTIVITAMIN WITH MINERALS) TABS tablet Take 1 tablet by mouth daily. Centrum Silver    pantoprazole (PROTONIX) 40 MG tablet TAKE 1 TABLET BY MOUTH EVERY DAY    thiamine 100 MG tablet Take 1 tablet (100 mg total) by mouth daily.    No facility-administered encounter medications on file as of 04/11/2021.   Fill History: FOLIC ACID 1 MG TABLET 02/01/2021 90   GABAPENTIN 100 MG CAPSULE 01/24/2021 90   PANTOPRAZOLE SOD DR 40 MG TAB 01/13/2021 90   Reviewed chart prior to disease state call. Spoke with patient regarding BP  Recent Office Vitals: BP Readings from Last 3 Encounters:  04/10/20 130/80  03/14/20 135/73  02/29/20 (!) 145/69   Pulse Readings from Last 3 Encounters:  04/10/20 62  03/14/20 64  02/29/20 (!) 53    Wt Readings from Last 3 Encounters:  04/10/20 106 lb 3.2 oz (48.2 kg)  03/13/20 108 lb 0.4 oz (49 kg)  02/29/20 112 lb (50.8 kg)     Kidney Function Lab Results  Component Value Date/Time   CREATININE 0.58 04/10/2020 08:18 AM   CREATININE 0.33 (L)  03/12/2020 04:57 AM   CREATININE 0.40 (L) 03/11/2020 06:02 AM   GFR 125.67 12/13/2019 01:59 PM   GFRNONAA >60 03/12/2020 04:57 AM   GFRAA >60 03/12/2020 04:57 AM    BMP Latest Ref Rng & Units 04/10/2020 03/12/2020 03/11/2020  Glucose 65 - 99 mg/dL 96 109(N) 235(T)  BUN 7 - 25 mg/dL 17 6(L) <7(D)  Creatinine 0.50 - 0.99 mg/dL 2.20 2.54(Y) 7.06(C)  BUN/Creat Ratio 6 - 22 (calc) NOT APPLICABLE - -  Sodium 135 - 146 mmol/L 130(L) 128(L) 130(L)  Potassium 3.5 - 5.3 mmol/L 3.9 3.8 4.0  Chloride 98 - 110 mmol/L 92(L) 93(L) 98  CO2 20 - 32 mmol/L 28 23 24   Calcium 8.6 - 10.4 mg/dL 9.4 ) 7.8(L)    Current antihypertensive regimen:  No medications How often are you checking your Blood Pressure? 1-2x per week Current home BP readings: 116/66 on 04/11/21 What recent interventions/DTPs have been made by any provider to improve Blood Pressure control since last CPP Visit: None. Any recent hospitalizations or ED visits since last visit with CPP? No  Adherence Review: Is the patient currently on ACE/ARB medication? No Does the patient have >5 day gap between last estimated fill dates? No  Notes: Spoke with patients husband charles. We reviewed all of patients medications as listed. Char;es reports no changes or issues with patients medications. 04/13/21 does add salt  to patients food and he stated that patient has had issues with low sodium in the past. Patient usually has some whole grain cereal for breakfast and a lunchable for lunch. Patient will have chicken and vegetables for dinner usually. Patient drinks plenty of water through out the day. Leonette Most states that patient walks around stores with him when then go out. Leonette Most thanked me for my call.   Care Gaps:  AWV - message sent to Carrie Mew CMA to schedule  Hepatitis C screening - never done Zoster vaccines - never done Covid -19 vaccine booster 4 - overdue since 09/17/20 Flu vaccine - due  Star Rating Drugs:  None.  Joycelyn Das  CMA  Clinical Pharmacist Assistant [phone]

## 2021-04-16 NOTE — Telephone Encounter (Cosign Needed)
2nd attempt

## 2021-04-24 ENCOUNTER — Other Ambulatory Visit: Payer: Self-pay | Admitting: Internal Medicine

## 2021-04-24 DIAGNOSIS — K5720 Diverticulitis of large intestine with perforation and abscess without bleeding: Secondary | ICD-10-CM

## 2021-04-30 ENCOUNTER — Encounter: Payer: PPO | Admitting: Internal Medicine

## 2021-05-04 ENCOUNTER — Other Ambulatory Visit: Payer: Self-pay | Admitting: Internal Medicine

## 2021-05-04 DIAGNOSIS — F1020 Alcohol dependence, uncomplicated: Secondary | ICD-10-CM

## 2021-05-29 ENCOUNTER — Other Ambulatory Visit: Payer: Self-pay

## 2021-05-30 ENCOUNTER — Encounter: Payer: Self-pay | Admitting: Internal Medicine

## 2021-05-30 ENCOUNTER — Ambulatory Visit (INDEPENDENT_AMBULATORY_CARE_PROVIDER_SITE_OTHER): Payer: PPO | Admitting: Internal Medicine

## 2021-05-30 VITALS — BP 110/70 | HR 58 | Temp 97.7°F | Ht 63.0 in | Wt 105.4 lb

## 2021-05-30 DIAGNOSIS — I10 Essential (primary) hypertension: Secondary | ICD-10-CM

## 2021-05-30 DIAGNOSIS — Z1231 Encounter for screening mammogram for malignant neoplasm of breast: Secondary | ICD-10-CM | POA: Diagnosis not present

## 2021-05-30 DIAGNOSIS — Z0000 Encounter for general adult medical examination without abnormal findings: Secondary | ICD-10-CM

## 2021-05-30 DIAGNOSIS — M810 Age-related osteoporosis without current pathological fracture: Secondary | ICD-10-CM | POA: Diagnosis not present

## 2021-05-30 DIAGNOSIS — E559 Vitamin D deficiency, unspecified: Secondary | ICD-10-CM

## 2021-05-30 DIAGNOSIS — Z23 Encounter for immunization: Secondary | ICD-10-CM

## 2021-05-30 DIAGNOSIS — H539 Unspecified visual disturbance: Secondary | ICD-10-CM | POA: Diagnosis not present

## 2021-05-30 DIAGNOSIS — E538 Deficiency of other specified B group vitamins: Secondary | ICD-10-CM

## 2021-05-30 DIAGNOSIS — I700 Atherosclerosis of aorta: Secondary | ICD-10-CM | POA: Diagnosis not present

## 2021-05-30 LAB — CBC WITH DIFFERENTIAL/PLATELET
Basophils Absolute: 0 10*3/uL (ref 0.0–0.1)
Basophils Relative: 0.5 % (ref 0.0–3.0)
Eosinophils Absolute: 0.1 10*3/uL (ref 0.0–0.7)
Eosinophils Relative: 0.9 % (ref 0.0–5.0)
HCT: 39.8 % (ref 36.0–46.0)
Hemoglobin: 13.3 g/dL (ref 12.0–15.0)
Lymphocytes Relative: 21.1 % (ref 12.0–46.0)
Lymphs Abs: 1.5 10*3/uL (ref 0.7–4.0)
MCHC: 33.5 g/dL (ref 30.0–36.0)
MCV: 97.4 fl (ref 78.0–100.0)
Monocytes Absolute: 0.6 10*3/uL (ref 0.1–1.0)
Monocytes Relative: 8.9 % (ref 3.0–12.0)
Neutro Abs: 4.9 10*3/uL (ref 1.4–7.7)
Neutrophils Relative %: 68.6 % (ref 43.0–77.0)
Platelets: 241 10*3/uL (ref 150.0–400.0)
RBC: 4.09 Mil/uL (ref 3.87–5.11)
RDW: 13.4 % (ref 11.5–15.5)
WBC: 7.1 10*3/uL (ref 4.0–10.5)

## 2021-05-30 LAB — COMPREHENSIVE METABOLIC PANEL
ALT: 14 U/L (ref 0–35)
AST: 22 U/L (ref 0–37)
Albumin: 4.6 g/dL (ref 3.5–5.2)
Alkaline Phosphatase: 74 U/L (ref 39–117)
BUN: 12 mg/dL (ref 6–23)
CO2: 31 mEq/L (ref 19–32)
Calcium: 8.4 mg/dL (ref 8.4–10.5)
Chloride: 96 mEq/L (ref 96–112)
Creatinine, Ser: 0.63 mg/dL (ref 0.40–1.20)
GFR: 90.62 mL/min (ref >60.00)
Glucose, Bld: 81 mg/dL (ref 70–99)
Potassium: 3.2 mEq/L — ABNORMAL LOW (ref 3.5–5.1)
Sodium: 135 mEq/L (ref 135–145)
Total Bilirubin: 0.7 mg/dL (ref 0.2–1.2)
Total Protein: 7.2 g/dL (ref 6.0–8.3)

## 2021-05-30 LAB — LIPID PANEL
Cholesterol: 209 mg/dL — ABNORMAL HIGH (ref 0–200)
HDL: 91.4 mg/dL (ref >39.00)
LDL Cholesterol: 105 mg/dL — ABNORMAL HIGH (ref 0–99)
NonHDL: 117.75
Total CHOL/HDL Ratio: 2
Triglycerides: 63 mg/dL (ref 0.0–149.0)
VLDL: 12.6 mg/dL (ref 0.0–40.0)

## 2021-05-30 LAB — VITAMIN B12: Vitamin B-12: 335 pg/mL (ref 211–911)

## 2021-05-30 LAB — HEMOGLOBIN A1C: Hgb A1c MFr Bld: 5.9 % (ref 4.6–6.5)

## 2021-05-30 LAB — TSH: TSH: 1.36 u[IU]/mL (ref 0.35–5.50)

## 2021-05-30 LAB — VITAMIN D 25 HYDROXY (VIT D DEFICIENCY, FRACTURES): VITD: 47.67 ng/mL (ref 30.00–100.00)

## 2021-05-30 NOTE — Addendum Note (Signed)
Addended by: Kern Reap B on: 05/30/2021 09:39 AM   Modules accepted: Orders

## 2021-05-30 NOTE — Progress Notes (Signed)
Established Patient Office Visit     This visit occurred during the SARS-CoV-2 public health emergency.  Safety protocols were in place, including screening questions prior to the visit, additional usage of staff PPE, and extensive cleaning of exam room while observing appropriate contact time as indicated for disinfecting solutions.    CC/Reason for Visit: Annual preventive exam and subsequent Medicare wellness visit  HPI: [name] is a 70 y.o. female who is coming in today for the above mentioned reasons. Past Medical History is significant for: Vitamin D and B12 deficiencies, osteoporosis on Prolia, in 2021 she developed an intra-abdominal abscess secondary to diverticular disease and is status post a sigmoid colectomy.  She is doing well from the standpoint, she has no acute complaints today.  She is overdue for eye and dental care.  No perceived hearing issues.  She exercises by walking approximately 30 minutes a day.  She is overdue for flu, shingles and COVID booster.  She is overdue for repeat bone mineral density.  She is overdue for mammogram.  She had a colonoscopy in 2021.   Past Medical/Surgical History: Past Medical History:  Diagnosis Date   Alcoholism ([place]) 02/02/2009   ANXIETY 01/21/2007   Arthritis    Blood transfusion    hx of 2005    Cognitive communication disorder    per husband has some cognitive issues    Depression    Diverticulitis    Diverticulitis of large intestine with abscess 04/05/2019   DIVERTICULOSIS 07/09/2007   Duodenal ulcer hemorrhage perforated 2006   GERD (gastroesophageal reflux disease)    Headache(784.0) 05/31/2007   pt denies at 08/12/11 preop visit    HYPERTENSION 06/23/2007   HYPONATREMIA 02/02/2009   better off diuretic   INSOMNIA 05/31/2007   Iron deficiency anemia    pt denies at 08/12/11 visit    Marginal ulcer, at gastrojejunostomy, resected 08/14/2011. 09/19/2011   NSAID-induced duodenal ulcer    OSTEOPOROSIS 01/21/2007    Raynaud's syndrome 06/23/2007   Recurrent upper respiratory infection (URI)    pt reports slight cold using Nyquil prn at bedtime    Urinary tract infection 11/23/2019   VITAMIN B12 DEFICIENCY 10/02/2009   Zoster 2012   twice - left groin/vagina    Past Surgical History:  Procedure Laterality Date   COLECTOMY N/A 03/09/2020   Procedure: OPEN SIGMOID COLECTOMY WITH RIGID PROCTOSCOPY;  Surgeon: Leighton Ruff, MD;  Location: [facility];  Service: General;  Laterality: N/A;   COLONOSCOPY  06/18/2007   angulated and stenotic sigmoid colon with severe sigmoid diverticulosis   COLONOSCOPY WITH PROPOFOL N/A 01/26/2020   Procedure: COLONOSCOPY WITH PROPOFOL;  Surgeon: Milus Banister, MD;  Location: [facility];  Service: Endoscopy;  Laterality: N/A;  ultraslim scope   CYSTOSCOPY WITH STENT PLACEMENT Bilateral 03/09/2020   Procedure: CYSTOSCOPY WITH BILATERAL OPEN ENDED CATHETER PLACEMENT;  Surgeon: Lucas Mallow, MD;  Location: [facility];  Service: Urology;  Laterality: Bilateral;   EXPLORATORY LAPAROTOMY  08/03/2004   1) duodenal ulcer oversew, pyloroplasty, anterior, posterior and truncal vagotomies   EXPLORATORY LAPAROTOMY  10/20/2004   1) duodenal ulcer oversew  with exclusion2) side-side gastrojejunostomy 3) feeding jejunostomy   IR RADIOLOGIST EVAL & MGMT  04/26/2019   IR RADIOLOGIST EVAL & MGMT  05/10/2019   IR RADIOLOGIST EVAL & MGMT  06/07/2019   IR RADIOLOGIST EVAL & MGMT  12/06/2019   IR RADIOLOGIST EVAL & MGMT  12/22/2019   IR RADIOLOGIST EVAL & MGMT  01/10/2020  IR RADIOLOGIST EVAL & MGMT  01/31/2020   IR SINUS/FIST TUBE CHK-NON GI  04/12/2019   LAPAROSCOPY  08/14/2011   Procedure: LAPAROSCOPY DIAGNOSTIC;  Surgeon: Shann Medal, MD;  Location: [facility];  Service: General;  Laterality: N/A;   LAPAROTOMY  08/14/2011   Procedure: EXPLORATORY LAPAROTOMY;  Surgeon: Shann Medal, MD;  Location: [facility];  Service: General;  Laterality: N/A;  exploratory laparotomy, lysis of adhesions, revision  gastrojejunostomy, upper endoscopy   OPEN REDUCTION INTERNAL FIXATION (ORIF) DISTAL RADIAL FRACTURE Right 05/01/2016   Procedure: OPEN REDUCTION INTERNAL FIXATION (ORIF) DISTAL RADIAL FRACTURE, right;  Surgeon: Leanora Cover, MD;  Location: [place];  Service: Orthopedics;  Laterality: Right;   TUBAL LIGATION  1977   UPPER GASTROINTESTINAL ENDOSCOPY  09/16/2007; 12/21/2008; 03/17/2011   2009: Normal 2010: Normal with enteroscopy2012: Gastrojejunal  anastomotic ulcer, gastric retention    Social History:  reports that she quit smoking about 6 years ago. Her smoking use included cigarettes. She has never used smokeless tobacco. She reports that she does not currently use alcohol after a past usage of about 7.0 standard drinks per week. She reports that she does not use drugs.  Allergies: Allergies  Allergen Reactions   Sulfa Antibiotics Anaphylaxis and Rash   Bee Venom Swelling   Dilaudid [Hydromorphone Hcl] Hives   Penicillins Other (See Comments)    Has patient had a PCN reaction causing immediate rash, facial/tongue/throat swelling, SOB or lightheadedness with hypotension: No Has patient had a PCN reaction causing severe rash involving mucus membranes or skin necrosis: No Has patient had a PCN reaction that required hospitalization No Has patient had a PCN reaction occurring within the last 10 years: No If all of the above answers are "NO", then may proceed with Cephalosporin use.    Family History:  Family History  Problem Relation Age of Onset   Stroke Father    Colon cancer Neg Hx    Esophageal cancer Neg Hx    Pancreatic cancer Neg Hx    Liver disease Neg Hx    Colon polyps Neg Hx      Current Outpatient Medications:    acetaminophen (TYLENOL) 500 MG tablet, Take 500 mg by mouth every 6 (six) hours as needed., Disp: , Rfl:    calcium carbonate (OS-CAL - DOSED IN MG OF ELEMENTAL CALCIUM) 1250 (500 Ca) MG tablet, Take 1 tablet by mouth daily. , Disp: , Rfl:     denosumab (PROLIA) 60 MG/ML SOSY injection, Inject 60 mg into the skin every 6 (six) months., Disp: , Rfl:    folic acid (FOLVITE) 1 MG tablet, TAKE 1 TABLET BY MOUTH EVERY DAY, Disp: 30 tablet, Rfl: 0   gabapentin (NEURONTIN) 100 MG capsule, TAKE 1 CAPSULE BY MOUTH TWICE A DAY, Disp: 180 capsule, Rfl: 1   Multiple Vitamin (MULTIVITAMIN WITH MINERALS) TABS tablet, Take 1 tablet by mouth daily. Centrum Silver, Disp: , Rfl:    pantoprazole (PROTONIX) 40 MG tablet, TAKE 1 TABLET BY MOUTH EVERY DAY, Disp: 90 tablet, Rfl: 1   thiamine 100 MG tablet, Take 1 tablet (100 mg total) by mouth daily., Disp: 30 tablet, Rfl: 2  Review of Systems:  Constitutional: Denies fever, chills, diaphoresis, appetite change and fatigue.  HEENT: Denies photophobia, eye pain, redness, hearing loss, ear pain, congestion, sore throat, rhinorrhea, sneezing, mouth sores, trouble swallowing, neck pain, neck stiffness and tinnitus.   Respiratory: Denies SOB, DOE, cough, chest tightness,  and wheezing.   Cardiovascular: Denies chest pain, palpitations  and leg swelling.  Gastrointestinal: Denies nausea, vomiting, abdominal pain, diarrhea, constipation, blood in stool and abdominal distention.  Genitourinary: Denies dysuria, urgency, frequency, hematuria, flank pain and difficulty urinating.  Endocrine: Denies: hot or cold intolerance, sweats, changes in hair or nails, polyuria, polydipsia. Musculoskeletal: Denies myalgias, back pain, joint swelling, arthralgias and gait problem.  Skin: Denies pallor, rash and wound.  Neurological: Denies dizziness, seizures, syncope, weakness, light-headedness, numbness and headaches.  Hematological: Denies adenopathy. Easy bruising, personal or family bleeding history  Psychiatric/Behavioral: Denies suicidal ideation, mood changes, confusion, nervousness, sleep disturbance and agitation    Physical Exam: Vitals:   05/30/21 0756  BP: 110/70  Pulse: (!) 58  Temp: 97.7 F (36.5 C)   TempSrc: Oral  SpO2: 98%  Weight: 105 lb 6.4 oz (47.8 kg)  Height: 5\' 3"  (1.6 m)    Body mass index is 18.67 kg/m.   Constitutional: NAD, calm, comfortable Eyes: PERRL, lids and conjunctivae normal, wears corrective lenses ENMT: Mucous membranes are moist. Posterior pharynx clear of any exudate or lesions. Normal dentition. Tympanic membrane is pearly white, no erythema or bulging. Neck: normal, supple, no masses, no thyromegaly Respiratory: clear to auscultation bilaterally, no wheezing, no crackles. Normal respiratory effort. No accessory muscle use.  Cardiovascular: Regular rate and rhythm, no murmurs / rubs / gallops. No extremity edema. 2+ pedal pulses. No carotid bruits.  Abdomen: no tenderness, no masses palpated. No hepatosplenomegaly. Bowel sounds positive.  Musculoskeletal: no clubbing / cyanosis. No joint deformity upper and lower extremities. Good ROM, no contractures. Normal muscle tone.  Skin: no rashes, lesions, ulcers. No induration Neurologic: CN 2-12 grossly intact. Sensation intact, DTR normal. Strength 5/5 in all 4.  Psychiatric: Normal judgment and insight. Alert and oriented x 3. Normal mood.    Subsequent Medicare wellness visit   1. Risk factors, based on past  M,S,F -cardiovascular disease risk factors include age only   2.  Physical activities: Walks on a daily basis   3.  Depression/mood: Stable, not depressed   4.  Hearing: No perceived issues   5.  ADL's: Independent in all ADLs   6.  Fall risk: Moderate fall risk due to prior history of falls   7.  Home safety: No problems identified   8.  Height weight, and visual acuity: height and weight as above, vision: 20/30 with correction in both eyes     9.  Counseling: Advise she update vaccination status   10. Lab orders based on risk factors: Laboratory update will be reviewed   11. Referral : Ophthalmology, mammogram, DEXA   12. Care plan: Follow-up with me in 6 months   13. Cognitive  assessment: No cognitive impairment   14. Screening: Patient provided with a written and personalized 5-10 year screening schedule in the AVS. yes   15. Provider List Update: PCP only  16. Advance Directives: Full code   17. Opioids: Patient is not on any opioid prescriptions and has no risk factors for a substance use disorder.   [place] Office Visit from 05/30/2021 in [place] at [place]  PHQ-9 Total Score 2       Fall Risk 03/12/2020 03/13/2020 03/13/2020 04/10/2020 05/30/2021  Falls in the past year? - - - 0 0  Was there an injury with Fall? - - - 0 0  Fall Risk Category Calculator - - - 0 0  Fall Risk Category - - - Low Low  Patient Fall Risk Level Moderate fall risk Moderate fall risk Moderate fall  risk - -  Patient at Risk for Falls Due to - - - - -     Impression and Plan:  Encounter for screening mammogram for malignant neoplasm of breast  - Plan: MM Digital Screening  Vitamin D deficiency  - Plan: VITAMIN D 25 Hydroxy (Vit-D Deficiency, Fractures), DG Bone Density, VITAMIN D 25 Hydroxy (Vit-D Deficiency, Fractures), CANCELED: DG Bone Density  Osteoporosis, unspecified osteoporosis type, unspecified pathological fracture presence  - Plan: DG Bone Density  Vision changes  - Plan: Ambulatory referral to Ophthalmology  Encounter for preventive health examination -Recommend routine eye and dental care. -Immunizations: Flu vaccine in office today, she will get COVID booster and shingles series at pharmacy -Healthy lifestyle discussed in detail. -Labs to be updated today. -Colon cancer screening: 01/2020 -Breast cancer screening: Overdue, mammogram ordered -Cervical cancer screening: She elects to no longer pursue -Lung cancer screening: Not applicable -Prostate cancer screening: Not applicable -DEXA: 05/2019, repeat DEXA requested today  Aortic atherosclerosis (HCC)  - Plan: Lipid panel, Lipid panel  Need for influenza vaccination -Flu  vaccine administered in office today.  Essential hypertension  - Plan: CBC with Differential/Platelet, Comprehensive metabolic panel, Hemoglobin A1c, TSH, CBC with Differential/Platelet, Comprehensive metabolic panel, Hemoglobin A1c, TSH -Blood pressure is well controlled, she is not on any medications.  B12 deficiency  - Plan: Vitamin B12, Vitamin B12    Patient Instructions  -Nice seeing you today!!  -Lab work today; will notify you once results are available.  -Flu vaccine today.  -Remember COVID booster and shingles vaccines at the pharmacy.  -Schedule follow up in 6 months.     Chaya Jan, MD [facility] at [facility]

## 2021-05-30 NOTE — Patient Instructions (Signed)
-  Nice seeing you today!!  -Lab work today; will notify you once results are available.  -Flu vaccine today.  -Remember COVID booster and shingles vaccines at the pharmacy.  -Schedule follow up in 6 months. 

## 2021-05-31 ENCOUNTER — Other Ambulatory Visit: Payer: Self-pay | Admitting: Internal Medicine

## 2021-05-31 DIAGNOSIS — E876 Hypokalemia: Secondary | ICD-10-CM

## 2021-05-31 MED ORDER — POTASSIUM CHLORIDE CRYS ER 20 MEQ PO TBCR: 20.0000 meq | EXTENDED_RELEASE_TABLET | Freq: Every day | ORAL | 0 refills | Status: DC

## 2021-06-02 ENCOUNTER — Other Ambulatory Visit: Payer: Self-pay | Admitting: Internal Medicine

## 2021-06-02 DIAGNOSIS — F1020 Alcohol dependence, uncomplicated: Secondary | ICD-10-CM

## 2021-06-05 ENCOUNTER — Other Ambulatory Visit: Payer: Self-pay | Admitting: Internal Medicine

## 2021-06-05 DIAGNOSIS — E876 Hypokalemia: Secondary | ICD-10-CM

## 2021-06-19 ENCOUNTER — Other Ambulatory Visit (INDEPENDENT_AMBULATORY_CARE_PROVIDER_SITE_OTHER): Payer: PPO

## 2021-06-19 DIAGNOSIS — E876 Hypokalemia: Secondary | ICD-10-CM | POA: Diagnosis not present

## 2021-06-19 LAB — BASIC METABOLIC PANEL
BUN: 11 mg/dL (ref 6–23)
CO2: 30 mEq/L (ref 19–32)
Calcium: 8.8 mg/dL (ref 8.4–10.5)
Chloride: 99 mEq/L (ref 96–112)
Creatinine, Ser: 0.62 mg/dL (ref 0.40–1.20)
GFR: 90.94 mL/min (ref >60.00)
Glucose, Bld: 105 mg/dL — ABNORMAL HIGH (ref 70–99)
Potassium: 4.3 mEq/L (ref 3.5–5.1)
Sodium: 134 mEq/L — ABNORMAL LOW (ref 135–145)

## 2021-06-25 DIAGNOSIS — H2513 Age-related nuclear cataract, bilateral: Secondary | ICD-10-CM | POA: Diagnosis not present

## 2021-06-25 DIAGNOSIS — H02831 Dermatochalasis of right upper eyelid: Secondary | ICD-10-CM | POA: Diagnosis not present

## 2021-06-25 DIAGNOSIS — H25013 Cortical age-related cataract, bilateral: Secondary | ICD-10-CM | POA: Diagnosis not present

## 2021-07-25 ENCOUNTER — Ambulatory Visit
Admission: RE | Admit: 2021-07-25 | Discharge: 2021-07-25 | Disposition: A | Discharge status: Home / Self Care | Payer: PPO | Source: Ambulatory Visit | Attending: Internal Medicine | Admitting: Internal Medicine

## 2021-07-25 DIAGNOSIS — Z1231 Encounter for screening mammogram for malignant neoplasm of breast: Secondary | ICD-10-CM

## 2021-08-21 DIAGNOSIS — F0390 Unspecified dementia without behavioral disturbance: Secondary | ICD-10-CM | POA: Diagnosis not present

## 2021-08-30 ENCOUNTER — Telehealth: Payer: Self-pay | Admitting: Pharmacist

## 2021-08-30 NOTE — Chronic Care Management (AMB) (Signed)
° ° °  Chronic Care Management Pharmacy Assistant   Name: Taleeyah Bora  MRN: [id number] DOB: 29-Sep-1950  09/03/2021 APPOINTMENT REMINDER   Called Nanda Quinton, No answer, left message of appointment on 09/03/2021 at 2:00 via telephone visit with Gaylord Shih, Pharm D. Notified to have all medications, supplements, blood pressure and/or blood sugar logs available during appointment and to return call if need to reschedule.  Care Gaps: AWV - message sent to Carrie Mew Last BP - 110/70 on 05/30/2021 Last A1C - 5.9 on 05/30/2021 Hep C Screen - never done  Star Rating Drug: None  Any gaps in medications fill history?No  Inetta Fermo [facility]  Programme researcher, broadcasting/film/video [phone]

## 2021-09-03 ENCOUNTER — Ambulatory Visit (INDEPENDENT_AMBULATORY_CARE_PROVIDER_SITE_OTHER): Payer: PPO | Admitting: Pharmacist

## 2021-09-03 DIAGNOSIS — M810 Age-related osteoporosis without current pathological fracture: Secondary | ICD-10-CM

## 2021-09-03 DIAGNOSIS — I10 Essential (primary) hypertension: Secondary | ICD-10-CM

## 2021-09-03 NOTE — Progress Notes (Signed)
Chronic Care Management Pharmacy Note  09/03/2021 Name:  [name] MRN:  [id number] DOB:  1950-12-06  Summary: Pt is overdue for Prolia injection Pt stopped checking BP at home   Recommendations/Changes made from today's visit: -Discussed proper BP monitoring technique and continued monitoring at home at least once a week -Recommended scheduling next dose of Prolia as previous was > 6 months prior   Plan: Follow up to schedule Prolia BP assessment in 3 months  Subjective: [name] is an 71 y.o. year old female who is a primary patient of [name], Kirsten Halsted, MD.  The CCM team was consulted for assistance with disease management and care coordination needs.    Engaged with patient by telephone for follow up visit in response to provider referral for pharmacy case management and/or care coordination services.   Consent to Services:  The patient was given information about Chronic Care Management services, agreed to services, and gave verbal consent prior to initiation of services.  Please see initial visit note for detailed documentation.   Patient Care Team: [name], Kirsten Halsted, MD as PCP - General (Internal Medicine) Kirsten Mayer, MD as Consulting Physician (Gastroenterology) [name], [facility] as Pharmacist (Pharmacist)  Recent office visits: 05/30/21 Kirsten Frohlich, MD: Patient presented for annual exam. Influenza vaccines administered.  Prescribed potassium chloride 20 mEq daily for 5 days.  Recent consult visits: 06/25/21 [name] (ophthalmology): Patient presented for eye exam. Unable to access notes.  Hospital visits: None in previous 6 months  Objective:  Lab Results  Component Value Date   CREATININE 0.62 06/19/2021   BUN 11 06/19/2021   GFR 90.94 06/19/2021   GFRNONAA >60 03/12/2020   GFRAA >60 03/12/2020   NA 134 (L) 06/19/2021   K 4.3 06/19/2021   CALCIUM 8.8 06/19/2021   CO2 30 06/19/2021    Lab  Results  Component Value Date/Time   HGBA1C 5.9 05/30/2021 08:51 AM   HGBA1C 5.4 04/10/2020 08:18 AM   GFR 90.94 06/19/2021 02:02 PM   GFR 90.62 05/30/2021 08:51 AM    Last diabetic Eye exam: No results found for: HMDIABEYEEXA  Last diabetic Foot exam: No results found for: HMDIABFOOTEX   Lab Results  Component Value Date   Rivera 209 (H) 05/30/2021   HDL 91.40 05/30/2021   LDLCALC 105 (H) 05/30/2021   TRIG 63.0 05/30/2021   CHOLHDL 2 05/30/2021    Hepatic Function Latest Ref Rng & Units 05/30/2021 04/10/2020 11/26/2019  Total Protein 6.0 - 8.3 g/dL 7.2 6.9 5.2(L)  Albumin 3.5 - 5.2 g/dL 4.6 - 2.5(L)  AST 0 - 37 U/L $Remo'22 16 18  'TCxkx$ ALT 0 - 35 U/L $Remo'14 11 13  'xNoOR$ Alk Phosphatase 39 - 117 U/L 74 - 68  Total Bilirubin 0.2 - 1.2 mg/dL 0.7 0.6 0.4  Bilirubin, Direct 0.0 - 0.2 mg/dL - - -    Lab Results  Component Value Date/Time   TSH 1.36 05/30/2021 08:51 AM   TSH 1.75 04/10/2020 08:18 AM    CBC Latest Ref Rng & Units 05/30/2021 04/10/2020 03/12/2020  WBC 4.0 - 10.5 K/uL 7.1 7.2 10.5  Hemoglobin 12.0 - 15.0 g/dL 13.3 13.5 11.7(L)  Hematocrit 36.0 - 46.0 % 39.8 39.6 33.9(L)  Platelets 150.0 - 400.0 K/uL 241.0 260 282    Lab Results  Component Value Date/Time   VD25OH 47.67 05/30/2021 08:51 AM   VD25OH 38 04/10/2020 08:18 AM   VD25OH 31.43 12/13/2019 01:59 PM    Clinical ASCVD:  No  The 10-year ASCVD risk score (Arnett DK, et al., 2019) is: 5.8%   Values used to calculate the score:     Age: 89 years     Sex: Female     Is Non-Hispanic African American: No     Diabetic: No     Tobacco smoker: No     Systolic Blood Pressure: 110 mmHg     Is BP treated: No     HDL Cholesterol: 91.4 mg/dL     Total Cholesterol: 209 mg/dL    Depression screen [facility] 2/9 05/30/2021 04/10/2020 06/03/2019  Decreased Interest 0 0 0  Down, Depressed, Hopeless 0 0 0  PHQ - 2 Score 0 0 0  Altered sleeping 0 0 0  Tired, decreased energy 0 0 0  Change in appetite 0 0 0  Feeling bad or failure about yourself   0 0 0  Trouble concentrating 1 0 0  Moving slowly or fidgety/restless 1 0 0  Suicidal thoughts 0 0 0  PHQ-9 Score 2 0 0  Difficult doing work/chores Not difficult at all Not difficult at all Not difficult at all  Some recent data might be hidden      Social History   Tobacco Use  Smoking Status Former   Types: Cigarettes   Quit date: 07/21/2014   Years since quitting: 7.1  Smokeless Tobacco Never   BP Readings from Last 3 Encounters:  05/30/21 110/70  04/10/20 130/80  03/14/20 135/73   Pulse Readings from Last 3 Encounters:  05/30/21 (!) 58  04/10/20 62  03/14/20 64   Wt Readings from Last 3 Encounters:  05/30/21 105 lb 6.4 oz (47.8 kg)  04/10/20 106 lb 3.2 oz (48.2 kg)  03/13/20 108 lb 0.4 oz (49 kg)    Assessment/Interventions: Review of patient past medical history, allergies, medications, health status, including review of consultants reports, laboratory and other test data, was performed as part of comprehensive evaluation and provision of chronic care management services.   SDOH:  (Social Determinants of Health) assessments and interventions performed: No   CCM Care Plan  Allergies  Allergen Reactions   Sulfa Antibiotics Anaphylaxis and Rash   Bee Venom Swelling   Dilaudid [Hydromorphone Hcl] Hives   Penicillins Other (See Comments)    Has patient had a PCN reaction causing immediate rash, facial/tongue/throat swelling, SOB or lightheadedness with hypotension: No Has patient had a PCN reaction causing severe rash involving mucus membranes or skin necrosis: No Has patient had a PCN reaction that required hospitalization No Has patient had a PCN reaction occurring within the last 10 years: No If all of the above answers are "NO", then may proceed with Cephalosporin use.    Medications Reviewed Today     Reviewed by [name], [facility] (Pharmacist) on 09/03/21 at 1415  Med List Status: <None>   Medication Order Taking? Sig Documenting Provider Last Dose  Status Informant  acetaminophen (TYLENOL) 500 MG tablet [id number]  Take 500 mg by mouth every 6 (six) hours as needed. [provider]  Active   calcium carbonate (OS-CAL - DOSED IN MG OF ELEMENTAL CALCIUM) 1250 (500 Ca) MG tablet [id number] Yes Take 1 tablet by mouth daily.  [provider] Taking Active Spouse/Significant Other  denosumab (PROLIA) 60 MG/ML SOSY injection [id number]  Inject 60 mg into the skin every 6 (six) months. [provider]  Active Spouse/Significant Other           Med Note Tiburcio Pea, Jacqulyn Bath T  Wed Feb 15, 2020 12:27 PM) Last GBEE:04/711  folic acid (FOLVITE) 1 MG tablet [id number]  TAKE 1 TABLET BY MOUTH EVERY DAY [name], Kirsten Halsted, MD  Active   gabapentin (NEURONTIN) 100 MG capsule [id number]  TAKE 1 CAPSULE BY MOUTH TWICE A DAY [name], Kirsten Halsted, MD  Active   Multiple Vitamin (MULTIVITAMIN WITH MINERALS) TABS tablet [id number] Yes Take 1 tablet by mouth daily. Centrum Silver [provider] Taking Active Spouse/Significant Other  pantoprazole (PROTONIX) 40 MG tablet [id number]  TAKE 1 TABLET BY MOUTH EVERY DAY [name], Kirsten Halsted, MD  Active   potassium chloride SA (KLOR-CON) 20 MEQ tablet [id number]  Take 1 tablet (20 mEq total) by mouth daily for 5 days. [name], Kirsten Halsted, MD  Expired 06/05/21 2359   thiamine 100 MG tablet [id number]  Take 1 tablet (100 mg total) by mouth daily. 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    SHINGLES 08/19/2010   B12 deficiency 10/02/2009   Iron deficiency anemia 07/09/2007   DIVERTICULOSIS 07/09/2007   Essential hypertension 06/23/2007   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   INSOMNIA 05/31/2007   HEADACHE 05/31/2007   Anxiety state 01/21/2007   Alcoholism ([place]) 01/21/2007   Osteoporosis 01/21/2007    Immunization History  Administered Date(s) Administered   Fluad Quad(high Dose 65+) 04/11/2019, 04/10/2020, 05/30/2021   Influenza Split 04/08/2011, 04/15/2012   Influenza Whole 05/20/2007, 04/20/2008, 03/26/2010   Influenza,inj,Quad PF,6+ Mos 04/11/2014, 08/31/2015, 05/16/2016   PFIZER(Purple Top)SARS-COV-2 Vaccination 09/03/2019, 09/25/2019, 05/19/2020, 11/28/2020   Pneumococcal Conjugate-13 04/10/2020   Pneumococcal Polysaccharide-23 06/03/2019   Tdap 02/01/2014   Patient's husband reports he has not checked her blood pressure recently as it was good at her physical. He also did not bring in the BP cuff to the office to make sure it was accurate. Patient has been sleeping through the night.   Conditions to be addressed/monitored:  Hypertension, Osteoporosis and diverticulosis and  pain and anxiety  Conditions addressed this visit: Hypertension, osteoporosis  Care Plan : [place]  Updates made by [name], [place] since 09/03/2021 12:00 AM     Problem: Problem: Hypertension, Osteoporosis and diverticulosis and pain and anxiety      Long-Range Goal: Patient-Specific Goal   Start Date: 09/05/2020  Expected End Date: 09/05/2021  Recent Progress: On track  Priority: High  Note:   Current Barriers:  Unable to independently monitor therapeutic efficacy  Pharmacist Clinical Goal(s):  Patient will achieve adherence to monitoring guidelines and medication adherence to achieve therapeutic efficacy through collaboration with PharmD and provider.    Interventions: 1:1 collaboration with [name], Kirsten Halsted, MD regarding development and update of  comprehensive plan of care as evidenced by provider attestation and co-signature Inter-disciplinary care team collaboration (see longitudinal plan of care) Comprehensive medication review performed; medication list updated in electronic medical record  Hypertension (BP goal <140/90) -Controlled -Current treatment: No medications -Medications previously tried: amlodipine (orthostatic hypotension), lisinopril  -Current home readings:  not checking recently (once a week) -Current dietary habits: patient does not like salt in her foods (except with french fries), has had a history of hyponatremia -Current exercise habits: patient tries to walk daily; going to the store with him as well -Denies hypotensive/hypertensive symptoms -Educated on Daily salt intake goal < 2300 mg; Importance of home blood pressure monitoring; Proper BP monitoring technique; -Counseled to monitor BP at home weekly, document, and provide log at future appointments -Counseled on diet and exercise extensively  Anxiety (Goal: minimize symptoms of anxiety) -Controlled -Current treatment: Gabapentin 100 mg 1 tablet twice  - Appropriate, Effective, Safe, Accessible -Medications previously tried/failed: none -GAD7: n/a -Educated on Benefits of medication for symptom control -Recommended to continue current medication  Osteoporosis (Goal improve bone density and prevent fractures) -Controlled -Last DEXA Scan: 06/07/2019  T-Score femoral neck: -3.3  T-Score total hip: n/a  T-Score lumbar spine: -4.7  T-Score forearm radius: n/a  10-year probability of major osteoporotic fracture: n/a  10-year probability of hip fracture: n/a -Patient is a candidate for pharmacologic treatment due to T-Score < -2.5 in femoral neck and T-Score < -2.5 in lumbar spine -Current treatment  Prolia injection once every 6 months (last July 2022) - Appropriate, Effective, Safe, Accessible Calcium with vitamin D 600 mg-800 units of vitamin - 1  tablet daily - Appropriate, Effective, Safe, Accessible Multivitamin 1 tablet daily (300 mg, 1000 units of vitamin D) - Appropriate, Effective, Safe, Accessible -Medications previously tried: none  -Recommend [phone] units of vitamin D daily. Recommend 1200 mg of calcium daily from dietary and supplemental sources. Recommend weight-bearing and muscle strengthening exercises for building and maintaining bone density. -Recommended to continue current medication Recommended scheduling next dose of Prolia as previous was > 6 months.  Pain (Goal: minimize symptoms of pain) -Controlled -Current treatment  Acetaminophen 500 mg 1 tablet every 6 hours as needed  (taking once daily) -Medications previously tried: none  -Recommended to continue current medication Counseled on maximum dose of 3,000 mg per day of Tylenol  Diverticulosis (Goal: minimize symptoms) -Controlled -Current treatment  Pantoprazole 40 mg 1 tablet daily - Appropriate, Effective, Safe, Accessible -Medications previously tried: none  -Recommended to continue current medication Counseled on avoiding greasy foods, foods full of fiber, and other non-pharmacologic  non-pharmacologic management of symptoms such as elevating the head of your bed, avoiding eating 2-3 hours before bed, avoiding triggering foods such as acidic, spicy, or fatty foods, eating smaller meals, and wearing clothes that are loose around the waist    Health Maintenance -Vaccine gaps: shingles -Current therapy:  Multivitamin 1 tablet daily (300 mg, 1000 units of vitamin D) Thiamine 100 mg 1 tablet daily Folic acid 1 mg 1 tablet daily  -Educated on Cost vs benefit of each product must be carefully weighed by individual consumer -Patient is satisfied with current therapy and denies issues -Recommended to continue current medication  Patient Goals/Self-Care Activities Over the next 180 days, patient will:  - take medications as prescribed check blood  pressure weekly, document, and provide at future appointments  Follow Up Plan: Telephone follow up appointment with care management team member scheduled for: 6 months      Medication Assistance: None required.  Patient affirms current coverage meets needs.  Compliance/Adherence/Medication fill history: Care Gaps: Shingrix, COVID booster, Hep C screening Last BP - 110/70 on 05/30/2021 Last A1C - 5.9 on 05/30/2021   Star-Rating Drugs: None  Patient's preferred pharmacy is:  [place] [place] -  [address] [place] [address] Phone: [phone] Fax: [phone]  [id number] - [address] [place] [address] Phone: [phone] Fax: [place], [place] - [address] [address] Phone: [phone] Fax: [phone]   Uses pill box? No - gives them all once a day except gabapentin Pt endorses 99% compliance -  Hardly ever  We discussed: Benefits of medication synchronization, packaging and delivery as well as enhanced pharmacist oversight with [employer]. Patient decided to: Continue current medication management strategy  Care Plan and Follow Up Patient Decision:  Patient agrees to Care Plan and Follow-up.  Plan: Telephone follow up appointment with care management team member scheduled for:  6 months  Jeni Salles, PharmD [place] Pharmacist [place] at [place] [phone]

## 2021-09-03 NOTE — Patient Instructions (Signed)
Hi [name] and [name],  It was great to get to catch up with you again! Don't forget to restart checking your blood pressure at least a couple times a month at home to keep an eye on it.  I'll let you know what I can find out about scheduling the Prolia.  Please reach out to me if you have any questions or need anything !  Best, Maddie  Gaylord Shih, PharmD, [facility] Clinical Pharmacist [facility] [place] [phone]   Visit Information   Goals Addressed   None    Patient Care Plan: CCM Pharmacy Care Plan     Problem Identified: Problem: Hypertension, Osteoporosis and diverticulosis and pain and anxiety      Long-Range Goal: Patient-Specific Goal   Start Date: 09/05/2020  Expected End Date: 09/05/2021  Recent Progress: On track  Priority: High  Note:   Current Barriers:  Unable to independently monitor therapeutic efficacy  Pharmacist Clinical Goal(s):  Patient will achieve adherence to monitoring guidelines and medication adherence to achieve therapeutic efficacy through collaboration with PharmD and provider.    Interventions: 1:1 collaboration with Philip Aspen, Limmie Patricia, MD regarding development and update of comprehensive plan of care as evidenced by provider attestation and co-signature Inter-disciplinary care team collaboration (see longitudinal plan of care) Comprehensive medication review performed; medication list updated in electronic medical record  Hypertension (BP goal <140/90) -Controlled -Current treatment: No medications -Medications previously tried: amlodipine (orthostatic hypotension), lisinopril  -Current home readings: not checking recently (once a week) -Current dietary habits: patient does not like salt in her foods (except with french fries), has had a history of hyponatremia -Current exercise habits: patient tries to walk daily; going to the store with him as well -Denies hypotensive/hypertensive symptoms -Educated on  Daily salt intake goal < 2300 mg; Importance of home blood pressure monitoring; Proper BP monitoring technique; -Counseled to monitor BP at home weekly, document, and provide log at future appointments -Counseled on diet and exercise extensively  Anxiety (Goal: minimize symptoms of anxiety) -Controlled -Current treatment: Gabapentin 100 mg 1 tablet twice  - Appropriate, Effective, Safe, Accessible -Medications previously tried/failed: none -GAD7: n/a -Educated on Benefits of medication for symptom control -Recommended to continue current medication  Osteoporosis (Goal improve bone density and prevent fractures) -Controlled -Last DEXA Scan: 06/07/2019  T-Score femoral neck: -3.3  T-Score total hip: n/a  T-Score lumbar spine: -4.7  T-Score forearm radius: n/a  10-year probability of major osteoporotic fracture: n/a  10-year probability of hip fracture: n/a -Patient is a candidate for pharmacologic treatment due to T-Score < -2.5 in femoral neck and T-Score < -2.5 in lumbar spine -Current treatment  Prolia injection once every 6 months (last July 2022) - Appropriate, Effective, Safe, Accessible Calcium with vitamin D 600 mg-800 units of vitamin - 1 tablet daily - Appropriate, Effective, Safe, Accessible Multivitamin 1 tablet daily (300 mg, 1000 units of vitamin D) - Appropriate, Effective, Safe, Accessible -Medications previously tried: none  -Recommend [phone] units of vitamin D daily. Recommend 1200 mg of calcium daily from dietary and supplemental sources. Recommend weight-bearing and muscle strengthening exercises for building and maintaining bone density. -Recommended to continue current medication Recommended scheduling next dose of Prolia as previous was > 6 months.  Pain (Goal: minimize symptoms of pain) -Controlled -Current treatment  Acetaminophen 500 mg 1 tablet every 6 hours as needed  (taking once daily) -Medications previously tried: none  -Recommended to continue  current medication Counseled on maximum dose of 3,000 mg per day of  Tylenol  Diverticulosis (Goal: minimize symptoms) -Controlled -Current treatment  Pantoprazole 40 mg 1 tablet daily - Appropriate, Effective, Safe, Accessible -Medications previously tried: none  -Recommended to continue current medication Counseled on avoiding greasy foods, foods full of fiber, and other non-pharmacologic  non-pharmacologic management of symptoms such as elevating the head of your bed, avoiding eating 2-3 hours before bed, avoiding triggering foods such as acidic, spicy, or fatty foods, eating smaller meals, and wearing clothes that are loose around the waist    Health Maintenance -Vaccine gaps: shingles -Current therapy:  Multivitamin 1 tablet daily (300 mg, 1000 units of vitamin D) Thiamine 100 mg 1 tablet daily Folic acid 1 mg 1 tablet daily  -Educated on Cost vs benefit of each product must be carefully weighed by individual consumer -Patient is satisfied with current therapy and denies issues -Recommended to continue current medication  Patient Goals/Self-Care Activities Over the next 180 days, patient will:  - take medications as prescribed check blood pressure weekly, document, and provide at future appointments  Follow Up Plan: Telephone follow up appointment with care management team member scheduled for: 6 months       Patient verbalizes understanding of instructions and care plan provided today and agrees to view in MyChart. Active MyChart status confirmed with patient.   The pharmacy team will reach out to the patient again over the next 7 days.   Verner Chol, [facility]

## 2021-09-17 DIAGNOSIS — M810 Age-related osteoporosis without current pathological fracture: Secondary | ICD-10-CM

## 2021-09-17 DIAGNOSIS — I10 Essential (primary) hypertension: Secondary | ICD-10-CM | POA: Diagnosis not present

## 2021-10-14 ENCOUNTER — Other Ambulatory Visit: Payer: Self-pay | Admitting: Internal Medicine

## 2021-10-14 DIAGNOSIS — K5720 Diverticulitis of large intestine with perforation and abscess without bleeding: Secondary | ICD-10-CM

## 2021-10-17 ENCOUNTER — Ambulatory Visit (INDEPENDENT_AMBULATORY_CARE_PROVIDER_SITE_OTHER): Payer: PPO

## 2021-10-17 DIAGNOSIS — M810 Age-related osteoporosis without current pathological fracture: Secondary | ICD-10-CM | POA: Diagnosis not present

## 2021-10-17 MED ORDER — DENOSUMAB 60 MG/ML ~~LOC~~ SOSY
60.0000 mg | PREFILLED_SYRINGE | Freq: Once | SUBCUTANEOUS | Status: AC
  Administered 2021-10-17: 60 mg via SUBCUTANEOUS

## 2021-10-17 NOTE — Progress Notes (Signed)
Prolia injection to right arm tolerated well, per order from Dr. Ardyth Harps ?

## 2021-11-20 ENCOUNTER — Other Ambulatory Visit: Payer: PPO

## 2021-11-20 ENCOUNTER — Telehealth: Payer: Self-pay | Admitting: Pharmacist

## 2021-11-20 NOTE — Chronic Care Management (AMB) (Signed)
Chronic Care Management Pharmacy Assistant   Name: Kelena Garrow  MRN: [id number] DOB: [birth year]  Reason for Encounter: Disease State / Hypertension Assessment Call   Conditions to be addressed/monitored: HTN  Recent office visits:  10/17/2021 Mary Sella CMA - Prolia injection to right arm tolerated well, per order from Dr. Ardyth Harps.  Recent consult visits:  None  Hospital visits:  None  Medications: Outpatient Encounter Medications as of 11/20/2021  Medication Sig Note   acetaminophen (TYLENOL) 500 MG tablet Take 500 mg by mouth every 6 (six) hours as needed.    calcium carbonate (OS-CAL - DOSED IN MG OF ELEMENTAL CALCIUM) 1250 (500 Ca) MG tablet Take 1 tablet by mouth daily.     denosumab (PROLIA) 60 MG/ML SOSY injection Inject 60 mg into the skin every 6 (six) months. 02/15/2020: Last dose:10/2019   folic acid (FOLVITE) 1 MG tablet TAKE 1 TABLET BY MOUTH EVERY DAY    gabapentin (NEURONTIN) 100 MG capsule TAKE 1 CAPSULE BY MOUTH TWICE A DAY    Multiple Vitamin (MULTIVITAMIN WITH MINERALS) TABS tablet Take 1 tablet by mouth daily. Centrum Silver    pantoprazole (PROTONIX) 40 MG tablet TAKE 1 TABLET BY MOUTH EVERY DAY    potassium chloride SA (KLOR-CON) 20 MEQ tablet Take 1 tablet (20 mEq total) by mouth daily for 5 days.    thiamine 100 MG tablet Take 1 tablet (100 mg total) by mouth daily.    No facility-administered encounter medications on file as of 11/20/2021.  Fill History: FOLIC ACID 1 MG TABLET 09/01/2021 90   GABAPENTIN 100 MG CAPSULE 09/03/2021 90   PANTOPRAZOLE 40 MG TABLET, DELAYED RELEASE (ENTERIC COATED) 10/14/2021 90   Reviewed chart prior to disease state call. Spoke with patient regarding BP  Recent Office Vitals: BP Readings from Last 3 Encounters:  05/30/21 110/70  04/10/20 130/80  03/14/20 135/73   Pulse Readings from Last 3 Encounters:  05/30/21 (!) 58  04/10/20 62  03/14/20 64    Wt Readings from Last 3 Encounters:  05/30/21 105 lb  6.4 oz (47.8 kg)  04/10/20 106 lb 3.2 oz (48.2 kg)  03/13/20 108 lb 0.4 oz (49 kg)     Kidney Function Lab Results  Component Value Date/Time   CREATININE 0.62 06/19/2021 02:02 PM   CREATININE 0.63 05/30/2021 08:51 AM   CREATININE 0.58 04/10/2020 08:18 AM   GFR 90.94 06/19/2021 02:02 PM   GFRNONAA >60 03/12/2020 04:57 AM   GFRAA >60 03/12/2020 04:57 AM       Latest Ref Rng & Units 06/19/2021    2:02 PM 05/30/2021    8:51 AM 04/10/2020    8:18 AM  BMP  Glucose 70 - 99 mg/dL 810   81   96    BUN 6 - 23 mg/dL 11   12   17     Creatinine 0.40 - 1.20 mg/dL   1.75   1.02    BUN/Creat Ratio 6 - 22 (calc)   NOT APPLICABLE    Sodium 135 - 145 mEq/L 134   135   130    Potassium 3.5 - 5.1 mEq/L 4.3   3.2   3.9    Chloride 96 - 112 mEq/L 99   96   92    CO2 19 - 32 mEq/L 30   31   28     Calcium 8.4 - 10.5 mg/dL 8.8   8.4   9.4      Current antihypertensive regimen:  Not  currently on any hypertension medications.   How often are you checking your Blood Pressure? Patients husband is checking her blood pressure weekly  Current home BP readings: Patients husband states he doesn't have any logs with him however, her BP is always around 128/70.  What recent interventions/DTPs have been made by any provider to improve Blood Pressure control since last CPP Visit:    Advised to continued monitoring at home at least once a week.  Any recent hospitalizations or ED visits since last visit with CPP? No recent hospital visits.   What diet changes have been made to improve Blood Pressure Control?  Patient doesn't follow any type of diet, she doesn't eat breakfast Brunch - patient will have a sandwich with deli meats Dinner - patient will have a meal containing a meat (fish or chicken) and a vegetable.  What exercise is being done to improve your Blood Pressure Control?  Patient tries to walk daily  Note: Patients husband states she is now up to 114 lbs.   Adherence Review: Is the  patient currently on ACE/ARB medication? No Does the patient have >5 day gap between last estimated fill dates? No  Care Gaps: AWV - previous message sent to Carrie Mew Last BP - 110/70 on 05/30/2021 Last A1C - 5.9 on 05/30/2021 Hep C Screen - never done Shingrix - never done Covid booster - postponed   Star Rating Drug: None  Inetta Fermo [facility]  Clinical Pharmacist Assistant [phone]

## 2021-11-28 ENCOUNTER — Other Ambulatory Visit: Payer: Self-pay | Admitting: Internal Medicine

## 2021-11-28 DIAGNOSIS — F1020 Alcohol dependence, uncomplicated: Secondary | ICD-10-CM

## 2021-12-06 ENCOUNTER — Other Ambulatory Visit: Payer: Self-pay | Admitting: Internal Medicine

## 2021-12-06 DIAGNOSIS — K5720 Diverticulitis of large intestine with perforation and abscess without bleeding: Secondary | ICD-10-CM

## 2022-02-12 ENCOUNTER — Telehealth: Payer: Self-pay

## 2022-02-12 NOTE — Telephone Encounter (Signed)
Last OV 05/30/21 Notes: Instructions   Return in about 6 months (around 11/27/2021).  Husband Leonette Most notified of above & appt scheduled for 02/20/22.

## 2022-02-18 DIAGNOSIS — I8390 Asymptomatic varicose veins of unspecified lower extremity: Secondary | ICD-10-CM | POA: Diagnosis not present

## 2022-02-18 DIAGNOSIS — I10 Essential (primary) hypertension: Secondary | ICD-10-CM | POA: Diagnosis not present

## 2022-02-18 DIAGNOSIS — Z6820 Body mass index (BMI) 20.0-20.9, adult: Secondary | ICD-10-CM | POA: Diagnosis not present

## 2022-02-20 ENCOUNTER — Encounter: Payer: Self-pay | Admitting: Internal Medicine

## 2022-02-20 ENCOUNTER — Ambulatory Visit (INDEPENDENT_AMBULATORY_CARE_PROVIDER_SITE_OTHER): Payer: PPO | Admitting: Internal Medicine

## 2022-02-20 VITALS — BP 125/72 | HR 55 | Temp 97.8°F | Ht 63.0 in | Wt 114.7 lb

## 2022-02-20 DIAGNOSIS — K5790 Diverticulosis of intestine, part unspecified, without perforation or abscess without bleeding: Secondary | ICD-10-CM | POA: Diagnosis not present

## 2022-02-20 DIAGNOSIS — M810 Age-related osteoporosis without current pathological fracture: Secondary | ICD-10-CM

## 2022-02-20 DIAGNOSIS — D508 Other iron deficiency anemias: Secondary | ICD-10-CM | POA: Diagnosis not present

## 2022-02-20 DIAGNOSIS — K5720 Diverticulitis of large intestine with perforation and abscess without bleeding: Secondary | ICD-10-CM | POA: Diagnosis not present

## 2022-02-20 DIAGNOSIS — E559 Vitamin D deficiency, unspecified: Secondary | ICD-10-CM | POA: Diagnosis not present

## 2022-02-20 DIAGNOSIS — F1020 Alcohol dependence, uncomplicated: Secondary | ICD-10-CM | POA: Diagnosis not present

## 2022-02-20 NOTE — Progress Notes (Signed)
Established Patient Office Visit     CC/Reason for Visit: 40-month follow-up chronic medical conditions  HPI: [name] is a 71 y.o. female who is coming in today for the above mentioned reasons. Past Medical History is significant for:  Vitamin D and B12 deficiencies, osteoporosis on Prolia, in 2021 she developed an intra-abdominal abscess secondary to diverticular disease and is status post a sigmoid colectomy.  She is doing well from the standpoint.  She has been doing well and has no acute concerns or complaints.  She has gained 10 pounds since her last visit.  She has been very underweight at that time.   Past Medical/Surgical History: Past Medical History:  Diagnosis Date   Alcoholism (HCC) 02/02/2009   ANXIETY 01/21/2007   Arthritis    Blood transfusion    hx of 2005    Cognitive communication disorder    per husband has some cognitive issues    Depression    Diverticulitis    Diverticulitis of large intestine with abscess 04/05/2019   DIVERTICULOSIS 07/09/2007   Duodenal ulcer hemorrhage perforated 2006   GERD (gastroesophageal reflux disease)    Headache(784.0) 05/31/2007   pt denies at 08/12/11 preop visit    HYPERTENSION 06/23/2007   HYPONATREMIA 02/02/2009   better off diuretic   INSOMNIA 05/31/2007   Iron deficiency anemia    pt denies at 08/12/11 visit    Marginal ulcer, at gastrojejunostomy, resected 08/14/2011. 09/19/2011   NSAID-induced duodenal ulcer    OSTEOPOROSIS 01/21/2007   Raynaud's syndrome 06/23/2007   Recurrent upper respiratory infection (URI)    pt reports slight cold using Nyquil prn at bedtime    Urinary tract infection 11/23/2019   VITAMIN B12 DEFICIENCY 10/02/2009   Zoster 2012   twice - left groin/vagina    Past Surgical History:  Procedure Laterality Date   COLECTOMY N/A 03/09/2020   Procedure: OPEN SIGMOID COLECTOMY WITH RIGID PROCTOSCOPY;  Surgeon: [name], MD;  Location: [facility];  Service: General;  Laterality: N/A;    COLONOSCOPY  06/18/2007   angulated and stenotic sigmoid colon with severe sigmoid diverticulosis   COLONOSCOPY WITH PROPOFOL N/A 01/26/2020   Procedure: COLONOSCOPY WITH PROPOFOL;  Surgeon: Rachael Fee, MD;  Location: [facility];  Service: Endoscopy;  Laterality: N/A;  ultraslim scope   CYSTOSCOPY WITH STENT PLACEMENT Bilateral 03/09/2020   Procedure: CYSTOSCOPY WITH BILATERAL OPEN ENDED CATHETER PLACEMENT;  Surgeon: Crista Elliot, MD;  Location: [facility];  Service: Urology;  Laterality: Bilateral;   EXPLORATORY LAPAROTOMY  08/03/2004   1) duodenal ulcer oversew, pyloroplasty, anterior, posterior and truncal vagotomies   EXPLORATORY LAPAROTOMY  10/20/2004   1) duodenal ulcer oversew  with exclusion2) side-side gastrojejunostomy 3) feeding jejunostomy   IR RADIOLOGIST EVAL & MGMT  04/26/2019   IR RADIOLOGIST EVAL & MGMT  05/10/2019   IR RADIOLOGIST EVAL & MGMT  06/07/2019   IR RADIOLOGIST EVAL & MGMT  12/06/2019   IR RADIOLOGIST EVAL & MGMT  12/22/2019   IR RADIOLOGIST EVAL & MGMT  01/10/2020   IR RADIOLOGIST EVAL & MGMT  01/31/2020   IR SINUS/FIST TUBE CHK-NON GI  04/12/2019   LAPAROSCOPY  08/14/2011   Procedure: LAPAROSCOPY DIAGNOSTIC;  Surgeon: Kandis Cocking, MD;  Location: [facility];  Service: General;  Laterality: N/A;   LAPAROTOMY  08/14/2011   Procedure: EXPLORATORY LAPAROTOMY;  Surgeon: Kandis Cocking, MD;  Location: [facility];  Service: General;  Laterality: N/A;  exploratory laparotomy, lysis of adhesions, revision gastrojejunostomy,  upper endoscopy   OPEN REDUCTION INTERNAL FIXATION (ORIF) DISTAL RADIAL FRACTURE Right 05/01/2016   Procedure: OPEN REDUCTION INTERNAL FIXATION (ORIF) DISTAL RADIAL FRACTURE, right;  Surgeon: Leanora Cover, MD;  Location: [facility];  Service: Orthopedics;  Laterality: Right;   TUBAL LIGATION  1977   UPPER GASTROINTESTINAL ENDOSCOPY  09/16/2007; 12/21/2008; 03/17/2011   2009: Normal 2010: Normal with enteroscopy2012: Gastrojejunal  anastomotic ulcer,  gastric retention    Social History:  reports that she quit smoking about 7 years ago. Her smoking use included cigarettes. She has never used smokeless tobacco. She reports that she does not currently use alcohol after a past usage of about 7.0 standard drinks of alcohol per week. She reports that she does not use drugs.  Allergies: Allergies  Allergen Reactions   Sulfa Antibiotics Anaphylaxis and Rash   Bee Venom Swelling   Dilaudid [Hydromorphone Hcl] Hives   Penicillins Other (See Comments)    Has patient had a PCN reaction causing immediate rash, facial/tongue/throat swelling, SOB or lightheadedness with hypotension: No Has patient had a PCN reaction causing severe rash involving mucus membranes or skin necrosis: No Has patient had a PCN reaction that required hospitalization No Has patient had a PCN reaction occurring within the last 10 years: No If all of the above answers are "NO", then may proceed with Cephalosporin use.    Family History:  Family History  Problem Relation Age of Onset   Stroke Father    Colon cancer Neg Hx    Esophageal cancer Neg Hx    Pancreatic cancer Neg Hx    Liver disease Neg Hx    Colon polyps Neg Hx      Current Outpatient Medications:    acetaminophen (TYLENOL) 500 MG tablet, Take 500 mg by mouth every 6 (six) hours as needed., Disp: , Rfl:    calcium carbonate (OS-CAL - DOSED IN MG OF ELEMENTAL CALCIUM) 1250 (500 Ca) MG tablet, Take 1 tablet by mouth daily. , Disp: , Rfl:    denosumab (PROLIA) 60 MG/ML SOSY injection, Inject 60 mg into the skin every 6 (six) months., Disp: , Rfl:    folic acid (FOLVITE) 1 MG tablet, TAKE 1 TABLET BY MOUTH EVERY DAY, Disp: 90 tablet, Rfl: 1   gabapentin (NEURONTIN) 100 MG capsule, TAKE 1 CAPSULE BY MOUTH TWICE A DAY, Disp: 180 capsule, Rfl: 1   Multiple Vitamin (MULTIVITAMIN WITH MINERALS) TABS tablet, Take 1 tablet by mouth daily. Centrum Silver, Disp: , Rfl:    pantoprazole (PROTONIX) 40 MG tablet, TAKE 1  TABLET BY MOUTH EVERY DAY, Disp: 90 tablet, Rfl: 1   thiamine 100 MG tablet, Take 1 tablet (100 mg total) by mouth daily., Disp: 30 tablet, Rfl: 2   potassium chloride SA (KLOR-CON) 20 MEQ tablet, Take 1 tablet (20 mEq total) by mouth daily for 5 days., Disp: 5 tablet, Rfl: 0  Review of Systems:  Constitutional: Denies fever, chills, diaphoresis, appetite change and fatigue.  HEENT: Denies photophobia, eye pain, redness, hearing loss, ear pain, congestion, sore throat, rhinorrhea, sneezing, mouth sores, trouble swallowing, neck pain, neck stiffness and tinnitus.   Respiratory: Denies SOB, DOE, cough, chest tightness,  and wheezing.   Cardiovascular: Denies chest pain, palpitations and leg swelling.  Gastrointestinal: Denies nausea, vomiting, abdominal pain, diarrhea, constipation, blood in stool and abdominal distention.  Genitourinary: Denies dysuria, urgency, frequency, hematuria, flank pain and difficulty urinating.  Endocrine: Denies: hot or cold intolerance, sweats, changes in hair or nails, polyuria, polydipsia. Musculoskeletal: Denies myalgias,  back pain, joint swelling, arthralgias and gait problem.  Skin: Denies pallor, rash and wound.  Neurological: Denies dizziness, seizures, syncope, weakness, light-headedness, numbness and headaches.  Hematological: Denies adenopathy. Easy bruising, personal or family bleeding history  Psychiatric/Behavioral: Denies suicidal ideation, mood changes, confusion, nervousness, sleep disturbance and agitation    Physical Exam: Vitals:   02/20/22 1354 02/20/22 1438  BP: (!) 152/60 125/72  Pulse: (!) 55   Temp: 97.8 F (36.6 C)   TempSrc: Oral   SpO2: 100%   Weight: 114 lb 11.2 oz (52 kg)   Height: 5\' 3"  (1.6 m)     Body mass index is 20.32 kg/m.   Constitutional: NAD, calm, comfortable Eyes: PERRL, lids and conjunctivae normal ENMT: Mucous membranes are moist.  Respiratory: clear to auscultation bilaterally, no wheezing, no crackles.  Normal respiratory effort. No accessory muscle use.  Cardiovascular: Regular rate and rhythm, no murmurs / rubs / gallops. No extremity edema.  Psychiatric: Normal judgment and insight. Alert and oriented x 3. Normal mood.    Impression and Plan:  Diverticular disease  Vitamin D deficiency  Abscess of sigmoid colon due to diverticulitis  Alcoholism (HCC)  Iron deficiency anemia secondary to inadequate dietary iron intake  Age-related osteoporosis without current pathological fracture  -Blood pressure was a little bit elevated but normalized on subsequent measurement. -She is currently receiving Prolia for her osteoporosis. -She has been taking oral vitamin D supplementation. -She no longer drinks alcohol. -Her BMI has now normalized with her weight gain.  Time spent:30 minutes reviewing chart, interviewing and examining patient and formulating plan of care.     , MD [facility]

## 2022-03-28 ENCOUNTER — Ambulatory Visit (INDEPENDENT_AMBULATORY_CARE_PROVIDER_SITE_OTHER): Payer: PPO

## 2022-03-28 DIAGNOSIS — Z23 Encounter for immunization: Secondary | ICD-10-CM | POA: Diagnosis not present

## 2022-04-11 ENCOUNTER — Telehealth: Payer: Self-pay | Admitting: Pharmacist

## 2022-04-11 NOTE — Chronic Care Management (AMB) (Signed)
    Chronic Care Management Pharmacy Assistant   Name: [name]  MRN: [id number] DOB: 10-27-1950  04/14/2022 APPOINTMENT [name]'s husband was reminded to have all medications, supplements and any blood glucose and blood pressure readings available for review with Jeni Salles, Pharm. D, at her telephone visit on 04/14/2022 at 1:00.  Care Gaps: AWV - 08/30/2021 message sent to [name] Last BP - 125/72 on 02/20/2022 Last A1C - 5.9 on 05/30/2022 Hep C Screen - never done Shingrix - never done Covid booster - overdue  Star Rating Drug: None  Any gaps in medications fill history? None  [place] Pharmacist Assistant [phone]

## 2022-04-14 ENCOUNTER — Ambulatory Visit: Payer: PPO | Admitting: Pharmacist

## 2022-04-14 DIAGNOSIS — M810 Age-related osteoporosis without current pathological fracture: Secondary | ICD-10-CM

## 2022-04-14 DIAGNOSIS — I10 Essential (primary) hypertension: Secondary | ICD-10-CM

## 2022-04-14 NOTE — Progress Notes (Signed)
Chronic Care Management Pharmacy Note  04/14/2022 Name:  [name] MRN:  [id number] DOB:  05/19/1951  Summary: Pt is due for Prolia injection   Recommendations/Changes made from today's visit: -Scheduled next dose of Prolia as previous was > 6 months prior -Recommended separating multivitamin from calcium  supplement for better absorption   Plan: Scheduled next Prolia injection BP assessment in 6 months  Subjective: [name] is an 71 y.o. year old female who is a primary patient of Isaac Bliss, Rayford Halsted, MD.  The CCM team was consulted for assistance with disease management and care coordination needs.    Engaged with patient by telephone for follow up visit in response to provider referral for pharmacy case management and/or care coordination services.   Consent to Services:  The patient was given information about Chronic Care Management services, agreed to services, and gave verbal consent prior to initiation of services.  Please see initial visit note for detailed documentation.   Patient Care Team: Isaac Bliss, Rayford Halsted, MD as PCP - General (Internal Medicine) Gatha Mayer, MD as Consulting Physician (Gastroenterology) Viona Gilmore, [facility] as Pharmacist (Pharmacist)  Recent office visits: 02/20/22 [name], MD: Patient presented for diverticular disease. Influenza vaccines administered.  Follow up in 6 months for CPE.  10/17/2021 Geradine Girt CMA - Prolia injection to right arm tolerated well, per order from Dr. Jerilee Hoh.  Recent consult visits: 06/25/21 Monna Fam (ophthalmology): Patient presented for eye exam. Unable to access notes.  Hospital visits: None in previous 6 months  Objective:  Lab Results  Component Value Date   CREATININE 0.62 06/19/2021   BUN 11 06/19/2021   GFR 90.94 06/19/2021   GFRNONAA >60 03/12/2020   GFRAA >60 03/12/2020   NA 134 (L) 06/19/2021   K 4.3 06/19/2021   CALCIUM 8.8 06/19/2021    CO2 30 06/19/2021    Lab Results  Component Value Date/Time   HGBA1C 5.9 05/30/2021 08:51 AM   HGBA1C 5.4 04/10/2020 08:18 AM   GFR 90.94 06/19/2021 02:02 PM   GFR 90.62 05/30/2021 08:51 AM    Last diabetic Eye exam: No results found for: "HMDIABEYEEXA"  Last diabetic Foot exam: No results found for: "HMDIABFOOTEX"   Lab Results  Component Value Date   CHOL 209 (H) 05/30/2021   HDL 91.40 05/30/2021   LDLCALC 105 (H) 05/30/2021   TRIG 63.0 05/30/2021   CHOLHDL 2 05/30/2021       Latest Ref Rng & Units 05/30/2021    8:51 AM 04/10/2020    8:18 AM 11/26/2019    8:02 AM  Hepatic Function  Total Protein 6.0 - 8.3 g/dL 7.2  6.9  5.2   Albumin 3.5 - 5.2 g/dL 4.6   2.5   AST 0 - 37 U/L $Remo'22  16  18   'JnbSD$ ALT 0 - 35 U/L $Remo'14  11  13   'KfYZE$ Alk Phosphatase 39 - 117 U/L 74   68   Total Bilirubin 0.2 - 1.2 mg/dL 0.7  0.6  0.4     Lab Results  Component Value Date/Time   TSH 1.36 05/30/2021 08:51 AM   TSH 1.75 04/10/2020 08:18 AM       Latest Ref Rng & Units 05/30/2021    8:51 AM 04/10/2020    8:18 AM 03/12/2020    4:57 AM  CBC  WBC 4.0 - 10.5 K/uL 7.1  7.2  10.5   Hemoglobin 12.0 - 15.0 g/dL 13.3  13.5  11.7  Hematocrit 36.0 - 46.0 % 39.8  39.6  33.9   Platelets 150.0 - 400.0 K/uL 241.0  260  282     Lab Results  Component Value Date/Time   VD25OH 47.67 05/30/2021 08:51 AM   VD25OH 38 04/10/2020 08:18 AM   VD25OH 31.43 12/13/2019 01:59 PM    Clinical ASCVD: No  The 10-year ASCVD risk score (Arnett DK, et al., 2019) is: 8.3%   Values used to calculate the score:     Age: 61 years     Sex: Female     Is Non-Hispanic African American: No     Diabetic: No     Tobacco smoker: No     Systolic Blood Pressure: 812 mmHg     Is BP treated: No     HDL Cholesterol: 91.4 mg/dL     Total Cholesterol: 209 mg/dL       05/30/2021    8:07 AM 04/10/2020    7:49 AM 06/03/2019   12:02 PM  Depression screen PHQ 2/9  Decreased Interest 0 0 0  Down, Depressed, Hopeless 0 0 0  PHQ - 2  Score 0 0 0  Altered sleeping 0 0 0  Tired, decreased energy 0 0 0  Change in appetite 0 0 0  Feeling bad or failure about yourself  0 0 0  Trouble concentrating 1 0 0  Moving slowly or fidgety/restless 1 0 0  Suicidal thoughts 0 0 0  PHQ-9 Score 2 0 0  Difficult doing work/chores Not difficult at all Not difficult at all Not difficult at all      Social History   Tobacco Use  Smoking Status Former   Types: Cigarettes   Quit date: 07/21/2014   Years since quitting: 7.7  Smokeless Tobacco Never   BP Readings from Last 3 Encounters:  02/20/22 125/72  05/30/21 110/70  04/10/20 130/80   Pulse Readings from Last 3 Encounters:  02/20/22 (!) 55  05/30/21 (!) 58  04/10/20 62   Wt Readings from Last 3 Encounters:  02/20/22 114 lb 11.2 oz (52 kg)  05/30/21 105 lb 6.4 oz (47.8 kg)  04/10/20 106 lb 3.2 oz (48.2 kg)    Assessment/Interventions: Review of patient past medical history, allergies, medications, health status, including review of consultants reports, laboratory and other test data, was performed as part of comprehensive evaluation and provision of chronic care management services.   SDOH:  (Social Determinants of Health) assessments and interventions performed: Yes  SDOH Screenings   Transportation Needs: No Transportation Needs (04/14/2022)  Depression (PHQ2-9): Low Risk  (05/30/2021)  Financial Resource Strain: Low Risk  (09/17/2020)  Tobacco Use: Medium Risk (02/20/2022)      CCM Care Plan  Allergies  Allergen Reactions   Sulfa Antibiotics Anaphylaxis and Rash   Bee Venom Swelling   Dilaudid [Hydromorphone Hcl] Hives   Penicillins Other (See Comments)    Has patient had a PCN reaction causing immediate rash, facial/tongue/throat swelling, SOB or lightheadedness with hypotension: No Has patient had a PCN reaction causing severe rash involving mucus membranes or skin necrosis: No Has patient had a PCN reaction that required hospitalization No Has patient had a  PCN reaction occurring within the last 10 years: No If all of the above answers are "NO", then may proceed with Cephalosporin use.    Medications Reviewed Today     Reviewed by Isaac Bliss, Rayford Halsted, MD (Physician) on 02/20/22 at 1437  Med List Status: <None>   Medication Order Taking? Sig  Documenting Provider Last Dose Status Informant  acetaminophen (TYLENOL) 500 MG tablet [id number] Yes Take 500 mg by mouth every 6 (six) hours as needed. [provider] Taking Active   calcium carbonate (OS-CAL - DOSED IN MG OF ELEMENTAL CALCIUM) 1250 (500 Ca) MG tablet [id number] Yes Take 1 tablet by mouth daily.  [provider] Taking Active Spouse/Significant Other  denosumab (PROLIA) 60 MG/ML SOSY injection [id number] Yes Inject 60 mg into the skin every 6 (six) months. [provider] Taking Active Spouse/Significant Other           Med Note Jeanie Cooks Feb 15, 2020 12:27 PM) Last FRTM:21/1173  folic acid (FOLVITE) 1 MG tablet [id number] Yes TAKE 1 TABLET BY MOUTH EVERY DAY Isaac Bliss, Rayford Halsted, MD Taking Active   gabapentin (NEURONTIN) 100 MG capsule [id number] Yes TAKE 1 CAPSULE BY MOUTH TWICE A DAY Isaac Bliss, Rayford Halsted, MD Taking Active   Multiple Vitamin (MULTIVITAMIN WITH MINERALS) TABS tablet [id number] Yes Take 1 tablet by mouth daily. Centrum Silver [provider] Taking Active Spouse/Significant Other  pantoprazole (PROTONIX) 40 MG tablet [id number] Yes TAKE 1 TABLET BY MOUTH EVERY DAY Isaac Bliss, Rayford Halsted, MD Taking Active   potassium chloride SA (KLOR-CON) 20 MEQ tablet [id number]  Take 1 tablet (20 mEq total) by mouth daily for 5 days. Isaac Bliss, Rayford Halsted, MD  Expired 06/05/21 2359   thiamine 100 MG tablet [id number] Yes Take 1 tablet (100 mg total) by mouth daily. 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   SHINGLES 08/19/2010   B12 deficiency 10/02/2009   Iron deficiency anemia 07/09/2007   DIVERTICULOSIS 07/09/2007   Essential hypertension 06/23/2007   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   INSOMNIA 05/31/2007   HEADACHE 05/31/2007   Anxiety state 01/21/2007   Alcoholism ([place]) 01/21/2007   Osteoporosis 01/21/2007    Immunization History  Administered Date(s) Administered   Fluad Quad(high Dose 65+) 04/11/2019, 04/10/2020, 05/30/2021, 03/28/2022   Influenza Split 04/08/2011, 04/15/2012   Influenza Whole 05/20/2007, 04/20/2008, 03/26/2010   Influenza,inj,Quad PF,6+ Mos 04/11/2014, 08/31/2015, 05/16/2016   PFIZER(Purple Top)SARS-COV-2 Vaccination 09/03/2019, 09/25/2019, 05/19/2020, 11/28/2020   Pneumococcal Conjugate-13 04/10/2020   Pneumococcal Polysaccharide-23 06/03/2019   Tdap 02/01/2014   Patient's husband reported she is doing well. She gained some weight recently and is eating well. She is eating more "soft foods" such as protein and more carbs.  Conditions to be addressed/monitored:  Hypertension, Osteoporosis and diverticulosis and  pain and anxiety  Conditions addressed this visit: Hypertension, osteoporosis  Care Plan : [place]  Updates made by Viona Gilmore, [place] since 04/14/2022 12:00 AM     Problem: Problem: Hypertension, Osteoporosis and diverticulosis and pain and anxiety      Long-Range Goal: Patient-Specific Goal   Start Date: 09/05/2020  Expected End Date: 09/05/2021  Recent Progress: On track  Priority: High  Note:   Current Barriers:  Unable to independently  monitor therapeutic efficacy  Pharmacist Clinical  Goal(s):  Patient will achieve adherence to monitoring guidelines and medication adherence to achieve therapeutic efficacy through collaboration with PharmD and provider.    Interventions: 1:1 collaboration with Isaac Bliss, Rayford Halsted, MD regarding development and update of comprehensive plan of care as evidenced by provider attestation and co-signature Inter-disciplinary care team collaboration (see longitudinal plan of care) Comprehensive medication review performed; medication list updated in electronic medical record  Hypertension (BP goal <140/90) -Controlled -Current treatment: No medications -Medications previously tried: amlodipine (orthostatic hypotension), lisinopril  -Current home readings: staying in 120s; 126/67 (once a week) -Current dietary habits: patient does not like salt in her foods (except with french fries), has had a history of hyponatremia -Current exercise habits: patient tries to walk daily; going to the store with him as well -Denies hypotensive/hypertensive symptoms -Educated on Daily salt intake goal < 2300 mg; Importance of home blood pressure monitoring; Proper BP monitoring technique; -Counseled to monitor BP at home weekly, document, and provide log at future appointments -Counseled on diet and exercise extensively  Anxiety (Goal: minimize symptoms of anxiety) -Controlled -Current treatment: Gabapentin 100 mg 1 tablet twice  - Appropriate, Effective, Safe, Accessible -Medications previously tried/failed: none -GAD7: n/a -Educated on Benefits of medication for symptom control -Recommended to continue current medication  Osteoporosis (Goal improve bone density and prevent fractures) -Controlled -Last DEXA Scan: 06/07/2019  T-Score femoral neck: -3.3  T-Score total hip: n/a  T-Score lumbar spine: -4.7  T-Score forearm radius: n/a  10-year probability of major osteoporotic fracture:  n/a  10-year probability of hip fracture: n/a -Patient is a candidate for pharmacologic treatment due to T-Score < -2.5 in femoral neck and T-Score < -2.5 in lumbar spine -Current treatment  Prolia injection once every 6 months (last July 2022) - Appropriate, Effective, Safe, Accessible Calcium with vitamin D 600 mg-800 units of vitamin - 1 tablet daily - Appropriate, Effective, Safe, Accessible Multivitamin 1 tablet daily (300 mg, 1000 units of vitamin D) - Appropriate, Effective, Safe, Accessible -Medications previously tried: none  -Recommend [phone] units of vitamin D daily. Recommend 1200 mg of calcium daily from dietary and supplemental sources. Recommend weight-bearing and muscle strengthening exercises for building and maintaining bone density. -Recommended to continue current medication Recommended scheduling next dose of Prolia as previous was > 6 months.  Pain (Goal: minimize symptoms of pain) -Controlled -Current treatment  Acetaminophen 500 mg 1 tablet every 6 hours as needed  (taking once daily) -Medications previously tried: none  -Recommended to continue current medication Counseled on maximum dose of 3,000 mg per day of Tylenol  Diverticulosis (Goal: minimize symptoms) -Controlled -Current treatment  Pantoprazole 40 mg 1 tablet daily - Appropriate, Effective, Safe, Accessible -Medications previously tried: none  -Recommended to continue current medication Counseled on avoiding greasy foods, foods full of fiber, and other non-pharmacologic  non-pharmacologic management of symptoms such as elevating the head of your bed, avoiding eating 2-3 hours before bed, avoiding triggering foods such as acidic, spicy, or fatty foods, eating smaller meals, and wearing clothes that are loose around the waist    Health Maintenance -Vaccine gaps: shingles -Current therapy:  Multivitamin 1 tablet daily (300 mg, 1000 units of vitamin D) Thiamine 100 mg 1 tablet daily Folic acid 1 mg 1  tablet daily  -Educated on Cost vs benefit of each product must be carefully weighed by individual consumer -Patient is satisfied with current therapy and denies issues -Recommended to continue current medication  Patient Goals/Self-Care Activities Over the next 180 days, patient will:  -  take medications as prescribed check blood pressure weekly, document, and provide at future appointments  Follow Up Plan: Telephone follow up appointment with care management team member scheduled for: 6 months      Medication Assistance: None required.  Patient affirms current coverage meets needs.  Compliance/Adherence/Medication fill history: Care Gaps: Shingrix, COVID booster, Hep C screening Last BP - 125/72 on 02/20/2022 Last A1C - 5.9 on 05/30/2022   Star-Rating Drugs: None  Patient's preferred pharmacy is:  [place], [place] - [address] [place] [address] Phone: [phone] Fax: [phone]  [id number] [address] [place] [address] Phone: [phone] Fax: [place], [place] - [facility] [address] [place] [address] Phone: [phone] Fax: [phone]   Uses pill box? No - gives them all once a day except gabapentin Pt endorses 99% compliance -  Hardly ever  We discussed: Benefits of medication synchronization, packaging and delivery as well as enhanced pharmacist oversight with [employer]. Patient decided to: Continue current medication management strategy  Care Plan and Follow Up Patient Decision:  Patient agrees to Care Plan and Follow-up.  Plan: The care management team will reach out to the patient again over the next 180 days.  Jeni Salles, PharmD [facility] Clinical Pharmacist [place] [facility] [place]

## 2022-04-14 NOTE — Patient Instructions (Signed)
Hi [name] and Juanda Crumble,  It was great to get to catch up again! I am glad things are going so well with you.  Don't forget to separate your calcium from you multivitamin as you can only absorb 600 mg of calcium at once.  Please reach out to me if you have any questions or need anything !  Best, Maddie  Jeni Salles, PharmD, [place] Pharmacist [place] at [place]

## 2022-04-16 ENCOUNTER — Other Ambulatory Visit: Payer: Self-pay | Admitting: Internal Medicine

## 2022-04-16 DIAGNOSIS — F1020 Alcohol dependence, uncomplicated: Secondary | ICD-10-CM

## 2022-04-22 ENCOUNTER — Other Ambulatory Visit: Payer: Self-pay | Admitting: Internal Medicine

## 2022-04-22 DIAGNOSIS — K5720 Diverticulitis of large intestine with perforation and abscess without bleeding: Secondary | ICD-10-CM

## 2022-04-23 ENCOUNTER — Ambulatory Visit (INDEPENDENT_AMBULATORY_CARE_PROVIDER_SITE_OTHER): Payer: PPO

## 2022-04-23 DIAGNOSIS — M810 Age-related osteoporosis without current pathological fracture: Secondary | ICD-10-CM | POA: Diagnosis not present

## 2022-04-23 MED ORDER — DENOSUMAB 60 MG/ML ~~LOC~~ SOSY
60.0000 mg | PREFILLED_SYRINGE | Freq: Once | SUBCUTANEOUS | Status: AC
  Administered 2022-04-23: 60 mg via SUBCUTANEOUS

## 2022-04-23 NOTE — Progress Notes (Signed)
[name] is a 71 y.o. female presents to the office today for Prola injection per Dr Ledell Noss orders. Original order: denosumab (PROLIA) injection 60 mg every 57months Prolia 60mg  subcutaneous was administered left arm today. Patient tolerated injection. Patient due for follow up labs/provider appt: No. Patient next injection due: 10/25/22, appt made No  [name]

## 2022-05-06 ENCOUNTER — Ambulatory Visit
Admission: RE | Admit: 2022-05-06 | Discharge: 2022-05-06 | Disposition: A | Discharge status: Home / Self Care | Payer: PPO | Source: Ambulatory Visit | Attending: Internal Medicine | Admitting: Internal Medicine

## 2022-05-06 DIAGNOSIS — E559 Vitamin D deficiency, unspecified: Secondary | ICD-10-CM

## 2022-05-06 DIAGNOSIS — M810 Age-related osteoporosis without current pathological fracture: Secondary | ICD-10-CM | POA: Diagnosis not present

## 2022-05-06 DIAGNOSIS — Z780 Asymptomatic menopausal state: Secondary | ICD-10-CM | POA: Diagnosis not present

## 2022-06-02 ENCOUNTER — Ambulatory Visit (INDEPENDENT_AMBULATORY_CARE_PROVIDER_SITE_OTHER): Payer: PPO | Admitting: Internal Medicine

## 2022-06-02 ENCOUNTER — Encounter: Payer: Self-pay | Admitting: Internal Medicine

## 2022-06-02 VITALS — BP 120/70 | HR 55 | Temp 97.8°F | Ht 62.5 in | Wt 115.7 lb

## 2022-06-02 DIAGNOSIS — E538 Deficiency of other specified B group vitamins: Secondary | ICD-10-CM

## 2022-06-02 DIAGNOSIS — D508 Other iron deficiency anemias: Secondary | ICD-10-CM | POA: Diagnosis not present

## 2022-06-02 DIAGNOSIS — E559 Vitamin D deficiency, unspecified: Secondary | ICD-10-CM

## 2022-06-02 DIAGNOSIS — M810 Age-related osteoporosis without current pathological fracture: Secondary | ICD-10-CM

## 2022-06-02 DIAGNOSIS — Z0000 Encounter for general adult medical examination without abnormal findings: Secondary | ICD-10-CM | POA: Diagnosis not present

## 2022-06-02 DIAGNOSIS — I10 Essential (primary) hypertension: Secondary | ICD-10-CM | POA: Diagnosis not present

## 2022-06-02 LAB — CBC WITH DIFFERENTIAL/PLATELET
Basophils Absolute: 0 10*3/uL (ref 0.0–0.1)
Basophils Relative: 0.4 % (ref 0.0–3.0)
Eosinophils Absolute: 0 10*3/uL (ref 0.0–0.7)
Eosinophils Relative: 0.6 % (ref 0.0–5.0)
HCT: 47.5 % — ABNORMAL HIGH (ref 36.0–46.0)
Hemoglobin: 15.7 g/dL — ABNORMAL HIGH (ref 12.0–15.0)
Lymphocytes Relative: 24 % (ref 12.0–46.0)
Lymphs Abs: 1.8 10*3/uL (ref 0.7–4.0)
MCHC: 33.1 g/dL (ref 30.0–36.0)
MCV: 98 fl (ref 78.0–100.0)
Monocytes Absolute: 0.6 10*3/uL (ref 0.1–1.0)
Monocytes Relative: 8.1 % (ref 3.0–12.0)
Neutro Abs: 4.9 10*3/uL (ref 1.4–7.7)
Neutrophils Relative %: 66.9 % (ref 43.0–77.0)
Platelets: 220 10*3/uL (ref 150.0–400.0)
RBC: 4.84 Mil/uL (ref 3.87–5.11)
RDW: 14.1 % (ref 11.5–15.5)
WBC: 7.3 10*3/uL (ref 4.0–10.5)

## 2022-06-02 LAB — LIPID PANEL
Cholesterol: 194 mg/dL (ref 0–200)
HDL: 70.7 mg/dL (ref >39.00)
LDL Cholesterol: 107 mg/dL — ABNORMAL HIGH (ref 0–99)
NonHDL: 123.4
Total CHOL/HDL Ratio: 3
Triglycerides: 84 mg/dL (ref 0.0–149.0)
VLDL: 16.8 mg/dL (ref 0.0–40.0)

## 2022-06-02 LAB — COMPREHENSIVE METABOLIC PANEL
ALT: 14 U/L (ref 0–35)
AST: 21 U/L (ref 0–37)
Albumin: 4.5 g/dL (ref 3.5–5.2)
Alkaline Phosphatase: 55 U/L (ref 39–117)
BUN: 10 mg/dL (ref 6–23)
CO2: 27 mEq/L (ref 19–32)
Calcium: 8.9 mg/dL (ref 8.4–10.5)
Chloride: 102 mEq/L (ref 96–112)
Creatinine, Ser: 0.68 mg/dL (ref 0.40–1.20)
GFR: 88.34 mL/min (ref >60.00)
Glucose, Bld: 83 mg/dL (ref 70–99)
Potassium: 3.7 mEq/L (ref 3.5–5.1)
Sodium: 138 mEq/L (ref 135–145)
Total Bilirubin: 0.9 mg/dL (ref 0.2–1.2)
Total Protein: 7.1 g/dL (ref 6.0–8.3)

## 2022-06-02 LAB — VITAMIN B12: Vitamin B-12: 261 pg/mL (ref 211–911)

## 2022-06-02 LAB — VITAMIN D 25 HYDROXY (VIT D DEFICIENCY, FRACTURES): VITD: 61.13 ng/mL (ref 30.00–100.00)

## 2022-06-02 NOTE — Progress Notes (Signed)
Established Patient Office Visit     CC/Reason for Visit: Annual preventive exam and subsequent Medicare wellness visit  HPI: [name] is a 71 y.o. female who is coming in today for the above mentioned reasons. Past Medical History is significant for: Osteoporosis on Prolia and vitamin B12 and D deficiencies.  She is feeling well today and has no acute concerns or complaints.  She has routine eye care, no longer sees a dentist as she has full dentures, no hearing difficulty, she walks daily.  She is overdue for COVID and shingles vaccinations.  All cancer screening is up-to-date.   Past Medical/Surgical History: Past Medical History:  Diagnosis Date   Alcoholism (HCC) 02/02/2009   ANXIETY 01/21/2007   Arthritis    Blood transfusion    hx of 2005    Cognitive communication disorder    per husband has some cognitive issues    Depression    Diverticulitis    Diverticulitis of large intestine with abscess 04/05/2019   DIVERTICULOSIS 07/09/2007   Duodenal ulcer hemorrhage perforated 2006   GERD (gastroesophageal reflux disease)    Headache(784.0) 05/31/2007   pt denies at 08/12/11 preop visit    HYPERTENSION 06/23/2007   HYPONATREMIA 02/02/2009   better off diuretic   INSOMNIA 05/31/2007   Iron deficiency anemia    pt denies at 08/12/11 visit    Marginal ulcer, at gastrojejunostomy, resected 08/14/2011. 09/19/2011   NSAID-induced duodenal ulcer    OSTEOPOROSIS 01/21/2007   Raynaud's syndrome 06/23/2007   Recurrent upper respiratory infection (URI)    pt reports slight cold using Nyquil prn at bedtime    Urinary tract infection 11/23/2019   VITAMIN B12 DEFICIENCY 10/02/2009   Zoster 2012   twice - left groin/vagina    Past Surgical History:  Procedure Laterality Date   COLECTOMY N/A 03/09/2020   Procedure: OPEN SIGMOID COLECTOMY WITH RIGID PROCTOSCOPY;  Surgeon: [name], MD;  Location: [facility];  Service: General;  Laterality: N/A;   COLONOSCOPY  06/18/2007    angulated and stenotic sigmoid colon with severe sigmoid diverticulosis   COLONOSCOPY WITH PROPOFOL N/A 01/26/2020   Procedure: COLONOSCOPY WITH PROPOFOL;  Surgeon: Rachael Fee, MD;  [facility];  Service: Endoscopy;  Laterality: N/A;  ultraslim scope   CYSTOSCOPY WITH STENT PLACEMENT Bilateral 03/09/2020   Procedure: CYSTOSCOPY WITH BILATERAL OPEN ENDED CATHETER PLACEMENT;  Surgeon: Crista Elliot, MD;  Location: [facility];  Service: Urology;  Laterality: Bilateral;   EXPLORATORY LAPAROTOMY  08/03/2004   1) duodenal ulcer oversew, pyloroplasty, anterior, posterior and truncal vagotomies   EXPLORATORY LAPAROTOMY  10/20/2004   1) duodenal ulcer oversew  with exclusion2) side-side gastrojejunostomy 3) feeding jejunostomy   IR RADIOLOGIST EVAL & MGMT  04/26/2019   IR RADIOLOGIST EVAL & MGMT  05/10/2019   IR RADIOLOGIST EVAL & MGMT  06/07/2019   IR RADIOLOGIST EVAL & MGMT  12/06/2019   IR RADIOLOGIST EVAL & MGMT  12/22/2019   IR RADIOLOGIST EVAL & MGMT  01/10/2020   IR RADIOLOGIST EVAL & MGMT  01/31/2020   IR SINUS/FIST TUBE CHK-NON GI  04/12/2019   LAPAROSCOPY  08/14/2011   Procedure: LAPAROSCOPY DIAGNOSTIC;  Surgeon: Kandis Cocking, MD;  [facility];  Service: General;  Laterality: N/A;   LAPAROTOMY  08/14/2011   Procedure: EXPLORATORY LAPAROTOMY;  Surgeon: Kandis Cocking, MD;  [facility];  Service: General;  Laterality: N/A;  exploratory laparotomy, lysis of adhesions, revision gastrojejunostomy, upper endoscopy   OPEN  REDUCTION INTERNAL FIXATION (ORIF) DISTAL RADIAL FRACTURE Right 05/01/2016   Procedure: OPEN REDUCTION INTERNAL FIXATION (ORIF) DISTAL RADIAL FRACTURE, right;  Surgeon: Betha Loa, MD;  Location: [facility];  Service: Orthopedics;  Laterality: Right;   TUBAL LIGATION  1977   UPPER GASTROINTESTINAL ENDOSCOPY  09/16/2007; 12/21/2008; 03/17/2011   2009: Normal 2010: Normal with enteroscopy2012: Gastrojejunal  anastomotic ulcer, gastric retention     Social History:  reports that she quit smoking about 7 years ago. Her smoking use included cigarettes. She has never used smokeless tobacco. She reports that she does not currently use alcohol after a past usage of about 7.0 standard drinks of alcohol per week. She reports that she does not use drugs.  Allergies: Allergies  Allergen Reactions   Sulfa Antibiotics Anaphylaxis and Rash   Bee Venom Swelling   Dilaudid [Hydromorphone Hcl] Hives   Penicillins Other (See Comments)    Has patient had a PCN reaction causing immediate rash, facial/tongue/throat swelling, SOB or lightheadedness with hypotension: No Has patient had a PCN reaction causing severe rash involving mucus membranes or skin necrosis: No Has patient had a PCN reaction that required hospitalization No Has patient had a PCN reaction occurring within the last 10 years: No If all of the above answers are "NO", then may proceed with Cephalosporin use.    Family History:  Family History  Problem Relation Age of Onset   Stroke Father    Colon cancer Neg Hx    Esophageal cancer Neg Hx    Pancreatic cancer Neg Hx    Liver disease Neg Hx    Colon polyps Neg Hx      Current Outpatient Medications:    acetaminophen (TYLENOL) 500 MG tablet, Take 500 mg by mouth every 6 (six) hours as needed., Disp: , Rfl:    calcium carbonate (OS-CAL - DOSED IN MG OF ELEMENTAL CALCIUM) 1250 (500 Ca) MG tablet, Take 1 tablet by mouth daily. 600 mg with vitamin D 800 units, Disp: , Rfl:    denosumab (PROLIA) 60 MG/ML SOSY injection, Inject 60 mg into the skin every 6 (six) months., Disp: , Rfl:    folic acid (FOLVITE) 1 MG tablet, TAKE 1 TABLET BY MOUTH EVERY DAY, Disp: 90 tablet, Rfl: 1   gabapentin (NEURONTIN) 100 MG capsule, TAKE 1 CAPSULE BY MOUTH TWICE A DAY, Disp: 180 capsule, Rfl: 1   Multiple Vitamin (MULTIVITAMIN WITH MINERALS) TABS tablet, Take 1 tablet by mouth daily. Centrum Silver - 300 mg of calcium, Disp: , Rfl:    pantoprazole  (PROTONIX) 40 MG tablet, TAKE 1 TABLET BY MOUTH EVERY DAY, Disp: 90 tablet, Rfl: 0   thiamine 100 MG tablet, Take 1 tablet (100 mg total) by mouth daily., Disp: 30 tablet, Rfl: 2   potassium chloride SA (KLOR-CON) 20 MEQ tablet, Take 1 tablet (20 mEq total) by mouth daily for 5 days., Disp: 5 tablet, Rfl: 0  Review of Systems:  Constitutional: Denies fever, chills, diaphoresis, appetite change and fatigue.  HEENT: Denies photophobia, eye pain, redness, hearing loss, ear pain, congestion, sore throat, rhinorrhea, sneezing, mouth sores, trouble swallowing, neck pain, neck stiffness and tinnitus.   Respiratory: Denies SOB, DOE, cough, chest tightness,  and wheezing.   Cardiovascular: Denies chest pain, palpitations and leg swelling.  Gastrointestinal: Denies nausea, vomiting, abdominal pain, diarrhea, constipation, blood in stool and abdominal distention.  Genitourinary: Denies dysuria, urgency, frequency, hematuria, flank pain and difficulty urinating.  Endocrine: Denies: hot or cold intolerance, sweats, changes in hair or  nails, polyuria, polydipsia. Musculoskeletal: Denies myalgias, back pain, joint swelling, arthralgias and gait problem.  Skin: Denies pallor, rash and wound.  Neurological: Denies dizziness, seizures, syncope, weakness, light-headedness, numbness and headaches.  Hematological: Denies adenopathy. Easy bruising, personal or family bleeding history  Psychiatric/Behavioral: Denies suicidal ideation, mood changes, confusion, nervousness, sleep disturbance and agitation    Physical Exam: Vitals:   06/02/22 0904  BP: 120/70  Pulse: (!) 55  Temp: 97.8 F (36.6 C)  TempSrc: Oral  Weight: 115 lb 11.2 oz (52.5 kg)  Height: 5' 2.5" (1.588 m)    Body mass index is 20.82 kg/m.   Constitutional: NAD, calm, comfortable Eyes: PERRL, lids and conjunctivae normal, wears corrective lenses ENMT: Mucous membranes are moist. Posterior pharynx clear of any exudate or lesions. Normal  dentition. Tympanic membrane is pearly white, no erythema or bulging. Neck: normal, supple, no masses, no thyromegaly Respiratory: clear to auscultation bilaterally, no wheezing, no crackles. Normal respiratory effort. No accessory muscle use.  Cardiovascular: Regular rate and rhythm, no murmurs / rubs / gallops. No extremity edema. 2+ pedal pulses. No carotid bruits.  Abdomen: no tenderness, no masses palpated. No hepatosplenomegaly. Bowel sounds positive.  Musculoskeletal: no clubbing / cyanosis. No joint deformity upper and lower extremities. Good ROM, no contractures. Normal muscle tone.  Skin: no rashes, lesions, ulcers. No induration Neurologic: CN 2-12 grossly intact. Sensation intact, DTR normal. Strength 5/5 in all 4.  Psychiatric: Normal judgment and insight. Alert and oriented x 3. Normal mood.    Subsequent Medicare wellness visit   1. Risk factors, based on past  M,S,F -cardiovascular disease risk factors include age only   2.  Physical activities: Walks daily   3.  Depression/mood: Stable, not depressed   4.  Hearing: No perceived issues   5.  ADL's: Independent in all ADLs   6.  Fall risk: Low fall risk   7.  Home safety: No problems identified   8.  Height weight, and visual acuity: height and weight as above, vision:  Vision Screening   Right eye Left eye Both eyes  Without correction     With correction 20/40 20/40 20/40      9.  Counseling: Advised to update immunizations   10. Lab orders based on risk factors: Laboratory update will be reviewed   11. Referral : None today   12. Care plan: Follow-up with me in 1 year   13. Cognitive assessment: Mild cognitive impairment   14. Screening: Patient provided with a written and personalized 5-10 year screening schedule in the AVS. yes   15. Provider List Update: PCP only  16. Advance Directives: Full code   17. Opioids: Patient is not on any opioid prescriptions and has no risk factors for a substance  use disorder.   Flowsheet Row Office Visit from 06/02/2022 in [facility] [place]  PHQ-9 Total Score 0          03/13/2020    8:00 AM 03/13/2020    7:50 PM 04/10/2020    7:49 AM 05/30/2021    8:09 AM 06/02/2022    9:06 AM  Fall Risk  Falls in the past year?   0 0 0  Was there an injury with Fall?   0 0 0  Fall Risk Category Calculator   0 0 0  Fall Risk Category   Low Low Low  Patient Fall Risk Level Moderate fall risk Moderate fall risk   Low fall risk  Patient at Risk for Falls Due  to     No Fall Risks  Fall risk Follow up     Falls evaluation completed     Impression and Plan:  Encounter for preventive health examination  B12 deficiency - Plan: Vitamin B12  Essential hypertension  Iron deficiency anemia secondary to inadequate dietary iron intake - Plan: CBC with Differential/Platelet, Comprehensive metabolic panel, Lipid panel  Vitamin D deficiency - Plan: VITAMIN D 25 Hydroxy (Vit-D Deficiency, Fractures)  Age-related osteoporosis without current pathological fracture   -Recommend routine eye and dental care. -Immunizations: Advised to update COVID and shingles vaccine at pharmacy. -Healthy lifestyle discussed in detail. -Labs to be updated today. -Colon cancer screening: 01/2020 -Breast cancer screening: 07/2021 -Cervical cancer screening: Declines -Lung cancer screening: Not applicable -Prostate cancer screening: Not applicable -DEXA: 04/2022     Chaya JanEstela Hernandez Acosta, MD [facility]

## 2022-07-19 ENCOUNTER — Other Ambulatory Visit: Payer: Self-pay | Admitting: Internal Medicine

## 2022-07-19 DIAGNOSIS — K5720 Diverticulitis of large intestine with perforation and abscess without bleeding: Secondary | ICD-10-CM

## 2022-07-20 ENCOUNTER — Other Ambulatory Visit: Payer: Self-pay | Admitting: Internal Medicine

## 2022-07-20 DIAGNOSIS — F1020 Alcohol dependence, uncomplicated: Secondary | ICD-10-CM

## 2022-09-24 ENCOUNTER — Telehealth: Payer: Self-pay

## 2022-09-24 NOTE — Progress Notes (Signed)
Care Management & Coordination Services Pharmacy Team  Reason for Encounter: Hypertension  Contacted patient to discuss hypertension disease state. Spoke with  patients husband  on 09/26/2022   Current antihypertensive regimen:  Not currently taking any blood pressure medications Patients husband verbally confirms she does not take any blood pressure medications   How often are you checking your Blood Pressure? Patients husband is checking her blood pressure weekly   she checks her blood pressure at various times  Current home BP readings: Patients husband states her readings are always good around 125/75, he doesn't keep a log.   Wrist or arm cuff: Arm cuff  OTC medications including pseudoephedrine or NSAIDs? Patients husband denies  Any readings above 180/100? Patients husband denies  What recent interventions/DTPs have been made by any provider to improve Blood Pressure control since last CPP Visit: No recent interventions noted  Any recent hospitalizations or ED visits since last visit with CPP? No recent hospital visits  What diet changes have been made to improve Blood Pressure Control?  Patient doesn't follow any type of diet, she doesn't eat breakfast Brunch - patient will have a sandwich with deli meats Dinner - patient will have a meal containing a meat (fish or chicken) and a vegetable. Caffeine intake: diet pepsi Salt intake: lightly salts food (she has a hx of having low sodium in the past)  What exercise is being done to improve your Blood Pressure Control?  Patient walks daily   Adherence Review: Is the patient currently on ACE/ARB medication? No Does the patient have >5 day gap between last estimated fill dates? No  Care Gaps: AWV - completed 06/02/2022 Last BP - 120/70 on 06/02/2022 Last A1C - 5.9 on 05/30/2022 Hep C Screen - never done Shingrix - never done Covid  - overdue Mammogram - overdue   Star Rating Drug: None  Chart Updates: Recent  office visits:  [name] MD - Patient was seen for Encounter for preventive health examination and additional concerns. No medication changes.   Recent consult visits:  None  Hospital visits:  None  Medications: Outpatient Encounter Medications as of 09/24/2022  Medication Sig Note   acetaminophen (TYLENOL) 500 MG tablet Take 500 mg by mouth every 6 (six) hours as needed.    calcium carbonate (OS-CAL - DOSED IN MG OF ELEMENTAL CALCIUM) 1250 (500 Ca) MG tablet Take 1 tablet by mouth daily. 600 mg with vitamin D 800 units    denosumab (PROLIA) 60 MG/ML SOSY injection Inject 60 mg into the skin every 6 (six) months. 02/15/2020: Last [id number]   folic acid (FOLVITE) 1 MG tablet TAKE 1 TABLET BY MOUTH EVERY DAY    gabapentin (NEURONTIN) 100 MG capsule TAKE 1 CAPSULE BY MOUTH TWICE A DAY    Multiple Vitamin (MULTIVITAMIN WITH MINERALS) TABS tablet Take 1 tablet by mouth daily. Centrum Silver - 300 mg of calcium    pantoprazole (PROTONIX) 40 MG tablet TAKE 1 TABLET BY MOUTH EVERY DAY    potassium chloride SA (KLOR-CON) 20 MEQ tablet Take 1 tablet (20 mEq total) by mouth daily for 5 days.    thiamine 100 MG tablet Take 1 tablet (100 mg total) by mouth daily.    No facility-administered encounter medications on file as of 09/24/2022.  Fill History:  Dispensed Days Supply Quantity Provider Pharmacy  FOLIC ACID 1 MG TABLET 07/22/2022 90 90 each      Dispensed Days Supply Quantity Provider Pharmacy  GABAPENTIN 100 MG CAPSULE 03/06/2022 90 180  each      Dispensed Days Supply Quantity Provider Pharmacy  PANTOPRAZOLE SOD DR 40 MG TAB 07/22/2022 90 90 each      Recent Office Vitals: BP Readings from Last 3 Encounters:  06/02/22 120/70  02/20/22 125/72  05/30/21 110/70   Pulse Readings from Last 3 Encounters:  06/02/22 (!) 55  02/20/22 (!) 55  05/30/21 (!) 58    Wt Readings from Last 3 Encounters:  06/02/22 115 lb 11.2 oz (52.5 kg)  02/20/22 114 lb 11.2 oz (52 kg)   05/30/21 105 lb 6.4 oz (47.8 kg)     Kidney Function Lab Results  Component Value Date/Time   CREATININE 0.68 06/02/2022 10:11 AM   CREATININE 0.62 06/19/2021 02:02 PM   CREATININE 0.58 04/10/2020 08:18 AM   GFR 88.34 06/02/2022 10:11 AM   GFRNONAA >60 03/12/2020 04:57 AM   GFRAA >60 03/12/2020 04:57 AM       Latest Ref Rng & Units 06/02/2022   10:11 AM 06/19/2021    2:02 PM 05/30/2021    8:51 AM  BMP  Glucose 70 - 99 mg/dL 83  105  81   BUN 6 - 23 mg/dL 10  11  12    Creatinine 0.40 - 1.20 mg/dL 0.68  0.62  0.63   Sodium 135 - 145 mEq/L 138  134  135   Potassium 3.5 - 5.1 mEq/L 3.7  4.3  3.2   Chloride 96 - 112 mEq/L 102  99  96   CO2 19 - 32 mEq/L 27  30  31    Calcium 8.4 - 10.5 mg/dL 8.9  8.8  8.4    [place] Pharmacist Assistant [phone]

## 2022-09-30 ENCOUNTER — Other Ambulatory Visit: Payer: Self-pay | Admitting: Internal Medicine

## 2022-09-30 DIAGNOSIS — Z1239 Encounter for other screening for malignant neoplasm of breast: Secondary | ICD-10-CM

## 2022-10-24 ENCOUNTER — Ambulatory Visit (INDEPENDENT_AMBULATORY_CARE_PROVIDER_SITE_OTHER): Payer: PPO

## 2022-10-24 DIAGNOSIS — M810 Age-related osteoporosis without current pathological fracture: Secondary | ICD-10-CM | POA: Diagnosis not present

## 2022-10-24 MED ORDER — DENOSUMAB 60 MG/ML ~~LOC~~ SOSY
60.0000 mg | PREFILLED_SYRINGE | Freq: Once | SUBCUTANEOUS | Status: AC
  Administered 2022-10-24: 60 mg via SUBCUTANEOUS

## 2022-10-24 NOTE — Progress Notes (Signed)
Per orders of Dr. Casimiro Needle, injection of Prolia 60mg  given by Vickii Chafe on R arm.  Patient tolerated injection well.

## 2022-11-14 ENCOUNTER — Ambulatory Visit
Admission: RE | Admit: 2022-11-14 | Discharge: 2022-11-14 | Disposition: A | Discharge status: Home / Self Care | Payer: PPO | Source: Ambulatory Visit | Attending: Internal Medicine | Admitting: Internal Medicine

## 2022-11-14 DIAGNOSIS — Z1239 Encounter for other screening for malignant neoplasm of breast: Secondary | ICD-10-CM

## 2022-11-14 DIAGNOSIS — Z1231 Encounter for screening mammogram for malignant neoplasm of breast: Secondary | ICD-10-CM | POA: Diagnosis not present

## 2023-01-17 ENCOUNTER — Other Ambulatory Visit: Payer: Self-pay | Admitting: Internal Medicine

## 2023-01-17 DIAGNOSIS — K5720 Diverticulitis of large intestine with perforation and abscess without bleeding: Secondary | ICD-10-CM

## 2023-01-17 DIAGNOSIS — F1020 Alcohol dependence, uncomplicated: Secondary | ICD-10-CM

## 2023-02-11 ENCOUNTER — Telehealth: Payer: Self-pay

## 2023-02-11 NOTE — Telephone Encounter (Signed)
LVM for patient to call back [phone], or to call PCP office to schedule follow up apt. AS, CMA  

## 2023-03-05 ENCOUNTER — Telehealth: Payer: Self-pay

## 2023-03-05 NOTE — Telephone Encounter (Signed)
LVM for patient to call back [phone], or to call PCP office to schedule follow up apt. AS, CMA  

## 2023-04-12 ENCOUNTER — Other Ambulatory Visit: Payer: Self-pay | Admitting: Internal Medicine

## 2023-04-12 DIAGNOSIS — F1020 Alcohol dependence, uncomplicated: Secondary | ICD-10-CM

## 2023-04-16 ENCOUNTER — Other Ambulatory Visit: Payer: Self-pay | Admitting: Internal Medicine

## 2023-04-16 DIAGNOSIS — K5720 Diverticulitis of large intestine with perforation and abscess without bleeding: Secondary | ICD-10-CM

## 2023-05-01 ENCOUNTER — Telehealth: Payer: Self-pay

## 2023-05-01 NOTE — Telephone Encounter (Signed)
Pt ready for scheduling at anytime.  Estimated out-of-pocket cost due at time of visit: $315  Primary Insurance: Prolia co-insurance: 20% (approximately $315)  Deductible: $304 out of $3,200.  Eligible for co-pay program: No  Prior Auth: Not needed  This summary of benefits is an estimation of the patient's out-of-pocket cost. Exact cost may vary based on individual plan coverage.

## 2023-05-11 ENCOUNTER — Encounter: Payer: Self-pay | Admitting: Internal Medicine

## 2023-05-11 ENCOUNTER — Ambulatory Visit (INDEPENDENT_AMBULATORY_CARE_PROVIDER_SITE_OTHER): Payer: PPO | Admitting: Internal Medicine

## 2023-05-11 VITALS — BP 130/80 | HR 59 | Temp 98.1°F | Wt 122.7 lb

## 2023-05-11 DIAGNOSIS — Z23 Encounter for immunization: Secondary | ICD-10-CM | POA: Diagnosis not present

## 2023-05-11 DIAGNOSIS — K148 Other diseases of tongue: Secondary | ICD-10-CM | POA: Diagnosis not present

## 2023-05-11 NOTE — Progress Notes (Signed)
Established Patient Office Visit     CC/Reason for Visit: White spots on tongue  HPI: [name] is a 72 y.o. female who is coming in today for the above mentioned reasons.  She was sent from her dentist to me to evaluate some white spots on the left lateral aspect of her tongue.  She was referred to an oral surgeon but this person was not within network and so they came to me instead.  This is painless, she is not a current smoker.   Past Medical/Surgical History: Past Medical History:  Diagnosis Date   Alcoholism (HCC) 02/02/2009   ANXIETY 01/21/2007   Arthritis    Blood transfusion    hx of 2005    Cognitive communication disorder    per husband has some cognitive issues    Depression    Diverticulitis    Diverticulitis of large intestine with abscess 04/05/2019   DIVERTICULOSIS 07/09/2007   Duodenal ulcer hemorrhage perforated 2006   GERD (gastroesophageal reflux disease)    Headache(784.0) 05/31/2007   pt denies at 08/12/11 preop visit    HYPERTENSION 06/23/2007   HYPONATREMIA 02/02/2009   better off diuretic   INSOMNIA 05/31/2007   Iron deficiency anemia    pt denies at 08/12/11 visit    Marginal ulcer, at gastrojejunostomy, resected 08/14/2011. 09/19/2011   NSAID-induced duodenal ulcer    OSTEOPOROSIS 01/21/2007   Raynaud's syndrome 06/23/2007   Recurrent upper respiratory infection (URI)    pt reports slight cold using Nyquil prn at bedtime    Urinary tract infection 11/23/2019   VITAMIN B12 DEFICIENCY 10/02/2009   Zoster 2012   twice - left groin/vagina    Past Surgical History:  Procedure Laterality Date   COLECTOMY N/A 03/09/2020   Procedure: OPEN SIGMOID COLECTOMY WITH RIGID PROCTOSCOPY;  Surgeon: [name], MD;  Location: [facility];  Service: General;  Laterality: N/A;   COLONOSCOPY  06/18/2007   angulated and stenotic sigmoid colon with severe sigmoid diverticulosis   COLONOSCOPY WITH PROPOFOL N/A 01/26/2020   Procedure: COLONOSCOPY WITH PROPOFOL;   Surgeon: Rachael Fee, MD;  [facility];  Service: Endoscopy;  Laterality: N/A;  ultraslim scope   CYSTOSCOPY WITH STENT PLACEMENT Bilateral 03/09/2020   Procedure: CYSTOSCOPY WITH BILATERAL OPEN ENDED CATHETER PLACEMENT;  Surgeon: Crista Elliot, MD;  Location: [facility];  Service: Urology;  Laterality: Bilateral;   EXPLORATORY LAPAROTOMY  08/03/2004   1) duodenal ulcer oversew, pyloroplasty, anterior, posterior and truncal vagotomies   EXPLORATORY LAPAROTOMY  10/20/2004   1) duodenal ulcer oversew  with exclusion2) side-side gastrojejunostomy 3) feeding jejunostomy   IR RADIOLOGIST EVAL & MGMT  04/26/2019   IR RADIOLOGIST EVAL & MGMT  05/10/2019   IR RADIOLOGIST EVAL & MGMT  06/07/2019   IR RADIOLOGIST EVAL & MGMT  12/06/2019   IR RADIOLOGIST EVAL & MGMT  12/22/2019   IR RADIOLOGIST EVAL & MGMT  01/10/2020   IR RADIOLOGIST EVAL & MGMT  01/31/2020   IR SINUS/FIST TUBE CHK-NON GI  04/12/2019   LAPAROSCOPY  08/14/2011   Procedure: LAPAROSCOPY DIAGNOSTIC;  Surgeon: Kandis Cocking, MD;  [facility]: [facility];  Service: General;  Laterality: N/A;   LAPAROTOMY  08/14/2011   Procedure: EXPLORATORY LAPAROTOMY;  Surgeon: Kandis Cocking, MD;  Location: [facility];  Service: General;  Laterality: N/A;  exploratory laparotomy, lysis of adhesions, revision gastrojejunostomy, upper endoscopy   OPEN REDUCTION INTERNAL FIXATION (ORIF) DISTAL RADIAL FRACTURE Right 05/01/2016   Procedure: OPEN REDUCTION INTERNAL  FIXATION (ORIF) DISTAL RADIAL FRACTURE, right;  Surgeon: Betha Loa, MD;  Location: [facility];  Service: Orthopedics;  Laterality: Right;   TUBAL LIGATION  1977   UPPER GASTROINTESTINAL ENDOSCOPY  09/16/2007; 12/21/2008; 03/17/2011   2009: Normal 2010: Normal with enteroscopy2012: Gastrojejunal  anastomotic ulcer, gastric retention    Social History:  reports that she quit smoking about 8 years ago. Her smoking use included cigarettes. She has never used smokeless tobacco. She reports that  she does not currently use alcohol after a past usage of about 7.0 standard drinks of alcohol per week. She reports that she does not use drugs.  Allergies: Allergies  Allergen Reactions   Sulfa Antibiotics Anaphylaxis and Rash   Bee Venom Swelling   Dilaudid [Hydromorphone Hcl] Hives   Penicillins Other (See Comments)    Has patient had a PCN reaction causing immediate rash, facial/tongue/throat swelling, SOB or lightheadedness with hypotension: No Has patient had a PCN reaction causing severe rash involving mucus membranes or skin necrosis: No Has patient had a PCN reaction that required hospitalization No Has patient had a PCN reaction occurring within the last 10 years: No If all of the above answers are "NO", then may proceed with Cephalosporin use.    Family History:  Family History  Problem Relation Age of Onset   Breast cancer Mother    Stroke Father    Colon cancer Neg Hx    Esophageal cancer Neg Hx    Pancreatic cancer Neg Hx    Liver disease Neg Hx    Colon polyps Neg Hx      Current Outpatient Medications:    acetaminophen (TYLENOL) 500 MG tablet, Take 500 mg by mouth every 6 (six) hours as needed., Disp: , Rfl:    calcium carbonate (OS-CAL - DOSED IN MG OF ELEMENTAL CALCIUM) 1250 (500 Ca) MG tablet, Take 1 tablet by mouth daily. 600 mg with vitamin D 800 units, Disp: , Rfl:    denosumab (PROLIA) 60 MG/ML SOSY injection, Inject 60 mg into the skin every 6 (six) months., Disp: , Rfl:    folic acid (FOLVITE) 1 MG tablet, TAKE 1 TABLET BY MOUTH EVERY DAY, Disp: 90 tablet, Rfl: 0   gabapentin (NEURONTIN) 100 MG capsule, TAKE 1 CAPSULE BY MOUTH TWICE A DAY, Disp: 180 capsule, Rfl: 1   Multiple Vitamin (MULTIVITAMIN WITH MINERALS) TABS tablet, Take 1 tablet by mouth daily. Centrum Silver - 300 mg of calcium, Disp: , Rfl:    pantoprazole (PROTONIX) 40 MG tablet, TAKE 1 TABLET BY MOUTH EVERY DAY, Disp: 90 tablet, Rfl: 0   thiamine 100 MG tablet, Take 1 tablet (100 mg total)  by mouth daily., Disp: 30 tablet, Rfl: 2  Review of Systems:  Negative unless indicated in HPI.   Physical Exam: Vitals:   05/11/23 1423  BP: 130/80  Pulse: (!) 59  Temp: 98.1 F (36.7 C)  TempSrc: Oral  SpO2: 99%  Weight: 122 lb 11.2 oz (55.7 kg)    Body mass index is 22.08 kg/m.   Physical Exam HENT:     Mouth/Throat:     Lips: Pink.     Mouth: Mucous membranes are moist. No injury or oral lesions.     Tongue: No lesions.     Palate: No mass.     Pharynx: Oropharynx is clear.      Impression and Plan:  Tongue lesion  -Whatever lesion she has previously has resolved.  Husband has looked with me and agrees that previous  lesion is no longer present.  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   Time spent:22 minutes reviewing chart, interviewing and examining patient and formulating plan of care.     Chaya Jan, MD  Primary Care at [facility]

## 2023-05-11 NOTE — Addendum Note (Signed)
Addended by: Kern Reap B on: 05/11/2023 04:14 PM   Modules accepted: Orders

## 2023-07-02 ENCOUNTER — Ambulatory Visit: Payer: PPO | Admitting: Internal Medicine

## 2023-07-02 ENCOUNTER — Encounter: Payer: Self-pay | Admitting: Internal Medicine

## 2023-07-02 VITALS — BP 120/70 | HR 55 | Temp 97.8°F | Wt 131.1 lb

## 2023-07-02 DIAGNOSIS — I10 Essential (primary) hypertension: Secondary | ICD-10-CM

## 2023-07-02 DIAGNOSIS — Z0000 Encounter for general adult medical examination without abnormal findings: Secondary | ICD-10-CM | POA: Diagnosis not present

## 2023-07-02 DIAGNOSIS — E559 Vitamin D deficiency, unspecified: Secondary | ICD-10-CM

## 2023-07-02 DIAGNOSIS — E538 Deficiency of other specified B group vitamins: Secondary | ICD-10-CM | POA: Diagnosis not present

## 2023-07-02 DIAGNOSIS — Z1159 Encounter for screening for other viral diseases: Secondary | ICD-10-CM

## 2023-07-02 DIAGNOSIS — M810 Age-related osteoporosis without current pathological fracture: Secondary | ICD-10-CM

## 2023-07-02 DIAGNOSIS — D508 Other iron deficiency anemias: Secondary | ICD-10-CM | POA: Diagnosis not present

## 2023-07-02 LAB — COMPREHENSIVE METABOLIC PANEL
ALT: 10 U/L (ref 0–35)
AST: 19 U/L (ref 0–37)
Albumin: 4.1 g/dL (ref 3.5–5.2)
Alkaline Phosphatase: 70 U/L (ref 39–117)
BUN: 12 mg/dL (ref 6–23)
CO2: 35 meq/L — ABNORMAL HIGH (ref 19–32)
Calcium: 9.2 mg/dL (ref 8.4–10.5)
Chloride: 100 meq/L (ref 96–112)
Creatinine, Ser: 0.77 mg/dL (ref 0.40–1.20)
GFR: 77.66 mL/min (ref >60.00)
Glucose, Bld: 97 mg/dL (ref 70–99)
Potassium: 4.3 meq/L (ref 3.5–5.1)
Sodium: 141 meq/L (ref 135–145)
Total Bilirubin: 0.5 mg/dL (ref 0.2–1.2)
Total Protein: 6.4 g/dL (ref 6.0–8.3)

## 2023-07-02 LAB — CBC WITH DIFFERENTIAL/PLATELET
Basophils Absolute: 0 10*3/uL (ref 0.0–0.1)
Basophils Relative: 0.6 % (ref 0.0–3.0)
Eosinophils Absolute: 0.1 10*3/uL (ref 0.0–0.7)
Eosinophils Relative: 2.2 % (ref 0.0–5.0)
HCT: 40.5 % (ref 36.0–46.0)
Hemoglobin: 13.9 g/dL (ref 12.0–15.0)
Lymphocytes Relative: 30.2 % (ref 12.0–46.0)
Lymphs Abs: 1.9 10*3/uL (ref 0.7–4.0)
MCHC: 34.2 g/dL (ref 30.0–36.0)
MCV: 98.1 fL (ref 78.0–100.0)
Monocytes Absolute: 0.6 10*3/uL (ref 0.1–1.0)
Monocytes Relative: 9.8 % (ref 3.0–12.0)
Neutro Abs: 3.6 10*3/uL (ref 1.4–7.7)
Neutrophils Relative %: 57.2 % (ref 43.0–77.0)
Platelets: 228 10*3/uL (ref 150.0–400.0)
RBC: 4.13 Mil/uL (ref 3.87–5.11)
RDW: 12.8 % (ref 11.5–15.5)
WBC: 6.3 10*3/uL (ref 4.0–10.5)

## 2023-07-02 LAB — LIPID PANEL
Cholesterol: 196 mg/dL (ref 0–200)
HDL: 43.3 mg/dL (ref >39.00)
LDL Cholesterol: 118 mg/dL — ABNORMAL HIGH (ref 0–99)
NonHDL: 152.64
Total CHOL/HDL Ratio: 5
Triglycerides: 172 mg/dL — ABNORMAL HIGH (ref 0.0–149.0)
VLDL: 34.4 mg/dL (ref 0.0–40.0)

## 2023-07-02 LAB — VITAMIN D 25 HYDROXY (VIT D DEFICIENCY, FRACTURES): VITD: 25.42 ng/mL — ABNORMAL LOW (ref 30.00–100.00)

## 2023-07-02 LAB — TSH: TSH: 2.61 u[IU]/mL (ref 0.35–5.50)

## 2023-07-02 LAB — VITAMIN B12: Vitamin B-12: 341 pg/mL (ref 211–911)

## 2023-07-02 NOTE — Progress Notes (Signed)
Established Patient Office Visit     CC/Reason for Visit: Annual preventive exam and subsequent Medicare wellness visit  HPI: [name] is a 72 y.o. female who is coming in today for the above mentioned reasons. Past Medical History is significant for: Osteoporosis, vitamin D and B12 deficiencies.  She has some chronic memory loss that is thought due to alcoholism.  Is overdue for an eye exam, has routine dental care, all cancer screening is up-to-date.  She is due for COVID, shingles and RSV vaccines.   Past Medical/Surgical History: Past Medical History:  Diagnosis Date   Alcoholism (HCC) 02/02/2009   ANXIETY 01/21/2007   Arthritis    Blood transfusion    hx of 2005    Cognitive communication disorder    per husband has some cognitive issues    Depression    Diverticulitis    Diverticulitis of large intestine with abscess 04/05/2019   DIVERTICULOSIS 07/09/2007   Duodenal ulcer hemorrhage perforated 2006   GERD (gastroesophageal reflux disease)    Headache(784.0) 05/31/2007   pt denies at 08/12/11 preop visit    HYPERTENSION 06/23/2007   HYPONATREMIA 02/02/2009   better off diuretic   INSOMNIA 05/31/2007   Iron deficiency anemia    pt denies at 08/12/11 visit    Marginal ulcer, at gastrojejunostomy, resected 08/14/2011. 09/19/2011   NSAID-induced duodenal ulcer    OSTEOPOROSIS 01/21/2007   Raynaud's syndrome 06/23/2007   Recurrent upper respiratory infection (URI)    pt reports slight cold using Nyquil prn at bedtime    Urinary tract infection 11/23/2019   VITAMIN B12 DEFICIENCY 10/02/2009   Zoster 2012   twice - left groin/vagina    Past Surgical History:  Procedure Laterality Date   COLECTOMY N/A 03/09/2020   Procedure: OPEN SIGMOID COLECTOMY WITH RIGID PROCTOSCOPY;  Surgeon: [name], MD;  Location: [facility];  Service: General;  Laterality: N/A;   COLONOSCOPY  06/18/2007   angulated and stenotic sigmoid colon with severe sigmoid diverticulosis   COLONOSCOPY  WITH PROPOFOL N/A 01/26/2020   Procedure: COLONOSCOPY WITH PROPOFOL;  Surgeon: Rachael Fee, MD;  Location: [facility];  Service: Endoscopy;  Laterality: N/A;  ultraslim scope   CYSTOSCOPY WITH STENT PLACEMENT Bilateral 03/09/2020   Procedure: CYSTOSCOPY WITH BILATERAL OPEN ENDED CATHETER PLACEMENT;  Surgeon: Crista Elliot, MD;  Location: [facility];  Service: Urology;  Laterality: Bilateral;   EXPLORATORY LAPAROTOMY  08/03/2004   1) duodenal ulcer oversew, pyloroplasty, anterior, posterior and truncal vagotomies   EXPLORATORY LAPAROTOMY  10/20/2004   1) duodenal ulcer oversew  with exclusion2) side-side gastrojejunostomy 3) feeding jejunostomy   IR RADIOLOGIST EVAL & MGMT  04/26/2019   IR RADIOLOGIST EVAL & MGMT  05/10/2019   IR RADIOLOGIST EVAL & MGMT  06/07/2019   IR RADIOLOGIST EVAL & MGMT  12/06/2019   IR RADIOLOGIST EVAL & MGMT  12/22/2019   IR RADIOLOGIST EVAL & MGMT  01/10/2020   IR RADIOLOGIST EVAL & MGMT  01/31/2020   IR SINUS/FIST TUBE CHK-NON GI  04/12/2019   LAPAROSCOPY  08/14/2011   Procedure: LAPAROSCOPY DIAGNOSTIC;  Surgeon: Kandis Cocking, MD;  Location: [facility];  Service: General;  Laterality: N/A;   LAPAROTOMY  08/14/2011   Procedure: EXPLORATORY LAPAROTOMY;  Surgeon: Kandis Cocking, MD;  Location: [facility];  Service: General;  Laterality: N/A;  exploratory laparotomy, lysis of adhesions, revision gastrojejunostomy, upper endoscopy   OPEN REDUCTION INTERNAL FIXATION (ORIF) DISTAL RADIAL FRACTURE Right 05/01/2016   Procedure: OPEN REDUCTION  INTERNAL FIXATION (ORIF) DISTAL RADIAL FRACTURE, right;  Surgeon: Betha Loa, MD;  Location: [facility];  Service: Orthopedics;  Laterality: Right;   TUBAL LIGATION  1977   UPPER GASTROINTESTINAL ENDOSCOPY  09/16/2007; 12/21/2008; 03/17/2011   2009: Normal 2010: Normal with enteroscopy2012: Gastrojejunal  anastomotic ulcer, gastric retention    Social History:  reports that she quit smoking about 8 years ago. Her smoking use included  cigarettes. She has never used smokeless tobacco. She reports that she does not currently use alcohol after a past usage of about 7.0 standard drinks of alcohol per week. She reports that she does not use drugs.  Allergies: Allergies  Allergen Reactions   Sulfa Antibiotics Anaphylaxis and Rash   Bee Venom Swelling   Dilaudid [Hydromorphone Hcl] Hives   Penicillins Other (See Comments)    Has patient had a PCN reaction causing immediate rash, facial/tongue/throat swelling, SOB or lightheadedness with hypotension: No Has patient had a PCN reaction causing severe rash involving mucus membranes or skin necrosis: No Has patient had a PCN reaction that required hospitalization No Has patient had a PCN reaction occurring within the last 10 years: No If all of the above answers are "NO", then may proceed with Cephalosporin use.    Family History:  Family History  Problem Relation Age of Onset   Breast cancer Mother    Stroke Father    Colon cancer Neg Hx    Esophageal cancer Neg Hx    Pancreatic cancer Neg Hx    Liver disease Neg Hx    Colon polyps Neg Hx      Current Outpatient Medications:    acetaminophen (TYLENOL) 500 MG tablet, Take 500 mg by mouth every 6 (six) hours as needed., Disp: , Rfl:    calcium carbonate (OS-CAL - DOSED IN MG OF ELEMENTAL CALCIUM) 1250 (500 Ca) MG tablet, Take 1 tablet by mouth daily. 600 mg with vitamin D 800 units, Disp: , Rfl:    denosumab (PROLIA) 60 MG/ML SOSY injection, Inject 60 mg into the skin every 6 (six) months., Disp: , Rfl:    folic acid (FOLVITE) 1 MG tablet, TAKE 1 TABLET BY MOUTH EVERY DAY, Disp: 90 tablet, Rfl: 0   gabapentin (NEURONTIN) 100 MG capsule, TAKE 1 CAPSULE BY MOUTH TWICE A DAY, Disp: 180 capsule, Rfl: 1   Multiple Vitamin (MULTIVITAMIN WITH MINERALS) TABS tablet, Take 1 tablet by mouth daily. Centrum Silver - 300 mg of calcium, Disp: , Rfl:    pantoprazole (PROTONIX) 40 MG tablet, TAKE 1 TABLET BY MOUTH EVERY DAY, Disp: 90  tablet, Rfl: 0   thiamine 100 MG tablet, Take 1 tablet (100 mg total) by mouth daily., Disp: 30 tablet, Rfl: 2  Review of Systems:  Negative unless indicated in HPI.   Physical Exam: Vitals:   07/02/23 1310  BP: 120/70  Pulse: (!) 55  Temp: 97.8 F (36.6 C)  TempSrc: Oral  SpO2: 99%  Weight: 131 lb 1.6 oz (59.5 kg)    Body mass index is 23.6 kg/m.   Physical Exam Vitals reviewed.  Constitutional:      General: She is not in acute distress.    Appearance: Normal appearance. She is not ill-appearing, toxic-appearing or diaphoretic.  HENT:     Head: Normocephalic.     Right Ear: Tympanic membrane, ear canal and external ear normal. There is no impacted cerumen.     Left Ear: Tympanic membrane, ear canal and external ear normal. There is no impacted cerumen.  Nose: Nose normal.     Mouth/Throat:     Mouth: Mucous membranes are moist.     Pharynx: Oropharynx is clear. No oropharyngeal exudate or posterior oropharyngeal erythema.  Eyes:     General: No scleral icterus.       Right eye: No discharge.        Left eye: No discharge.     Conjunctiva/sclera: Conjunctivae normal.     Pupils: Pupils are equal, round, and reactive to light.  Neck:     Vascular: No carotid bruit.  Cardiovascular:     Rate and Rhythm: Normal rate and regular rhythm.     Pulses: Normal pulses.     Heart sounds: Normal heart sounds.  Pulmonary:     Effort: Pulmonary effort is normal. No respiratory distress.     Breath sounds: Normal breath sounds.  Abdominal:     General: Abdomen is flat. Bowel sounds are normal.     Palpations: Abdomen is soft.  Musculoskeletal:        General: Normal range of motion.     Cervical back: Normal range of motion.  Skin:    General: Skin is warm and dry.  Neurological:     General: No focal deficit present.     Mental Status: She is alert and oriented to person, place, and time. Mental status is at baseline.  Psychiatric:        Mood and Affect: Mood  normal.        Behavior: Behavior normal.        Thought Content: Thought content normal.        Judgment: Judgment normal.    Subsequent Medicare wellness visit   1. Risk factors, based on past  M,S,F - Cardiac Risk Factors include: advanced age (>76men, >50 women)   2.  Physical activities: Dietary issues and exercise activities discussed:      3.  Depression/mood:  Flowsheet Row Office Visit from 07/02/2023 in [facility] Total Score 0        4.  ADL's:    07/02/2023    1:03 PM  In your present state of health, do you have any difficulty performing the following activities:  Hearing? 0  Vision? 0  Difficulty concentrating or making decisions? 1  Walking or climbing stairs? 0  Dressing or bathing? 0  Doing errands, shopping? 0  Preparing Food and eating ? N  Using the Toilet? N  In the past six months, have you accidently leaked urine? N  Do you have problems with loss of bowel control? N  Managing your Medications? Y  Managing your Finances? Y  Housekeeping or managing your Housekeeping? Y     5.  Fall risk:     04/10/2020    7:49 AM 05/30/2021    8:09 AM 06/02/2022    9:06 AM 05/11/2023    4:12 PM 07/02/2023    1:06 PM  Fall Risk  Falls in the past year? 0 0 0 0 0  Was there an injury with Fall? 0 0 0 0 0  Fall Risk Category Calculator 0 0 0 0 0  Fall Risk Category (Retired) Low Low Low    (RETIRED) Patient Fall Risk Level   Low fall risk    Patient at Risk for Falls Due to   No Fall Risks    Fall risk Follow up   Falls evaluation completed Falls evaluation completed Falls evaluation completed  6.  Home safety: No problems identified   7.  Height weight, and visual acuity: height and weight as above, vision/hearing: Vision Screening   Right eye Left eye Both eyes  Without correction     With correction 20/40 20/40 20/40      8.  Counseling: Counseling given: Not Answered    9. Lab orders based on risk  factors: Laboratory update will be reviewed   10. Cognitive assessment:        07/02/2023    1:07 PM  6CIT Screen  What Year? 4 points  What month? 0 points  What time? 0 points  Count back from 20 4 points  Months in reverse 4 points  Repeat phrase 10 points  Total Score 22 points     11. Screening: Patient provided with a written and personalized 5-10 year screening schedule in the AVS. Health Maintenance  Topic Date Due   Hepatitis C Screening  Never done   Zoster (Shingles) Vaccine (1 of 2) Never done   COVID-19 Vaccine (5 - 2024-25 season) 03/22/2023   Mammogram  11/14/2023   DTaP/Tdap/Td vaccine (2 - Td or Tdap) 02/02/2024   Medicare Annual Wellness Visit  07/01/2024   Colon Cancer Screening  01/25/2030   Pneumonia Vaccine  Completed   Flu Shot  Completed   DEXA scan (bone density measurement)  Completed   HPV Vaccine  Aged Out    12. Provider List Update: Patient Care Team    Relationship Specialty Notifications Start End  Philip Aspen, Limmie Patricia, MD PCP - General Internal Medicine  04/26/19   Iva Boop, MD Consulting Physician Gastroenterology  08/01/11   Verner Chol, [facility] [place] (Inactive) Pharmacist Pharmacist  07/06/20    Comment: Phone: [phone]     13. Advance Directives: Does Patient Have a Medical Advance Directive?: Yes Type of Advance Directive: Out of facility DNR (pink MOST or yellow form), Living will, Healthcare Power of Attorney Does patient want to make changes to medical advance directive?: No - Patient declined Copy of Healthcare Power of Attorney in Chart?: No - copy requested  14. Opioids: Patient is not on any opioid prescriptions and has no risk factors for a substance use disorder.   15.   Goals      none         I have personally reviewed and noted the following in the patient's chart:   Medical and social history Use of alcohol, tobacco or illicit drugs  Current medications and supplements Functional ability and  status Nutritional status Physical activity Advanced directives List of other physicians Hospitalizations, surgeries, and ER visits in previous 12 months Vitals Screenings to include cognitive, depression, and falls Referrals and appointments  In addition, I have reviewed and discussed with patient certain preventive protocols, quality metrics, and best practice recommendations. A written personalized care plan for preventive services as well as general preventive health recommendations were provided to patient.   Impression and Plan:  Medicare annual wellness visit, subsequent  B12 deficiency -     Vitamin B12; Future  Iron deficiency anemia secondary to inadequate dietary iron intake  Vitamin D deficiency -     VITAMIN D 25 Hydroxy (Vit-D Deficiency, Fractures); Future  Encounter for hepatitis C screening test for low risk patient  Essential hypertension -     CBC with Differential/Platelet; Future -     Comprehensive metabolic panel; Future -     Lipid panel; Future -     TSH; Future  Age-related osteoporosis without current pathological fracture   -Recommend routine eye and dental care. -Healthy lifestyle discussed in detail. -Labs to be updated today. -Prostate cancer screening: N/A Health Maintenance  Topic Date Due   Hepatitis C Screening  Never done   Zoster (Shingles) Vaccine (1 of 2) Never done   COVID-19 Vaccine (5 - 2024-25 season) 03/22/2023   Mammogram  11/14/2023   DTaP/Tdap/Td vaccine (2 - Td or Tdap) 02/02/2024   Medicare Annual Wellness Visit  07/01/2024   Colon Cancer Screening  01/25/2030   Pneumonia Vaccine  Completed   Flu Shot  Completed   DEXA scan (bone density measurement)  Completed   HPV Vaccine  Aged Out        Nidia Grogan Philip Aspen, MD [facility]

## 2023-07-02 NOTE — Patient Instructions (Signed)
-  Nice seeing you today!!  -Lab work today; will notify you once results are available.  -update: RSV, COVID, Shingles vaccines.

## 2023-07-06 ENCOUNTER — Other Ambulatory Visit: Payer: Self-pay | Admitting: Internal Medicine

## 2023-07-06 DIAGNOSIS — E559 Vitamin D deficiency, unspecified: Secondary | ICD-10-CM

## 2023-07-06 MED ORDER — VITAMIN D (ERGOCALCIFEROL) 1.25 MG (50000 UNIT) PO CAPS: 50000.0000 [IU] | ORAL_CAPSULE | ORAL | 0 refills | Status: AC

## 2023-07-07 ENCOUNTER — Other Ambulatory Visit: Payer: Self-pay | Admitting: *Deleted

## 2023-07-07 DIAGNOSIS — E559 Vitamin D deficiency, unspecified: Secondary | ICD-10-CM

## 2023-07-08 ENCOUNTER — Other Ambulatory Visit: Payer: Self-pay | Admitting: Internal Medicine

## 2023-07-08 DIAGNOSIS — F1020 Alcohol dependence, uncomplicated: Secondary | ICD-10-CM

## 2023-09-20 ENCOUNTER — Other Ambulatory Visit: Payer: Self-pay | Admitting: Internal Medicine

## 2023-09-20 DIAGNOSIS — E559 Vitamin D deficiency, unspecified: Secondary | ICD-10-CM

## 2023-09-24 ENCOUNTER — Other Ambulatory Visit: Payer: Self-pay

## 2023-09-24 ENCOUNTER — Encounter (HOSPITAL_COMMUNITY): Payer: Self-pay

## 2023-09-24 ENCOUNTER — Emergency Department (HOSPITAL_COMMUNITY)
Admission: EM | Admit: 2023-09-24 | Discharge: 2023-09-25 | Disposition: A | Discharge status: Home / Self Care | Attending: Emergency Medicine | Admitting: Emergency Medicine

## 2023-09-24 ENCOUNTER — Emergency Department (HOSPITAL_COMMUNITY)

## 2023-09-24 DIAGNOSIS — Z87891 Personal history of nicotine dependence: Secondary | ICD-10-CM | POA: Diagnosis not present

## 2023-09-24 DIAGNOSIS — R112 Nausea with vomiting, unspecified: Secondary | ICD-10-CM | POA: Diagnosis not present

## 2023-09-24 DIAGNOSIS — Z79899 Other long term (current) drug therapy: Secondary | ICD-10-CM | POA: Insufficient documentation

## 2023-09-24 DIAGNOSIS — F0390 Unspecified dementia without behavioral disturbance: Secondary | ICD-10-CM | POA: Insufficient documentation

## 2023-09-24 DIAGNOSIS — K5900 Constipation, unspecified: Secondary | ICD-10-CM | POA: Diagnosis not present

## 2023-09-24 DIAGNOSIS — K449 Diaphragmatic hernia without obstruction or gangrene: Secondary | ICD-10-CM | POA: Diagnosis not present

## 2023-09-24 DIAGNOSIS — K5730 Diverticulosis of large intestine without perforation or abscess without bleeding: Secondary | ICD-10-CM | POA: Diagnosis not present

## 2023-09-24 DIAGNOSIS — I10 Essential (primary) hypertension: Secondary | ICD-10-CM | POA: Diagnosis not present

## 2023-09-24 LAB — URINALYSIS, ROUTINE W REFLEX MICROSCOPIC
Bacteria, UA: NONE SEEN
Bilirubin Urine: NEGATIVE
Glucose, UA: NEGATIVE mg/dL
Ketones, ur: NEGATIVE mg/dL
Leukocytes,Ua: NEGATIVE
Nitrite: NEGATIVE
Protein, ur: NEGATIVE mg/dL
Specific Gravity, Urine: 1.038 — ABNORMAL HIGH (ref 1.005–1.030)
pH: 6 (ref 5.0–8.0)

## 2023-09-24 LAB — COMPREHENSIVE METABOLIC PANEL
ALT: 14 U/L (ref 0–44)
AST: 24 U/L (ref 15–41)
Albumin: 3.8 g/dL (ref 3.5–5.0)
Alkaline Phosphatase: 93 U/L (ref 38–126)
Anion gap: 11 (ref 5–15)
BUN: 10 mg/dL (ref 8–23)
CO2: 25 mmol/L (ref 22–32)
Calcium: 9.2 mg/dL (ref 8.9–10.3)
Chloride: 102 mmol/L (ref 98–111)
Creatinine, Ser: 0.73 mg/dL (ref 0.44–1.00)
GFR, Estimated: >60 mL/min (ref >60)
Glucose, Bld: 120 mg/dL — ABNORMAL HIGH (ref 70–99)
Potassium: 3.8 mmol/L (ref 3.5–5.1)
Sodium: 138 mmol/L (ref 135–145)
Total Bilirubin: 1 mg/dL (ref 0.0–1.2)
Total Protein: 7 g/dL (ref 6.5–8.1)

## 2023-09-24 LAB — LIPASE, BLOOD: Lipase: 22 U/L (ref 11–51)

## 2023-09-24 LAB — CBC
HCT: 41.1 % (ref 36.0–46.0)
Hemoglobin: 14.2 g/dL (ref 12.0–15.0)
MCH: 32.7 pg (ref 26.0–34.0)
MCHC: 34.5 g/dL (ref 30.0–36.0)
MCV: 94.7 fL (ref 80.0–100.0)
Platelets: 244 10*3/uL (ref 150–400)
RBC: 4.34 MIL/uL (ref 3.87–5.11)
RDW: 12.6 % (ref 11.5–15.5)
WBC: 12.1 10*3/uL — ABNORMAL HIGH (ref 4.0–10.5)
nRBC: 0 % (ref 0.0–0.2)

## 2023-09-24 LAB — RESP PANEL BY RT-PCR (RSV, FLU A&B, COVID)  RVPGX2
Influenza A by PCR: NEGATIVE
Influenza B by PCR: NEGATIVE
Resp Syncytial Virus by PCR: NEGATIVE
SARS Coronavirus 2 by RT PCR: NEGATIVE

## 2023-09-24 MED ORDER — SODIUM CHLORIDE 0.9 % IV BOLUS
1000.0000 mL | Freq: Once | INTRAVENOUS | Status: AC
  Administered 2023-09-24: 1000 mL via INTRAVENOUS

## 2023-09-24 MED ORDER — IOHEXOL 350 MG/ML SOLN
75.0000 mL | Freq: Once | INTRAVENOUS | Status: AC | PRN
  Administered 2023-09-24: 75 mL via INTRAVENOUS

## 2023-09-24 MED ORDER — ACETAMINOPHEN 500 MG PO TABS
1000.0000 mg | ORAL_TABLET | Freq: Once | ORAL | Status: AC
  Administered 2023-09-24: 1000 mg via ORAL
  Filled 2023-09-24: qty 2

## 2023-09-24 MED ORDER — ONDANSETRON HCL 4 MG/2ML IJ SOLN
4.0000 mg | Freq: Once | INTRAMUSCULAR | Status: AC
  Administered 2023-09-24: 4 mg via INTRAVENOUS
  Filled 2023-09-24: qty 2

## 2023-09-24 NOTE — ED Notes (Signed)
Patient given water and crackers for PO challenge.

## 2023-09-24 NOTE — ED Notes (Signed)
 Patient back from CT.

## 2023-09-24 NOTE — ED Triage Notes (Signed)
 Pt has hx dementia; pt family states has hx stomach surgery (years ago); yesterday pt ate fries and vomited after; pt nauseated after eating today; denies pain; denies fevers; last BM 2 days ago; pt mentating at baseline per family, has been eating well up until yesterday

## 2023-09-24 NOTE — ED Provider Notes (Signed)
 [place] EMERGENCY DEPARTMENT AT [facility] Provider Note  CSN: [id number] Arrival date & time: 09/24/23 1539  Chief Complaint(s) Emesis  HPI [name] is a 73 y.o. female with past medical history as below, significant for alcohol abuse, diverticulitis, perforated duodenal ulcer, IDA, hypertension, dementia who presents to the ED with complaint of abdominal pain, nausea vomiting  Is here with husband who provides majority of history given patient's underlying dementia.  Reports that yesterday she ate some old [place] fries, she began vomiting afterwards.  Vomited up the [place] fries.  Husband reviewed her vomit, no blood noted to the emesis.  No diarrhea.  She has abdominal pain after eating the [place] fries but has since resolved.  No fevers or chills.  No change in urination.  No chest pain or dyspnea.  No rashes.  She has been compliant with her Protonix.  No recent alcohol  Past Medical History Past Medical History:  Diagnosis Date   Alcoholism (HCC) 02/02/2009   ANXIETY 01/21/2007   Arthritis    Blood transfusion    hx of 2005    Cognitive communication disorder    per husband has some cognitive issues    Depression    Diverticulitis    Diverticulitis of large intestine with abscess 04/05/2019   DIVERTICULOSIS 07/09/2007   Duodenal ulcer hemorrhage perforated 2006   GERD (gastroesophageal reflux disease)    Headache(784.0) 05/31/2007   pt denies at 08/12/11 preop visit    HYPERTENSION 06/23/2007   HYPONATREMIA 02/02/2009   better off diuretic   INSOMNIA 05/31/2007   Iron deficiency anemia    pt denies at 08/12/11 visit    Marginal ulcer, at gastrojejunostomy, resected 08/14/2011. 09/19/2011   NSAID-induced duodenal ulcer    OSTEOPOROSIS 01/21/2007   Raynaud's syndrome 06/23/2007   Recurrent upper respiratory infection (URI)    pt reports slight cold using Nyquil prn at bedtime    Urinary tract infection 11/23/2019   VITAMIN B12 DEFICIENCY 10/02/2009   Zoster 2012    twice - left groin/vagina   Patient Active Problem List   Diagnosis Date Noted   Diverticular disease 03/09/2020   Abscess of sigmoid colon due to diverticulitis 11/23/2019   Long QT interval 11/23/2019   Vitamin D deficiency 06/03/2019   Dementia (HCC) 04/05/2019   Malnutrition of moderate degree 08/23/2017   Hyponatremia 08/20/2017   Anemia 08/20/2017   LUQ abdominal pain near former jejunostomy site 03/04/2011   Chronic LLQ pain 02/04/2011   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 Authorizing Provider  ondansetron (ZOFRAN) 4 MG tablet Take 1 tablet (4 mg total) by mouth every 4 (four) hours as needed for nausea or vomiting. 09/25/23  Yes Tanda Rockers A, DO  sucralfate (CARAFATE) 1 g tablet Take 1 tablet (1 g total) by mouth with breakfast, with lunch, and with evening meal for 7 days. 09/25/23 10/02/23 Yes Sloan Leiter, DO  acetaminophen (TYLENOL) 500 MG tablet Take 500 mg by mouth every 6 (six) hours as needed.    [provider]  calcium carbonate (OS-CAL - DOSED IN MG OF ELEMENTAL CALCIUM)  1250 (500 Ca) MG tablet Take 1 tablet by mouth daily. 600 mg with vitamin D 800 units    [provider]  denosumab (PROLIA) 60 MG/ML SOSY injection Inject 60 mg into the skin every 6 (six) months.    [provider]  folic acid (FOLVITE) 1 MG tablet TAKE 1 TABLET BY MOUTH EVERY DAY 07/08/23   Philip Aspen, Limmie Patricia, MD  gabapentin (NEURONTIN) 100 MG capsule TAKE 1 CAPSULE BY MOUTH TWICE A DAY 12/06/21   Philip Aspen, Limmie Patricia, MD   Multiple Vitamin (MULTIVITAMIN WITH MINERALS) TABS tablet Take 1 tablet by mouth daily. Centrum Silver - 300 mg of calcium    [provider]  pantoprazole (PROTONIX) 40 MG tablet TAKE 1 TABLET BY MOUTH EVERY DAY 04/20/23   Philip Aspen, Limmie Patricia, MD  thiamine 100 MG tablet Take 1 tablet (100 mg total) by mouth daily. 09/19/20   Philip Aspen, Limmie Patricia, MD                                                                                                                                    Past Surgical History Past Surgical History:  Procedure Laterality Date   COLECTOMY N/A 03/09/2020   Procedure: OPEN SIGMOID COLECTOMY WITH RIGID PROCTOSCOPY;  Surgeon: [name], MD;  Location: [facility];  Service: General;  Laterality: N/A;   COLONOSCOPY  06/18/2007   angulated and stenotic sigmoid colon with severe sigmoid diverticulosis   COLONOSCOPY WITH PROPOFOL N/A 01/26/2020   Procedure: COLONOSCOPY WITH PROPOFOL;  Surgeon: Rachael Fee, MD;  Location: [facility];  Service: Endoscopy;  Laterality: N/A;  ultraslim scope   CYSTOSCOPY WITH STENT PLACEMENT Bilateral 03/09/2020   Procedure: CYSTOSCOPY WITH BILATERAL OPEN ENDED CATHETER PLACEMENT;  Surgeon: Crista Elliot, MD;  Location: [facility];  Service: Urology;  Laterality: Bilateral;   EXPLORATORY LAPAROTOMY  08/03/2004   1) duodenal ulcer oversew, pyloroplasty, anterior, posterior and truncal vagotomies   EXPLORATORY LAPAROTOMY  10/20/2004   1) duodenal ulcer oversew  with exclusion2) side-side gastrojejunostomy 3) feeding jejunostomy   IR RADIOLOGIST EVAL & MGMT  04/26/2019   IR RADIOLOGIST EVAL & MGMT  05/10/2019   IR RADIOLOGIST EVAL & MGMT  06/07/2019   IR RADIOLOGIST EVAL & MGMT  12/06/2019   IR RADIOLOGIST EVAL & MGMT  12/22/2019   IR RADIOLOGIST EVAL & MGMT  01/10/2020   IR RADIOLOGIST EVAL & MGMT  01/31/2020   IR SINUS/FIST TUBE CHK-NON GI  04/12/2019   LAPAROSCOPY  08/14/2011   Procedure: LAPAROSCOPY DIAGNOSTIC;  Surgeon: Kandis Cocking, MD;  Location: [facility];  Service: General;  Laterality: N/A;   LAPAROTOMY  08/14/2011   Procedure: EXPLORATORY LAPAROTOMY;  Surgeon: Kandis Cocking, MD;  Location: [facility];  Service: General;  Laterality: N/A;  exploratory laparotomy, lysis of adhesions, revision gastrojejunostomy, upper endoscopy   OPEN REDUCTION INTERNAL FIXATION (ORIF) DISTAL RADIAL FRACTURE Right 05/01/2016  Procedure: OPEN REDUCTION INTERNAL FIXATION (ORIF) DISTAL RADIAL FRACTURE, right;  Surgeon: Betha Loa, MD;  Location: [facility];  Service: Orthopedics;  Laterality: Right;   TUBAL LIGATION  1977   UPPER GASTROINTESTINAL ENDOSCOPY  09/16/2007; 12/21/2008; 03/17/2011   2009: Normal 2010: Normal with enteroscopy2012: Gastrojejunal  anastomotic ulcer, gastric retention   Family History Family History  Problem Relation Age of Onset   Breast cancer Mother    Stroke Father    Colon cancer Neg Hx    Esophageal cancer Neg Hx    Pancreatic cancer Neg Hx    Liver disease Neg Hx    Colon polyps Neg Hx     Social History Social History   Tobacco Use   Smoking status: Former    Current packs/day: 0.00    Types: Cigarettes    Quit date: 07/21/2014    Years since quitting: 9.1   Smokeless tobacco: Never  Vaping Use   Vaping status: Never Used  Substance Use Topics   Alcohol use: Not Currently    Alcohol/week: 7.0 standard drinks of alcohol    Types: 7 Cans of beer per week    Comment: stopped around 2020   Drug use: No   Allergies Sulfa antibiotics, Bee venom, Dilaudid [hydromorphone hcl], and Penicillins  Review of Systems A thorough review of systems was obtained and all systems are negative except as noted in the HPI and PMH.   Physical Exam Vital Signs  I have reviewed the triage vital signs BP (!) 159/82 (BP Location: Left Arm)   Pulse 87   Temp 98.2 F (36.8 C) (Oral)   Resp 16   Wt 59 kg   SpO2 95%   BMI 23.40 kg/m  Physical Exam Vitals and nursing note reviewed.   Constitutional:      General: She is not in acute distress.    Appearance: Normal appearance.  HENT:     Head: Normocephalic and atraumatic.     Right Ear: External ear normal.     Left Ear: External ear normal.     Nose: Nose normal.     Mouth/Throat:     Mouth: Mucous membranes are moist.  Eyes:     General: No scleral icterus.       Right eye: No discharge.        Left eye: No discharge.  Cardiovascular:     Rate and Rhythm: Normal rate and regular rhythm.     Pulses: Normal pulses.     Heart sounds: Normal heart sounds.  Pulmonary:     Effort: Pulmonary effort is normal. No respiratory distress.     Breath sounds: Normal breath sounds. No stridor.  Abdominal:     General: Abdomen is flat. There is no distension.     Palpations: Abdomen is soft.     Tenderness: There is no abdominal tenderness. There is no guarding or rebound.  Musculoskeletal:     Cervical back: No rigidity.     Right lower leg: No edema.     Left lower leg: No edema.  Skin:    General: Skin is warm and dry.     Capillary Refill: Capillary refill takes less than 2 seconds.  Neurological:     Mental Status: She is alert.  Psychiatric:        Mood and Affect: Mood normal.        Behavior: Behavior normal. Behavior is cooperative.     ED Results and Treatments Labs (all labs ordered  are listed, but only abnormal results are displayed) Labs Reviewed  COMPREHENSIVE METABOLIC PANEL - Abnormal; Notable for the following components:      Result Value   Glucose, Bld 120 (*)    All other components within normal limits  CBC - Abnormal; Notable for the following components:   WBC 12.1 (*)    All other components within normal limits  URINALYSIS, ROUTINE W REFLEX MICROSCOPIC - Abnormal; Notable for the following components:   Specific Gravity, Urine 1.038 (*)    Hgb urine dipstick SMALL (*)    All other components within normal limits  RESP PANEL BY RT-PCR (RSV, FLU A&B, COVID)  RVPGX2  LIPASE, BLOOD                                                                                                                           Radiology CT ABDOMEN PELVIS W CONTRAST Result Date: 09/24/2023 CLINICAL DATA:  Abdominal pain, acute, nonlocalized. EXAM: CT ABDOMEN AND PELVIS WITH CONTRAST TECHNIQUE: Multidetector CT imaging of the abdomen and pelvis was performed using the standard protocol following bolus administration of intravenous contrast. RADIATION DOSE REDUCTION: This exam was performed according to the departmental dose-optimization program which includes automated exposure control, adjustment of the mA and/or kV according to patient size and/or use of iterative reconstruction technique. CONTRAST:  75mL OMNIPAQUE IOHEXOL 350 MG/ML SOLN COMPARISON:  CT with IV contrast 12/06/2019, 11/23/2019. FINDINGS: Lower chest: Mild atelectatic change left lower lobe. Lung bases are clear of infiltrates. There is mild cardiomegaly. No pericardial effusion. Calcification right coronary artery. Small hiatal hernia. Postsurgical changes EG junction. Hepatobiliary: A 1 cm low-density lesion is again noted dome of hepatic segment 8. Benign entity given length of stability. The liver is otherwise homogeneous. Slight capsular nodularity noted over portions of the left lobe and may suggest early cirrhosis. The main portal vein is slightly prominent at 15 mm. Gallbladder and bile ducts are unremarkable. Pancreas: No abnormality. Spleen: No abnormality.  No splenomegaly. Adrenals/Urinary Tract: There is no adrenal mass. There is no renal mass. There is a 9 mm Bosniak 1 parapelvic cyst in the lower pole of the left kidney, Hounsfield density is 9. No follow-up imaging is recommended. There is no urinary stone or obstruction.  Bladder is unremarkable. Stomach/Bowel: Old surgical change EG junction, partial gastrectomy with gastrojejunostomy and Billroth 2 procedure. 1.6 cm calcification again noted in the excluded stomach lumen distal to  the anastomosis. Small bowel anastomosis is again seen in the left mid abdomen. Rest of the small bowel is normal caliber. An appendix is not seen. Moderate fecal stasis ascending and proximal transverse colon. Scattered colonic diverticula without diverticulitis. Interval sigmoid colectomy and primary end to end surgical anastomosis is intact. Vascular/Lymphatic: Aortic atherosclerosis. No enlarged abdominal or pelvic lymph nodes. Reproductive: Uterus and bilateral adnexa are unremarkable. Other: No abdominal wall hernia or abnormality. No abdominopelvic ascites. Musculoskeletal: Osteopenia with degenerative changes of the spine. Advanced L5-S1 disc collapse. No acute or  other significant osseous findings. IMPRESSION: 1. No acute CT findings in the abdomen or pelvis. 2. Constipation and diverticulosis. 3. Interval sigmoid colectomy with intact surgical anastomosis. 4. Aortic and coronary artery atherosclerosis. 5. Small hiatal hernia. 6. Slight capsular nodularity over portions of the left lobe of the liver which may suggest early cirrhosis. No splenomegaly. No ascites. 7. Slightly prominent main portal vein at 15 mm. 8. Osteopenia and degenerative change. Aortic Atherosclerosis (ICD10-I70.0). Electronically Signed   By: Almira Bar M.D.   On: 09/24/2023 22:13    Pertinent labs & imaging results that were available during my care of the patient were reviewed by me and considered in my medical decision making (see MDM for details).  Medications Ordered in ED Medications  sodium chloride 0.9 % bolus 1,000 mL (0 mLs Intravenous Stopped 09/25/23 0009)  ondansetron (ZOFRAN) injection 4 mg (4 mg Intravenous Given 09/24/23 1925)  iohexol (OMNIPAQUE) 350 MG/ML injection 75 mL (75 mLs Intravenous Contrast Given 09/24/23 1945)  acetaminophen (TYLENOL) tablet 1,000 mg (1,000 mg Oral Given 09/24/23 2043)                                                                                                                                      Procedures Procedures  (including critical care time)  Medical Decision Making / ED Course    Medical Decision Making:    [name] is a 73 y.o. female with past medical history as below, significant for alcohol abuse, diverticulitis, perforated duodenal ulcer, IDA, hypertension, dementia who presents to the ED with complaint of abdominal pain, nausea vomiting. The complaint involves an extensive differential diagnosis and also carries with it a high risk of complications and morbidity.  Serious etiology was considered. Ddx includes but is not limited to: Differential diagnosis includes but is not exclusive to acute cholecystitis, intrathoracic causes for epigastric abdominal pain, gastritis, duodenitis, pancreatitis, small bowel or large bowel obstruction, abdominal aortic aneurysm, hernia, gastritis, etc.   Complete initial physical exam performed, notably the patient was in no distress, sitting comfortably, abdomen nontender.    Reviewed and confirmed nursing documentation for past medical history, family history, social history.  Vital signs reviewed.    Clinical Course as of 09/25/23 0018  Thu Sep 24, 2023  2022 Temp(!): 101.4 F (38.6 C) Now has a fever, was afebrile on arrival. Will give apap.  [SG]  2216 CT imaging has resulted, no acute intra-abdominal findings; chronic findings include diverticulosis, constipation, atherosclerosis, osteopenia, possible developing cirrhosis [SG]  2248 Feeling somewhat better, will trial po challenge  [SG]  2354 Feeling much better, tolerating PO intake, was able to eat applesauce and take oral medications without difficulty  [SG]  Fri Sep 25, 2023  0017 Bacteria, UA: NONE SEEN No uti [SG]    Clinical Course User Index [SG] Sloan Leiter, DO    Brief summary: 73 year old female with history as above here with  abdominal pain nausea vomiting Abd benign   On recheck she is feeling much better, her labs are stable,  she is tolerant p.o. without difficulty.  Imaging is reassuring repeat abdominal exam is benign.  Increase patient for viral etiology, foodborne illness or enteritis.  Recommend bland diet, add Carafate, Zofran as needed, follow-up with PCP.  Abd pain precautions provided to spouse who is primary caretaker  The patient improved significantly and was discharged in stable condition. Detailed discussions were had with the patient/guardian regarding current findings, and need for close f/u with PCP or on call doctor. The patient/guardian has been instructed to return immediately if the symptoms worsen in any way for re-evaluation. Patient/guardian verbalized understanding and is in agreement with current care plan. All questions answered prior to discharge.               Additional history obtained: -Additional history obtained from spouse -External records from outside source obtained and reviewed including: Chart review including previous notes, labs, imaging, consultation notes including  Home medications, prior labs and imaging, primary care documentation   Lab Tests: -I ordered, reviewed, and interpreted labs.   The pertinent results include:   Labs Reviewed  COMPREHENSIVE METABOLIC PANEL - Abnormal; Notable for the following components:      Result Value   Glucose, Bld 120 (*)    All other components within normal limits  CBC - Abnormal; Notable for the following components:   WBC 12.1 (*)    All other components within normal limits  URINALYSIS, ROUTINE W REFLEX MICROSCOPIC - Abnormal; Notable for the following components:   Specific Gravity, Urine 1.038 (*)    Hgb urine dipstick SMALL (*)    All other components within normal limits  RESP PANEL BY RT-PCR (RSV, FLU A&B, COVID)  RVPGX2  LIPASE, BLOOD    Notable for labs stable  EKG   EKG Interpretation Date/Time:  Thursday September 24 2023 19:26:46 EST Ventricular Rate:  74 PR Interval:    QRS Duration:  92 QT  Interval:  370 QTC Calculation: 411 R Axis:   82  Text Interpretation: Borderline right axis deviation Nonspecific T abnormalities, lateral leads Artifact Confirmed by Tanda Rockers ([id number]) on 09/24/2023 7:44:44 PM         Imaging Studies ordered: I ordered imaging studies including CTAP I independently visualized the following imaging with scope of interpretation limited to determining acute life threatening conditions related to emergency care; findings noted above I independently visualized and interpreted imaging. I agree with the radiologist interpretation   Medicines ordered and prescription drug management: Meds ordered this encounter  Medications   sodium chloride 0.9 % bolus 1,000 mL   ondansetron (ZOFRAN) injection 4 mg   iohexol (OMNIPAQUE) 350 MG/ML injection 75 mL   acetaminophen (TYLENOL) tablet 1,000 mg   sucralfate (CARAFATE) 1 g tablet    Sig: Take 1 tablet (1 g total) by mouth with breakfast, with lunch, and with evening meal for 7 days.    Dispense:  21 tablet    Refill:  0   ondansetron (ZOFRAN) 4 MG tablet    Sig: Take 1 tablet (4 mg total) by mouth every 4 (four) hours as needed for nausea or vomiting.    Dispense:  12 tablet    Refill:  0    -I have reviewed the patients home medicines and have made adjustments as needed   Consultations Obtained: na   Cardiac Monitoring: The patient was maintained on a cardiac monitor.  I  personally viewed and interpreted the cardiac monitored which showed an underlying rhythm of: NSR Continuous pulse oximetry interpreted by myself, 100% on RA.    Social Determinants of Health:  Diagnosis or treatment significantly limited by social determinants of health: former smoker   Reevaluation: After the interventions noted above, I reevaluated the patient and found that they have improved  Co morbidities that complicate the patient evaluation  Past Medical History:  Diagnosis Date   Alcoholism (HCC) 02/02/2009    ANXIETY 01/21/2007   Arthritis    Blood transfusion    hx of 2005    Cognitive communication disorder    per husband has some cognitive issues    Depression    Diverticulitis    Diverticulitis of large intestine with abscess 04/05/2019   DIVERTICULOSIS 07/09/2007   Duodenal ulcer hemorrhage perforated 2006   GERD (gastroesophageal reflux disease)    Headache(784.0) 05/31/2007   pt denies at 08/12/11 preop visit    HYPERTENSION 06/23/2007   HYPONATREMIA 02/02/2009   better off diuretic   INSOMNIA 05/31/2007   Iron deficiency anemia    pt denies at 08/12/11 visit    Marginal ulcer, at gastrojejunostomy, resected 08/14/2011. 09/19/2011   NSAID-induced duodenal ulcer    OSTEOPOROSIS 01/21/2007   Raynaud's syndrome 06/23/2007   Recurrent upper respiratory infection (URI)    pt reports slight cold using Nyquil prn at bedtime    Urinary tract infection 11/23/2019   VITAMIN B12 DEFICIENCY 10/02/2009   Zoster 2012   twice - left groin/vagina      Dispostion: Disposition decision including need for hospitalization was considered, and patient discharged from emergency department.    Final Clinical Impression(s) / ED Diagnoses Final diagnoses:  Nausea and vomiting, unspecified vomiting type        Sloan Leiter, DO 09/25/23 0018

## 2023-09-24 NOTE — ED Notes (Signed)
 Patient transported to CT

## 2023-09-25 MED ORDER — ONDANSETRON HCL 4 MG PO TABS: 4.0000 mg | ORAL_TABLET | ORAL | 0 refills | Status: AC | PRN

## 2023-09-25 MED ORDER — SUCRALFATE 1 G PO TABS: 1.0000 g | ORAL_TABLET | Freq: Three times a day (TID) | ORAL | 0 refills | Status: AC

## 2023-09-25 NOTE — ED Notes (Signed)
 Pt tolerated PO challenge

## 2023-09-25 NOTE — Discharge Instructions (Addendum)
 It was a pleasure caring for you today in the emergency department.  Please return to the emergency department for any worsening or worrisome symptoms.

## 2023-10-03 ENCOUNTER — Other Ambulatory Visit: Payer: Self-pay | Admitting: Internal Medicine

## 2023-10-03 DIAGNOSIS — K5720 Diverticulitis of large intestine with perforation and abscess without bleeding: Secondary | ICD-10-CM

## 2023-10-05 ENCOUNTER — Ambulatory Visit (INDEPENDENT_AMBULATORY_CARE_PROVIDER_SITE_OTHER): Admitting: Internal Medicine

## 2023-10-05 ENCOUNTER — Encounter: Payer: Self-pay | Admitting: Internal Medicine

## 2023-10-05 VITALS — BP 130/80 | HR 62 | Wt 130.0 lb

## 2023-10-05 DIAGNOSIS — K529 Noninfective gastroenteritis and colitis, unspecified: Secondary | ICD-10-CM | POA: Diagnosis not present

## 2023-10-05 DIAGNOSIS — Z09 Encounter for follow-up examination after completed treatment for conditions other than malignant neoplasm: Secondary | ICD-10-CM | POA: Diagnosis not present

## 2023-10-05 NOTE — Progress Notes (Signed)
 Established Patient Office Visit     CC/Reason for Visit: Abdominal pain, nausea and vomiting  HPI: [name] is a 73 y.o. female who is coming in today for the above mentioned reasons.  Last week she had abrupt onset of vomiting after eating [place] fries.  Because of her history of diverticulitis with colectomy husband decided to take her to the ER.  CT scan abdomen and pelvis was without acute changes.  She was able to tolerate applesauce in the ED and discharged home.  Ever since her discharge she has been slowly increasing her diet and seems to be doing well.   Past Medical/Surgical History: Past Medical History:  Diagnosis Date   Alcoholism (HCC) 02/02/2009   ANXIETY 01/21/2007   Arthritis    Blood transfusion    hx of 2005    Cognitive communication disorder    per husband has some cognitive issues    Depression    Diverticulitis    Diverticulitis of large intestine with abscess 04/05/2019   DIVERTICULOSIS 07/09/2007   Duodenal ulcer hemorrhage perforated 2006   GERD (gastroesophageal reflux disease)    Headache(784.0) 05/31/2007   pt denies at 08/12/11 preop visit    HYPERTENSION 06/23/2007   HYPONATREMIA 02/02/2009   better off diuretic   INSOMNIA 05/31/2007   Iron deficiency anemia    pt denies at 08/12/11 visit    Marginal ulcer, at gastrojejunostomy, resected 08/14/2011. 09/19/2011   NSAID-induced duodenal ulcer    OSTEOPOROSIS 01/21/2007   Raynaud's syndrome 06/23/2007   Recurrent upper respiratory infection (URI)    pt reports slight cold using Nyquil prn at bedtime    Urinary tract infection 11/23/2019   VITAMIN B12 DEFICIENCY 10/02/2009   Zoster 2012   twice - left groin/vagina    Past Surgical History:  Procedure Laterality Date   COLECTOMY N/A 03/09/2020   Procedure: OPEN SIGMOID COLECTOMY WITH RIGID PROCTOSCOPY;  Surgeon: [name], MD;  Location: [facility];  Service: General;  Laterality: N/A;   COLONOSCOPY  06/18/2007   angulated and stenotic  sigmoid colon with severe sigmoid diverticulosis   COLONOSCOPY WITH PROPOFOL N/A 01/26/2020   Procedure: COLONOSCOPY WITH PROPOFOL;  Surgeon: Rachael Fee, MD;  Location: [facility];  Service: Endoscopy;  Laterality: N/A;  ultraslim scope   CYSTOSCOPY WITH STENT PLACEMENT Bilateral 03/09/2020   Procedure: CYSTOSCOPY WITH BILATERAL OPEN ENDED CATHETER PLACEMENT;  Surgeon: Crista Elliot, MD;  Location: [facility];  Service: Urology;  Laterality: Bilateral;   EXPLORATORY LAPAROTOMY  08/03/2004   1) duodenal ulcer oversew, pyloroplasty, anterior, posterior and truncal vagotomies   EXPLORATORY LAPAROTOMY  10/20/2004   1) duodenal ulcer oversew  with exclusion2) side-side gastrojejunostomy 3) feeding jejunostomy   IR RADIOLOGIST EVAL & MGMT  04/26/2019   IR RADIOLOGIST EVAL & MGMT  05/10/2019   IR RADIOLOGIST EVAL & MGMT  06/07/2019   IR RADIOLOGIST EVAL & MGMT  12/06/2019   IR RADIOLOGIST EVAL & MGMT  12/22/2019   IR RADIOLOGIST EVAL & MGMT  01/10/2020   IR RADIOLOGIST EVAL & MGMT  01/31/2020   IR SINUS/FIST TUBE CHK-NON GI  04/12/2019   LAPAROSCOPY  08/14/2011   Procedure: LAPAROSCOPY DIAGNOSTIC;  Surgeon: Kandis Cocking, MD;  Location: [facility];  Service: General;  Laterality: N/A;   LAPAROTOMY  08/14/2011   Procedure: EXPLORATORY LAPAROTOMY;  Surgeon: Kandis Cocking, MD;  Location: [facility];  Service: General;  Laterality: N/A;  exploratory laparotomy, lysis of adhesions, revision gastrojejunostomy, upper endoscopy  OPEN REDUCTION INTERNAL FIXATION (ORIF) DISTAL RADIAL FRACTURE Right 05/01/2016   Procedure: OPEN REDUCTION INTERNAL FIXATION (ORIF) DISTAL RADIAL FRACTURE, right;  Surgeon: Betha Loa, MD;  Location: [facility];  Service: Orthopedics;  Laterality: Right;   TUBAL LIGATION  1977   UPPER GASTROINTESTINAL ENDOSCOPY  09/16/2007; 12/21/2008; 03/17/2011   2009: Normal 2010: Normal with enteroscopy2012: Gastrojejunal  anastomotic ulcer, gastric retention    Social History:  reports  that she quit smoking about 9 years ago. Her smoking use included cigarettes. She has never used smokeless tobacco. She reports that she does not currently use alcohol after a past usage of about 7.0 standard drinks of alcohol per week. She reports that she does not use drugs.  Allergies: Allergies  Allergen Reactions   Sulfa Antibiotics Anaphylaxis and Rash   Bee Venom Swelling   Dilaudid [Hydromorphone Hcl] Hives   Penicillins Other (See Comments)    Has patient had a PCN reaction causing immediate rash, facial/tongue/throat swelling, SOB or lightheadedness with hypotension: No Has patient had a PCN reaction causing severe rash involving mucus membranes or skin necrosis: No Has patient had a PCN reaction that required hospitalization No Has patient had a PCN reaction occurring within the last 10 years: No If all of the above answers are "NO", then may proceed with Cephalosporin use.    Family History:  Family History  Problem Relation Age of Onset   Breast cancer Mother    Stroke Father    Colon cancer Neg Hx    Esophageal cancer Neg Hx    Pancreatic cancer Neg Hx    Liver disease Neg Hx    Colon polyps Neg Hx      Current Outpatient Medications:    acetaminophen (TYLENOL) 500 MG tablet, Take 500 mg by mouth every 6 (six) hours as needed., Disp: , Rfl:    calcium carbonate (OS-CAL - DOSED IN MG OF ELEMENTAL CALCIUM) 1250 (500 Ca) MG tablet, Take 1 tablet by mouth daily. 600 mg with vitamin D 800 units, Disp: , Rfl:    denosumab (PROLIA) 60 MG/ML SOSY injection, Inject 60 mg into the skin every 6 (six) months., Disp: , Rfl:    folic acid (FOLVITE) 1 MG tablet, TAKE 1 TABLET BY MOUTH EVERY DAY, Disp: 90 tablet, Rfl: 1   gabapentin (NEURONTIN) 100 MG capsule, TAKE 1 CAPSULE BY MOUTH TWICE A DAY, Disp: 180 capsule, Rfl: 1   Multiple Vitamin (MULTIVITAMIN WITH MINERALS) TABS tablet, Take 1 tablet by mouth daily. Centrum Silver - 300 mg of calcium, Disp: , Rfl:    ondansetron (ZOFRAN)  4 MG tablet, Take 1 tablet (4 mg total) by mouth every 4 (four) hours as needed for nausea or vomiting., Disp: 12 tablet, Rfl: 0   pantoprazole (PROTONIX) 40 MG tablet, TAKE 1 TABLET BY MOUTH EVERY DAY, Disp: 90 tablet, Rfl: 0   thiamine 100 MG tablet, Take 1 tablet (100 mg total) by mouth daily., Disp: 30 tablet, Rfl: 2   sucralfate (CARAFATE) 1 g tablet, Take 1 tablet (1 g total) by mouth with breakfast, with lunch, and with evening meal for 7 days., Disp: 21 tablet, Rfl: 0  Review of Systems:  Negative unless indicated in HPI.   Physical Exam: Vitals:   10/05/23 0830  BP: 130/80  Pulse: 62  SpO2: 98%  Weight: 130 lb (59 kg)    Body mass index is 23.4 kg/m.   Physical Exam Vitals reviewed.  Constitutional:      Appearance: Normal appearance.  HENT:     Head: Normocephalic and atraumatic.  Eyes:     Conjunctiva/sclera: Conjunctivae normal.  Cardiovascular:     Rate and Rhythm: Normal rate and regular rhythm.  Pulmonary:     Effort: Pulmonary effort is normal.     Breath sounds: Normal breath sounds.  Abdominal:     General: Abdomen is flat. Bowel sounds are normal. There is no distension.     Palpations: Abdomen is soft.     Tenderness: There is no abdominal tenderness.  Skin:    General: Skin is warm and dry.  Neurological:     General: No focal deficit present.     Mental Status: She is alert and oriented to person, place, and time.  Psychiatric:        Mood and Affect: Mood normal.        Behavior: Behavior normal.        Thought Content: Thought content normal.        Judgment: Judgment normal.      Impression and Plan:  Hospital discharge follow-up  Gastroenteritis   -ED charts reviewed.  Normal CT abdomen with contrast is very reassuring.  Suspect this is a mild gastroenteritis as she is already improving.  Continue to increase diet as tolerated.  Time spent:31 minutes reviewing chart, interviewing and examining patient and formulating plan of  care.     Chaya Jan, MD [facility]

## 2023-10-09 ENCOUNTER — Other Ambulatory Visit (INDEPENDENT_AMBULATORY_CARE_PROVIDER_SITE_OTHER): Payer: PPO

## 2023-10-09 DIAGNOSIS — E559 Vitamin D deficiency, unspecified: Secondary | ICD-10-CM | POA: Diagnosis not present

## 2023-10-09 LAB — VITAMIN D 25 HYDROXY (VIT D DEFICIENCY, FRACTURES): VITD: 52.57 ng/mL (ref 30.00–100.00)

## 2023-10-12 ENCOUNTER — Encounter: Payer: Self-pay | Admitting: *Deleted

## 2024-01-04 ENCOUNTER — Other Ambulatory Visit: Payer: Self-pay | Admitting: Internal Medicine

## 2024-01-04 DIAGNOSIS — Z1231 Encounter for screening mammogram for malignant neoplasm of breast: Secondary | ICD-10-CM

## 2024-01-15 ENCOUNTER — Ambulatory Visit
Admission: RE | Admit: 2024-01-15 | Discharge: 2024-01-15 | Disposition: A | Discharge status: Home / Self Care | Source: Ambulatory Visit | Attending: Internal Medicine | Admitting: Internal Medicine

## 2024-01-15 DIAGNOSIS — Z1231 Encounter for screening mammogram for malignant neoplasm of breast: Secondary | ICD-10-CM | POA: Diagnosis not present

## 2024-02-05 DIAGNOSIS — D225 Melanocytic nevi of trunk: Secondary | ICD-10-CM | POA: Diagnosis not present

## 2024-02-05 DIAGNOSIS — L821 Other seborrheic keratosis: Secondary | ICD-10-CM | POA: Diagnosis not present

## 2024-02-05 DIAGNOSIS — L570 Actinic keratosis: Secondary | ICD-10-CM | POA: Diagnosis not present

## 2024-02-05 DIAGNOSIS — X32XXXA Exposure to sunlight, initial encounter: Secondary | ICD-10-CM | POA: Diagnosis not present

## 2024-04-14 ENCOUNTER — Other Ambulatory Visit: Payer: Self-pay | Admitting: Internal Medicine

## 2024-04-14 DIAGNOSIS — K5720 Diverticulitis of large intestine with perforation and abscess without bleeding: Secondary | ICD-10-CM

## 2024-04-20 ENCOUNTER — Ambulatory Visit (INDEPENDENT_AMBULATORY_CARE_PROVIDER_SITE_OTHER)

## 2024-04-20 DIAGNOSIS — Z23 Encounter for immunization: Secondary | ICD-10-CM

## 2024-06-30 ENCOUNTER — Ambulatory Visit (INDEPENDENT_AMBULATORY_CARE_PROVIDER_SITE_OTHER): Admitting: Family Medicine

## 2024-06-30 DIAGNOSIS — Z0000 Encounter for general adult medical examination without abnormal findings: Secondary | ICD-10-CM | POA: Diagnosis not present

## 2024-06-30 NOTE — Patient Instructions (Signed)
 I really enjoyed getting to talk with you today! I am available on Tuesdays and Thursdays for virtual visits if you have any questions or concerns, or if I can be of any further assistance.   CHECKLIST FROM ANNUAL WELLNESS VISIT:  -Follow up (please call to schedule if not scheduled after visit):   -yearly for annual wellness visit with primary care office  Here is a list of your preventive care/health maintenance measures and the plan for each if any are due:  PLAN For any measures below that may be due:    1. Please let us  know if you decide to do the bone density test.   2. If you get the vaccines at the pharmacy - please let us  know so that we can update your record.   3. Please let us  know if you want to do the hepatitis c screening.  Health Maintenance  Topic Date Due   Hepatitis C Screening  Never done   Zoster Vaccines- Shingrix (1 of 2) Never done   DTaP/Tdap/Td (2 - Td or Tdap) 02/02/2024   COVID-19 Vaccine (5 - 2025-26 season) 03/21/2024   Mammogram  01/14/2025   Medicare Annual Wellness (AWV)  06/30/2025   Colonoscopy  01/25/2030   Pneumococcal Vaccine: 50+ Years  Completed   Influenza Vaccine  Completed   Bone Density Scan  Completed   Meningococcal B Vaccine  Aged Out    -See a dentist at least yearly  -Get your eyes checked and then per your eye specialist's recommendations  -Other issues addressed today:   -I have included below further information regarding a healthy whole foods based diet, physical activity guidelines for adults, stress management and opportunities for social connections. I hope you find this information useful.   -----------------------------------------------------------------------------------------------------------------------------------------------------------------------------------------------------------------------------------------------------------    NUTRITION: -eat real food: lots of colorful vegetables (half the plate)  and fruits -5-7 servings of vegetables and fruits per day (fresh or steamed is best), exp. 2 servings of vegetables with lunch and dinner and 2 servings of fruit per day. Berries and greens such as kale and collards are great choices.  -consume on a regular basis:  fresh fruits, fresh veggies, fish, nuts, seeds, healthy oils (such as olive oil, avocado oil), whole grains (make sure for bread/pasta/crackers/etc., that the first ingredient on label contains the word whole), legumes. -can eat small amounts of dairy and lean meat (no larger than the palm of your hand), but avoid processed meats such as ham, bacon, lunch meat, etc. -drink water  -try to avoid fast food and pre-packaged foods, processed meat, ultra processed foods/beverages (donuts, candy, etc.) -most experts advise limiting sodium to < 2300mg  per day, should limit further is any chronic conditions such as high blood pressure, heart disease, diabetes, etc. The American Heart Association advised that < 1500mg  is is ideal -try to avoid foods/beverages that contain any ingredients with names you do not recognize  -try to avoid foods/beverages  with added sugar or sweeteners/sweets  -try to avoid sweet drinks (including diet drinks): soda, juice, Gatorade, sweet tea, power drinks, diet drinks -try to avoid white rice, white bread, pasta (unless whole grain)  EXERCISE GUIDELINES FOR ADULTS: -if you wish to increase your physical activity, do so gradually and with the approval of your doctor -STOP and seek medical care immediately if you have any chest pain, chest discomfort or trouble breathing when starting or increasing exercise  -move and stretch your body, legs, feet and arms when sitting for long periods -Physical activity guidelines  for optimal health in adults: -get at least 150 minutes per week of moderate exercise (can talk, but not sing); this is about 20-30 minutes of sustained activity 5-7 days per week or two 10-15 minute  episodes of sustained activity 5-7 days per week -do some muscle building/resistance training/strength training at least 2 days per week  -balance exercises 3+ days per week:   Stand somewhere where you have something sturdy to hold onto if you lose balance    1) lift up on toes, then back down, start with 5x per day and work up to 20x   2) stand and lift one leg straight out to the side so that foot is a few inches of the floor, start with 5x each side and work up to 20x each side   3) stand on one foot, start with 5 seconds each side and work up to 20 seconds on each side  If you need ideas or help with getting more active:  -Silver sneakers https://tools.silversneakers.com  -Walk with a Doc: Http://www.duncan-williams.com/  -try to include resistance (weight lifting/strength building) and balance exercises twice per week: or the following link for ideas: http://castillo-powell.com/  buyducts.dk  STRESS MANAGEMENT: -can try meditating, or just sitting quietly with deep breathing while intentionally relaxing all parts of your body for 5 minutes daily -if you need further help with stress, anxiety or depression please follow up with your primary doctor or contact the wonderful folks at [facility]: [phone]  SOCIAL CONNECTIONS: -options in [place] if you wish to engage in more social and exercise related activities:  -Silver sneakers https://tools.silversneakers.com  -Walk with a Doc: Http://www.duncan-williams.com/  -Check out the [facility] Active Adults 50+ section on the [place] of [employer] (hiking clubs, book clubs, cards and games, chess, exercise classes, aquatic classes and much more) - see the website for details: https://www.[place].gov/departments/parks-recreation/active-adults50  -YouTube has lots of exercise videos for different ages and abilities as  well  -[facility] (a variety of indoor and outdoor inperson activities for adults). [phone]. [address].  -Virtual Online Classes (a variety of topics): see seniorplanet.org or call ([phone]  -consider volunteering at a school, hospice center, church, senior center or elsewhere          ADVANCED HEALTHCARE DIRECTIVES:  [place] Advanced Directives assistance:   expressweek.com.cy  Everyone should have advanced health care directives in place. This is so that you get the care you want, should you ever be in a situation where you are unable to make your own medical decisions.   From the [place] Advanced Directive Website: Advance Health Care Directives are legal documents in which you give written instructions about your health care if, in the future, you cannot speak for yourself.   A health care power of attorney allows you to name a person you trust to make your health care decisions if you cannot make them yourself. A declaration of a desire for a natural death (or living will) is document, which states that you desire not to have your life prolonged by extraordinary measures if you have a terminal or incurable illness or if you are in a vegetative state. An advance instruction for mental health treatment makes a declaration of instructions, information and preferences regarding your mental health treatment. It also states that you are aware that the advance instruction authorizes a mental health treatment provider to act according to your wishes. It may also outline your consent or refusal of mental health treatment. A declaration of  an anatomical gift allows anyone over the age of 37 to make a gift by will, organ donor card or other document.   Please see the following website or an elder law attorney for forms, FAQs and for completion of advanced directives: [place]  [employer]er, [employer] Health Care Directives Advance Health Care Directives (http://guzman.com/)  Or copy and paste the following to your web browser: Poshchat.fi

## 2024-06-30 NOTE — Progress Notes (Signed)
 Method of visit: Not In Person ----------------------------------------------------------------------------------------------------------------------------------------------------------------------------------------------------------------------  Because this visit was a virtual/telehealth visit, some criteria may be missing or patient reported. Any vitals not documented were not able to be obtained and vitals that have been documented are patient reported.    MEDICARE ANNUAL PREVENTIVE VISIT WITH PROVIDER: (Welcome to Medicare, initial annual wellness or annual wellness exam)  Virtual Visit via Video Note  I connected with [name] on 06/30/2024 by  a video enabled telemedicine application and verified that I am speaking with the correct person using two identifiers. Continued video connection from husband's appt which was right before her's.  Location patient: home Location provider:work or home office Persons participating in the virtual visit: patient, provider  Concerns and/or follow up today: detailed intake and risks/health assessment completed in flowsheets and below - please see for details.  Stable.   How often do you have a drink containing alcohol ?n How many drinks containing alcohol  do you have on a typical day when you are drinking?n How often do you have six or more drinks on one occasion?na Have you ever smoked?n Quit date if applicable? na  How many packs a day do/did you smoke? na Do you use smokeless tobacco?n Do you use an illicit drugs?n Do you feel safe at home?y Last dentist visit?no teeth Last eye Exam and location?goes every few year   See HM section in Epic for other details of completed HM.    ROS: negative for report of fevers, unintentional weight loss, vision changes, vision loss, hearing loss or change, chest pain, sob, hemoptysis, melena, hematochezia, hematuria or bleeding or bruising  Patient-completed extensive health risk assessment -  reviewed and discussed with the patient: See Health Risk Assessment completed with patient prior to the visit either above or in recent phone note. This was reviewed in detailed with the patient today and appropriate recommendations, orders and referrals were placed as needed per Summary below and patient instructions.   Review of Medical History: -PMH, PSH, Family History and current specialty and care providers reviewed and updated and listed below   Patient Care Team: Theophilus Andrews, Tully GRADE, MD as PCP - General (Internal Medicine) Avram Lupita BRAVO, MD as Consulting Physician (Gastroenterology) Liane Sharyne MATSU, [facility] (Inactive) as Pharmacist (Pharmacist)   Past Medical History:  Diagnosis Date   Alcoholism [facility]) 02/02/2009   ANXIETY 01/21/2007   Arthritis    Blood transfusion    hx of 2005    Cognitive communication disorder    per husband has some cognitive issues    Depression    Diverticulitis    Diverticulitis of large intestine with abscess 04/05/2019   DIVERTICULOSIS 07/09/2007   Duodenal ulcer hemorrhage perforated 2006   GERD (gastroesophageal reflux disease)    Headache(784.0) 05/31/2007   pt denies at 08/12/11 preop visit    HYPERTENSION 06/23/2007   HYPONATREMIA 02/02/2009   better off diuretic   INSOMNIA 05/31/2007   Iron  deficiency anemia    pt denies at 08/12/11 visit    Marginal ulcer, at gastrojejunostomy, resected 08/14/2011. 09/19/2011   NSAID-induced duodenal ulcer    OSTEOPOROSIS 01/21/2007   Raynaud's syndrome 06/23/2007   Recurrent upper respiratory infection (URI)    pt reports slight cold using Nyquil prn at bedtime    Urinary tract infection 11/23/2019   VITAMIN B12 DEFICIENCY 10/02/2009   Zoster 2012   twice - left groin/vagina    Past Surgical History:  Procedure Laterality Date   COLECTOMY N/A 03/09/2020   Procedure:  OPEN SIGMOID COLECTOMY WITH RIGID PROCTOSCOPY;  Surgeon: Debby Hila, MD;  Location: [facility];  Service: General;  Laterality: N/A;    COLONOSCOPY  06/18/2007   angulated and stenotic sigmoid colon with severe sigmoid diverticulosis   COLONOSCOPY WITH PROPOFOL  N/A 01/26/2020   Procedure: COLONOSCOPY WITH PROPOFOL ;  Surgeon: Teressa Toribio SQUIBB, MD;  Location: [facility];  Service: Endoscopy;  Laterality: N/A;  ultraslim scope   CYSTOSCOPY WITH STENT PLACEMENT Bilateral 03/09/2020   Procedure: CYSTOSCOPY WITH BILATERAL OPEN ENDED CATHETER PLACEMENT;  Surgeon: Carolee Sherwood JONETTA DOUGLAS, MD;  Location: [facility];  Service: Urology;  Laterality: Bilateral;   EXPLORATORY LAPAROTOMY  08/03/2004   1) duodenal ulcer oversew, pyloroplasty, anterior, posterior and truncal vagotomies   EXPLORATORY LAPAROTOMY  10/20/2004   1) duodenal ulcer oversew  with exclusion2) side-side gastrojejunostomy 3) feeding jejunostomy   IR RADIOLOGIST EVAL & MGMT  04/26/2019   IR RADIOLOGIST EVAL & MGMT  05/10/2019   IR RADIOLOGIST EVAL & MGMT  06/07/2019   IR RADIOLOGIST EVAL & MGMT  12/06/2019   IR RADIOLOGIST EVAL & MGMT  12/22/2019   IR RADIOLOGIST EVAL & MGMT  01/10/2020   IR RADIOLOGIST EVAL & MGMT  01/31/2020   IR SINUS/FIST TUBE CHK-NON GI  04/12/2019   LAPAROSCOPY  08/14/2011   Procedure: LAPAROSCOPY DIAGNOSTIC;  Surgeon: Alm VEAR Angle, MD;  Location: [facility];  Service: General;  Laterality: N/A;   LAPAROTOMY  08/14/2011   Procedure: EXPLORATORY LAPAROTOMY;  Surgeon: Alm VEAR Angle, MD;  Location: [facility];  Service: General;  Laterality: N/A;  exploratory laparotomy, lysis of adhesions, revision gastrojejunostomy, upper endoscopy   OPEN REDUCTION INTERNAL FIXATION (ORIF) DISTAL RADIAL FRACTURE Right 05/01/2016   Procedure: OPEN REDUCTION INTERNAL FIXATION (ORIF) DISTAL RADIAL FRACTURE, right;  Surgeon: Franky Curia, MD;  Location: [facility];  Service: Orthopedics;  Laterality: Right;   TUBAL LIGATION  1977   UPPER GASTROINTESTINAL ENDOSCOPY  09/16/2007; 12/21/2008; 03/17/2011   2009: Normal 2010: Normal with enteroscopy2012: Gastrojejunal  anastomotic ulcer,  gastric retention    Social History   Socioeconomic History   Marital status: Married    Spouse name: Not on file   Number of children: 1   Years of education: 13   Highest education level: Not on file  Occupational History   Occupation: retired    Associate Professor: UNEMPLOYED  Tobacco Use   Smoking status: Former    Current packs/day: 0.00    Types: Cigarettes    Quit date: 07/21/2014    Years since quitting: 9.9   Smokeless tobacco: Never  Vaping Use   Vaping status: Never Used  Substance and Sexual Activity   Alcohol  use: Not Currently    Alcohol /week: 7.0 standard drinks of alcohol     Types: 7 Cans of beer per week    Comment: stopped around 2020   Drug use: No   Sexual activity: Not on file  Other Topics Concern   Not on file  Social History Narrative   Married retired 1 child   Beer drinker 1 a day, no tobacco no drug use   Social Drivers of Health   Tobacco Use: Medium Risk (10/05/2023)   Patient History    Smoking Tobacco Use: Former    Smokeless Tobacco Use: Never    Passive Exposure: Not on file  Financial Resource Strain: Low Risk (07/02/2023)   Overall Financial Resource Strain (CARDIA)    Difficulty of Paying Living Expenses: Not hard at all  Food Insecurity: No Food Insecurity (07/02/2023)  Hunger Vital Sign    Worried About Running Out of Food in the Last Year: Never true    Ran Out of Food in the Last Year: Never true  Transportation Needs: No Transportation Needs (07/02/2023)   PRAPARE - Administrator, Civil Service (Medical): No    Lack of Transportation (Non-Medical): No  Physical Activity: Sufficiently Active (06/30/2024)   Exercise Vital Sign    Days of Exercise per Week: 7 days    Minutes of Exercise per Session: 60 min  Stress: No Stress Concern Present (06/30/2024)   [employer] of Occupational Health - Occupational Stress Questionnaire    Feeling of Stress: Not at all  Social Connections: Moderately Integrated (06/30/2024)    Social Connection and Isolation Panel    Frequency of Communication with Friends and Family: More than three times a week    Frequency of Social Gatherings with Friends and Family: More than three times a week    Attends Religious Services: More than 4 times per year    Active Member of [employer] or Organizations: No    Attends Banker Meetings: Never    Marital Status: Married  Catering Manager Violence: Not At Risk (07/02/2023)   Humiliation, Afraid, Rape, and Kick questionnaire    Fear of Current or Ex-Partner: No    Emotionally Abused: No    Physically Abused: No    Sexually Abused: No  Depression (PHQ2-9): Low Risk (06/30/2024)   Depression (PHQ2-9)    PHQ-2 Score: 0  Alcohol  Screen: Low Risk (07/02/2023)   Alcohol  Screen    Last Alcohol  Screening Score (AUDIT): 0  Housing: Unknown (07/02/2023)   Housing Stability Vital Sign    Unable to Pay for Housing in the Last Year: No    Number of Times Moved in the Last Year: Not on file    Homeless in the Last Year: No  Utilities: Not At Risk (07/02/2023)   AHC Utilities    Threatened with loss of utilities: No  Health Literacy: Inadequate Health Literacy (07/02/2023)   B1300 Health Literacy    Frequency of need for help with medical instructions: Often    Family History  Problem Relation Age of Onset   Breast cancer Mother    Stroke Father    Colon cancer Neg Hx    Esophageal cancer Neg Hx    Pancreatic cancer Neg Hx    Liver disease Neg Hx    Colon polyps Neg Hx     Medications Ordered Prior to Encounter[1]  Allergies[2]     Physical Exam Vitals requested from patient and listed below if patient had equipment and was able to obtain at home for this virtual visit: There were no vitals filed for this visit. Estimated body mass index is 23.4 kg/m as calculated from the following:   Height as of 06/02/22: 5' 2.5 (1.588 m).   Weight as of 10/05/23: 130 lb (59 kg).  EKG (optional): deferred due to virtual  visit  GENERAL: alert, oriented, no acute distress detected, full vision exam deferred due to pandemic and/or virtual encounter  HEENT: atraumatic, conjunttiva clear, no obvious abnormalities on inspection of external nose and ears  NECK: normal movements of the head and neck  LUNGS: on inspection no signs of respiratory distress, breathing rate appears normal, no obvious gross SOB, gasping or wheezing  CV: no obvious cyanosis  MS: moves all visible extremities without noticeable abnormality  PSYCH/NEURO: pleasant and cooperative, no obvious depression or anxiety, speech and  thought processing grossly intact, Cognitive function grossly intact  Flowsheet Row Office Visit from 07/02/2023 in [facility] [place]  PHQ-9 Total Score 0        06/30/2024    9:52 AM 10/05/2023    8:32 AM 07/02/2023    1:11 PM 07/02/2023    1:06 PM 05/11/2023    4:13 PM  Depression screen PHQ 2/9  Decreased Interest 0 0 0 0 0  Down, Depressed, Hopeless 0 0 0 0 0  PHQ - 2 Score 0 0 0 0 0  Altered sleeping   0  0  Tired, decreased energy   0  0  Change in appetite   0  0  Feeling bad or failure about yourself    0  0  Trouble concentrating   0  3  Moving slowly or fidgety/restless   0  1  Suicidal thoughts   0  0  PHQ-9 Score   0   4      Data saved with a previous flowsheet row definition       06/02/2022    9:06 AM 05/11/2023    4:12 PM 07/02/2023    1:06 PM 10/05/2023    8:32 AM 06/30/2024    9:43 AM  Fall Risk  Falls in the past year? 0 0 0 0 0  Was there an injury with Fall? 0  0  0  0  0  Fall Risk Category Calculator 0 0 0 0 0  Fall Risk Category (Retired) Low       (RETIRED) Patient Fall Risk Level Low fall risk       Patient at Risk for Falls Due to No Fall Risks    No Fall Risks  Fall risk Follow up Falls evaluation completed  Falls evaluation completed Falls evaluation completed       Data saved with a previous flowsheet row definition     SUMMARY  AND PLAN:  Medicare annual wellness visit, subsequent   Discussed applicable health maintenance/preventive health measures and advised and referred or ordered per patient preferences: -discussed bone density due, declined -discussed vaccines due recs/risks, advised if they get at the pharmacy to let us  know so that we can update the record -advised on hep c screening Health Maintenance  Topic Date Due   Hepatitis C Screening  Never done   Zoster Vaccines- Shingrix (1 of 2) Never done   DTaP/Tdap/Td (2 - Td or Tdap) 02/02/2024   COVID-19 Vaccine (5 - 2025-26 season) 03/21/2024   Mammogram  01/14/2025   Medicare Annual Wellness (AWV)  06/30/2025   Colonoscopy  01/25/2030   Pneumococcal Vaccine: 50+ Years  Completed   Influenza Vaccine  Completed   Bone Density Scan  Completed   Meningococcal B Vaccine  Aged Out      Education and counseling on the following was provided based on the above review of health and a plan/checklist for the patient, along with additional information discussed, was provided for the patient in the patient instructions :  -Advised on importance of completing advanced directives, discussed options for completing and provided information in patient instructions as well -Advised and counseled on a healthy lifestyle - including the importance of a healthy diet, regular physical activity, social connections and stress management. -Reviewed patient's current diet. Advised and counseled on a whole foods based healthy diet. A summary of a healthy diet was provided in the Patient Instructions.  -reviewed patient's current physical activity level and  discussed exercise guidelines for adults. Discussed community resources and ideas for safe exercise at home to assist in meeting exercise guideline recommendations in a safe and healthy way.  -Advise yearly dental visits at minimum and regular eye exams   Follow up: see patient instructions     Patient Instructions  I  really enjoyed getting to talk with you today! I am available on Tuesdays and Thursdays for virtual visits if you have any questions or concerns, or if I can be of any further assistance.   CHECKLIST FROM ANNUAL WELLNESS VISIT:  -Follow up (please call to schedule if not scheduled after visit):   -yearly for annual wellness visit with primary care office  Here is a list of your preventive care/health maintenance measures and the plan for each if any are due:  PLAN For any measures below that may be due:    1. Please let us  know if you decide to do the bone density test.   2. If you get the vaccines at the pharmacy - please let us  know so that we can update your record.   3. Please let us  know if you want to do the hepatitis c screening.  Health Maintenance  Topic Date Due   Hepatitis C Screening  Never done   Zoster Vaccines- Shingrix (1 of 2) Never done   DTaP/Tdap/Td (2 - Td or Tdap) 02/02/2024   COVID-19 Vaccine (5 - 2025-26 season) 03/21/2024   Mammogram  01/14/2025   Medicare Annual Wellness (AWV)  06/30/2025   Colonoscopy  01/25/2030   Pneumococcal Vaccine: 50+ Years  Completed   Influenza Vaccine  Completed   Bone Density Scan  Completed   Meningococcal B Vaccine  Aged Out    -See a dentist at least yearly  -Get your eyes checked and then per your eye specialist's recommendations  -Other issues addressed today:   -I have included below further information regarding a healthy whole foods based diet, physical activity guidelines for adults, stress management and opportunities for social connections. I hope you find this information useful.   -----------------------------------------------------------------------------------------------------------------------------------------------------------------------------------------------------------------------------------------------------------    NUTRITION: -eat real food: lots of colorful vegetables (half the plate)  and fruits -5-7 servings of vegetables and fruits per day (fresh or steamed is best), exp. 2 servings of vegetables with lunch and dinner and 2 servings of fruit per day. Berries and greens such as kale and collards are great choices.  -consume on a regular basis:  fresh fruits, fresh veggies, fish, nuts, seeds, healthy oils (such as olive oil, avocado oil), whole grains (make sure for bread/pasta/crackers/etc., that the first ingredient on label contains the word whole), legumes. -can eat small amounts of dairy and lean meat (no larger than the palm of your hand), but avoid processed meats such as ham, bacon, lunch meat, etc. -drink water  -try to avoid fast food and pre-packaged foods, processed meat, ultra processed foods/beverages (donuts, candy, etc.) -most experts advise limiting sodium to < 2300mg  per day, should limit further is any chronic conditions such as high blood pressure, heart disease, diabetes, etc. The American Heart Association advised that < 1500mg  is is ideal -try to avoid foods/beverages that contain any ingredients with names you do not recognize  -try to avoid foods/beverages  with added sugar or sweeteners/sweets  -try to avoid sweet drinks (including diet drinks): soda, juice, Gatorade, sweet tea, power drinks, diet drinks -try to avoid white rice, white bread, pasta (unless whole grain)  EXERCISE GUIDELINES FOR ADULTS: -if you wish to  increase your physical activity, do so gradually and with the approval of your doctor -STOP and seek medical care immediately if you have any chest pain, chest discomfort or trouble breathing when starting or increasing exercise  -move and stretch your body, legs, feet and arms when sitting for long periods -Physical activity guidelines for optimal health in adults: -get at least 150 minutes per week of moderate exercise (can talk, but not sing); this is about 20-30 minutes of sustained activity 5-7 days per week or two 10-15 minute  episodes of sustained activity 5-7 days per week -do some muscle building/resistance training/strength training at least 2 days per week  -balance exercises 3+ days per week:   Stand somewhere where you have something sturdy to hold onto if you lose balance    1) lift up on toes, then back down, start with 5x per day and work up to 20x   2) stand and lift one leg straight out to the side so that foot is a few inches of the floor, start with 5x each side and work up to 20x each side   3) stand on one foot, start with 5 seconds each side and work up to 20 seconds on each side  If you need ideas or help with getting more active:  -Silver sneakers https://tools.silversneakers.com  -Walk with a Doc: Http://www.duncan-williams.com/  -try to include resistance (weight lifting/strength building) and balance exercises twice per week: or the following link for ideas: http://castillo-powell.com/  buyducts.dk  STRESS MANAGEMENT: -can try meditating, or just sitting quietly with deep breathing while intentionally relaxing all parts of your body for 5 minutes daily -if you need further help with stress, anxiety or depression please follow up with your primary doctor or contact the wonderful folks at [facility]: [phone]  SOCIAL CONNECTIONS: -options in [place] if you wish to engage in more social and exercise related activities:  -Silver sneakers https://tools.silversneakers.com  -Walk with a Doc: Http://www.duncan-williams.com/  -Check out the [facility] Active Adults 50+ section on the [place] of [employer] (hiking clubs, book clubs, cards and games, chess, exercise classes, aquatic classes and much more) - see the website for details: https://www.[place].gov/departments/parks-recreation/active-adults50  -YouTube has lots of exercise videos for different ages and abilities as  well  -[facility] (a variety of indoor and outdoor inperson activities for adults). [phone]. [address]  -Virtual Online Classes (a variety of topics): see seniorplanet.org or call [phone]  -consider volunteering at a school, hospice center, church, senior center or elsewhere          ADVANCED HEALTHCARE DIRECTIVES:  [place] Advanced Directives assistance:   expressweek.com.cy  Everyone should have advanced health care directives in place. This is so that you get the care you want, should you ever be in a situation where you are unable to make your own medical decisions.   From the [place] Advanced Directive Website: Advance Health Care Directives are legal documents in which you give written instructions about your health care if, in the future, you cannot speak for yourself.   A health care power of attorney allows you to name a person you trust to make your health care decisions if you cannot make them yourself. A declaration of a desire for a natural death (or living will) is document, which states that you desire not to have your life prolonged by extraordinary measures if you have a terminal or incurable illness or if you are in a vegetative state. An advance instruction for  mental health treatment makes a declaration of instructions, information and preferences regarding your mental health treatment. It also states that you are aware that the advance instruction authorizes a mental health treatment provider to act according to your wishes. It may also outline your consent or refusal of mental health treatment. A declaration of an anatomical gift allows anyone over the age of 58 to make a gift by will, organ donor card or other document.   Please see the following website or an elder law attorney for forms, FAQs and for completion of advanced directives: [place]  [employer]er, [employer] Health Care Directives Advance Health Care Directives (http://guzman.com/)  Or copy and paste the following to your web browser: Poshchat.fi    Chiquita JONELLE Cramp, DO      [1]  Current Outpatient Medications on File Prior to Visit  Medication Sig Dispense Refill   acetaminophen  (TYLENOL ) 500 MG tablet Take 500 mg by mouth every 6 (six) hours as needed.     calcium  carbonate (OS-CAL - DOSED IN MG OF ELEMENTAL CALCIUM ) 1250 (500 Ca) MG tablet Take 1 tablet by mouth daily. 600 mg with vitamin D  800 units     denosumab  (PROLIA ) 60 MG/ML SOSY injection Inject 60 mg into the skin every 6 (six) months.     folic acid  (FOLVITE ) 1 MG tablet TAKE 1 TABLET BY MOUTH EVERY DAY 90 tablet 1   gabapentin  (NEURONTIN ) 100 MG capsule TAKE 1 CAPSULE BY MOUTH TWICE A DAY 180 capsule 1   Multiple Vitamin (MULTIVITAMIN WITH MINERALS) TABS tablet Take 1 tablet by mouth daily. Centrum Silver - 300 mg of calcium      ondansetron  (ZOFRAN ) 4 MG tablet Take 1 tablet (4 mg total) by mouth every 4 (four) hours as needed for nausea or vomiting. 12 tablet 0   pantoprazole  (PROTONIX ) 40 MG tablet TAKE 1 TABLET BY MOUTH EVERY DAY 90 tablet 1   sucralfate  (CARAFATE ) 1 g tablet Take 1 tablet (1 g total) by mouth with breakfast, with lunch, and with evening meal for 7 days. 21 tablet 0   thiamine  100 MG tablet Take 1 tablet (100 mg total) by mouth daily. 30 tablet 2   No current facility-administered medications on file prior to visit.  [2]  Allergies Allergen Reactions   Sulfa Antibiotics Anaphylaxis and Rash   Bee Venom Swelling   Dilaudid [Hydromorphone Hcl] Hives   Penicillins Other (See Comments)    Has patient had a PCN reaction causing immediate rash, facial/tongue/throat swelling, SOB or lightheadedness with hypotension: No Has patient had a PCN reaction causing severe rash involving mucus membranes or skin necrosis: No Has patient had a PCN reaction that required hospitalization No Has patient had a  PCN reaction occurring within the last 10 years: No If all of the above answers are NO, then may proceed with Cephalosporin use.
